# Patient Record
Sex: Male | Born: 1935
Health system: Southern US, Community
[De-identification: ages and names within clinical notes are randomized; demographics above are authoritative.]

## PROBLEM LIST (undated history)

## (undated) DIAGNOSIS — Z87442 Personal history of urinary calculi: Secondary | ICD-10-CM

## (undated) DIAGNOSIS — R51 Headache: Secondary | ICD-10-CM

## (undated) DIAGNOSIS — I2510 Atherosclerotic heart disease of native coronary artery without angina pectoris: Secondary | ICD-10-CM

## (undated) DIAGNOSIS — E119 Type 2 diabetes mellitus without complications: Secondary | ICD-10-CM

## (undated) DIAGNOSIS — Z8249 Family history of ischemic heart disease and other diseases of the circulatory system: Secondary | ICD-10-CM

## (undated) DIAGNOSIS — I4892 Unspecified atrial flutter: Secondary | ICD-10-CM

## (undated) DIAGNOSIS — B029 Zoster without complications: Secondary | ICD-10-CM

## (undated) DIAGNOSIS — R42 Dizziness and giddiness: Secondary | ICD-10-CM

## (undated) DIAGNOSIS — I10 Essential (primary) hypertension: Secondary | ICD-10-CM

## (undated) DIAGNOSIS — M19049 Primary osteoarthritis, unspecified hand: Secondary | ICD-10-CM

## (undated) DIAGNOSIS — K5730 Diverticulosis of large intestine without perforation or abscess without bleeding: Secondary | ICD-10-CM

## (undated) DIAGNOSIS — Z8711 Personal history of peptic ulcer disease: Secondary | ICD-10-CM

## (undated) DIAGNOSIS — C801 Malignant (primary) neoplasm, unspecified: Secondary | ICD-10-CM

## (undated) DIAGNOSIS — M1990 Unspecified osteoarthritis, unspecified site: Secondary | ICD-10-CM

## (undated) DIAGNOSIS — T7840XA Allergy, unspecified, initial encounter: Secondary | ICD-10-CM

## (undated) DIAGNOSIS — E785 Hyperlipidemia, unspecified: Secondary | ICD-10-CM

## (undated) DIAGNOSIS — M109 Gout, unspecified: Secondary | ICD-10-CM

## (undated) DIAGNOSIS — R234 Changes in skin texture: Secondary | ICD-10-CM

## (undated) DIAGNOSIS — IMO0002 Reserved for concepts with insufficient information to code with codable children: Secondary | ICD-10-CM

## (undated) DIAGNOSIS — J209 Acute bronchitis, unspecified: Secondary | ICD-10-CM

## (undated) DIAGNOSIS — I499 Cardiac arrhythmia, unspecified: Secondary | ICD-10-CM

## (undated) DIAGNOSIS — H902 Conductive hearing loss, unspecified: Secondary | ICD-10-CM

## (undated) HISTORY — DX: Primary osteoarthritis, unspecified hand: M19.049

## (undated) HISTORY — PX: EYE SURGERY: SHX253

## (undated) HISTORY — DX: Atherosclerotic heart disease of native coronary artery without angina pectoris: I25.10

## (undated) HISTORY — DX: Headache: R51

## (undated) HISTORY — PX: HERNIA REPAIR: SHX51

## (undated) HISTORY — PX: FRACTURE SURGERY: SHX138

## (undated) HISTORY — DX: Family history of ischemic heart disease and other diseases of the circulatory system: Z82.49

## (undated) HISTORY — DX: Acute bronchitis, unspecified: J20.9

## (undated) HISTORY — DX: Zoster without complications: B02.9

## (undated) HISTORY — PX: JOINT REPLACEMENT: SHX530

## (undated) HISTORY — DX: Reserved for concepts with insufficient information to code with codable children: IMO0002

## (undated) HISTORY — DX: Conductive hearing loss, unspecified: H90.2

## (undated) HISTORY — DX: Hyperlipidemia, unspecified: E78.5

## (undated) HISTORY — DX: Dizziness and giddiness: R42

## (undated) HISTORY — DX: Essential (primary) hypertension: I10

## (undated) HISTORY — DX: Unspecified osteoarthritis, unspecified site: M19.90

## (undated) HISTORY — PX: TOTAL KNEE ARTHROPLASTY: SHX125

## (undated) HISTORY — DX: Personal history of urinary calculi: Z87.442

## (undated) HISTORY — DX: Personal history of peptic ulcer disease: Z87.11

## (undated) HISTORY — DX: Allergy, unspecified, initial encounter: T78.40XA

## (undated) HISTORY — DX: Gout, unspecified: M10.9

## (undated) HISTORY — DX: Type 2 diabetes mellitus without complications: E11.9

## (undated) HISTORY — DX: Diverticulosis of large intestine without perforation or abscess without bleeding: K57.30

---
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
## 1969-09-11 HISTORY — PX: VASECTOMY: SHX75

## 1993-09-11 HISTORY — PX: KIDNEY STONE SURGERY: SHX686

## 2003-09-12 HISTORY — PX: CATARACT EXTRACTION: SUR2

## 2004-08-15 ENCOUNTER — Inpatient Hospital Stay (HOSPITAL_COMMUNITY): Admission: RE | Admit: 2004-08-15 | Discharge: 2004-08-19 | Payer: Self-pay | Admitting: Orthopedic Surgery

## 2004-09-13 ENCOUNTER — Ambulatory Visit: Payer: Self-pay | Admitting: Internal Medicine

## 2004-10-11 ENCOUNTER — Ambulatory Visit: Payer: Self-pay | Admitting: Internal Medicine

## 2004-11-07 ENCOUNTER — Ambulatory Visit (HOSPITAL_COMMUNITY): Admission: RE | Admit: 2004-11-07 | Discharge: 2004-11-07 | Payer: Self-pay | Admitting: Orthopedic Surgery

## 2005-01-05 ENCOUNTER — Ambulatory Visit: Payer: Self-pay | Admitting: Internal Medicine

## 2005-03-07 ENCOUNTER — Ambulatory Visit: Payer: Self-pay | Admitting: Internal Medicine

## 2005-04-26 IMAGING — CR DG CHEST 2V
2 series · 2 of 2 positions shown · non-contrast
Comparison: none

CLINICAL DATA: 77 year old male preoperative respiratory evaluation, right medial meniscal tear.  Prior history of smoking. 
DIAGNOSTIC CHEST ? TWO VIEWS:

[view not recorded (1 of 2)]
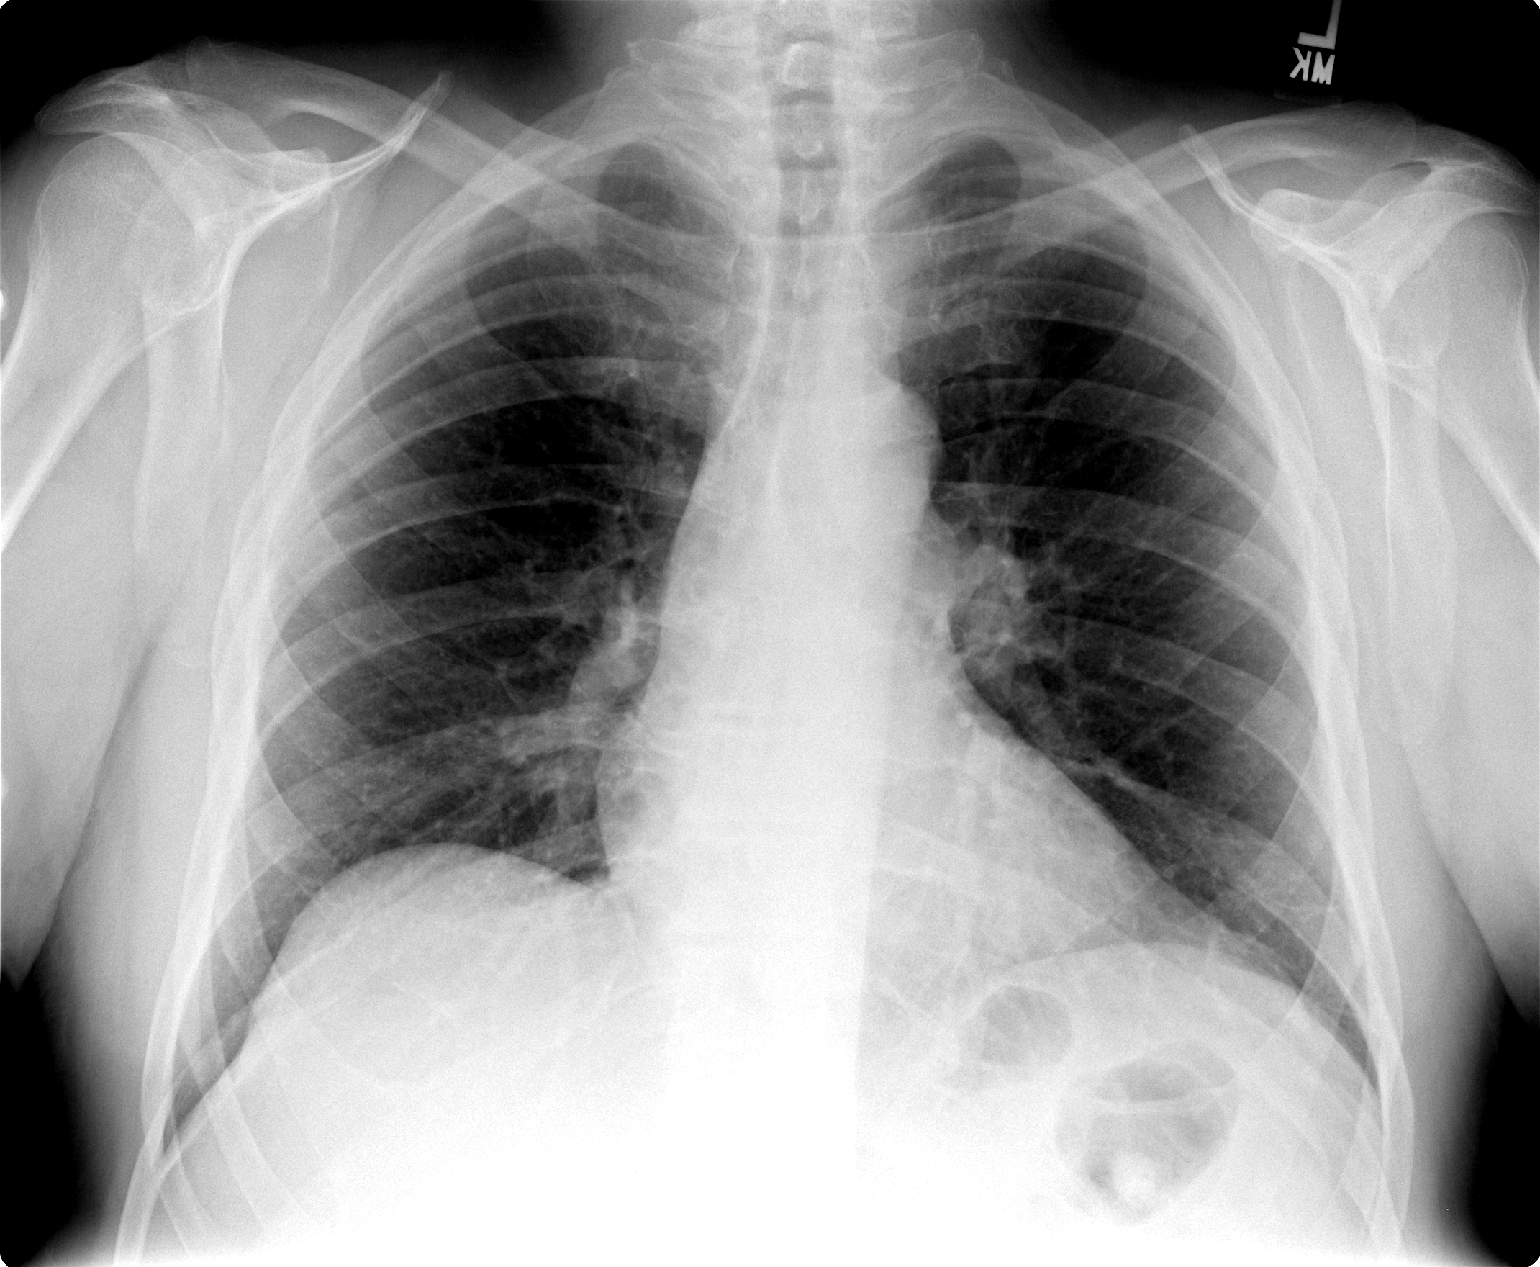

[view not recorded (2 of 2)]
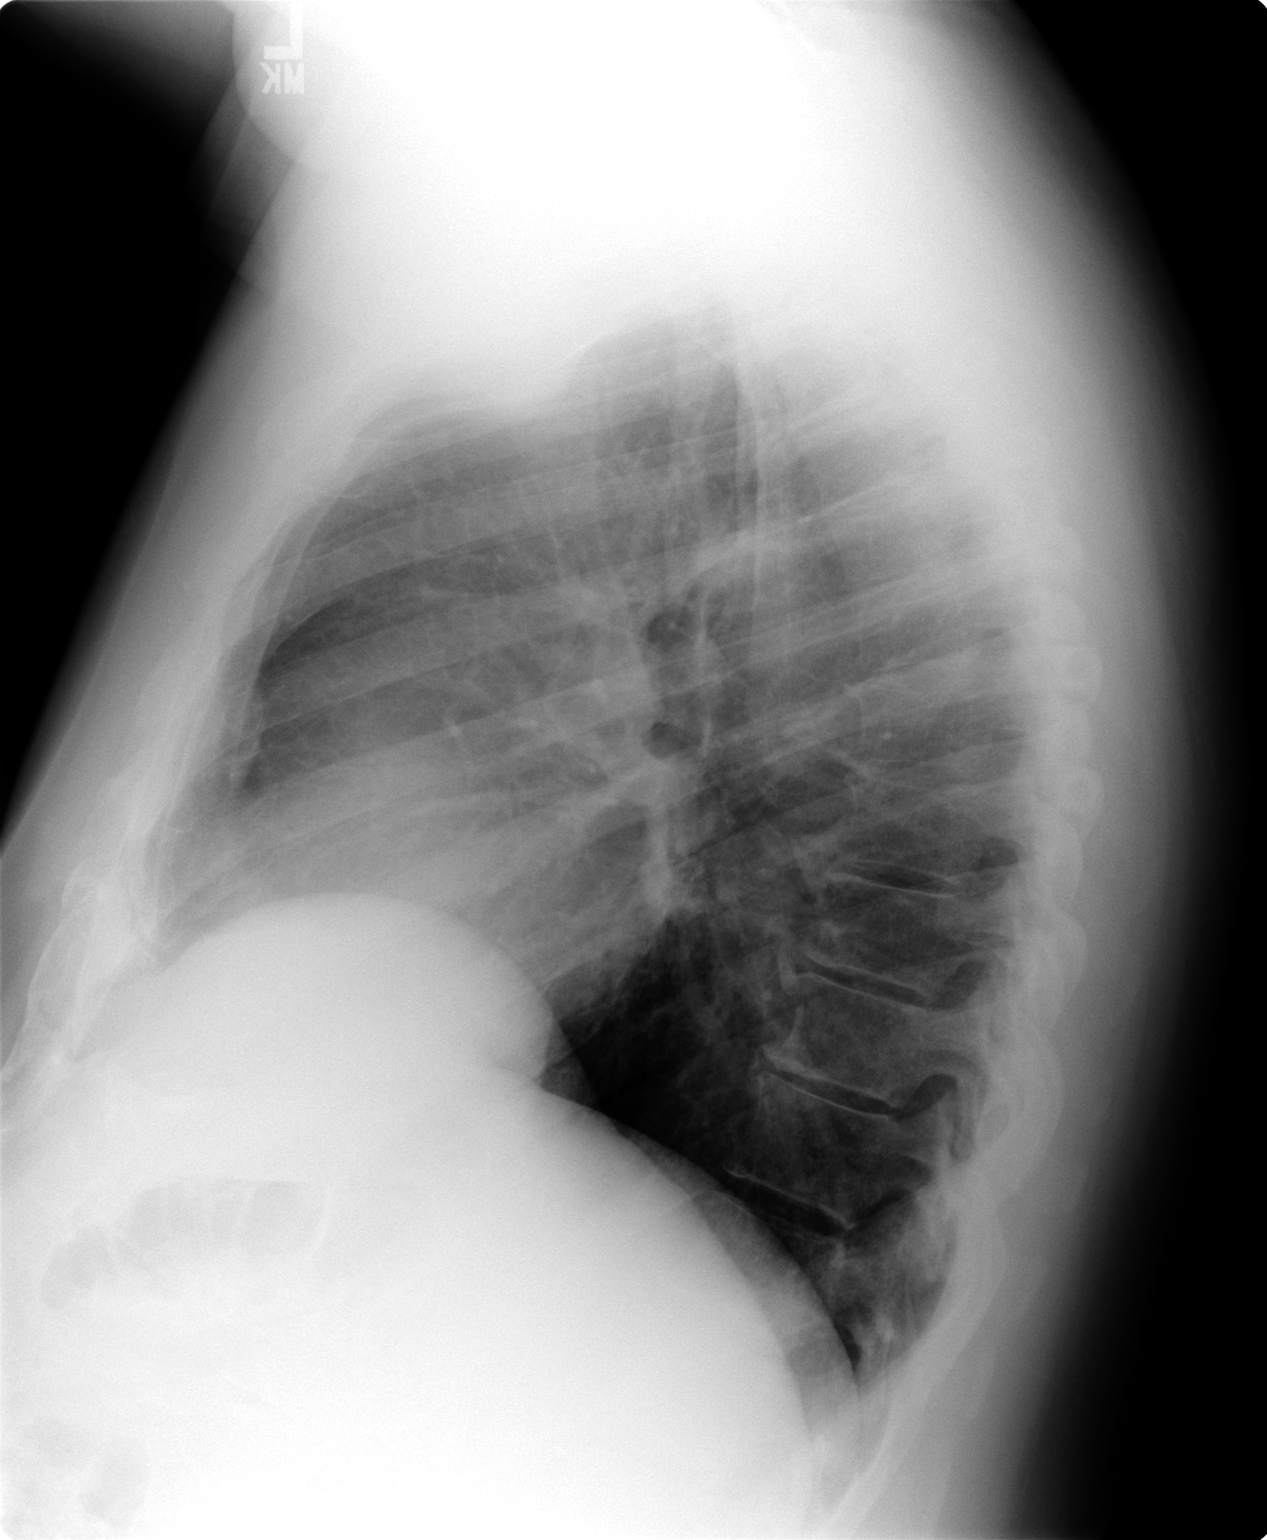

[2 of 2 positions shown; findings below may reference images not displayed]

FINDINGS: The heart size and mediastinal contours are within normal limits.  Both lungs are clear.  The visualized skeletal structures are unremarkable.
There is mild eventration anteriorly of the right hemidiaphragm.
IMPRESSION: No active cardiopulmonary disease.

## 2005-06-05 ENCOUNTER — Ambulatory Visit: Payer: Self-pay | Admitting: Internal Medicine

## 2005-06-09 ENCOUNTER — Ambulatory Visit: Payer: Self-pay | Admitting: Internal Medicine

## 2005-06-28 ENCOUNTER — Ambulatory Visit (HOSPITAL_COMMUNITY): Admission: RE | Admit: 2005-06-28 | Discharge: 2005-06-28 | Payer: Self-pay | Admitting: Ophthalmology

## 2005-07-07 ENCOUNTER — Ambulatory Visit: Payer: Self-pay | Admitting: Internal Medicine

## 2005-10-11 ENCOUNTER — Encounter: Admission: RE | Admit: 2005-10-11 | Discharge: 2005-10-11 | Payer: Self-pay | Admitting: Orthopedic Surgery

## 2005-10-13 ENCOUNTER — Ambulatory Visit (HOSPITAL_BASED_OUTPATIENT_CLINIC_OR_DEPARTMENT_OTHER): Admission: RE | Admit: 2005-10-13 | Discharge: 2005-10-13 | Payer: Self-pay | Admitting: Orthopedic Surgery

## 2005-10-24 ENCOUNTER — Ambulatory Visit: Payer: Self-pay | Admitting: Internal Medicine

## 2006-01-15 ENCOUNTER — Ambulatory Visit: Payer: Self-pay | Admitting: Internal Medicine

## 2006-04-16 ENCOUNTER — Ambulatory Visit: Payer: Self-pay | Admitting: Internal Medicine

## 2006-08-16 ENCOUNTER — Ambulatory Visit: Payer: Self-pay | Admitting: Internal Medicine

## 2006-11-15 ENCOUNTER — Ambulatory Visit: Payer: Self-pay | Admitting: Internal Medicine

## 2007-01-17 ENCOUNTER — Ambulatory Visit: Payer: Self-pay | Admitting: Internal Medicine

## 2007-04-02 DIAGNOSIS — R51 Headache: Secondary | ICD-10-CM

## 2007-04-02 DIAGNOSIS — M1710 Unilateral primary osteoarthritis, unspecified knee: Secondary | ICD-10-CM

## 2007-04-02 DIAGNOSIS — Z87442 Personal history of urinary calculi: Secondary | ICD-10-CM | POA: Insufficient documentation

## 2007-04-02 DIAGNOSIS — J309 Allergic rhinitis, unspecified: Secondary | ICD-10-CM | POA: Insufficient documentation

## 2007-04-02 DIAGNOSIS — I10 Essential (primary) hypertension: Secondary | ICD-10-CM

## 2007-04-02 DIAGNOSIS — E785 Hyperlipidemia, unspecified: Secondary | ICD-10-CM

## 2007-04-02 DIAGNOSIS — M179 Osteoarthritis of knee, unspecified: Secondary | ICD-10-CM | POA: Insufficient documentation

## 2007-04-02 DIAGNOSIS — R519 Headache, unspecified: Secondary | ICD-10-CM | POA: Insufficient documentation

## 2007-04-16 ENCOUNTER — Ambulatory Visit: Payer: Self-pay | Admitting: Internal Medicine

## 2007-04-16 DIAGNOSIS — H902 Conductive hearing loss, unspecified: Secondary | ICD-10-CM | POA: Insufficient documentation

## 2007-05-31 ENCOUNTER — Encounter: Payer: Self-pay | Admitting: Internal Medicine

## 2007-07-22 ENCOUNTER — Ambulatory Visit: Payer: Self-pay | Admitting: Internal Medicine

## 2007-07-22 LAB — CONVERTED CEMR LAB
ALT: 22 units/L (ref 0–53)
AST: 21 units/L (ref 0–37)
Albumin: 4.2 g/dL (ref 3.5–5.2)
Alkaline Phosphatase: 61 units/L (ref 39–117)
BUN: 11 mg/dL (ref 6–23)
Basophils Absolute: 0 10*3/uL (ref 0.0–0.1)
Basophils Relative: 0.8 % (ref 0.0–1.0)
Bilirubin, Direct: 0.3 mg/dL (ref 0.0–0.3)
CO2: 30 meq/L (ref 19–32)
Calcium: 9.6 mg/dL (ref 8.4–10.5)
Chloride: 107 meq/L (ref 96–112)
Cholesterol: 154 mg/dL (ref 0–200)
Creatinine, Ser: 0.9 mg/dL (ref 0.4–1.5)
Eosinophils Absolute: 0.1 10*3/uL (ref 0.0–0.6)
Eosinophils Relative: 1.7 % (ref 0.0–5.0)
GFR calc Af Amer: 105 mL/min
GFR calc non Af Amer: 87 mL/min
Glucose, Bld: 115 mg/dL — ABNORMAL HIGH (ref 70–99)
HCT: 46.1 % (ref 39.0–52.0)
HDL: 28.5 mg/dL — ABNORMAL LOW (ref >39.0)
Hemoglobin: 16 g/dL (ref 13.0–17.0)
LDL Cholesterol: 97 mg/dL (ref 0–99)
Lymphocytes Relative: 39.2 % (ref 12.0–46.0)
MCHC: 34.7 g/dL (ref 30.0–36.0)
MCV: 95.7 fL (ref 78.0–100.0)
Monocytes Absolute: 0.4 10*3/uL (ref 0.2–0.7)
Monocytes Relative: 8.6 % (ref 3.0–11.0)
Neutro Abs: 2.5 10*3/uL (ref 1.4–7.7)
Neutrophils Relative %: 49.7 % (ref 43.0–77.0)
Platelets: 160 10*3/uL (ref 150–400)
Potassium: 4.3 meq/L (ref 3.5–5.1)
RBC: 4.81 M/uL (ref 4.22–5.81)
RDW: 12.4 % (ref 11.5–14.6)
Sodium: 143 meq/L (ref 135–145)
Total Bilirubin: 1.5 mg/dL — ABNORMAL HIGH (ref 0.3–1.2)
Total CHOL/HDL Ratio: 5.4
Total Protein: 6.3 g/dL (ref 6.0–8.3)
Triglycerides: 141 mg/dL (ref 0–149)
VLDL: 28 mg/dL (ref 0–40)
WBC: 5 10*3/uL (ref 4.5–10.5)

## 2007-07-25 ENCOUNTER — Ambulatory Visit: Payer: Self-pay | Admitting: Internal Medicine

## 2007-09-12 HISTORY — PX: OTHER SURGICAL HISTORY: SHX169

## 2007-11-19 ENCOUNTER — Ambulatory Visit: Payer: Self-pay | Admitting: Internal Medicine

## 2007-11-19 DIAGNOSIS — M19049 Primary osteoarthritis, unspecified hand: Secondary | ICD-10-CM | POA: Insufficient documentation

## 2007-11-25 ENCOUNTER — Ambulatory Visit: Payer: Self-pay | Admitting: Gastroenterology

## 2007-12-05 ENCOUNTER — Encounter: Payer: Self-pay | Admitting: Gastroenterology

## 2007-12-05 ENCOUNTER — Ambulatory Visit: Payer: Self-pay | Admitting: Gastroenterology

## 2007-12-05 ENCOUNTER — Encounter: Payer: Self-pay | Admitting: Internal Medicine

## 2007-12-09 ENCOUNTER — Ambulatory Visit: Payer: Self-pay | Admitting: Cardiovascular Disease

## 2007-12-09 ENCOUNTER — Inpatient Hospital Stay (HOSPITAL_COMMUNITY): Admission: EM | Admit: 2007-12-09 | Discharge: 2007-12-11 | Payer: Self-pay | Admitting: Emergency Medicine

## 2007-12-09 ENCOUNTER — Ambulatory Visit: Payer: Self-pay | Admitting: Internal Medicine

## 2007-12-09 IMAGING — CR DG CHEST 1V PORT
1 series · 1 of 1 positions shown · non-contrast
Comparison: [DATE]

CLINICAL DATA: Chest pain

PORTABLE CHEST - 1 VIEW

[AP]
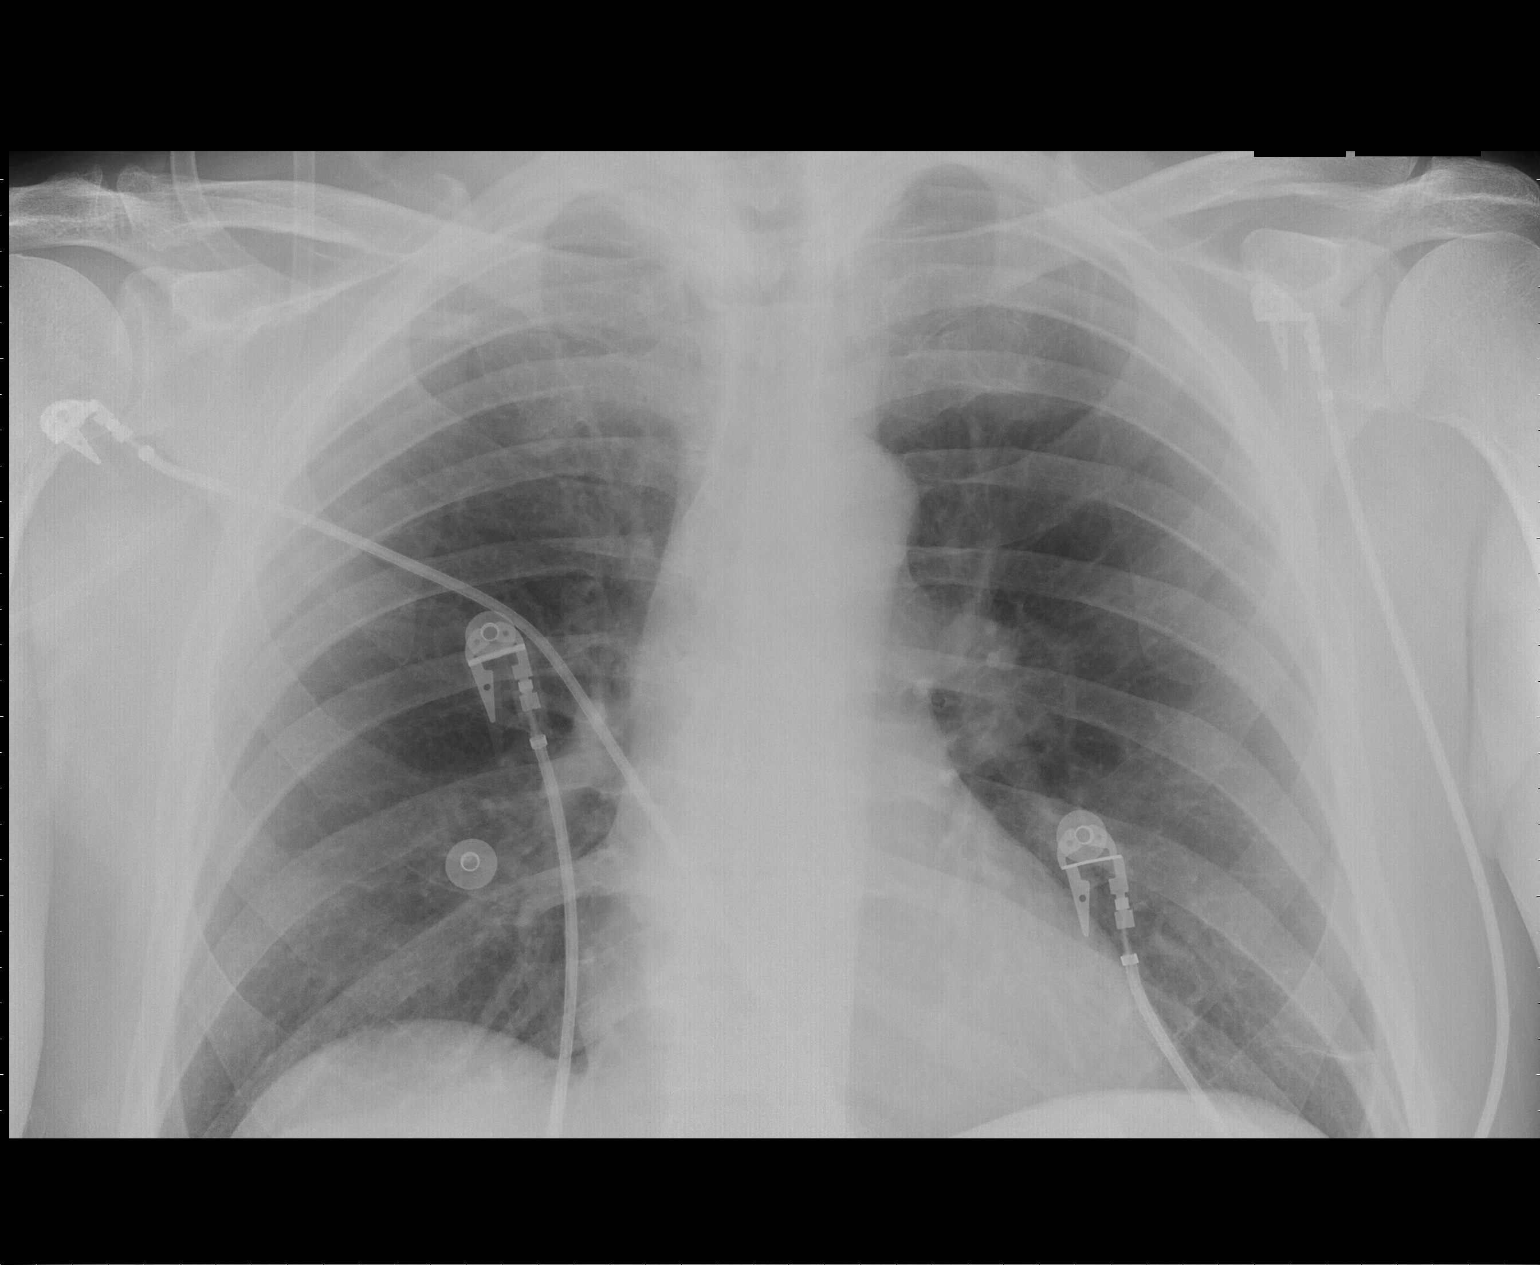

[1 of 1 positions shown; findings below may reference images not displayed]

FINDINGS: The cardiomediastinal silhouette is stable.  There is no
acute infiltrate or pleural effusion.  There is no pulmonary edema.
IMPRESSION: No active cardiopulmonary disease.

## 2007-12-17 ENCOUNTER — Ambulatory Visit: Payer: Self-pay | Admitting: Internal Medicine

## 2007-12-17 DIAGNOSIS — I2510 Atherosclerotic heart disease of native coronary artery without angina pectoris: Secondary | ICD-10-CM

## 2007-12-19 ENCOUNTER — Emergency Department (HOSPITAL_COMMUNITY): Admission: EM | Admit: 2007-12-19 | Discharge: 2007-12-19 | Payer: Self-pay | Admitting: Emergency Medicine

## 2007-12-19 ENCOUNTER — Ambulatory Visit: Payer: Self-pay | Admitting: Internal Medicine

## 2007-12-19 ENCOUNTER — Telehealth: Payer: Self-pay | Admitting: Internal Medicine

## 2007-12-19 ENCOUNTER — Encounter (HOSPITAL_COMMUNITY): Admission: RE | Admit: 2007-12-19 | Discharge: 2008-03-18 | Payer: Self-pay | Admitting: Cardiovascular Disease

## 2008-01-07 ENCOUNTER — Ambulatory Visit: Payer: Self-pay | Admitting: Cardiovascular Disease

## 2008-01-15 ENCOUNTER — Ambulatory Visit: Payer: Self-pay | Admitting: Internal Medicine

## 2008-01-15 LAB — CONVERTED CEMR LAB
ALT: 20 units/L (ref 0–53)
AST: 20 units/L (ref 0–37)
Albumin: 4.2 g/dL (ref 3.5–5.2)
Alkaline Phosphatase: 72 units/L (ref 39–117)
BUN: 17 mg/dL (ref 6–23)
Bilirubin, Direct: 0.1 mg/dL (ref 0.0–0.3)
CO2: 32 meq/L (ref 19–32)
Calcium: 9.6 mg/dL (ref 8.4–10.5)
Chloride: 110 meq/L (ref 96–112)
Cholesterol: 153 mg/dL (ref 0–200)
Creatinine, Ser: 1 mg/dL (ref 0.4–1.5)
Direct LDL: 82.5 mg/dL
GFR calc Af Amer: 93 mL/min
GFR calc non Af Amer: 77 mL/min
Glucose, Bld: 105 mg/dL — ABNORMAL HIGH (ref 70–99)
HDL: 25.4 mg/dL — ABNORMAL LOW (ref >39.0)
Potassium: 4.3 meq/L (ref 3.5–5.1)
Sodium: 146 meq/L — ABNORMAL HIGH (ref 135–145)
Total Bilirubin: 1.1 mg/dL (ref 0.3–1.2)
Total CHOL/HDL Ratio: 6
Total Protein: 6.4 g/dL (ref 6.0–8.3)
Triglycerides: 229 mg/dL (ref 0–149)
VLDL: 46 mg/dL — ABNORMAL HIGH (ref 0–40)

## 2008-01-23 ENCOUNTER — Ambulatory Visit: Payer: Self-pay | Admitting: Internal Medicine

## 2008-03-05 ENCOUNTER — Ambulatory Visit: Payer: Self-pay | Admitting: Internal Medicine

## 2008-03-05 LAB — CONVERTED CEMR LAB
ALT: 25 units/L (ref 0–53)
AST: 24 units/L (ref 0–37)
Albumin: 4.2 g/dL (ref 3.5–5.2)
Alkaline Phosphatase: 73 units/L (ref 39–117)
Bilirubin, Direct: 0.2 mg/dL (ref 0.0–0.3)
Cholesterol: 133 mg/dL (ref 0–200)
HDL: 27.5 mg/dL — ABNORMAL LOW (ref >39.0)
LDL Cholesterol: 76 mg/dL (ref 0–99)
Total Bilirubin: 1.2 mg/dL (ref 0.3–1.2)
Total CHOL/HDL Ratio: 4.8
Total Protein: 6.4 g/dL (ref 6.0–8.3)
Triglycerides: 146 mg/dL (ref 0–149)
VLDL: 29 mg/dL (ref 0–40)

## 2008-03-12 ENCOUNTER — Ambulatory Visit: Payer: Self-pay | Admitting: Internal Medicine

## 2008-03-12 LAB — CONVERTED CEMR LAB
Cholesterol, target level: 200 mg/dL
HDL goal, serum: 40 mg/dL
LDL Goal: 100 mg/dL

## 2008-03-16 ENCOUNTER — Encounter: Payer: Self-pay | Admitting: Internal Medicine

## 2008-04-14 ENCOUNTER — Encounter: Payer: Self-pay | Admitting: Internal Medicine

## 2008-05-06 ENCOUNTER — Ambulatory Visit: Payer: Self-pay | Admitting: Internal Medicine

## 2008-05-06 LAB — CONVERTED CEMR LAB
ALT: 23 units/L (ref 0–53)
AST: 21 units/L (ref 0–37)
Albumin: 4 g/dL (ref 3.5–5.2)
Alkaline Phosphatase: 83 units/L (ref 39–117)
Bilirubin, Direct: 0.2 mg/dL (ref 0.0–0.3)
Cholesterol: 139 mg/dL (ref 0–200)
HDL: 24.2 mg/dL — ABNORMAL LOW (ref >39.0)
LDL Cholesterol: 93 mg/dL (ref 0–99)
Total Bilirubin: 1 mg/dL (ref 0.3–1.2)
Total CHOL/HDL Ratio: 5.7
Total Protein: 6.1 g/dL (ref 6.0–8.3)
Triglycerides: 108 mg/dL (ref 0–149)
VLDL: 22 mg/dL (ref 0–40)

## 2008-05-13 ENCOUNTER — Ambulatory Visit: Payer: Self-pay | Admitting: Internal Medicine

## 2008-06-26 ENCOUNTER — Ambulatory Visit: Payer: Self-pay | Admitting: Cardiovascular Disease

## 2008-07-24 ENCOUNTER — Ambulatory Visit: Payer: Self-pay | Admitting: Internal Medicine

## 2008-07-24 ENCOUNTER — Telehealth: Payer: Self-pay | Admitting: Internal Medicine

## 2008-09-17 ENCOUNTER — Ambulatory Visit: Payer: Self-pay | Admitting: Internal Medicine

## 2008-10-13 ENCOUNTER — Encounter: Payer: Self-pay | Admitting: Internal Medicine

## 2008-10-16 ENCOUNTER — Encounter: Payer: Self-pay | Admitting: Internal Medicine

## 2008-12-15 DIAGNOSIS — K219 Gastro-esophageal reflux disease without esophagitis: Secondary | ICD-10-CM

## 2008-12-15 DIAGNOSIS — Z8711 Personal history of peptic ulcer disease: Secondary | ICD-10-CM

## 2008-12-15 DIAGNOSIS — R42 Dizziness and giddiness: Secondary | ICD-10-CM

## 2008-12-15 DIAGNOSIS — K5730 Diverticulosis of large intestine without perforation or abscess without bleeding: Secondary | ICD-10-CM | POA: Insufficient documentation

## 2008-12-15 HISTORY — DX: Personal history of peptic ulcer disease: Z87.11

## 2008-12-28 ENCOUNTER — Ambulatory Visit: Payer: Self-pay | Admitting: Cardiovascular Disease

## 2008-12-28 ENCOUNTER — Encounter: Payer: Self-pay | Admitting: Cardiovascular Disease

## 2008-12-28 DIAGNOSIS — I4510 Unspecified right bundle-branch block: Secondary | ICD-10-CM | POA: Insufficient documentation

## 2009-01-15 ENCOUNTER — Ambulatory Visit: Payer: Self-pay | Admitting: Internal Medicine

## 2009-01-15 LAB — CONVERTED CEMR LAB
Basophils Absolute: 0 10*3/uL (ref 0.0–0.1)
Basophils Relative: 0.8 % (ref 0.0–3.0)
Cholesterol: 139 mg/dL (ref 0–200)
Direct LDL: 78.7 mg/dL
Eosinophils Absolute: 0.1 10*3/uL (ref 0.0–0.7)
Eosinophils Relative: 2.1 % (ref 0.0–5.0)
HCT: 43.9 % (ref 39.0–52.0)
HDL: 33.1 mg/dL — ABNORMAL LOW (ref >39.00)
Hemoglobin: 15.3 g/dL (ref 13.0–17.0)
Lymphocytes Relative: 34.2 % (ref 12.0–46.0)
Lymphs Abs: 1.5 10*3/uL (ref 0.7–4.0)
MCHC: 34.9 g/dL (ref 30.0–36.0)
MCV: 94.5 fL (ref 78.0–100.0)
Monocytes Absolute: 0.3 10*3/uL (ref 0.1–1.0)
Monocytes Relative: 7.9 % (ref 3.0–12.0)
Neutro Abs: 2.4 10*3/uL (ref 1.4–7.7)
Neutrophils Relative %: 55 % (ref 43.0–77.0)
Platelets: 128 10*3/uL — ABNORMAL LOW (ref 150.0–400.0)
RBC: 4.64 M/uL (ref 4.22–5.81)
RDW: 12.7 % (ref 11.5–14.6)
TSH: 1.22 microintl units/mL (ref 0.35–5.50)
WBC: 4.3 10*3/uL — ABNORMAL LOW (ref 4.5–10.5)

## 2009-01-25 ENCOUNTER — Telehealth: Payer: Self-pay | Admitting: Internal Medicine

## 2009-01-25 ENCOUNTER — Ambulatory Visit: Payer: Self-pay | Admitting: Internal Medicine

## 2009-01-25 DIAGNOSIS — B029 Zoster without complications: Secondary | ICD-10-CM | POA: Insufficient documentation

## 2009-01-25 HISTORY — DX: Zoster without complications: B02.9

## 2009-02-16 ENCOUNTER — Telehealth: Payer: Self-pay | Admitting: Internal Medicine

## 2009-04-13 ENCOUNTER — Encounter: Payer: Self-pay | Admitting: Internal Medicine

## 2009-05-14 ENCOUNTER — Telehealth: Payer: Self-pay | Admitting: Internal Medicine

## 2009-06-08 ENCOUNTER — Ambulatory Visit: Payer: Self-pay | Admitting: Internal Medicine

## 2009-06-08 DIAGNOSIS — N401 Enlarged prostate with lower urinary tract symptoms: Secondary | ICD-10-CM | POA: Insufficient documentation

## 2009-06-08 DIAGNOSIS — R351 Nocturia: Secondary | ICD-10-CM

## 2009-06-28 ENCOUNTER — Telehealth: Payer: Self-pay | Admitting: Cardiovascular Disease

## 2009-09-14 ENCOUNTER — Ambulatory Visit: Payer: Self-pay | Admitting: Internal Medicine

## 2009-09-27 ENCOUNTER — Encounter: Payer: Self-pay | Admitting: Internal Medicine

## 2009-11-22 ENCOUNTER — Ambulatory Visit: Payer: Self-pay | Admitting: Internal Medicine

## 2009-11-22 DIAGNOSIS — J209 Acute bronchitis, unspecified: Secondary | ICD-10-CM

## 2009-11-22 HISTORY — DX: Acute bronchitis, unspecified: J20.9

## 2010-01-11 ENCOUNTER — Ambulatory Visit: Payer: Self-pay | Admitting: Cardiovascular Disease

## 2010-01-20 ENCOUNTER — Encounter: Payer: Self-pay | Admitting: Cardiovascular Disease

## 2010-01-20 ENCOUNTER — Telehealth: Payer: Self-pay | Admitting: Cardiovascular Disease

## 2010-01-31 ENCOUNTER — Encounter: Payer: Self-pay | Admitting: Internal Medicine

## 2010-03-01 ENCOUNTER — Ambulatory Visit: Payer: Self-pay | Admitting: Internal Medicine

## 2010-03-01 LAB — CONVERTED CEMR LAB
BUN: 18 mg/dL (ref 6–23)
CO2: 28 meq/L (ref 19–32)
Calcium: 9.6 mg/dL (ref 8.4–10.5)
Chloride: 105 meq/L (ref 96–112)
Cholesterol: 147 mg/dL (ref 0–200)
Creatinine, Ser: 1 mg/dL (ref 0.4–1.5)
Direct LDL: 81 mg/dL
GFR calc non Af Amer: 81.14 mL/min (ref >60)
Glucose, Bld: 114 mg/dL — ABNORMAL HIGH (ref 70–99)
HDL: 38.4 mg/dL — ABNORMAL LOW (ref >39.00)
Potassium: 4 meq/L (ref 3.5–5.1)
Sodium: 143 meq/L (ref 135–145)
TSH: 0.91 microintl units/mL (ref 0.35–5.50)

## 2010-05-12 ENCOUNTER — Encounter: Payer: Self-pay | Admitting: Internal Medicine

## 2010-06-07 ENCOUNTER — Telehealth: Payer: Self-pay | Admitting: Internal Medicine

## 2010-06-16 ENCOUNTER — Encounter: Payer: Self-pay | Admitting: Internal Medicine

## 2010-06-30 ENCOUNTER — Telehealth: Payer: Self-pay | Admitting: Cardiovascular Disease

## 2010-07-20 ENCOUNTER — Ambulatory Visit: Payer: Self-pay | Admitting: Internal Medicine

## 2010-07-20 ENCOUNTER — Encounter: Payer: Self-pay | Admitting: Internal Medicine

## 2010-07-20 DIAGNOSIS — I495 Sick sinus syndrome: Secondary | ICD-10-CM | POA: Insufficient documentation

## 2010-07-20 LAB — CONVERTED CEMR LAB
BUN: 18 mg/dL (ref 6–23)
Basophils Absolute: 0 10*3/uL (ref 0.0–0.1)
Basophils Relative: 0.5 % (ref 0.0–3.0)
CO2: 27 meq/L (ref 19–32)
Calcium: 10 mg/dL (ref 8.4–10.5)
Chloride: 108 meq/L (ref 96–112)
Creatinine, Ser: 0.9 mg/dL (ref 0.4–1.5)
Eosinophils Absolute: 0.1 10*3/uL (ref 0.0–0.7)
Eosinophils Relative: 1.5 % (ref 0.0–5.0)
GFR calc non Af Amer: 88.55 mL/min (ref >60)
Glucose, Bld: 113 mg/dL — ABNORMAL HIGH (ref 70–99)
HCT: 43.7 % (ref 39.0–52.0)
Hemoglobin: 15.4 g/dL (ref 13.0–17.0)
Lymphocytes Relative: 33.2 % (ref 12.0–46.0)
Lymphs Abs: 1.8 10*3/uL (ref 0.7–4.0)
MCHC: 35.1 g/dL (ref 30.0–36.0)
MCV: 94.4 fL (ref 78.0–100.0)
Monocytes Absolute: 0.4 10*3/uL (ref 0.1–1.0)
Monocytes Relative: 6.5 % (ref 3.0–12.0)
Neutro Abs: 3.2 10*3/uL (ref 1.4–7.7)
Neutrophils Relative %: 58.3 % (ref 43.0–77.0)
Platelets: 177 10*3/uL (ref 150.0–400.0)
Potassium: 4.3 meq/L (ref 3.5–5.1)
RBC: 4.63 M/uL (ref 4.22–5.81)
RDW: 13.2 % (ref 11.5–14.6)
Sodium: 142 meq/L (ref 135–145)
TSH: 1.11 microintl units/mL (ref 0.35–5.50)
WBC: 5.5 10*3/uL (ref 4.5–10.5)

## 2010-07-27 ENCOUNTER — Telehealth (INDEPENDENT_AMBULATORY_CARE_PROVIDER_SITE_OTHER): Payer: Self-pay | Admitting: *Deleted

## 2010-07-28 ENCOUNTER — Ambulatory Visit: Payer: Self-pay

## 2010-08-25 ENCOUNTER — Ambulatory Visit: Payer: Self-pay | Admitting: Cardiovascular Disease

## 2010-09-06 ENCOUNTER — Encounter: Payer: Self-pay | Admitting: Internal Medicine

## 2010-09-14 ENCOUNTER — Telehealth: Payer: Self-pay | Admitting: Internal Medicine

## 2010-09-21 ENCOUNTER — Ambulatory Visit
Admission: RE | Admit: 2010-09-21 | Discharge: 2010-09-21 | Payer: Self-pay | Source: Home / Self Care | Attending: Cardiovascular Disease | Admitting: Cardiovascular Disease

## 2010-09-21 ENCOUNTER — Ambulatory Visit: Admission: RE | Admit: 2010-09-21 | Discharge: 2010-09-21 | Payer: Self-pay | Source: Home / Self Care

## 2010-09-26 ENCOUNTER — Ambulatory Visit
Admission: RE | Admit: 2010-09-26 | Discharge: 2010-09-26 | Payer: Self-pay | Source: Home / Self Care | Attending: Internal Medicine | Admitting: Internal Medicine

## 2010-10-11 NOTE — Assessment & Plan Note (Signed)
Summary: f1y/dm   History of Present Illness: Sean Lane has known coronary artery disease.  He had angioplasty and stent in the obtuse marginal branch in March 2009.  Denied any significant recurrent chest pain.  Slight being on Plavix for a year will continue it.  He did get a drug-eluting stent.  His LDL was 75 when last checked in May of 09 by Dr. Lovell Sheehan he is due to have a repeat.  He has a chronic right bundle branch block which is stable.  No palpitations syncope or evidence of high-grade heart block.  His been compliant with his medications.  He had previous dizziness on higher dose Benicar.  On 10 mg a day it is gone.  He has no further postural symptoms. He continues to be active doing home projects  Current Problems (verified): 1)  Bronchitis, Acute With Mild Bronchospasm  (ICD-466.0) 2)  Benign Prostatic Hypertrophy, With Obstruction  (ICD-600.01) 3)  Herpes Zoster  (ICD-053.9) 4)  Osteoarthritis, Hands, Bilateral  (ICD-715.94) 5)  Rbbb  (ICD-426.4) 6)  Cad  (ICD-414.00) 7)  Hypertension  (ICD-401.9) 8)  Hyperlipidemia  (ICD-272.4) 9)  Peptic Ulcer Disease, Hx of  (ICD-V12.71) 10)  Diverticulosis, Mild  (ICD-562.10) 11)  Gerd  (ICD-530.81) 12)  Dizziness  (ICD-780.4) 13)  Conductive Hearing Loss External Ear  (ICD-389.01) 14)  Degenerative Joint Disease, Hands  (ICD-715.94) 15)  Loss, Conductive Hearing, 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 One  By Mouth Daily 2)  Multivitamins   Tabs (Multiple Vitamin) .... Once Daily 3)  Bayer Aspirin 325 Mg Tabs (Aspirin) .... Take 1 By Mouth Once Daily 4)  Claritin 10 Mg  Tabs (Loratadine) .... As Needed 5)  Ultravate 0.05 %  Crea (Halobetasol  Propionate) .... Apply As Directed 6)  Plavix 75 Mg  Tabs (Clopidogrel Bisulfate) .... Once Daily 7)  Zantac 150 Mg Tabs (Ranitidine Hcl) .Marland Kitchen.. 1 Once Daily 8)  Benicar 20 Mg  Tabs (Olmesartan Medoxomil) .... 1/2 By Mouth Daily.Marland Kitchen...which Is 10mg  Daily 9)  Fish Oil 1200 .... Two By Mouth Bid 10)  Imitrex 25 Mg Tabs (Sumatriptan Succinate) .... As Needed 11)  Avodart 0.5 Mg Caps (Dutasteride) .Marland Kitchen.. 1 Once Daily Alternating W Finasteride 12)  Celexa 10 Mg Tabs (Citalopram Hydrobromide) .Marland Kitchen.. 1 Once Daily  Allergies (verified): 1)  ! Sulfa 2)  ! Dilaudid (Hydromorphone Hcl)  Past History:  Past Medical History: Last updated: 01/02/2009 Current Problems:  CAD (ICD-414.00) HYPERTENSION (ICD-401.9) HYPERLIPIDEMIA (ICD-272.4) PEPTIC ULCER DISEASE, HX OF (ICD-V12.71) DIVERTICULOSIS, MILD (ICD-562.10) GERD (ICD-530.81) DIZZINESS (ICD-780.4) CONDUCTIVE HEARING LOSS EXTERNAL EAR (ICD-389.01) DEGENERATIVE JOINT DISEASE, HANDS (ICD-715.94) LOSS, CONDUCTIVE HEARING, COMBINED TYPE (ICD-389.08) FAMILY HISTORY OF CAD MALE 1ST DEGREE RELATIVE <50 (ICD-V17.3) HX, PERSONAL, URINARY CALCULI (ICD-V13.01) HEADACHE (ICD-784.0) OSTEOARTHRITIS (ICD-715.90) ALLERGIC RHINITIS (ICD-477.9)  Past Surgical History: Last updated: 04/16/2007 Total knee replacement Cataract extraction arthoscopic surgery right knee  Family History: Last updated: Jan 02, 2009 father dies of CAD at age 13 Family History of CAD Male 1st degree relative <50 mother died at 48 of colon obstruction  Social History: Last updated: 12/28/2008 Married Former Smoker quit at age 26 Retired [place]  Review of Systems       Denies fever, malais, weight loss,  blurry vision, decreased visual acuity, cough, sputum, SOB, hemoptysis, pleuritic pain, palpitaitons, heartburn, abdominal pain, melena, lower extremity edema, claudication, or rash.   Vital Signs:  Patient profile:   77 year old male Height:      73  inches Weight:      212 pounds BMI:     28.07 Pulse rate:   57 / minute Resp:     14 per minute BP sitting:   120 / 80  (left arm)  Vitals Entered By: Kem Parkinson (Jan 11, 2010 2:14 PM)  Physical Exam  General:  Affect appropriate Healthy:  appears stated age HEENT: normal Neck supple with no adenopathy JVP normal no bruits no thyromegaly Lungs clear with no wheezing and good diaphragmatic motion Heart:  S1/S2 no murmur,rub, gallop or click PMI normal Abdomen: benighn, BS positve, no tenderness, no AAA no bruit.  No HSM or HJR Distal pulses intact with no bruits No edema Neuro non-focal Skin warm and dry    Impression & Recommendations:  Problem # 1:  RBBB (ICD-426.4) Stable no evidence of high grade heart block His updated medication list for this problem includes:    Bayer Aspirin 325 Mg Tabs (Aspirin) .Marland Kitchen... Take 1 by mouth once daily    Plavix 75 Mg Tabs (Clopidogrel bisulfate) ..... Once daily  Problem # 2:  CAD (ICD-414.00) Stable no angina.  Continue antiplatlet Rx His updated medication list for this problem includes:    Bayer Aspirin 325 Mg Tabs (Aspirin) .Marland Kitchen... Take 1 by mouth once daily    Plavix 75 Mg Tabs (Clopidogrel bisulfate) ..... Once daily  Problem # 3:  HYPERTENSION (ICD-401.9) Well controlled His updated medication list for this problem includes:    Bayer Aspirin 325 Mg Tabs (Aspirin) .Marland Kitchen... Take 1 by mouth once daily    Benicar 20 Mg Tabs (Olmesartan medoxomil) .Marland Kitchen... 1/2 by mouth daily.Marland Kitchen...which is 10mg  daily  Problem # 4:  HYPERLIPIDEMIA (ICD-272.4) At goal with no side effects His updated medication list for this problem includes:    Lipitor 10 Mg Tabs (Atorvastatin calcium) ..... One  by mouth daily  CHOL: 139 (01/15/2009)   LDL: 93 (05/06/2008)   HDL: 33.10 (01/15/2009)   TG: 108 (05/06/2008) CHOL (goal): 200 (03/12/2008)   LDL (goal): 100 (03/12/2008)   HDL (goal): 40 (03/12/2008)   TG (goal): 150 (03/12/2008)  Patient  Instructions: 1)  Your physician recommends that you schedule a follow-up appointment in: ONE YEAR   EKG Report  Procedure date:  01/11/2010  Findings:      NSR 57 RBBB Abnormal ECG

## 2010-10-11 NOTE — Assessment & Plan Note (Signed)
Summary: 3 month rov/njr   Vital Signs:  Patient profile:   77 year old male Height:      73 inches Weight:      212 pounds BMI:     28.07 Temp:     98.2 degrees F oral Pulse rate:   72 / minute Pulse rhythm:   irregular Resp:     14 per minute BP sitting:   124 / 72  (left arm)  Vitals Entered By: Willy Eddy, LPN (September 14, 2009 1:29 PM) CC: roa, Hypertension Management, Lipid Management, Abdominal Pain   CC:  roa, Hypertension Management, Lipid Management, and Abdominal Pain.  History of Present Illness: follow up visit   stable weight and blood pressure with chest pains or SOB Mostely rib and chest wall discomforts MONITERING : LIPIDS, htn AND gerd  Dyspepsia History:      He has no alarm features of dyspepsia including no history of melena, hematochezia, dysphagia, persistent vomiting, or involuntary weight loss > 5%.  There is a prior history of GERD.  The patient has a prior history of documented ulcer disease.  An H-2 blocker medication is currently being taken.    Hypertension History:      He denies headache, chest pain, palpitations, dyspnea with exertion, orthopnea, PND, peripheral edema, visual symptoms, neurologic problems, syncope, and side effects from treatment.        Positive major cardiovascular risk factors include male age 35 years old or older, hyperlipidemia, and hypertension.  Negative major cardiovascular risk factors include negative family history for ischemic heart disease and non-tobacco-user status.        Positive history for target organ damage include ASHD (either angina/prior MI/prior CABG).    Lipid Management History:      Positive NCEP/ATP III risk factors include male age 29 years old or older, HDL cholesterol less than 40, hypertension, and ASHD (either angina/prior MI/prior CABG).  Negative NCEP/ATP III risk factors include no family history for ischemic heart disease and non-tobacco-user status.       Preventive  Screening-Counseling & Management  Alcohol-Tobacco     Smoking Status: quit     Year Quit: 1963     Passive Smoke Exposure: no  Problems Prior to Update: 1)  Benign Prostatic Hypertrophy, With Obstruction  (ICD-600.01) 2)  Herpes Zoster  (ICD-053.9) 3)  Osteoarthritis, Hands, Bilateral  (ICD-715.94) 4)  Rbbb  (ICD-426.4) 5)  Cad  (ICD-414.00) 6)  Hypertension  (ICD-401.9) 7)  Hyperlipidemia  (ICD-272.4) 8)  Peptic Ulcer Disease, Hx of  (ICD-V12.71) 9)  Diverticulosis, Mild  (ICD-562.10) 10)  Gerd  (ICD-530.81) 11)  Dizziness  (ICD-780.4) 12)  Conductive Hearing Loss External Ear  (ICD-389.01) 13)  Degenerative Joint Disease, Hands  (ICD-715.94) 14)  Loss, Conductive Hearing, Combined Type  (ICD-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 One  By Mouth Daily 2)  Multivitamins   Tabs (Multiple Vitamin) .... Once Daily 3)  Bayer Aspirin 325 Mg Tabs (Aspirin) .... Take 1 By Mouth Once Daily 4)  Claritin 10 Mg  Tabs (Loratadine) .... As Needed 5)  Ultravate 0.05 %  Crea (Halobetasol Propionate) .... Apply As Directed 6)  Plavix 75 Mg  Tabs (Clopidogrel Bisulfate) .... Once Daily 7)  Zantac 150 Mg Tabs (Ranitidine Hcl) .Marland Kitchen.. 1 Once Daily 8)  Benicar 20 Mg  Tabs (Olmesartan Medoxomil) .... 1/2 By Mouth Daily 9)  Fish Oil 1200 .... Two By Mouth Bid 10)  Imitrex 25 Mg Tabs (Sumatriptan Succinate) .... As Needed 11)  Finasteride 5 Mg Tabs (Finasteride) .... One By Mouth Daily  Current Medications (verified): 1)  Lipitor 10 Mg  Tabs (Atorvastatin Calcium) .... One  By Mouth Daily 2)  Multivitamins   Tabs (Multiple Vitamin) .... Once Daily 3)  Bayer Aspirin 325 Mg Tabs (Aspirin) .... Take 1 By Mouth Once Daily 4)  Claritin 10 Mg   Tabs (Loratadine) .... As Needed 5)  Ultravate 0.05 %  Crea (Halobetasol Propionate) .... Apply As Directed 6)  Plavix 75 Mg  Tabs (Clopidogrel Bisulfate) .... Once Daily 7)  Zantac 150 Mg Tabs (Ranitidine Hcl) .Marland Kitchen.. 1 Once Daily 8)  Benicar 20 Mg  Tabs (Olmesartan Medoxomil) .... 1/2 By Mouth Daily 9)  Fish Oil 1200 .... Two By Mouth Bid 10)  Imitrex 25 Mg Tabs (Sumatriptan Succinate) .... As Needed 11)  Avodart 0.5 Mg Caps (Dutasteride) .Marland Kitchen.. 1 Once Daily Alternating W Finasteride  Allergies (verified): 1)  ! Sulfa 2)  ! Dilaudid (Hydromorphone Hcl)  Past History:  Family History: Last updated: 01/12/2009 father dies of CAD at age 19 Family History of CAD Male 1st degree relative <50 mother died at 62 of colon obstruction  Social History: Last updated: 12/28/2008 Married Former Smoker quit at age 47 Retired [place]  Risk Factors: Smoking Status: quit (09/14/2009) Passive Smoke Exposure: no (09/14/2009)  Past medical, surgical, family and social histories (including risk factors) reviewed, and no changes noted (except as noted below).  Past Medical History: Reviewed history from 01-12-09 and no changes required. Current Problems:  CAD (ICD-414.00) HYPERTENSION (ICD-401.9) HYPERLIPIDEMIA (ICD-272.4) PEPTIC ULCER DISEASE, HX OF (ICD-V12.71) DIVERTICULOSIS, MILD (ICD-562.10) GERD (ICD-530.81) DIZZINESS (ICD-780.4) CONDUCTIVE HEARING LOSS EXTERNAL EAR (ICD-389.01) DEGENERATIVE JOINT DISEASE, HANDS (ICD-715.94) LOSS, CONDUCTIVE HEARING, COMBINED TYPE (ICD-389.08) FAMILY HISTORY OF CAD MALE 1ST DEGREE RELATIVE <50 (ICD-V17.3) HX, PERSONAL, URINARY CALCULI (ICD-V13.01) HEADACHE (ICD-784.0) OSTEOARTHRITIS (ICD-715.90) ALLERGIC RHINITIS (ICD-477.9)  Past Surgical History: Reviewed history from 04/16/2007 and no changes required. Total knee replacement Cataract extraction arthoscopic surgery right knee  Family History: Reviewed history from  Jan 12, 2009 and no changes required. father dies of CAD at age 21 Family History of CAD Male 1st degree relative <50 mother died at 6 of colon obstruction  Social History: Reviewed history from 12/28/2008 and no changes required. Married Former Smoker quit at age 76 Retired [place] time [place]  Review of Systems  The patient denies anorexia, fever, weight loss, weight gain, vision loss, decreased hearing, hoarseness, chest pain, syncope, dyspnea on exertion, peripheral edema, prolonged cough, headaches, hemoptysis, abdominal pain, melena, hematochezia, severe indigestion/heartburn, hematuria, incontinence, genital sores, muscle weakness, suspicious skin lesions, transient blindness, difficulty walking, depression, unusual weight change, abnormal bleeding, enlarged lymph nodes, angioedema, and breast masses.    Physical Exam  General:  Affect appropriate Healthy:  appears stated age HEENT: normal Neck supple with no adenopathy JVP normal no bruits no thyromegaly Lungs clear with no wheezing and good diaphragmatic motion Heart:  S1/S2 no murmur,rub, gallop or click PMI normal Abdomen: benighn, BS positve, no tenderness, no AAA no bruit.  No HSM or HJR Distal pulses intact with no bruits No edema Neuro non-focal Skin warm and dry  Head:  normocephalic and male-pattern balding.   Eyes:  pupils equal and  pupils round.   Nose:  no external deformity.   Mouth:  pharynx pink and moist and no erythema.   Neck:  No deformities, masses, or tenderness noted. Lungs:  normal respiratory effort and no wheezes.   Heart:  normal rate and regular rhythm.   Abdomen:  Bowel sounds positive,abdomen soft and non-tender without masses, organomegaly or hernias noted.   Impression & Recommendations:  Problem # 1:  HYPERTENSION (ICD-401.9) Assessment Unchanged  His updated medication list for this problem includes:    Benicar 20 Mg Tabs (Olmesartan medoxomil) .Marland Kitchen... 1/2 by mouth  daily  BP today: 124/72 Prior BP: 130/80 (06/08/2009)  Prior 10 Yr Risk Heart Disease: N/A (12/17/2007)  Labs Reviewed: K+: 4.3 (01/15/2008) Creat: : 1.0 (01/15/2008)   Chol: 139 (01/15/2009)   HDL: 33.10 (01/15/2009)   LDL: 93 (05/06/2008)   TG: 108 (05/06/2008)  Problem # 2:  HYPERLIPIDEMIA (ICD-272.4) Assessment: Unchanged  His updated medication list for this problem includes:    Lipitor 10 Mg Tabs (Atorvastatin calcium) ..... One  by mouth daily  Labs Reviewed: SGOT: 21 (05/06/2008)   SGPT: 23 (05/06/2008)  Lipid Goals: Chol Goal: 200 (03/12/2008)   HDL Goal: 40 (03/12/2008)   LDL Goal: 100 (03/12/2008)   TG Goal: 150 (03/12/2008)  Prior 10 Yr Risk Heart Disease: N/A (12/17/2007)   HDL:33.10 (01/15/2009), 24.2 (05/06/2008)  LDL:93 (05/06/2008), 76 (03/05/2008)  Chol:139 (01/15/2009), 139 (05/06/2008)  Trig:108 (05/06/2008), 146 (03/05/2008)  Problem # 3:  GERD (ICD-530.81) Assessment: Unchanged  His updated medication list for this problem includes:    Zantac 150 Mg Tabs (Ranitidine hcl) .Marland Kitchen... 1 once daily  Labs Reviewed: Hgb: 15.3 (01/15/2009)   Hct: 43.9 (01/15/2009)  Complete Medication List: 1)  Lipitor 10 Mg Tabs (Atorvastatin calcium) .... One  by mouth daily 2)  Multivitamins Tabs (Multiple vitamin) .... Once daily 3)  Bayer Aspirin 325 Mg Tabs (Aspirin) .... Take 1 by mouth once daily 4)  Claritin 10 Mg Tabs (Loratadine) .... As needed 5)  Ultravate 0.05 % Crea (Halobetasol propionate) .... Apply as directed 6)  Plavix 75 Mg Tabs (Clopidogrel bisulfate) .... Once daily 7)  Zantac 150 Mg Tabs (Ranitidine hcl) .Marland Kitchen.. 1 once daily 8)  Benicar 20 Mg Tabs (Olmesartan medoxomil) .... 1/2 by mouth daily 9)  Fish Oil 1200  .... Two by mouth bid 10)  Imitrex 25 Mg Tabs (Sumatriptan succinate) .... As needed 11)  Avodart 0.5 Mg Caps (Dutasteride) .Marland Kitchen.. 1 once daily alternating w finasteride  Hypertension Assessment/Plan:      The patient's hypertensive risk group is  category C: Target organ damage and/or diabetes.  Today's blood pressure is 124/72.  His blood pressure goal is < 140/90.  Lipid Assessment/Plan:      Based on NCEP/ATP III, the patient's risk factor category is "history of coronary disease, peripheral vascular disease, cerebrovascular disease, or aortic aneurysm".  The patient's lipid goals are as follows: Total cholesterol goal is 200; LDL cholesterol goal is 100; HDL cholesterol goal is 40; Triglyceride goal is 150.    Patient Instructions: 1)  [place] exam in June  2)  pt to be FASTING

## 2010-10-11 NOTE — Letter (Signed)
Summary: Letter from spouse regarding need for RX  Letter from spouse regarding need for RX   Imported By: Maryln Gottron 09/30/2009 10:43:30  _____________________________________________________________________  External Attachment:    Type:   Image     Comment:   External Document

## 2010-10-11 NOTE — Letter (Signed)
Summary: [facility]  [facility]   Imported By: Lanelle Bal 05/19/2010 12:08:01  _____________________________________________________________________  External Attachment:    Type:   Image     Comment:   External Document

## 2010-10-11 NOTE — Assessment & Plan Note (Signed)
Summary: 5 mo rov/mm/pt rsc from bmp/cjr   Vital Signs:  Patient profile:   77 year old male Height:      73 inches Weight:      212 pounds BMI:     28.07 Temp:     98.2 degrees F oral Pulse rate:   60 / minute Resp:     14 per minute BP sitting:   124 / 80  (left arm)  Vitals Entered By: Willy Eddy, LPN (March 01, 2010 11:40 AM)  Nutrition Counseling: Patient's BMI is greater than 25 and therefore counseled on weight management options. CC: roa- c/o bilateral thumb pain, Hypertension Management, Lipid Management   CC:  roa- c/o bilateral thumb pain, Hypertension Management, and Lipid Management.  History of Present Illness: follow up HTN and hyperlipdemia  Hypertension History:      He denies headache, chest pain, palpitations, dyspnea with exertion, orthopnea, PND, peripheral edema, visual symptoms, neurologic problems, syncope, and side effects from treatment.        Positive major cardiovascular risk factors include male age 72 years old or older, hyperlipidemia, and hypertension.  Negative major cardiovascular risk factors include negative family history for ischemic heart disease and non-tobacco-user status.        Positive history for target organ damage include ASHD (either angina/prior MI/prior CABG).    Lipid Management History:      Positive NCEP/ATP III risk factors include male age 34 years old or older, HDL cholesterol less than 40, hypertension, and ASHD (either angina/prior MI/prior CABG).  Negative NCEP/ATP III risk factors include no family history for ischemic heart disease and non-tobacco-user status.      Preventive Screening-Counseling & Management  Alcohol-Tobacco     Smoking Status: quit     Year Quit: 1963     Passive Smoke Exposure: no  Problems Prior to Update: 1)  Bronchitis, Acute With Mild Bronchospasm  (ICD-466.0) 2)  Benign Prostatic Hypertrophy, With Obstruction  (ICD-600.01) 3)  Herpes Zoster  (ICD-053.9) 4)  Osteoarthritis, Hands,  Bilateral  (ICD-715.94) 5)  Rbbb  (ICD-426.4) 6)  Cad  (ICD-414.00) 7)  Hypertension  (ICD-401.9) 8)  Hyperlipidemia  (ICD-272.4) 9)  Peptic Ulcer Disease, Hx of  (ICD-V12.71) 10)  Diverticulosis, Mild  (ICD-562.10) 11)  Gerd  (ICD-530.81) 12)  Dizziness  (ICD-780.4) 13)  Conductive Hearing Loss External Ear  (ICD-389.01) 14)  Degenerative Joint Disease, Hands  (ICD-715.94) 15)  Loss, Conductive Hearing, Combined Type  (ICD-389.08) 16)  Family History of Cad Male 1st Degree Relative <50  (ICD-V17.3) 17)  Hx, Personal, Urinary Calculi  (ICD-V13.01) 18)  Headache  (ICD-784.0) 19)  Osteoarthritis  (ICD-715.90) 20)  Allergic Rhinitis  (ICD-477.9)  Current Problems (verified): 1)  Bronchitis, Acute With Mild Bronchospasm  (ICD-466.0) 2)  Benign Prostatic Hypertrophy, With Obstruction  (ICD-600.01) 3)  Herpes Zoster  (ICD-053.9) 4)  Osteoarthritis, Hands, Bilateral  (ICD-715.94) 5)  Rbbb  (ICD-426.4) 6)  Cad  (ICD-414.00) 7)  Hypertension  (ICD-401.9) 8)  Hyperlipidemia  (ICD-272.4) 9)  Peptic Ulcer Disease, Hx of  (ICD-V12.71) 10)  Diverticulosis, Mild  (ICD-562.10) 11)  Gerd  (ICD-530.81) 12)  Dizziness  (ICD-780.4) 13)  Conductive Hearing Loss External Ear  (ICD-389.01) 14)  Degenerative Joint Disease, Hands  (ICD-715.94) 15)  Loss, Conductive Hearing, Combined Type  (ICD-389.08) 16)  Family History of Cad Male 1st Degree Relative <50  (ICD-V17.3) 17)  Hx, Personal, Urinary Calculi  (ICD-V13.01) 18)  Headache  (ICD-784.0) 19)  Osteoarthritis  (ICD-715.90) 20)  Allergic  Rhinitis  (ICD-477.9)  Medications Prior to Update: 1)  Lipitor 10 Mg  Tabs (Atorvastatin Calcium) .... One  By Mouth Daily 2)  Multivitamins   Tabs (Multiple Vitamin) .... Once Daily 3)  Bayer Aspirin 325 Mg Tabs (Aspirin) .... Take 1 By Mouth Once Daily 4)  Claritin 10 Mg  Tabs (Loratadine) .... As Needed 5)  Ultravate 0.05 %  Crea (Halobetasol Propionate) .... Apply As Directed 6)  Plavix 75 Mg  Tabs  (Clopidogrel Bisulfate) .... Once Daily 7)  Zantac 150 Mg Tabs (Ranitidine Hcl) .Marland Kitchen.. 1 Once Daily 8)  Benicar 20 Mg  Tabs (Olmesartan Medoxomil) .... 1/2 By Mouth Daily.Marland Kitchen...which Is 10mg  Daily 9)  Fish Oil 1200 .... Two By Mouth Bid 10)  Imitrex 25 Mg Tabs (Sumatriptan Succinate) .... As Needed 11)  Avodart 0.5 Mg Caps (Dutasteride) .Marland Kitchen.. 1 Once Daily Alternating W Finasteride 12)  Celexa 10 Mg Tabs (Citalopram Hydrobromide) .Marland Kitchen.. 1 Once Daily  Current Medications (verified): 1)  Lipitor 10 Mg  Tabs (Atorvastatin Calcium) .... One  By Mouth Daily 2)  Multivitamins   Tabs (Multiple Vitamin) .... Once Daily 3)  Bayer Aspirin 325 Mg Tabs (Aspirin) .... Take 1 By Mouth Once Daily 4)  Claritin 10 Mg  Tabs (Loratadine) .... As Needed 5)  Ultravate 0.05 %  Crea (Halobetasol Propionate) .... Apply As Directed 6)  Plavix 75 Mg  Tabs (Clopidogrel Bisulfate) .... Once Daily 7)  Zantac 150 Mg Tabs (Ranitidine Hcl) .Marland Kitchen.. 1 Once Daily 8)  Benicar 20 Mg  Tabs (Olmesartan Medoxomil) .... 1/2 By Mouth Daily.Marland Kitchen...which Is 10mg  Daily 9)  Fish Oil 1200 .... Two By Mouth Bid 10)  Imitrex 25 Mg Tabs (Sumatriptan Succinate) .... As Needed 11)  Avodart 0.5 Mg Caps (Dutasteride) .Marland Kitchen.. 1 Once Daily Alternating W Finasteride 12)  Celexa 10 Mg Tabs (Citalopram Hydrobromide) .Marland Kitchen.. 1 Once Daily  Allergies (verified): 1)  ! Sulfa 2)  ! Dilaudid (Hydromorphone Hcl)  Past History:  Family History: Last updated: 01-13-09 father dies of CAD at age 38 Family History of CAD Male 1st degree relative <50 mother died at 65 of colon obstruction  Social History: Last updated: 12/28/2008 Married Former Smoker quit at age 77 Retired [place]  Risk Factors: Smoking Status: quit (03/01/2010) Passive Smoke Exposure: no (03/01/2010)  Past medical, surgical, family and social histories (including risk factors) reviewed, and no changes noted (except as noted below).  Past Medical History: Reviewed history  from 2009/01/13 and no changes required. Current Problems:  CAD (ICD-414.00) HYPERTENSION (ICD-401.9) HYPERLIPIDEMIA (ICD-272.4) PEPTIC ULCER DISEASE, HX OF (ICD-V12.71) DIVERTICULOSIS, MILD (ICD-562.10) GERD (ICD-530.81) DIZZINESS (ICD-780.4) CONDUCTIVE HEARING LOSS EXTERNAL EAR (ICD-389.01) DEGENERATIVE JOINT DISEASE, HANDS (ICD-715.94) LOSS, CONDUCTIVE HEARING, COMBINED TYPE (ICD-389.08) FAMILY HISTORY OF CAD MALE 1ST DEGREE RELATIVE <50 (ICD-V17.3) HX, PERSONAL, URINARY CALCULI (ICD-V13.01) HEADACHE (ICD-784.0) OSTEOARTHRITIS (ICD-715.90) ALLERGIC RHINITIS (ICD-477.9)  Past Surgical History: Reviewed history from 04/16/2007 and no changes required. Total knee replacement Cataract extraction arthoscopic surgery right knee  Family History: Reviewed history from Jan 13, 2009 and no changes required. father dies of CAD at age 5 Family History of CAD Male 1st degree relative <50 mother died at 41 of colon obstruction  Social History: Reviewed history from 12/28/2008 and no changes required. Married Former Smoker quit at age 58 Retired Spends time in [employer]  Physical Exam  General:  Affect appropriate Healthy:  appears stated age HEENT: normal Neck supple with no adenopathy JVP normal no bruits no thyromegaly Lungs clear with no wheezing and good diaphragmatic  motion Heart:  S1/S2 no murmur,rub, gallop or click PMI normal Abdomen: benighn, BS positve, no tenderness, no AAA no bruit.  No HSM or HJR Distal pulses intact with no bruits No edema Neuro non-focal Skin warm and dry  Head:  normocephalic and male-pattern balding.   Eyes:  pupils equal and pupils round.   Ears:  R ear normal and L ear normal.   Nose:  no external deformity.  nasal dischargemucosal pallor.   Mouth:  posterior lymphoid hypertrophy and postnasal drip.   Neck:  No deformities, masses, or tenderness noted. Lungs:  normal respiratory effort and no wheezes.   Heart:  normal rate and  regular rhythm.   Abdomen:  soft and normal bowel sounds.   Skin:  turgor normal and color normal.   Psych:  Oriented X3 and good eye contact.     Impression & Recommendations:  Problem # 1:  HYPERTENSION (ICD-401.9)  His updated medication list for this problem includes:    Benicar 20 Mg Tabs (Olmesartan medoxomil) .Marland Kitchen... 1/2 by mouth daily.Marland Kitchen...which is 10mg  daily  BP today: 124/80 Prior BP: 120/80 (01/11/2010)  Prior 10 Yr Risk Heart Disease: N/A (12/17/2007)  Labs Reviewed: K+: 4.3 (01/15/2008) Creat: : 1.0 (01/15/2008)   Chol: 139 (01/15/2009)   HDL: 33.10 (01/15/2009)   LDL: 93 (05/06/2008)   TG: 108 (05/06/2008)  Orders: TLB-BMP (Basic Metabolic Panel-BMET) (80048-METABOL)  Problem # 2:  HYPERLIPIDEMIA (ICD-272.4)  His updated medication list for this problem includes:    Lipitor 10 Mg Tabs (Atorvastatin calcium) ..... One  by mouth daily  Labs Reviewed: SGOT: 21 (05/06/2008)   SGPT: 23 (05/06/2008)  Lipid Goals: Chol Goal: 200 (03/12/2008)   HDL Goal: 40 (03/12/2008)   LDL Goal: 100 (03/12/2008)   TG Goal: 150 (03/12/2008)  Prior 10 Yr Risk Heart Disease: N/A (12/17/2007)   HDL:33.10 (01/15/2009), 24.2 (05/06/2008)  LDL:93 (05/06/2008), 76 (03/05/2008)  Chol:139 (01/15/2009), 139 (05/06/2008)  Trig:108 (05/06/2008), 146 (03/05/2008)  Orders: Venipuncture (28413) TLB-Cholesterol, HDL (83718-HDL) TLB-Cholesterol, Direct LDL (83721-DIRLDL) TLB-Cholesterol, Total (82465-CHO) TLB-TSH (Thyroid Stimulating Hormone) (84443-TSH)  Problem # 3:  OSTEOARTHRITIS, HANDS, BILATERAL (ICD-715.94)  Informed consen obtained and then  both hand thumb  jointswere prepped in a sterile manor and 20 mg depo and 1/2 cc 1% lidocaine injected into the synovial space. After care discussed. Pt tolerated procedure well.  His updated medication list for this problem includes:    Bayer Aspirin 325 Mg Tabs (Aspirin) .Marland Kitchen... Take 1 by mouth once daily  Discussed use of medications, application  of heat or cold, and exercises.   Orders: Joint Aspirate / Injection, Small (24401) Depo-Medrol 20mg  (J1020)  Complete Medication List: 1)  Lipitor 10 Mg Tabs (Atorvastatin calcium) .... One  by mouth daily 2)  Multivitamins Tabs (Multiple vitamin) .... Once daily 3)  Bayer Aspirin 325 Mg Tabs (Aspirin) .... Take 1 by mouth once daily 4)  Claritin 10 Mg Tabs (Loratadine) .... As needed 5)  Ultravate 0.05 % Crea (Halobetasol propionate) .... Apply as directed 6)  Plavix 75 Mg Tabs (Clopidogrel bisulfate) .... Once daily 7)  Zantac 150 Mg Tabs (Ranitidine hcl) .Marland Kitchen.. 1 once daily 8)  Benicar 20 Mg Tabs (Olmesartan medoxomil) .... 1/2 by mouth daily.Marland Kitchen...which is 10mg  daily 9)  Fish Oil 1200  .... Two by mouth bid 10)  Imitrex 25 Mg Tabs (Sumatriptan succinate) .... As needed 11)  Avodart 0.5 Mg Caps (Dutasteride) .Marland Kitchen.. 1 once daily alternating w finasteride 12)  Celexa 10 Mg Tabs (Citalopram hydrobromide) .Marland KitchenMarland KitchenMarland Kitchen 1  once daily  Hypertension Assessment/Plan:      The patient's hypertensive risk group is category C: Target organ damage and/or diabetes.  Today's blood pressure is 124/80.  His blood pressure goal is < 140/90.  Lipid Assessment/Plan:      Based on NCEP/ATP III, the patient's risk factor category is "history of coronary disease, peripheral vascular disease, cerebrovascular disease, or aortic aneurysm".  The patient's lipid goals are as follows: Total cholesterol goal is 200; LDL cholesterol goal is 100; HDL cholesterol goal is 40; Triglyceride goal is 150.  His LDL cholesterol goal has been met.    Patient Instructions: 1)  Please schedule a follow-up appointment in 4 months.

## 2010-10-11 NOTE — Letter (Signed)
Summary: E-mail Request for refills from family member  E-mail Request for refills from family member   Imported By: Maryln Gottron 06/22/2010 14:18:50  _____________________________________________________________________  External Attachment:    Type:   Image     Comment:   External Document

## 2010-10-11 NOTE — Progress Notes (Signed)
Summary: help with questions on a insurance application regarding health  Phone Note Call from Patient Call back at [phone]   Caller: Spouse/[name] Summary of Call: pt request call about question that need to be answered on a insurance application Initial call taken by: Judie Grieve,  June 30, 2010 4:19 PM  Follow-up for Phone Call        Phone Call Completed Deliah Goody, RN  June 30, 2010 6:49 PM

## 2010-10-11 NOTE — Letter (Signed)
Summary: [facility]   [facility] Clearance   Imported By: Roderic Ovens 02/23/2010 11:23:41  _____________________________________________________________________  External Attachment:    Type:   Image     Comment:   External Document

## 2010-10-11 NOTE — Progress Notes (Signed)
Summary: QUESTION ABOUT MEDICATION DUE TO EYE SURGERY  Phone Note Call from Patient Call back at [phone]   Caller: Spouse/ Sean Lane Summary of Call: PT HAVING SURGERY ON EYES AND NEED TO TALK TO SOMEONE ABOUT HIS ASPIRIN IN TAKE AND PLAVIX Initial call taken by: Judie Grieve,  Jan 20, 2010 9:50 AM  Follow-up for Phone Call        SPOKE WITH WIFE IS HAVING CATARACT REMOVED  ON R EYE   ON MAY 23 MD WANTS PT TO HOLD ASA FOR 10 DAYS PRIOR AND PLAVIX 5 DAYS PRIOR IS THIS OKAY ? STENTS DONE 11/2007 NO PROBLEMS PER WIFE . Follow-up by: Scherrie Bateman, LPN,  Jan 20, 2010 10:03 AM  Additional Follow-up for Phone Call Additional follow up Details #1::        OKAY PER DR Eden Emms PRE OP CLEARANCE NOTE FAXED TO [facility]. WIFE AWARE.  Additional Follow-up by: Scherrie Bateman, LPN,  Jan 20, 2010 10:18 AM

## 2010-10-11 NOTE — Assessment & Plan Note (Signed)
Summary: uri/bmw   Vital Signs:  Patient profile:   77 year old male Height:      73 inches Weight:      212 pounds BMI:     28.07 Temp:     97.9 degrees F oral Pulse rate:   72 / minute Pulse rhythm:   irregular Resp:     14 per minute BP sitting:   136 / 66  (left arm)  Vitals Entered By: Willy Eddy, LPN (November 22, 2009 3:34 PM) CC: c/o myalasgia, couigh , chest congestion and burning in chest and afebrile, URI symptoms   CC:  c/o myalasgia, couigh , chest congestion and burning in chest and afebrile, and URI symptoms.  History of Present Illness:  URI Symptoms      This is a 77 year old man who presents with URI symptoms.  The patient reports nasal congestion, purulent nasal discharge, and productive cough.  The patient denies fever.  The patient also reports headache, muscle aches, and severe fatigue.  The patient denies itchy watery eyes and itchy throat.  The patient denies the following risk factors for Strep sinusitis: unilateral facial pain, tooth pain, Strep exposure, and tender adenopathy.    Preventive Screening-Counseling & Management  Alcohol-Tobacco     Smoking Status: quit     Year Quit: 1963     Passive Smoke Exposure: no  Problems Prior to Update: 1)  Bronchitis, Acute With Mild Bronchospasm  (ICD-466.0) 2)  Benign Prostatic Hypertrophy, With Obstruction  (ICD-600.01) 3)  Herpes Zoster  (ICD-053.9) 4)  Osteoarthritis, Hands, Bilateral  (ICD-715.94) 5)  Rbbb  (ICD-426.4) 6)  Cad  (ICD-414.00) 7)  Hypertension  (ICD-401.9) 8)  Hyperlipidemia  (ICD-272.4) 9)  Peptic Ulcer Disease, Hx of  (ICD-V12.71) 10)  Diverticulosis, Mild  (ICD-562.10) 11)  Gerd  (ICD-530.81) 12)  Dizziness  (ICD-780.4) 13)  Conductive Hearing Loss External Ear  (ICD-389.01) 14)  Degenerative Joint Disease, Hands  (ICD-715.94) 15)  Loss, Conductive Hearing, Combined Type  (ICD-389.08) 16)  Family History of Cad Male 1st Degree Relative <50  (ICD-V17.3) 17)  Hx, Personal,  Urinary Calculi  (ICD-V13.01) 18)  Headache  (ICD-784.0) 19)  Osteoarthritis  (ICD-715.90) 20)  Allergic Rhinitis  (ICD-477.9)  Current Problems (verified): 1)  Benign Prostatic Hypertrophy, With Obstruction  (ICD-600.01) 2)  Herpes Zoster  (ICD-053.9) 3)  Osteoarthritis, Hands, Bilateral  (ICD-715.94) 4)  Rbbb  (ICD-426.4) 5)  Cad  (ICD-414.00) 6)  Hypertension  (ICD-401.9) 7)  Hyperlipidemia  (ICD-272.4) 8)  Peptic Ulcer Disease, Hx of  (ICD-V12.71) 9)  Diverticulosis, Mild  (ICD-562.10) 10)  Gerd  (ICD-530.81) 11)  Dizziness  (ICD-780.4) 12)  Conductive Hearing Loss External Ear  (ICD-389.01) 13)  Degenerative Joint Disease, Hands  (ICD-715.94) 14)  Loss, Conductive Hearing, Combined Type  (ICD-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 One  By Mouth Daily 2)  Multivitamins   Tabs (Multiple Vitamin) .... Once Daily 3)  Bayer Aspirin 325 Mg Tabs (Aspirin) .... Take 1 By Mouth Once Daily 4)  Claritin 10 Mg  Tabs (Loratadine) .... As Needed 5)  Ultravate 0.05 %  Crea (Halobetasol Propionate) .... Apply As Directed 6)  Plavix 75 Mg  Tabs (Clopidogrel Bisulfate) .... Once Daily 7)  Zantac 150 Mg Tabs (Ranitidine Hcl) .Marland Kitchen.. 1 Once Daily 8)  Benicar 20 Mg  Tabs (Olmesartan Medoxomil) .... 1/2 By Mouth Daily 9)  Fish Oil 1200 .... Two By Mouth Bid 10)  Imitrex 25 Mg Tabs (Sumatriptan Succinate) .... As Needed 11)  Avodart 0.5 Mg Caps (Dutasteride) .Marland Kitchen.. 1 Once Daily Alternating W Finasteride 12)  Celexa 10 Mg Tabs (Citalopram Hydrobromide) .Marland Kitchen.. 1 Once Daily  Current Medications (verified): 1)  Lipitor 10 Mg  Tabs (Atorvastatin Calcium) .... One  By Mouth Daily 2)  Multivitamins   Tabs (Multiple Vitamin) .... Once Daily 3)   Bayer Aspirin 325 Mg Tabs (Aspirin) .... Take 1 By Mouth Once Daily 4)  Claritin 10 Mg  Tabs (Loratadine) .... As Needed 5)  Ultravate 0.05 %  Crea (Halobetasol Propionate) .... Apply As Directed 6)  Plavix 75 Mg  Tabs (Clopidogrel Bisulfate) .... Once Daily 7)  Zantac 150 Mg Tabs (Ranitidine Hcl) .Marland Kitchen.. 1 Once Daily 8)  Benicar 20 Mg  Tabs (Olmesartan Medoxomil) .... 1/2 By Mouth Daily 9)  Fish Oil 1200 .... Two By Mouth Bid 10)  Imitrex 25 Mg Tabs (Sumatriptan Succinate) .... As Needed 11)  Avodart 0.5 Mg Caps (Dutasteride) .Marland Kitchen.. 1 Once Daily Alternating W Finasteride 12)  Celexa 10 Mg Tabs (Citalopram Hydrobromide) .Marland Kitchen.. 1 Once Daily  Allergies (verified): 1)  ! Sulfa 2)  ! Dilaudid (Hydromorphone Hcl)  Past History:  Family History: Last updated: December 31, 2008 father dies of CAD at age 63 Family History of CAD Male 1st degree relative <50 mother died at 34 of colon obstruction  Social History: Last updated: 12/28/2008 Married Former Smoker quit at age 21 Retired [place]  Risk Factors: Smoking Status: quit (11/22/2009) Passive Smoke Exposure: no (11/22/2009)  Past medical, surgical, family and social histories (including risk factors) reviewed, and no changes noted (except as noted below).  Past Medical History: Reviewed history from 31-Dec-2008 and no changes required. Current Problems:  CAD (ICD-414.00) HYPERTENSION (ICD-401.9) HYPERLIPIDEMIA (ICD-272.4) PEPTIC ULCER DISEASE, HX OF (ICD-V12.71) DIVERTICULOSIS, MILD (ICD-562.10) GERD (ICD-530.81) DIZZINESS (ICD-780.4) CONDUCTIVE HEARING LOSS EXTERNAL EAR (ICD-389.01) DEGENERATIVE JOINT DISEASE, HANDS (ICD-715.94) LOSS, CONDUCTIVE HEARING, COMBINED TYPE (ICD-389.08) FAMILY HISTORY OF CAD MALE 1ST DEGREE RELATIVE <50 (ICD-V17.3) HX, PERSONAL, URINARY CALCULI (ICD-V13.01) HEADACHE (ICD-784.0) OSTEOARTHRITIS (ICD-715.90) ALLERGIC RHINITIS (ICD-477.9)  Past Surgical History: Reviewed history from  04/16/2007 and no changes required. Total knee replacement Cataract extraction arthoscopic surgery right knee  Family History: Reviewed history from 12-31-08 and no changes required. father dies of CAD at age 75 Family History of CAD Male 1st degree relative <50 mother died at 90 of colon obstruction  Social History: Reviewed history from 12/28/2008 and no changes required. Married Former Smoker quit at age 51 Retired [place]  Review of Systems  The patient denies anorexia, fever, weight loss, weight gain, vision loss, decreased hearing, hoarseness, chest pain, syncope, dyspnea on exertion, peripheral edema, prolonged cough, headaches, hemoptysis, abdominal pain, melena, hematochezia, severe indigestion/heartburn, hematuria, incontinence, genital sores, muscle weakness, suspicious skin lesions, transient blindness, difficulty walking, depression, unusual weight change, abnormal bleeding, enlarged lymph nodes, angioedema, and breast masses.    Physical Exam  General:  Affect appropriate Healthy:  appears stated age HEENT: normal Neck supple with no adenopathy JVP normal no bruits no thyromegaly Lungs clear with no wheezing and good diaphragmatic motion Heart:  S1/S2 no murmur,rub, gallop or click PMI normal Abdomen: benighn, BS positve, no tenderness, no AAA no bruit.  No HSM or HJR Distal pulses  intact with no bruits No edema Neuro non-focal Skin warm and dry  Head:  normocephalic and male-pattern balding.   Eyes:  pupils equal and pupils round.   Ears:  R ear normal and L ear normal.   Nose:  no external deformity.  nasal dischargemucosal pallor.   Mouth:  posterior lymphoid hypertrophy and postnasal drip.   Neck:  No deformities, masses, or tenderness noted. Lungs:  normal respiratory effort and R wheezes.   Heart:  normal rate and regular rhythm.   Abdomen:  Bowel sounds positive,abdomen soft and non-tender without masses, organomegaly or hernias  noted. Rectal:  No external abnormalities noted. Normal sphincter tone. No rectal masses or tenderness.   Impression & Recommendations:  Problem # 1:  BRONCHITIS, ACUTE WITH MILD BRONCHOSPASM (ICD-466.0) Assessment New acute bronchitis with cough induced asthma  cough meds, antibiotic and montiering wife present and instructed on monitering for signs of worsening respiratory dz Take antibiotics and other medications as directed. Encouraged to push clear liquids, get enough rest, and take acetaminophen as needed. To be seen in 5-7 days if no improvement, sooner if worse.  Complete Medication List: 1)  Lipitor 10 Mg Tabs (Atorvastatin calcium) .... One  by mouth daily 2)  Multivitamins Tabs (Multiple vitamin) .... Once daily 3)  Bayer Aspirin 325 Mg Tabs (Aspirin) .... Take 1 by mouth once daily 4)  Claritin 10 Mg Tabs (Loratadine) .... As needed 5)  Ultravate 0.05 % Crea (Halobetasol propionate) .... Apply as directed 6)  Plavix 75 Mg Tabs (Clopidogrel bisulfate) .... Once daily 7)  Zantac 150 Mg Tabs (Ranitidine hcl) .Marland Kitchen.. 1 once daily 8)  Benicar 20 Mg Tabs (Olmesartan medoxomil) .... 1/2 by mouth daily 9)  Fish Oil 1200  .... Two by mouth bid 10)  Imitrex 25 Mg Tabs (Sumatriptan succinate) .... As needed 11)  Avodart 0.5 Mg Caps (Dutasteride) .Marland Kitchen.. 1 once daily alternating w finasteride 12)  Celexa 10 Mg Tabs (Citalopram hydrobromide) .Marland Kitchen.. 1 once daily  Patient Instructions: 1)  take the antibiotics for seven days 2)  and get mucinex dm and dimatapp twiuce a day Prescriptions: CLARITHROMYCIN 500 MG XR24H-TAB (CLARITHROMYCIN) one by mouth two times a day for 7 days  #14 x 0   Entered and Authorized by:   Stacie Glaze MD   Signed by:   Stacie Glaze MD on 11/22/2009   Method used:   Electronically to        [facility]  [phone]* (retail)       [address]       [place], [place]  [address]       Ph: [phone] or [phone]       Fax:  [phone]   RxID:   [id number]

## 2010-10-11 NOTE — Progress Notes (Signed)
Summary: monitor  Phone Note Outgoing Call   Call placed by: Marcos Eke,  July 27, 2010 12:29 PM Action Taken: Appt scheduled Summary of Call: Pt has appt for 07/29/10 at 11 am for monitor Initial call taken by: Marcos Eke,  July 27, 2010 12:29 PM

## 2010-10-11 NOTE — Op Note (Signed)
Summary: Cataract Removal Left Eye/Dr. Ernesto Rutherford  Cataract Removal Left Eye/Dr. Ernesto Rutherford   Imported By: Maryln Gottron 02/24/2010 10:11:16  _____________________________________________________________________  External Attachment:    Type:   Image     Comment:   External Document

## 2010-10-11 NOTE — Assessment & Plan Note (Signed)
Summary: 4 mo rov/mm/pt rsc from bmp/cjr---PTS WIFE [facility] // RS   Vital Signs:  Patient profile:   77 year old male Height:      73 inches Weight:      214 pounds BMI:     28.34 Temp:     98.2 degrees F oral Pulse rate:   60 / minute Resp:     14 per minute BP sitting:   120 / 70  (left arm)  Vitals Entered By: Willy Eddy, LPN (July 20, 2010 2:55 PM) CC: roa- c/o fatigue, concerned about anemia, Hypertension Management Is Patient Diabetic? No   Primary Care Provider:  Stacie Glaze MD  CC:  roa- c/o fatigue, concerned about anemia, and Hypertension Management.  History of Present Illness: the pt has spell of weakness and when examining the pt has multiple PVC's... by the time  we got the EKG he was in SR ( bradicardia with chronic RBBB) I suspect he has either symptomatic bradycardia or symptomatic pauses We have never noted this before.. but I worry about the need for a pacer will proceed with an even moniter and foolow up with his cardiologist  Hypertension History:      He denies headache, chest pain, palpitations, dyspnea with exertion, orthopnea, PND, peripheral edema, visual symptoms, neurologic problems, syncope, and side effects from treatment.        Positive major cardiovascular risk factors include male age 50 years old or older, hyperlipidemia, and hypertension.  Negative major cardiovascular risk factors include negative family history for ischemic heart disease and non-tobacco-user status.        Positive history for target organ damage include ASHD (either angina/prior MI/prior CABG).     Preventive Screening-Counseling & Management  Alcohol-Tobacco     Smoking Status: quit     Year Quit: 1963     Passive Smoke Exposure: no  Problems Prior to Update: 1)  Sinus Bradycardia  (ICD-427.81) 2)  Bronchitis, Acute With Mild Bronchospasm  (ICD-466.0) 3)  Benign Prostatic Hypertrophy, With Obstruction  (ICD-600.01) 4)  Herpes Zoster  (ICD-053.9) 5)   Osteoarthritis, Hands, Bilateral  (ICD-715.94) 6)  Rbbb  (ICD-426.4) 7)  Cad  (ICD-414.00) 8)  Hypertension  (ICD-401.9) 9)  Hyperlipidemia  (ICD-272.4) 10)  Peptic Ulcer Disease, Hx of  (ICD-V12.71) 11)  Diverticulosis, Mild  (ICD-562.10) 12)  Gerd  (ICD-530.81) 13)  Dizziness  (ICD-780.4) 14)  Conductive Hearing Loss External Ear  (ICD-389.01) 15)  Degenerative Joint Disease, Hands  (ICD-715.94) 16)  Loss, Conductive Hearing, Combined Type  (ICD-389.08) 17)  Family History of Cad Male 1st Degree Relative <50  (ICD-V17.3) 18)  Hx, Personal, Urinary Calculi  (ICD-V13.01) 19)  Headache  (ICD-784.0) 20)  Osteoarthritis  (ICD-715.90) 21)  Allergic Rhinitis  (ICD-477.9)  Current Problems (verified): 1)  Bronchitis, Acute With Mild Bronchospasm  (ICD-466.0) 2)  Benign Prostatic Hypertrophy, With Obstruction  (ICD-600.01) 3)  Herpes Zoster  (ICD-053.9) 4)  Osteoarthritis, Hands, Bilateral  (ICD-715.94) 5)  Rbbb  (ICD-426.4) 6)  Cad  (ICD-414.00) 7)  Hypertension  (ICD-401.9) 8)  Hyperlipidemia  (ICD-272.4) 9)  Peptic Ulcer Disease, Hx of  (ICD-V12.71) 10)  Diverticulosis, Mild  (ICD-562.10) 11)  Gerd  (ICD-530.81) 12)  Dizziness  (ICD-780.4) 13)  Conductive Hearing Loss External Ear  (ICD-389.01) 14)  Degenerative Joint Disease, Hands  (ICD-715.94) 15)  Loss, Conductive Hearing, Combined Type  (ICD-389.08) 16)  Family History of Cad Male 1st Degree Relative <50  (ICD-V17.3) 17)  Hx, Personal, Urinary Calculi  (  ICD-V13.01) 18)  Headache  (ICD-784.0) 19)  Osteoarthritis  (ICD-715.90) 20)  Allergic Rhinitis  (ICD-477.9)  Medications Prior to Update: 1)  Lipitor 10 Mg  Tabs (Atorvastatin Calcium) .... One  By Mouth Daily 2)  Multivitamins   Tabs (Multiple Vitamin) .... Once Daily 3)  Bayer Aspirin 325 Mg Tabs (Aspirin) .... Take 1 By Mouth Once Daily 4)  Claritin 10 Mg  Tabs (Loratadine) .... As Needed 5)  Ultravate 0.05 %  Crea (Halobetasol Propionate) .... Apply As Directed 6)   Plavix 75 Mg  Tabs (Clopidogrel Bisulfate) .... Once Daily 7)  Zantac 150 Mg Tabs (Ranitidine Hcl) .Marland Kitchen.. 1 Once Daily 8)  Benicar 20 Mg  Tabs (Olmesartan Medoxomil) .... 1/2 By Mouth Daily.Marland Kitchen...which Is 10mg  Daily 9)  Fish Oil 1200 .... Two By Mouth Bid 10)  Imitrex 25 Mg Tabs (Sumatriptan Succinate) .... As Needed 11)  Avodart 0.5 Mg Caps (Dutasteride) .Marland Kitchen.. 1 Once Daily Alternating W Finasteride 12)  Celexa 10 Mg Tabs (Citalopram Hydrobromide) .Marland Kitchen.. 1 Once Daily 13)  Medrol (Pak) 4 Mg Tabs (Methylprednisolone) .... 6 Day Dose Pack-Take As Directed  Current Medications (verified): 1)  Lipitor 10 Mg  Tabs (Atorvastatin Calcium) .... One  By Mouth Daily 2)  Multivitamins   Tabs (Multiple Vitamin) .... Once Daily 3)  Bayer Aspirin 325 Mg Tabs (Aspirin) .... Take 1 By Mouth Once Daily 4)  Claritin 10 Mg  Tabs (Loratadine) .... As Needed 5)  Ultravate 0.05 %  Crea (Halobetasol Propionate) .... Apply As Directed 6)  Plavix 75 Mg  Tabs (Clopidogrel Bisulfate) .... Once Daily 7)  Zantac 150 Mg Tabs (Ranitidine Hcl) .Marland Kitchen.. 1 Once Daily 8)  Benicar 20 Mg  Tabs (Olmesartan Medoxomil) .... 1/2 By Mouth Daily.Marland Kitchen...which Is 10mg  Daily 9)  Fish Oil 1200 .... Two By Mouth Bid 10)  Imitrex 25 Mg Tabs (Sumatriptan Succinate) .... As Needed 11)  Avodart 0.5 Mg Caps (Dutasteride) .Marland Kitchen.. 1 Once Daily Alternating W Finasteride 12)  Celexa 10 Mg Tabs (Citalopram Hydrobromide) .Marland Kitchen.. 1 Once Daily  Allergies (verified): 1)  ! Sulfa 2)  ! Dilaudid (Hydromorphone Hcl)  Past History:  Family History: Last updated: Dec 25, 2008 father dies of CAD at age 19 Family History of CAD Male 1st degree relative <50 mother died at 76 of colon obstruction  Social History: Last updated: 12/28/2008 Married Former Smoker quit at age 62 Retired [place]  Risk Factors: Smoking Status: quit (07/20/2010) Passive Smoke Exposure: no (07/20/2010)  Past medical, surgical, family and social histories (including risk  factors) reviewed, and no changes noted (except as noted below).  Past Medical History: Reviewed history from 2008/12/25 and no changes required. Current Problems:  CAD (ICD-414.00) HYPERTENSION (ICD-401.9) HYPERLIPIDEMIA (ICD-272.4) PEPTIC ULCER DISEASE, HX OF (ICD-V12.71) DIVERTICULOSIS, MILD (ICD-562.10) GERD (ICD-530.81) DIZZINESS (ICD-780.4) CONDUCTIVE HEARING LOSS EXTERNAL EAR (ICD-389.01) DEGENERATIVE JOINT DISEASE, HANDS (ICD-715.94) LOSS, CONDUCTIVE HEARING, COMBINED TYPE (ICD-389.08) FAMILY HISTORY OF CAD MALE 1ST DEGREE RELATIVE <50 (ICD-V17.3) HX, PERSONAL, URINARY CALCULI (ICD-V13.01) HEADACHE (ICD-784.0) OSTEOARTHRITIS (ICD-715.90) ALLERGIC RHINITIS (ICD-477.9)  Past Surgical History: Reviewed history from 04/16/2007 and no changes required. Total knee replacement Cataract extraction arthoscopic surgery right knee  Family History: Reviewed history from 12-25-2008 and no changes required. father dies of CAD at age 59 Family History of CAD Male 1st degree relative <50 mother died at 30 of colon obstruction  Social History: Reviewed history from 12/28/2008 and no changes required. Married Former Smoker quit at age 46 Retired Spends time in [employer]  Review of Systems  The patient denies anorexia, fever, weight loss, weight gain, vision loss, decreased hearing, hoarseness, chest pain, syncope, dyspnea on exertion, peripheral edema, prolonged cough, headaches, hemoptysis, abdominal pain, melena, hematochezia, severe indigestion/heartburn, hematuria, incontinence, genital sores, muscle weakness, suspicious skin lesions, transient blindness, difficulty walking, depression, unusual weight change, abnormal bleeding, enlarged lymph nodes, angioedema, and breast masses.    Physical Exam  General:  Affect appropriate Healthy:  appears stated age HEENT: normal Neck supple with no adenopathy JVP normal no bruits no thyromegaly Lungs clear with no wheezing and good  diaphragmatic motion Heart:  S1/S2 no murmur,rub, gallop or click PMI normal Abdomen: benighn, BS positve, no tenderness, no AAA no bruit.  No HSM or HJR Distal pulses intact with no bruits No edema Neuro non-focal Skin warm and dry  Head:  normocephalic and male-pattern balding.   Eyes:  pupils equal and pupils round.   Nose:  no external deformity.  nasal dischargemucosal pallor.   Mouth:  posterior lymphoid hypertrophy and postnasal drip.   Neck:  No deformities, masses, or tenderness noted. Lungs:  normal respiratory effort and no wheezes.   Heart:  normal rate and regular rhythm.   Abdomen:  soft and normal bowel sounds.   Msk:  the thumbs are tender with monderate solf tissue swelling Extremities:  No clubbing, cyanosis, edema, or deformity noted with normal full range of motion of all joints.   Neurologic:  No cranial nerve deficits noted. Station and gait are normal. Plantar reflexes are down-going bilaterally. DTRs are symmetrical throughout. Sensory, motor and coordinative functions appear intact.   Impression & Recommendations:  Problem # 1:  OSTEOARTHRITIS, HANDS, BILATERAL (ICD-715.94)  His updated medication list for this problem includes:    Bayer Aspirin 325 Mg Tabs (Aspirin) .Marland Kitchen... Take 1 by mouth once daily  Discussed use of medications, application of heat or cold, and exercises.   Problem # 2:  HYPERTENSION (ICD-401.9)  His updated medication list for this problem includes:    Benicar 20 Mg Tabs (Olmesartan medoxomil) .Marland Kitchen... 1/2 by mouth daily.Marland Kitchen...which is 10mg  daily  BP today: 120/70 Prior BP: 124/80 (03/01/2010)  Prior 10 Yr Risk Heart Disease: N/A (12/17/2007)  Labs Reviewed: K+: 4.0 (03/01/2010) Creat: : 1.0 (03/01/2010)   Chol: 147 (03/01/2010)   HDL: 38.40 (03/01/2010)   LDL: 93 (05/06/2008)   TG: 108 (05/06/2008)  Orders: EKG w/ Interpretation (93000) Specimen Handling (27253) Cardiology Referral (Cardiology) TLB-TSH (Thyroid Stimulating  Hormone) (84443-TSH) TLB-CBC Platelet - w/Differential (85025-CBCD) TLB-BMP (Basic Metabolic Panel-BMET) (80048-METABOL)  Problem # 3:  OSTEOARTHRITIS (ICD-715.90)  His updated medication list for this problem includes:    Bayer Aspirin 325 Mg Tabs (Aspirin) .Marland Kitchen... Take 1 by mouth once daily  Discussed use of medications, application of heat or cold, and exercises.   Problem # 4:  SINUS BRADYCARDIA (ICD-427.81)  His updated medication list for this problem includes:    Bayer Aspirin 325 Mg Tabs (Aspirin) .Marland Kitchen... Take 1 by mouth once daily    Plavix 75 Mg Tabs (Clopidogrel bisulfate) ..... Once daily  Orders: Venipuncture (66440) Specimen Handling (34742) Cardiology Referral (Cardiology) TLB-TSH (Thyroid Stimulating Hormone) (84443-TSH) TLB-CBC Platelet - w/Differential (85025-CBCD) TLB-BMP (Basic Metabolic Panel-BMET) (80048-METABOL)  Labs Reviewed: Na: 143 (03/01/2010)   K+: 4.0 (03/01/2010)   CL: 105 (03/01/2010)   HCO3: 28 (03/01/2010) Ca: 9.6 (03/01/2010)   TSH: 0.91 (03/01/2010)   HCO3: 28 (03/01/2010)  Complete Medication List: 1)  Lipitor 10 Mg Tabs (Atorvastatin calcium) .... One  by mouth daily 2)  Multivitamins Tabs (  Multiple vitamin) .... Once daily 3)  Bayer Aspirin 325 Mg Tabs (Aspirin) .... Take 1 by mouth once daily 4)  Claritin 10 Mg Tabs (Loratadine) .... As needed 5)  Ultravate 0.05 % Crea (Halobetasol propionate) .... Apply as directed 6)  Plavix 75 Mg Tabs (Clopidogrel bisulfate) .... Once daily 7)  Zantac 150 Mg Tabs (Ranitidine hcl) .Marland Kitchen.. 1 once daily 8)  Benicar 20 Mg Tabs (Olmesartan medoxomil) .... 1/2 by mouth daily.Marland Kitchen...which is 10mg  daily 9)  Fish Oil 1200  .... Two by mouth bid 10)  Imitrex 25 Mg Tabs (Sumatriptan succinate) .... As needed 11)  Avodart 0.5 Mg Caps (Dutasteride) .Marland Kitchen.. 1 once daily alternating w finasteride 12)  Celexa 10 Mg Tabs (Citalopram hydrobromide) .Marland Kitchen.. 1 once daily  Hypertension Assessment/Plan:      The patient's hypertensive  risk group is category C: Target organ damage and/or diabetes.  Today's blood pressure is 120/70.  His blood pressure goal is < 140/90.   Patient Instructions: 1)  Please schedule a follow-up appointment in 2 months.   Orders Added: 1)  EKG w/ Interpretation [93000] 2)  Est. Patient Level IV [47829] 3)  Venipuncture [56213] 4)  Specimen Handling [99000] 5)  Cardiology Referral [Cardiology] 6)  TLB-TSH (Thyroid Stimulating Hormone) [84443-TSH] 7)  TLB-CBC Platelet - w/Differential [85025-CBCD] 8)  TLB-BMP (Basic Metabolic Panel-BMET) [80048-METABOL]

## 2010-10-11 NOTE — Progress Notes (Signed)
  Phone Note Call from Patient   Summary of Call: wife faxed complaint that pt was stung by yellow jackets yesterday and now swollen and red evey after taking benadryl- per dr Lovell Sheehan callin medrol dose pack and pt wife informed Initial call taken by: Willy Eddy, LPN,  June 07, 2010 5:05 PM    New/Updated Medications: MEDROL (PAK) 4 MG TABS (METHYLPREDNISOLONE) 6 day dose pack-take as directed Prescriptions: MEDROL (PAK) 4 MG TABS (METHYLPREDNISOLONE) 6 day dose pack-take as directed  #1pak x 0   Entered by:   Willy Eddy, LPN   Authorized by:   Stacie Glaze MD   Signed by:   Willy Eddy, LPN on 21/30/8657   Method used:   Electronically to        [facility]  [phone]* (retail)       [address]       [place], [place]  [address]       Ph: [phone] or [phone]       Fax: [phone]   RxID:   [id number]

## 2010-10-13 NOTE — Assessment & Plan Note (Signed)
Summary: 2 month fup//ccm   Vital Signs:  Patient profile:   77 year old male Height:      73 inches Weight:      216 pounds BMI:     28.60 Temp:     98.2 degrees F oral Pulse rate:   64 / minute Resp:     14 per minute BP sitting:   130 / 74  (left arm)  Vitals Entered By: Willy Eddy, LPN (September 26, 2010 2:38 PM) CC: roa, Hypertension Management Is Patient Diabetic? No   Primary Care Provider:  Stacie Glaze MD  CC:  roa and Hypertension Management.  History of Present Illness: stress testing normal and the holter  was non diagnosistic The bradycardia was not felt to be a contributing factor in the fatigue. The cardiologist released him to follow up with increaseinf exercize  Hypertension History:      He denies headache, chest pain, palpitations, dyspnea with exertion, orthopnea, PND, peripheral edema, visual symptoms, neurologic problems, syncope, and side effects from treatment.        Positive major cardiovascular risk factors include male age 43 years old or older, hyperlipidemia, and hypertension.  Negative major cardiovascular risk factors include negative family history for ischemic heart disease and non-tobacco-user status.        Positive history for target organ damage include ASHD (either angina/prior MI/prior CABG).     Preventive Screening-Counseling & Management  Alcohol-Tobacco     Smoking Status: quit     Year Quit: 1963     Passive Smoke Exposure: no  Problems Prior to Update: 1)  Sinus Bradycardia  (ICD-427.81) 2)  Bronchitis, Acute With Mild Bronchospasm  (ICD-466.0) 3)  Benign Prostatic Hypertrophy, With Obstruction  (ICD-600.01) 4)  Herpes Zoster  (ICD-053.9) 5)  Osteoarthritis, Hands, Bilateral  (ICD-715.94) 6)  Rbbb  (ICD-426.4) 7)  Cad  (ICD-414.00) 8)  Hypertension  (ICD-401.9) 9)  Hyperlipidemia  (ICD-272.4) 10)  Peptic Ulcer Disease, Hx of  (ICD-V12.71) 11)  Diverticulosis, Mild  (ICD-562.10) 12)  Gerd  (ICD-530.81) 13)   Dizziness  (ICD-780.4) 14)  Conductive Hearing Loss External Ear  (ICD-389.01) 15)  Degenerative Joint Disease, Hands  (ICD-715.94) 16)  Loss, Conductive Hearing, Combined Type  (ICD-389.08) 17)  Family History of Cad Male 1st Degree Relative <50  (ICD-V17.3) 18)  Hx, Personal, Urinary Calculi  (ICD-V13.01) 19)  Headache  (ICD-784.0) 20)  Osteoarthritis  (ICD-715.90) 21)  Allergic Rhinitis  (ICD-477.9)  Current Problems (verified): 1)  Sinus Bradycardia  (ICD-427.81) 2)  Bronchitis, Acute With Mild Bronchospasm  (ICD-466.0) 3)  Benign Prostatic Hypertrophy, With Obstruction  (ICD-600.01) 4)  Herpes Zoster  (ICD-053.9) 5)  Osteoarthritis, Hands, Bilateral  (ICD-715.94) 6)  Rbbb  (ICD-426.4) 7)  Cad  (ICD-414.00) 8)  Hypertension  (ICD-401.9) 9)  Hyperlipidemia  (ICD-272.4) 10)  Peptic Ulcer Disease, Hx of  (ICD-V12.71) 11)  Diverticulosis, Mild  (ICD-562.10) 12)  Gerd  (ICD-530.81) 13)  Dizziness  (ICD-780.4) 14)  Conductive Hearing Loss External Ear  (ICD-389.01) 15)  Degenerative Joint Disease, Hands  (ICD-715.94) 16)  Loss, Conductive Hearing, Combined Type  (ICD-389.08) 17)  Family History of Cad Male 1st Degree Relative <50  (ICD-V17.3) 18)  Hx, Personal, Urinary Calculi  (ICD-V13.01) 19)  Headache  (ICD-784.0) 20)  Osteoarthritis  (ICD-715.90) 21)  Allergic Rhinitis  (ICD-477.9)  Medications Prior to Update: 1)  Lipitor 10 Mg  Tabs (Atorvastatin Calcium) .... One  By Mouth Daily 2)  Multivitamins   Tabs (Multiple Vitamin) .Marland KitchenMarland KitchenMarland Kitchen  Once Daily 3)  Bayer Aspirin 325 Mg Tabs (Aspirin) .... Take 1 By Mouth Once Daily 4)  Claritin 10 Mg  Tabs (Loratadine) .... As Needed 5)  Ultravate 0.05 %  Crea (Halobetasol Propionate) .... Apply As Directed 6)  Plavix 75 Mg  Tabs (Clopidogrel Bisulfate) .... Once Daily 7)  Zantac 150 Mg Tabs (Ranitidine Hcl) .Marland Kitchen.. 1 Once Daily 8)  Benicar 20 Mg  Tabs (Olmesartan Medoxomil) .... 1/2 By Mouth Daily.Marland Kitchen...which Is 10mg  Daily 9)  Fish Oil 1200 ....  Two By Mouth Bid 10)  Imitrex 25 Mg Tabs (Sumatriptan Succinate) .... As Needed 11)  Avodart 0.5 Mg Caps (Dutasteride) .Marland Kitchen.. 1 Once Daily Alternating W Finasteride 12)  Celexa 10 Mg Tabs (Citalopram Hydrobromide) .... As Needed  Current Medications (verified): 1)  Lipitor 10 Mg  Tabs (Atorvastatin Calcium) .... One  By Mouth Daily 2)  Multivitamins   Tabs (Multiple Vitamin) .... Once Daily 3)  Bayer Aspirin 325 Mg Tabs (Aspirin) .... Take 1 By Mouth Once Daily 4)  Claritin 10 Mg  Tabs (Loratadine) .... As Needed 5)  Ultravate 0.05 %  Crea (Halobetasol Propionate) .... Apply As Directed 6)  Plavix 75 Mg  Tabs (Clopidogrel Bisulfate) .... Once Daily 7)  Zantac 150 Mg Tabs (Ranitidine Hcl) .Marland Kitchen.. 1 Once Daily 8)  Benicar 20 Mg  Tabs (Olmesartan Medoxomil) .... 1/2 By Mouth Daily.Marland Kitchen...which Is 10mg  Daily 9)  Fish Oil 1200 .... Two By Mouth Bid 10)  Imitrex 25 Mg Tabs (Sumatriptan Succinate) .... As Needed 11)  Avodart 0.5 Mg Caps (Dutasteride) .Marland Kitchen.. 1 Once Daily Alternating W Finasteride 12)  Celexa 10 Mg Tabs (Citalopram Hydrobromide) .... As Needed  Allergies (verified): 1)  ! Sulfa 2)  ! Dilaudid (Hydromorphone Hcl)  Past History:  Family History: Last updated: Dec 21, 2008 father dies of CAD at age 33 Family History of CAD Male 1st degree relative <50 mother died at 23 of colon obstruction  Social History: Last updated: 12/28/2008 Married Former Smoker quit at age 58 Retired [place]  Risk Factors: Smoking Status: quit (09/26/2010) Passive Smoke Exposure: no (09/26/2010)  Past medical, surgical, family and social histories (including risk factors) reviewed, and no changes noted (except as noted below).  Past Medical History: Reviewed history from 21-Dec-2008 and no changes required. Current Problems:  CAD (ICD-414.00) HYPERTENSION (ICD-401.9) HYPERLIPIDEMIA (ICD-272.4) PEPTIC ULCER DISEASE, HX OF (ICD-V12.71) DIVERTICULOSIS, MILD (ICD-562.10) GERD  (ICD-530.81) DIZZINESS (ICD-780.4) CONDUCTIVE HEARING LOSS EXTERNAL EAR (ICD-389.01) DEGENERATIVE JOINT DISEASE, HANDS (ICD-715.94) LOSS, CONDUCTIVE HEARING, COMBINED TYPE (ICD-389.08) FAMILY HISTORY OF CAD MALE 1ST DEGREE RELATIVE <50 (ICD-V17.3) HX, PERSONAL, URINARY CALCULI (ICD-V13.01) HEADACHE (ICD-784.0) OSTEOARTHRITIS (ICD-715.90) ALLERGIC RHINITIS (ICD-477.9)  Past Surgical History: Reviewed history from 04/16/2007 and no changes required. Total knee replacement Cataract extraction arthoscopic surgery right knee  Family History: Reviewed history from 12/21/2008 and no changes required. father dies of CAD at age 32 Family History of CAD Male 1st degree relative <50 mother died at 10 of colon obstruction  Social History: Reviewed history from 12/28/2008 and no changes required. Married Former Smoker quit at age 65 Retired [place] time [place]  Review of Systems  The patient denies anorexia, fever, weight loss, weight gain, vision loss, decreased hearing, hoarseness, chest pain, syncope, dyspnea on exertion, peripheral edema, prolonged cough, headaches, hemoptysis, abdominal pain, melena, hematochezia, severe indigestion/heartburn, hematuria, incontinence, genital sores, muscle weakness, suspicious skin lesions, transient blindness, difficulty walking, depression, unusual weight change, abnormal bleeding, enlarged lymph nodes, angioedema, and breast masses.    Physical Exam  General:  Affect appropriate Healthy:  appears stated age HEENT: normal Neck supple with no adenopathy JVP normal no bruits no thyromegaly Lungs clear with no wheezing and good diaphragmatic motion Heart:  S1/S2 no murmur,rub, gallop or click PMI normal Abdomen: benighn, BS positve, no tenderness, no AAA no bruit.  No HSM or HJR Distal pulses intact with no bruits No edema Neuro non-focal Skin warm and dry  Head:  normocephalic and male-pattern balding.   Eyes:  pupils equal and pupils  round.   Ears:  R ear normal and L ear normal.   Nose:  no external deformity.  nasal dischargemucosal pallor.   Mouth:  posterior lymphoid hypertrophy and postnasal drip.   Neck:  No deformities, masses, or tenderness noted. Lungs:  normal respiratory effort and no wheezes.   Heart:  normal rate and regular rhythm.   Abdomen:  soft and normal bowel sounds.   Msk:  the thumbs are tender with monderate solf tissue swelling Extremities:  No clubbing, cyanosis, edema, or deformity noted with normal full range of motion of all joints.   Neurologic:  No cranial nerve deficits noted. Station and gait are normal. Plantar reflexes are down-going bilaterally. DTRs are symmetrical throughout. Sensory, motor and coordinative functions appear intact.   Impression & Recommendations:  Problem # 1:  SINUS BRADYCARDIA (ICD-427.81) Assessment Unchanged  His updated medication list for this problem includes:    Bayer Aspirin 325 Mg Tabs (Aspirin) .Marland Kitchen... Take 1 by mouth once daily    Plavix 75 Mg Tabs (Clopidogrel bisulfate) ..... Once daily  ETT Interpretation: normal-no evidence of ischemia by ST analysis (09/21/2010)  ETT Comments: HR increased appropriately to maximum of 117 bpm.  No ischemia (09/21/2010)  Labs Reviewed: Na: 142 (07/20/2010)   K+: 4.3 (07/20/2010)   CL: 108 (07/20/2010)   HCO3: 27 (07/20/2010) Ca: 10.0 (07/20/2010)   TSH: 1.11 (07/20/2010)   HCO3: 27 (07/20/2010)  Problem # 2:  HYPERTENSION (ICD-401.9) Assessment: Unchanged  His updated medication list for this problem includes:    Benicar 20 Mg Tabs (Olmesartan medoxomil) .Marland Kitchen... 1/2 by mouth daily.Marland Kitchen...which is 10mg  daily  BP today: 130/74 Prior BP: 133/66 (09/21/2010)  Prior 10 Yr Risk Heart Disease: N/A (12/17/2007)  Labs Reviewed: K+: 4.3 (07/20/2010) Creat: : 0.9 (07/20/2010)   Chol: 147 (03/01/2010)   HDL: 38.40 (03/01/2010)   LDL: 93 (05/06/2008)   TG: 108 (05/06/2008)  Problem # 3:  RBBB (ICD-426.4) stable  bradycardia without evidence of malignant rythym  Complete Medication List: 1)  Lipitor 10 Mg Tabs (Atorvastatin calcium) .... One  by mouth daily 2)  Multivitamins Tabs (Multiple vitamin) .... Once daily 3)  Bayer Aspirin 325 Mg Tabs (Aspirin) .... Take 1 by mouth once daily 4)  Claritin 10 Mg Tabs (Loratadine) .... As needed 5)  Ultravate 0.05 % Crea (Halobetasol propionate) .... Apply as directed 6)  Plavix 75 Mg Tabs (Clopidogrel bisulfate) .... Once daily 7)  Zantac 150 Mg Tabs (Ranitidine hcl) .Marland Kitchen.. 1 once daily 8)  Benicar 20 Mg Tabs (Olmesartan medoxomil) .... 1/2 by mouth daily.Marland Kitchen...which is 10mg  daily 9)  Fish Oil 1200  .... Two by mouth bid 10)  Imitrex 25 Mg Tabs (Sumatriptan succinate) .... As needed 11)  Avodart 0.5 Mg Caps (Dutasteride) .Marland Kitchen.. 1 once daily alternating w finasteride 12)  Celexa 10 Mg Tabs (Citalopram hydrobromide) .... As needed  Hypertension Assessment/Plan:      The patient's hypertensive risk group is category C: Target organ damage and/or diabetes.  Today's blood pressure  is 130/74.  His blood pressure goal is < 140/90.  Patient Instructions: 1)  Please schedule a follow-up appointment in 4 months. 2)  BMP prior to visit, ICD-9:401.9 3)  Hepatic Panel prior to visit, ICD-9:995.20 4)  Lipid Panel prior to visit, ICD-9:272.4 5)  CBC w/ Diff prior to visit, ICD-9:530.84   Orders Added: 1)  Est. Patient Level IV [16109]

## 2010-10-13 NOTE — Letter (Signed)
Summary: Letter from patient's wife requesting refill on Lipitor  Letter from patient's wife requesting refill on Lipitor   Imported By: Maryln Gottron 09/16/2010 13:55:16  _____________________________________________________________________  External Attachment:    Type:   Image     Comment:   External Document

## 2010-10-13 NOTE — Assessment & Plan Note (Signed)
Summary: f/u event monitor sl   Visit Type:  Follow-up Primary Baltasar Twilley:  Stacie Glaze MD   History of Present Illness: Sean Lane is seen for F/U per Dr Lovell Sheehan  He has had more fatigue.  Dr Lovell Sheehan felt it may be due to bradycardia and PAC's.  The clinical scenario is not consistant with this.  The paitetn wakes up fatigued and sometime has to "drag himself out of bed".  He actually feels better with exercise that he does 3x/week.  There has been no syncope.  He does get palpiations but no rapid sustained ones.  I reviewed his event monitor and he had NSR with PAC's no long pauses and no prolonged SVT.  He has chronic RBBB with no evidence of high grade heart block.    Marsh has known coronary artery disease.  He had angioplasty and stent in the obtuse marginal branch in March 2009.  Denied any significant recurrent chest pain.  Slight being on Plavix for a year will continue it.  He did get a drug-eluting stent. Cholesterol has been at target on statin.  He has also had dizzyness in the past on higher dose Benicar.    Previous fatigue improved with FeSo4 Rx  Current Problems (verified): 1)  Sinus Bradycardia  (ICD-427.81) 2)  Bronchitis, Acute With Mild Bronchospasm  (ICD-466.0) 3)  Benign Prostatic Hypertrophy, With Obstruction  (ICD-600.01) 4)  Herpes Zoster  (ICD-053.9) 5)  Osteoarthritis, Hands, Bilateral  (ICD-715.94) 6)  Rbbb  (ICD-426.4) 7)  Cad  (ICD-414.00) 8)  Hypertension  (ICD-401.9) 9)  Hyperlipidemia  (ICD-272.4) 10)  Peptic Ulcer Disease, Hx of  (ICD-V12.71) 11)  Diverticulosis, Mild  (ICD-562.10) 12)  Gerd  (ICD-530.81) 13)  Dizziness  (ICD-780.4) 14)  Conductive Hearing Loss External Ear  (ICD-389.01) 15)  Degenerative Joint Disease, Hands  (ICD-715.94) 16)  Loss, Conductive Hearing, Combined Type  (ICD-389.08) 17)  Family History of Cad Male 1st Degree Relative <50  (ICD-V17.3) 18)  Hx, Personal, Urinary Calculi  (ICD-V13.01) 19)  Headache  (ICD-784.0) 20)   Osteoarthritis  (ICD-715.90) 21)  Allergic Rhinitis  (ICD-477.9)  Current Medications (verified): 1)  Lipitor 10 Mg  Tabs (Atorvastatin Calcium) .... One  By Mouth Daily 2)  Multivitamins   Tabs (Multiple Vitamin) .... Once Daily 3)  Bayer Aspirin 325 Mg Tabs (Aspirin) .... Take 1 By Mouth Once Daily 4)  Claritin 10 Mg  Tabs (Loratadine) .... As Needed 5)  Ultravate 0.05 %  Crea (Halobetasol Propionate) .... Apply As Directed 6)  Plavix 75 Mg  Tabs (Clopidogrel Bisulfate) .... Once Daily 7)  Zantac 150 Mg Tabs (Ranitidine Hcl) .Marland Kitchen.. 1 Once Daily 8)  Benicar 20 Mg  Tabs (Olmesartan Medoxomil) .... 1/2 By Mouth Daily.Marland Kitchen...which Is 10mg  Daily 9)  Fish Oil 1200 .... Two By Mouth Bid 10)  Imitrex 25 Mg Tabs (Sumatriptan Succinate) .... As Needed 11)  Avodart 0.5 Mg Caps (Dutasteride) .Marland Kitchen.. 1 Once Daily Alternating W Finasteride 12)  Celexa 10 Mg Tabs (Citalopram Hydrobromide) .... As Needed  Allergies: 1)  ! Sulfa 2)  ! Dilaudid (Hydromorphone Hcl)  Past History:  Past Medical History: Last updated: 12/15/2008 Current Problems:  CAD (ICD-414.00) HYPERTENSION (ICD-401.9) HYPERLIPIDEMIA (ICD-272.4) PEPTIC ULCER DISEASE, HX OF (ICD-V12.71) DIVERTICULOSIS, MILD (ICD-562.10) GERD (ICD-530.81) DIZZINESS (ICD-780.4) CONDUCTIVE HEARING LOSS EXTERNAL EAR (ICD-389.01) DEGENERATIVE JOINT DISEASE, HANDS (ICD-715.94) LOSS, CONDUCTIVE HEARING, COMBINED TYPE (ICD-389.08) FAMILY HISTORY OF CAD MALE 1ST DEGREE RELATIVE <50 (ICD-V17.3) HX, PERSONAL, URINARY CALCULI (ICD-V13.01) HEADACHE (ICD-784.0) OSTEOARTHRITIS (ICD-715.90) ALLERGIC RHINITIS (ICD-477.9)  Past Surgical History: Last updated: 04/16/2007 Total knee replacement Cataract extraction arthoscopic surgery right knee  Family History: Last updated: January 07, 2009 father dies of CAD at age 69 Family History of CAD Male 1st degree relative <50 mother died at 8 of colon obstruction  Social History: Last updated:  12/28/2008 Married Former Smoker quit at age 72 Retired [place]  Review of Systems       Denies fever, malais, weight loss, blurry vision, decreased visual acuity, cough, sputum, SOB, hemoptysis, pleuritic pain, palpitaitons, heartburn, abdominal pain, melena, lower extremity edema, claudication, or rash.   Vital Signs:  Patient profile:   77 year old male Height:      73 inches Weight:      215 pounds BMI:     28.47 Pulse rate:   60 / minute Pulse rhythm:   irregular Resp:     18 per minute BP sitting:   118 / 80  (left arm) Cuff size:   large  Vitals Entered By: Vikki Ports (August 25, 2010 10:45 AM)  Physical Exam  General:  Affect appropriate Healthy:  appears stated age HEENT: normal Neck supple with no adenopathy JVP normal no bruits no thyromegaly Lungs clear with no wheezing and good diaphragmatic motion Heart:  S1/S2 no murmur,rub, gallop or click PMI normal Abdomen: benighn, BS positve, no tenderness, no AAA no bruit.  No HSM or HJR Distal pulses intact with no bruits No edema Neuro non-focal Skin warm and dry    Impression & Recommendations:  Problem # 1:  SINUS BRADYCARDIA (ICD-427.81) Should not be contributing to fatigue.  Event monitor benign  ETT to R/O chronotropic incompetence His updated medication list for this problem includes:    Bayer Aspirin 325 Mg Tabs (Aspirin) .Marland Kitchen... Take 1 by mouth once daily    Plavix 75 Mg Tabs (Clopidogrel bisulfate) ..... Once daily  Orders: Treadmill (Treadmill)  Problem # 2:  RBBB (ICD-426.4) Stable no long pauses or evidence of high grade heart block.  No indication for pacer His updated medication list for this problem includes:    Bayer Aspirin 325 Mg Tabs (Aspirin) .Marland Kitchen... Take 1 by mouth once daily    Plavix 75 Mg Tabs (Clopidogrel bisulfate) ..... Once daily  Problem # 3:  CAD (ICD-414.00) Stabel no angina His updated medication list for this problem includes:    Bayer Aspirin  325 Mg Tabs (Aspirin) .Marland Kitchen... Take 1 by mouth once daily    Plavix 75 Mg Tabs (Clopidogrel bisulfate) ..... Once daily  Problem # 4:  HYPERTENSION (ICD-401.9) Well controlled  Avoid higher doses of benicar as it causes dizzyness His updated medication list for this problem includes:    Bayer Aspirin 325 Mg Tabs (Aspirin) .Marland Kitchen... Take 1 by mouth once daily    Benicar 20 Mg Tabs (Olmesartan medoxomil) .Marland Kitchen... 1/2 by mouth daily.Marland Kitchen...which is 10mg  daily  Problem # 5:  HYPERLIPIDEMIA (ICD-272.4) At goal with no side effects His updated medication list for this problem includes:    Lipitor 10 Mg Tabs (Atorvastatin calcium) ..... One  by mouth daily  Patient Instructions: 1)  Your physician has requested that you have an exercise tolerance test.  For further information please visit [email or web address]  Please also follow instruction sheet, as given.

## 2010-10-13 NOTE — Progress Notes (Signed)
Summary: RX Refill  Phone Note Refill Request Call back at Home Phone [phone] Message from:  Patient on September 14, 2010 10:28 AM  Refills Requested: Medication #1:  AVODART 0.5 MG CAPS 1 once daily alternating w finasteride  Medication #2:  LIPITOR 10 MG  TABS one  by mouth daily  Method Requested: Electronic Initial call taken by: Trixie Dredge,  September 14, 2010 10:29 AM    Prescriptions: AVODART 0.5 MG CAPS (DUTASTERIDE) 1 once daily alternating w finasteride  #90 Capsule x 2   Entered by:   Willy Eddy, LPN   Authorized by:   Stacie Glaze MD   Signed by:   Willy Eddy, LPN on 09/81/1914   Method used:   Electronically to        [facility]  [phone]* (retail)       [address]       [place], [place]  [address]       Ph: [phone] or [phone]       Fax: [phone]   RxID:   [id number] AVODART 0.5 MG CAPS (DUTASTERIDE) 1 once daily alternating w finasteride  #90 Capsule x 2   Entered by:   Willy Eddy, LPN   Authorized by:   Stacie Glaze MD   Signed by:   Willy Eddy, LPN on 56/43/3295   Method used:   Print then Give to Patient   RxID:   [id number]

## 2010-12-02 ENCOUNTER — Other Ambulatory Visit: Payer: Self-pay | Admitting: Internal Medicine

## 2011-01-17 ENCOUNTER — Other Ambulatory Visit (INDEPENDENT_AMBULATORY_CARE_PROVIDER_SITE_OTHER): Payer: Medicare Other | Admitting: Internal Medicine

## 2011-01-17 ENCOUNTER — Other Ambulatory Visit: Payer: Self-pay

## 2011-01-17 DIAGNOSIS — R7309 Other abnormal glucose: Secondary | ICD-10-CM

## 2011-01-17 DIAGNOSIS — E785 Hyperlipidemia, unspecified: Secondary | ICD-10-CM

## 2011-01-17 LAB — MICROALBUMIN / CREATININE URINE RATIO
Creatinine,U: 115.2 mg/dL
Microalb Creat Ratio: 3 mg/g (ref 0.0–30.0)
Microalb, Ur: 3.4 mg/dL — ABNORMAL HIGH (ref 0.0–1.9)

## 2011-01-17 LAB — CBC WITH DIFFERENTIAL/PLATELET
Basophils Absolute: 0 10*3/uL (ref 0.0–0.1)
Basophils Relative: 0.5 % (ref 0.0–3.0)
Eosinophils Absolute: 0.1 10*3/uL (ref 0.0–0.7)
Eosinophils Relative: 1.9 % (ref 0.0–5.0)
HCT: 45.9 % (ref 39.0–52.0)
Hemoglobin: 15.9 g/dL (ref 13.0–17.0)
Lymphocytes Relative: 36.5 % (ref 12.0–46.0)
Lymphs Abs: 1.7 10*3/uL (ref 0.7–4.0)
MCHC: 34.6 g/dL (ref 30.0–36.0)
MCV: 95.2 fl (ref 78.0–100.0)
Monocytes Absolute: 0.3 10*3/uL (ref 0.1–1.0)
Monocytes Relative: 7.4 % (ref 3.0–12.0)
Neutro Abs: 2.4 10*3/uL (ref 1.4–7.7)
Neutrophils Relative %: 53.7 % (ref 43.0–77.0)
Platelets: 151 10*3/uL (ref 150.0–400.0)
RBC: 4.82 Mil/uL (ref 4.22–5.81)
RDW: 13.7 % (ref 11.5–14.6)
WBC: 4.5 10*3/uL (ref 4.5–10.5)

## 2011-01-17 LAB — POCT URINALYSIS DIPSTICK
Bilirubin, UA: NEGATIVE
Glucose, UA: NEGATIVE
Ketones, UA: NEGATIVE
Leukocytes, UA: NEGATIVE
Nitrite, UA: NEGATIVE
Protein, UA: NEGATIVE
Spec Grav, UA: 1.02
Urobilinogen, UA: 0.2
pH, UA: 5.5

## 2011-01-17 LAB — LIPID PANEL
Cholesterol: 140 mg/dL (ref 0–200)
HDL: 34.3 mg/dL — ABNORMAL LOW
LDL Cholesterol: 74 mg/dL (ref 0–99)
Total CHOL/HDL Ratio: 4
Triglycerides: 160 mg/dL — ABNORMAL HIGH (ref 0.0–149.0)
VLDL: 32 mg/dL (ref 0.0–40.0)

## 2011-01-17 LAB — HEPATIC FUNCTION PANEL
ALT: 25 U/L (ref 0–53)
AST: 19 U/L (ref 0–37)
Albumin: 4.2 g/dL (ref 3.5–5.2)
Alkaline Phosphatase: 64 U/L (ref 39–117)
Bilirubin, Direct: 0.1 mg/dL (ref 0.0–0.3)
Total Bilirubin: 1.1 mg/dL (ref 0.3–1.2)
Total Protein: 6.6 g/dL (ref 6.0–8.3)

## 2011-01-17 LAB — TSH: TSH: 1.12 u[IU]/mL (ref 0.35–5.50)

## 2011-01-17 LAB — BASIC METABOLIC PANEL WITH GFR
BUN: 21 mg/dL (ref 6–23)
CO2: 28 meq/L (ref 19–32)
Calcium: 9.5 mg/dL (ref 8.4–10.5)
Chloride: 106 meq/L (ref 96–112)
Creatinine, Ser: 0.9 mg/dL (ref 0.4–1.5)
GFR: 85.08 mL/min
Glucose, Bld: 113 mg/dL — ABNORMAL HIGH (ref 70–99)
Potassium: 4.4 meq/L (ref 3.5–5.1)
Sodium: 141 meq/L (ref 135–145)

## 2011-01-17 LAB — HEMOGLOBIN A1C: Hgb A1c MFr Bld: 6.4 % (ref 4.6–6.5)

## 2011-01-18 ENCOUNTER — Encounter: Payer: Self-pay | Admitting: Internal Medicine

## 2011-01-23 ENCOUNTER — Ambulatory Visit (INDEPENDENT_AMBULATORY_CARE_PROVIDER_SITE_OTHER): Payer: Medicare Other | Admitting: Internal Medicine

## 2011-01-23 VITALS — BP 110/70 | HR 72 | Temp 98.0°F | Resp 16 | Ht 73.0 in | Wt 216.0 lb

## 2011-01-23 DIAGNOSIS — D649 Anemia, unspecified: Secondary | ICD-10-CM

## 2011-01-23 DIAGNOSIS — M19049 Primary osteoarthritis, unspecified hand: Secondary | ICD-10-CM

## 2011-01-23 DIAGNOSIS — R5383 Other fatigue: Secondary | ICD-10-CM

## 2011-01-23 DIAGNOSIS — I10 Essential (primary) hypertension: Secondary | ICD-10-CM

## 2011-01-23 NOTE — Progress Notes (Signed)
  Subjective:    Patient ID: Sean Lane, male    DOB: November 21, 1927, 77 y.o.   MRN: [id number]  HPI  Blood pressure check only  Review of Systems     Objective:   Physical Exam        Assessment & Plan:  No charge

## 2011-01-24 NOTE — Assessment & Plan Note (Signed)
[facility]                            CARDIOLOGY OFFICE NOTE   NAME:Brownrigg, RIKKI SMESTAD                   MRN:          [id number]  DATE:01/07/2008                            DOB:          July 05, 1928    Joevon returns today for followup.   He had been initially referred for an abnormal Myoview.   The patient had a drug-eluting stent placed to the circumflex coronary  artery and subsequently was seen in the emergency room by Dr. Gala Romney  for dizziness.  He had postural symptoms, and his Benicar was cut back.   He has been doing well.  He is not having any significant chest pain.  His dizziness is improved.  He still gets occasional postural symptoms  when he leans over.   He is only taking 1/4 of a 20 mg tablet of Benicar, and I told him  initially to stop this, and we will see what his blood pressure runs.   He otherwise has been doing well.  He is looking into getting his Plavix  from a [place] drug company.   REVIEW OF SYSTEMS:  Otherwise negative.   MEDICATIONS:  1. Benicar 5 mg a day, question to be stopped.  2. Lipitor 5 mg a day.  3. Plavix 75 a day.  4. Avodart 0.5 mg at night.  5. An aspirin a day.  6. Prilosec.  7. Multivitamins.   PHYSICAL EXAMINATION:  GENERAL:  Remarkable for a healthy-appearing  elderly white male in no distress.  VITAL SIGNS:  Weight is 211, blood pressure 126/78, pulse 66 and  regular, afebrile.  HEENT:  Unremarkable.  Carotids normal, without bruit.  No  lymphadenopathy, thyromegaly or JVP elevation.  LUNGS:  Clear.  Good diaphragmatic motion.  No wheezing.  HEART:  S1, S2, with normal heart sounds.  PMI normal.  ABDOMEN:  Benign.  Bowel sounds positive.  No AAA, no tenderness, no  hepatosplenomegaly, no hepatojugular reflux.  EXTREMITIES:  Distal pulses are intact.  No edema.  NEUROLOGIC:  Nonfocal.  SKIN:  Warm and dry.  MUSCULOSKELETAL:  No muscular weakness.   IMPRESSION:  1. Coronary  artery disease.  Drug-eluting stent to the circumflex.      Continue aspirin and Plavix.  2. Dizziness, likely postural, improved with low-dose Benicar.      Stopping Benicar to see if it goes away altogether.  3. Hypertension.  Currently seems improved.  The patient has had some      weight loss.  We will have to see whether he needs to be on any      Benicar, or whether he can just treat his blood pressure with a low-      salt diet.  4. History of prostatism.  Continue Avodart.  Urinary stream good.      PSA in 6 months.  5. History of reflux.  The patient is getting a bit of epigastric      pain, particularly after he takes his Plavix.  It may be reasonable      to switch him to Protonix.  He was given a new medication by  Dr.      Lovell Sheehan, and he will see how this does.  I told him that he is to      take his Plavix with food.  He has seen Dr. Terrial Rhodes in the      past for gastritis, and he may need for the referral to GI.   Overall, I think Ines is doing well.  His dizziness and chest pain  are improved, and he has his drug-eluting stent in place.  I will see  him back in 6 months.     Noralyn Pick. Eden Emms, MD, [facility]  Electronically Signed    PCN/MedQ  DD: 01/07/2008  DT: 01/07/2008  Job #: [id number]

## 2011-01-24 NOTE — Assessment & Plan Note (Signed)
[facility]                            CARDIOLOGY OFFICE NOTE   NAME:Sean Lane                   MRN:          [id number]  DATE:06/26/2008                            DOB:          1928/08/20    Sean Lane returns today for followup.  He had a drug-eluting  stent placed by Dr. Excell Seltzer to the circ in March.  He is doing well.  He  is not having significant chest pain.  Risk factors are well modified.  He just got remarried this spring.  His wife is quite pleasant and  really dotes on him.  They are going to the .  He has been compliant  with his meds.  He has less GERD, now that he takes his medications with  food.  I told we would keep him on Plavix at least until March 2010  which will be a year.  He has some easy bruising, but otherwise no  bleeding diathesis.   He tells me that his LDL and liver tests were fine as checked by Dr.  Lovell Sheehan.  His review of systems otherwise negative.   He is allergic to DILAUDID and SULFA.   He is on Benicar 10 mg a day, Lipitor 10 mg a day, Plavix 75 a day,  Avodart 5  a day, aspirin, Prilosec over the counter, and fish oil.   PHYSICAL EXAMINATION:  GENERAL:  Remarkable for a well-preserved,  elderly white male who is younger than his stated age.  VITAL SIGNS:  Blood pressure 140/70, pulse 60 and regular, respiratory  rate 14, afebrile.  Weight 220.  HEENT:  Unremarkable.  Carotids normal without bruit.  No  lymphadenopathy, thyromegaly, JVP elevation.  LUNGS:  Clear.  Good diaphragmatic motion.  No wheezing.  S1 and S2.  No  heart sounds.  PMI normal.  ABDOMEN:  Benign.  Bowel sounds positive.  No AAA, no tenderness, no  bruit, no hepatosplenomegaly, no hepatojugular reflux, no tenderness.  EXTREMITIES:  Distal pulses are intact.  No edema.  NEURO:  Nonfocal.  SKIN:  Warm and dry.  No muscular weakness.   LDL cholesterol was 82.   IMPRESSION:  1. Coronary artery disease, drug-eluting stent to  the circ, March      2009.  Continue aspirin and Plavix, not having angina.  Cardiac      rehab is done well and he is working out at sport time 3 times a      week.  2. Hypercholesterolemia.  LDL reasonable.  Lipitor increased from 5 to      10 mg.  3. Hypertension, currently well controlled.  Continue Benicar and low-      sodium diet.  4. Gastroesophageal reflux disease.  Continue over-the-counter      Protonix, seems improved.      Overall, I think [name] is doing well.  He goes by the nick name      Till.  I will see him back in March and we will make a decision      about how long to keep him on Plavix.     Theron Arista  Phillips Hay, MD, [facility]  Electronically Signed    PCN/MedQ  DD: 06/26/2008  DT: 06/27/2008  Job #: [id number]

## 2011-01-24 NOTE — Cardiovascular Report (Signed)
NAME:  Sean Lane, Sean Lane NO.:  [id number]   MEDICAL RECORD NO.:  [id number]          PATIENT TYPE:  INP   LOCATION:  6527                         FACILITY:  [facility]   PHYSICIAN:  Veverly Fells. Excell Seltzer, MD  DATE OF BIRTH:  [birth year]   DATE OF PROCEDURE:  12/10/2007  DATE OF DISCHARGE:                            CARDIAC CATHETERIZATION   PROCEDURE:  Left heart catheterization, selective coronary angiography,  left ventricular angiography, PTCA and stenting of the proximal and mid-  left circumflex and Angio-Seal of the right femoral artery.   INDICATIONS:  Sean Lane is a very nice 77 year old gentleman with  multiple cardiac risk factors.  He presented to the hospital with chest  pain.  He was found to have bradycardia and Mobitz I second degree heart  block.  In the setting of multiple risk factors, chest pain, and  conduction disease, he was referred for cardiac catheterization.   Risks and indications of the procedure were reviewed with the patient.  Informed consent was obtained.  The right groin was prepped, draped,  anesthetized with 1% lidocaine.  Using a modified Seldinger technique a  6-French sheath was placed in the right femoral artery.  Standard 6-  French Judkins catheters were used for coronary angiography and left  ventriculography.  A pullback across the aortic valve was done.  The  pigtail catheter was brought back to the abdominal aorta, and an  abdominal aortogram was performed.   At the completion of the diagnostic procedure, I elected to intervene on  the left circumflex.  There were tandem 80% stenoses in the proximal and  mid-vessel.  Angiomax was used for anticoagulation.  The patient was pre-  loaded with 600 mg of clopidogrel on a table.  The proximal lesion was  focal and located in a large portion of the vessel.  The mid-lesion was  more complex and involved two moderate-to-large size marginal branches.  Once a therapeutic ACT was achieved,  a 6-French 3.5 cm ACLS guide  catheter was inserted.  A Cougar guidewire was passed into the distal  left circumflex without difficulty.  Both lesions were pre-dilated with  a 2.5 x 12-mm Maverick balloon, taken to 8 atmospheres in the mid lesion  and 10 atmospheres in the proximal lesion.  The balloon length was used  to help size the stent.  I elected to stent the mid-lesion with a 2.75 x  13-mm Cypher stent.  This was carefully positioned between the two  marginal branches and deployed at 16 atmospheres.  The stent appeared  well expanded.  For the more proximal lesion, a 3.5 x 13-mm Cypher stent  was used, and it was deployed at 14 atmospheres and appeared well  expanded.  Following stenting, there was a good angiographic result with  TIMI III flow and no significant compromise of the side branches.  Both  areas involved calcified lesions, so I elected to post dilate both  stents.  For the stent in the mid-left circumflex, a 3.0 x 10-mm Dura  Star balloon was used and was inflated to 18 atmospheres in the mid-  portion of the stent.  For the more proximal lesion, a 4.0 x 10-mm Dura  Star balloon was used and it was inflated to 16 atmospheres.  At  the  completion of procedure, there was an excellent angiographic result with  TIMI III flow in the main circumflex and all branch vessels.  There was  no significant ostial compromise of the marginal branches.  The patient  tolerated procedure well and had no immediate complications.  An Angio-  Seal device was used to close the femoral arteriotomy.   FINDINGS:  Aortic pressure 125/60 with a mean of 85, left ventricular  pressure 124/10.   CORONARY ANGIOGRAPHY:  The right coronary artery:  The right coronary  artery is small and non-dominant.  There is mild stenosis of 50% in the  mid-portion of the vessel.  It supplies three branches to three right  ventricular branches.   The left mainstem has mild nonobstructive plaque of no more than  20%.  It appears widely patent and bifurcates into the LAD and left  circumflex.   The LAD is mildly calcified in the proximal and mid-portions.  It  courses down and wraps around the LV apex.  There is a moderate-sized  first diagonal, a small second diagonal, and a relatively large third  diagonal branch present.  There are two main septal perforator branches  that arise after the first diagonal.  The proximal LAD has 40% to 50%  diffuse stenosis but no high-grade lesions.  The mid-LAD has a 40%  stenosis, as well.  There are no significant-appearing lesions  throughout the LAD.   Left circumflex.  The left circumflex is large-caliber.  The proximal  circumflex has an eccentric calcified 80% stenosis that is very focal.  The circumflex supplies a tiny first OM and a moderate-sized second OM.  Just after the second OM, there is 80% eccentric calcified stenosis that  leads into a third OM branch that is also of moderate size.  The vessel  then courses down and supplies a left PDA and a left posterolateral  branch.  The left PDA has 40% stenosis.   Left ventriculography shows normal LV function.  The LVEF is estimated  at 60%.  There is no significant mitral regurgitation.   ASSESSMENT:  1. Severe left circumflex stenosis with successful percutaneous      coronary intervention of the proximal and mid-vessel.  2. Mild left anterior descending stenosis.  3. Non-dominant right coronary artery with mild stenosis.  4. Normal left ventricular function.   PLAN:  Sean Lane should be observed overnight after PCI.  He will be  continued on aspirin and Plavix for least 12 months.      Veverly Fells. Excell Seltzer, MD  Electronically Signed     MDC/MEDQ  D:  12/10/2007  T:  12/10/2007  Job:  [id number]   cc:   Stacie Glaze, MD

## 2011-01-24 NOTE — Discharge Summary (Signed)
NAME:  Sean Lane, Sean Lane            ACCOUNT NO.:  [id number]   MEDICAL RECORD NO.:  [id number]          PATIENT TYPE:  INP   LOCATION:  6527                         FACILITY:  [facility]   PHYSICIAN:  Bruce Rexene Edison. Swords, MD    DATE OF BIRTH:  Dec 26, 1927   DATE OF ADMISSION:  12/09/2007  DATE OF DISCHARGE:  12/11/2007                               DISCHARGE SUMMARY   DISCHARGE DIAGNOSES:  1. Coronary artery disease status post percutaneous intervention on      December 10, 2007.  2. Hypertension.  3. Hyperlipidemia.  4. Normal left ventricular function per cath on December 10, 2007.  5. Right coronary artery mild stenosis per cath December 10, 2007.   HISTORY OF PRESENT ILLNESS:  Sean Lane is a very pleasant 77 year old  white male with history of hypertension and hyperlipidemia.  He  presented to the emergency room on day of admission with 1 episode of  left-sided chest pain while at rest on morning of admission.  The  patient described pain as sudden onset with associated diaphoresis and  left upper arm numbness and tingling.  The patient reported pain and  numbness had resolved after approximately 20 minutes without any  intervention.  He denied any recurrent chest pain or any pain or  shortness of breath on arrival to the ER.  Denied any recent fatigue or  decreased exercise tolerance.  No recent cough, fever, headache,  dizziness, abdominal pain, nausea, vomiting, diarrhea, weight change, or  lower extremity swelling.  Due to the patient's multiple risk factors  for heart disease including hypertension, hyperlipidemia, morbid obesity  and with father deceased at 35 year old secondary to CAD, the patient  was admitted to the hospital for further evaluation and treatment.  Cardiology consultation was requested at time of admission.   PAST MEDICAL HISTORY:  1. Hypertension.  2. Hyperlipidemia.  3. Degenerative joint disease.  4. History of urinary calculi.  5. Headaches.  6. Allergic  rhinitis.  7. Osteoarthritis.  8. Total knee replacement 2006.  9. H. Pylori-induced peptic ulcer disease, now resolved.   COURSE OF HOSPITALIZATION PROBLEMS:  1. Atypical chest pain.  As mentioned above, Cardiology was asked to      see the patient in consultation at the time of admission due to the      patient's family history of CAD and with personal risk factors.      The patient's EKG on admission revealed sinus bradycardia with      right bundle branch block.  There were no old EKGs for comparison,      therefore, unsure whether or not these findings are new.  The      patient was admitted to telemetry unit.  Cardiac enzymes were      cycled and negative x3.  The patient's TSH was within normal      limits.  The patient underwent cardiac catheterization on December 10, 2007, at which time he underwent successful percutaneous      intervention of the left circumflex of both the proximal and the      mid vessel.  The patient with no complications status post      catheterization. Per cardiology, the patient is to continue on full-      dose aspirin in addition to Plavix for no less than 1 year from      date of catheterization.  The patient currently being educated on      cardiac diet by Cardiac Rehab RN with followup instructions, and      asked to call the office with any questions.  2. Hypertension.  As mentioned above, the patient has remained      bradycardic throughout this hospitalization.  The patient's Coreg      that he was on prior to this admission has been discontinued by      Cardiology.  3. Hyperlipidemia.  The patient's fasting lipid panel done during this      hospitalization revealed low HDL 29, otherwise within normal      limits.  He is instructed to continue on current statin medication      and encouraged to continue exercising.   MEDICATIONS:  At time of discharge.  1. Plavix 75 mg p.o. daily.  2. Lipitor 10 mg 1/2 tablet p.o. daily.  3. Avodart 0.5  mg p.o. daily.  4. Aspirin 325 mg p.o. daily.  5. Benicar 40 mg p.o. daily.  6. Claritin 10 mg p.o. daily.  7. Multivitamin p.o. daily.   The patient instructed to discontinue Coreg.   LABORATORY DATA:  Pertinent lab work at time of discharge, white cell  count 6.4, platelets 132, hemoglobin 14.9, hematocrit 42.4, sodium 139,  potassium 3.8, BUN 10, creatinine 0.94, hemoglobin A1c 6.0, TSH 0.78,  total cholesterol 131, LDL 75, HDL 29, and triglycerides 119.   DISPOSITION:  The patient felt medically stable for discharge home at  this time.  He is instructed to follow up with his primary care  physician, Dr. Darryll Lane on December 17, 2007, at 9:00 a.m.  In addition,  the patient is scheduled for followup with Dr. Charlton Lane on January 07, 2008, at 3:00 p.m.  The patient instructed to return the hospital for  any recurrent chest pain, shortness of breath, or diaphoresis.  The  patient's aspirin then need to be reduced to 81 mg should he develop any  recurrent peptic ulcer disease; however, it is suggested the patient to  remain on full-dose aspirin for at least 1 month.      Cordelia Pen, NP      Valetta Mole. Swords, MD  Electronically Signed    LE/MEDQ  D:  12/11/2007  T:  12/12/2007  Job:  [id number]   cc:   Sean Pick. Eden Emms, MD, [facility]  Sean Glaze, MD

## 2011-01-24 NOTE — Consult Note (Signed)
NAME:  HILLARD, GOODWINE NO.:  [id number]   MEDICAL RECORD NO.:  [id number]          PATIENT TYPE:  EMS   LOCATION:  MAJO                         FACILITY:  [facility]   PHYSICIAN:  Bevelyn Buckles. Bensimhon, MDDATE OF BIRTH:  [birth year]   DATE OF CONSULTATION:  12/19/2007  DATE OF DISCHARGE:                                 CONSULTATION   REASON FOR CONSULTATION:  Orthostasis.   Mr. Visser is a very pleasant 77 year old male with a history of  coronary artery disease who was recently admitted for unstable angina.  He underwent stenting of circumflex by Dr. Excell Seltzer.  EF of 60%.   Since discharge he has been doing well.  He is been very careful about  his diet cutting back on the fat and salt in his diet.  He went to  orientation at cardiac rehab today and felt orthostatic.  I took his  blood pressure and it dropped into the 90s when he stood up.  This has  been going on for several days.  He did not have any frank syncope.  No  chest pain or shortness of breath.  He was referred to the ER.  In the  ER he received 250 mL of normal saline bolus and feels much better.   REVIEW OF SYSTEMS:  Negative for chest pain, shortness of breath, heart  failure bleeding and focal neurologic symptoms.  Remainder of systems is  negative except for HPI and problem list.   PROBLEM LIST:  1. Coronary artery disease, status post stenting of his left      circumflex in March 2009, normal EF.  2. Hypertension.  3. Obstructive sleep apnea.  4. Hyperlipidemia.  5. History of peptic ulcer disease.  6. Migraines.  7. BPH.   MEDICATIONS:  1. Aspirin 325 mg.  2. Plavix 75.  3. Lipitor 5.  4. Avodart 0.5.  5. Claritin.  6. Multivitamin.  7. Benicar 40.  8. Prilosec.   ALLERGIES:  SULFA AND DILAUDID.   SOCIAL HISTORY:  Lives in [place] with his wife.  He is a retired  Art gallery manager, no alcohol.  Quit smoking earlier this year.   FAMILY HISTORY:  Mother died at 62.  Father died of 73 due to  coronary  artery disease.   PHYSICAL EXAM:  GENERAL:  He is well-appearing, in no acute distress,  ambulates around the ER without any difficulty.  VITAL SIGNS:  Blood pressure is 121/67, heart rate 65, satting 95% room  air.  HEENT:  Is normal.  NECK:  Is supple.  No JVD.  Carotids are 2+ bilaterally without bruits.  There is no lymphadenopathy or thyromegaly.  CARDIAC:  He has a regular rate and rhythm with no murmurs, rubs or  gallops.  LUNGS:  Clear.  ABDOMEN:  Soft, nontender, nondistended.  There is no  hepatosplenomegaly, no bruits.  No masses.  EXTREMITIES:  Warm with no cyanosis, clubbing or edema.  No rash.  NEURO:  Alert and oriented x3.  Cranial nerves II-XII are intact.  Moves  all four extremities without difficulty.  There is no pronator drift.  Affect is very  pleasant.   EKG shows sinus rhythm at a rate of 62 with a right bundle branch block.  No acute ST-T wave changes.  Sodium is 138, potassium 4.4, BUN 21,  creatinine 1.4, troponin is less than 0.05.  White count was 5.9,  hemoglobin 15.6.   ASSESSMENT AND PLAN:  Mr. Kun presents with orthostatic hypotension  and in the post angioplasty period.   I suspect that he has fairly salt sensitive hypertension and now since  he has changed his diet he is doing much better.  We instructed him to  cut back his Benicar to 20 mg a day and to make sure he is well-hydrated  before his cardiac rehab sessions.  Should he remain symptomatic then he  can just stop his Benicar completely.  He will follow-up with Dr. Eden Emms  as an outpatient.      Bevelyn Buckles. Bensimhon, MD  Electronically Signed     DRB/MEDQ  D:  12/19/2007  T:  12/20/2007  Job:  [id number]   cc:   Billee Cashing, MD

## 2011-01-24 NOTE — Consult Note (Signed)
NAME:  Sean Lane, Sean Lane NO.:  [id number]   MEDICAL RECORD NO.:  [id number]          PATIENT TYPE:  INP   LOCATION:  1844                         FACILITY:  [facility]   PHYSICIAN:  Noralyn Pick. Eden Emms, MD, FACCDATE OF BIRTH:  05/07/1928   DATE OF CONSULTATION:  12/09/2007  DATE OF DISCHARGE:                                 CONSULTATION   REFERRING PHYSICIAN:  Carleene Cooper, M.D.   HISTORY:  Ms. Monforte is a 77-year patient we are asked to see for heart  block, bradycardia, and chest pain.  The patient is extremely healthy.  he exercises on a regular basis.  There is no documented coronary  disease.  He has positive family history of coronary disease,  hypertension, and hyperlipidemia.  He had the onset of left-sided chest  pain radiating to the left arm while reading at about 9 o'clock this  morning. He described it as grabbing-type pain 9/10 in intensity.  It  was not changed with breathing or change in position.  He called EMS.  They provided him with O2, symptoms resolved in 20-30 minutes.  He has  never had this kind of pain before.  There was clearly no pleuritic  component to it.  No diaphoresis, no shortness of breath.   The patient had a stress test about 20 years ago, but no recent workup  for cardiac disease.   He has otherwise been well.  He has not had any recent trauma.  There is  no history of pericarditis or inflammatory problems.  There is no  history of valvular heart disease.   PAST MEDICAL HISTORY REMARKABLE FOR:  1. Hyperlipidemia.  2. Hypertension.  3. History of H. pylori with peptic ulcer disease.  4. History of migraines.  5. Nephrolithiasis.  6. Obstructive sleep apnea.  7. Hemorrhoids.  8. Diverticulosis.  9. The patient also has had previous foot surgery.  10.Vasectomy.  11.Left total knee replacement.  12.Cataracts.  13.Right knee arthroscopy.   ALLERGIES:  He is allergic to SULFA.   CURRENT MEDICATIONS:  1. He is on a baby  aspirin.  2. Avodart.  3. Coreg 6.25 b.i.d.  4. Lipitor 5 a day.  5. Benicar 40 a day.   The patient lives in [place].  He just got remarried in February.  I  met his wife, she is lovely.  He quit smoking and has a 10-pack-year  history.  He is a nondrinker.  He retired as a Best boy, and is  quite active physically.  His mother died at age 6.  Father died of  coronary disease at age 72.   REVIEW OF SYSTEMS:  Otherwise remarkable for some arthralgias in his  knees.   PHYSICAL EXAMINATION:  GENERAL:  His exam is remarkable for a healthy-  appearing elderly white male in no distress.  VITAL SIGNS:  Temperature 96.6, pulse is 57.  There is occasional  beating with Wenckebach.  Respiratory rate is 18; blood pressure is  140/86; saturating 100% on two liters.  HEENT:  Unremarkable.  NECK:  Carotids are without bruit.  No lymphadenopathy, thyromegaly, or  JVP elevation.  LUNGS:  Clear with good diaphragmatic motion, no wheezing.  S1-S2 normal  heart sounds.  PMI normal.  ABDOMEN:  Benign.  Bowel sounds positive.  No AAA.  No tenderness, no  hepatosplenomegaly.  No hepatojugular reflexes.  EXTREMITIES:  Pulses are intact.  No edema.  NEURO:  Nonfocal.  SKIN:  Warm and dry.  NEUROLOGIC:  No muscular weakness.  Status post left total knee  replacement.   EKG shows no acute changes other than conduction abnormalities with  group beating and Wenckebach.  Hematocrit is 43.7.  BUN 14, creatinine  0.94.  Point care enzymes negative.  Chest x-ray shows no active  disease.  No cardiomegaly.   IMPRESSION:  1. Chest pain associated with cardiac arrhythmias multiple coronary      risk factors including age.  Heart cath tomorrow.  Risks including      stroke were discussed and wanted to proceed.  Continue aspirin,      beta blocker, and start heparin.  2. Wenckebach rhythm may be associated with ischemia and no evidence      of syncope.  It was in the setting of pain.  We will  monitor on      telemetry, and hold beta-blocker.  No indication for pacer at this      time.  3. History of prostatism.  Good urinary flow.  Continue Avodart.  4. Hypercholesterolemia, possible coronary disease.  Continue Lipitor.      Do a lipid profile in the morning.  5  Hypertension currently well controlled.  Avoid AV nodal blocking  drugs.  Continue Benicar 50 a day, low-salt diet.   Further recommendations will be based on the results of his echo, which  he will have today; telemetry while in hospital; and heart cath in the  morning.      Noralyn Pick. Eden Emms, MD, [facility]  Electronically Signed     PCN/MEDQ  D:  12/09/2007  T:  12/09/2007  Job:  [id number]

## 2011-01-27 NOTE — Op Note (Signed)
NAME:  Sean Lane, Sean Lane NO.:  [id number]   MEDICAL RECORD NO.:  [id number]          PATIENT TYPE:  AMB   LOCATION:  [facility]                         FACILITY:  [facility]   PHYSICIAN:  Ollen Gross, M.D.    DATE OF BIRTH:  Aug 04, 1928   DATE OF PROCEDURE:  10/13/2005  DATE OF DISCHARGE:                                 OPERATIVE REPORT   PREOPERATIVE DIAGNOSIS:  Right knee medial meniscal tear.   POSTOPERATIVE DIAGNOSIS:  Right knee medial meniscal tear.   PROCEDURE:  Right knee arthroscopy with meniscal debridement.   SURGEON:  Ollen Gross, M.D.   ASSISTANT:  No assistant.   ANESTHESIA:  Local with MAC.   ESTIMATED BLOOD LOSS:  Minimal.   DRAINS:  None.   COMPLICATIONS:  None.   CONDITION:  Stable to recovery.   CLINICAL NOTE:  [name] is a 77 year old male with significant right  knee pain and mechanical symptoms. He has exam and history suggesting a  medial meniscal tear which was confirmed by MRI. He does have slight  degenerative change in the medial compartment but he is not bone on bone. He  has significant mechanical symptoms and presents now for arthroscopic  debridement.   PROCEDURE IN DETAIL:  After successful administration of local with MAC  anesthetic, a tourniquet was placed high on the right thigh and right lower  extremity prepped and draped in the usual sterile fashion. He did not have  his portals infiltrated with the knee block thus I used a total of 20 mL of  1% lidocaine to infiltrate the superomedial, inferomedial, and inferolateral  portal sites. Incisions are then made for the superomedial and inferolateral  portals and the inflow cannula passed superomedial and camera passed  inferolateral. Arthroscopic visualization proceeds. The undersurface of the  patella shows very low grade chondromalacia, no evidence of any chondral  defects or loose bodies. The trochlea looks essentially normal. There was  some synovitis in the  suprapatellar area but no loose bodies. The Medial and  lateral gutters are visualized, there are no loose bodies. Flexion and  valgus force was applied to the knee and the medial compartment was entered.  There was evidence of a significant tear body and posterior horn of the  medial meniscus. There was also some grade 2 and 3 chondromalacia on the  medial femoral condyle but no exposed bone and no unstable looking  cartilage. A spinal needle was used to localize the inferomedial portal, a  small incision made, dilator placed and then the meniscus was debrided back  to a stable base with baskets and a 4.2 mm shaver. It was then sealed off  with the ArthroCare to prevent any propagation of tears. I used the  ArthroCare to debride some of the hypertrophic synovium adjacent to the fat  pad and superior. At this point, we removed the  arthroscopic equipment from the inferior portals which were closed with  interrupted 4-0 nylon. 20 mL of 0.25% Marcaine with epi are injected through  the inflow cannula and then that is removed and that portal closed with  nylon. A bulky sterile  dressing applied and he is then awakened and  transported to recovery in stable condition.      Ollen Gross, M.D.  Electronically Signed     FA/MEDQ  D:  10/13/2005  T:  10/13/2005  Job:  [id number]

## 2011-02-14 NOTE — Progress Notes (Signed)
  Subjective:    Patient ID: Sean Lane, male    DOB: 08-16-1928, 77 y.o.   MRN: [id number]  HPI    Review of Systems     Objective:   Physical Exam        Assessment & Plan:

## 2011-03-28 ENCOUNTER — Ambulatory Visit (INDEPENDENT_AMBULATORY_CARE_PROVIDER_SITE_OTHER): Payer: Medicare Other | Admitting: Cardiovascular Disease

## 2011-03-28 ENCOUNTER — Encounter: Payer: Self-pay | Admitting: Cardiovascular Disease

## 2011-03-28 DIAGNOSIS — E785 Hyperlipidemia, unspecified: Secondary | ICD-10-CM

## 2011-03-28 DIAGNOSIS — I10 Essential (primary) hypertension: Secondary | ICD-10-CM

## 2011-03-28 DIAGNOSIS — I2510 Atherosclerotic heart disease of native coronary artery without angina pectoris: Secondary | ICD-10-CM

## 2011-03-28 MED ORDER — NITROGLYCERIN 0.4 MG SL SUBL: 0.4000 mg | SUBLINGUAL_TABLET | SUBLINGUAL | Status: DC | PRN

## 2011-03-28 NOTE — Assessment & Plan Note (Signed)
Well controlled.  Continue current medications and low sodium Dash type diet.    

## 2011-03-28 NOTE — Assessment & Plan Note (Signed)
Cholesterol is at goal.  Continue current dose of statin and diet Rx.  No myalgias or side effects.  F/U  LFT's in 6 months. Lab Results  Component Value Date   LDLCALC 74 01/17/2011             

## 2011-03-28 NOTE — Progress Notes (Signed)
Sean Lane is seen for F/U per Dr Lovell Sheehan He has had more fatigue. Dr Lovell Sheehan felt it may be due to bradycardia and PAC's. The clinical scenario is not consistant with this. The paitetn wakes up fatigued and sometime has to "drag himself out of bed". He actually feels better with exercise that he does 3x/week. There has been no syncope. He does get palpiations but no rapid sustained ones. I reviewed his event monitor and he had NSR with PAC's no long pauses and no prolonged SVT. He has chronic RBBB with no evidence of high grade heart block.  Gordy has known coronary artery disease. He had angioplasty and stent in the obtuse marginal branch in March 2009. Denied any significant recurrent chest pain. Slight being on Plavix for a year will continue it. He did get a drug-eluting stent. Cholesterol has been at target on statin. He has also had dizzyness in the past on higher dose Benicar.  Needs a refill on nitro  ROS: Denies fever, malais, weight loss, blurry vision, decreased visual acuity, cough, sputum, SOB, hemoptysis, pleuritic pain, palpitaitons, heartburn, abdominal pain, melena, lower extremity edema, claudication, or rash.  All other systems reviewed and negative  General: Affect appropriate Healthy:  appears stated age HEENT: normal Neck supple with no adenopathy JVP normal no bruits no thyromegaly Lungs clear with no wheezing and good diaphragmatic motion Heart:  S1/S2 no murmur,rub, gallop or click PMI normal Abdomen: benighn, BS positve, no tenderness, no AAA no bruit.  No HSM or HJR Distal pulses intact with no bruits No edema Neuro non-focal Skin warm and dry No muscular weakness   Current Outpatient Prescriptions  Medication Sig Dispense Refill  . aspirin 325 MG tablet Take 325 mg by mouth daily.        . citalopram (CELEXA) 10 MG tablet Take 10 mg by mouth as needed.       . clopidogrel (PLAVIX) 75 MG tablet Take 75 mg by mouth daily.        Marland Kitchen dutasteride (AVODART) 0.5 MG  capsule Take 0.5 mg by mouth. Qod alternating with finasteride       . fish oil-omega-3 fatty acids 1000 MG capsule Take 4 g by mouth daily.       . halobetasol (ULTRAVATE) 0.05 % cream Apply topically as needed.       Marland Kitchen LIPITOR 10 MG tablet TAKE ONE TABLET BY MOUTH DAILY.  90 each  3  . loratadine (CLARITIN) 10 MG tablet Take 10 mg by mouth daily.        . multivitamin (THERAGRAN) per tablet Take 1 tablet by mouth daily.        Marland Kitchen olmesartan (BENICAR) 20 MG tablet 1/2 tab po qd      . ranitidine (ZANTAC) 150 MG tablet Take 150 mg by mouth daily.        . SUMAtriptan (IMITREX) 25 MG tablet Take 25 mg by mouth every 2 (two) hours as needed.          Allergies  Hydromorphone hcl and Sulfonamide derivatives  Electrocardiogram:  Assessment and Plan

## 2011-03-28 NOTE — Assessment & Plan Note (Signed)
Stable with no angina and good activity level.  Continue medical Rx Refill for nitro called in 

## 2011-05-02 ENCOUNTER — Ambulatory Visit: Payer: Medicare Other | Admitting: Internal Medicine

## 2011-05-11 ENCOUNTER — Encounter: Payer: Self-pay | Admitting: Internal Medicine

## 2011-05-11 ENCOUNTER — Ambulatory Visit (INDEPENDENT_AMBULATORY_CARE_PROVIDER_SITE_OTHER): Payer: Medicare Other | Admitting: Internal Medicine

## 2011-05-11 DIAGNOSIS — I10 Essential (primary) hypertension: Secondary | ICD-10-CM

## 2011-05-11 DIAGNOSIS — R5383 Other fatigue: Secondary | ICD-10-CM

## 2011-05-11 DIAGNOSIS — R531 Weakness: Secondary | ICD-10-CM

## 2011-05-11 DIAGNOSIS — M1990 Unspecified osteoarthritis, unspecified site: Secondary | ICD-10-CM

## 2011-05-11 DIAGNOSIS — M19049 Primary osteoarthritis, unspecified hand: Secondary | ICD-10-CM

## 2011-05-11 DIAGNOSIS — J309 Allergic rhinitis, unspecified: Secondary | ICD-10-CM

## 2011-05-11 MED ORDER — RANITIDINE HCL 150 MG PO TABS: 150.0000 mg | ORAL_TABLET | Freq: Every day | ORAL | Status: DC

## 2011-05-11 MED ORDER — CLOPIDOGREL BISULFATE 75 MG PO TABS: 75.0000 mg | ORAL_TABLET | Freq: Every day | ORAL | Status: DC

## 2011-05-11 MED ORDER — DUTASTERIDE 0.5 MG PO CAPS: 0.5000 mg | ORAL_CAPSULE | Freq: Every day | ORAL | Status: DC

## 2011-05-11 NOTE — Progress Notes (Signed)
  Subjective:    Patient ID: [name], male    DOB: 09/26/1927, 77 y.o.   MRN: [id number]  HPI Patient is an 77 year old white male who is followed for hypertension hyperlipidemia and gastroesophageal reflux. He has complained recently of Myalgias and weakness No chest pain Nasal congestion without shortness of breath orthopnea PND nausea or exertional chest  Review of Systems  Constitutional: Negative for fever and fatigue.  HENT: Negative for hearing loss, congestion, neck pain and postnasal drip.   Eyes: Negative for discharge, redness and visual disturbance.  Respiratory: Negative for cough, shortness of breath and wheezing.   Cardiovascular: Negative for leg swelling.  Gastrointestinal: Negative for abdominal pain, constipation and abdominal distention.  Genitourinary: Negative for urgency and frequency.  Musculoskeletal: Negative for joint swelling and arthralgias.  Skin: Negative for color change and rash.  Neurological: Negative for weakness and light-headedness.  Hematological: Negative for adenopathy.  Psychiatric/Behavioral: Negative for behavioral problems.   Past Medical History  Diagnosis Date  . Coronary atherosclerosis of unspecified type of vessel, native or graft   . Hypertension   . Hyperlipidemia   . Ulcer   . Diverticulosis of colon (without mention of hemorrhage)   . GERD (gastroesophageal reflux disease)   . Dizziness and giddiness   . Conductive hearing loss, external ear   . Osteoarthrosis, unspecified whether generalized or localized, hand   . Family history of ischemic heart disease   . Personal history of urinary calculi   . Headache   . Osteoarthrosis, unspecified whether generalized or localized, unspecified site   . Allergy    Past Surgical History  Procedure Date  . Total knee arthroplasty   . Cataract extraction     reports that he quit smoking about 49 years ago. He does not have any smokeless tobacco history on file. He reports  that he does not drink alcohol or use illicit drugs. family history includes Heart disease in his father. Allergies  Allergen Reactions  . Hydromorphone Hcl     REACTION: reaction not listed  . Sulfonamide Derivatives     REACTION: reaction not given       Objective:   Physical Exam  Nursing note and vitals reviewed. Constitutional: He appears well-developed and well-nourished.  HENT:  Head: Normocephalic and atraumatic.  Eyes: Conjunctivae are normal. Pupils are equal, round, and reactive to light.  Neck: Normal range of motion. Neck supple.  Cardiovascular: Normal rate and regular rhythm.   Pulmonary/Chest: Effort normal and breath sounds normal.  Abdominal: Soft. Bowel sounds are normal.          Assessment & Plan:  Trial of astelin for allergic rhinitis.  Discussed the impact of lack of exercise on his endurance he will need screening for hypothyroidism but I do believe that much of what he is experiencing now is chronic deconditioning he goes to the gym and does some weight training but does not do any aerobic type exercise his wife has asked him to walk with her and he has not been walking with her we discussed the need to be mobile and to participate in aerobic activity of bed mobility is G2 survival at his age of 40 years.  He is in excellent overall physical condition and is progressive deconditioning his largest risk factor at this time a

## 2011-06-05 LAB — CARDIAC PANEL(CRET KIN+CKTOT+MB+TROPI)
Relative Index: INVALID
Total CK: 36
Troponin I: <0.01

## 2011-06-05 LAB — HEPARIN LEVEL (UNFRACTIONATED): Heparin Unfractionated: 0.73 — ABNORMAL HIGH

## 2011-06-05 LAB — COMPREHENSIVE METABOLIC PANEL
Alkaline Phosphatase: 70
BUN: 14
Calcium: 8.9
Glucose, Bld: 127 — ABNORMAL HIGH
Potassium: 3.6
Total Protein: 5.8 — ABNORMAL LOW

## 2011-06-05 LAB — DIFFERENTIAL
Basophils Relative: 1
Monocytes Relative: 7
Neutro Abs: 2.9
Neutrophils Relative %: 57

## 2011-06-05 LAB — URINALYSIS, ROUTINE W REFLEX MICROSCOPIC
Bilirubin Urine: NEGATIVE
Glucose, UA: NEGATIVE
Hgb urine dipstick: NEGATIVE
Ketones, ur: NEGATIVE
pH: 6

## 2011-06-05 LAB — CBC
HCT: 43.7
Hemoglobin: 15.4
MCHC: 35.1
MCHC: 35.2
MCV: 93
Platelets: 128 — ABNORMAL LOW
RDW: 13.3
WBC: 5.9

## 2011-06-05 LAB — TROPONIN I
Troponin I: 0.01
Troponin I: 0.04

## 2011-06-05 LAB — POCT CARDIAC MARKERS
CKMB, poc: <1 — ABNORMAL LOW
CKMB, poc: <1 — ABNORMAL LOW
CKMB, poc: <1 — ABNORMAL LOW
Myoglobin, poc: 46
Myoglobin, poc: 51
Myoglobin, poc: 55.5
Operator id: 114141
Operator id: 285841
Operator id: 294501
Troponin i, poc: <0.05
Troponin i, poc: <0.05
Troponin i, poc: <0.05

## 2011-06-05 LAB — HEMOGLOBIN A1C
Hgb A1c MFr Bld: 6
Mean Plasma Glucose: 136

## 2011-06-05 LAB — CK TOTAL AND CKMB (NOT AT ARMC)
CK, MB: 1.3
CK, MB: 1.3
Relative Index: INVALID
Relative Index: INVALID
Total CK: 32
Total CK: 35

## 2011-06-05 LAB — LIPID PANEL: Cholesterol: 131

## 2011-06-05 LAB — TSH: TSH: 0.788

## 2011-06-05 LAB — PROTIME-INR: INR: 1

## 2011-06-05 LAB — APTT: aPTT: 33

## 2011-06-06 LAB — COMPREHENSIVE METABOLIC PANEL
AST: 34
Albumin: 4.7
Calcium: 9.5
Chloride: 104
Creatinine, Ser: 1.1
GFR calc Af Amer: >60
Sodium: 134 — ABNORMAL LOW
Total Bilirubin: 1.1

## 2011-06-06 LAB — CBC
HCT: 46.8
Hemoglobin: 16.2
MCV: 92.5
Platelets: 132 — ABNORMAL LOW
Platelets: 177
RBC: 4.59
RBC: 5.01
WBC: 5.9
WBC: 6.4

## 2011-06-06 LAB — BASIC METABOLIC PANEL
Calcium: 9
Creatinine, Ser: 0.94
GFR calc Af Amer: >60
GFR calc non Af Amer: >60
Sodium: 139

## 2011-06-06 LAB — POCT CARDIAC MARKERS
CKMB, poc: <1 — ABNORMAL LOW
Troponin i, poc: <0.05

## 2011-06-06 LAB — HEPARIN LEVEL (UNFRACTIONATED): Heparin Unfractionated: <0.1 — ABNORMAL LOW

## 2011-06-06 LAB — URINE MICROSCOPIC-ADD ON

## 2011-06-06 LAB — POCT I-STAT, CHEM 8
Chloride: 112
HCT: 46
Potassium: 4.4

## 2011-06-06 LAB — URINALYSIS, ROUTINE W REFLEX MICROSCOPIC
Bilirubin Urine: NEGATIVE
Glucose, UA: NEGATIVE
Nitrite: NEGATIVE
Specific Gravity, Urine: 1.006
pH: 7

## 2011-07-18 ENCOUNTER — Other Ambulatory Visit: Payer: Self-pay | Admitting: *Deleted

## 2011-07-18 MED ORDER — CLOPIDOGREL BISULFATE 75 MG PO TABS: 75.0000 mg | ORAL_TABLET | Freq: Every day | ORAL | Status: DC

## 2011-09-08 ENCOUNTER — Ambulatory Visit (INDEPENDENT_AMBULATORY_CARE_PROVIDER_SITE_OTHER): Payer: Medicare Other | Admitting: Internal Medicine

## 2011-09-08 ENCOUNTER — Encounter: Payer: Self-pay | Admitting: Internal Medicine

## 2011-09-08 ENCOUNTER — Ambulatory Visit (INDEPENDENT_AMBULATORY_CARE_PROVIDER_SITE_OTHER)
Admission: RE | Admit: 2011-09-08 | Discharge: 2011-09-08 | Disposition: A | Discharge status: Home / Self Care | Payer: Medicare Other | Source: Ambulatory Visit | Attending: Internal Medicine | Admitting: Internal Medicine

## 2011-09-08 VITALS — BP 130/80 | HR 64 | Temp 98.3°F | Resp 16 | Ht 73.0 in | Wt 221.0 lb

## 2011-09-08 DIAGNOSIS — M5412 Radiculopathy, cervical region: Secondary | ICD-10-CM

## 2011-09-08 DIAGNOSIS — I10 Essential (primary) hypertension: Secondary | ICD-10-CM

## 2011-09-08 DIAGNOSIS — IMO0002 Reserved for concepts with insufficient information to code with codable children: Secondary | ICD-10-CM

## 2011-09-08 DIAGNOSIS — K219 Gastro-esophageal reflux disease without esophagitis: Secondary | ICD-10-CM

## 2011-09-08 IMAGING — CR DG CERVICAL SPINE WITH FLEX & EXTEND
7 series · 7 of 7 positions shown · non-contrast
Comparison: None.

CLINICAL DATA: 83-year-old male with neck pain radiating to the
shoulder.  Numbness in the left upper extremity.  No recent injury.

CERVICAL SPINE COMPLETE WITH FLEXION AND EXTENSION VIEWS

[view not recorded (1 of 7)]
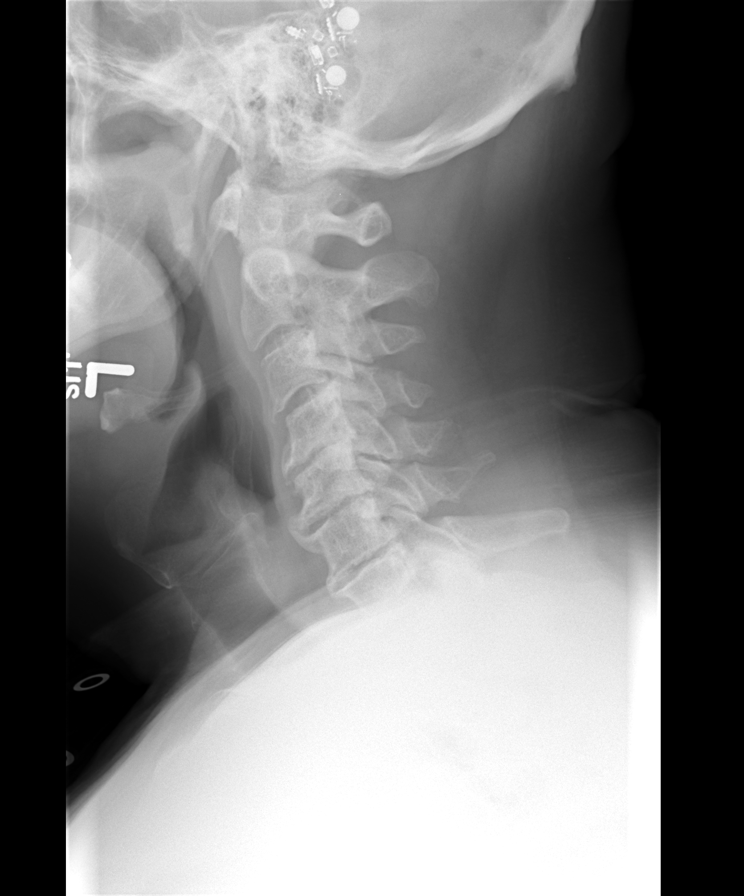

[view not recorded (2 of 7)]
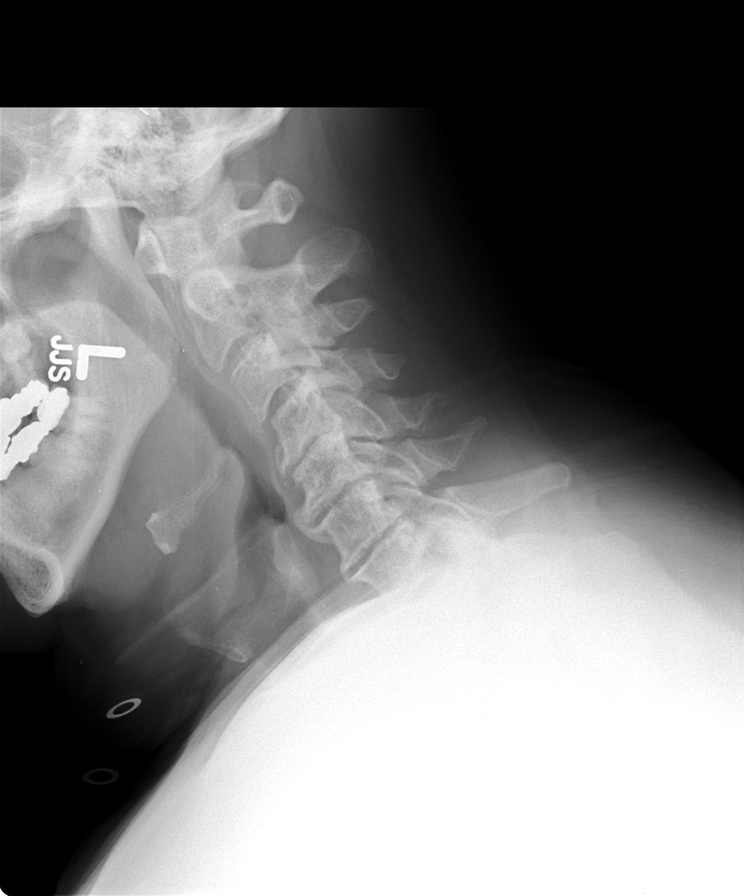

[view not recorded (3 of 7)]
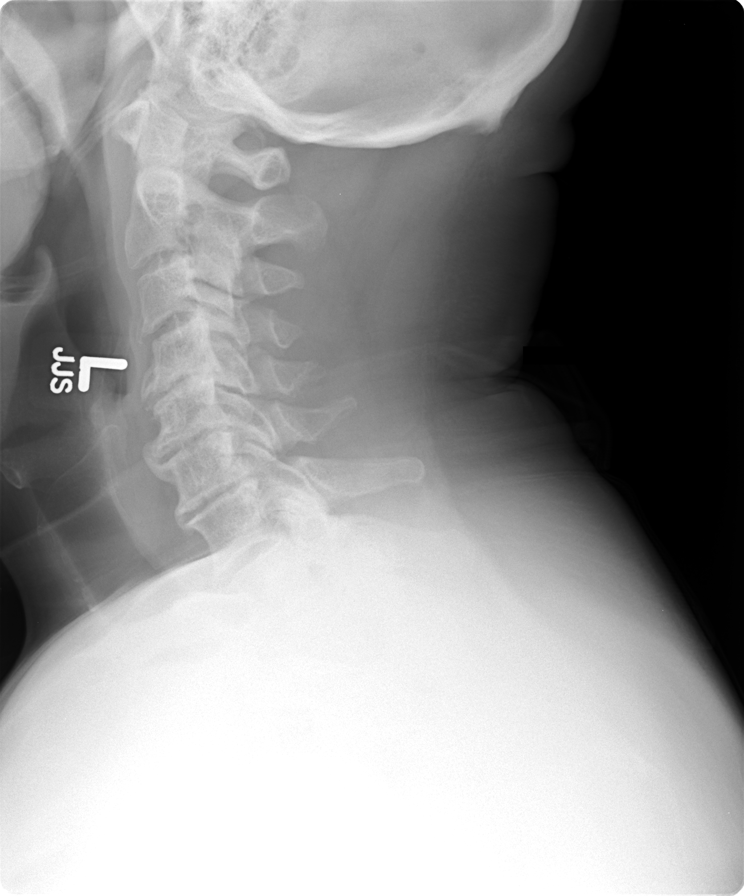

[view not recorded (4 of 7)]
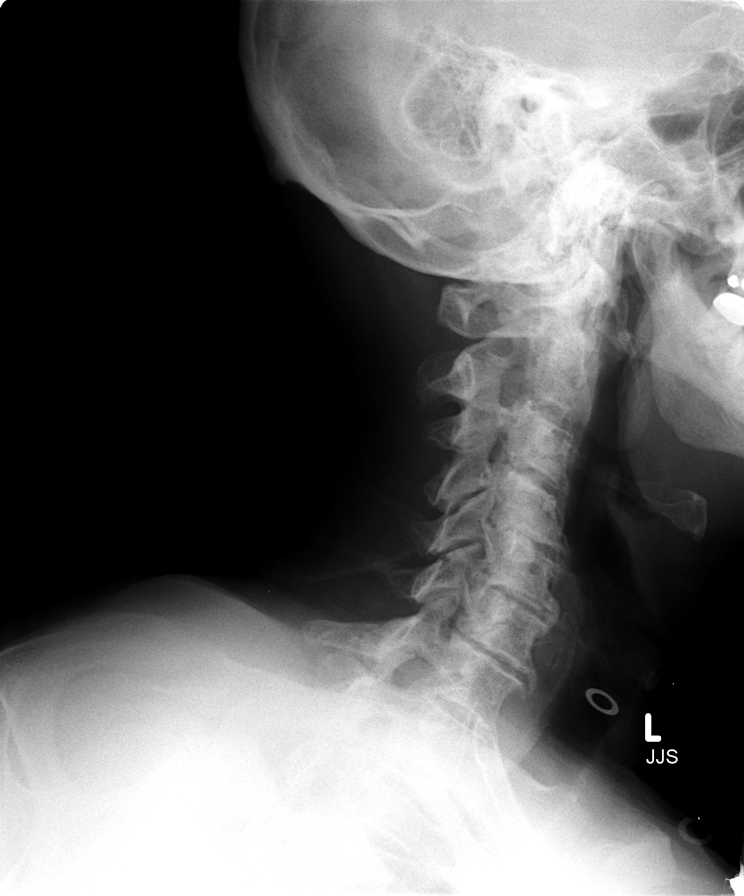

[view not recorded (5 of 7)]
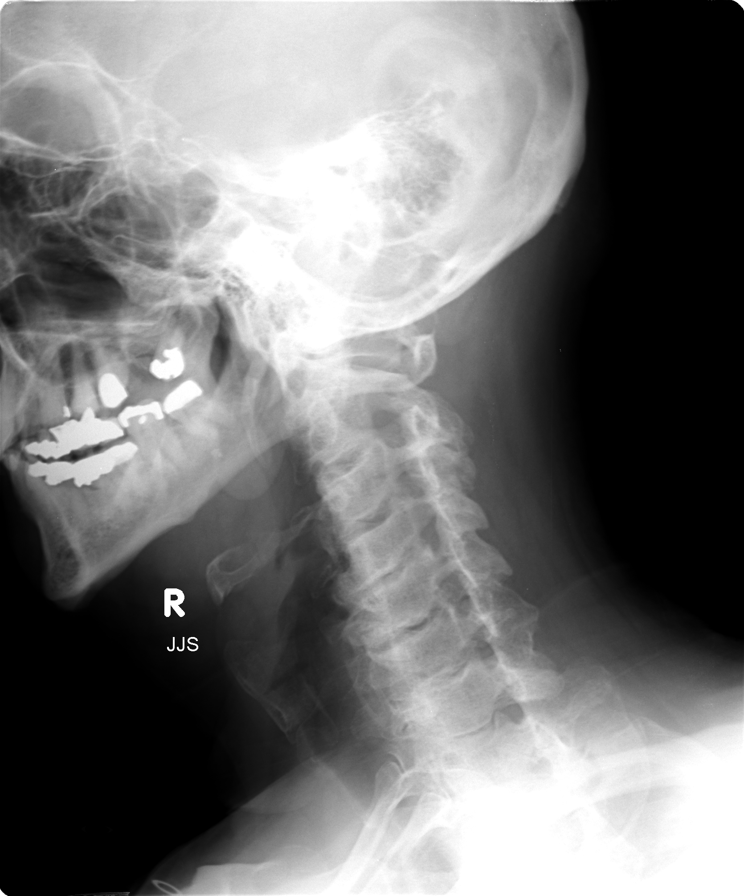

[view not recorded (6 of 7)]
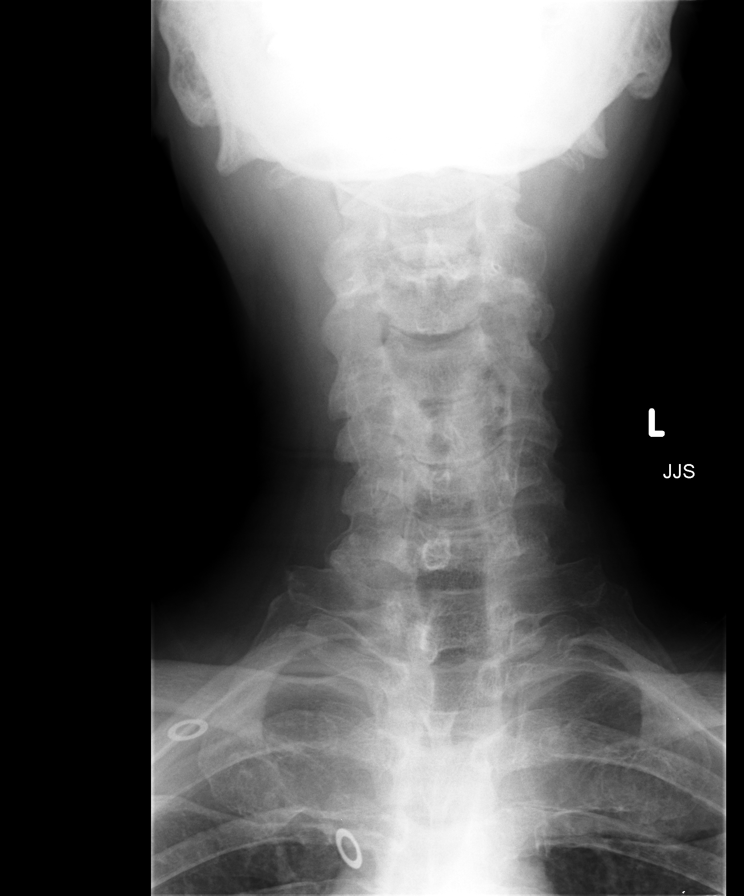

[view not recorded (7 of 7)]
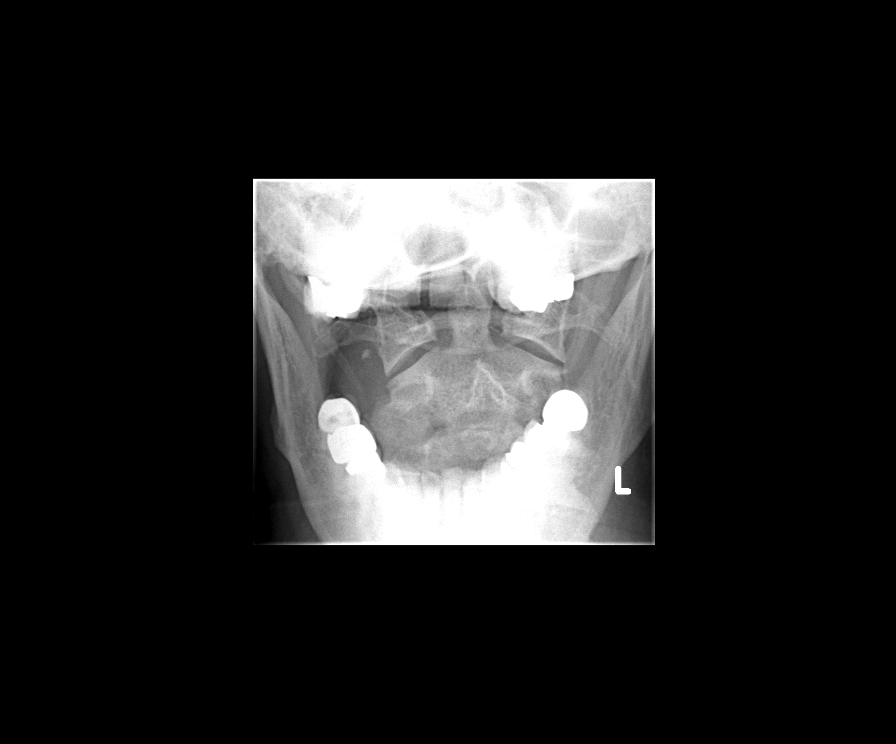

[7 of 7 positions shown; findings below may reference images not displayed]

FINDINGS: Straightening of cervical lordosis.  Normal prevertebral
soft tissues.  Widespread cervical disc space loss, severe C4-C5
through C6-C7.  Bulky endplate osteophytes, maximal at C5-C6.

Lateral views in flexion and extension demonstrate very little
range of motion.  No abnormal motion. Cervicothoracic junction
alignment is within normal limits.

Bilateral posterior element alignment is within normal limits.
Multilevel cervical facet hypertrophy.  Positioning is suboptimal
for the evaluation of the neural foramen.  AP alignment within
normal limits.  Lung apices within normal limits.  C1-C2 alignment
and odontoid within normal limits.
IMPRESSION: Advanced C4-C5 through C6-C7 disc degeneration with bulky endplate
spurring.  Very little range of motion demonstrated on flexion and
extension, no abnormal motion.

## 2011-09-08 MED ORDER — CLOPIDOGREL BISULFATE 75 MG PO TABS: 75.0000 mg | ORAL_TABLET | Freq: Every day | ORAL | Status: DC

## 2011-09-08 MED ORDER — HALOBETASOL PROPIONATE 0.05 % EX CREA: TOPICAL_CREAM | Freq: Two times a day (BID) | CUTANEOUS | Status: DC

## 2011-09-08 MED ORDER — OLMESARTAN MEDOXOMIL 20 MG PO TABS: 20.0000 mg | ORAL_TABLET | Freq: Every day | ORAL | Status: DC

## 2011-09-08 MED ORDER — DUTASTERIDE 0.5 MG PO CAPS: 0.5000 mg | ORAL_CAPSULE | Freq: Every day | ORAL | Status: DC

## 2011-09-08 MED ORDER — LORATADINE 10 MG PO TABS: 10.0000 mg | ORAL_TABLET | Freq: Every day | ORAL | Status: DC

## 2011-09-08 MED ORDER — SUMATRIPTAN SUCCINATE 25 MG PO TABS: 25.0000 mg | ORAL_TABLET | ORAL | Status: DC | PRN

## 2011-09-08 MED ORDER — RANITIDINE HCL 150 MG PO TABS: 150.0000 mg | ORAL_TABLET | Freq: Every day | ORAL | Status: DC

## 2011-09-08 MED ORDER — CYCLOBENZAPRINE HCL 5 MG PO TABS: 5.0000 mg | ORAL_TABLET | Freq: Three times a day (TID) | ORAL | Status: DC | PRN

## 2011-09-08 MED ORDER — METHYLPREDNISOLONE 4 MG PO KIT: PACK | ORAL | Status: AC

## 2011-09-08 MED ORDER — LIPITOR 10 MG PO TABS: 10.0000 mg | ORAL_TABLET | Freq: Every day | ORAL | Status: DC

## 2011-09-08 MED ORDER — CITALOPRAM HYDROBROMIDE 10 MG PO TABS: 10.0000 mg | ORAL_TABLET | ORAL | Status: DC | PRN

## 2011-09-08 NOTE — Patient Instructions (Signed)
Prescriptions have been written for mail order
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
## 2011-09-20 ENCOUNTER — Other Ambulatory Visit: Payer: Self-pay | Admitting: *Deleted

## 2011-10-02 ENCOUNTER — Telehealth: Payer: Self-pay | Admitting: Cardiovascular Disease

## 2011-10-02 NOTE — Telephone Encounter (Signed)
New Problem   Patient wife [name] called said patient experiencing changes in blood pressure and light headed spells. Please return call to hm#

## 2011-10-02 NOTE — Telephone Encounter (Signed)
SPOKE WITH WIFE IN GREAT LENGTH. PT  C/O  LOW  B/P  AND  HR  ALL OVER  THE  PLACE FOR THE LAST WEEK   B/P RANGING 86/53 - 115-120/ 60  AND HR  55-143  ALSO NOTED SOME  SOB  YESTERDAY WHILE [facility]  WALKING  TO BUILDING AND  CLIMBING STAIRS   AND TODAY  . WIFE STATE DR Eden Emms MENTIONED MAY NEED PACEMAKER IN FUTURE  APPT  MADE WITH [facility] FOR TOM AT 9:00 AM  10-03-11 . PT'S WIFE AWARE WILL FORWARD MESSAGE TO DR Eden Emms FOR REVIEW .Zack Seal

## 2011-10-03 ENCOUNTER — Telehealth: Payer: Self-pay | Admitting: *Deleted

## 2011-10-03 ENCOUNTER — Encounter: Payer: Self-pay | Admitting: Physician Assistant

## 2011-10-03 ENCOUNTER — Encounter (INDEPENDENT_AMBULATORY_CARE_PROVIDER_SITE_OTHER): Payer: Medicare Other

## 2011-10-03 ENCOUNTER — Other Ambulatory Visit (HOSPITAL_COMMUNITY): Payer: Medicare Other

## 2011-10-03 ENCOUNTER — Other Ambulatory Visit: Payer: Self-pay | Admitting: Internal Medicine

## 2011-10-03 ENCOUNTER — Ambulatory Visit (INDEPENDENT_AMBULATORY_CARE_PROVIDER_SITE_OTHER): Payer: Medicare Other | Admitting: Physician Assistant

## 2011-10-03 DIAGNOSIS — I4892 Unspecified atrial flutter: Secondary | ICD-10-CM

## 2011-10-03 DIAGNOSIS — I2510 Atherosclerotic heart disease of native coronary artery without angina pectoris: Secondary | ICD-10-CM | POA: Diagnosis not present

## 2011-10-03 DIAGNOSIS — I10 Essential (primary) hypertension: Secondary | ICD-10-CM | POA: Diagnosis not present

## 2011-10-03 LAB — BASIC METABOLIC PANEL
BUN: 23 mg/dL (ref 6–23)
Calcium: 9.7 mg/dL (ref 8.4–10.5)
Creatinine, Ser: 1.2 mg/dL (ref 0.4–1.5)
GFR: 63.55 mL/min (ref >60.00)
Glucose, Bld: 146 mg/dL — ABNORMAL HIGH (ref 70–99)
Potassium: 4.3 mEq/L (ref 3.5–5.1)

## 2011-10-03 LAB — TSH: TSH: 1.23 u[IU]/mL (ref 0.35–5.50)

## 2011-10-03 MED ORDER — WARFARIN SODIUM 5 MG PO TABS: 5.0000 mg | ORAL_TABLET | Freq: Every day | ORAL | Status: DC

## 2011-10-03 MED ORDER — OLMESARTAN MEDOXOMIL 5 MG PO TABS: 5.0000 mg | ORAL_TABLET | Freq: Every day | ORAL | Status: DC

## 2011-10-03 NOTE — Telephone Encounter (Signed)
I confirmed coumadin directions are 5mg  daily M-F and 1/2 tablet (2.5 mg) on Sat and Sun.

## 2011-10-03 NOTE — Progress Notes (Addendum)
[address] [place], [place]  [address] Phone: [phone] Fax:  [phone]  Date:  10/03/2011   Name:  Sean Lane       DOB:  May 15, 1928 MRN:  [id number]  PCP:  Dr. Lovell Sheehan Primary Cardiologist:  Dr. Charlton Haws  Primary Electrophysiologist:  None    History of Present Illness: Sean Lane is a 78 y.o. male who presents for fluctuations in heart rate and BP.   He has a history of CAD, s/p PCI to CFX in 2009 with Cypher DES x2, hypertension, hyperlipidemia, right bundle branch block, GERD, BPH.  LHC 3/09: Mid RCA 50%, left main 20%, proximal LAD 40-50%, mid LAD 40%, proximal circumflex 80% and an 80% after OM2, left PDA 40%, EF 60%.  PCI: Cypher DES x2 to the proximal and mid circumflex.  Monitor in 2011 demonstrated NSR, PACs, right bundle branch block, no long pauses, no SVT, no high-grade heart block.  Last exercise treadmill test 1/12: Heart rate increased appropriately, no ischemic EKG changes.  Last seen by Dr. Eden Emms 7/12.  Over the last one to 2 weeks, he has noted fluctuations in his heart rate and blood pressure.  His blood pressure was as low as 58 systolically recently and he felt lightheaded and diaphoretic.  He denies syncope or near-syncope.  He does note fatigue.  He was at the gym recently and his heart rate was up in the 140s.  He denies chest discomfort.  He does note dyspnea with exertion.  He describes class II-to be symptoms.  This is fairly chronic without significant change.  He denies orthopnea, PND or edema.  Past Medical History  Diagnosis Date  . Coronary atherosclerosis of unspecified type of vessel, native or graft   . Hypertension   . Hyperlipidemia   . Ulcer   . Diverticulosis of colon (without mention of hemorrhage)   . GERD (gastroesophageal reflux disease)   . Dizziness and giddiness   . Conductive hearing loss, external ear   . Osteoarthrosis, unspecified whether generalized or localized, hand   .  Family history of ischemic heart disease   . Personal history of urinary calculi   . Headache   . Osteoarthrosis, unspecified whether generalized or localized, unspecified site   . Allergy     Current Outpatient Prescriptions  Medication Sig Dispense Refill  . aspirin 325 MG tablet Take 325 mg by mouth daily.        . citalopram (CELEXA) 10 MG tablet Take 1 tablet (10 mg total) by mouth as needed.  90 tablet  3  . clopidogrel (PLAVIX) 75 MG tablet Take 1 tablet (75 mg total) by mouth daily.  90 tablet  3  . cyclobenzaprine (FLEXERIL) 5 MG tablet Take 1 tablet (5 mg total) by mouth every 8 (eight) hours as needed for muscle spasms.  30 tablet  1  . dutasteride (AVODART) 0.5 MG capsule Take 1 capsule (0.5 mg total) by mouth daily.  90 capsule  3  . fish oil-omega-3 fatty acids 1000 MG capsule Take 4 g by mouth daily.       . halobetasol (ULTRAVATE) 0.05 % cream Apply topically 2 (two) times daily.  50 g  3  . LIPITOR 10 MG tablet Take 1 tablet (10 mg total) by mouth daily.  90 each  3  . loratadine (CLARITIN) 10 MG tablet Take 1 tablet (10 mg total) by mouth daily.  90 tablet  3  . multivitamin (THERAGRAN) per tablet  Take 1 tablet by mouth daily.        . nitroGLYCERIN (NITROSTAT) 0.4 MG SL tablet Place 1 tablet (0.4 mg total) under the tongue every 5 (five) minutes as needed for chest pain.  25 tablet  3  . olmesartan (BENICAR) 20 MG tablet Take 1 tablet (20 mg total) by mouth daily. 1/2 tab po qd  90 tablet  3  . ranitidine (ZANTAC) 150 MG tablet Take 1 tablet (150 mg total) by mouth daily.  90 tablet  3  . SUMAtriptan (IMITREX) 25 MG tablet Take 1 tablet (25 mg total) by mouth every 2 (two) hours as needed.  30 tablet  3    Allergies: Allergies  Allergen Reactions  . Hydromorphone Hcl     REACTION: reaction not listed  . Sulfonamide Derivatives     REACTION: reaction not given    History  Substance Use Topics  . Smoking status: Former Smoker    Quit date: 09/11/1961  .  Smokeless tobacco: Not on file  . Alcohol Use: No     ROS:  Please see the history of present illness.   He denies fevers, chills, melena, hematochezia, nausea, vomiting, diarrhea.  He does note postnasal drip.  He lost 9 pounds recently by dieting.  All other systems reviewed and negative.   PHYSICAL EXAM: VS:  BP 152/80  Pulse 110  Resp 18  Ht 6\' 1"  (1.854 m)  Wt 212 lb 6.4 oz (96.344 kg)  BMI 28.02 kg/m2 Repeat blood pressure by me: 102/78  Well nourished, well developed, in no acute distress HEENT: normal Neck: no JVD Endocrine: no thyromegaly Cardiac:  normal S1, S2; RRR; no murmur Lungs:  clear to auscultation bilaterally, no wheezing, rhonchi or rales Abd: soft, nontender, no hepatomegaly Ext: no edema Skin: warm and dry Neuro:  CNs 2-12 intact, no focal abnormalities noted Psych: Normal affect  EKG:   Atrial flutter, variable AV block, right bundle branch block, heart rate approximately 60  ASSESSMENT AND PLAN:

## 2011-10-03 NOTE — Assessment & Plan Note (Signed)
No angina.  His aspirin will be reduced to 81 mg a day.  Hold Plavix as noted with the initiation of Coumadin.

## 2011-10-03 NOTE — Telephone Encounter (Signed)
Pt faxed [place] that his blood pressure was low at 95/56 and was fatigued when walking up stairs and become lightheaded for a few seconds- pt states this has been going on for a few weeks- per dr Lovell Sheehan- he needs echo and then to see dr Ann Held informed and orders sent to [facility]

## 2011-10-03 NOTE — Patient Instructions (Addendum)
You have been referred to SEE ONE OF OUR ELECTROPHYSIOLOGIST EITHER DR. Ladona Ridgel, DR. ALLRED, OR DR. KLEIN  You have been referred to [facility] FOR NEW PT IN 1 WEEK, PT JUST PUT ON COUMADIN TODAY,  Your physician recommends that you return for lab work in: TODAY TSH, BMET, PT/INR  Your physician has requested that you have an echocardiogram DX A-FLUTTER. Echocardiography is a painless test that uses sound waves to create images of your heart. It provides your doctor with information about the size and shape of your heart and how well your heart's chambers and valves are working. This procedure takes approximately one hour. There are no restrictions for this procedure.  Your physician has recommended that you wear an event monitor DX A-FLUTTER. Event monitors are medical devices that record the heart's electrical activity. Doctors most often [place] these monitors to diagnose arrhythmias. Arrhythmias are problems with the speed or rhythm of the heartbeat. The monitor is a small, portable device. You can wear one while you do your normal daily activities. This is usually used to diagnose what is causing palpitations/syncope (passing out).  Your physician has recommended you make the following change in your medication: STOP TAKING PLAVIX; START COUMADIN 5 MG TABLET TAKE 1 WHOLE TABLET ON M-F AND TAKE 1/2 TABLET ON SAT AND SUN; STAY ON ASPIRIN 325 MG UNTIL YOUR INR WITH THE COUMADIN CLINIC IS 2.0 OR GREATER, ONCE 2.0 OR HIGHER YOU CAN DECREASE YOUR ASPIRIN TO 81 MG DAILY; DECREASE BENICAR TO 5 MG DAILY.

## 2011-10-03 NOTE — Telephone Encounter (Signed)
New Msg: Pt wife calling wanting to clarify dosing directions for pt coumadin. Please return pt wife call to discuss further.

## 2011-10-03 NOTE — Assessment & Plan Note (Addendum)
Blood pressure somewhat low.  Decrease Benicar 5 mg daily.

## 2011-10-03 NOTE — Assessment & Plan Note (Addendum)
This is a new problem.  Duration unknown.  Thromboembolic risk factors include age and hypertension and vascular disease.  I reviewed his ECG with Dr. Graciela Husbands.  This is a typical flutter and is potentially ablatable.  I reviewed his case with Dr. Eden Emms.  We will stop his Plavix.  We will put him on Coumadin.  I will start at 5 mg daily and 2.5 mg on Saturdays and Sundays.  He will have an INR checked today.  We will establish him with the Coumadin clinic and he will followup in one week.  I will also check a basic metabolic panel and TSH today.  He will be set up for an echocardiogram.  He's had some fluctuations in his heart rate and blood pressure.  After further review with Dr. Eden Emms, we will place him on an event monitor to ensure that he does not have any significant pauses or bradycardia arrhythmias.  He will be set up with electrophysiology in the next 3-4 weeks.

## 2011-10-04 ENCOUNTER — Telehealth: Payer: Self-pay | Admitting: Cardiovascular Disease

## 2011-10-04 ENCOUNTER — Encounter: Payer: Self-pay | Admitting: Internal Medicine

## 2011-10-04 ENCOUNTER — Ambulatory Visit (INDEPENDENT_AMBULATORY_CARE_PROVIDER_SITE_OTHER): Payer: Medicare Other | Admitting: Internal Medicine

## 2011-10-04 DIAGNOSIS — I10 Essential (primary) hypertension: Secondary | ICD-10-CM | POA: Diagnosis not present

## 2011-10-04 DIAGNOSIS — I4892 Unspecified atrial flutter: Secondary | ICD-10-CM

## 2011-10-04 NOTE — Assessment & Plan Note (Signed)
I have discussed the treatment options. He has symptomatic atrial flutter and is a good candidate for catheter ablation. The risks/benefits/goals/expectations of catheter ablation have been discussed with the patient and he wishes to proceed. He will need 3 weeks of therapeutic anti-coagulation prior to ablation.

## 2011-10-04 NOTE — Telephone Encounter (Signed)
SPOKE WITH PT'S WIFE  APPT  IS WITH DR Ladona Ridgel NOT DR CRENSHAW INFORMED THAT DR Ladona Ridgel IS MD FOR [facility]  ISSUES APPT IS FOR TODAY ALSO HAD CONCERNS WOULD NOT SEE DR Eden Emms ANYMORE INFORMED WIFE  PT WOULD SEE BOTH IF NECESSARY. ALSO HAD QUESTION RE EXERCISE  INSTRUCTED FOR PT TO ASK  DR Ladona Ridgel  THIS AFTERNOON .Zack Seal

## 2011-10-04 NOTE — Progress Notes (Signed)
HPI [name] is referred by [name] for evaluation of atrial flutter. The patient denies chest pain but has noted for the past few weeks that he has had increased dyspnea with exertion, as well as fatigue. He was started on Coumadin one day ago. He has not had syncope. He denies peripheral edema. No symptoms of hyperthyroid. He has a h/o CAD, and is s/p stenting approximately 5 yrs ago. Allergies  Allergen Reactions  . Hydromorphone Hcl     REACTION: reaction not listed  . Sulfonamide Derivatives     REACTION: reaction not given     Current Outpatient Prescriptions  Medication Sig Dispense Refill  . aspirin EC 81 MG tablet Take 1 tablet (81 mg total) by mouth daily.      . citalopram (CELEXA) 10 MG tablet Take 1 tablet (10 mg total) by mouth as needed.  90 tablet  3  . cyclobenzaprine (FLEXERIL) 5 MG tablet Take 1 tablet (5 mg total) by mouth every 8 (eight) hours as needed for muscle spasms.  30 tablet  1  . dutasteride (AVODART) 0.5 MG capsule Take 1 capsule (0.5 mg total) by mouth daily.  90 capsule  3  . fish oil-omega-3 fatty acids 1000 MG capsule Take 4 g by mouth daily.       . halobetasol (ULTRAVATE) 0.05 % cream Apply topically 2 (two) times daily.  50 g  3  . LIPITOR 10 MG tablet Take 1 tablet (10 mg total) by mouth daily.  90 each  3  . loratadine (CLARITIN) 10 MG tablet Take 1 tablet (10 mg total) by mouth daily.  90 tablet  3  . multivitamin (THERAGRAN) per tablet Take 1 tablet by mouth daily.        . nitroGLYCERIN (NITROSTAT) 0.4 MG SL tablet Place 1 tablet (0.4 mg total) under the tongue every 5 (five) minutes as needed for chest pain.  25 tablet  3  . olmesartan (BENICAR) 5 MG tablet Take 1 tablet (5 mg total) by mouth daily.  30 tablet  11  . ranitidine (ZANTAC) 150 MG tablet Take 1 tablet (150 mg total) by mouth daily.  90 tablet  3  . SUMAtriptan (IMITREX) 25 MG tablet Take 1 tablet (25 mg total) by mouth every 2 (two) hours as needed.  30 tablet  3  .  warfarin (COUMADIN) 5 MG tablet Take 1 tablet (5 mg total) by mouth daily.  30 tablet  11     Past Medical History  Diagnosis Date  . Coronary atherosclerosis of unspecified type of vessel, native or graft   . Hypertension   . Hyperlipidemia   . Ulcer   . Diverticulosis of colon (without mention of hemorrhage)   . GERD (gastroesophageal reflux disease)   . Dizziness and giddiness   . Conductive hearing loss, external ear   . Osteoarthrosis, unspecified whether generalized or localized, hand   . Family history of ischemic heart disease   . Personal history of urinary calculi   . Headache   . Osteoarthrosis, unspecified whether generalized or localized, unspecified site   . Allergy     ROS:   All systems reviewed and negative except as noted in the HPI.   Past Surgical History  Procedure Date  . Total knee arthroplasty   . Cataract extraction      Family History  Problem Relation Age of Onset  . Heart disease Father      History   Social History  .  Marital Status: Married    Spouse Name: N/A    Number of Children: N/A  . Years of Education: N/A   Occupational History  . Not on file.   Social History Main Topics  . Smoking status: Former Smoker    Quit date: 09/11/1961  . Smokeless tobacco: Not on file  . Alcohol Use: No  . Drug Use: No  . Sexually Active: Yes   Other Topics Concern  . Not on file   Social History Narrative  . No narrative on file     BP 106/67  Pulse 74  Ht 6\' 1"  (1.854 m)  Wt 96.163 kg (212 lb)  BMI 27.97 kg/m2  Physical Exam:  Well appearing man who looks younger than his stated age and in NAD HEENT: Unremarkable Neck:  No JVD, no thyromegally Lungs:  Clear with no wheezes, rales, or rhonchi.  HEART:  IRegular tachycardic rhythm, no murmurs, no rubs, no clicks Abd:  soft, positive bowel sounds, no organomegally, no rebound, no guarding Ext:  2 plus pulses, no edema, no cyanosis, no clubbing Skin:  No rashes no  nodules Neuro:  CN II through XII intact, motor grossly intact  EKG Atrial flutter with a variable ventricular response  Assess/Plan:

## 2011-10-04 NOTE — Assessment & Plan Note (Signed)
His blood pressure is well controlled. He will continue a low-sodium diet. 

## 2011-10-04 NOTE — Telephone Encounter (Signed)
Pt's wife calling re a referral to dr Jens Som, doesn't know why, has questions re excercise

## 2011-10-10 ENCOUNTER — Ambulatory Visit (INDEPENDENT_AMBULATORY_CARE_PROVIDER_SITE_OTHER): Payer: Medicare Other

## 2011-10-10 ENCOUNTER — Ambulatory Visit: Payer: Medicare Other | Admitting: *Deleted

## 2011-10-10 ENCOUNTER — Ambulatory Visit (HOSPITAL_COMMUNITY): Payer: Medicare Other | Attending: Cardiology

## 2011-10-10 DIAGNOSIS — R5381 Other malaise: Secondary | ICD-10-CM | POA: Diagnosis not present

## 2011-10-10 DIAGNOSIS — I2510 Atherosclerotic heart disease of native coronary artery without angina pectoris: Secondary | ICD-10-CM | POA: Insufficient documentation

## 2011-10-10 DIAGNOSIS — E785 Hyperlipidemia, unspecified: Secondary | ICD-10-CM | POA: Diagnosis not present

## 2011-10-10 DIAGNOSIS — I10 Essential (primary) hypertension: Secondary | ICD-10-CM | POA: Insufficient documentation

## 2011-10-10 DIAGNOSIS — R0609 Other forms of dyspnea: Secondary | ICD-10-CM | POA: Diagnosis not present

## 2011-10-10 DIAGNOSIS — I4892 Unspecified atrial flutter: Secondary | ICD-10-CM

## 2011-10-10 DIAGNOSIS — R0989 Other specified symptoms and signs involving the circulatory and respiratory systems: Secondary | ICD-10-CM | POA: Insufficient documentation

## 2011-10-10 DIAGNOSIS — Z7901 Long term (current) use of anticoagulants: Secondary | ICD-10-CM | POA: Diagnosis not present

## 2011-10-10 DIAGNOSIS — I4510 Unspecified right bundle-branch block: Secondary | ICD-10-CM | POA: Insufficient documentation

## 2011-10-10 DIAGNOSIS — R5383 Other fatigue: Secondary | ICD-10-CM | POA: Diagnosis not present

## 2011-10-10 NOTE — Patient Instructions (Signed)

## 2011-10-11 ENCOUNTER — Telehealth: Payer: Self-pay | Admitting: Cardiology

## 2011-10-11 NOTE — Telephone Encounter (Signed)
Fu call °Patient returning your call about test results °

## 2011-10-17 ENCOUNTER — Ambulatory Visit (INDEPENDENT_AMBULATORY_CARE_PROVIDER_SITE_OTHER): Payer: Medicare Other | Admitting: Pharmacist

## 2011-10-17 DIAGNOSIS — I4892 Unspecified atrial flutter: Secondary | ICD-10-CM

## 2011-10-17 DIAGNOSIS — Z7901 Long term (current) use of anticoagulants: Secondary | ICD-10-CM | POA: Diagnosis not present

## 2011-10-19 ENCOUNTER — Other Ambulatory Visit: Payer: Self-pay

## 2011-10-19 DIAGNOSIS — I2510 Atherosclerotic heart disease of native coronary artery without angina pectoris: Secondary | ICD-10-CM

## 2011-10-19 DIAGNOSIS — I10 Essential (primary) hypertension: Secondary | ICD-10-CM

## 2011-10-19 DIAGNOSIS — I4892 Unspecified atrial flutter: Secondary | ICD-10-CM

## 2011-10-19 MED ORDER — WARFARIN SODIUM 5 MG PO TABS: ORAL_TABLET | ORAL | Status: DC

## 2011-10-24 ENCOUNTER — Ambulatory Visit (INDEPENDENT_AMBULATORY_CARE_PROVIDER_SITE_OTHER): Payer: Medicare Other | Admitting: Pharmacist

## 2011-10-24 DIAGNOSIS — I4892 Unspecified atrial flutter: Secondary | ICD-10-CM

## 2011-10-24 DIAGNOSIS — Z7901 Long term (current) use of anticoagulants: Secondary | ICD-10-CM

## 2011-10-24 LAB — POCT INR: INR: 2

## 2011-10-25 ENCOUNTER — Encounter (HOSPITAL_COMMUNITY): Payer: Self-pay | Admitting: *Deleted

## 2011-10-25 ENCOUNTER — Emergency Department (INDEPENDENT_AMBULATORY_CARE_PROVIDER_SITE_OTHER)
Admission: EM | Admit: 2011-10-25 | Discharge: 2011-10-25 | Disposition: A | Discharge status: Home / Self Care | Payer: Medicare Other | Source: Home / Self Care | Attending: Family Medicine | Admitting: Family Medicine

## 2011-10-25 ENCOUNTER — Ambulatory Visit: Payer: Medicare Other | Admitting: Internal Medicine

## 2011-10-25 DIAGNOSIS — S61409A Unspecified open wound of unspecified hand, initial encounter: Secondary | ICD-10-CM | POA: Diagnosis not present

## 2011-10-25 DIAGNOSIS — S61412A Laceration without foreign body of left hand, initial encounter: Secondary | ICD-10-CM

## 2011-10-25 DIAGNOSIS — Z23 Encounter for immunization: Secondary | ICD-10-CM

## 2011-10-25 HISTORY — DX: Unspecified atrial flutter: I48.92

## 2011-10-25 MED ORDER — TETANUS-DIPHTH-ACELL PERTUSSIS 5-2.5-18.5 LF-MCG/0.5 IM SUSP
INTRAMUSCULAR | Status: AC
  Filled 2011-10-25: qty 0.5

## 2011-10-25 MED ORDER — TETANUS-DIPHTH-ACELL PERTUSSIS 5-2.5-18.5 LF-MCG/0.5 IM SUSP
0.5000 mL | Freq: Once | INTRAMUSCULAR | Status: DC
  Administered 2011-10-25: 0.5 mL via INTRAMUSCULAR

## 2011-10-25 MED ORDER — TETANUS-DIPHTH-ACELL PERTUSSIS 5-2.5-18.5 LF-MCG/0.5 IM SUSP: 0.5000 mL | Freq: Once | INTRAMUSCULAR | Status: DC

## 2011-10-25 NOTE — ED Notes (Signed)
Pt with onset of laceration left posterior hand 930am this morning - hit hand on edge of equipment

## 2011-10-25 NOTE — ED Provider Notes (Signed)
History     CSN: [id number]  Arrival date & time 10/25/11  1949   First MD Initiated Contact with Patient 10/25/11 1954      Chief Complaint  Patient presents with  . Laceration    (Consider location/radiation/quality/duration/timing/severity/associated sxs/prior treatment) Patient is a 78 y.o. male presenting with skin laceration. The history is provided by the patient and the spouse.  Laceration  The incident occurred 6 to 12 hours ago. The laceration is located on the left hand. The laceration is 1 cm in size. The laceration mechanism was a a metal edge. The pain is at a severity of 0/10. The patient is experiencing no pain. He reports no foreign bodies present. His tetanus status is out of date.    Past Medical History  Diagnosis Date  . Coronary atherosclerosis of unspecified type of vessel, native or graft   . Hypertension   . Hyperlipidemia   . Ulcer   . Diverticulosis of colon (without mention of hemorrhage)   . GERD (gastroesophageal reflux disease)   . Dizziness and giddiness   . Conductive hearing loss, external ear   . Osteoarthrosis, unspecified whether generalized or localized, hand   . Family history of ischemic heart disease   . Personal history of urinary calculi   . Headache   . Osteoarthrosis, unspecified whether generalized or localized, unspecified site   . Allergy   . Atrial flutter     Past Surgical History  Procedure Date  . Total knee arthroplasty   . Cataract extraction     Family History  Problem Relation Age of Onset  . Heart disease Father     History  Substance Use Topics  . Smoking status: Former Smoker    Quit date: 09/11/1961  . Smokeless tobacco: Not on file  . Alcohol Use: No      Review of Systems  Constitutional: Negative.   Skin: Positive for wound.    Allergies  Hydromorphone hcl and Sulfonamide derivatives  Home Medications   Current Outpatient Rx  Name Route Sig Dispense Refill  . ASPIRIN EC 81 MG PO TBEC  Oral Take 1 tablet (81 mg total) by mouth daily.    . DUTASTERIDE 0.5 MG PO CAPS Oral Take 1 capsule (0.5 mg total) by mouth daily. 90 capsule 3  . OMEGA-3 FATTY ACIDS 1000 MG PO CAPS Oral Take 4 g by mouth daily.     Marland Kitchen LORATADINE 10 MG PO TABS Oral Take 1 tablet (10 mg total) by mouth daily. 90 tablet 3  . MULTIVITAMINS PO TABS Oral Take 1 tablet by mouth daily.      Marland Kitchen OLMESARTAN MEDOXOMIL 5 MG PO TABS Oral Take 1 tablet (5 mg total) by mouth daily. 30 tablet 11  . RANITIDINE HCL 150 MG PO TABS Oral Take 1 tablet (150 mg total) by mouth daily. 90 tablet 3  . WARFARIN SODIUM 5 MG PO TABS  Take as directed by anticoagulation clinic 60 tablet 3    30 day supply  . CITALOPRAM HYDROBROMIDE 10 MG PO TABS Oral Take 1 tablet (10 mg total) by mouth as needed. 90 tablet 3  . CYCLOBENZAPRINE HCL 5 MG PO TABS Oral Take 1 tablet (5 mg total) by mouth every 8 (eight) hours as needed for muscle spasms. 30 tablet 1  . HALOBETASOL PROPIONATE 0.05 % EX CREA Topical Apply topically 2 (two) times daily. 50 g 3  . LIPITOR 10 MG PO TABS Oral Take 1 tablet (10 mg total) by mouth  daily. 90 each 3    Dispense as written.  Marland Kitchen NITROGLYCERIN 0.4 MG SL SUBL Sublingual Place 1 tablet (0.4 mg total) under the tongue every 5 (five) minutes as needed for chest pain. 25 tablet 3  . SUMATRIPTAN SUCCINATE 25 MG PO TABS Oral Take 1 tablet (25 mg total) by mouth every 2 (two) hours as needed. 30 tablet 3    BP 163/61  Pulse 63  Temp(Src) 97.6 F (36.4 C) (Oral)  Resp 18  SpO2 98%  Physical Exam  Nursing note and vitals reviewed. Constitutional: He appears well-developed and well-nourished.  Skin: Skin is warm and dry.       V shaped flap to dorsum of left hand, nvt intact, no active bleeding, flap well approx. , betadine soaked, xeroform dsd applied    ED Course  Procedures (including critical care time)  Labs Reviewed - No data to display No results found.   1. Laceration of hand, left       MDM          Barkley Bruns, MD 10/25/11 2022

## 2011-10-25 NOTE — Discharge Instructions (Signed)
Keep dry and clean, leave bandaged until fri , then use bacitracin ointment and bandage as needed., you had a tetanus booster, return or see your doctor if any problems.

## 2011-11-01 ENCOUNTER — Ambulatory Visit (INDEPENDENT_AMBULATORY_CARE_PROVIDER_SITE_OTHER): Payer: Medicare Other | Admitting: Pharmacist

## 2011-11-01 DIAGNOSIS — I4892 Unspecified atrial flutter: Secondary | ICD-10-CM | POA: Diagnosis not present

## 2011-11-01 DIAGNOSIS — Z7901 Long term (current) use of anticoagulants: Secondary | ICD-10-CM

## 2011-11-01 LAB — POCT INR: INR: 2.5

## 2011-11-02 ENCOUNTER — Encounter: Payer: Self-pay | Admitting: Internal Medicine

## 2011-11-09 ENCOUNTER — Ambulatory Visit (INDEPENDENT_AMBULATORY_CARE_PROVIDER_SITE_OTHER): Payer: Medicare Other

## 2011-11-09 ENCOUNTER — Encounter: Payer: Self-pay | Admitting: Internal Medicine

## 2011-11-09 ENCOUNTER — Ambulatory Visit (INDEPENDENT_AMBULATORY_CARE_PROVIDER_SITE_OTHER): Payer: Medicare Other | Admitting: Internal Medicine

## 2011-11-09 VITALS — BP 132/70 | HR 61 | Wt 214.0 lb

## 2011-11-09 DIAGNOSIS — I10 Essential (primary) hypertension: Secondary | ICD-10-CM

## 2011-11-09 DIAGNOSIS — I4892 Unspecified atrial flutter: Secondary | ICD-10-CM | POA: Diagnosis not present

## 2011-11-09 DIAGNOSIS — Z7901 Long term (current) use of anticoagulants: Secondary | ICD-10-CM

## 2011-11-09 NOTE — Assessment & Plan Note (Signed)
Today we discussed the treatment options. He has returned back to sinus rhythm. One option would be to proceed with catheter ablation. A second option would be to continue warfarin and wait until he has recurrent atrial flutter before considering ablation a third option would be to stop warfarin and only consider ablation and additional warfarin if he has recurrent atrial flutter. After discussing the benefits as well as the down sides of each option, he would like to continue warfarin and hold off on catheter ablation.

## 2011-11-09 NOTE — Progress Notes (Signed)
HPI [name] returns today for followup. He is a very pleasant 78 year old man with a history of atrial flutter and hypertension. I saw him back in the office several weeks ago when he was newly diagnosed with atrial flutter. He was placed on Coumadin. He has been therapeutic for the last several weeks and returns today to discuss possible catheter ablation of his atrial flutter. He has felt well. He denies chest pain, shortness of breath, syncope, or peripheral edema. He has not been overly active. Allergies  Allergen Reactions  . Hydromorphone Hcl     REACTION: reaction not listed  . Sulfonamide Derivatives     REACTION: reaction not given     Current Outpatient Prescriptions  Medication Sig Dispense Refill  . aspirin EC 81 MG tablet Take 1 tablet (81 mg total) by mouth daily.      . citalopram (CELEXA) 10 MG tablet Take 1 tablet (10 mg total) by mouth as needed.  90 tablet  3  . cyclobenzaprine (FLEXERIL) 5 MG tablet Take 1 tablet (5 mg total) by mouth every 8 (eight) hours as needed for muscle spasms.  30 tablet  1  . dutasteride (AVODART) 0.5 MG capsule Take 1 capsule (0.5 mg total) by mouth daily.  90 capsule  3  . fish oil-omega-3 fatty acids 1000 MG capsule Take 4 g by mouth daily.       . halobetasol (ULTRAVATE) 0.05 % cream Apply topically 2 (two) times daily.  50 g  3  . LIPITOR 10 MG tablet Take 1 tablet (10 mg total) by mouth daily.  90 each  3  . loratadine (CLARITIN) 10 MG tablet Take 1 tablet (10 mg total) by mouth daily.  90 tablet  3  . multivitamin (THERAGRAN) per tablet Take 1 tablet by mouth daily.        . nitroGLYCERIN (NITROSTAT) 0.4 MG SL tablet Place 1 tablet (0.4 mg total) under the tongue every 5 (five) minutes as needed for chest pain.  25 tablet  3  . olmesartan (BENICAR) 5 MG tablet Take 1 tablet (5 mg total) by mouth daily.  30 tablet  11  . ranitidine (ZANTAC) 150 MG tablet Take 1 tablet (150 mg total) by mouth daily.  90 tablet  3  . SUMAtriptan  (IMITREX) 25 MG tablet Take 1 tablet (25 mg total) by mouth every 2 (two) hours as needed.  30 tablet  3  . warfarin (COUMADIN) 5 MG tablet Take as directed by anticoagulation clinic  60 tablet  3     Past Medical History  Diagnosis Date  . Coronary atherosclerosis of unspecified type of vessel, native or graft   . Hypertension   . Hyperlipidemia   . Ulcer   . Diverticulosis of colon (without mention of hemorrhage)   . GERD (gastroesophageal reflux disease)   . Dizziness and giddiness   . Conductive hearing loss, external ear   . Osteoarthrosis, unspecified whether generalized or localized, hand   . Family history of ischemic heart disease   . Personal history of urinary calculi   . Headache   . Osteoarthrosis, unspecified whether generalized or localized, unspecified site   . Allergy   . Atrial flutter   . Hemorrhoids   . Renal calculi     ROS:   All systems reviewed and negative except as noted in the HPI.   Past Surgical History  Procedure Date  . Total knee arthroplasty   . Cataract extraction 2005  . Foot surgery  1949  . Vasectomy 1971  . Kidney stone surgery 1995     Family History  Problem Relation Age of Onset  . Heart disease Father   . Hypertension Mother   . Arthritis Mother   . Diabetes Other     runs in family  . Stroke Other      History   Social History  . Marital Status: Married    Spouse Name: N/A    Number of Children: 1  . Years of Education: N/A   Occupational History  .     Social History Main Topics  . Smoking status: Former Smoker    Quit date: 09/11/1961  . Smokeless tobacco: Not on file  . Alcohol Use: No  . Drug Use: No  . Sexually Active: Yes   Other Topics Concern  . Not on file   Social History Narrative  . No narrative on file     BP 132/70  Pulse 61  Wt 97.07 kg (214 lb)  SpO2 96%  Physical Exam:  Well appearing elderly man, NAD HEENT: Unremarkable Neck:  No JVD, no thyromegally Lungs:  Clear with  no wheezes, rales, or rhonchi. HEART:  Regular rate rhythm, no murmurs, no rubs, no clicks Abd:  soft, positive bowel sounds, no organomegally, no rebound, no guarding Ext:  2 plus pulses, no edema, no cyanosis, no clubbing Skin:  No rashes no nodules Neuro:  CN II through XII intact, motor grossly intact  EKG - normal sinus rhythm with normal axis and intervals. Right bundle branch block is present  Assess/Plan:

## 2011-11-09 NOTE — Assessment & Plan Note (Signed)
His blood pressure appears to be well-controlled. He will continue his current medical therapy and maintain a low-sodium diet. 

## 2011-11-09 NOTE — Patient Instructions (Signed)
Your physician wants you to follow-up in: 3-4 months with Dr Taylor You will receive a reminder letter in the mail two months in advance. If you don't receive a letter, please call our office to schedule the follow-up appointment.  

## 2011-11-16 ENCOUNTER — Ambulatory Visit (INDEPENDENT_AMBULATORY_CARE_PROVIDER_SITE_OTHER): Payer: Medicare Other | Admitting: *Deleted

## 2011-11-16 DIAGNOSIS — Z7901 Long term (current) use of anticoagulants: Secondary | ICD-10-CM | POA: Diagnosis not present

## 2011-11-16 DIAGNOSIS — I4892 Unspecified atrial flutter: Secondary | ICD-10-CM | POA: Diagnosis not present

## 2011-11-16 LAB — POCT INR: INR: 2.6

## 2011-11-17 ENCOUNTER — Other Ambulatory Visit: Payer: Self-pay | Admitting: *Deleted

## 2011-11-17 MED ORDER — ATORVASTATIN CALCIUM 10 MG PO TABS: 10.0000 mg | ORAL_TABLET | Freq: Every day | ORAL | Status: DC

## 2011-11-29 ENCOUNTER — Encounter: Payer: Self-pay | Admitting: Internal Medicine

## 2011-11-29 ENCOUNTER — Ambulatory Visit (INDEPENDENT_AMBULATORY_CARE_PROVIDER_SITE_OTHER): Payer: Medicare Other | Admitting: Internal Medicine

## 2011-11-29 VITALS — BP 136/80 | HR 72 | Temp 98.3°F | Resp 16 | Ht 73.0 in | Wt 214.0 lb

## 2011-11-29 DIAGNOSIS — I4892 Unspecified atrial flutter: Secondary | ICD-10-CM

## 2011-11-29 DIAGNOSIS — R21 Rash and other nonspecific skin eruption: Secondary | ICD-10-CM

## 2011-11-29 DIAGNOSIS — I10 Essential (primary) hypertension: Secondary | ICD-10-CM | POA: Diagnosis not present

## 2011-11-29 DIAGNOSIS — I2510 Atherosclerotic heart disease of native coronary artery without angina pectoris: Secondary | ICD-10-CM

## 2011-11-29 MED ORDER — OLMESARTAN 10 MG HALF TABLET: 10.0000 mg | ORAL_TABLET | Freq: Every day | ORAL | Status: DC

## 2011-11-29 MED ORDER — FLUCONAZOLE 100 MG PO TABS: 100.0000 mg | ORAL_TABLET | Freq: Every day | ORAL | Status: AC

## 2011-11-29 NOTE — Progress Notes (Signed)
Subjective:    Patient ID: Sean Lane, male    DOB: [birth year], 78 y.o.   MRN: [id number]  HPI  We had a discussion with the patient about changing from a new or anticoagulant back to Coumadin for anticoagulation because of the possibility that there may be an intervention for his dysrhythmia in the short-term.  He was scheduled for conversion and he has converted into normal sinus rhythm but he is at high risk for paroxysmal atrial fibrillation. He has a reevaluation with cardiology within the month at which time the OB discussion about discontinuation of Coumadin at that point if his risk stratification for paroxysmal atrial fibrillation is reasonable then he would be on aspirin therapy alone if there is significant risk then consideration for low-dose new agent may be a part of his Paradigm.  His blood pressure is stable. Lipids have been stable on Lipitor 10 mg by mouth daily he is on Celexa for mild mood support    Review of Systems  Constitutional: Negative for fever and fatigue.  HENT: Negative for hearing loss, congestion, neck pain and postnasal drip.   Eyes: Negative for discharge, redness and visual disturbance.  Respiratory: Negative for cough, shortness of breath and wheezing.   Cardiovascular: Negative for leg swelling.  Gastrointestinal: Negative for abdominal pain, constipation and abdominal distention.  Genitourinary: Negative for urgency and frequency.  Musculoskeletal: Negative for joint swelling and arthralgias.  Skin: Negative for color change and rash.  Neurological: Negative for weakness and light-headedness.  Hematological: Negative for adenopathy.  Psychiatric/Behavioral: Negative for behavioral problems.   Past Medical History  Diagnosis Date  . Coronary atherosclerosis of unspecified type of vessel, native or graft   . Hypertension   . Hyperlipidemia   . Ulcer   . Diverticulosis of colon (without mention of hemorrhage)   . GERD  (gastroesophageal reflux disease)   . Dizziness and giddiness   . Conductive hearing loss, external ear   . Osteoarthrosis, unspecified whether generalized or localized, hand   . Family history of ischemic heart disease   . Personal history of urinary calculi   . Headache   . Osteoarthrosis, unspecified whether generalized or localized, unspecified site   . Allergy   . Atrial flutter   . Hemorrhoids   . Renal calculi     History   Social History  . Marital Status: Married    Spouse Name: N/A    Number of Children: 1  . Years of Education: N/A   Occupational History  .     Social History Main Topics  . Smoking status: Former Smoker    Quit date: 09/11/1961  . Smokeless tobacco: Not on file  . Alcohol Use: No  . Drug Use: No  . Sexually Active: Yes   Other Topics Concern  . Not on file   Social History Narrative  . No narrative on file    Past Surgical History  Procedure Date  . Total knee arthroplasty   . Cataract extraction 2005  . Foot surgery 1949  . Vasectomy 1971  . Kidney stone surgery 1995    Family History  Problem Relation Age of Onset  . Heart disease Father   . Hypertension Mother   . Arthritis Mother   . Diabetes Other     runs in family  . Stroke Other     Allergies  Allergen Reactions  . Hydromorphone Hcl     REACTION: reaction not listed  . Sulfonamide Derivatives  REACTION: reaction not given    Current Outpatient Prescriptions on File Prior to Visit  Medication Sig Dispense Refill  . aspirin EC 81 MG tablet Take 1 tablet (81 mg total) by mouth daily.      Marland Kitchen atorvastatin (LIPITOR) 10 MG tablet Take 1 tablet (10 mg total) by mouth daily.  90 tablet  3  . citalopram (CELEXA) 10 MG tablet Take 1 tablet (10 mg total) by mouth as needed.  90 tablet  3  . cyclobenzaprine (FLEXERIL) 5 MG tablet Take 1 tablet (5 mg total) by mouth every 8 (eight) hours as needed for muscle spasms.  30 tablet  1  . dutasteride (AVODART) 0.5 MG capsule  Take 1 capsule (0.5 mg total) by mouth daily.  90 capsule  3  . fish oil-omega-3 fatty acids 1000 MG capsule Take 4 g by mouth daily.       . halobetasol (ULTRAVATE) 0.05 % cream Apply topically 2 (two) times daily.  50 g  3  . loratadine (CLARITIN) 10 MG tablet Take 1 tablet (10 mg total) by mouth daily.  90 tablet  3  . multivitamin (THERAGRAN) per tablet Take 1 tablet by mouth daily.        . nitroGLYCERIN (NITROSTAT) 0.4 MG SL tablet Place 1 tablet (0.4 mg total) under the tongue every 5 (five) minutes as needed for chest pain.  25 tablet  3  . olmesartan (BENICAR) 5 MG tablet Take 1 tablet (5 mg total) by mouth daily.  30 tablet  11  . ranitidine (ZANTAC) 150 MG tablet Take 1 tablet (150 mg total) by mouth daily.  90 tablet  3  . SUMAtriptan (IMITREX) 25 MG tablet Take 1 tablet (25 mg total) by mouth every 2 (two) hours as needed.  30 tablet  3  . warfarin (COUMADIN) 5 MG tablet Take as directed by anticoagulation clinic  60 tablet  3    BP 136/80  Pulse 72  Temp 98.3 F (36.8 C)  Resp 16  Ht 6\' 1"  (1.854 m)  Wt 214 lb (97.07 kg)  BMI 28.23 kg/m2       Objective:   Physical Exam  Nursing note and vitals reviewed. Constitutional: He appears well-developed and well-nourished.  HENT:  Head: Normocephalic and atraumatic.  Eyes: Conjunctivae are normal. Pupils are equal, round, and reactive to light.  Neck: Normal range of motion. Neck supple.  Cardiovascular: Normal rate and regular rhythm.   Pulmonary/Chest: Effort normal and breath sounds normal.  Abdominal: Soft. Bowel sounds are normal.          Assessment & Plan:  Patient has hypertension and has been a call was decreased to 5 mg by mouth daily he has noticed elevated blood pressures and his blood pressures are borderline range we increased the Benicar back to 10 mg by mouth daily.  If this is a comfortable life and has been stable there was a believe that this was stress induced. He is now in sinus rhythm and has  an appointment with cardiology in June he remains in sinus rhythm there was discussion to discontinue the Coumadin at that point.

## 2011-12-06 ENCOUNTER — Ambulatory Visit: Payer: Medicare Other | Admitting: Internal Medicine

## 2011-12-11 ENCOUNTER — Other Ambulatory Visit: Payer: Self-pay | Admitting: *Deleted

## 2011-12-11 ENCOUNTER — Ambulatory Visit (INDEPENDENT_AMBULATORY_CARE_PROVIDER_SITE_OTHER): Payer: Medicare Other | Admitting: Pharmacist

## 2011-12-11 DIAGNOSIS — Z7901 Long term (current) use of anticoagulants: Secondary | ICD-10-CM

## 2011-12-11 DIAGNOSIS — I4892 Unspecified atrial flutter: Secondary | ICD-10-CM | POA: Diagnosis not present

## 2011-12-11 DIAGNOSIS — B359 Dermatophytosis, unspecified: Secondary | ICD-10-CM

## 2011-12-11 MED ORDER — FLUCONAZOLE 100 MG PO TABS: 100.0000 mg | ORAL_TABLET | Freq: Every day | ORAL | Status: AC

## 2011-12-25 ENCOUNTER — Ambulatory Visit (INDEPENDENT_AMBULATORY_CARE_PROVIDER_SITE_OTHER): Payer: Medicare Other | Admitting: Pharmacist

## 2011-12-25 ENCOUNTER — Telehealth: Payer: Self-pay | Admitting: *Deleted

## 2011-12-25 DIAGNOSIS — I4892 Unspecified atrial flutter: Secondary | ICD-10-CM | POA: Diagnosis not present

## 2011-12-25 DIAGNOSIS — Z7901 Long term (current) use of anticoagulants: Secondary | ICD-10-CM | POA: Diagnosis not present

## 2011-12-25 NOTE — Telephone Encounter (Signed)
Pt sent fax stating that he had taken  diflucan for 14 days and dint see much change- per dr Lovell Sheehan- go to derm for evaluation--appointment with dr Terri Piedra tomorrow 4-16 at 10:10--pt informed

## 2011-12-26 DIAGNOSIS — L259 Unspecified contact dermatitis, unspecified cause: Secondary | ICD-10-CM | POA: Diagnosis not present

## 2011-12-26 NOTE — Patient Instructions (Signed)
Spoke with pt's wife.  He is going to take his last dose of fluconazole today (4/16) and then change to topical treatment for his ring worm.  Will go back to previous dose of Coumadin- 7.5mg  daily except 10mg  on Tuesday and Thursday.  Recheck INR in 2 weeks.  Appt made for 4/30.

## 2012-01-09 ENCOUNTER — Ambulatory Visit (INDEPENDENT_AMBULATORY_CARE_PROVIDER_SITE_OTHER): Payer: Medicare Other

## 2012-01-09 DIAGNOSIS — Z7901 Long term (current) use of anticoagulants: Secondary | ICD-10-CM

## 2012-01-09 DIAGNOSIS — I4892 Unspecified atrial flutter: Secondary | ICD-10-CM | POA: Diagnosis not present

## 2012-01-09 DIAGNOSIS — B354 Tinea corporis: Secondary | ICD-10-CM | POA: Diagnosis not present

## 2012-01-09 LAB — POCT INR: INR: 2.8

## 2012-01-22 ENCOUNTER — Telehealth: Payer: Self-pay | Admitting: Internal Medicine

## 2012-01-22 MED ORDER — BISOPROLOL FUMARATE 5 MG PO TABS: 2.5000 mg | ORAL_TABLET | Freq: Every day | ORAL | Status: DC

## 2012-01-22 NOTE — Telephone Encounter (Signed)
Pt blood pressure is 153/83 and wife is very concerned that he husband is stressed out because of recent news they received about her health.please contact

## 2012-01-22 NOTE — Telephone Encounter (Signed)
Per dr Lovell Sheehan- try bisoprolol 2.5 qd Wife informed

## 2012-01-30 ENCOUNTER — Ambulatory Visit (INDEPENDENT_AMBULATORY_CARE_PROVIDER_SITE_OTHER): Payer: Medicare Other | Admitting: Pharmacist

## 2012-01-30 DIAGNOSIS — Z7901 Long term (current) use of anticoagulants: Secondary | ICD-10-CM | POA: Diagnosis not present

## 2012-01-30 DIAGNOSIS — I4892 Unspecified atrial flutter: Secondary | ICD-10-CM | POA: Diagnosis not present

## 2012-02-01 ENCOUNTER — Telehealth: Payer: Self-pay | Admitting: Internal Medicine

## 2012-02-01 NOTE — Telephone Encounter (Signed)
dont take bisoprolol until h e hears from [place] in am

## 2012-02-01 NOTE — Telephone Encounter (Signed)
Pt would like to be taken off of  bisoprolol (ZEBETA) 5 MG tablet because his blood pressure is very low.  Please contact

## 2012-02-02 ENCOUNTER — Other Ambulatory Visit: Payer: Self-pay | Admitting: *Deleted

## 2012-02-02 NOTE — Telephone Encounter (Signed)
Per dr Lovell Sheehan- may stop bisoprolol- wife informed

## 2012-02-12 ENCOUNTER — Other Ambulatory Visit: Payer: Self-pay | Admitting: *Deleted

## 2012-02-12 MED ORDER — OLMESARTAN MEDOXOMIL-HCTZ 20-12.5 MG PO TABS: 1.0000 | ORAL_TABLET | Freq: Every day | ORAL | Status: DC

## 2012-02-12 NOTE — Telephone Encounter (Signed)
Pt sent letter stating that he was having fluctuating bp at times--per dr Lovell Sheehan- stop zebeta and start benicar 20/12,5 qd-pt informed and med sent to p [facility]

## 2012-02-26 ENCOUNTER — Ambulatory Visit (INDEPENDENT_AMBULATORY_CARE_PROVIDER_SITE_OTHER): Payer: Medicare Other | Admitting: *Deleted

## 2012-02-26 DIAGNOSIS — I4892 Unspecified atrial flutter: Secondary | ICD-10-CM

## 2012-02-26 DIAGNOSIS — Z7901 Long term (current) use of anticoagulants: Secondary | ICD-10-CM | POA: Diagnosis not present

## 2012-03-05 ENCOUNTER — Encounter: Payer: Self-pay | Admitting: Internal Medicine

## 2012-03-05 ENCOUNTER — Ambulatory Visit: Payer: Medicare Other | Admitting: Internal Medicine

## 2012-03-05 ENCOUNTER — Ambulatory Visit (INDEPENDENT_AMBULATORY_CARE_PROVIDER_SITE_OTHER): Payer: Medicare Other | Admitting: Internal Medicine

## 2012-03-05 VITALS — BP 140/68 | HR 58 | Resp 18 | Ht 73.0 in | Wt 216.8 lb

## 2012-03-05 DIAGNOSIS — I495 Sick sinus syndrome: Secondary | ICD-10-CM | POA: Diagnosis not present

## 2012-03-05 DIAGNOSIS — I4892 Unspecified atrial flutter: Secondary | ICD-10-CM

## 2012-03-05 NOTE — Assessment & Plan Note (Signed)
He is maintaining normal rhythm and is asymptomatic.

## 2012-03-05 NOTE — Assessment & Plan Note (Signed)
He is maintaining normal rhythm. No indication for ablation. He will continue anticoagulation. He will restart exercising.

## 2012-03-05 NOTE — Patient Instructions (Addendum)
Your physician wants you to follow-up in: 12 months with Dr. Taylor. You will receive a reminder letter in the mail two months in advance. If you don't receive a letter, please call our office to schedule the follow-up appointment.    

## 2012-03-05 NOTE — Progress Notes (Signed)
HPI [name] returns today for followup. He is a very pleasant 78 year old man with a history of atrial flutter, hypertension, and sinus bradycardia. The patient has maintaining sinus rhythm since last February. I had initially seen him in January and he was in atrial flutter with a controlled ventricular response. He spontaneously reverted to sinus rhythm. He was placed on anticoagulation with Coumadin. He denies chest pain or shortness of breath. His only complaint today is that he misses not being able to exercise. I asked him why he was not exercising and he was not certain but thought he has not been supposed to. No syncope and no peripheral edema. No chest pain. Allergies  Allergen Reactions  . Hydromorphone Hcl     REACTION: reaction not listed  . Sulfonamide Derivatives     REACTION: reaction not given     Current Outpatient Prescriptions  Medication Sig Dispense Refill  . aspirin EC 81 MG tablet Take 1 tablet (81 mg total) by mouth daily.      Marland Kitchen atorvastatin (LIPITOR) 10 MG tablet Take 1 tablet (10 mg total) by mouth daily.  90 tablet  3  . bisoprolol (ZEBETA) 5 MG tablet       . citalopram (CELEXA) 10 MG tablet Take 1 tablet (10 mg total) by mouth as needed.  90 tablet  3  . cyclobenzaprine (FLEXERIL) 5 MG tablet Take 1 tablet (5 mg total) by mouth every 8 (eight) hours as needed for muscle spasms.  30 tablet  1  . dutasteride (AVODART) 0.5 MG capsule Take 1 capsule (0.5 mg total) by mouth daily.  90 capsule  3  . fish oil-omega-3 fatty acids 1000 MG capsule Take 4 g by mouth daily.       . halobetasol (ULTRAVATE) 0.05 % cream Apply topically 2 (two) times daily.  50 g  3  . loratadine (CLARITIN) 10 MG tablet Take 1 tablet (10 mg total) by mouth daily.  90 tablet  3  . multivitamin (THERAGRAN) per tablet Take 1 tablet by mouth daily.        . nitroGLYCERIN (NITROSTAT) 0.4 MG SL tablet Place 1 tablet (0.4 mg total) under the tongue every 5 (five) minutes as needed for chest pain.   25 tablet  3  . olmesartan-hydrochlorothiazide (BENICAR HCT) 20-12.5 MG per tablet Take 1 tablet by mouth daily.  30 tablet  6  . ranitidine (ZANTAC) 150 MG tablet Take 1 tablet (150 mg total) by mouth daily.  90 tablet  3  . SUMAtriptan (IMITREX) 25 MG tablet Take 1 tablet (25 mg total) by mouth every 2 (two) hours as needed.  30 tablet  3  . warfarin (COUMADIN) 5 MG tablet Take as directed by anticoagulation clinic  60 tablet  3     Past Medical History  Diagnosis Date  . Coronary atherosclerosis of unspecified type of vessel, native or graft   . Hypertension   . Hyperlipidemia   . Ulcer   . Diverticulosis of colon (without mention of hemorrhage)   . GERD (gastroesophageal reflux disease)   . Dizziness and giddiness   . Conductive hearing loss, external ear   . Osteoarthrosis, unspecified whether generalized or localized, hand   . Family history of ischemic heart disease   . Personal history of urinary calculi   . Headache   . Osteoarthrosis, unspecified whether generalized or localized, unspecified site   . Allergy   . Atrial flutter   . Hemorrhoids   . Renal calculi  ROS:   All systems reviewed and negative except as noted in the HPI.   Past Surgical History  Procedure Date  . Total knee arthroplasty   . Cataract extraction 2005  . Foot surgery 1949  . Vasectomy 1971  . Kidney stone surgery 1995     Family History  Problem Relation Age of Onset  . Heart disease Father   . Hypertension Mother   . Arthritis Mother   . Diabetes Other     runs in family  . Stroke Other      History   Social History  . Marital Status: Married    Spouse Name: N/A    Number of Children: 1  . Years of Education: N/A   Occupational History  .     Social History Main Topics  . Smoking status: Former Smoker    Quit date: 09/11/1961  . Smokeless tobacco: Not on file  . Alcohol Use: No  . Drug Use: No  . Sexually Active: Yes   Other Topics Concern  . Not on file    Social History Narrative  . No narrative on file     BP 140/68  Pulse 58  Resp 18  Ht 6\' 1"  (1.854 m)  Wt 216 lb 12.8 oz (98.34 kg)  BMI 28.60 kg/m2  Physical Exam:  Well appearing elderly man who looks younger than his stated age and in NAD HEENT: Unremarkable Neck:  No JVD, no thyromegally Lungs:  Clear with no wheezes, rales, or rhonchi. HEART:  Regular bradycardia rhythm, no murmurs, no rubs, no clicks Abd:  soft, positive bowel sounds, no organomegally, no rebound, no guarding Ext:  2 plus pulses, no edema, no cyanosis, no clubbing Skin:  No rashes no nodules Neuro:  CN II through XII intact, motor grossly intact  EKG Sinus bradycardia with right bundle branch block  Assess/Plan:

## 2012-03-06 ENCOUNTER — Encounter: Payer: Self-pay | Admitting: Internal Medicine

## 2012-03-06 ENCOUNTER — Ambulatory Visit (INDEPENDENT_AMBULATORY_CARE_PROVIDER_SITE_OTHER): Payer: Medicare Other | Admitting: Internal Medicine

## 2012-03-06 VITALS — BP 120/72 | HR 72 | Temp 98.6°F | Resp 16 | Ht 73.0 in | Wt 216.0 lb

## 2012-03-06 DIAGNOSIS — I4892 Unspecified atrial flutter: Secondary | ICD-10-CM

## 2012-03-06 DIAGNOSIS — E785 Hyperlipidemia, unspecified: Secondary | ICD-10-CM

## 2012-03-06 DIAGNOSIS — I10 Essential (primary) hypertension: Secondary | ICD-10-CM

## 2012-03-06 MED ORDER — RIVAROXABAN 20 MG PO TABS: 20.0000 mg | ORAL_TABLET | Freq: Every day | ORAL | Status: DC

## 2012-03-06 MED ORDER — CARVEDILOL 3.125 MG PO TABS: 3.1250 mg | ORAL_TABLET | Freq: Two times a day (BID) | ORAL | Status: DC

## 2012-03-06 NOTE — Patient Instructions (Signed)
We will first convert from the Benicar to the carvedilol starting tomorrow start the carvedilol 3.125 twice daily on Monday do not take your Coumadin dose and begin the xaralto on Tuesday

## 2012-03-06 NOTE — Progress Notes (Signed)
Subjective:    Patient ID: Sean Lane, male    DOB: 1928-01-10, 78 y.o.   MRN: [id number]  HPI Discussion of options for anticoagulation coumadin vs pradaxa and newer agents Pt does not mind the coumdin checks CHronic AF We discussed the risk and benefits of the newer agents and there superior artery the Coumadin in certain settings.  The patient states his blood pressure has been fluctuating greatly and since he started the diuretic he has noticed an increased not too urea.  There was a slight bump in his BUN and creatinine as well.  Although blood pressure stable at the office visit today he is noticed blood pressure changes his pulse is 72 and he no longer experiences any bradycardia   Review of Systems  Constitutional: Negative for fever and fatigue.  HENT: Negative for hearing loss, congestion, neck pain and postnasal drip.   Eyes: Negative for discharge, redness and visual disturbance.  Respiratory: Negative for cough, shortness of breath and wheezing.   Cardiovascular: Negative for leg swelling.  Gastrointestinal: Negative for abdominal pain, constipation and abdominal distention.  Genitourinary: Negative for urgency and frequency.  Musculoskeletal: Negative for joint swelling and arthralgias.  Skin: Negative for color change and rash.  Neurological: Negative for weakness and light-headedness.  Hematological: Negative for adenopathy.  Psychiatric/Behavioral: Negative for behavioral problems.       Past Medical History  Diagnosis Date  . Coronary atherosclerosis of unspecified type of vessel, native or graft   . Hypertension   . Hyperlipidemia   . Ulcer   . Diverticulosis of colon (without mention of hemorrhage)   . GERD (gastroesophageal reflux disease)   . Dizziness and giddiness   . Conductive hearing loss, external ear   . Osteoarthrosis, unspecified whether generalized or localized, hand   . Family history of ischemic heart disease   . Personal history  of urinary calculi   . Headache   . Osteoarthrosis, unspecified whether generalized or localized, unspecified site   . Allergy   . Atrial flutter   . Hemorrhoids   . Renal calculi     History   Social History  . Marital Status: Married    Spouse Name: N/A    Number of Children: 1  . Years of Education: N/A   Occupational History  .     Social History Main Topics  . Smoking status: Former Smoker    Quit date: 09/11/1961  . Smokeless tobacco: Not on file  . Alcohol Use: No  . Drug Use: No  . Sexually Active: Yes   Other Topics Concern  . Not on file   Social History Narrative  . No narrative on file    Past Surgical History  Procedure Date  . Total knee arthroplasty   . Cataract extraction 2005  . Foot surgery 1949  . Vasectomy 1971  . Kidney stone surgery 1995    Family History  Problem Relation Age of Onset  . Heart disease Father   . Hypertension Mother   . Arthritis Mother   . Diabetes Other     runs in family  . Stroke Other     Allergies  Allergen Reactions  . Hydromorphone Hcl     REACTION: reaction not listed  . Sulfonamide Derivatives     REACTION: reaction not given    Current Outpatient Prescriptions on File Prior to Visit  Medication Sig Dispense Refill  . aspirin EC 81 MG tablet Take 1 tablet (81 mg total) by mouth daily.      Marland Kitchen  atorvastatin (LIPITOR) 10 MG tablet Take 1 tablet (10 mg total) by mouth daily.  90 tablet  3  . citalopram (CELEXA) 10 MG tablet Take 1 tablet (10 mg total) by mouth as needed.  90 tablet  3  . dutasteride (AVODART) 0.5 MG capsule Take 1 capsule (0.5 mg total) by mouth daily.  90 capsule  3  . fish oil-omega-3 fatty acids 1000 MG capsule Take 4 g by mouth daily.       . halobetasol (ULTRAVATE) 0.05 % cream Apply topically 2 (two) times daily.  50 g  3  . multivitamin (THERAGRAN) per tablet Take 1 tablet by mouth daily.        . nitroGLYCERIN (NITROSTAT) 0.4 MG SL tablet Place 1 tablet (0.4 mg total) under the  tongue every 5 (five) minutes as needed for chest pain.  25 tablet  3  . ranitidine (ZANTAC) 150 MG tablet Take 1 tablet (150 mg total) by mouth daily.  90 tablet  3  . SUMAtriptan (IMITREX) 25 MG tablet Take 1 tablet (25 mg total) by mouth every 2 (two) hours as needed.  30 tablet  3  . warfarin (COUMADIN) 5 MG tablet Take as directed by anticoagulation clinic  60 tablet  3  . DISCONTD: loratadine (CLARITIN) 10 MG tablet Take 1 tablet (10 mg total) by mouth daily.  90 tablet  3  . carvedilol (COREG) 3.125 MG tablet Take 1 tablet (3.125 mg total) by mouth 2 (two) times daily with a meal.  60 tablet  3    BP 120/72  Pulse 72  Temp 98.6 F (37 C)  Resp 16  Ht 6\' 1"  (1.854 m)  Wt 216 lb (97.977 kg)  BMI 28.50 kg/m2    Objective:   Physical Exam  Nursing note and vitals reviewed. Constitutional: He appears well-developed and well-nourished.  HENT:  Head: Normocephalic and atraumatic.  Eyes: Conjunctivae are normal. Pupils are equal, round, and reactive to light.  Neck: Normal range of motion. Neck supple.  Cardiovascular: Normal rate and regular rhythm.   Pulmonary/Chest: Effort normal and breath sounds normal.  Abdominal: Soft. Bowel sounds are normal.          Assessment & Plan:  Plan today will begin carvedilol twice daily instead of the Benicar HCT and monitors blood pressure response over the weekend if he has a stable response to the Coreg we would then begin the new anticoagulation drug on Tuesday  Risk and benefits of these changes discussed  I have spent more than 30 minutes examining this patient face-to-face of which over half was spent in counseling

## 2012-03-28 ENCOUNTER — Ambulatory Visit (INDEPENDENT_AMBULATORY_CARE_PROVIDER_SITE_OTHER): Payer: Medicare Other | Admitting: Cardiovascular Disease

## 2012-03-28 ENCOUNTER — Encounter: Payer: Self-pay | Admitting: Cardiovascular Disease

## 2012-03-28 VITALS — BP 142/82 | HR 70 | Ht 73.0 in | Wt 219.0 lb

## 2012-03-28 DIAGNOSIS — I4892 Unspecified atrial flutter: Secondary | ICD-10-CM

## 2012-03-28 DIAGNOSIS — R319 Hematuria, unspecified: Secondary | ICD-10-CM

## 2012-03-28 DIAGNOSIS — E785 Hyperlipidemia, unspecified: Secondary | ICD-10-CM

## 2012-03-28 DIAGNOSIS — I2510 Atherosclerotic heart disease of native coronary artery without angina pectoris: Secondary | ICD-10-CM

## 2012-03-28 LAB — URINALYSIS, ROUTINE W REFLEX MICROSCOPIC
Bilirubin Urine: NEGATIVE
Leukocytes, UA: NEGATIVE
Nitrite: NEGATIVE
Specific Gravity, Urine: 1.02 (ref 1.000–1.030)
Urobilinogen, UA: 0.2 (ref 0.0–1.0)
pH: 6 (ref 5.0–8.0)

## 2012-03-28 MED ORDER — RIVAROXABAN 10 MG PO TABS: 20.0000 mg | ORAL_TABLET | Freq: Every day | ORAL | Status: DC

## 2012-03-28 NOTE — Assessment & Plan Note (Signed)
Cholesterol is at goal.  Continue current dose of statin and diet Rx.  No myalgias or side effects.  F/U  LFT's in 6 months. Lab Results  Component Value Date   LDLCALC 74 01/17/2011             

## 2012-03-28 NOTE — Progress Notes (Signed)
Patient ID: Sean Lane, male   DOB: 10/04/1927, 78 y.o.   MRN: [id number] Sean Lane returns today for followup. He is a very pleasant 78 year old man with a history of atrial flutter, hypertension, and sinus bradycardia. The patient has maintaining sinus rhythm since last February. I had initially seen him in January and he was in atrial flutter with a controlled ventricular response. He spontaneously reverted to sinus rhythm. He was placed on anticoagulation with Coumadin. He denies chest pain or shortness of breath. His only complaint today is that he misses not being able to exercise. I asked him why he was not exercising and he was not certain but thought he has not been supposed to. No syncope and no peripheral edema. No chest pain.  Changed to xarelto end of June having microhematuria Sees Grapey for prostate  He has a history of CAD, s/p PCI to CFX in 2009 with Cypher DES x2, hypertension, hyperlipidemia, right bundle branch block, GERD, BPH. LHC 3/09: Mid RCA 50%, left main 20%, proximal LAD 40-50%, mid LAD 40%, proximal circumflex 80% and an 80% after OM2, left PDA 40%, EF 60%. PCI: Cypher DES x2 to the proximal and mid circumflex. Monitor in 2011 demonstrated NSR, PACs, right bundle branch block, no long pauses, no SVT, no high-grade heart block. Last exercise treadmill test 1/12: Heart rate increased appropriately, no ischemic EKG changes. Last seen by me  7/12.   . ROS: Denies fever, malais, weight loss, blurry vision, decreased visual acuity, cough, sputum, SOB, hemoptysis, pleuritic pain, palpitaitons, heartburn, abdominal pain, melena, lower extremity edema, claudication, or rash.  All other systems reviewed and negative  General: Affect appropriate Healthy:  appears stated age HEENT: normal Neck supple with no adenopathy JVP normal no bruits no thyromegaly Lungs clear with no wheezing and good diaphragmatic motion Heart:  S1/S2 no murmur, no rub, gallop or click PMI  normal Abdomen: benighn, BS positve, no tenderness, no AAA no bruit.  No HSM or HJR Distal pulses intact with no bruits No edema Neuro non-focal Skin warm and dry No muscular weakness   Current Outpatient Prescriptions  Medication Sig Dispense Refill  . aspirin EC 81 MG tablet Take 1 tablet (81 mg total) by mouth daily.      Marland Kitchen atorvastatin (LIPITOR) 10 MG tablet Take 1 tablet (10 mg total) by mouth daily.  90 tablet  3  . BENICAR HCT 20-12.5 MG per tablet Take 1 tablet by mouth Daily.      . carvedilol (COREG) 3.125 MG tablet Take 1 tablet (3.125 mg total) by mouth 2 (two) times daily with a meal.  60 tablet  3  . citalopram (CELEXA) 10 MG tablet Take 1 tablet (10 mg total) by mouth as needed.  90 tablet  3  . dutasteride (AVODART) 0.5 MG capsule Take 1 capsule (0.5 mg total) by mouth daily.  90 capsule  3  . fish oil-omega-3 fatty acids 1000 MG capsule Take 4 g by mouth daily.       . halobetasol (ULTRAVATE) 0.05 % cream Apply topically 2 (two) times daily.  50 g  3  . loratadine (CLARITIN) 10 MG tablet Take 10 mg by mouth daily as needed.      . multivitamin (THERAGRAN) per tablet Take 1 tablet by mouth daily.        . nitroGLYCERIN (NITROSTAT) 0.4 MG SL tablet Place 1 tablet (0.4 mg total) under the tongue every 5 (five) minutes as needed for chest pain.  25 tablet  3  . ranitidine (ZANTAC) 150 MG tablet Take 1 tablet (150 mg total) by mouth daily.  90 tablet  3  . Rivaroxaban (XARELTO) 20 MG TABS Take 1 tablet (20 mg total) by mouth daily with supper.  30 tablet  11  . SUMAtriptan (IMITREX) 25 MG tablet Take 1 tablet (25 mg total) by mouth every 2 (two) hours as needed.  30 tablet  3  . warfarin (COUMADIN) 5 MG tablet Take as directed by anticoagulation clinic  60 tablet  3    Allergies  Hydromorphone hcl and Sulfonamide derivatives  Electrocardiogram:  Assessment and Plan

## 2012-03-28 NOTE — Assessment & Plan Note (Signed)
Possible microhematuria since starting xarelto  F/U UA and PSA Will facilitate F/U with DR Isabel Caprice

## 2012-03-28 NOTE — Assessment & Plan Note (Signed)
Stable with no angina and good activity level.  Continue medical Rx  

## 2012-03-28 NOTE — Patient Instructions (Addendum)
Your physician wants you to follow-up in:  6 MONTHS WITH DR Haywood Filler will receive a reminder letter in the mail two months in advance. If you don't receive a letter, please call our office to schedule the follow-up appointment. Your physician recommends that you return for lab work in: U/A  PSA  DX HEMATURIA

## 2012-03-28 NOTE — Assessment & Plan Note (Signed)
Maint NSR continue anticoagulation with xarelto 

## 2012-03-28 NOTE — Assessment & Plan Note (Signed)
Well controlled.  Continue current medications and low sodium Dash type diet.    

## 2012-03-29 ENCOUNTER — Telehealth: Payer: Self-pay | Admitting: Cardiovascular Disease

## 2012-03-29 NOTE — Telephone Encounter (Signed)
Pt' wife rtn a call from [place], listened to the message 5 times and still couldn't understand the persons name, and I didn't see a message to her from our office, pls call [phone]

## 2012-03-29 NOTE — Telephone Encounter (Signed)
SEE LAB  RESULTS NOTE ./CY 

## 2012-04-01 DIAGNOSIS — R3129 Other microscopic hematuria: Secondary | ICD-10-CM | POA: Diagnosis not present

## 2012-04-01 DIAGNOSIS — N401 Enlarged prostate with lower urinary tract symptoms: Secondary | ICD-10-CM | POA: Diagnosis not present

## 2012-04-01 DIAGNOSIS — N138 Other obstructive and reflux uropathy: Secondary | ICD-10-CM | POA: Diagnosis not present

## 2012-04-01 DIAGNOSIS — N200 Calculus of kidney: Secondary | ICD-10-CM | POA: Diagnosis not present

## 2012-04-08 ENCOUNTER — Encounter: Payer: Self-pay | Admitting: Cardiovascular Disease

## 2012-04-09 ENCOUNTER — Encounter: Payer: Self-pay | Admitting: Cardiovascular Disease

## 2012-04-09 ENCOUNTER — Encounter: Payer: Self-pay | Admitting: Internal Medicine

## 2012-04-09 ENCOUNTER — Ambulatory Visit (INDEPENDENT_AMBULATORY_CARE_PROVIDER_SITE_OTHER): Payer: Medicare Other | Admitting: Internal Medicine

## 2012-04-09 VITALS — BP 136/72 | HR 72 | Temp 98.2°F | Resp 16 | Ht 73.0 in | Wt 214.0 lb

## 2012-04-09 DIAGNOSIS — R3129 Other microscopic hematuria: Secondary | ICD-10-CM | POA: Diagnosis not present

## 2012-04-09 DIAGNOSIS — I4891 Unspecified atrial fibrillation: Secondary | ICD-10-CM

## 2012-04-09 DIAGNOSIS — I10 Essential (primary) hypertension: Secondary | ICD-10-CM

## 2012-04-09 DIAGNOSIS — R319 Hematuria, unspecified: Secondary | ICD-10-CM

## 2012-04-09 NOTE — Patient Instructions (Signed)
Patient remains on xaraltofor chronic anticoagulation for atrial fibrillation.  If however  he needs a cystoscopy with potential biopsy we would stop  The xaralto prior to that procedure

## 2012-04-09 NOTE — Progress Notes (Signed)
Subjective:    Patient ID: Sean Lane, male    DOB: May 23, 1928, 78 y.o.   MRN: [id number]  HPI    Review of Systems  Constitutional: Negative.  Negative for fever and fatigue.  HENT: Negative.  Negative for hearing loss, congestion, neck pain and postnasal drip.   Eyes: Negative.  Negative for discharge, redness and visual disturbance.  Respiratory: Negative.  Negative for cough, shortness of breath and wheezing.   Cardiovascular: Negative.  Negative for leg swelling.  Gastrointestinal: Negative.  Negative for abdominal pain, constipation and abdominal distention.  Genitourinary: Positive for hematuria and flank pain. Negative for urgency and frequency.  Musculoskeletal: Negative for joint swelling and arthralgias.  Skin: Negative for color change and rash.  Neurological: Negative.  Negative for weakness and light-headedness.  Hematological: Negative for adenopathy.  Psychiatric/Behavioral: Negative for behavioral problems.   Past Medical History  Diagnosis Date  . Coronary atherosclerosis of unspecified type of vessel, native or graft   . Hypertension   . Hyperlipidemia   . Ulcer   . Diverticulosis of colon (without mention of hemorrhage)   . GERD (gastroesophageal reflux disease)   . Dizziness and giddiness   . Conductive hearing loss, external ear   . Osteoarthrosis, unspecified whether generalized or localized, hand   . Family history of ischemic heart disease   . Personal history of urinary calculi   . Headache   . Osteoarthrosis, unspecified whether generalized or localized, unspecified site   . Allergy   . Atrial flutter   . Hemorrhoids   . Renal calculi     History   Social History  . Marital Status: Married    Spouse Name: N/A    Number of Children: 1  . Years of Education: N/A   Occupational History  .     Social History Main Topics  . Smoking status: Former Smoker    Quit date: 09/11/1961  . Smokeless tobacco: Not on file  . Alcohol Use:  No  . Drug Use: No  . Sexually Active: Yes   Other Topics Concern  . Not on file   Social History Narrative  . No narrative on file    Past Surgical History  Procedure Date  . Total knee arthroplasty   . Cataract extraction 2005  . Foot surgery 1949  . Vasectomy 1971  . Kidney stone surgery 1995    Family History  Problem Relation Age of Onset  . Heart disease Father   . Hypertension Mother   . Arthritis Mother   . Diabetes Other     runs in family  . Stroke Other     Allergies  Allergen Reactions  . Hydromorphone Hcl     REACTION: reaction not listed  . Sulfonamide Derivatives     REACTION: reaction not given    Current Outpatient Prescriptions on File Prior to Visit  Medication Sig Dispense Refill  . aspirin EC 81 MG tablet Take 1 tablet (81 mg total) by mouth daily.      Marland Kitchen atorvastatin (LIPITOR) 10 MG tablet Take 1 tablet (10 mg total) by mouth daily.  90 tablet  3  . BENICAR HCT 20-12.5 MG per tablet Take 1 tablet by mouth Daily.      . carvedilol (COREG) 3.125 MG tablet Take 1 tablet (3.125 mg total) by mouth 2 (two) times daily with a meal.  60 tablet  3  . citalopram (CELEXA) 10 MG tablet Take 1 tablet (10 mg total) by mouth as needed.  90 tablet  3  . dutasteride (AVODART) 0.5 MG capsule Take 1 capsule (0.5 mg total) by mouth daily.  90 capsule  3  . fish oil-omega-3 fatty acids 1000 MG capsule Take 4 g by mouth daily.       . halobetasol (ULTRAVATE) 0.05 % cream Apply topically 2 (two) times daily.  50 g  3  . loratadine (CLARITIN) 10 MG tablet Take 10 mg by mouth daily as needed.      . multivitamin (THERAGRAN) per tablet Take 1 tablet by mouth daily.        . nitroGLYCERIN (NITROSTAT) 0.4 MG SL tablet Place 1 tablet (0.4 mg total) under the tongue every 5 (five) minutes as needed for chest pain.  25 tablet  3  . ranitidine (ZANTAC) 150 MG tablet Take 1 tablet (150 mg total) by mouth daily.  90 tablet  3  . Rivaroxaban (XARELTO) 20 MG TABS Take 1 tablet  (20 mg total) by mouth daily with supper.  30 tablet  11  . SUMAtriptan (IMITREX) 25 MG tablet Take 1 tablet (25 mg total) by mouth every 2 (two) hours as needed.  30 tablet  3   Current Facility-Administered Medications on File Prior to Visit  Medication Dose Route Frequency Provider Last Rate Last Dose  . rivaroxaban (XARELTO) tablet 20 mg  20 mg Oral Daily Wendall Stade, MD        BP 136/72  Pulse 72  Temp 98.2 F (36.8 C)  Resp 16  Ht 6\' 1"  (1.854 m)  Wt 214 lb (97.07 kg)  BMI 28.23 kg/m2       Objective:   Physical Exam  Nursing note and vitals reviewed. Constitutional: He is oriented to person, place, and time.  Abdominal: Soft. Bowel sounds are normal.  Musculoskeletal: Normal range of motion.  Neurological: He is alert and oriented to person, place, and time.  Skin: Skin is warm and dry.          Assessment & Plan:

## 2012-04-12 ENCOUNTER — Ambulatory Visit: Payer: Self-pay | Admitting: Pharmacist

## 2012-04-12 DIAGNOSIS — Z7901 Long term (current) use of anticoagulants: Secondary | ICD-10-CM

## 2012-04-12 DIAGNOSIS — I4892 Unspecified atrial flutter: Secondary | ICD-10-CM

## 2012-05-28 ENCOUNTER — Encounter: Payer: Self-pay | Admitting: Internal Medicine

## 2012-05-28 ENCOUNTER — Ambulatory Visit (INDEPENDENT_AMBULATORY_CARE_PROVIDER_SITE_OTHER): Payer: Medicare Other | Admitting: Internal Medicine

## 2012-05-28 VITALS — BP 144/80 | HR 76 | Temp 98.2°F | Resp 16 | Ht 73.0 in | Wt 214.0 lb

## 2012-05-28 DIAGNOSIS — B49 Unspecified mycosis: Secondary | ICD-10-CM

## 2012-05-28 DIAGNOSIS — I4892 Unspecified atrial flutter: Secondary | ICD-10-CM

## 2012-05-28 DIAGNOSIS — I10 Essential (primary) hypertension: Secondary | ICD-10-CM

## 2012-05-28 DIAGNOSIS — I2510 Atherosclerotic heart disease of native coronary artery without angina pectoris: Secondary | ICD-10-CM | POA: Diagnosis not present

## 2012-05-28 DIAGNOSIS — Z23 Encounter for immunization: Secondary | ICD-10-CM

## 2012-05-28 DIAGNOSIS — B369 Superficial mycosis, unspecified: Secondary | ICD-10-CM

## 2012-05-28 MED ORDER — CLOTRIMAZOLE-BETAMETHASONE 1-0.05 % EX CREA: TOPICAL_CREAM | Freq: Two times a day (BID) | CUTANEOUS | Status: DC

## 2012-05-28 MED ORDER — CARVEDILOL 6.25 MG PO TABS: 6.2500 mg | ORAL_TABLET | Freq: Two times a day (BID) | ORAL | Status: DC

## 2012-05-28 NOTE — Patient Instructions (Signed)
Until you run out of the Coreg 3.125 you may take 2 tablets twice a day. When you pick up to 6.25 you only take 1 tablet twice a day

## 2012-05-28 NOTE — Progress Notes (Signed)
Subjective:    Patient ID: Sean Lane, male    DOB: 1927-12-16, 78 y.o.   MRN: [id number]  HPI  Patient is an 78 year old Sean Lane who was treated for hypertension in the setting of atrial fib. We began [place] as experiment control both rate and blood pressure without slow his heart rate to slow since he had symptomatic bradycardia with other beta blockers.  The cord is work to keep his rate stable and his blood pressure is approaching an desirable range.  He also has acute complaint of a rash underneath his arms is erythematous and itches  Review of Systems  Constitutional: Negative for fever and fatigue.  HENT: Negative for hearing loss, congestion, neck pain and postnasal drip.   Eyes: Negative for discharge, redness and visual disturbance.  Respiratory: Negative for cough, shortness of breath and wheezing.   Cardiovascular: Negative for chest pain, palpitations and leg swelling.  Gastrointestinal: Negative for abdominal pain, constipation and abdominal distention.  Genitourinary: Negative for urgency and frequency.  Musculoskeletal: Negative for joint swelling and arthralgias.  Skin: Positive for color change and rash.  Neurological: Negative for weakness and light-headedness.  Hematological: Negative for adenopathy.  Psychiatric/Behavioral: Negative for behavioral problems.   Past Medical History  Diagnosis Date  . Coronary atherosclerosis of unspecified type of vessel, native or graft   . Hypertension   . Hyperlipidemia   . Ulcer   . Diverticulosis of colon (without mention of hemorrhage)   . GERD (gastroesophageal reflux disease)   . Dizziness and giddiness   . Conductive hearing loss, external ear   . Osteoarthrosis, unspecified whether generalized or localized, hand   . Family history of ischemic heart disease   . Personal history of urinary calculi   . Headache   . Osteoarthrosis, unspecified whether generalized or localized, unspecified site   . Allergy     . Atrial flutter   . Hemorrhoids   . Renal calculi     History   Social History  . Marital Status: Married    Spouse Name: N/A    Number of Children: 1  . Years of Education: N/A   Occupational History  .     Social History Main Topics  . Smoking status: Former Smoker    Quit date: 09/11/1961  . Smokeless tobacco: Not on file  . Alcohol Use: No  . Drug Use: No  . Sexually Active: Yes   Other Topics Concern  . Not on file   Social History Narrative  . No narrative on file    Past Surgical History  Procedure Date  . Total knee arthroplasty   . Cataract extraction 2005  . Foot surgery 1949  . Vasectomy 1971  . Kidney stone surgery 1995    Family History  Problem Relation Age of Onset  . Heart disease Father   . Hypertension Mother   . Arthritis Mother   . Diabetes Other     runs in family  . Stroke Other     Allergies  Allergen Reactions  . Hydromorphone Hcl     REACTION: reaction not listed  . Sulfonamide Derivatives     REACTION: reaction not given    Current Outpatient Prescriptions on File Prior to Visit  Medication Sig Dispense Refill  . aspirin EC 81 MG tablet Take 1 tablet (81 mg total) by mouth daily.      Marland Kitchen atorvastatin (LIPITOR) 10 MG tablet Take 1 tablet (10 mg total) by mouth daily.  90 tablet  3  .  citalopram (CELEXA) 10 MG tablet Take 1 tablet (10 mg total) by mouth as needed.  90 tablet  3  . dutasteride (AVODART) 0.5 MG capsule Take 1 capsule (0.5 mg total) by mouth daily.  90 capsule  3  . fish oil-omega-3 fatty acids 1000 MG capsule Take 4 g by mouth daily.       . halobetasol (ULTRAVATE) 0.05 % cream Apply topically 2 (two) times daily.  50 g  3  . loratadine (CLARITIN) 10 MG tablet Take 10 mg by mouth daily as needed.      . multivitamin (THERAGRAN) per tablet Take 1 tablet by mouth daily.        . ranitidine (ZANTAC) 150 MG tablet Take 1 tablet (150 mg total) by mouth daily.  90 tablet  3  . Rivaroxaban (XARELTO) 20 MG TABS Take  1 tablet (20 mg total) by mouth daily with supper.  30 tablet  11  . SUMAtriptan (IMITREX) 25 MG tablet Take 1 tablet (25 mg total) by mouth every 2 (two) hours as needed.  30 tablet  3  . DISCONTD: carvedilol (COREG) 3.125 MG tablet Take 1 tablet (3.125 mg total) by mouth 2 (two) times daily with a meal.  60 tablet  3  . DISCONTD: nitroGLYCERIN (NITROSTAT) 0.4 MG SL tablet Place 1 tablet (0.4 mg total) under the tongue every 5 (five) minutes as needed for chest pain.  25 tablet  3   Current Facility-Administered Medications on File Prior to Visit  Medication Dose Route Frequency Provider Last Rate Last Dose  . rivaroxaban (XARELTO) tablet 20 mg  20 mg Oral Daily Wendall Stade, MD        BP 144/80  Pulse 76  Temp 98.2 F (36.8 C)  Resp 16  Ht 6\' 1"  (1.854 m)  Wt 214 lb (97.07 kg)  BMI 28.23 kg/m2       Objective:   Physical Exam  Nursing note and vitals reviewed. Constitutional: He appears well-developed and well-nourished.  HENT:  Head: Normocephalic and atraumatic.  Eyes: Conjunctivae normal are normal. Pupils are equal, round, and reactive to light.  Neck: Normal range of motion. Neck supple.  Cardiovascular: Normal rate and regular rhythm.   Pulmonary/Chest: Effort normal and breath sounds normal.  Abdominal: Soft. Bowel sounds are normal.  Skin:       Violaceous rash underneath arms bilaterally          Assessment & Plan:  Probable for rash versus atopic dermatitis underneath the arms use Lotrisone cream to site twice a day and monitor.  Titrate Coreg to 6.25 by mouth twice a day and monitor heart rate and blood pressure.  Otherwise we will see him back in a two-month period he will bring with him a record of his blood pressures.

## 2012-05-31 DIAGNOSIS — R3129 Other microscopic hematuria: Secondary | ICD-10-CM | POA: Diagnosis not present

## 2012-05-31 DIAGNOSIS — N401 Enlarged prostate with lower urinary tract symptoms: Secondary | ICD-10-CM | POA: Diagnosis not present

## 2012-05-31 DIAGNOSIS — N200 Calculus of kidney: Secondary | ICD-10-CM | POA: Diagnosis not present

## 2012-05-31 DIAGNOSIS — N138 Other obstructive and reflux uropathy: Secondary | ICD-10-CM | POA: Diagnosis not present

## 2012-06-09 ENCOUNTER — Encounter: Payer: Self-pay | Admitting: Internal Medicine

## 2012-06-10 ENCOUNTER — Telehealth: Payer: Self-pay | Admitting: Family Medicine

## 2012-06-10 MED ORDER — LISINOPRIL 10 MG PO TABS: 10.0000 mg | ORAL_TABLET | Freq: Every day | ORAL | Status: DC

## 2012-06-10 NOTE — Telephone Encounter (Signed)
Per dr Lovell Sheehan- add lisinopril 10 mg qd- wife informed and med sent in

## 2012-06-10 NOTE — Telephone Encounter (Signed)
Pt was here 9/17. BP med Coreg was increased. Wife states it is still not working. BP is still 154/86. running in high 150s even w/new dosage. Early this morning it was 171/84. Wife wonders if going back to the Benicar med would be better. Please advise. Pt going out of town tomorrow. Please call wife.

## 2012-07-02 ENCOUNTER — Other Ambulatory Visit: Payer: Self-pay | Admitting: *Deleted

## 2012-07-02 MED ORDER — HALOBETASOL PROPIONATE 0.05 % EX CREA: TOPICAL_CREAM | Freq: Two times a day (BID) | CUTANEOUS | Status: DC

## 2012-07-25 DIAGNOSIS — N401 Enlarged prostate with lower urinary tract symptoms: Secondary | ICD-10-CM | POA: Diagnosis not present

## 2012-07-25 DIAGNOSIS — R3129 Other microscopic hematuria: Secondary | ICD-10-CM | POA: Diagnosis not present

## 2012-07-25 DIAGNOSIS — N138 Other obstructive and reflux uropathy: Secondary | ICD-10-CM | POA: Diagnosis not present

## 2012-07-29 ENCOUNTER — Other Ambulatory Visit (INDEPENDENT_AMBULATORY_CARE_PROVIDER_SITE_OTHER): Payer: Medicare Other

## 2012-07-29 DIAGNOSIS — R7309 Other abnormal glucose: Secondary | ICD-10-CM | POA: Diagnosis not present

## 2012-07-29 DIAGNOSIS — R739 Hyperglycemia, unspecified: Secondary | ICD-10-CM

## 2012-07-29 LAB — BASIC METABOLIC PANEL WITH GFR
BUN: 21 mg/dL (ref 6–23)
CO2: 28 meq/L (ref 19–32)
Calcium: 9.4 mg/dL (ref 8.4–10.5)
Chloride: 104 meq/L (ref 96–112)
Creatinine, Ser: 0.9 mg/dL (ref 0.4–1.5)
GFR: 83.69 mL/min
Glucose, Bld: 225 mg/dL — ABNORMAL HIGH (ref 70–99)
Potassium: 4.6 meq/L (ref 3.5–5.1)
Sodium: 138 meq/L (ref 135–145)

## 2012-07-29 LAB — HEMOGLOBIN A1C: Hgb A1c MFr Bld: 8.1 % — ABNORMAL HIGH (ref 4.6–6.5)

## 2012-08-06 ENCOUNTER — Other Ambulatory Visit: Payer: Self-pay | Admitting: *Deleted

## 2012-08-06 ENCOUNTER — Telehealth: Payer: Self-pay | Admitting: Internal Medicine

## 2012-08-06 MED ORDER — METFORMIN HCL 500 MG PO TABS: 500.0000 mg | ORAL_TABLET | Freq: Two times a day (BID) | ORAL | Status: DC

## 2012-08-06 NOTE — Telephone Encounter (Signed)
Pts spouse called req to get lab results. Pls call.

## 2012-08-06 NOTE — Telephone Encounter (Signed)
aic 8.4- ov given for 12-3- for plan of action for on set dm

## 2012-08-12 ENCOUNTER — Ambulatory Visit: Payer: Medicare Other | Admitting: Internal Medicine

## 2012-08-13 ENCOUNTER — Encounter: Payer: Self-pay | Admitting: Internal Medicine

## 2012-08-13 ENCOUNTER — Ambulatory Visit (INDEPENDENT_AMBULATORY_CARE_PROVIDER_SITE_OTHER): Payer: Medicare Other | Admitting: Internal Medicine

## 2012-08-13 VITALS — BP 130/70 | HR 72 | Temp 97.2°F | Resp 16 | Ht 73.0 in | Wt 210.0 lb

## 2012-08-13 DIAGNOSIS — I10 Essential (primary) hypertension: Secondary | ICD-10-CM

## 2012-08-13 DIAGNOSIS — E119 Type 2 diabetes mellitus without complications: Secondary | ICD-10-CM

## 2012-08-13 MED ORDER — GLUCOSE BLOOD VI STRP: ORAL_STRIP | Status: DC

## 2012-08-13 NOTE — Patient Instructions (Signed)
The patient is instructed to continue all medications as prescribed. Schedule followup with check out clerk upon leaving the clinic  

## 2012-08-13 NOTE — Progress Notes (Signed)
Subjective:    Patient ID: Sean Lane, male    DOB: 09/25/1927, 78 y.o.   MRN: [id number]  HPI  The patient had glucose in urine. Had noted decline in energy. Hypoglycemia noted A1c was 8.4 Discussed gluten free  Review of Systems  Constitutional: Negative for fever and fatigue.  HENT: Negative for hearing loss, congestion, neck pain and postnasal drip.   Eyes: Negative for discharge, redness and visual disturbance.  Respiratory: Negative for cough, shortness of breath and wheezing.   Cardiovascular: Negative for leg swelling.  Gastrointestinal: Negative for abdominal pain, constipation and abdominal distention.  Genitourinary: Negative for urgency and frequency.  Musculoskeletal: Negative for joint swelling and arthralgias.  Skin: Negative for color change and rash.  Neurological: Negative for weakness and light-headedness.  Hematological: Negative for adenopathy.  Psychiatric/Behavioral: Negative for behavioral problems.   Past Medical History  Diagnosis Date  . Coronary atherosclerosis of unspecified type of vessel, native or graft   . Hypertension   . Hyperlipidemia   . Ulcer   . Diverticulosis of colon (without mention of hemorrhage)   . GERD (gastroesophageal reflux disease)   . Dizziness and giddiness   . Conductive hearing loss, external ear   . Osteoarthrosis, unspecified whether generalized or localized, hand   . Family history of ischemic heart disease   . Personal history of urinary calculi   . Headache   . Osteoarthrosis, unspecified whether generalized or localized, unspecified site   . Allergy   . Atrial flutter   . Hemorrhoids   . Renal calculi     History   Social History  . Marital Status: Married    Spouse Name: N/A    Number of Children: 1  . Years of Education: N/A   Occupational History  .     Social History Main Topics  . Smoking status: Former Smoker    Quit date: 09/11/1961  . Smokeless tobacco: Not on file  . Alcohol Use:  No  . Drug Use: No  . Sexually Active: Yes   Other Topics Concern  . Not on file   Social History Narrative  . No narrative on file    Past Surgical History  Procedure Date  . Total knee arthroplasty   . Cataract extraction 2005  . Foot surgery 1949  . Vasectomy 1971  . Kidney stone surgery 1995    Family History  Problem Relation Age of Onset  . Heart disease Father   . Hypertension Mother   . Arthritis Mother   . Diabetes Other     runs in family  . Stroke Other     Allergies  Allergen Reactions  . Hydromorphone Hcl     REACTION: reaction not listed  . Sulfonamide Derivatives     REACTION: reaction not given    Current Outpatient Prescriptions on File Prior to Visit  Medication Sig Dispense Refill  . aspirin EC 81 MG tablet Take 1 tablet (81 mg total) by mouth daily.      Marland Kitchen atorvastatin (LIPITOR) 10 MG tablet Take 1 tablet (10 mg total) by mouth daily.  90 tablet  3  . carvedilol (COREG) 6.25 MG tablet Take 1 tablet (6.25 mg total) by mouth 2 (two) times daily with a meal.  60 tablet  3  . citalopram (CELEXA) 10 MG tablet Take 1 tablet (10 mg total) by mouth as needed.  90 tablet  3  . clotrimazole-betamethasone (LOTRISONE) cream Apply topically 2 (two) times daily. Until rash resolves  30 g  0  . dutasteride (AVODART) 0.5 MG capsule Take 1 capsule (0.5 mg total) by mouth daily.  90 capsule  3  . fish oil-omega-3 fatty acids 1000 MG capsule Take 4 g by mouth daily.       . halobetasol (ULTRAVATE) 0.05 % cream Apply topically 2 (two) times daily.  50 g  3  . lisinopril (PRINIVIL,ZESTRIL) 10 MG tablet Take 1 tablet (10 mg total) by mouth daily.  90 tablet  3  . loratadine (CLARITIN) 10 MG tablet Take 10 mg by mouth daily as needed.      . metFORMIN (GLUCOPHAGE) 500 MG tablet Take 1 tablet (500 mg total) by mouth 2 (two) times daily with a meal.  60 tablet  3  . multivitamin (THERAGRAN) per tablet Take 1 tablet by mouth daily.        . nitroGLYCERIN (NITROSTAT) 0.4  MG SL tablet Place 0.4 mg under the tongue every 5 (five) minutes as needed.      . ranitidine (ZANTAC) 150 MG tablet Take 1 tablet (150 mg total) by mouth daily.  90 tablet  3  . Rivaroxaban (XARELTO) 20 MG TABS Take 1 tablet (20 mg total) by mouth daily with supper.  30 tablet  11  . SUMAtriptan (IMITREX) 25 MG tablet Take 1 tablet (25 mg total) by mouth every 2 (two) hours as needed.  30 tablet  3   Current Facility-Administered Medications on File Prior to Visit  Medication Dose Route Frequency Provider Last Rate Last Dose  . [DISCONTINUED] rivaroxaban (XARELTO) tablet 20 mg  20 mg Oral Daily Wendall Stade, MD        BP 130/70  Pulse 72  Temp 97.2 F (36.2 C)  Resp 16  Ht 6\' 1"  (1.854 m)  Wt 210 lb (95.255 kg)  BMI 27.71 kg/m2       Objective:   Physical Exam  Nursing note and vitals reviewed. Constitutional: He appears well-developed and well-nourished.  HENT:  Head: Normocephalic and atraumatic.  Eyes: Conjunctivae normal are normal. Pupils are equal, round, and reactive to light.  Neck: Normal range of motion. Neck supple.  Cardiovascular: Normal rate and regular rhythm.   Pulmonary/Chest: Effort normal and breath sounds normal.  Abdominal: Soft. Bowel sounds are normal.          Assessment & Plan:  Gluten free stratigy dicussed Referred to nutrition New diagnosis Dicussed treatment strategy

## 2012-08-16 ENCOUNTER — Other Ambulatory Visit: Payer: Self-pay | Admitting: *Deleted

## 2012-08-16 MED ORDER — FREESTYLE LANCETS MISC: 1.0000 | Freq: Every day | Status: DC

## 2012-08-17 ENCOUNTER — Other Ambulatory Visit: Payer: Self-pay | Admitting: Internal Medicine

## 2012-09-02 ENCOUNTER — Other Ambulatory Visit (INDEPENDENT_AMBULATORY_CARE_PROVIDER_SITE_OTHER): Payer: Medicare Other

## 2012-09-02 DIAGNOSIS — I10 Essential (primary) hypertension: Secondary | ICD-10-CM | POA: Diagnosis not present

## 2012-09-02 DIAGNOSIS — IMO0001 Reserved for inherently not codable concepts without codable children: Secondary | ICD-10-CM | POA: Diagnosis not present

## 2012-09-02 DIAGNOSIS — E119 Type 2 diabetes mellitus without complications: Secondary | ICD-10-CM

## 2012-09-02 LAB — BASIC METABOLIC PANEL
BUN: 20 mg/dL (ref 6–23)
Creatinine, Ser: 1.1 mg/dL (ref 0.4–1.5)
GFR: 66 mL/min (ref >60.00)

## 2012-09-02 LAB — HEMOGLOBIN A1C: Hgb A1c MFr Bld: 7.4 % — ABNORMAL HIGH (ref 4.6–6.5)

## 2012-09-06 ENCOUNTER — Encounter: Payer: Self-pay | Admitting: *Deleted

## 2012-09-06 ENCOUNTER — Encounter: Payer: Medicare Other | Attending: Internal Medicine | Admitting: *Deleted

## 2012-09-06 VITALS — Ht 73.0 in | Wt 199.8 lb

## 2012-09-06 DIAGNOSIS — Z713 Dietary counseling and surveillance: Secondary | ICD-10-CM | POA: Diagnosis not present

## 2012-09-06 DIAGNOSIS — E119 Type 2 diabetes mellitus without complications: Secondary | ICD-10-CM | POA: Diagnosis not present

## 2012-09-06 NOTE — Progress Notes (Signed)
  Medical Nutrition Therapy:  Appt start time: 0945 end time:  1045.  Assessment:  Primary concerns today: patient here for diabetes education and weight loss. Retired Art gallery manager and enjoys Scientist, forensic, genealogy, and church volunteering.  Was diagnosed with diabetes a few weeks ago. Is here with his wife who appears very supportive. He has already lost 10 pounds in the past month since he was diagnosed with a goal of 20 pounds. He SMBG 1-2 times daily alternating between pre and post meals with results typically within 108-132 mg/dl.  MEDICATIONS: see list   DIETARY INTAKE:  Usual eating pattern includes 3 meals and 0 snacks per day.  Everyday foods include good variety of all food groups.  Avoided foods include high fat and high sugar foods.    24-hr recall:  B ( AM): Raisin Bran cereal with 1% milk Snk ( AM): not lately  L ( PM): omelet on Sundays with bacon OR sandwich with ham and cheese or PNB, occasionally fruit or almonds, diet coke or unsweet tea or water Snk ( PM): not lately unless some raw carrots or celery D ( PM): wife prepares meal- small portion of lean meat, vegetables and salad,  diet coke or unsweet tea or water Snk ( PM): none or raw veggies Beverages:  diet coke or unsweet tea or water  Usual physical activity: works out 3 days a week at gym: 30 minutes cardio and then weights.  Estimated energy needs: 1600 calories 180 g carbohydrates 120 g protein 44 g fat  Progress Towards Goal(s):  In progress.   Nutritional Diagnosis:  NB-1.1 Food and nutrition-related knowledge deficit As related to new diagnosis of diabetes.  As evidenced by A1c at diagnosis of 8.1% on 07/29/2012.    Intervention:  Nutrition counseling and diabetes education initiated.Discussed basic physiology of diabetes, SMBG and rationale of checking BG at alternate times of day, A1c, Carb Counting and reading food labels, and benefits of increased activity. Both patient and his wife  expressed good understanding of info taught.   Plan:  Aim for 3 - 4 Carb Choices (45-60 grams) per meal Aim for 0-2 Carbs per snack if hungry Continue checking your BG daily alternating between pre and post meals Continue with your current activity plan 3 days a week as tolerated  Continue taking Metformin with food as directed by your MD  Handouts given during visit include: Diabetes and You by [employer] Carb Counting and Food Label handouts Meal Plan Card Sample Menu Planner  Monitoring/Evaluation:  Dietary intake, exercise, reading food labels, and body weight prn. Patient chooses to call if he has any questions in the future.

## 2012-09-06 NOTE — Patient Instructions (Signed)
Plan:  Aim for 3 - 4 Carb Choices (45-60 grams) per meal Aim for 0-2 Carbs per snack if hungry Continue checking your BG daily alternating between pre and post meals Continue with your current activity plan 3 days a week as tolerated  Continue taking Metformin with food as directed by your MD

## 2012-09-12 ENCOUNTER — Ambulatory Visit: Payer: Medicare Other | Admitting: Internal Medicine

## 2012-09-20 ENCOUNTER — Other Ambulatory Visit: Payer: Self-pay | Admitting: *Deleted

## 2012-09-20 ENCOUNTER — Other Ambulatory Visit: Payer: Self-pay | Admitting: Internal Medicine

## 2012-09-20 MED ORDER — CARVEDILOL 6.25 MG PO TABS: 3.1250 mg | ORAL_TABLET | Freq: Two times a day (BID) | ORAL | Status: DC

## 2012-09-24 ENCOUNTER — Ambulatory Visit (INDEPENDENT_AMBULATORY_CARE_PROVIDER_SITE_OTHER): Payer: Medicare Other | Admitting: Cardiovascular Disease

## 2012-09-24 VITALS — BP 107/67 | HR 80 | Ht 73.0 in | Wt 197.0 lb

## 2012-09-24 DIAGNOSIS — I10 Essential (primary) hypertension: Secondary | ICD-10-CM | POA: Diagnosis not present

## 2012-09-24 DIAGNOSIS — E785 Hyperlipidemia, unspecified: Secondary | ICD-10-CM

## 2012-09-24 DIAGNOSIS — I4892 Unspecified atrial flutter: Secondary | ICD-10-CM

## 2012-09-24 DIAGNOSIS — I2510 Atherosclerotic heart disease of native coronary artery without angina pectoris: Secondary | ICD-10-CM | POA: Diagnosis not present

## 2012-09-24 DIAGNOSIS — I495 Sick sinus syndrome: Secondary | ICD-10-CM | POA: Diagnosis not present

## 2012-09-24 NOTE — Assessment & Plan Note (Signed)
Stable with no angina and good activity level.  Continue medical Rx  

## 2012-09-24 NOTE — Assessment & Plan Note (Signed)
Running low due to DM and weight loss Decrease lisinopril to 5 mg

## 2012-09-24 NOTE — Assessment & Plan Note (Signed)
Maint NSR continue xarelto hematuria resolved

## 2012-09-24 NOTE — Patient Instructions (Signed)
Your physician wants you to follow-up in:   3 MONTHS WITH DR Haywood Filler will receive a reminder letter in the mail two months in advance. If you don't receive a letter, please call our office to schedule the follow-up appointment. Your physician has recommended you make the following change in your medication: DECREASE LISINOPRIL TO 5 MG EVERY DAY

## 2012-09-24 NOTE — Assessment & Plan Note (Signed)
Cholesterol is at goal.  Continue current dose of statin and diet Rx.  No myalgias or side effects.  F/U  LFT's in 6 months. Lab Results  Component Value Date   LDLCALC 74 01/17/2011

## 2012-09-24 NOTE — Assessment & Plan Note (Signed)
Stable with no high grade AV block and maint. NSR.  Continue low dose beta blocker.  ECG in 3 months

## 2012-09-24 NOTE — Progress Notes (Signed)
Patient ID: MATTSON DAYAL, male   DOB: 1928/05/30, 79 y.o.   MRN: [id number] Mr. Gortney returns today for followup. He is a very pleasant 79 year old man with a history of atrial flutter, hypertension, and sinus bradycardia. The patient has maintaining sinus rhythm since last February. I had initially seen him in January and he was in atrial flutter with a controlled ventricular response. He spontaneously reverted to sinus rhythm. He was placed on anticoagulation with Coumadin. He denies chest pain or shortness of breath. His only complaint today is that he misses not being able to exercise. I asked him why he was not exercising and he was not certain but thought he has not been supposed to. No syncope and no peripheral edema. No chest pain.  Changed to xarelto end of June having microhematuria Sees Grapey for prostate  He has a history of CAD, s/p PCI to CFX in 2009 with Cypher DES x2, hypertension, hyperlipidemia, right bundle branch block, GERD, BPH. LHC 3/09: Mid RCA 50%, left main 20%, proximal LAD 40-50%, mid LAD 40%, proximal circumflex 80% and an 80% after OM2, left PDA 40%, EF 60%. PCI: Cypher DES x2 to the proximal and mid circumflex. Monitor in 2011 demonstrated NSR, PACs, right bundle branch block, no long pauses, no SVT, no high-grade heart block. Last exercise treadmill test 1/12: Heart rate increased appropriately, no ischemic EKG changes. Last seen by me 77/13  Since then has lost weight and developed DM.  On meds now and trying to eat low carb diet  Dizzyness somewhat postural  ROS: Denies fever, malais, weight loss, blurry vision, decreased visual acuity, cough, sputum, SOB, hemoptysis, pleuritic pain, palpitaitons, heartburn, abdominal pain, melena, lower extremity edema, claudication, or rash.  All other systems reviewed and negative  General: Affect appropriate Healthy:  appears stated age HEENT: normal Neck supple with no adenopathy JVP normal no bruits no thyromegaly Lungs  clear with no wheezing and good diaphragmatic motion Heart:  S1/S2 no murmur, no rub, gallop or click PMI normal Abdomen: benighn, BS positve, no tenderness, no AAA no bruit.  No HSM or HJR Distal pulses intact with no bruits No edema Neuro non-focal Skin warm and dry No muscular weakness   Current Outpatient Prescriptions  Medication Sig Dispense Refill  . aspirin EC 81 MG tablet Take 1 tablet (81 mg total) by mouth daily.      Marland Kitchen atorvastatin (LIPITOR) 10 MG tablet Take 1 tablet (10 mg total) by mouth daily.  90 tablet  3  . AVODART 0.5 MG capsule TAKE 1 CAPSULE DAILY  90 capsule  3  . carvedilol (COREG) 6.25 MG tablet Take 0.5 tablets (3.125 mg total) by mouth 2 (two) times daily with a meal.  30 tablet  3  . citalopram (CELEXA) 10 MG tablet Take 1 tablet (10 mg total) by mouth as needed.  90 tablet  3  . clotrimazole-betamethasone (LOTRISONE) cream Apply topically 2 (two) times daily. Until rash resolves  30 g  0  . fish oil-omega-3 fatty acids 1000 MG capsule Take 4 g by mouth daily.       Marland Kitchen glucose blood (FREESTYLE INSULINX TEST) test strip Use as instructed  100 each  12  . halobetasol (ULTRAVATE) 0.05 % cream Apply topically 2 (two) times daily.  50 g  3  . Lancets (FREESTYLE) lancets 1 each by Other route daily. Use as instructed  100 each  12  . lisinopril (PRINIVIL,ZESTRIL) 10 MG tablet Take 1 tablet (10 mg total) by  mouth daily.  90 tablet  3  . loratadine (CLARITIN) 10 MG tablet Take 10 mg by mouth daily as needed.      . metFORMIN (GLUCOPHAGE) 500 MG tablet Take 1 tablet (500 mg total) by mouth 2 (two) times daily with a meal.  60 tablet  3  . multivitamin (THERAGRAN) per tablet Take 1 tablet by mouth daily.        . nitroGLYCERIN (NITROSTAT) 0.4 MG SL tablet Place 0.4 mg under the tongue every 5 (five) minutes as needed.      . ranitidine (ZANTAC) 150 MG tablet Take 1 tablet (150 mg total) by mouth daily.  90 tablet  3  . Rivaroxaban (XARELTO) 20 MG TABS Take 1 tablet (20  mg total) by mouth daily with supper.  30 tablet  11  . SUMAtriptan (IMITREX) 25 MG tablet Take 1 tablet (25 mg total) by mouth every 2 (two) hours as needed.  30 tablet  3  . Tamsulosin HCl (FLOMAX) 0.4 MG CAPS Take 0.4 mg by mouth daily.        Allergies  Hydromorphone hcl and Sulfonamide derivatives  Electrocardiogram:  03/05/12  SR rate 58 PR 200 RBBB inferior T wave inversions  Assessment and Plan

## 2012-09-25 ENCOUNTER — Other Ambulatory Visit: Payer: Self-pay | Admitting: Internal Medicine

## 2012-10-09 ENCOUNTER — Ambulatory Visit: Payer: Medicare Other | Admitting: Internal Medicine

## 2012-10-09 ENCOUNTER — Ambulatory Visit (INDEPENDENT_AMBULATORY_CARE_PROVIDER_SITE_OTHER): Payer: Medicare Other | Admitting: Internal Medicine

## 2012-10-09 ENCOUNTER — Encounter: Payer: Self-pay | Admitting: Internal Medicine

## 2012-10-09 VITALS — BP 122/70 | HR 52 | Temp 97.7°F | Resp 16 | Ht 73.0 in | Wt 195.0 lb

## 2012-10-09 DIAGNOSIS — IMO0001 Reserved for inherently not codable concepts without codable children: Secondary | ICD-10-CM

## 2012-10-09 DIAGNOSIS — E785 Hyperlipidemia, unspecified: Secondary | ICD-10-CM

## 2012-10-09 DIAGNOSIS — I4892 Unspecified atrial flutter: Secondary | ICD-10-CM

## 2012-10-09 DIAGNOSIS — I10 Essential (primary) hypertension: Secondary | ICD-10-CM

## 2012-10-09 NOTE — Progress Notes (Signed)
Subjective:    Patient ID: Sean Lane, male    DOB: Feb 18, 1928, 79 y.o.   MRN: [id number]  HPI Has lost 18 lbs since December blood pressure control is much better Has done this through caloric restriction Seeing cardiology for AF and was noted to be hypotensive ( orthostatic) and the coreg and the lisinopril has been cut    Review of Systems  Constitutional: Negative for fever and fatigue.  HENT: Negative for hearing loss, congestion, neck pain and postnasal drip.   Eyes: Negative for discharge, redness and visual disturbance.  Respiratory: Negative for cough, shortness of breath and wheezing.   Cardiovascular: Negative for leg swelling.  Gastrointestinal: Negative for abdominal pain, constipation and abdominal distention.  Genitourinary: Negative for urgency and frequency.  Musculoskeletal: Negative for joint swelling and arthralgias.  Skin: Negative for color change and rash.  Neurological: Negative for weakness and light-headedness.  Hematological: Negative for adenopathy.  Psychiatric/Behavioral: Negative for behavioral problems.   Past Medical History  Diagnosis Date  . Coronary atherosclerosis of unspecified type of vessel, native or graft   . Hypertension   . Hyperlipidemia   . Ulcer   . Diverticulosis of colon (without mention of hemorrhage)   . GERD (gastroesophageal reflux disease)   . Dizziness and giddiness   . Conductive hearing loss, external ear   . Osteoarthrosis, unspecified whether generalized or localized, hand   . Family history of ischemic heart disease   . Personal history of urinary calculi   . Headache   . Osteoarthrosis, unspecified whether generalized or localized, unspecified site   . Allergy   . Atrial flutter   . Hemorrhoids   . Renal calculi   . Diabetes mellitus without complication     History   Social History  . Marital Status: Married    Spouse Name: N/A    Number of Children: 1  . Years of Education: N/A    Occupational History  .     Social History Main Topics  . Smoking status: Former Smoker    Quit date: 09/11/1961  . Smokeless tobacco: Never Used  . Alcohol Use: No  . Drug Use: No  . Sexually Active: Yes   Other Topics Concern  . Not on file   Social History Narrative  . No narrative on file    Past Surgical History  Procedure Date  . Total knee arthroplasty   . Cataract extraction 2005  . Foot surgery 1949  . Vasectomy 1971  . Kidney stone surgery 1995    Family History  Problem Relation Age of Onset  . Heart disease Father   . Hypertension Mother   . Arthritis Mother   . Diabetes Other     runs in family  . Stroke Other     Allergies  Allergen Reactions  . Hydromorphone Hcl     REACTION: reaction not listed  . Sulfonamide Derivatives     REACTION: reaction not given    Current Outpatient Prescriptions on File Prior to Visit  Medication Sig Dispense Refill  . aspirin EC 81 MG tablet Take 1 tablet (81 mg total) by mouth daily.      Marland Kitchen atorvastatin (LIPITOR) 10 MG tablet Take 1 tablet (10 mg total) by mouth daily.  90 tablet  3  . AVODART 0.5 MG capsule TAKE 1 CAPSULE DAILY  90 capsule  3  . carvedilol (COREG) 6.25 MG tablet Take 0.5 tablets (3.125 mg total) by mouth 2 (two) times daily with a meal.  30 tablet  3  . citalopram (CELEXA) 10 MG tablet Take 1 tablet (10 mg total) by mouth as needed.  90 tablet  3  . clotrimazole-betamethasone (LOTRISONE) cream Apply topically 2 (two) times daily. Until rash resolves  30 g  0  . fish oil-omega-3 fatty acids 1000 MG capsule Take 2 capsules by mouth daily.       Marland Kitchen glucose blood (FREESTYLE INSULINX TEST) test strip Use as instructed  100 each  12  . halobetasol (ULTRAVATE) 0.05 % cream Apply topically 2 (two) times daily.  50 g  3  . Lancets (FREESTYLE) lancets 1 each by Other route daily. Use as instructed  100 each  12  . lisinopril (PRINIVIL,ZESTRIL) 10 MG tablet Take 5 mg by mouth daily.      Marland Kitchen loratadine  (CLARITIN) 10 MG tablet Take 10 mg by mouth daily as needed.      . metFORMIN (GLUCOPHAGE) 500 MG tablet Take 1 tablet (500 mg total) by mouth 2 (two) times daily with a meal.  60 tablet  3  . multivitamin (THERAGRAN) per tablet Take 1 tablet by mouth daily.        . nitroGLYCERIN (NITROSTAT) 0.4 MG SL tablet Place 0.4 mg under the tongue every 5 (five) minutes as needed.      . ranitidine (ZANTAC) 150 MG tablet Take 1 tablet (150 mg total) by mouth daily.  90 tablet  3  . Rivaroxaban (XARELTO) 20 MG TABS Take 1 tablet (20 mg total) by mouth daily with supper.  30 tablet  11  . SUMAtriptan (IMITREX) 25 MG tablet TAKE 1 TABLET EVERY 2 HOURSAS NEEDED  27 tablet  3  . Tamsulosin HCl (FLOMAX) 0.4 MG CAPS Take 0.4 mg by mouth daily.        BP 122/70  Pulse 52  Temp 97.7 F (36.5 C)  Resp 16  Ht 6\' 1"  (1.854 m)  Wt 195 lb (88.451 kg)  BMI 25.73 kg/m2       Objective:   Physical Exam  Constitutional: He appears well-developed and well-nourished.  HENT:  Head: Normocephalic and atraumatic.  Eyes: Conjunctivae normal are normal. Pupils are equal, round, and reactive to light.  Neck: Normal range of motion. Neck supple.  Cardiovascular: Normal rate and regular rhythm.   Murmur heard. Pulmonary/Chest: Effort normal and breath sounds normal.  Abdominal: Soft. Bowel sounds are normal.          Assessment & Plan:  CBG reviewed fasting in the 100-110 Random in the 90-100 No reported hypoglycemia Too early for A1c Discussed when to cut the metformin monitering blod pressure

## 2012-10-09 NOTE — Patient Instructions (Signed)
If you have more than 2 hypoglycemic episodes where  your blood sugar drop below 80   between now and the next visit you cut your metformin to 1 a day

## 2012-10-17 DIAGNOSIS — L259 Unspecified contact dermatitis, unspecified cause: Secondary | ICD-10-CM | POA: Diagnosis not present

## 2012-11-22 ENCOUNTER — Other Ambulatory Visit: Payer: Self-pay | Admitting: Internal Medicine

## 2012-12-10 ENCOUNTER — Ambulatory Visit (INDEPENDENT_AMBULATORY_CARE_PROVIDER_SITE_OTHER): Payer: Medicare Other | Admitting: Cardiovascular Disease

## 2012-12-10 ENCOUNTER — Encounter: Payer: Self-pay | Admitting: Cardiovascular Disease

## 2012-12-10 VITALS — BP 118/62 | HR 61 | Ht 73.0 in | Wt 185.0 lb

## 2012-12-10 DIAGNOSIS — E785 Hyperlipidemia, unspecified: Secondary | ICD-10-CM

## 2012-12-10 DIAGNOSIS — I10 Essential (primary) hypertension: Secondary | ICD-10-CM

## 2012-12-10 DIAGNOSIS — I495 Sick sinus syndrome: Secondary | ICD-10-CM

## 2012-12-10 DIAGNOSIS — I4892 Unspecified atrial flutter: Secondary | ICD-10-CM | POA: Diagnosis not present

## 2012-12-10 NOTE — Assessment & Plan Note (Signed)
Well controlled.  Continue current medications and low sodium Dash type diet.   Not postural for me but may need to lower ACE dose if weight loss continues

## 2012-12-10 NOTE — Progress Notes (Signed)
Patient ID: Sean Lane, male   DOB: 1927-09-23, 79 y.o.   MRN: [id number] Sean Lane returns today for followup. He is a very pleasant 79 year old man with a history of atrial flutter, hypertension, and sinus bradycardia. The patient has maintaining sinus rhythm since last February. I had initially seen him in January and he was in atrial flutter with a controlled ventricular response. He spontaneously reverted to sinus rhythm. He was placed on anticoagulation with Coumadin. He denies chest pain or shortness of breath. His only complaint today is that he misses not being able to exercise. I asked him why he was not exercising and he was not certain but thought he has not been supposed to. No syncope and no peripheral edema. No chest pain.  Changed to xarelto end of June having microhematuria Sees Sean Lane for prostate  He has a history of CAD, s/p PCI to CFX in 2009 with Cypher DES x2, hypertension, hyperlipidemia, right bundle branch block, GERD, BPH. LHC 3/09: Mid RCA 50%, left main 20%, proximal LAD 40-50%, mid LAD 40%, proximal circumflex 80% and an 80% after OM2, left PDA 40%, EF 60%. PCI: Cypher DES x2 to the proximal and mid circumflex. Monitor in 2011 demonstrated NSR, PACs, right bundle branch block, no long pauses, no SVT, no high-grade heart block. Last exercise treadmill test 1/12: Heart rate increased appropriately, no ischemic EKG changes. Last seen by me 77/13 Since then has lost weight and developed DM. On meds now and trying to eat low carb diet Dizzyness somewhat postural  ROS: Denies fever, malais, weight loss, blurry vision, decreased visual acuity, cough, sputum, SOB, hemoptysis, pleuritic pain, palpitaitons, heartburn, abdominal pain, melena, lower extremity edema, claudication, or rash.  All other systems reviewed and negative  General: Affect appropriate Healthy:  appears stated age HEENT: normal Neck supple with no adenopathy JVP normal no bruits no thyromegaly Lungs clear  with no wheezing and good diaphragmatic motion Heart:  S1/S2 no murmur, no rub, gallop or click PMI normal Abdomen: benighn, BS positve, no tenderness, no AAA no bruit.  No HSM or HJR Distal pulses intact with no bruits No edema Neuro non-focal Skin warm and dry No muscular weakness   Current Outpatient Prescriptions  Medication Sig Dispense Refill  . aspirin EC 81 MG tablet Take 1 tablet (81 mg total) by mouth daily.      Marland Kitchen atorvastatin (LIPITOR) 10 MG tablet Take 1 tablet (10 mg total) by mouth daily.  90 tablet  3  . AVODART 0.5 MG capsule TAKE 1 CAPSULE DAILY  90 capsule  3  . carvedilol (COREG) 6.25 MG tablet Take 0.5 tablets (3.125 mg total) by mouth 2 (two) times daily with a meal.  30 tablet  3  . citalopram (CELEXA) 10 MG tablet Take 1 tablet (10 mg total) by mouth as needed.  90 tablet  3  . clotrimazole-betamethasone (LOTRISONE) cream Apply topically 2 (two) times daily. Until rash resolves  30 g  0  . fish oil-omega-3 fatty acids 1000 MG capsule Take 2 capsules by mouth daily.       Marland Kitchen glucose blood (FREESTYLE INSULINX TEST) test strip Use as instructed  100 each  12  . halobetasol (ULTRAVATE) 0.05 % cream Apply topically 2 (two) times daily.  50 g  3  . Lancets (FREESTYLE) lancets 1 each by Other route daily. Use as instructed  100 each  12  . lisinopril (PRINIVIL,ZESTRIL) 10 MG tablet Take 5 mg by mouth daily.      Marland Kitchen  loratadine (CLARITIN) 10 MG tablet Take 10 mg by mouth daily as needed.      . metFORMIN (GLUCOPHAGE) 500 MG tablet TAKE ONE TABLET BY MOUTH TWICE DAILY WITH A MEAL.  60 tablet  0  . multivitamin (THERAGRAN) per tablet Take 1 tablet by mouth daily.        . nitroGLYCERIN (NITROSTAT) 0.4 MG SL tablet Place 0.4 mg under the tongue every 5 (five) minutes as needed.      . Rivaroxaban (XARELTO) 20 MG TABS Take 1 tablet (20 mg total) by mouth daily with supper.  30 tablet  11  . SUMAtriptan (IMITREX) 25 MG tablet TAKE 1 TABLET EVERY 2 HOURSAS NEEDED  27 tablet  3   . Tamsulosin HCl (FLOMAX) 0.4 MG CAPS Take 0.4 mg by mouth daily.       No current facility-administered medications for this visit.    Allergies  Hydromorphone hcl and Sulfonamide derivatives 13  Electrocardiogram: 03/05/12  SR rate 58 RBBB   Assessment and Plan

## 2012-12-10 NOTE — Assessment & Plan Note (Signed)
Cholesterol is at goal.  Continue current dose of statin and diet Rx.  No myalgias or side effects.  F/U  LFT's in 6 months. Lab Results  Component Value Date   LDLCALC 74 01/17/2011

## 2012-12-10 NOTE — Assessment & Plan Note (Signed)
Maint NSR  Continue xarelto no bleeding issues

## 2012-12-10 NOTE — Assessment & Plan Note (Signed)
SSS PAF stable no syncope or high grade AV block

## 2012-12-14 ENCOUNTER — Ambulatory Visit (INDEPENDENT_AMBULATORY_CARE_PROVIDER_SITE_OTHER): Payer: Medicare Other | Admitting: Family Medicine

## 2012-12-14 ENCOUNTER — Encounter: Payer: Self-pay | Admitting: Family Medicine

## 2012-12-14 VITALS — BP 150/80 | Temp 97.5°F | Wt 184.0 lb

## 2012-12-14 DIAGNOSIS — M109 Gout, unspecified: Secondary | ICD-10-CM | POA: Diagnosis not present

## 2012-12-14 DIAGNOSIS — I10 Essential (primary) hypertension: Secondary | ICD-10-CM

## 2012-12-14 DIAGNOSIS — E119 Type 2 diabetes mellitus without complications: Secondary | ICD-10-CM

## 2012-12-14 DIAGNOSIS — E1159 Type 2 diabetes mellitus with other circulatory complications: Secondary | ICD-10-CM | POA: Insufficient documentation

## 2012-12-14 HISTORY — DX: Gout, unspecified: M10.9

## 2012-12-14 MED ORDER — COLCHICINE 0.6 MG PO TABS: ORAL_TABLET | ORAL | Status: DC

## 2012-12-14 MED ORDER — METHYLPREDNISOLONE 4 MG PO KIT: PACK | ORAL | Status: DC

## 2012-12-14 NOTE — Assessment & Plan Note (Signed)
Newly diagnosed, encourage dhim to minimize carbs if he needs to take the Medrol dosepak for his gout

## 2012-12-14 NOTE — Progress Notes (Signed)
  Subjective:    Patient ID: Sean Lane, male    DOB: 1928/07/29, 79 y.o.   MRN: [id number]  HPI Patient is an 79 year old Caucasian male who is in today copy by his wife. He is a long-standing problem with pain and swelling in his right hand most notably at the thenar prominence. It had been slightly irritated and bothering him over the last 2 weeks until he started to do a lot of work around the house and with increased use pain swelling redness and discomfort have gotten notably worse. Today it is exquisitely tender to the touch red and swollen. He notes he is similar symptoms in his left great toe at times. Has not tried any medications thus far    Review of Systems Patient denies fevers, chills, malaise, recent illness. Patient denies chest pain, palpitations, shortness of breath Patient denies diarrhea, nausea, vomiting, abdominal pain. Patient denies any trauma or injury to the hand or joints appear      Objective:   Physical Exam BP 150/80. Weight 184 temp 97.5 Gen.: NAD, alert, well-nourished well-developed Caucasian male HEENT: Normocephalic atraumatic conjunctiva pink, slightly dry mucous membranes Cardiovascular regular rate and rhythm Respiratory: No acute distress, clear to auscultation Extremities left knee are prominent swollen, red, and tender to the touch        Assessment & Plan:  Gout attack Painful, swelling, red right thumb joint noted. This joint causes him a car trouble he also has trouble frequently with his left great toe. Symptoms have responded to steroid shots in the past. Suggest gout as a mechanism. Given crutches 80.6 mg one tab every 2 hours until pain relief Max of 6 tabs in 24 hours. Warned about possibility of diarrhea. If purchasing his not helpful is given a Medrol Dosepak to initiate tomorrow. Encouraged good hydration and follow up with PMD.  HYPERTENSION Mild elevation with acute pain, no changes to meds today  Diabetes Newly  diagnosed, encourage dhim to minimize carbs if he needs to take the Medrol dosepak for his gout

## 2012-12-14 NOTE — Assessment & Plan Note (Signed)
Mild elevation with acute pain, no changes to meds today

## 2012-12-14 NOTE — Patient Instructions (Addendum)
Gout Gout is an inflammatory condition (arthritis) caused by a buildup of uric acid crystals in the joints. Uric acid is a chemical that is normally present in the blood. Under some circumstances, uric acid can form into crystals in your joints. This causes joint redness, soreness, and swelling (inflammation). Repeat attacks are common. Over time, uric acid crystals can form into masses (tophi) near a joint, causing disfigurement. Gout is treatable and often preventable. CAUSES  The disease begins with elevated levels of uric acid in the blood. Uric acid is produced by your body when it breaks down a naturally found substance called purines. This also happens when you eat certain foods such as meats and fish. Causes of an elevated uric acid level include:  Being passed down from parent to child (heredity).  Diseases that cause increased uric acid production (obesity, psoriasis, some cancers).  Excessive alcohol use.  Diet, especially diets rich in meat and seafood.  Medicines, including certain cancer-fighting drugs (chemotherapy), diuretics, and aspirin.  Chronic kidney disease. The kidneys are no longer able to remove uric acid well.  Problems with metabolism. Conditions strongly associated with gout include:  Obesity.  High blood pressure.  High cholesterol.  Diabetes. Not everyone with elevated uric acid levels gets gout. It is not understood why some people get gout and others do not. Surgery, joint injury, and eating too much of certain foods are some of the factors that can lead to gout. SYMPTOMS   An attack of gout comes on quickly. It causes intense pain with redness, swelling, and warmth in a joint.  Fever can occur.  Often, only one joint is involved. Certain joints are more commonly involved:  Base of the big toe.  Knee.  Ankle.  Wrist.  Finger. Without treatment, an attack usually goes away in a few days to weeks. Between attacks, you usually will not have  symptoms, which is different from many other forms of arthritis. DIAGNOSIS  Your caregiver will suspect gout based on your symptoms and exam. Removal of fluid from the joint (arthrocentesis) is done to check for uric acid crystals. Your caregiver will give you a medicine that numbs the area (local anesthetic) and use a needle to remove joint fluid for exam. Gout is confirmed when uric acid crystals are seen in joint fluid, using a special microscope. Sometimes, blood, urine, and X-ray tests are also used. TREATMENT  There are 2 phases to gout treatment: treating the sudden onset (acute) attack and preventing attacks (prophylaxis). Treatment of an Acute Attack  Medicines are used. These include anti-inflammatory medicines or steroid medicines.  An injection of steroid medicine into the affected joint is sometimes necessary.  The painful joint is rested. Movement can worsen the arthritis.  You may use warm or cold treatments on painful joints, depending which works best for you.  Discuss the use of coffee, vitamin C, or cherries with your caregiver. These may be helpful treatment options. Treatment to Prevent Attacks After the acute attack subsides, your caregiver may advise prophylactic medicine. These medicines either help your kidneys eliminate uric acid from your body or decrease your uric acid production. You may need to stay on these medicines for a very long time. The early phase of treatment with prophylactic medicine can be associated with an increase in acute gout attacks. For this reason, during the first few months of treatment, your caregiver may also advise you to take medicines usually used for acute gout treatment. Be sure you understand your caregiver's directions.   You should also discuss dietary treatment with your caregiver. Certain foods such as meats and fish can increase uric acid levels. Other foods such as dairy can decrease levels. Your caregiver can give you a list of foods  to avoid. HOME CARE INSTRUCTIONS   Do not take aspirin to relieve pain. This raises uric acid levels.  Only take over-the-counter or prescription medicines for pain, discomfort, or fever as directed by your caregiver.  Rest the joint as much as possible. When in bed, keep sheets and blankets off painful areas.  Keep the affected joint raised (elevated).  Use crutches if the painful joint is in your leg.  Drink enough water and fluids to keep your urine clear or pale yellow. This helps your body get rid of uric acid. Do not drink alcoholic beverages. They slow the passage of uric acid.  Follow your caregiver's dietary instructions. Pay careful attention to the amount of protein you eat. Your daily diet should emphasize fruits, vegetables, whole grains, and fat-free or low-fat milk products.  Maintain a healthy body weight. SEEK MEDICAL CARE IF:   You have an oral temperature above 102 F (38.9 C).  You develop diarrhea, vomiting, or any side effects from medicines.  You do not feel better in 24 hours, or you are getting worse. SEEK IMMEDIATE MEDICAL CARE IF:   Your joint becomes suddenly more tender and you have:  Chills.  An oral temperature above 102 F (38.9 C), not controlled by medicine. MAKE SURE YOU:   Understand these instructions.  Will watch your condition.  Will get help right away if you are not doing well or get worse. Document Released: 08/25/2000 Document Revised: 11/20/2011 Document Reviewed: 12/06/2009 [employer] Patient Information 2013 [employer].    

## 2012-12-14 NOTE — Assessment & Plan Note (Signed)
Painful, swelling, red right thumb joint noted. This joint causes him a car trouble he also has trouble frequently with his left great toe. Symptoms have responded to steroid shots in the past. Suggest gout as a mechanism. Given crutches 80.6 mg one tab every 2 hours until pain relief Max of 6 tabs in 24 hours. Warned about possibility of diarrhea. If purchasing his not helpful is given a Medrol Dosepak to initiate tomorrow. Encouraged good hydration and follow up with PMD.

## 2012-12-23 ENCOUNTER — Other Ambulatory Visit (INDEPENDENT_AMBULATORY_CARE_PROVIDER_SITE_OTHER): Payer: Medicare Other

## 2012-12-23 DIAGNOSIS — I10 Essential (primary) hypertension: Secondary | ICD-10-CM | POA: Diagnosis not present

## 2012-12-23 DIAGNOSIS — IMO0001 Reserved for inherently not codable concepts without codable children: Secondary | ICD-10-CM | POA: Diagnosis not present

## 2012-12-23 DIAGNOSIS — E785 Hyperlipidemia, unspecified: Secondary | ICD-10-CM

## 2012-12-23 LAB — LIPID PANEL
LDL Cholesterol: 62 mg/dL (ref 0–99)
VLDL: 18.8 mg/dL (ref 0.0–40.0)

## 2012-12-23 LAB — BASIC METABOLIC PANEL
Calcium: 9.9 mg/dL (ref 8.4–10.5)
GFR: 88.01 mL/min (ref >60.00)
Potassium: 4.2 mEq/L (ref 3.5–5.1)
Sodium: 141 mEq/L (ref 135–145)

## 2012-12-24 ENCOUNTER — Other Ambulatory Visit: Payer: Self-pay | Admitting: *Deleted

## 2012-12-24 MED ORDER — LISINOPRIL 5 MG PO TABS: 5.0000 mg | ORAL_TABLET | Freq: Every day | ORAL | Status: DC

## 2012-12-30 ENCOUNTER — Ambulatory Visit (INDEPENDENT_AMBULATORY_CARE_PROVIDER_SITE_OTHER): Payer: Medicare Other | Admitting: Internal Medicine

## 2012-12-30 ENCOUNTER — Encounter: Payer: Self-pay | Admitting: Internal Medicine

## 2012-12-30 VITALS — BP 126/80 | HR 72 | Temp 98.6°F | Resp 16 | Ht 73.0 in | Wt 180.0 lb

## 2012-12-30 DIAGNOSIS — E119 Type 2 diabetes mellitus without complications: Secondary | ICD-10-CM | POA: Diagnosis not present

## 2012-12-30 DIAGNOSIS — I10 Essential (primary) hypertension: Secondary | ICD-10-CM

## 2012-12-30 DIAGNOSIS — IMO0001 Reserved for inherently not codable concepts without codable children: Secondary | ICD-10-CM | POA: Diagnosis not present

## 2012-12-30 DIAGNOSIS — I4891 Unspecified atrial fibrillation: Secondary | ICD-10-CM | POA: Diagnosis not present

## 2012-12-30 MED ORDER — METFORMIN HCL ER 500 MG PO TB24: 500.0000 mg | ORAL_TABLET | Freq: Every day | ORAL | Status: DC

## 2012-12-30 MED ORDER — GLUCOSE BLOOD VI STRP: 1.0000 | ORAL_STRIP | Freq: Every day | Status: DC

## 2012-12-30 NOTE — Progress Notes (Signed)
  Subjective:    Patient ID: Sean Lane, male    DOB: May 09, 1928, 79 y.o.   MRN: [id number]  HPI    Review of Systems  Constitutional: Negative for fever and fatigue.  HENT: Negative for hearing loss, congestion, neck pain and postnasal drip.   Eyes: Negative for discharge, redness and visual disturbance.  Respiratory: Negative for cough, shortness of breath and wheezing.   Cardiovascular: Negative for leg swelling.  Gastrointestinal: Negative for abdominal pain, constipation and abdominal distention.  Genitourinary: Negative for urgency and frequency.  Musculoskeletal: Negative for joint swelling and arthralgias.  Skin: Negative for color change and rash.  Neurological: Negative for weakness and light-headedness.  Hematological: Negative for adenopathy.  Psychiatric/Behavioral: Negative for behavioral problems.       Objective:   Physical Exam  Constitutional: He appears well-developed and well-nourished.  Cardiovascular: Regular rhythm.   Pulmonary/Chest: Effort normal and breath sounds normal.  Abdominal: Soft. Bowel sounds are normal.  Musculoskeletal: Normal range of motion.          Assessment & Plan:  improved DM with good a1c allowing to taper the metformin Stable HTN Great weight loss

## 2012-12-30 NOTE — Patient Instructions (Addendum)
If the CBGS remain stable great if they move up to 150 and stay call

## 2013-01-07 ENCOUNTER — Other Ambulatory Visit: Payer: Self-pay | Admitting: Internal Medicine

## 2013-01-10 ENCOUNTER — Other Ambulatory Visit: Payer: Self-pay | Admitting: *Deleted

## 2013-01-10 MED ORDER — METFORMIN HCL ER 500 MG PO TB24: 500.0000 mg | ORAL_TABLET | Freq: Every day | ORAL | Status: DC

## 2013-01-20 ENCOUNTER — Telehealth: Payer: Self-pay | Admitting: Cardiovascular Disease

## 2013-01-20 NOTE — Telephone Encounter (Signed)
New Prob      Pt BP is dropping and pulse is increasing. Wife is concerned and would like to speak to nurse. Appt scheduled with Tereso Newcomer on 6/3.

## 2013-01-20 NOTE — Telephone Encounter (Signed)
Left message for pt to call.

## 2013-01-21 NOTE — Telephone Encounter (Signed)
LMTCB ./CY 

## 2013-01-22 NOTE — Telephone Encounter (Signed)
LMTCB ./CY 

## 2013-01-28 NOTE — Telephone Encounter (Signed)
Can f/u with me in 3 months

## 2013-01-28 NOTE — Telephone Encounter (Signed)
SPOKE WITH PT'S WIFE PER  WIFE PT IS  DOING MUCH BETTER  WANTED TO CANCEL  6/3 APPT  WITH [facility] WILL BE OUT OF TOWN  ALSO  WANTING TO KNOW WHEN  PT NEEDS TO COME BACK FOR FOLLOW UP WILL FORWARD TO DR Eden Emms FOR  REVIEW .Zack Seal

## 2013-01-29 NOTE — Telephone Encounter (Signed)
SPOKE WITH PT'S WIFE APPT MADE FOR 7.31.14 AT 2:15./CY

## 2013-02-11 ENCOUNTER — Ambulatory Visit: Payer: Medicare Other | Admitting: Physician Assistant

## 2013-04-08 ENCOUNTER — Other Ambulatory Visit: Payer: Self-pay | Admitting: Internal Medicine

## 2013-04-10 ENCOUNTER — Ambulatory Visit (INDEPENDENT_AMBULATORY_CARE_PROVIDER_SITE_OTHER): Payer: Medicare Other | Admitting: Cardiovascular Disease

## 2013-04-10 VITALS — BP 118/70 | HR 62 | Wt 175.0 lb

## 2013-04-10 DIAGNOSIS — E785 Hyperlipidemia, unspecified: Secondary | ICD-10-CM | POA: Diagnosis not present

## 2013-04-10 DIAGNOSIS — I10 Essential (primary) hypertension: Secondary | ICD-10-CM | POA: Diagnosis not present

## 2013-04-10 DIAGNOSIS — I495 Sick sinus syndrome: Secondary | ICD-10-CM | POA: Diagnosis not present

## 2013-04-10 DIAGNOSIS — I4892 Unspecified atrial flutter: Secondary | ICD-10-CM

## 2013-04-10 DIAGNOSIS — I2510 Atherosclerotic heart disease of native coronary artery without angina pectoris: Secondary | ICD-10-CM

## 2013-04-10 NOTE — Assessment & Plan Note (Signed)
ECG and symptoms stable  May need event monitor and or loop recorder in future Some of his dizzyness is postural from weight loss

## 2013-04-10 NOTE — Patient Instructions (Signed)
Your physician wants you to follow-up in:  6 MONTHS WITH DR NISHAN  You will receive a reminder letter in the mail two months in advance. If you don't receive a letter, please call our office to schedule the follow-up appointment. Your physician recommends that you continue on your current medications as directed. Please refer to the Current Medication list given to you today. 

## 2013-04-10 NOTE — Assessment & Plan Note (Signed)
Maint NSR no significant palpitations

## 2013-04-10 NOTE — Assessment & Plan Note (Signed)
Stable with no angina and good activity level.  Continue medical Rx  

## 2013-04-10 NOTE — Assessment & Plan Note (Signed)
Well controlled.  Continue current medications and low sodium Dash type diet.    

## 2013-04-10 NOTE — Progress Notes (Signed)
Patient ID: Sean Lane, male   DOB: 09/08/1928, 79 y.o.   MRN: [id number] Sean Lane returns today for followup. He is a very pleasant 79 year old man with a history of atrial flutter, hypertension, and sinus bradycardia. The patient has maintaining sinus rhythm since last February. I had initially seen him in January and he was in atrial flutter with a controlled ventricular response. He spontaneously reverted to sinus rhythm. He was placed on anticoagulation with Coumadin. He denies chest pain or shortness of breath. His only complaint today is that he misses not being able to exercise. I asked him why he was not exercising and he was not certain but thought he has not been supposed to. No syncope and no peripheral edema. No chest pain.  Changed to xarelto end of June having microhematuria Sees Grapey for prostate  He has a history of CAD, s/p PCI to CFX in 2009 with Cypher DES x2, hypertension, hyperlipidemia, right bundle branch block, GERD, BPH. LHC 3/09: Mid RCA 50%, left main 20%, proximal LAD 40-50%, mid LAD 40%, proximal circumflex 80% and an 80% after OM2, left PDA 40%, EF 60%. PCI: Cypher DES x2 to the proximal and mid circumflex. Monitor in 2011 demonstrated NSR, PACs, right bundle branch block, no long pauses, no SVT, no high-grade heart block. Last exercise treadmill test 1/12: Heart rate increased appropriately, no ischemic EKG changes. Last seen by me 7/13 Since then has lost weight and developed DM. On meds now and trying to eat low carb diet Dizzyness somewhat postural  Discussed future need of pacer. Avoid all AV nodal blocking drugs.  He exercises regularly and has good chronotropic response  ROS: Denies fever, malais, weight loss, blurry vision, decreased visual acuity, cough, sputum, SOB, hemoptysis, pleuritic pain, palpitaitons, heartburn, abdominal pain, melena, lower extremity edema, claudication, or rash.  All other systems reviewed and negative  General: Affect  appropriate Healthy:  appears stated age HEENT: normal Neck supple with no adenopathy JVP normal no bruits no thyromegaly Lungs clear with no wheezing and good diaphragmatic motion Heart:  S1/S2 no murmur, no rub, gallop or click PMI normal Abdomen: benighn, BS positve, no tenderness, no AAA no bruit.  No HSM or HJR Distal pulses intact with no bruits No edema Neuro non-focal Skin warm and dry No muscular weakness   Current Outpatient Prescriptions  Medication Sig Dispense Refill  . aspirin EC 81 MG tablet Take 1 tablet (81 mg total) by mouth daily.      Marland Kitchen atorvastatin (LIPITOR) 10 MG tablet Take 10 mg by mouth daily.      . AVODART 0.5 MG capsule TAKE 1 CAPSULE DAILY  90 capsule  3  . citalopram (CELEXA) 10 MG tablet Take 1 tablet (10 mg total) by mouth as needed.  90 tablet  3  . clotrimazole-betamethasone (LOTRISONE) cream Apply topically 2 (two) times daily. Until rash resolves  30 g  0  . fish oil-omega-3 fatty acids 1000 MG capsule Take 2 capsules by mouth daily.       Marland Kitchen glucose blood (FREESTYLE INSULINX TEST) test strip 1 each by Other route daily. Use as instructed  100 each  12  . halobetasol (ULTRAVATE) 0.05 % cream Apply topically 2 (two) times daily.  50 g  3  . Lancets (FREESTYLE) lancets 1 each by Other route daily. Use as instructed  100 each  12  . lisinopril (PRINIVIL,ZESTRIL) 5 MG tablet Take 1 tablet (5 mg total) by mouth daily.  90 tablet  3  .  loratadine (CLARITIN) 10 MG tablet Take 10 mg by mouth daily as needed.      . metFORMIN (GLUCOPHAGE XR) 500 MG 24 hr tablet Take 1 tablet (500 mg total) by mouth daily with breakfast.  90 tablet  3  . multivitamin (THERAGRAN) per tablet Take 1 tablet by mouth daily.        . nitroGLYCERIN (NITROSTAT) 0.4 MG SL tablet Place 0.4 mg under the tongue every 5 (five) minutes as needed.      . SUMAtriptan (IMITREX) 25 MG tablet TAKE 1 TABLET EVERY 2 HOURSAS NEEDED  27 tablet  3  . Tamsulosin HCl (FLOMAX) 0.4 MG CAPS Take 0.4 mg  by mouth daily.      Carlena Hurl 20 MG TABS TAKE ONE TABLET BY MOUTH DAILY WITH  SUPPER  30 tablet  11   No current facility-administered medications for this visit.    Allergies  Hydromorphone hcl and Sulfonamide derivatives  Electrocardiogram:  SR PAC RBBB rate 62   Assessment and Plan

## 2013-04-10 NOTE — Assessment & Plan Note (Signed)
Cholesterol is at goal.  Continue current dose of statin and diet Rx.  No myalgias or side effects.  F/U  LFT's in 6 months. Lab Results  Component Value Date   LDLCALC 62 12/23/2012

## 2013-04-16 ENCOUNTER — Other Ambulatory Visit: Payer: Self-pay

## 2013-04-28 ENCOUNTER — Ambulatory Visit (INDEPENDENT_AMBULATORY_CARE_PROVIDER_SITE_OTHER): Payer: Medicare Other | Admitting: Internal Medicine

## 2013-04-28 ENCOUNTER — Encounter: Payer: Self-pay | Admitting: Internal Medicine

## 2013-04-28 VITALS — BP 118/70 | HR 60 | Temp 98.0°F | Resp 16 | Ht 73.0 in | Wt 178.0 lb

## 2013-04-28 DIAGNOSIS — H833X9 Noise effects on inner ear, unspecified ear: Secondary | ICD-10-CM | POA: Diagnosis not present

## 2013-04-28 DIAGNOSIS — E119 Type 2 diabetes mellitus without complications: Secondary | ICD-10-CM | POA: Diagnosis not present

## 2013-04-28 DIAGNOSIS — H833X2 Noise effects on left inner ear: Secondary | ICD-10-CM

## 2013-04-28 DIAGNOSIS — I10 Essential (primary) hypertension: Secondary | ICD-10-CM

## 2013-04-28 NOTE — Progress Notes (Signed)
Subjective:    Patient ID: Sean Lane, male    DOB: 10/24/1927, 79 y.o.   MRN: [id number]  HPI There is a 79 years old and is followed for adult onset diabetes a history of atrial flutter and a history of now controlled hypertension Current medications include Lipitor lisinopril 5 mg mainly for diabetes and Glucophage / metformin 500 mg once a day.    Review of Systems  Constitutional: Negative for fever and fatigue.  HENT: Positive for hearing loss, tinnitus and ear discharge. Negative for congestion, neck pain and postnasal drip.   Eyes: Negative for discharge, redness and visual disturbance.  Respiratory: Negative for cough, shortness of breath and wheezing.   Cardiovascular: Negative for leg swelling.  Gastrointestinal: Negative for abdominal pain, constipation and abdominal distention.  Genitourinary: Negative for urgency and frequency.  Musculoskeletal: Negative for joint swelling and arthralgias.  Skin: Negative for color change and rash.  Neurological: Negative for weakness and light-headedness.  Hematological: Negative for adenopathy.  Psychiatric/Behavioral: Negative for behavioral problems.   Past Medical History  Diagnosis Date  . Coronary atherosclerosis of unspecified type of vessel, native or graft   . Hypertension   . Hyperlipidemia   . Ulcer   . Diverticulosis of colon (without mention of hemorrhage)   . GERD (gastroesophageal reflux disease)   . Dizziness and giddiness   . Conductive hearing loss, external ear   . Osteoarthrosis, unspecified whether generalized or localized, hand   . Family history of ischemic heart disease   . Personal history of urinary calculi   . Headache(784.0)   . Osteoarthrosis, unspecified whether generalized or localized, unspecified site   . Allergy   . Atrial flutter   . Hemorrhoids   . Renal calculi   . Diabetes mellitus without complication   . Gout attack 12/14/2012  . Diabetes 12/14/2012    History   Social  History  . Marital Status: Married    Spouse Name: N/A    Number of Children: 1  . Years of Education: N/A   Occupational History  .     Social History Main Topics  . Smoking status: Former Smoker    Quit date: 09/11/1961  . Smokeless tobacco: Never Used  . Alcohol Use: No  . Drug Use: No  . Sexual Activity: Yes   Other Topics Concern  . Not on file   Social History Narrative  . No narrative on file    Past Surgical History  Procedure Laterality Date  . Total knee arthroplasty    . Cataract extraction  2005  . Foot surgery  1949  . Vasectomy  1971  . Kidney stone surgery  1995    Family History  Problem Relation Age of Onset  . Heart disease Father   . Hypertension Mother   . Arthritis Mother   . Diabetes Other     runs in family  . Stroke Other     Allergies  Allergen Reactions  . Hydromorphone Hcl     REACTION: reaction not listed  . Sulfonamide Derivatives     REACTION: reaction not given    Current Outpatient Prescriptions on File Prior to Visit  Medication Sig Dispense Refill  . aspirin EC 81 MG tablet Take 1 tablet (81 mg total) by mouth daily.      Marland Kitchen atorvastatin (LIPITOR) 10 MG tablet Take 10 mg by mouth daily.      . AVODART 0.5 MG capsule TAKE 1 CAPSULE DAILY  90 capsule  3  .  citalopram (CELEXA) 10 MG tablet Take 1 tablet (10 mg total) by mouth as needed.  90 tablet  3  . clotrimazole-betamethasone (LOTRISONE) cream Apply topically 2 (two) times daily. Until rash resolves  30 g  0  . fish oil-omega-3 fatty acids 1000 MG capsule Take 2 capsules by mouth daily.       Marland Kitchen glucose blood (FREESTYLE INSULINX TEST) test strip 1 each by Other route daily. Use as instructed  100 each  12  . halobetasol (ULTRAVATE) 0.05 % cream Apply topically 2 (two) times daily.  50 g  3  . Lancets (FREESTYLE) lancets 1 each by Other route daily. Use as instructed  100 each  12  . lisinopril (PRINIVIL,ZESTRIL) 5 MG tablet Take 1 tablet (5 mg total) by mouth daily.  90  tablet  3  . loratadine (CLARITIN) 10 MG tablet Take 10 mg by mouth daily as needed.      . metFORMIN (GLUCOPHAGE XR) 500 MG 24 hr tablet Take 1 tablet (500 mg total) by mouth daily with breakfast.  90 tablet  3  . multivitamin (THERAGRAN) per tablet Take 1 tablet by mouth daily.        . nitroGLYCERIN (NITROSTAT) 0.4 MG SL tablet Place 0.4 mg under the tongue every 5 (five) minutes as needed.      . SUMAtriptan (IMITREX) 25 MG tablet TAKE 1 TABLET EVERY 2 HOURSAS NEEDED  27 tablet  3  . Tamsulosin HCl (FLOMAX) 0.4 MG CAPS Take 0.4 mg by mouth daily.      Carlena Hurl 20 MG TABS TAKE ONE TABLET BY MOUTH DAILY WITH  SUPPER  30 tablet  11   No current facility-administered medications on file prior to visit.    BP 118/70  Pulse 60  Temp(Src) 98 F (36.7 C)  Resp 16  Ht 6\' 1"  (1.854 m)  Wt 178 lb (80.74 kg)  BMI 23.49 kg/m2       Objective:   Physical Exam  Constitutional: He appears well-developed and well-nourished.  HENT:  Head: Normocephalic and atraumatic.  Wax in the left eardrum was removed with lavage there is an air-fluid level present on the left side consistent with eustachian tube dysfunction  Eyes: Conjunctivae are normal. Pupils are equal, round, and reactive to light.  Neck: Normal range of motion. Neck supple.  Cardiovascular: Normal rate and regular rhythm.   Pulmonary/Chest: Effort normal and breath sounds normal.  Abdominal: Soft. Bowel sounds are normal.          Assessment & Plan:  We will discontinue the metformin and monitor blood sugars to make sure that they remain stable while with his hemoglobin A1c less than 6 I believe that this is appropriate at this time.  We'll continue the Lipitor and Zestril for both renal and cardiovascular prophylaxis Significant hearing loss Ear lavage with minimal wax PS some component of eustachian tube dysfunction and would recommend that he take a decongestant antihistamine twice a day for about a week to see if this  alleviates some of the hearing loss if not a referral to the nose and throat

## 2013-04-28 NOTE — Patient Instructions (Signed)
The patient is instructed to continue all medications as prescribed. Schedule followup with check out clerk upon leaving the clinic  

## 2013-06-27 DIAGNOSIS — Z23 Encounter for immunization: Secondary | ICD-10-CM | POA: Diagnosis not present

## 2013-06-30 ENCOUNTER — Encounter: Payer: Self-pay | Admitting: Internal Medicine

## 2013-07-02 ENCOUNTER — Other Ambulatory Visit: Payer: Self-pay | Admitting: *Deleted

## 2013-07-02 ENCOUNTER — Encounter: Payer: Self-pay | Admitting: Internal Medicine

## 2013-07-02 DIAGNOSIS — H9193 Unspecified hearing loss, bilateral: Secondary | ICD-10-CM

## 2013-07-02 DIAGNOSIS — H903 Sensorineural hearing loss, bilateral: Secondary | ICD-10-CM | POA: Diagnosis not present

## 2013-07-22 ENCOUNTER — Encounter: Payer: Self-pay | Admitting: Internal Medicine

## 2013-07-28 ENCOUNTER — Encounter: Payer: Self-pay | Admitting: Internal Medicine

## 2013-07-31 DIAGNOSIS — N200 Calculus of kidney: Secondary | ICD-10-CM | POA: Diagnosis not present

## 2013-07-31 DIAGNOSIS — N139 Obstructive and reflux uropathy, unspecified: Secondary | ICD-10-CM | POA: Diagnosis not present

## 2013-07-31 DIAGNOSIS — N138 Other obstructive and reflux uropathy: Secondary | ICD-10-CM | POA: Diagnosis not present

## 2013-07-31 DIAGNOSIS — N401 Enlarged prostate with lower urinary tract symptoms: Secondary | ICD-10-CM | POA: Diagnosis not present

## 2013-09-09 ENCOUNTER — Other Ambulatory Visit: Payer: Self-pay | Admitting: Internal Medicine

## 2013-09-15 ENCOUNTER — Telehealth: Payer: Self-pay | Admitting: Internal Medicine

## 2013-09-15 NOTE — Telephone Encounter (Signed)
Patients wife called stating they were told by pharmacy they sent in refill for  Lancets (FREESTYLE) lancets but it was sent with no dx codes Please advise

## 2013-09-16 ENCOUNTER — Ambulatory Visit (INDEPENDENT_AMBULATORY_CARE_PROVIDER_SITE_OTHER): Payer: Medicare Other | Admitting: Internal Medicine

## 2013-09-16 ENCOUNTER — Encounter: Payer: Self-pay | Admitting: Internal Medicine

## 2013-09-16 VITALS — BP 120/66 | HR 83 | Temp 98.1°F | Wt 177.0 lb

## 2013-09-16 DIAGNOSIS — E119 Type 2 diabetes mellitus without complications: Secondary | ICD-10-CM

## 2013-09-16 DIAGNOSIS — J019 Acute sinusitis, unspecified: Secondary | ICD-10-CM | POA: Diagnosis not present

## 2013-09-16 DIAGNOSIS — Z7901 Long term (current) use of anticoagulants: Secondary | ICD-10-CM | POA: Diagnosis not present

## 2013-09-16 DIAGNOSIS — J069 Acute upper respiratory infection, unspecified: Secondary | ICD-10-CM | POA: Diagnosis not present

## 2013-09-16 MED ORDER — AMOXICILLIN 875 MG PO TABS: 875.0000 mg | ORAL_TABLET | Freq: Three times a day (TID) | ORAL | Status: DC

## 2013-09-16 NOTE — Telephone Encounter (Signed)
This was already sent in this am and the code is 250.00 which what is on the chart

## 2013-09-16 NOTE — Telephone Encounter (Signed)
Needs to have the 250.02 code

## 2013-09-16 NOTE — Progress Notes (Signed)
Chief Complaint  Patient presents with  . Nasal Congestion    Started 1-2 weeks ago.  Has been taking Claritin, Afrin and Tylenol.  Cough drops when needed.  . Cough  . Generalized Body Aches  . Chills  . Fever    HPI: Patient comes in today for SDA for  new problem evaluation. PCP NA here with spouse.  10 day and getting worse   Fever? Temp 98  High for him and achiness. No shaking chills  body aches .  Pain in sinus is better at thit time.  But hoarse pnd and sinus drainage  Like a bad head cold  See above not better ? If worse  ROS: See pertinent positives and negatives per HPI. No current cp sob syncope bleeding  Wheeze   Past Medical History  Diagnosis Date  . Coronary atherosclerosis of unspecified type of vessel, native or graft   . Hypertension   . Hyperlipidemia   . Ulcer   . Diverticulosis of colon (without mention of hemorrhage)   . GERD (gastroesophageal reflux disease)   . Dizziness and giddiness   . Conductive hearing loss, external ear   . Osteoarthrosis, unspecified whether generalized or localized, hand   . Family history of ischemic heart disease   . Personal history of urinary calculi   . Headache(784.0)   . Osteoarthrosis, unspecified whether generalized or localized, unspecified site   . Allergy   . Atrial flutter   . Hemorrhoids   . Renal calculi   . Diabetes mellitus without complication   . Gout attack 12/14/2012  . Diabetes 12/14/2012    Family History  Problem Relation Age of Onset  . Heart disease Father   . Hypertension Mother   . Arthritis Mother   . Diabetes Other     runs in family  . Stroke Other     History   Social History  . Marital Status: Married    Spouse Name: N/A    Number of Children: 1  . Years of Education: N/A   Occupational History  .     Social History Main Topics  . Smoking status: Former Smoker    Quit date: 09/11/1961  . Smokeless tobacco: Never Used  . Alcohol Use: No  . Drug Use: No  . Sexual  Activity: Yes   Other Topics Concern  . None   Social History Narrative  . None    Outpatient Encounter Prescriptions as of 09/16/2013  Medication Sig  . aspirin EC 81 MG tablet Take 1 tablet (81 mg total) by mouth daily.  Marland Kitchen atorvastatin (LIPITOR) 10 MG tablet Take 10 mg by mouth daily.  . AVODART 0.5 MG capsule TAKE 1 CAPSULE DAILY  . citalopram (CELEXA) 10 MG tablet Take 1 tablet (10 mg total) by mouth as needed.  . clotrimazole-betamethasone (LOTRISONE) cream Apply topically 2 (two) times daily. Until rash resolves  . fish oil-omega-3 fatty acids 1000 MG capsule Take 2 capsules by mouth daily.   Marland Kitchen glucose blood (FREESTYLE INSULINX TEST) test strip 1 each by Other route daily. Use as instructed  . halobetasol (ULTRAVATE) 0.05 % cream Apply topically 2 (two) times daily.  . Lancets (FREESTYLE) lancets USE ONE LANCET DAILY AS INSTRUCTED.  Marland Kitchen lisinopril (PRINIVIL,ZESTRIL) 5 MG tablet Take 1 tablet (5 mg total) by mouth daily.  Marland Kitchen loratadine (CLARITIN) 10 MG tablet Take 10 mg by mouth daily as needed.  . multivitamin (THERAGRAN) per tablet Take 1 tablet by mouth daily.    Marland Kitchen  nitroGLYCERIN (NITROSTAT) 0.4 MG SL tablet Place 0.4 mg under the tongue every 5 (five) minutes as needed.  . SUMAtriptan (IMITREX) 25 MG tablet TAKE 1 TABLET EVERY 2 HOURSAS NEEDED  . Tamsulosin HCl (FLOMAX) 0.4 MG CAPS Take 0.4 mg by mouth daily.  Alveda Reasons 20 MG TABS TAKE ONE TABLET BY MOUTH DAILY WITH  SUPPER  . amoxicillin (AMOXIL) 875 MG tablet Take 1 tablet (875 mg total) by mouth 3 (three) times daily. For sinusitis  . [DISCONTINUED] metFORMIN (GLUCOPHAGE XR) 500 MG 24 hr tablet Take 1 tablet (500 mg total) by mouth daily with breakfast.    EXAM:  BP 120/66  Pulse 83  Temp(Src) 98.1 F (36.7 C) (Oral)  Wt 177 lb (80.287 kg)  SpO2 98%  Body mass index is 23.36 kg/(m^2). WDWN in NAD  quiet respirations; congested  . Non toxic . HEENT: Normocephalic ;atraumatic , Eyes;  PERRL, EOMs  Full, lids and  conjunctiva clear,,Ears: no deformities, canals nl,has hearing aids  TM landmarks normal, Nose: no deformity  but congested;face minimally tender Mouth : OP clear without lesion or edema . Neck: Supple without adenopathy or masses  Chest:  Clear to A&P without wheezes rales or rhonchi CV:  S1-S2 no gallops or murmurs peripheral perfusion is normal Skin :nl perfusion and no acute rashes  MS: moves all extremities without noticeable focal  abnormality  PSYCH: pleasant and cooperative, no obvious depression or anxiety  ASSESSMENT AND PLAN:  Discussed the following assessment and plan:  Acute sinusitis with symptoms greater than 10 days  Protracted URI  Anticoagulant long-term use  DM (diabetes mellitus) - stable by report  lost weight and sugars in control  Possibly could resolve on its and but 10 days without improvement in high risk age group we'll add antibiotic high-dose amoxicillin discussed. Expectant management and followup is signs of pneumonia at this time. -Patient advised to return or notify health care team  if symptoms worsen or persist or new concerns arise.  Patient Instructions    Sinusitis Sinusitis is redness, soreness, and swelling (inflammation) of the paranasal sinuses. Paranasal sinuses are air pockets within the bones of your face (beneath the eyes, the middle of the forehead, or above the eyes). In healthy paranasal sinuses, mucus is able to drain out, and air is able to circulate through them by way of your nose. However, when your paranasal sinuses are inflamed, mucus and air can become trapped. This can allow bacteria and other germs to grow and cause infection. Sinusitis can develop quickly and last only a short time (acute) or continue over a long period (chronic). Sinusitis that lasts for more than 12 weeks is considered chronic.  CAUSES  Causes of sinusitis include:  Allergies.  Structural abnormalities, such as displacement of the cartilage that  separates your nostrils (deviated septum), which can decrease the air flow through your nose and sinuses and affect sinus drainage.  Functional abnormalities, such as when the small hairs (cilia) that line your sinuses and help remove mucus do not work properly or are not present. SYMPTOMS  Symptoms of acute and chronic sinusitis are the same. The primary symptoms are pain and pressure around the affected sinuses. Other symptoms include:  Upper toothache.  Earache.  Headache.  Bad breath.  Decreased sense of smell and taste.  A cough, which worsens when you are lying flat.  Fatigue.  Fever.  Thick drainage from your nose, which often is green and may contain pus (purulent).  Swelling and warmth  over the affected sinuses. DIAGNOSIS  Your caregiver will perform a physical exam. During the exam, your caregiver may:  Look in your nose for signs of abnormal growths in your nostrils (nasal polyps).  Tap over the affected sinus to check for signs of infection.  View the inside of your sinuses (endoscopy) with a special imaging device with a light attached (endoscope), which is inserted into your sinuses. If your caregiver suspects that you have chronic sinusitis, one or more of the following tests may be recommended:  Allergy tests.  Nasal culture A sample of mucus is taken from your nose and sent to a lab and screened for bacteria.  Nasal cytology A sample of mucus is taken from your nose and examined by your caregiver to determine if your sinusitis is related to an allergy. TREATMENT  Most cases of acute sinusitis are related to a viral infection and will resolve on their own within 10 days. Sometimes medicines are prescribed to help relieve symptoms (pain medicine, decongestants, nasal steroid sprays, or saline sprays).  However, for sinusitis related to a bacterial infection, your caregiver will prescribe antibiotic medicines. These are medicines that will help kill the  bacteria causing the infection.  Rarely, sinusitis is caused by a fungal infection. In theses cases, your caregiver will prescribe antifungal medicine. For some cases of chronic sinusitis, surgery is needed. Generally, these are cases in which sinusitis recurs more than 3 times per year, despite other treatments. HOME CARE INSTRUCTIONS   Drink plenty of water. Water helps thin the mucus so your sinuses can drain more easily.  Use a humidifier.  Inhale steam 3 to 4 times a day (for example, sit in the bathroom with the shower running).  Apply a warm, moist washcloth to your face 3 to 4 times a day, or as directed by your caregiver.  Use saline nasal sprays to help moisten and clean your sinuses.  Take over-the-counter or prescription medicines for pain, discomfort, or fever only as directed by your caregiver. SEEK IMMEDIATE MEDICAL CARE IF:  You have increasing pain or severe headaches.  You have nausea, vomiting, or drowsiness.  You have swelling around your face.  You have vision problems.  You have a stiff neck.  You have difficulty breathing. MAKE SURE YOU:   Understand these instructions.  Will watch your condition.  Will get help right away if you are not doing well or get worse. Document Released: 08/28/2005 Document Revised: 11/20/2011 Document Reviewed: 09/12/2011 [facility] Patient Information 2014 [place], [place].    Standley Brooking. Parveen Freehling M.D.  Pre visit review using our clinic review tool, if applicable. No additional management support is needed unless otherwise documented below in the visit note.

## 2013-09-16 NOTE — Patient Instructions (Signed)

## 2013-09-17 ENCOUNTER — Telehealth: Payer: Self-pay

## 2013-09-17 NOTE — Telephone Encounter (Signed)
Relevant patient education assigned to patient using Emmi. ° °

## 2013-09-26 ENCOUNTER — Other Ambulatory Visit: Payer: Self-pay | Admitting: Internal Medicine

## 2013-09-29 ENCOUNTER — Ambulatory Visit: Payer: Medicare Other | Admitting: Internal Medicine

## 2013-10-07 DIAGNOSIS — H43819 Vitreous degeneration, unspecified eye: Secondary | ICD-10-CM | POA: Diagnosis not present

## 2013-10-07 LAB — HM DIABETES EYE EXAM

## 2013-10-10 ENCOUNTER — Encounter: Payer: Self-pay | Admitting: Cardiovascular Disease

## 2013-10-10 ENCOUNTER — Ambulatory Visit (INDEPENDENT_AMBULATORY_CARE_PROVIDER_SITE_OTHER): Payer: Medicare Other | Admitting: Cardiovascular Disease

## 2013-10-10 VITALS — BP 112/60 | HR 60 | Ht 73.0 in | Wt 179.8 lb

## 2013-10-10 DIAGNOSIS — R0600 Dyspnea, unspecified: Secondary | ICD-10-CM

## 2013-10-10 DIAGNOSIS — I2510 Atherosclerotic heart disease of native coronary artery without angina pectoris: Secondary | ICD-10-CM | POA: Diagnosis not present

## 2013-10-10 DIAGNOSIS — I10 Essential (primary) hypertension: Secondary | ICD-10-CM | POA: Diagnosis not present

## 2013-10-10 DIAGNOSIS — R5381 Other malaise: Secondary | ICD-10-CM

## 2013-10-10 DIAGNOSIS — R5383 Other fatigue: Secondary | ICD-10-CM

## 2013-10-10 DIAGNOSIS — R0989 Other specified symptoms and signs involving the circulatory and respiratory systems: Secondary | ICD-10-CM

## 2013-10-10 DIAGNOSIS — R0609 Other forms of dyspnea: Secondary | ICD-10-CM

## 2013-10-10 DIAGNOSIS — I4510 Unspecified right bundle-branch block: Secondary | ICD-10-CM

## 2013-10-10 NOTE — Assessment & Plan Note (Signed)
Stable with no angina and good activity level.  Continue medical Rx With some fatigue , PAF and some exertional dsypnea will check echo Last echo 5/13 with mild LAE mild MR and normal EF

## 2013-10-10 NOTE — Progress Notes (Signed)
Patient ID: Sean Lane, male   DOB: Apr 25, 1928, 80 y.o.   MRN: [id number] Sean Lane returns today for followup. He is a very pleasant 80 year old man with a history of atrial flutter, hypertension, and sinus bradycardia. The patient has maintaining sinus rhythm since last February. I had initially seen him in January and he was in atrial flutter with a controlled ventricular response. He spontaneously reverted to sinus rhythm. He was placed on anticoagulation with Coumadin. He denies chest pain or shortness of breath. His only complaint today is that he misses not being able to exercise. I asked him why he was not exercising and he was not certain but thought he has not been supposed to. No syncope and no peripheral edema. No chest pain.  Changed to xarelto end of June having microhematuria Sees Sean Lane for prostate  He has a history of CAD, s/p PCI to CFX in 2009 with Cypher DES x2, hypertension, hyperlipidemia, right bundle branch block, GERD, BPH. LHC 3/09: Mid RCA 50%, left main 20%, proximal LAD 40-50%, mid LAD 40%, proximal circumflex 80% and an 80% after OM2, left PDA 40%, EF 60%. PCI: Cypher DES x2 to the proximal and mid circumflex. Monitor in 2011 demonstrated NSR, PACs, right bundle branch block, no long pauses, no SVT, no high-grade heart block. Last exercise treadmill test 1/12: Heart rate increased appropriately, no ischemic EKG changes. Last seen by me 7/13 Since then has lost weight and developed DM. On meds now and trying to eat low carb diet Dizzyness somewhat postural  Discussed future need of pacer. Avoid all AV nodal blocking drugs. He exercises regularly and has good chronotropic response      ROS: Denies fever, malais, weight loss, blurry vision, decreased visual acuity, cough, sputum, SOB, hemoptysis, pleuritic pain, palpitaitons, heartburn, abdominal pain, melena, lower extremity edema, claudication, or rash.  All other systems reviewed and negative  General: Affect  appropriate Healthy:  appears stated age 80: normal Neck supple with no adenopathy JVP normal no bruits no thyromegaly Lungs clear with no wheezing and good diaphragmatic motion Heart:  S1/S2 no murmur, no rub, gallop or click PMI normal Abdomen: benighn, BS positve, no tenderness, no AAA no bruit.  No HSM or HJR Distal pulses intact with no bruits No edema Neuro non-focal Skin warm and dry No muscular weakness   Current Outpatient Prescriptions  Medication Sig Dispense Refill  . aspirin EC 81 MG tablet Take 1 tablet (81 mg total) by mouth daily.      Marland Kitchen atorvastatin (LIPITOR) 10 MG tablet Take 10 mg by mouth daily.      . AVODART 0.5 MG capsule TAKE 1 CAPSULE DAILY  90 capsule  3  . citalopram (CELEXA) 10 MG tablet Take 1 tablet (10 mg total) by mouth as needed.  90 tablet  3  . clotrimazole-betamethasone (LOTRISONE) cream Apply topically 2 (two) times daily. Until rash resolves  30 g  0  . fish oil-omega-3 fatty acids 1000 MG capsule Take 4 capsules by mouth daily.       Marland Kitchen glucose blood (FREESTYLE INSULINX TEST) test strip 1 each by Other route daily. Use as instructed  100 each  12  . halobetasol (ULTRAVATE) 0.05 % cream Apply topically 2 (two) times daily.  50 g  3  . Lancets (FREESTYLE) lancets USE ONE LANCET DAILY AS INSTRUCTED.  100 each  11  . lisinopril (PRINIVIL,ZESTRIL) 5 MG tablet Take 1 tablet (5 mg total) by mouth daily.  90 tablet  3  .  loratadine (CLARITIN) 10 MG tablet Take 10 mg by mouth daily as needed.      . multivitamin (THERAGRAN) per tablet Take 1 tablet by mouth daily.        . nitroGLYCERIN (NITROSTAT) 0.4 MG SL tablet Place 0.4 mg under the tongue every 5 (five) minutes as needed.      . SUMAtriptan (IMITREX) 25 MG tablet TAKE 1 TABLET EVERY 2 HOURSAS NEEDED  27 tablet  3  . Tamsulosin HCl (FLOMAX) 0.4 MG CAPS Take 0.4 mg by mouth daily.      Alveda Reasons 20 MG TABS TAKE ONE TABLET BY MOUTH DAILY WITH  SUPPER  30 tablet  11   No current  facility-administered medications for this visit.    Allergies  Hydromorphone hcl and Sulfonamide derivatives  Electrocardiogram:  SR rate 62 RBBB 7/14   Assessment and Plan

## 2013-10-10 NOTE — Patient Instructions (Signed)

## 2013-10-10 NOTE — Assessment & Plan Note (Signed)
Well controlled.  Continue current medications and low sodium Dash type diet.    

## 2013-10-10 NOTE — Assessment & Plan Note (Signed)
Maint NSR no palpitations despite this and SSS no high grade AV block noted

## 2013-10-10 NOTE — Assessment & Plan Note (Signed)
Stable no AV nodal blocking drugs previous ETT with no chronotropic incompetence  Has seen Dr Lovena Le

## 2013-10-28 ENCOUNTER — Encounter: Payer: Self-pay | Admitting: Internal Medicine

## 2013-11-03 ENCOUNTER — Ambulatory Visit (INDEPENDENT_AMBULATORY_CARE_PROVIDER_SITE_OTHER): Payer: Medicare Other | Admitting: Internal Medicine

## 2013-11-03 ENCOUNTER — Encounter: Payer: Self-pay | Admitting: Internal Medicine

## 2013-11-03 VITALS — BP 120/70 | HR 72 | Temp 97.6°F | Resp 16 | Ht 73.0 in | Wt 180.0 lb

## 2013-11-03 DIAGNOSIS — M19049 Primary osteoarthritis, unspecified hand: Secondary | ICD-10-CM

## 2013-11-03 DIAGNOSIS — I10 Essential (primary) hypertension: Secondary | ICD-10-CM

## 2013-11-03 DIAGNOSIS — I2510 Atherosclerotic heart disease of native coronary artery without angina pectoris: Secondary | ICD-10-CM

## 2013-11-03 DIAGNOSIS — Z23 Encounter for immunization: Secondary | ICD-10-CM

## 2013-11-03 DIAGNOSIS — R7309 Other abnormal glucose: Secondary | ICD-10-CM | POA: Diagnosis not present

## 2013-11-03 DIAGNOSIS — R739 Hyperglycemia, unspecified: Secondary | ICD-10-CM

## 2013-11-03 LAB — BASIC METABOLIC PANEL
BUN: 17 mg/dL (ref 6–23)
CHLORIDE: 106 meq/L (ref 96–112)
CO2: 29 mEq/L (ref 19–32)
Calcium: 10 mg/dL (ref 8.4–10.5)
Creatinine, Ser: 0.8 mg/dL (ref 0.4–1.5)
GFR: 92.67 mL/min (ref >60.00)
GLUCOSE: 84 mg/dL (ref 70–99)
POTASSIUM: 4.2 meq/L (ref 3.5–5.1)
SODIUM: 140 meq/L (ref 135–145)

## 2013-11-03 LAB — HEMOGLOBIN A1C: Hgb A1c MFr Bld: 5.6 % (ref 4.6–6.5)

## 2013-11-03 MED ORDER — METHYLPREDNISOLONE ACETATE 40 MG/ML IJ SUSP: 20.0000 mg | Freq: Once | INTRAMUSCULAR | Status: DC

## 2013-11-03 NOTE — Progress Notes (Signed)
Pre visit review using our clinic review tool, if applicable. No additional management support is needed unless otherwise documented below in the visit note. 

## 2013-11-03 NOTE — Progress Notes (Signed)
Subjective:    Patient ID: Sean Lane, male    DOB: 09-29-1927, 80 y.o.   MRN: [id number]  Hypertension Associated symptoms include shortness of breath. Pertinent negatives include no neck pain.  Diabetes Pertinent negatives for diabetes include no fatigue and no weakness.  Atrial Fibrillation Symptoms include hypertension and shortness of breath. Symptoms are negative for weakness. Past medical history includes atrial fibrillation and hyperlipidemia.  Hyperlipidemia Associated symptoms include shortness of breath.    Doing well and has an appointment for echo tomorrow due to some mild DOE and PAF Last echo normal EF  Review of Systems  Constitutional: Negative for fever and fatigue.  HENT: Negative for congestion, hearing loss and postnasal drip.   Eyes: Negative for discharge, redness and visual disturbance.  Respiratory: Positive for shortness of breath. Negative for cough and wheezing.        Mild sob occasional and with exertion   Cardiovascular: Negative for leg swelling.  Gastrointestinal: Negative for abdominal pain, constipation and abdominal distention.  Genitourinary: Negative for urgency and frequency.  Musculoskeletal: Positive for joint swelling. Negative for arthralgias and neck pain.       Pain in thenar last injection 2 years  Skin: Negative for color change and rash.  Neurological: Negative for weakness and light-headedness.  Hematological: Negative for adenopathy.  Psychiatric/Behavioral: Negative for behavioral problems.   Past Medical History  Diagnosis Date  . Coronary atherosclerosis of unspecified type of vessel, native or graft   . Hypertension   . Hyperlipidemia   . Ulcer   . Diverticulosis of colon (without mention of hemorrhage)   . GERD (gastroesophageal reflux disease)   . Dizziness and giddiness   . Conductive hearing loss, external ear   . Osteoarthrosis, unspecified whether generalized or localized, hand   . Family history of  ischemic heart disease   . Personal history of urinary calculi   . Headache(784.0)   . Osteoarthrosis, unspecified whether generalized or localized, unspecified site   . Allergy   . Atrial flutter   . Hemorrhoids   . Renal calculi   . Diabetes mellitus without complication   . Gout attack 12/14/2012  . Diabetes 12/14/2012    History   Social History  . Marital Status: Married    Spouse Name: N/A    Number of Children: 1  . Years of Education: N/A   Occupational History  .     Social History Main Topics  . Smoking status: Former Smoker    Quit date: 09/11/1961  . Smokeless tobacco: Never Used  . Alcohol Use: No  . Drug Use: No  . Sexual Activity: Yes   Other Topics Concern  . Not on file   Social History Narrative  . No narrative on file    Past Surgical History  Procedure Laterality Date  . Total knee arthroplasty    . Cataract extraction  2005  . Foot surgery  1949  . Vasectomy  1971  . Kidney stone surgery  1995    Family History  Problem Relation Age of Onset  . Heart disease Father   . Hypertension Mother   . Arthritis Mother   . Diabetes Other     runs in family  . Stroke Other     Allergies  Allergen Reactions  . Hydromorphone Hcl     REACTION: reaction not listed  . Sulfonamide Derivatives     REACTION: reaction not given    Current Outpatient Prescriptions on File Prior to Visit  Medication Sig Dispense Refill  . aspirin EC 81 MG tablet Take 1 tablet (81 mg total) by mouth daily.      Marland Kitchen atorvastatin (LIPITOR) 10 MG tablet Take 10 mg by mouth daily.      . AVODART 0.5 MG capsule TAKE 1 CAPSULE DAILY  90 capsule  3  . citalopram (CELEXA) 10 MG tablet Take 1 tablet (10 mg total) by mouth as needed.  90 tablet  3  . clotrimazole-betamethasone (LOTRISONE) cream Apply topically 2 (two) times daily. Until rash resolves  30 g  0  . fish oil-omega-3 fatty acids 1000 MG capsule Take 4 capsules by mouth daily.       Marland Kitchen glucose blood (FREESTYLE  INSULINX TEST) test strip 1 each by Other route daily. Use as instructed  100 each  12  . halobetasol (ULTRAVATE) 0.05 % cream Apply topically 2 (two) times daily.  50 g  3  . Lancets (FREESTYLE) lancets USE ONE LANCET DAILY AS INSTRUCTED.  100 each  11  . lisinopril (PRINIVIL,ZESTRIL) 5 MG tablet Take 1 tablet (5 mg total) by mouth daily.  90 tablet  3  . loratadine (CLARITIN) 10 MG tablet Take 10 mg by mouth daily as needed.      . multivitamin (THERAGRAN) per tablet Take 1 tablet by mouth daily.        . nitroGLYCERIN (NITROSTAT) 0.4 MG SL tablet Place 0.4 mg under the tongue every 5 (five) minutes as needed.      . SUMAtriptan (IMITREX) 25 MG tablet TAKE 1 TABLET EVERY 2 HOURSAS NEEDED  27 tablet  3  . Tamsulosin HCl (FLOMAX) 0.4 MG CAPS Take 0.4 mg by mouth daily.      Alveda Reasons 20 MG TABS TAKE ONE TABLET BY MOUTH DAILY WITH  SUPPER  30 tablet  11   No current facility-administered medications on file prior to visit.    BP 120/70  Pulse 72  Temp(Src) 97.6 F (36.4 C)  Resp 16  Ht 6\' 1"  (1.854 m)  Wt 180 lb (81.647 kg)  BMI 23.75 kg/m2       Objective:   Physical Exam  Constitutional: He appears well-developed and well-nourished.  HENT:  Head: Normocephalic and atraumatic.  Eyes: Conjunctivae are normal. Pupils are equal, round, and reactive to light.  Neck: Normal range of motion. Neck supple.  Cardiovascular: Normal rate and regular rhythm.   Murmur heard. Pulmonary/Chest: Effort normal and breath sounds normal.  Abdominal: Soft. Bowel sounds are normal.  Musculoskeletal: He exhibits edema and tenderness.  Thumb pain worsened  Skin: Skin is warm and dry.          Assessment & Plan:  From a cardiovascular standpoint some mild shortness of breath agree with evaluation with echocardiogram but suspect ejection fraction we'll not have changed.  He does have some paroxysmal atrial fibrillation but is in mostly sinus rhythm.  His primary complaint today is from  arthritis.  Last injection was probably 2 years ago he states that the pain in his thumbs at the base of his thumbs is now severe enough that he avoids certain activities.   Informed consent obtained and the patient's right and left thumbs  were prepped with betadine. Local anesthesia was obtained with topical spray. Then 20 mg of Depo-Medrol and 1/4 cc of lidocaine was injected into the joint space. The patient tolerated the procedure without complications. Post injection care discussed with patient. X two

## 2013-11-03 NOTE — Patient Instructions (Signed)
The patient is instructed to continue all medications as prescribed. Schedule followup with check out clerk upon leaving the clinic  

## 2013-11-03 NOTE — Addendum Note (Signed)
Addended by: Allyne Gee on: 11/03/2013 03:49 PM   Modules accepted: Orders

## 2013-11-04 ENCOUNTER — Other Ambulatory Visit (HOSPITAL_COMMUNITY): Payer: Medicare Other

## 2013-11-04 ENCOUNTER — Telehealth: Payer: Self-pay | Admitting: Internal Medicine

## 2013-11-04 NOTE — Telephone Encounter (Signed)
Relevant patient education assigned to patient using Emmi. ° °

## 2013-11-20 ENCOUNTER — Encounter: Payer: Self-pay | Admitting: Cardiovascular Disease

## 2013-11-20 ENCOUNTER — Other Ambulatory Visit: Payer: Self-pay | Admitting: Internal Medicine

## 2013-11-20 ENCOUNTER — Ambulatory Visit (HOSPITAL_COMMUNITY): Payer: Medicare Other | Attending: Cardiovascular Disease | Admitting: Radiology

## 2013-11-20 DIAGNOSIS — R5383 Other fatigue: Secondary | ICD-10-CM | POA: Diagnosis not present

## 2013-11-20 DIAGNOSIS — R0989 Other specified symptoms and signs involving the circulatory and respiratory systems: Secondary | ICD-10-CM | POA: Diagnosis not present

## 2013-11-20 DIAGNOSIS — R0600 Dyspnea, unspecified: Secondary | ICD-10-CM

## 2013-11-20 DIAGNOSIS — R0609 Other forms of dyspnea: Secondary | ICD-10-CM | POA: Diagnosis not present

## 2013-11-20 DIAGNOSIS — R5381 Other malaise: Secondary | ICD-10-CM | POA: Insufficient documentation

## 2013-11-20 NOTE — Progress Notes (Signed)
Echocardiogram performed.  

## 2014-01-01 ENCOUNTER — Other Ambulatory Visit: Payer: Self-pay | Admitting: Internal Medicine

## 2014-01-19 ENCOUNTER — Ambulatory Visit: Payer: Medicare Other | Admitting: Internal Medicine

## 2014-01-29 ENCOUNTER — Other Ambulatory Visit: Payer: Self-pay | Admitting: Dermatology

## 2014-01-29 DIAGNOSIS — L259 Unspecified contact dermatitis, unspecified cause: Secondary | ICD-10-CM | POA: Diagnosis not present

## 2014-03-05 DIAGNOSIS — M19049 Primary osteoarthritis, unspecified hand: Secondary | ICD-10-CM | POA: Diagnosis not present

## 2014-03-05 DIAGNOSIS — M109 Gout, unspecified: Secondary | ICD-10-CM | POA: Diagnosis not present

## 2014-03-24 DIAGNOSIS — H1130 Conjunctival hemorrhage, unspecified eye: Secondary | ICD-10-CM | POA: Diagnosis not present

## 2014-03-26 ENCOUNTER — Telehealth: Payer: Self-pay | Admitting: Internal Medicine

## 2014-03-26 NOTE — Telephone Encounter (Signed)
Please add AVODART 0.5 MG capsule to the re-fill request

## 2014-03-26 NOTE — Telephone Encounter (Signed)
[name], Sean Lane is requesting re-fill on atorvastatin (LIPITOR) 10 MG tablet

## 2014-03-27 MED ORDER — DUTASTERIDE 0.5 MG PO CAPS: ORAL_CAPSULE | ORAL | Status: DC

## 2014-03-27 MED ORDER — ATORVASTATIN CALCIUM 10 MG PO TABS
10.0000 mg | ORAL_TABLET | Freq: Every day | ORAL | Status: DC
Start: 2014-03-27 — End: 2014-04-09

## 2014-03-27 NOTE — Telephone Encounter (Signed)
Rx sent to [facility]. 

## 2014-03-30 DIAGNOSIS — M19049 Primary osteoarthritis, unspecified hand: Secondary | ICD-10-CM | POA: Diagnosis not present

## 2014-04-01 ENCOUNTER — Telehealth: Payer: Self-pay | Admitting: Cardiovascular Disease

## 2014-04-01 NOTE — Telephone Encounter (Signed)
BP and HR ok f/u with primary for r/o vertigo posterior circulation stroke

## 2014-04-01 NOTE — Telephone Encounter (Signed)
New message    Wife calling    Patient C/O dizzy this am  .   Blood pressure @ 6:30 115/42 pulse  46 . Retake in  30 min  112/57 pulse  58.   121/55 pulse 44  Just now . Wife stated he has low blood pressure never dizzy.

## 2014-04-01 NOTE — Telephone Encounter (Signed)
SPOKE  WITH PT'S  WIFE  PT  AWOKE THIS AM  COMPLAINING  ROOM WAS SPINNING  B/P  WAS  116/56  HR  52  CHECKED  VS  AGAIN   B/P WAS  120/60 HR   46   FEELS  A LITTLE  LIGHTHEADED   AT THIS TIME   WILL FORWARD   TO DR  Johnsie Cancel FOR  RECOMMENDATIONS./CY

## 2014-04-01 NOTE — Telephone Encounter (Signed)
PT'S  WIFE  NOTIFIED TO CALL PMD  .Adonis Housekeeper

## 2014-04-02 ENCOUNTER — Emergency Department (HOSPITAL_COMMUNITY)
Admission: EM | Admit: 2014-04-02 | Discharge: 2014-04-02 | Disposition: A | Discharge status: Home / Self Care | Payer: Medicare Other | Attending: Emergency Medicine | Admitting: Emergency Medicine

## 2014-04-02 ENCOUNTER — Telehealth: Payer: Self-pay | Admitting: Internal Medicine

## 2014-04-02 ENCOUNTER — Encounter (HOSPITAL_COMMUNITY): Payer: Self-pay | Admitting: Emergency Medicine

## 2014-04-02 DIAGNOSIS — H902 Conductive hearing loss, unspecified: Secondary | ICD-10-CM | POA: Insufficient documentation

## 2014-04-02 DIAGNOSIS — Z8719 Personal history of other diseases of the digestive system: Secondary | ICD-10-CM | POA: Insufficient documentation

## 2014-04-02 DIAGNOSIS — E785 Hyperlipidemia, unspecified: Secondary | ICD-10-CM | POA: Insufficient documentation

## 2014-04-02 DIAGNOSIS — R51 Headache: Secondary | ICD-10-CM | POA: Insufficient documentation

## 2014-04-02 DIAGNOSIS — M1990 Unspecified osteoarthritis, unspecified site: Secondary | ICD-10-CM | POA: Diagnosis not present

## 2014-04-02 DIAGNOSIS — Z87891 Personal history of nicotine dependence: Secondary | ICD-10-CM | POA: Insufficient documentation

## 2014-04-02 DIAGNOSIS — Z7982 Long term (current) use of aspirin: Secondary | ICD-10-CM | POA: Diagnosis not present

## 2014-04-02 DIAGNOSIS — R42 Dizziness and giddiness: Secondary | ICD-10-CM | POA: Diagnosis not present

## 2014-04-02 DIAGNOSIS — Z872 Personal history of diseases of the skin and subcutaneous tissue: Secondary | ICD-10-CM | POA: Insufficient documentation

## 2014-04-02 DIAGNOSIS — E119 Type 2 diabetes mellitus without complications: Secondary | ICD-10-CM | POA: Insufficient documentation

## 2014-04-02 DIAGNOSIS — I10 Essential (primary) hypertension: Secondary | ICD-10-CM | POA: Diagnosis not present

## 2014-04-02 DIAGNOSIS — I2510 Atherosclerotic heart disease of native coronary artery without angina pectoris: Secondary | ICD-10-CM | POA: Diagnosis not present

## 2014-04-02 DIAGNOSIS — Z79899 Other long term (current) drug therapy: Secondary | ICD-10-CM | POA: Diagnosis not present

## 2014-04-02 DIAGNOSIS — Z87442 Personal history of urinary calculi: Secondary | ICD-10-CM | POA: Insufficient documentation

## 2014-04-02 LAB — BASIC METABOLIC PANEL
ANION GAP: 11 (ref 5–15)
BUN: 23 mg/dL (ref 6–23)
CHLORIDE: 104 meq/L (ref 96–112)
CO2: 26 mEq/L (ref 19–32)
Calcium: 9.7 mg/dL (ref 8.4–10.5)
Creatinine, Ser: 0.8 mg/dL (ref 0.50–1.35)
GFR calc Af Amer: >90 mL/min (ref >90)
GFR calc non Af Amer: 80 mL/min — ABNORMAL LOW (ref >90)
Glucose, Bld: 128 mg/dL — ABNORMAL HIGH (ref 70–99)
POTASSIUM: 4.2 meq/L (ref 3.7–5.3)
Sodium: 141 mEq/L (ref 137–147)

## 2014-04-02 LAB — CBC WITH DIFFERENTIAL/PLATELET
BASOS ABS: 0 10*3/uL (ref 0.0–0.1)
Basophils Relative: 0 % (ref 0–1)
Eosinophils Absolute: 0.1 10*3/uL (ref 0.0–0.7)
Eosinophils Relative: 2 % (ref 0–5)
HCT: 41.6 % (ref 39.0–52.0)
Hemoglobin: 14.8 g/dL (ref 13.0–17.0)
LYMPHS PCT: 27 % (ref 12–46)
Lymphs Abs: 1.5 10*3/uL (ref 0.7–4.0)
MCH: 32.5 pg (ref 26.0–34.0)
MCHC: 35.6 g/dL (ref 30.0–36.0)
MCV: 91.4 fL (ref 78.0–100.0)
Monocytes Absolute: 0.4 10*3/uL (ref 0.1–1.0)
Monocytes Relative: 6 % (ref 3–12)
NEUTROS ABS: 3.6 10*3/uL (ref 1.7–7.7)
Neutrophils Relative %: 65 % (ref 43–77)
Platelets: 141 10*3/uL — ABNORMAL LOW (ref 150–400)
RBC: 4.55 MIL/uL (ref 4.22–5.81)
RDW: 13.3 % (ref 11.5–15.5)
WBC: 5.6 10*3/uL (ref 4.0–10.5)

## 2014-04-02 NOTE — ED Notes (Signed)
Pt sts yesterday when he woke up he felt very dizzy, like the room was spinning, checked his pulse it was 42. sts the dizziness lasted approx 30 mins, then went away. sts no other symptoms with that. Pt sts he called his cardiologist and PCP and was told to follow up with the ED. sts he was also told he may need a pacemaker. Hx of a.flutter 2 years ago, sts no symptoms with that when he had it. Nad, skin warm and dry, resp e/u.

## 2014-04-02 NOTE — Telephone Encounter (Signed)
Patient Information:  Caller Name: [name]  Phone: [phone]  Patient: Sean Lane, Sean Lane  Gender: Male  DOB: 1928-04-01  Age: 80 Years  PCP: Benay Pillow (Adults only, leaving end of July 2015)  Office Follow Up:  Does the office need to follow up with this patient?: Yes  Instructions For The Office: Per Saundrea's request--Advise proceed now to ED per protocol and send note via Epic for provider review.  RN Note:  Report to [place] who requests to send  Symptoms  Reason For Call & Symptoms: Constant Light-Headedness and Dizziness, with Low Erratic Pulse 40s-50s. Was told yesterday, 7/22,  by cardiologist to follow up with PCP after spouse called because of a first-time occurrence of vertigo/room spinning sensation. Spouse reports this feeling subsided after approximately 30 minutes and since told to follow up with PCP this is why they are calling today, 7/23. Spouse reports patient has no new symptoms or concerns now, but does continue to have lightheadedness and dizziness "as he always does." Spouse reports cardiologist and PCP are both aware of this and  "both keep a close eye on this" but with room spinning sensation occurring for first time, this is why they called. Report called to [place] who advises proceed to ED now per protocol and send note via Epic. Spoouse made aware to proceed now to ED. Spouse requests that I call [facility] to make them aware that they are coming. Report called to [facility] per patient request.  Reviewed Health History In EMR: Yes  Reviewed Medications In EMR: Yes  Reviewed Allergies In EMR: Yes  Reviewed Surgeries / Procedures: Yes  Date of Onset of Symptoms: 04/01/2014  Guideline(s) Used:  Heart Rate and Heartbeat Questions  Disposition Per Guideline:   Go to ED Now  Reason For Disposition Reached:   Dizziness, lightheadedness, or weakness  Advice Given:  N/A  Patient Will Follow Care Advice:  YES

## 2014-04-02 NOTE — ED Notes (Signed)
Yesterday was dizzy and pulse was 42 called cards dr and was sent here for tests

## 2014-04-02 NOTE — Telephone Encounter (Signed)
Noted  

## 2014-04-02 NOTE — Discharge Instructions (Signed)
Benign Positional Vertigo Vertigo means you feel like you or your surroundings are moving when they are not. Benign positional vertigo is the most common form of vertigo. Benign means that the cause of your condition is not serious. Benign positional vertigo is more common in older adults. CAUSES  Benign positional vertigo is the result of an upset in the labyrinth system. This is an area in the middle ear that helps control your balance. This may be caused by a viral infection, head injury, or repetitive motion. However, often no specific cause is found. SYMPTOMS  Symptoms of benign positional vertigo occur when you move your head or eyes in different directions. Some of the symptoms may include:  Loss of balance and falls.  Vomiting.  Blurred vision.  Dizziness.  Nausea.  Involuntary eye movements (nystagmus). DIAGNOSIS  Benign positional vertigo is usually diagnosed by physical exam. If the specific cause of your benign positional vertigo is unknown, your caregiver may perform imaging tests, such as magnetic resonance imaging (MRI) or computed tomography (CT). TREATMENT  Your caregiver may recommend movements or procedures to correct the benign positional vertigo. Medicines such as meclizine, benzodiazepines, and medicines for nausea may be used to treat your symptoms. In rare cases, if your symptoms are caused by certain conditions that affect the inner ear, you may need surgery. HOME CARE INSTRUCTIONS   Follow your caregiver's instructions.  Move slowly. Do not make sudden body or head movements.  Avoid driving.  Avoid operating heavy machinery.  Avoid performing any tasks that would be dangerous to you or others during a vertigo episode.  Drink enough fluids to keep your urine clear or pale yellow. SEEK IMMEDIATE MEDICAL CARE IF:   You develop problems with walking, weakness, numbness, or using your arms, hands, or legs.  You have difficulty speaking.  You develop  severe headaches.  Your nausea or vomiting continues or gets worse.  You develop visual changes.  Your family or friends notice any behavioral changes.  Your condition gets worse.  You have a fever.  You develop a stiff neck or sensitivity to light. MAKE SURE YOU:   Understand these instructions.  Will watch your condition.  Will get help right away if you are not doing well or get worse. Document Released: 06/05/2006 Document Revised: 11/20/2011 Document Reviewed: 05/18/2011 Exit[employer] Patient Information 2015 [employer]. This information is not intended to replace advice given to you by your health care provider. Make sure you discuss any questions you have with your health care provider.    

## 2014-04-02 NOTE — ED Provider Notes (Signed)
CSN: [id number]     Arrival date & time 04/02/14  1153 History   First MD Initiated Contact with Patient 04/02/14 1217     Chief Complaint  Patient presents with  . Dizziness     (Consider location/radiation/quality/duration/timing/severity/associated sxs/prior Treatment) HPI Comments: Patient is an 80 year old male with history of diabetes, hypertension, atrial flutter. He presents today with complaints of low heart rate and one episode of dizziness that occurred yesterday morning. He states that he woke up yesterday morning with a sensation that the room was spinning. He felt nauseated and this seemed to resolve within 30 minutes. He has had no further episodes. He has been watching his heart rate which he has measured as low as 48. He is quite active and exercises several times weekly and denies experiencing any similar episodes. He denies any chest pain or shortness of breath. He called the doctor's office today who recommended he come for evaluation for rule out of possible stroke. He tells me he feels completely normal and is here only because his wife and primary physician insisted.  Patient is a 80 y.o. male presenting with dizziness. The history is provided by the patient.  Dizziness Quality:  Head spinning Severity:  Moderate Onset quality:  Sudden Duration:  1 hour Timing:  Constant Progression:  Resolved Chronicity:  New Context: not when bending over, not with bowel movement and not with head movement   Relieved by:  Nothing Worsened by:  Nothing tried Ineffective treatments:  None tried Associated symptoms: no headaches     Past Medical History  Diagnosis Date  . Coronary atherosclerosis of unspecified type of vessel, native or graft   . Hypertension   . Hyperlipidemia   . Ulcer   . Diverticulosis of colon (without mention of hemorrhage)   . GERD (gastroesophageal reflux disease)   . Dizziness and giddiness   . Conductive hearing loss, external ear   .  Osteoarthrosis, unspecified whether generalized or localized, hand   . Family history of ischemic heart disease   . Personal history of urinary calculi   . Headache(784.0)   . Osteoarthrosis, unspecified whether generalized or localized, unspecified site   . Allergy   . Atrial flutter   . Hemorrhoids   . Renal calculi   . Diabetes mellitus without complication   . Gout attack 12/14/2012  . Diabetes 12/14/2012   Past Surgical History  Procedure Laterality Date  . Total knee arthroplasty    . Cataract extraction  2005  . Foot surgery  1949  . Vasectomy  1971  . Kidney stone surgery  1995   Family History  Problem Relation Age of Onset  . Heart disease Father   . Hypertension Mother   . Arthritis Mother   . Diabetes Other     runs in family  . Stroke Other    History  Substance Use Topics  . Smoking status: Former Smoker    Quit date: 09/11/1961  . Smokeless tobacco: Never Used  . Alcohol Use: No    Review of Systems  Neurological: Positive for dizziness. Negative for headaches.  All other systems reviewed and are negative.     Allergies  Hydromorphone hcl and Sulfonamide derivatives  Home Medications   Prior to Admission medications   Medication Sig Start Date End Date Taking? Authorizing Provider  aspirin EC 81 MG tablet Take 1 tablet (81 mg total) by mouth daily. 10/03/11   Liliane Shi, PA-C  atorvastatin (LIPITOR) 10 MG tablet Take 1  tablet (10 mg total) by mouth daily. 03/27/14   Ricard Dillon, MD  citalopram (CELEXA) 10 MG tablet Take 1 tablet (10 mg total) by mouth as needed. 09/08/11   Ricard Dillon, MD  clotrimazole-betamethasone (LOTRISONE) cream Apply topically 2 (two) times daily. Until rash resolves 05/28/12   Ricard Dillon, MD  dutasteride (AVODART) 0.5 MG capsule TAKE 1 CAPSULE DAILY 03/27/14   Ricard Dillon, MD  fish oil-omega-3 fatty acids 1000 MG capsule Take 4 capsules by mouth daily.     Historical Provider, MD  glucose blood (FREESTYLE  INSULINX TEST) test strip 1 each by Other route daily. Use as instructed 12/30/12   Ricard Dillon, MD  halobetasol (ULTRAVATE) 0.05 % cream Apply topically 2 (two) times daily. 07/02/12   Ricard Dillon, MD  Lancets (FREESTYLE) lancets USE ONE LANCET DAILY AS INSTRUCTED. 09/09/13   Ricard Dillon, MD  lisinopril (PRINIVIL,ZESTRIL) 5 MG tablet TAKE ONE TABLET BY MOUTH ONCE DAILY    Ricard Dillon, MD  loratadine (CLARITIN) 10 MG tablet Take 10 mg by mouth daily as needed. 09/08/11   Ricard Dillon, MD  multivitamin [facility]) per tablet Take 1 tablet by mouth daily.      Historical Provider, MD  nitroGLYCERIN (NITROSTAT) 0.4 MG SL tablet Place 0.4 mg under the tongue every 5 (five) minutes as needed. 03/28/11   [name], MD  SUMAtriptan (IMITREX) 25 MG tablet TAKE 1 TABLET EVERY 2 HOURSAS NEEDED 11/20/13   Ricard Dillon, MD  Tamsulosin HCl (FLOMAX) 0.4 MG CAPS Take 0.4 mg by mouth daily.    Historical Provider, MD  XARELTO 20 MG TABS TAKE ONE TABLET BY MOUTH DAILY WITH  SUPPER 04/08/13   Ricard Dillon, MD   BP 113/51  Pulse 76  Resp 14  SpO2 98% Physical Exam  Nursing note and vitals reviewed. Constitutional: He is oriented to person, place, and time. He appears well-developed and well-nourished. No distress.  HENT:  Head: Normocephalic and atraumatic.  Mouth/Throat: Oropharynx is clear and moist.  Eyes: EOM are normal. Pupils are equal, round, and reactive to light.  Neck: Normal range of motion. Neck supple.  Cardiovascular: Normal rate, regular rhythm and normal heart sounds.   No murmur heard. Pulmonary/Chest: Effort normal and breath sounds normal. No respiratory distress. He has no wheezes.  Abdominal: Soft. Bowel sounds are normal. He exhibits no distension. There is no tenderness.  Musculoskeletal: Normal range of motion. He exhibits no edema.  Lymphadenopathy:    He has no cervical adenopathy.  Neurological: He is alert and oriented to person, place, and time. No cranial  nerve deficit. He exhibits normal muscle tone. Coordination normal.  Skin: Skin is warm and dry. He is not diaphoretic.    ED Course  Procedures (including critical care time) Labs Review Labs Reviewed  CBC WITH DIFFERENTIAL  BASIC METABOLIC PANEL    Imaging Review No results found.   EKG Interpretation   Date/Time:  Thursday April 02 2014 12:02:06 EDT Ventricular Rate:  62 PR Interval:  191 QRS Duration: 146 QT Interval:  436 QTC Calculation: 443 R Axis:   17 Text Interpretation:  Sinus arrhythmia Right bundle branch block Confirmed  by DELOS  MD, Jacey Eckerson ([id number]) on 04/02/2014 12:17:52 PM      MDM   Final diagnoses:  None    Workup reveals normal laboratory studies. He describes a 30 minute episode of what sounds like vertigo that occurred yesterday morning. He has had  no further symptoms and I highly doubt a posterior circulation stroke. Patient appears clinically well and is neurologically intact and I do not feel as though an MRI is indicated. He is already taking Xarelto. He appears appropriate for discharge with when necessary followup if his symptoms worsen or change.    Veryl Speak, MD 04/02/14 1438

## 2014-04-09 ENCOUNTER — Other Ambulatory Visit: Payer: Self-pay | Admitting: Family Medicine

## 2014-04-09 ENCOUNTER — Ambulatory Visit (INDEPENDENT_AMBULATORY_CARE_PROVIDER_SITE_OTHER): Payer: Medicare Other | Admitting: Family Medicine

## 2014-04-09 ENCOUNTER — Encounter: Payer: Self-pay | Admitting: Family Medicine

## 2014-04-09 VITALS — BP 122/58 | HR 66 | Temp 97.6°F | Wt 182.0 lb

## 2014-04-09 DIAGNOSIS — R42 Dizziness and giddiness: Secondary | ICD-10-CM

## 2014-04-09 DIAGNOSIS — E119 Type 2 diabetes mellitus without complications: Secondary | ICD-10-CM

## 2014-04-09 DIAGNOSIS — N138 Other obstructive and reflux uropathy: Secondary | ICD-10-CM | POA: Diagnosis not present

## 2014-04-09 DIAGNOSIS — N401 Enlarged prostate with lower urinary tract symptoms: Secondary | ICD-10-CM

## 2014-04-09 DIAGNOSIS — E785 Hyperlipidemia, unspecified: Secondary | ICD-10-CM

## 2014-04-09 DIAGNOSIS — I10 Essential (primary) hypertension: Secondary | ICD-10-CM

## 2014-04-09 LAB — LIPID PANEL
Cholesterol: 122 mg/dL (ref 0–200)
HDL: 49.7 mg/dL (ref >39.00)
LDL Cholesterol: 54 mg/dL (ref 0–99)
NONHDL: 72.3
Total CHOL/HDL Ratio: 2
Triglycerides: 93 mg/dL (ref 0.0–149.0)
VLDL: 18.6 mg/dL (ref 0.0–40.0)

## 2014-04-09 LAB — HEPATIC FUNCTION PANEL
ALT: 13 U/L (ref 0–53)
AST: 16 U/L (ref 0–37)
Albumin: 4.1 g/dL (ref 3.5–5.2)
Alkaline Phosphatase: 67 U/L (ref 39–117)
BILIRUBIN TOTAL: 1.1 mg/dL (ref 0.2–1.2)
Bilirubin, Direct: 0.2 mg/dL (ref 0.0–0.3)
Total Protein: 6.7 g/dL (ref 6.0–8.3)

## 2014-04-09 LAB — HEMOGLOBIN A1C: Hgb A1c MFr Bld: 5.6 % (ref 4.6–6.5)

## 2014-04-09 LAB — TSH: TSH: 0.84 u[IU]/mL (ref 0.35–4.50)

## 2014-04-09 MED ORDER — ROSUVASTATIN CALCIUM 10 MG PO TABS: 10.0000 mg | ORAL_TABLET | Freq: Every day | ORAL | Status: DC

## 2014-04-09 NOTE — Assessment & Plan Note (Addendum)
Suspect may be element of orthostatic hypotension. D/c lisinopril with taper. Discussed flomax potentially contributing but given history of obstructive symptoms, patient states unlikely to tolerate coming off. Does sound like he had some vertigo and does admit to worse with turning head. ? BPPV but doubt. Follow up 1 month.

## 2014-04-09 NOTE — Assessment & Plan Note (Signed)
Requests to change to alternate statin to see if helps with myalgias. Check LFTs. Change to crestor (1 month supply). Can switch back if needed. Could also consider 40mg  simvastatin (wife's request) but held off for now in case patient were to need diltiazem or amiodarone in the future.

## 2014-04-09 NOTE — Assessment & Plan Note (Signed)
HR down into mid 40s with episode of vertigo. HR at 66 today. Not on beta blocker including eye drops. No changes today or further workup. Has cardiology follow up upcoming.

## 2014-04-09 NOTE — Patient Instructions (Addendum)
Check labs. Will send mychart message with results. Take crestor instead of atorvastatin for next month. Trial to see if this helps with muscle aches.   Stop lisinopril-take 1/2 tab for 3-4 days. Check blood pressure and send me readings early next week. We will stop completely if blood pressure not over 140/90.   Please come see [place] or seek care if you have recurrent episodes of the room spinning/lightheadedness. I doubt this was a TIA ("mini stroke"). May be BPPV (read below)  See me back in 1 month.   Health Maintenance Due  Topic Date Due  . Foot Exam  06/25/1940  . Ophthalmology Exam  06/25/1977  . Colonoscopy  06/25/1980  . Zostavax  06/25/1990  . Pneumococcal Polysaccharide Vaccine Age 78 And Over  06/26/1995  . Urine Microalbumin  01/17/2012    Benign Positional Vertigo Vertigo means you feel like you or your surroundings are moving when they are not. Benign positional vertigo is the most common form of vertigo. Benign means that the cause of your condition is not serious. Benign positional vertigo is more common in older adults. CAUSES  Benign positional vertigo is the result of an upset in the labyrinth system. This is an area in the middle ear that helps control your balance. This may be caused by a viral infection, head injury, or repetitive motion. However, often no specific cause is found. SYMPTOMS  Symptoms of benign positional vertigo occur when you move your head or eyes in different directions. Some of the symptoms may include:  Loss of balance and falls.  Vomiting.  Blurred vision.  Dizziness.  Nausea.  Involuntary eye movements (nystagmus). DIAGNOSIS  Benign positional vertigo is usually diagnosed by physical exam. If the specific cause of your benign positional vertigo is unknown, your caregiver may perform imaging tests, such as magnetic resonance imaging (MRI) or computed tomography (CT). TREATMENT  Your caregiver may recommend movements or procedures to  correct the benign positional vertigo. Medicines such as meclizine, benzodiazepines, and medicines for nausea may be used to treat your symptoms. In rare cases, if your symptoms are caused by certain conditions that affect the inner ear, you may need surgery. HOME CARE INSTRUCTIONS   Follow your caregiver's instructions.  Move slowly. Do not make sudden body or head movements.  Avoid driving.  Avoid operating heavy machinery.  Avoid performing any tasks that would be dangerous to you or others during a vertigo episode.  Drink enough fluids to keep your urine clear or pale yellow. SEEK IMMEDIATE MEDICAL CARE IF:   You develop problems with walking, weakness, numbness, or using your arms, hands, or legs.  You have difficulty speaking.  You develop severe headaches.  Your nausea or vomiting continues or gets worse.  You develop visual changes.  Your family or friends notice any behavioral changes.  Your condition gets worse.  You have a fever.  You develop a stiff neck or sensitivity to light. MAKE SURE YOU:   Understand these instructions.  Will watch your condition.  Will get help right away if you are not doing well or get worse. Document Released: 06/05/2006 Document Revised: 11/20/2011 Document Reviewed: 05/18/2011 [facility] Patient Information 2015 [place], [place]. This information is not intended to replace advice given to you by your health care provider. Make sure you discuss any questions you have with your health care provider.

## 2014-04-09 NOTE — Assessment & Plan Note (Signed)
Well controlled but with orthostatic symptoms (separate from recent vertigo). Do not see CHF or CKD so have discussed tapering off of lisinopril to see if this helps symptoms. Follow up in 3 weeks with Dr. Johnsie Cancel and me in 1 month or so.

## 2014-04-09 NOTE — Progress Notes (Signed)
Sean Reddish, MD Phone: [phone]  Subjective:  Patient presents today to establish care. Chief complaint-noted.   Vertigo Dizziness Hypertension Patient seen in ER on 7/23 for vertigo lasting 30 minutes. States pulse at time before seeking care was 42-44 and blood pressure 125/55. He sought care the following day in the emergency room. Unremarkable cbc, bmet at that time. Posterior circulation stroke thought highly unlikely and MRI not obtained. Continued on xarelto for atrial fibrillation and encouraged to follow up with primary care.   Patient states he had a similar episode a few days later lasting 10 minutes but no other episodes. Denies hearing changes or tinnitus. This has never happened to him before though he does complain each morning of orthostatic symptoms with standing (lgihtheadedness and needs time to regain his balance). Checked CBG during these times and was between 100-110.   ROS-no headache, extremity weakness, slurred speech or trouble swallowing, ataxia other than right after standing.  Denies any CP, HA, SOB, blurry vision, LE edema, transient weakness, orthopnea, PND.   Hyperlipidemia On statin: atorvastatin 10mg . Complains of myalgias on this medication diffusely. Wife was taken off of this previously and placed on simvastatin with complete resolution. They request to change medication. Patient is known to have history of CAD.  ROS- no chest pain or shortness of breath.   The following were reviewed and entered/updated in epic: Past Medical History  Diagnosis Date  . Coronary atherosclerosis of unspecified type of vessel, native or graft   . Hypertension   . Hyperlipidemia   . Ulcer   . Diverticulosis of colon (without mention of hemorrhage)   . GERD (gastroesophageal reflux disease)   . Dizziness and giddiness   . Conductive hearing loss, external ear   . Osteoarthrosis, unspecified whether generalized or localized, hand   . Family history of ischemic  heart disease   . Personal history of urinary calculi   . Headache(784.0)   . Osteoarthrosis, unspecified whether generalized or localized, unspecified site   . Allergy   . Atrial flutter   . Hemorrhoids   . Renal calculi   . Diabetes mellitus without complication   . Gout attack 12/14/2012  . Diabetes 12/14/2012  . BRONCHITIS, ACUTE WITH MILD BRONCHOSPASM 11/22/2009    Qualifier: Diagnosis of  By: Arnoldo Morale MD, Soldier ZOSTER 01/25/2009    Qualifier: Diagnosis of  By: Arnoldo Morale MD, Balinda Quails    Patient Active Problem List   Diagnosis Date Noted  . Diabetes 12/14/2012    Priority: High  . Atrial flutter 10/03/2011    Priority: High  . CAD 12/17/2007    Priority: High  . SINUS BRADYCARDIA 07/20/2010    Priority: Medium  . BENIGN PROSTATIC HYPERTROPHY, WITH OBSTRUCTION 06/08/2009    Priority: Medium  . DIZZINESS 12/15/2008    Priority: Medium  . HYPERLIPIDEMIA 04/02/2007    Priority: Medium  . HYPERTENSION 04/02/2007    Priority: Medium  . Gout attack 12/14/2012    Priority: Low  . Hematuria 03/28/2012    Priority: Low  . RBBB 12/28/2008    Priority: Low  . GERD 12/15/2008    Priority: Low  . PEPTIC ULCER DISEASE, HX OF 12/15/2008    Priority: Low  . LOSS, CONDUCTIVE HEARING, COMBINED TYPE 04/16/2007    Priority: Low  . ALLERGIC RHINITIS 04/02/2007    Priority: Low  . OSTEOARTHRITIS 04/02/2007    Priority: Low   Past Surgical History  Procedure Laterality Date  . Total knee arthroplasty    .  Cataract extraction  2005  . Foot surgery  1949  . Vasectomy  1971  . Kidney stone surgery  1995    Family History  Problem Relation Age of Onset  . Heart disease Father   . Hypertension Mother   . Arthritis Mother   . Diabetes Other     runs in family  . Stroke Other     Medications- reviewed and updated Current Outpatient Prescriptions  Medication Sig Dispense Refill  . aspirin EC 81 MG tablet Take 1 tablet (81 mg total) by mouth daily.      Marland Kitchen dutasteride  (AVODART) 0.5 MG capsule Take 0.5 mg by mouth daily.      . Glucose Blood (FREESTYLE INSULINX TEST VI) 1 each by Other route See admin instructions. Check blood sugar daily.      . Lancets (FREESTYLE) lancets 1 each by Other route See admin instructions. Check blood sugar once daily.      . multivitamin (THERAGRAN) per tablet Take 1 tablet by mouth daily.        . Omega-3 Fatty Acids (FISH OIL) 1200 MG CAPS Take 2,400 mg by mouth 2 (two) times daily.      Marland Kitchen OVER THE COUNTER MEDICATION Place 1-2 drops into both eyes daily as needed (for red eyes). "[facility] brand for red eyes"      . rivaroxaban (XARELTO) 20 MG TABS tablet Take 20 mg by mouth daily with supper.      . Tamsulosin HCl (FLOMAX) 0.4 MG CAPS Take 0.4 mg by mouth daily.      . citalopram (CELEXA) 10 MG tablet Take 1 tablet (10 mg total) by mouth as needed.  90 tablet  3  . ketoconazole (NIZORAL) 2 % cream Apply 1 application topically daily.      Marland Kitchen loratadine (CLARITIN) 10 MG tablet Take 10 mg by mouth daily as needed for allergies.       . nitroGLYCERIN (NITROSTAT) 0.4 MG SL tablet Place 0.4 mg under the tongue every 5 (five) minutes as needed.       Allergies-reviewed and updated Allergies  Allergen Reactions  . Hydromorphone Hcl Other (See Comments)    Goes nuts, very agitated.  . Sulfonamide Derivatives Swelling and Rash    History   Social History  . Marital Status: Married    Spouse Name: N/A    Number of Children: 1  . Years of Education: N/A   Occupational History  .     Social History Main Topics  . Smoking status: Former Smoker    Quit date: 09/11/1961  . Smokeless tobacco: Never Used  . Alcohol Use: No  . Drug Use: No  . Sexual Activity: Yes   Other Topics Concern  . None   Social History Narrative  . None    ROS--See HPI   Objective: BP 122/58  Pulse 66  Temp(Src) 97.6 F (36.4 C)  Wt 182 lb (82.555 kg) Gen: NAD, resting comfortably HEENT: Mucous membranes are moist. Oropharynx  normal Neck: no thyromegaly CV: RRR no murmurs rubs or gallops Lungs: CTAB no crackles, wheeze, rhonchi Abdomen: soft/nontender/nondistended/normal bowel sounds. No rebound or guarding.  Ext: no edema Skin: warm, dry Neuro: grossly normal, moves all extremities, PERRLA  Assessment/Plan:  CAD Due to history of CAD, I asked patient to discontinue use of sumatriptan and removed medication from his med list.   HYPERLIPIDEMIA Requests to change to alternate statin to see if helps with myalgias. Check LFTs. Change to crestor (1 month  supply). Can switch back if needed. Could also consider 40mg  simvastatin (wife's request) but held off for now in case patient were to need diltiazem or amiodarone in the future.   HYPERTENSION Well controlled but with orthostatic symptoms (separate from recent vertigo). Do not see CHF or CKD so have discussed tapering off of lisinopril to see if this helps symptoms. Follow up in 3 weeks with Dr. Johnsie Cancel and me in 1 month or so.   SINUS BRADYCARDIA HR down into mid 40s with episode of vertigo. HR at 66 today. Not on beta blocker including eye drops. No changes today or further workup. Has cardiology follow up upcoming.   DIZZINESS Suspect may be element of orthostatic hypotension. D/c lisinopril with taper. Discussed flomax potentially contributing but given history of obstructive symptoms, patient states unlikely to tolerate coming off. Does sound like he had some vertigo and does admit to worse with turning head. ? BPPV but doubt. Follow up 1 month.    When medications stable, plan to start 90 day Rx.   Orders Placed This Encounter  Procedures  . Hepatic Function Panel  . TSH    [place]  . Lipid panel    [place]    Order Specific Question:  Has the patient fasted?    Answer:  No  . Hemoglobin A1c    [place]    Meds ordered this encounter  Medications  . rosuvastatin (CRESTOR) 10 MG tablet    Sig: Take 1 tablet (10 mg total) by mouth daily.     Dispense:  30 tablet    Refill:  0

## 2014-04-09 NOTE — Assessment & Plan Note (Signed)
Due to history of CAD, I asked patient to discontinue use of sumatriptan and removed medication from his med list.

## 2014-04-16 ENCOUNTER — Encounter: Payer: Self-pay | Admitting: Family Medicine

## 2014-04-20 ENCOUNTER — Other Ambulatory Visit: Payer: Self-pay | Admitting: Cardiovascular Disease

## 2014-04-21 ENCOUNTER — Other Ambulatory Visit: Payer: Self-pay | Admitting: *Deleted

## 2014-04-21 MED ORDER — RIVAROXABAN 20 MG PO TABS: 20.0000 mg | ORAL_TABLET | Freq: Every day | ORAL | Status: DC

## 2014-04-21 MED ORDER — NITROGLYCERIN 0.4 MG SL SUBL: 0.4000 mg | SUBLINGUAL_TABLET | SUBLINGUAL | Status: DC | PRN

## 2014-04-27 DIAGNOSIS — M19049 Primary osteoarthritis, unspecified hand: Secondary | ICD-10-CM | POA: Diagnosis not present

## 2014-04-29 ENCOUNTER — Telehealth: Payer: Self-pay | Admitting: Pharmacist

## 2014-04-29 NOTE — Telephone Encounter (Signed)
Patient Pre-Visit Medication Review:   Medications/Allergies/Current pharmacy (both primary and secondary) reviewed with patient and/or informant.  Patient aware of appointment on 04/30/14 at 8:30 am with Dr. Johnsie Cancel.  Graceanna Theissen E. Kaleeah Gingerich, Pharm.D Clinical Pharmacy Resident Pager: [phone] 04/29/2014 11:42 AM

## 2014-04-30 ENCOUNTER — Ambulatory Visit (INDEPENDENT_AMBULATORY_CARE_PROVIDER_SITE_OTHER): Payer: Medicare Other | Admitting: Cardiovascular Disease

## 2014-04-30 VITALS — BP 118/58 | HR 60 | Ht 72.0 in | Wt 184.0 lb

## 2014-04-30 DIAGNOSIS — I10 Essential (primary) hypertension: Secondary | ICD-10-CM

## 2014-04-30 DIAGNOSIS — M62838 Other muscle spasm: Secondary | ICD-10-CM | POA: Diagnosis not present

## 2014-04-30 DIAGNOSIS — M999 Biomechanical lesion, unspecified: Secondary | ICD-10-CM | POA: Diagnosis not present

## 2014-04-30 DIAGNOSIS — M545 Low back pain, unspecified: Secondary | ICD-10-CM | POA: Diagnosis not present

## 2014-04-30 DIAGNOSIS — I2510 Atherosclerotic heart disease of native coronary artery without angina pectoris: Secondary | ICD-10-CM

## 2014-04-30 DIAGNOSIS — I4510 Unspecified right bundle-branch block: Secondary | ICD-10-CM | POA: Diagnosis not present

## 2014-04-30 DIAGNOSIS — E119 Type 2 diabetes mellitus without complications: Secondary | ICD-10-CM

## 2014-04-30 DIAGNOSIS — M546 Pain in thoracic spine: Secondary | ICD-10-CM | POA: Diagnosis not present

## 2014-04-30 DIAGNOSIS — E785 Hyperlipidemia, unspecified: Secondary | ICD-10-CM

## 2014-04-30 NOTE — Patient Instructions (Signed)
The current medical regimen is effective;  continue present plan and medications.  Follow up in 6 months with Dr Nishan.  You will receive a letter in the mail 2 months before you are due.  Please call us when you receive this letter to schedule your follow up appointment.   

## 2014-04-30 NOTE — Assessment & Plan Note (Signed)
Discussed low carb diet.  Target hemoglobin A1c is 6.5 or less.  Continue current medications.  

## 2014-04-30 NOTE — Assessment & Plan Note (Signed)
Stable with bradycardia no indication for pacer  ECG next visit

## 2014-04-30 NOTE — Assessment & Plan Note (Signed)
Well controlled.  Continue current medications and low sodium Dash type diet.    

## 2014-04-30 NOTE — Assessment & Plan Note (Signed)
Cholesterol is at goal.  Continue current dose of statin and diet Rx.  No myalgias or side effects.  F/U  LFT's in 6 months. Lab Results  Component Value Date   LDLCALC 54 04/09/2014

## 2014-04-30 NOTE — Assessment & Plan Note (Signed)
Stable with no angina and good activity level.  Continue medical Rx  

## 2014-04-30 NOTE — Progress Notes (Signed)
Patient ID: Sean Lane, male   DOB: [birth year], 80 y.o.   MRN: [id number] Sean Lane returns today for followup. He is a very pleasant 80 year old man with a history of atrial flutter, hypertension, and sinus bradycardia. The patient has maintaining sinus rhythm since last February. I had initially seen him in January and he was in atrial flutter with a controlled ventricular response. He spontaneously reverted to sinus rhythm. He was placed on anticoagulation with Coumadin. He denies chest pain or shortness of breath. His only complaint today is that he misses not being able to exercise. I asked him why he was not exercising and he was not certain but thought he has not been supposed to. No syncope and no peripheral edema. No chest pain.  Changed to xarelto end of June having microhematuria Sees Grapey for prostate  He has a history of CAD, s/p PCI to CFX in 2009 with Cypher DES x2, hypertension, hyperlipidemia, right bundle branch block, GERD, BPH. LHC 3/09: Mid RCA 50%, left main 20%, proximal LAD 40-50%, mid LAD 40%, proximal circumflex 80% and an 80% after OM2, left PDA 40%, EF 60%. PCI: Cypher DES x2 to the proximal and mid circumflex. Monitor in 2011 demonstrated NSR, PACs, right bundle branch block, no long pauses, no SVT, no high-grade heart block. Last exercise treadmill test 1/12: Heart rate increased appropriately, no ischemic EKG changes. Last seen by me 7/13 Since then has lost weight and developed DM. On meds now and trying to eat low carb diet Dizzyness somewhat postural  Discussed future need of pacer. Avoid all AV nodal blocking drugs. He exercises regularly and has good chronotropic response   Hands bothering him seeing Kuzma  ACE stopped and dizzyness a bit less  Home BP readings fine   ROS: Denies fever, malais, weight loss, blurry vision, decreased visual acuity, cough, sputum, SOB, hemoptysis, pleuritic pain, palpitaitons, heartburn, abdominal pain, melena, lower extremity  edema, claudication, or rash.  All other systems reviewed and negative  General: Affect appropriate not postural systoolic BP 700 sitting 174 standing  Healthy:  appears stated age HEENT: normal Neck supple with no adenopathy JVP normal no bruits no thyromegaly Lungs clear with no wheezing and good diaphragmatic motion Heart:  S1/S2 no murmur, no rub, gallop or click PMI normal Abdomen: benighn, BS positve, no tenderness, no AAA no bruit.  No HSM or HJR Distal pulses intact with no bruits No edema Neuro non-focal Skin warm and dry No muscular weakness   Current Outpatient Prescriptions  Medication Sig Dispense Refill  . aspirin EC 81 MG tablet Take 1 tablet (81 mg total) by mouth daily.      . citalopram (CELEXA) 10 MG tablet Take 1 tablet (10 mg total) by mouth as needed.  90 tablet  3  . dutasteride (AVODART) 0.5 MG capsule Take 0.5 mg by mouth daily.      . Glucose Blood (FREESTYLE INSULINX TEST VI) 1 each by Other route See admin instructions. Check blood sugar daily.      Marland Kitchen ketoconazole (NIZORAL) 2 % cream Apply 1 application topically daily.      . Lancets (FREESTYLE) lancets 1 each by Other route See admin instructions. Check blood sugar once daily.      Marland Kitchen loratadine (CLARITIN) 10 MG tablet Take 10 mg by mouth daily as needed for allergies.       . multivitamin (THERAGRAN) per tablet Take 1 tablet by mouth daily.        . nitroGLYCERIN (NITROSTAT)  0.4 MG SL tablet Place 1 tablet (0.4 mg total) under the tongue every 5 (five) minutes as needed.  25 tablet  4  . Omega-3 Fatty Acids (FISH OIL) 1200 MG CAPS Take 2,400 mg by mouth 2 (two) times daily.      Marland Kitchen OVER THE COUNTER MEDICATION Place 1-2 drops into both eyes daily as needed (for red eyes). "[facility] brand for red eyes"      . rivaroxaban (XARELTO) 20 MG TABS tablet Take 1 tablet (20 mg total) by mouth daily with supper.  30 tablet  3  . rosuvastatin (CRESTOR) 10 MG tablet Take 1 tablet (10 mg total) by mouth daily.  30  tablet  0  . Tamsulosin HCl (FLOMAX) 0.4 MG CAPS Take 0.4 mg by mouth daily.       No current facility-administered medications for this visit.    Allergies  Hydromorphone hcl and Sulfonamide derivatives  Electrocardiogram:  Sinus arrhythmia RBBB no high grade AV block  Assessment and Plan

## 2014-05-04 ENCOUNTER — Other Ambulatory Visit: Payer: Medicare Other

## 2014-05-04 DIAGNOSIS — M999 Biomechanical lesion, unspecified: Secondary | ICD-10-CM | POA: Diagnosis not present

## 2014-05-04 DIAGNOSIS — M545 Low back pain, unspecified: Secondary | ICD-10-CM | POA: Diagnosis not present

## 2014-05-04 DIAGNOSIS — M62838 Other muscle spasm: Secondary | ICD-10-CM | POA: Diagnosis not present

## 2014-05-04 DIAGNOSIS — M546 Pain in thoracic spine: Secondary | ICD-10-CM | POA: Diagnosis not present

## 2014-05-05 DIAGNOSIS — R3129 Other microscopic hematuria: Secondary | ICD-10-CM | POA: Diagnosis not present

## 2014-05-05 DIAGNOSIS — N200 Calculus of kidney: Secondary | ICD-10-CM | POA: Diagnosis not present

## 2014-05-05 DIAGNOSIS — R109 Unspecified abdominal pain: Secondary | ICD-10-CM | POA: Diagnosis not present

## 2014-05-06 DIAGNOSIS — M999 Biomechanical lesion, unspecified: Secondary | ICD-10-CM | POA: Diagnosis not present

## 2014-05-06 DIAGNOSIS — M545 Low back pain, unspecified: Secondary | ICD-10-CM | POA: Diagnosis not present

## 2014-05-06 DIAGNOSIS — M546 Pain in thoracic spine: Secondary | ICD-10-CM | POA: Diagnosis not present

## 2014-05-06 DIAGNOSIS — M62838 Other muscle spasm: Secondary | ICD-10-CM | POA: Diagnosis not present

## 2014-05-07 ENCOUNTER — Ambulatory Visit (INDEPENDENT_AMBULATORY_CARE_PROVIDER_SITE_OTHER): Payer: Medicare Other | Admitting: Family Medicine

## 2014-05-07 ENCOUNTER — Encounter: Payer: Self-pay | Admitting: Family Medicine

## 2014-05-07 VITALS — BP 135/69 | HR 60 | Temp 97.4°F | Wt 186.0 lb

## 2014-05-07 DIAGNOSIS — Z23 Encounter for immunization: Secondary | ICD-10-CM

## 2014-05-07 DIAGNOSIS — N200 Calculus of kidney: Secondary | ICD-10-CM | POA: Insufficient documentation

## 2014-05-07 DIAGNOSIS — E119 Type 2 diabetes mellitus without complications: Secondary | ICD-10-CM

## 2014-05-07 DIAGNOSIS — I10 Essential (primary) hypertension: Secondary | ICD-10-CM

## 2014-05-07 DIAGNOSIS — E785 Hyperlipidemia, unspecified: Secondary | ICD-10-CM | POA: Diagnosis not present

## 2014-05-07 MED ORDER — ROSUVASTATIN CALCIUM 10 MG PO TABS: 10.0000 mg | ORAL_TABLET | Freq: Every day | ORAL | Status: DC

## 2014-05-07 MED ORDER — AMOXICILLIN 500 MG PO TABS: 500.0000 mg | ORAL_TABLET | ORAL | Status: DC | PRN

## 2014-05-07 NOTE — Patient Instructions (Addendum)
Cholesterol-continue crestor. Glad the muscle aches are better.   Blood pressure-goal is for it to be less than 140/90. Call me if over 150 on top number more than 3 days or if over 140 more than 2 weeks and let's check back in.   Diabetes Health Maintenance Due  Topic Date Due  . Foot Exam - today, looked great 06/25/1940  . Ophthalmology Exam - have them fax records 06/25/1940  . 2nd pneumonia shot given today  06/26/1995  . Influenza Vaccine -today 04/11/2014   6 month f/u, Dr. Yong Channel

## 2014-05-07 NOTE — Assessment & Plan Note (Signed)
Diet and exercise controlled with last A1c at 5.6. Health maintenance updated. We'll need microalbumin to creatinine ratio yearly as now off of lisinopril.

## 2014-05-07 NOTE — Assessment & Plan Note (Signed)
Tolerating Crestor much better than Lipitor. Continue current medication.

## 2014-05-07 NOTE — Assessment & Plan Note (Addendum)
Well-controlled despite being off of lisinopril. This is now essentially diet and exercise controlled. Goal systolic less than 159 due to history diabetes. Dizziness has resolved off of lisinopril-this was likely orthostatic

## 2014-05-07 NOTE — Progress Notes (Signed)
Sean Reddish, MD Phone: [phone]  Subjective:   Sean Lane is a 80 y.o. year old very pleasant male patient who presents with the following:  Hypertension BP Readings from Last 3 Encounters:  05/07/14 135/69  04/30/14 118/58  04/09/14 122/58  taken off of lisinopril after last visit due to concern of dizziness. No dizziness or room spinning since last visit.  Home BP monitoring-typically 767H systolic Compliant with medications-yes without side effects ROS-Denies any CP, HA, SOB, blurry vision, LE edema.   Hyperlipidemia On statin: yes Feels generally better on crestor as compared to lipitor ROS- no chest pain or shortness of breath. No myalgias (have resolved)  Diabetes  Well-controlled after losing 60 pounds. No medications. Foot Exam today. Will have eye records faxed to our office. Agreeable to Prevnar ROS-no hypoglycemia  Past Medical History- Patient Active Problem List   Diagnosis Date Noted  . Diabetes 12/14/2012    Priority: High  . Atrial flutter 10/03/2011    Priority: High  . CAD 12/17/2007    Priority: High  . SINUS BRADYCARDIA 07/20/2010    Priority: Medium  . BENIGN PROSTATIC HYPERTROPHY, WITH OBSTRUCTION 06/08/2009    Priority: Medium  . DIZZINESS 12/15/2008    Priority: Medium  . HYPERLIPIDEMIA 04/02/2007    Priority: Medium  . HYPERTENSION 04/02/2007    Priority: Medium  . Nephrolithiasis 05/07/2014    Priority: Low  . Gout attack 12/14/2012    Priority: Low  . Hematuria 03/28/2012    Priority: Low  . RBBB 12/28/2008    Priority: Low  . GERD 12/15/2008    Priority: Low  . PEPTIC ULCER DISEASE, HX OF 12/15/2008    Priority: Low  . LOSS, CONDUCTIVE HEARING, COMBINED TYPE 04/16/2007    Priority: Low  . ALLERGIC RHINITIS 04/02/2007    Priority: Low  . OSTEOARTHRITIS 04/02/2007    Priority: Low   Medications- reviewed and updated Current Outpatient Prescriptions  Medication Sig Dispense Refill  . aspirin EC 81 MG tablet  Take 1 tablet (81 mg total) by mouth daily.      Marland Kitchen dutasteride (AVODART) 0.5 MG capsule Take 0.5 mg by mouth daily.      . Glucose Blood (FREESTYLE INSULINX TEST VI) 1 each by Other route See admin instructions. Check blood sugar daily.      Marland Kitchen ketoconazole (NIZORAL) 2 % cream Apply 1 application topically daily.      . Lancets (FREESTYLE) lancets 1 each by Other route See admin instructions. Check blood sugar once daily.      Marland Kitchen loratadine (CLARITIN) 10 MG tablet Take 10 mg by mouth daily as needed for allergies.       . multivitamin (THERAGRAN) per tablet Take 1 tablet by mouth daily.        . Omega-3 Fatty Acids (FISH OIL) 1200 MG CAPS Take 2,400 mg by mouth 2 (two) times daily.      Marland Kitchen OVER THE COUNTER MEDICATION Place 1-2 drops into both eyes daily as needed (for red eyes). "[facility] brand for red eyes"      . rivaroxaban (XARELTO) 20 MG TABS tablet Take 1 tablet (20 mg total) by mouth daily with supper.  30 tablet  3  . rosuvastatin (CRESTOR) 10 MG tablet Take 1 tablet (10 mg total) by mouth daily.  90 tablet  3  . Tamsulosin HCl (FLOMAX) 0.4 MG CAPS Take 0.4 mg by mouth daily.      Marland Kitchen amoxicillin (AMOXIL) 500 MG tablet Take 1 tablet (500 mg  total) by mouth as needed (dental prophylaxis).  8 tablet  0  . citalopram (CELEXA) 10 MG tablet Take 1 tablet (10 mg total) by mouth as needed.  90 tablet  3  . nitroGLYCERIN (NITROSTAT) 0.4 MG SL tablet Place 1 tablet (0.4 mg total) under the tongue every 5 (five) minutes as needed.  25 tablet  4  . traMADol (ULTRAM) 50 MG tablet Take 50 mg by mouth every 6 (six) hours as needed.       No current facility-administered medications for this visit.    Objective: BP 135/69  Pulse 60  Temp(Src) 97.4 F (36.3 C)  Wt 186 lb (84.369 kg) Gen: NAD, resting comfortably CV: RRR no murmurs rubs or gallops Lungs: CTAB no crackles, wheeze, rhonchi Ext: no edema, DM foot exam normal, pulses 1+ DP and PT  Assessment/Plan:  HYPERLIPIDEMIA Tolerating Crestor  much better than Lipitor. Continue current medication.  HYPERTENSION Well-controlled despite being off of lisinopril. This is now essentially diet and exercise controlled. Goal systolic less than 409 due to history diabetes. Dizziness has resolved off of lisinopril-this was likely orthostatic  Diabetes Diet and exercise controlled with last A1c at 5.6. Health maintenance updated. We'll need microalbumin to creatinine ratio yearly as now off of lisinopril.     Orders Placed This Encounter  Procedures  . Pneumococcal conjugate vaccine 13-valent    Meds ordered this encounter  Medications  . DISCONTD: amoxicillin (AMOXIL) 500 MG tablet    Sig: Take 500 mg by mouth as needed.  . traMADol (ULTRAM) 50 MG tablet    Sig: Take 50 mg by mouth every 6 (six) hours as needed.  Marland Kitchen DISCONTD: rosuvastatin (CRESTOR) 10 MG tablet    Sig: Take 1 tablet (10 mg total) by mouth daily.    Dispense:  30 tablet    Refill:  0  . rosuvastatin (CRESTOR) 10 MG tablet    Sig: Take 1 tablet (10 mg total) by mouth daily.    Dispense:  90 tablet    Refill:  3  . amoxicillin (AMOXIL) 500 MG tablet    Sig: Take 1 tablet (500 mg total) by mouth as needed (dental prophylaxis).    Dispense:  8 tablet    Refill:  0   amoxicillin-is to take 4 pills prior to procedure

## 2014-05-08 ENCOUNTER — Encounter: Payer: Self-pay | Admitting: Family Medicine

## 2014-05-08 ENCOUNTER — Ambulatory Visit: Payer: Medicare Other | Admitting: Internal Medicine

## 2014-05-08 NOTE — Progress Notes (Signed)
Pneumovax added

## 2014-05-22 ENCOUNTER — Other Ambulatory Visit: Payer: Self-pay | Admitting: Internal Medicine

## 2014-05-22 ENCOUNTER — Other Ambulatory Visit: Payer: Self-pay | Admitting: Family Medicine

## 2014-06-04 DIAGNOSIS — R310 Gross hematuria: Secondary | ICD-10-CM | POA: Diagnosis not present

## 2014-06-04 DIAGNOSIS — N200 Calculus of kidney: Secondary | ICD-10-CM | POA: Diagnosis not present

## 2014-06-10 ENCOUNTER — Other Ambulatory Visit: Payer: Self-pay

## 2014-06-10 MED ORDER — RIVAROXABAN 20 MG PO TABS: 20.0000 mg | ORAL_TABLET | Freq: Every day | ORAL | Status: DC

## 2014-06-19 ENCOUNTER — Telehealth: Payer: Self-pay | Admitting: Family Medicine

## 2014-06-19 MED ORDER — RIVAROXABAN 20 MG PO TABS: 20.0000 mg | ORAL_TABLET | Freq: Every day | ORAL | Status: DC

## 2014-06-19 NOTE — Telephone Encounter (Signed)
Pt request refill of the following: rivaroxaban (XARELTO) 20 MG TABS tablet  Pt request 90 day supply it is cheaper     Phamacy:  [facility]

## 2014-06-19 NOTE — Telephone Encounter (Signed)
Medication refilled

## 2014-07-27 ENCOUNTER — Telehealth: Payer: Self-pay | Admitting: Family Medicine

## 2014-07-27 NOTE — Telephone Encounter (Signed)
Refuse this please. You are right-Crestor should be used.

## 2014-07-27 NOTE — Telephone Encounter (Signed)
[facility] [place] is requesting 90 day re-fill on 10 mg Lipitor

## 2014-07-27 NOTE — Telephone Encounter (Signed)
I will put the re-fill request in the MD folder for you to view.

## 2014-07-27 NOTE — Telephone Encounter (Signed)
Dr. Yong Channel it looks like pt was taken off of Lipitor !0mg  in July of this year and placed on Crestor 10mg . Is this ok to refill the Lipitor or should i refuse it since he is now on Crestor 10mg ?

## 2014-07-27 NOTE — Telephone Encounter (Signed)
Pt is not on Lipitor, was this received in error?

## 2014-07-27 NOTE — Telephone Encounter (Signed)
Medication refused

## 2014-07-29 DIAGNOSIS — M1811 Unilateral primary osteoarthritis of first carpometacarpal joint, right hand: Secondary | ICD-10-CM | POA: Diagnosis not present

## 2014-07-29 DIAGNOSIS — M1812 Unilateral primary osteoarthritis of first carpometacarpal joint, left hand: Secondary | ICD-10-CM | POA: Diagnosis not present

## 2014-09-30 ENCOUNTER — Other Ambulatory Visit: Payer: Self-pay | Admitting: *Deleted

## 2014-09-30 MED ORDER — DUTASTERIDE 0.5 MG PO CAPS: 0.5000 mg | ORAL_CAPSULE | Freq: Every day | ORAL | Status: DC

## 2014-10-20 ENCOUNTER — Telehealth: Payer: Self-pay

## 2014-10-20 NOTE — Telephone Encounter (Signed)
[facility] refill request FREESTYLE INSULINX TEST STRIPS #100 (less than 5 refills only)

## 2014-10-21 MED ORDER — GLUCOSE BLOOD VI STRP: 1.0000 | ORAL_STRIP | Freq: Every day | Status: DC

## 2014-10-21 NOTE — Telephone Encounter (Signed)
Medication refilled

## 2014-10-27 NOTE — Progress Notes (Signed)
Patient ID: Sean Lane, male   DOB: 10-18-1927, 81 y.o.   MRN: [id number]   Mr. Winer returns today for followup. He is a very pleasant 81 y.o.  man with a history of atrial flutter, hypertension, and sinus bradycardia and CAD . The patient has maintaining sinus rhythm since last February. I had initially seen him in January 15  and he was in atrial flutter with a controlled ventricular response. He spontaneously reverted to sinus rhythm. He was placed on anticoagulation with Coumadin. He denies chest pain or shortness of breath  Changed to  Xarelto end of June having microhematuria Sees Grapey for prostate   He has a history of CAD, s/p PCI to CFX in 2009 with Cypher DES x2, hypertension, hyperlipidemia, right bundle branch block, GERD, BPH. LHC 3/09: Mid RCA 50%, left main 20%, proximal LAD 40-50%, mid LAD 40%, proximal circumflex 80% and an 80% after OM2, left PDA 40%, EF 60%. PCI: Cypher DES x2 to the proximal and mid circumflex. Monitor in 2011 demonstrated NSR, PACs, right bundle branch block, no long pauses, no SVT, no high-grade heart block. Last exercise treadmill test 1/12: Heart rate increased appropriately, no ischemic EKG changes.   Discussed future need of pacer. Avoid all AV nodal blocking drugs. He exercises regularly and has good chronotropic response  7/15  LDL 57    Hands bothering him seeing Kuzma  ACE stopped and dizzyness a bit less  Home BP readings fine  Last echo 2015 normal EF  Study Conclusions  - Left ventricle: The cavity size was normal. Systolic function was normal. The estimated ejection fraction was in the range of 55% to 60%. Wall motion was normal; there were no regional wall motion abnormalities. - Mitral valve: Mild regurgitation. - Left atrium: The atrium was mildly dilated. - Atrial septum: No defect or patent foramen ovale was identified.   ROS: Denies fever, malais, weight loss, blurry vision, decreased visual acuity, cough, sputum,  SOB, hemoptysis, pleuritic pain, palpitaitons, heartburn, abdominal pain, melena, lower extremity edema, claudication, or rash.  All other systems reviewed and negative  General: Affect appropriate not postural systoolic BP 631 sitting 497 standing  Healthy:  appears stated age HEENT: normal Neck supple with no adenopathy JVP normal no bruits no thyromegaly Lungs clear with no wheezing and good diaphragmatic motion Heart:  S1/S2 no murmur, no rub, gallop or click PMI normal Abdomen: benighn, BS positve, no tenderness, no AAA no bruit.  No HSM or HJR Distal pulses intact with no bruits No edema Neuro non-focal Skin warm and dry No muscular weakness   Current Outpatient Prescriptions  Medication Sig Dispense Refill  . amoxicillin (AMOXIL) 500 MG tablet Take 1 tablet (500 mg total) by mouth as needed (dental prophylaxis). 8 tablet 0  . aspirin EC 81 MG tablet Take 1 tablet (81 mg total) by mouth daily.    . citalopram (CELEXA) 10 MG tablet Take 1 tablet (10 mg total) by mouth as needed. 90 tablet 3  . CRESTOR 10 MG tablet TAKE ONE TABLET BY MOUTH DAILY 30 tablet 2  . dutasteride (AVODART) 0.5 MG capsule Take 1 capsule (0.5 mg total) by mouth daily. 90 capsule 1  . glucose blood (FREESTYLE INSULINX TEST) test strip 1 each by Other route daily. Test blood sugar daily. Dx: E11.9 100 each 2  . ketoconazole (NIZORAL) 2 % cream Apply 1 application topically daily.    . Lancets (FREESTYLE) lancets 1 each by Other route See admin instructions. Check blood  sugar once daily.    Marland Kitchen loratadine (CLARITIN) 10 MG tablet Take 10 mg by mouth daily as needed for allergies.     . multivitamin (THERAGRAN) per tablet Take 1 tablet by mouth daily.      . nitroGLYCERIN (NITROSTAT) 0.4 MG SL tablet Place 1 tablet (0.4 mg total) under the tongue every 5 (five) minutes as needed. 25 tablet 4  . Omega-3 Fatty Acids (FISH OIL) 1200 MG CAPS Take 2,400 mg by mouth 2 (two) times daily.    Marland Kitchen OVER THE COUNTER  MEDICATION Place 1-2 drops into both eyes daily as needed (for red eyes). "[facility] brand for red eyes"    . rivaroxaban (XARELTO) 20 MG TABS tablet Take 1 tablet (20 mg total) by mouth daily with supper. 90 tablet 1  . rosuvastatin (CRESTOR) 10 MG tablet Take 1 tablet (10 mg total) by mouth daily. 90 tablet 3  . Tamsulosin HCl (FLOMAX) 0.4 MG CAPS Take 0.4 mg by mouth daily.    . traMADol (ULTRAM) 50 MG tablet Take 50 mg by mouth every 6 (six) hours as needed.     No current facility-administered medications for this visit.    Allergies  Hydromorphone hcl and Sulfonamide derivatives  Electrocardiogram:  Sinus arrhythmia RBBB no high grade AV block 04/30/14   Assessment and Plan

## 2014-10-28 ENCOUNTER — Ambulatory Visit (INDEPENDENT_AMBULATORY_CARE_PROVIDER_SITE_OTHER): Payer: Medicare Other | Admitting: Cardiovascular Disease

## 2014-10-28 ENCOUNTER — Encounter: Payer: Self-pay | Admitting: Cardiovascular Disease

## 2014-10-28 VITALS — BP 120/68 | HR 68 | Ht 73.0 in | Wt 185.4 lb

## 2014-10-28 DIAGNOSIS — I495 Sick sinus syndrome: Secondary | ICD-10-CM | POA: Diagnosis not present

## 2014-10-28 DIAGNOSIS — I10 Essential (primary) hypertension: Secondary | ICD-10-CM

## 2014-10-28 DIAGNOSIS — E119 Type 2 diabetes mellitus without complications: Secondary | ICD-10-CM | POA: Diagnosis not present

## 2014-10-28 DIAGNOSIS — I2510 Atherosclerotic heart disease of native coronary artery without angina pectoris: Secondary | ICD-10-CM

## 2014-10-28 DIAGNOSIS — E785 Hyperlipidemia, unspecified: Secondary | ICD-10-CM

## 2014-10-28 NOTE — Patient Instructions (Signed)
Your physician wants you to follow-up in:  6 MONTHS WITH DR NISHAN  You will receive a reminder letter in the mail two months in advance. If you don't receive a letter, please call our office to schedule the follow-up appointment. Your physician recommends that you continue on your current medications as directed. Please refer to the Current Medication list given to you today. 

## 2014-10-28 NOTE — Assessment & Plan Note (Signed)
Stable with no angina and good activity level.  Continue medical Rx Working out at [employer] 4x/week carries nitro with him

## 2014-10-28 NOTE — Assessment & Plan Note (Signed)
Discussed low carb diet.  Target hemoglobin A1c is 6.5 or less.  Continue current medications.  

## 2014-10-28 NOTE — Assessment & Plan Note (Signed)
Stable no presyncope avoid AV nodal blocking drugs  ECG f/u next visit

## 2014-10-28 NOTE — Assessment & Plan Note (Signed)
Cholesterol is at goal.  Continue current dose of statin and diet Rx.  No myalgias or side effects.  F/U  LFT's in 6 months. Lab Results  Component Value Date   LDLCALC 54 04/09/2014

## 2014-10-28 NOTE — Assessment & Plan Note (Signed)
Well controlled.  Continue current medications and low sodium Dash type diet.    

## 2014-10-30 ENCOUNTER — Ambulatory Visit: Payer: Medicare Other | Admitting: Family Medicine

## 2014-11-03 ENCOUNTER — Ambulatory Visit: Payer: Medicare Other | Admitting: Family Medicine

## 2014-11-10 ENCOUNTER — Encounter: Payer: Self-pay | Admitting: Family Medicine

## 2014-11-10 ENCOUNTER — Ambulatory Visit (INDEPENDENT_AMBULATORY_CARE_PROVIDER_SITE_OTHER): Payer: Medicare Other | Admitting: Family Medicine

## 2014-11-10 VITALS — BP 130/64 | Temp 97.4°F | Wt 186.0 lb

## 2014-11-10 DIAGNOSIS — E119 Type 2 diabetes mellitus without complications: Secondary | ICD-10-CM

## 2014-11-10 LAB — HEMOGLOBIN A1C: HEMOGLOBIN A1C: 5.6 % (ref 4.6–6.5)

## 2014-11-10 LAB — MICROALBUMIN / CREATININE URINE RATIO
Creatinine,U: 44.5 mg/dL
Microalb Creat Ratio: 1.6 mg/g (ref 0.0–30.0)

## 2014-11-10 NOTE — Progress Notes (Signed)
  [name], MD Phone: [phone]  Subjective:   Sean Lane is a 81 y.o. year old very pleasant male patient who presents with the following:  Diabetes mellitus II, diet and exercise controlled follow up Lab Results  Component Value Date   HGBA1C 5.6 04/09/2014  fasting CBG 91- 119 at highest depending on meals.  ROS-denies hypoglycemia or blurry vision. No chest pain, shortness of breath.   Past Medical History-DM, hx a flutter on xarelto, CAD, sinus brady, BPH, HLD, HTN, GOUT hx  Medications- reviewed and updated Current Outpatient Prescriptions  Medication Sig Dispense Refill  . aspirin EC 81 MG tablet Take 1 tablet (81 mg total) by mouth daily.    . CRESTOR 10 MG tablet TAKE ONE TABLET BY MOUTH DAILY 30 tablet 2  . dutasteride (AVODART) 0.5 MG capsule Take 1 capsule (0.5 mg total) by mouth daily. 90 capsule 1  . glucose blood (FREESTYLE INSULINX TEST) test strip 1 each by Other route daily. Test blood sugar daily. Dx: E11.9 100 each 2  . Lancets (FREESTYLE) lancets 1 each by Other route See admin instructions. Check blood sugar once daily.    Marland Kitchen loratadine (CLARITIN) 10 MG tablet Take 10 mg by mouth daily as needed for allergies.     . multivitamin (THERAGRAN) per tablet Take 1 tablet by mouth daily.      . Omega-3 Fatty Acids (FISH OIL) 1200 MG CAPS Take 2,400 mg by mouth 2 (two) times daily.    Marland Kitchen OVER THE COUNTER MEDICATION Place 1-2 drops into both eyes daily as needed (for red eyes). "[facility] brand for red eyes"    . rivaroxaban (XARELTO) 20 MG TABS tablet Take 1 tablet (20 mg total) by mouth daily with supper. 90 tablet 1  . rosuvastatin (CRESTOR) 10 MG tablet Take 1 tablet (10 mg total) by mouth daily. 90 tablet 3  . Tamsulosin HCl (FLOMAX) 0.4 MG CAPS Take 0.4 mg by mouth daily.    Marland Kitchen amoxicillin (AMOXIL) 500 MG tablet Take 1 tablet (500 mg total) by mouth as needed (dental prophylaxis). (Patient not taking: Reported on 11/10/2014) 8 tablet 0  . nitroGLYCERIN  (NITROSTAT) 0.4 MG SL tablet Place 1 tablet (0.4 mg total) under the tongue every 5 (five) minutes as needed. (Patient not taking: Reported on 11/10/2014) 25 tablet 4   Objective: BP 130/64 mmHg  Temp(Src) 97.4 F (36.3 C)  Wt 186 lb (84.369 kg) Gen: NAD, resting comfortably in chair CV: RRR no murmurs rubs or gallops Lungs: CTAB no crackles, wheeze, rhonchi Abdomen: soft/nontender/nondistended/normal bowel sounds. Ext: no edema Skin: warm, dry, a few small papules on legs erythematous (previous eval by derm-has steroid cream)   Assessment/Plan:  Diabetes Sent for a1c and microalbumin/cr ratio. Suspect continued excellent control. No medication   6 month f/u. Requested optho report.   Orders Placed This Encounter  Procedures  . Hemoglobin A1c    [place]  . Microalbumin / creatinine urine ratio    [place]

## 2014-11-10 NOTE — Assessment & Plan Note (Signed)
Sent for a1c and microalbumin/cr ratio. Suspect continued excellent control. No medication

## 2014-11-10 NOTE — Patient Instructions (Addendum)
A1c today. Urine test to check on diabetes as well.   Sign release of information at the front desk for your last diabetic eye exam. If you havent had one in a year, have them send [place] your next copy.   Otherwise I think you are doing really well

## 2014-11-23 DIAGNOSIS — E119 Type 2 diabetes mellitus without complications: Secondary | ICD-10-CM | POA: Diagnosis not present

## 2014-11-23 LAB — HM DIABETES EYE EXAM

## 2014-11-25 DIAGNOSIS — M5440 Lumbago with sciatica, unspecified side: Secondary | ICD-10-CM | POA: Diagnosis not present

## 2014-11-25 DIAGNOSIS — M9903 Segmental and somatic dysfunction of lumbar region: Secondary | ICD-10-CM | POA: Diagnosis not present

## 2014-11-25 DIAGNOSIS — M6240 Contracture of muscle, unspecified site: Secondary | ICD-10-CM | POA: Diagnosis not present

## 2014-11-25 DIAGNOSIS — M9904 Segmental and somatic dysfunction of sacral region: Secondary | ICD-10-CM | POA: Diagnosis not present

## 2014-11-25 DIAGNOSIS — M546 Pain in thoracic spine: Secondary | ICD-10-CM | POA: Diagnosis not present

## 2014-11-26 DIAGNOSIS — M9903 Segmental and somatic dysfunction of lumbar region: Secondary | ICD-10-CM | POA: Diagnosis not present

## 2014-11-26 DIAGNOSIS — M546 Pain in thoracic spine: Secondary | ICD-10-CM | POA: Diagnosis not present

## 2014-11-26 DIAGNOSIS — M5440 Lumbago with sciatica, unspecified side: Secondary | ICD-10-CM | POA: Diagnosis not present

## 2014-11-26 DIAGNOSIS — M6240 Contracture of muscle, unspecified site: Secondary | ICD-10-CM | POA: Diagnosis not present

## 2014-11-26 DIAGNOSIS — M9904 Segmental and somatic dysfunction of sacral region: Secondary | ICD-10-CM | POA: Diagnosis not present

## 2014-12-03 ENCOUNTER — Encounter: Payer: Self-pay | Admitting: Family Medicine

## 2014-12-13 ENCOUNTER — Other Ambulatory Visit: Payer: Self-pay | Admitting: Family Medicine

## 2014-12-16 ENCOUNTER — Telehealth: Payer: Self-pay | Admitting: *Deleted

## 2014-12-16 MED ORDER — DUTASTERIDE 0.5 MG PO CAPS: 0.5000 mg | ORAL_CAPSULE | Freq: Every day | ORAL | Status: DC

## 2014-12-16 NOTE — Telephone Encounter (Signed)
Refill sumatriptan 25 mg 1 tablet every 2 hours prn.  Not on med list [facility]

## 2014-12-16 NOTE — Telephone Encounter (Signed)
No refill. Not the best option with history of heart disease. Would need visit and likely would not use this option.

## 2014-12-16 NOTE — Telephone Encounter (Signed)
Spoke with pt's wife, [name], who is listed on his DPR. She is aware of Sean Lane's note and says they will discuss it at his next OV

## 2015-01-14 ENCOUNTER — Ambulatory Visit (INDEPENDENT_AMBULATORY_CARE_PROVIDER_SITE_OTHER): Payer: Medicare Other | Admitting: Family Medicine

## 2015-01-14 ENCOUNTER — Telehealth: Payer: Self-pay | Admitting: Family Medicine

## 2015-01-14 ENCOUNTER — Encounter: Payer: Self-pay | Admitting: Family Medicine

## 2015-01-14 VITALS — BP 140/68 | HR 61 | Temp 97.6°F | Wt 183.0 lb

## 2015-01-14 DIAGNOSIS — A084 Viral intestinal infection, unspecified: Secondary | ICD-10-CM | POA: Diagnosis not present

## 2015-01-14 DIAGNOSIS — K219 Gastro-esophageal reflux disease without esophagitis: Secondary | ICD-10-CM | POA: Diagnosis not present

## 2015-01-14 MED ORDER — OMEPRAZOLE 40 MG PO CPDR: 40.0000 mg | DELAYED_RELEASE_CAPSULE | Freq: Every day | ORAL | Status: DC

## 2015-01-14 NOTE — Patient Instructions (Addendum)
Sounds like you had a stomach bug.   Unclear why this led to reflux symptoms but that seems to be the primary issue you have right now  I am going to use ulcer level medicine to try to help. Take omeprazole 40mg  for minimum of 2 weeks. If completely resolves symptoms, may stop. If not 75% better by the 2 weeks, take for 4 weeks. If not better in a month, come see me. If you have new or worsening symptoms especially if there was an exertional component or if you had chest pain or shortness of breath then come see [place].

## 2015-01-14 NOTE — Progress Notes (Addendum)
[name], MD  Subjective:  Sean Lane is a 81 y.o. year old very pleasant male patient who presents with:  Diarrhea/fever improved Reflux symptoms persisting -started Sunday with stomach irritation, Sunday evening started to have a fever  Into the 100s. Decreased appetite, desire to eat. Nauseous. Fevers continued into next day. Slept 6-7 hours in the daytime. No vomiting. Had diarrhea 2-3 x. Had a feeling like something was moving through his bowel. Has had an distended abdomen. Fever stopped by end of Monday.  Primary persistent symptom has been an upper throat irritation, burning sensation. Sleeping on two pillows as he seems to have reflux symptoms. Overall feels slightly better but not back to himself yet. As reflux symptoms make it hard to sleep. No history acid reflux. History of H. Pylori over 20 years ago.   ROS- no abdominal pain, no blood in stool, no dark black stool. No chest pain or shortness of breath.   Past Medical History- CAD, a flutter, Diabetes, HLD, HTN, BPH  Medications- reviewed and updated Current Outpatient Prescriptions  Medication Sig Dispense Refill  . aspirin EC 81 MG tablet Take 1 tablet (81 mg total) by mouth daily.    . CRESTOR 10 MG tablet TAKE ONE TABLET BY MOUTH DAILY 30 tablet 2  . dutasteride (AVODART) 0.5 MG capsule Take 1 capsule (0.5 mg total) by mouth daily. 90 capsule 1  . glucose blood (FREESTYLE INSULINX TEST) test strip 1 each by Other route daily. Test blood sugar daily. Dx: E11.9 100 each 2  . Lancets (FREESTYLE) lancets 1 each by Other route See admin instructions. Check blood sugar once daily.    Marland Kitchen loratadine (CLARITIN) 10 MG tablet Take 10 mg by mouth daily as needed for allergies.     . multivitamin (THERAGRAN) per tablet Take 1 tablet by mouth daily.      . Omega-3 Fatty Acids (FISH OIL) 1200 MG CAPS Take 2,400 mg by mouth 2 (two) times daily.    Marland Kitchen OVER THE COUNTER MEDICATION Place 1-2 drops into both eyes daily as needed  (for red eyes). "[facility] brand for red eyes"    . rosuvastatin (CRESTOR) 10 MG tablet Take 1 tablet (10 mg total) by mouth daily. 90 tablet 3  . Tamsulosin HCl (FLOMAX) 0.4 MG CAPS Take 0.4 mg by mouth daily.    Alveda Reasons 20 MG TABS tablet TAKE 1 TABLET DAILY WITH   SUPPER 90 tablet 2  . nitroGLYCERIN (NITROSTAT) 0.4 MG SL tablet Place 1 tablet (0.4 mg total) under the tongue every 5 (five) minutes as needed. (Patient not taking: Reported on 11/10/2014) 25 tablet 4   No current facility-administered medications for this visit.    Objective: BP 140/68 mmHg  Pulse 61  Temp(Src) 97.6 F (36.4 C)  Wt 183 lb (83.008 kg) Gen: NAD, resting comfortably Mucous membranes are moist. Irregular pupils based on prior surgery. TM normal. Oropharynx normal.  CV: RRR no murmurs rubs or gallops Lungs: CTAB no crackles, wheeze, rhonchi Abdomen: soft/very slight LUQ tenderness/nondistended/normal bowel sounds. No rebound or guarding.  Ext: no edema Skin: warm, dry, no rash   Assessment/Plan:  Diarrhea/fever improved- suspect had gastroenteritis now improving Reflux symptoms persisting- symptoms consistent with acid reflux/GERD (burning in upper throat especially after meals and when lays down at night, improved with sleeping more upright but without orthopnea). Unclear why gastroenteritis triggered this. History H. Pylori but not recently. Plan to treat with PPI to 2-4 weeks and if not completely resolved, consider further  workup.    Meds ordered this encounter  Medications  . omeprazole (PRILOSEC) 40 MG capsule    Sig: Take 1 capsule (40 mg total) by mouth daily.    Dispense:  30 capsule    Refill:  0

## 2015-01-14 NOTE — Telephone Encounter (Signed)
Received PA request for omeprazole 40 mg.  I called patient's plan and was advised BRAND Nexium 40 and omeprazole 20 mg- 60 per 30 are approved alternatives through patients plan.

## 2015-01-14 NOTE — Telephone Encounter (Signed)
See below

## 2015-01-14 NOTE — Telephone Encounter (Signed)
Ok to change to take two 20mg  tablets of omeprazole per day. #60 would cover this for one month.

## 2015-01-15 ENCOUNTER — Telehealth: Payer: Self-pay

## 2015-01-15 MED ORDER — FREESTYLE LANCETS MISC: Status: DC

## 2015-01-15 MED ORDER — OMEPRAZOLE 20 MG PO CPDR: 20.0000 mg | DELAYED_RELEASE_CAPSULE | Freq: Two times a day (BID) | ORAL | Status: DC

## 2015-01-15 NOTE — Telephone Encounter (Signed)
[facility] request for Lancets (FREESTYLE) lancets

## 2015-01-15 NOTE — Telephone Encounter (Signed)
Medication refilled

## 2015-01-15 NOTE — Telephone Encounter (Signed)
Medication changed

## 2015-01-18 ENCOUNTER — Other Ambulatory Visit: Payer: Self-pay | Admitting: *Deleted

## 2015-01-18 MED ORDER — FREESTYLE LANCETS MISC: Status: DC

## 2015-01-18 NOTE — Telephone Encounter (Signed)
Rx sent 

## 2015-02-15 ENCOUNTER — Telehealth: Payer: Self-pay | Admitting: *Deleted

## 2015-02-15 ENCOUNTER — Other Ambulatory Visit: Payer: Self-pay

## 2015-02-15 MED ORDER — AMOXICILLIN 500 MG PO TABS: 500.0000 mg | ORAL_TABLET | ORAL | Status: DC | PRN

## 2015-02-15 NOTE — Telephone Encounter (Signed)
Ok to refill 

## 2015-02-15 NOTE — Telephone Encounter (Signed)
Patient is requesting a refill of Amoxicillin 500 mg, take four capsules by mouth one hour before appointment, [phone], [phone]

## 2015-02-15 NOTE — Telephone Encounter (Signed)
Medication filled.  

## 2015-02-15 NOTE — Telephone Encounter (Signed)
Yes ok to fill

## 2015-02-15 NOTE — Addendum Note (Signed)
Addended by: Clyde Lundborg A on: 02/15/2015 12:48 PM   Modules accepted: Orders

## 2015-02-16 ENCOUNTER — Other Ambulatory Visit: Payer: Self-pay

## 2015-03-18 ENCOUNTER — Encounter: Payer: Self-pay | Admitting: Gastroenterology

## 2015-04-26 ENCOUNTER — Ambulatory Visit: Payer: PRIVATE HEALTH INSURANCE | Admitting: Cardiovascular Disease

## 2015-05-04 NOTE — Progress Notes (Signed)
Patient ID: Sean Lane, male   DOB: [birth year], 81 y.o.   MRN: [id number]   Sean Lane returns today for followup. He is a very pleasant 81 y.o.  man with a history of atrial flutter, hypertension, and sinus bradycardia and CAD . The patient has maintaining sinus rhythm since last February. I had initially seen him in January 15  and he was in atrial flutter with a controlled ventricular response. He spontaneously reverted to sinus rhythm. He was placed on anticoagulation with Coumadin. He denies chest pain or shortness of breath  Changed to  Xarelto end of June having microhematuria Sees Grapey for prostate   He has a history of CAD, s/p PCI to CFX in 2009 with Cypher DES x2, hypertension, hyperlipidemia, right bundle branch block, GERD, BPH. LHC 3/09: Mid RCA 50%, left main 20%, proximal LAD 40-50%, mid LAD 40%, proximal circumflex 80% and an 80% after OM2, left PDA 40%, EF 60%. PCI: Cypher DES x2 to the proximal and mid circumflex. Monitor in 2011 demonstrated NSR, PACs, right bundle branch block, no long pauses, no SVT, no high-grade heart block. Last exercise treadmill test 1/12: Heart rate increased appropriately, no ischemic EKG changes.   Discussed future need of pacer. Avoid all AV nodal blocking drugs. He exercises regularly and has good chronotropic response  7/15  LDL 57    Hands bothering him seeing Kuzma  ACE stopped and dizzyness a bit less  Home BP readings fine  Last echo 2015 normal EF  Study Conclusions  - Left ventricle: The cavity size was normal. Systolic function was normal. The estimated ejection fraction was in the range of 55% to 60%. Wall motion was normal; there were no regional wall motion abnormalities. - Mitral valve: Mild regurgitation. - Left atrium: The atrium was mildly dilated. - Atrial septum: No defect or patent foramen ovale was identified.   ROS: Denies fever, malais, weight loss, blurry vision, decreased visual acuity, cough, sputum,  SOB, hemoptysis, pleuritic pain, palpitaitons, heartburn, abdominal pain, melena, lower extremity edema, claudication, or rash.  All other systems reviewed and negative  General: Affect appropriate not postural systoolic BP 626 sitting 948 standing  Healthy:  appears stated age HEENT: normal Neck supple with no adenopathy JVP normal no bruits no thyromegaly Lungs clear with no wheezing and good diaphragmatic motion Heart:  S1/S2 no murmur, no rub, gallop or click PMI normal Abdomen: benighn, BS positve, no tenderness, no AAA no bruit.  No HSM or HJR Distal pulses intact with no bruits No edema Neuro non-focal Skin warm and dry No muscular weakness   Current Outpatient Prescriptions  Medication Sig Dispense Refill  . amoxicillin (AMOXIL) 500 MG tablet Take 1 tablet (500 mg total) by mouth as needed (dental prophylaxis). 8 tablet 0  . aspirin EC 81 MG tablet Take 1 tablet (81 mg total) by mouth daily.    . CRESTOR 10 MG tablet TAKE ONE TABLET BY MOUTH DAILY 30 tablet 2  . dutasteride (AVODART) 0.5 MG capsule Take 1 capsule (0.5 mg total) by mouth daily. 90 capsule 1  . glucose blood (FREESTYLE INSULINX TEST) test strip 1 each by Other route daily. Test blood sugar daily. Dx: E11.9 100 each 2  . Lancets (FREESTYLE) lancets Check blood sugar once daily. 100 each 4  . loratadine (CLARITIN) 10 MG tablet Take 10 mg by mouth daily as needed for allergies.     . multivitamin (THERAGRAN) per tablet Take 1 tablet by mouth daily.      Marland Kitchen  nitroGLYCERIN (NITROSTAT) 0.4 MG SL tablet Place 1 tablet (0.4 mg total) under the tongue every 5 (five) minutes as needed. (Patient not taking: Reported on 11/10/2014) 25 tablet 4  . Omega-3 Fatty Acids (FISH OIL) 1200 MG CAPS Take 2,400 mg by mouth 2 (two) times daily.    Marland Kitchen omeprazole (PRILOSEC) 20 MG capsule Take 1 capsule (20 mg total) by mouth 2 (two) times daily before a meal. 60 capsule 5  . OVER THE COUNTER MEDICATION Place 1-2 drops into both eyes daily as  needed (for red eyes). "[facility] brand for red eyes"    . rosuvastatin (CRESTOR) 10 MG tablet Take 1 tablet (10 mg total) by mouth daily. 90 tablet 3  . Tamsulosin HCl (FLOMAX) 0.4 MG CAPS Take 0.4 mg by mouth daily.    Alveda Reasons 20 MG TABS tablet TAKE 1 TABLET DAILY WITH   SUPPER 90 tablet 2   No current facility-administered medications for this visit.    Allergies  Hydromorphone hcl and Sulfonamide derivatives  Electrocardiogram:  Sinus arrhythmia RBBB no high grade AV block 04/30/14   Assessment and Plan CAD: Stable with no angina and good activity level.  Continue medical Rx  PAF: maint NSR continue xarelto   Anticoagulation: renal function good no bleeding issues continue xarelto  SSS: monitor with no pauses.  Baseline RBBB  ETT with adequate HR response  Observe  HTN Well controlled.  Continue current medications and low sodium Dash type diet.    Chol:

## 2015-05-05 ENCOUNTER — Ambulatory Visit (INDEPENDENT_AMBULATORY_CARE_PROVIDER_SITE_OTHER): Payer: Medicare Other | Admitting: Cardiovascular Disease

## 2015-05-05 ENCOUNTER — Encounter: Payer: Self-pay | Admitting: Cardiovascular Disease

## 2015-05-05 VITALS — BP 124/60 | HR 58 | Ht 73.0 in | Wt 183.2 lb

## 2015-05-05 DIAGNOSIS — I2510 Atherosclerotic heart disease of native coronary artery without angina pectoris: Secondary | ICD-10-CM | POA: Diagnosis not present

## 2015-05-05 DIAGNOSIS — I10 Essential (primary) hypertension: Secondary | ICD-10-CM

## 2015-05-05 DIAGNOSIS — R079 Chest pain, unspecified: Secondary | ICD-10-CM

## 2015-05-05 MED ORDER — NITROGLYCERIN 0.4 MG SL SUBL: 0.4000 mg | SUBLINGUAL_TABLET | SUBLINGUAL | Status: DC | PRN

## 2015-05-05 NOTE — Patient Instructions (Signed)
Your physician wants you to follow-up in:  6 MONTHS WITH DR NISHAN  You will receive a reminder letter in the mail two months in advance. If you don't receive a letter, please call our office to schedule the follow-up appointment. Your physician recommends that you continue on your current medications as directed. Please refer to the Current Medication list given to you today. 

## 2015-05-19 ENCOUNTER — Other Ambulatory Visit: Payer: Self-pay | Admitting: *Deleted

## 2015-05-19 MED ORDER — ROSUVASTATIN CALCIUM 10 MG PO TABS: 10.0000 mg | ORAL_TABLET | Freq: Every day | ORAL | Status: DC

## 2015-05-24 ENCOUNTER — Ambulatory Visit (INDEPENDENT_AMBULATORY_CARE_PROVIDER_SITE_OTHER): Payer: Medicare Other | Admitting: Family Medicine

## 2015-05-24 ENCOUNTER — Encounter: Payer: Self-pay | Admitting: Family Medicine

## 2015-05-24 VITALS — BP 134/78 | HR 62 | Temp 97.9°F | Wt 186.0 lb

## 2015-05-24 DIAGNOSIS — R21 Rash and other nonspecific skin eruption: Secondary | ICD-10-CM | POA: Diagnosis not present

## 2015-05-24 DIAGNOSIS — E119 Type 2 diabetes mellitus without complications: Secondary | ICD-10-CM | POA: Diagnosis not present

## 2015-05-24 DIAGNOSIS — I10 Essential (primary) hypertension: Secondary | ICD-10-CM

## 2015-05-24 DIAGNOSIS — E785 Hyperlipidemia, unspecified: Secondary | ICD-10-CM | POA: Diagnosis not present

## 2015-05-24 DIAGNOSIS — Z23 Encounter for immunization: Secondary | ICD-10-CM

## 2015-05-24 LAB — LDL CHOLESTEROL, DIRECT: Direct LDL: 52 mg/dL

## 2015-05-24 LAB — HEMOGLOBIN A1C: Hgb A1c MFr Bld: 5.8 % (ref 4.6–6.5)

## 2015-05-24 NOTE — Assessment & Plan Note (Signed)
S: controlled with LDL <60 today A/P:Continue current meds:  crestor 10mg 

## 2015-05-24 NOTE — Progress Notes (Signed)
[name], MD  Subjective:  Sean Lane is a 81 y.o. year old very pleasant male patient who presents for/with See problem oriented charting ROS- no chest pain or shortness of breath or palpitations. No hypoglycemia or blurry vision.   Past Medical History-  Patient Active Problem List   Diagnosis Date Noted  . Diabetes 12/14/2012    Priority: High  . Atrial flutter 10/03/2011    Priority: High  . Coronary atherosclerosis 12/17/2007    Priority: High  . SINUS BRADYCARDIA 07/20/2010    Priority: Medium  . BPH associated with nocturia 06/08/2009    Priority: Medium  . DIZZINESS 12/15/2008    Priority: Medium  . Hyperlipidemia 04/02/2007    Priority: Medium  . Essential hypertension 04/02/2007    Priority: Medium  . Nephrolithiasis 05/07/2014    Priority: Low  . Gout attack 12/14/2012    Priority: Low  . Hematuria 03/28/2012    Priority: Low  . RBBB 12/28/2008    Priority: Low  . GERD 12/15/2008    Priority: Low  . LOSS, CONDUCTIVE HEARING, COMBINED TYPE 04/16/2007    Priority: Low  . Allergic rhinitis 04/02/2007    Priority: Low  . OSTEOARTHRITIS 04/02/2007    Priority: Low    Medications- reviewed and updated Current Outpatient Prescriptions  Medication Sig Dispense Refill  . aspirin EC 81 MG tablet Take 1 tablet (81 mg total) by mouth daily.    Marland Kitchen dutasteride (AVODART) 0.5 MG capsule Take 1 capsule (0.5 mg total) by mouth daily. 90 capsule 1  . glucose blood (FREESTYLE INSULINX TEST) test strip 1 each by Other route daily. Test blood sugar daily. Dx: E11.9 100 each 2  . Lancets (FREESTYLE) lancets Check blood sugar once daily. 100 each 4  . loratadine (CLARITIN) 10 MG tablet Take 10 mg by mouth daily as needed for allergies.     . multivitamin (THERAGRAN) per tablet Take 1 tablet by mouth daily.      . Omega-3 Fatty Acids (FISH OIL) 1200 MG CAPS Take 2,400 mg by mouth 2 (two) times daily.    Marland Kitchen omeprazole (PRILOSEC) 20 MG capsule Take 1 capsule (20 mg  total) by mouth 2 (two) times daily before a meal. 60 capsule 5  . OVER THE COUNTER MEDICATION Place 1-2 drops into both eyes daily as needed (for red eyes). "[facility] brand for red eyes"    . rosuvastatin (CRESTOR) 10 MG tablet Take 1 tablet (10 mg total) by mouth daily. 90 tablet 1  . Tamsulosin HCl (FLOMAX) 0.4 MG CAPS Take 0.4 mg by mouth daily.    Alveda Reasons 20 MG TABS tablet TAKE 1 TABLET DAILY WITH   SUPPER 90 tablet 2  . nitroGLYCERIN (NITROSTAT) 0.4 MG SL tablet Place 1 tablet (0.4 mg total) under the tongue every 5 (five) minutes as needed for chest pain (for chest pain). (Patient not taking: Reported on 05/24/2015) 25 tablet 3   No current facility-administered medications for this visit.    Objective: BP 134/78 mmHg  Pulse 62  Temp(Src) 97.9 F (36.6 C)  Wt 186 lb (84.369 kg) Gen: NAD, resting comfortably CV: RRR no murmurs rubs or gallops Lungs: CTAB no crackles, wheeze, rhonchi Abdomen: soft/nontender/nondistended/normal bowel sounds. No rebound or guarding.  Ext: no edema Skin: warm, dry Neuro: grossly normal, moves all extremities Diabetic Foot Exam - Simple   Simple Foot Form  Diabetic Foot exam was performed with the following findings:  Yes 05/24/2015  6:16 PM  Visual Inspection  No deformities, no ulcerations, no other skin breakdown bilaterally:  Yes  See comments:  Yes  Sensation Testing  Intact to touch and monofilament testing bilaterally:  Yes  Pulse Check  Posterior Tibialis and Dorsalis pulse intact bilaterally:  Yes  Comments  normal except for several hyperpigmented areas underneath area of diffuse scale.     Assessment/Plan:  Diabetes S: controlled. Without medication Lab Results  Component Value Date   HGBA1C 5.8 05/24/2015  A/P:Continue healthy eating, encouraged patient to be as active as possible   Hyperlipidemia S: controlled with LDL <60 today A/P:Continue current meds:  crestor 10mg    Essential hypertension S: controlled on DASH  diet on repeat check today BP Readings from Last 3 Encounters:  05/24/15 134/78  05/05/15 124/60  01/14/15 140/68  A/P:Continue DASH diet and off medications  Rash on foot S: 20 years ago had very similar rash with multiple blisters that then seemed to dry out. Given an oral medicine that "knocked it out" as opposed to topical that would have taken prolonged time per prior dermatologist reportedly A/P: could be moccasin type tinea pedis, KOH prep (very slight bleed caused by removal of blister), treat with fluconazole 2-6 weeks likely if fungal. Dr. Allyson Sabal if not fungal  6 months   Orders Placed This Encounter  Procedures  . Fungal stain  . Pneumococcal polysaccharide vaccine 23-valent greater than or equal to 2yo subcutaneous/IM  . Flu Vaccine QUAD 36+ mos IM  . LDL cholesterol, direct    [place]  . Hemoglobin A1c    [place]

## 2015-05-24 NOTE — Patient Instructions (Addendum)
Medication Instructions:  No changes  Other Instructions:  We will call you if this is fungal and treat it. Otherwise, get you back in with Dr. Cottie Banda: a1c before you leave along with LDL/bad cholesterol test  Testing/Procedures/Immunizations: Received flu shot today and final pneumonia shot.   Follow-Up (all visit scheduling, rescheduling, cancellations including labs should be scheduled at front desk): 6 months.   Sooner if you need [place] or if you have new or worsening symptoms

## 2015-05-24 NOTE — Assessment & Plan Note (Signed)
S: controlled. Without medication Lab Results  Component Value Date   HGBA1C 5.8 05/24/2015  A/P:Continue healthy eating, encouraged patient to be as active as possible

## 2015-05-24 NOTE — Assessment & Plan Note (Signed)
S: controlled on DASH diet on repeat check today BP Readings from Last 3 Encounters:  05/24/15 134/78  05/05/15 124/60  01/14/15 140/68  A/P:Continue DASH diet and off medications

## 2015-05-26 LAB — KOH PREP: RESULT - KOH: NONE SEEN

## 2015-06-07 DIAGNOSIS — N200 Calculus of kidney: Secondary | ICD-10-CM | POA: Diagnosis not present

## 2015-07-22 ENCOUNTER — Other Ambulatory Visit: Payer: Self-pay | Admitting: Family Medicine

## 2015-07-22 DIAGNOSIS — M25512 Pain in left shoulder: Secondary | ICD-10-CM | POA: Diagnosis not present

## 2015-07-23 ENCOUNTER — Other Ambulatory Visit: Payer: Self-pay | Admitting: *Deleted

## 2015-07-23 MED ORDER — GLUCOSE BLOOD VI STRP: ORAL_STRIP | Status: DC

## 2015-08-09 ENCOUNTER — Other Ambulatory Visit: Payer: Self-pay | Admitting: Family Medicine

## 2015-08-09 NOTE — Telephone Encounter (Signed)
Refill ok? 

## 2015-08-09 NOTE — Telephone Encounter (Signed)
Yes thanks 

## 2015-08-20 DIAGNOSIS — M25512 Pain in left shoulder: Secondary | ICD-10-CM | POA: Diagnosis not present

## 2015-08-20 DIAGNOSIS — M7542 Impingement syndrome of left shoulder: Secondary | ICD-10-CM | POA: Diagnosis not present

## 2015-09-16 ENCOUNTER — Other Ambulatory Visit: Payer: Self-pay | Admitting: Family Medicine

## 2015-09-17 ENCOUNTER — Other Ambulatory Visit: Payer: Self-pay | Admitting: *Deleted

## 2015-09-17 MED ORDER — ROSUVASTATIN CALCIUM 10 MG PO TABS: 10.0000 mg | ORAL_TABLET | Freq: Every day | ORAL | Status: DC

## 2015-09-17 MED ORDER — DUTASTERIDE 0.5 MG PO CAPS: 0.5000 mg | ORAL_CAPSULE | Freq: Every day | ORAL | Status: DC

## 2015-10-06 DIAGNOSIS — Z96652 Presence of left artificial knee joint: Secondary | ICD-10-CM | POA: Diagnosis not present

## 2015-10-06 DIAGNOSIS — Z471 Aftercare following joint replacement surgery: Secondary | ICD-10-CM | POA: Diagnosis not present

## 2015-10-11 ENCOUNTER — Ambulatory Visit (INDEPENDENT_AMBULATORY_CARE_PROVIDER_SITE_OTHER): Payer: Medicare Other | Admitting: Family Medicine

## 2015-10-11 ENCOUNTER — Encounter: Payer: Self-pay | Admitting: Family Medicine

## 2015-10-11 ENCOUNTER — Telehealth: Payer: Self-pay | Admitting: Family Medicine

## 2015-10-11 VITALS — BP 122/60 | HR 61 | Temp 97.8°F | Wt 186.0 lb

## 2015-10-11 DIAGNOSIS — K5732 Diverticulitis of large intestine without perforation or abscess without bleeding: Secondary | ICD-10-CM | POA: Diagnosis not present

## 2015-10-11 DIAGNOSIS — R1032 Left lower quadrant pain: Secondary | ICD-10-CM | POA: Diagnosis not present

## 2015-10-11 MED ORDER — CIPROFLOXACIN HCL 500 MG PO TABS: 500.0000 mg | ORAL_TABLET | Freq: Two times a day (BID) | ORAL | Status: DC

## 2015-10-11 MED ORDER — METRONIDAZOLE 500 MG PO TABS: 500.0000 mg | ORAL_TABLET | Freq: Three times a day (TID) | ORAL | Status: DC

## 2015-10-11 NOTE — Telephone Encounter (Signed)
FYI

## 2015-10-11 NOTE — Progress Notes (Signed)
Sean Reddish, MD  Subjective:  Sean Lane is a 82 y.o. year old very pleasant male patient who presents for/with See problem oriented charting ROS- nausea noted, diarrhea noted, fever noted. LLQ pain noted. No constipation. No polyuria or dysuria. No chest pain or shortness of breath. Does hav efatigue  Past Medical History-  Patient Active Problem List   Diagnosis Date Noted  . Diabetes ([place]) 12/14/2012    Priority: High  . Atrial flutter ([place]) 10/03/2011    Priority: High  . Coronary atherosclerosis 12/17/2007    Priority: High  . SINUS BRADYCARDIA 07/20/2010    Priority: Medium  . BPH associated with nocturia 06/08/2009    Priority: Medium  . DIZZINESS 12/15/2008    Priority: Medium  . Hyperlipidemia 04/02/2007    Priority: Medium  . Essential hypertension 04/02/2007    Priority: Medium  . Nephrolithiasis 05/07/2014    Priority: Low  . Gout attack 12/14/2012    Priority: Low  . Hematuria 03/28/2012    Priority: Low  . RBBB 12/28/2008    Priority: Low  . GERD 12/15/2008    Priority: Low  . LOSS, CONDUCTIVE HEARING, COMBINED TYPE 04/16/2007    Priority: Low  . Allergic rhinitis 04/02/2007    Priority: Low  . OSTEOARTHRITIS 04/02/2007    Priority: Low    Medications- reviewed and updated Current Outpatient Prescriptions  Medication Sig Dispense Refill  . aspirin EC 81 MG tablet Take 1 tablet (81 mg total) by mouth daily.    Marland Kitchen dutasteride (AVODART) 0.5 MG capsule Take 1 capsule (0.5 mg total) by mouth daily. 90 capsule 1  . glucose blood (FREESTYLE INSULINX TEST) test strip USE TO CHECK BLOOD SUGAR DAILY AND PRN 100 each 11  . Lancets (FREESTYLE) lancets Check blood sugar once daily. 100 each 4  . loratadine (CLARITIN) 10 MG tablet Take 10 mg by mouth daily as needed for allergies.     . multivitamin (THERAGRAN) per tablet Take 1 tablet by mouth daily.      . Omega-3 Fatty Acids (FISH OIL) 1200 MG CAPS Take 2,400 mg by mouth 2 (two) times daily.    Marland Kitchen OVER  THE COUNTER MEDICATION Place 1-2 drops into both eyes daily as needed (for red eyes). "[facility] brand for red eyes"    . rosuvastatin (CRESTOR) 10 MG tablet Take 1 tablet (10 mg total) by mouth daily. 90 tablet 1  . Tamsulosin HCl (FLOMAX) 0.4 MG CAPS Take 0.4 mg by mouth daily.    Alveda Reasons 20 MG TABS tablet TAKE 1 TABLET DAILY WITH   SUPPER 90 tablet 2  . amoxicillin (AMOXIL) 500 MG capsule TAKE FOUR CAPSULES BY MOUTH ONE HOUR BEFORE APPOINTMENT (Patient not taking: Reported on 10/11/2015) 8 capsule 3  . ciprofloxacin (CIPRO) 500 MG tablet Take 1 tablet (500 mg total) by mouth 2 (two) times daily. 20 tablet 0  . metroNIDAZOLE (FLAGYL) 500 MG tablet Take 1 tablet (500 mg total) by mouth 3 (three) times daily. Do not use alcohol while taking this medication. 30 tablet 0  . nitroGLYCERIN (NITROSTAT) 0.4 MG SL tablet Place 1 tablet (0.4 mg total) under the tongue every 5 (five) minutes as needed for chest pain (for chest pain). (Patient not taking: Reported on 05/24/2015) 25 tablet 3   No current facility-administered medications for this visit.    Objective: BP 122/60 mmHg  Pulse 61  Temp(Src) 97.8 F (36.6 C)  Wt 186 lb (84.369 kg) Gen: NAD, resting comfortably CV: RRR no murmurs  rubs or gallops Lungs: CTAB no crackles, wheeze, rhonchi Abdomen: soft/moderate tenderness with LLQ palpation, mild tenderness throughout lower abdomen/nondistended/normal bowel sounds. No rebound or guarding.  Ext: no edema Skin: warm, dry, no rash Neuro: grossly normal, moves all extremities  Assessment/Plan:  LLQ pain S: Patient has had some mild intermittent pain in LLQ for about 2 weeks. On Friday evening he noted increasing frequency of these episodes of moderate to severe achy to sharp pain. Also developed fever intermittently up to 101 since Friday. Decreased appetite since that time. Minimal activity level. Sleeping more than normal for him. He does have slight better strength today but episodes of pain  continue. He has been taking tylenol for pain and fever which helps some. Over last 24 hours has had a few episodes of diarrhea as well. Mild nausea. Keeping fluids and soft foods down though. No bright red blood per rectum.  No known history diverticulitis- but has known diverticulosis- reviewed colonoscopy report from 2009 A/P: Diverticulitis (acute new episode). Check CBC and BMET. If severely elevated WBC consider CT- likely over 15-20k WBC. Treat with cipro/flagyl minimum 7 days- reevaluate on Friday to determine 7 vs. 10 days at that time.   Return precautions advised for sooner care.   Orders Placed This Encounter  Procedures  . CBC with Differential  . Basic Metabolic Panel    Meds ordered this encounter  Medications  . ciprofloxacin (CIPRO) 500 MG tablet    Sig: Take 1 tablet (500 mg total) by mouth 2 (two) times daily.    Dispense:  20 tablet    Refill:  0  . metroNIDAZOLE (FLAGYL) 500 MG tablet    Sig: Take 1 tablet (500 mg total) by mouth 3 (three) times daily. Do not use alcohol while taking this medication.    Dispense:  30 tablet    Refill:  0

## 2015-10-11 NOTE — Telephone Encounter (Signed)
PLEASE NOTE: All timestamps contained within this report are represented as [place] Standard Time. CONFIDENTIALTY NOTICE: This fax transmission is intended only for the addressee. It contains information that is legally privileged, confidential or otherwise protected from use or disclosure. If you are not the intended recipient, you are strictly prohibited from reviewing, disclosing, copying using or disseminating any of this information or taking any action in reliance on or regarding this information. If you have received this fax in error, please notify us immediately by telephone so that we can arrange for its return to [place]. Phone: [phone], Toll-Free: [phone], Fax: [phone] Page: 1 of 1 Call Id: [id number] [facility] - Client [place] Patient Name: Sean Lane DOB: Feb 06, 1928 Initial Comment caller states her husband has been having episodes of diverticulitis - has a temp of 101, is c/o pain in his left side, has nausea and diarrhea Nurse Assessment Nurse: Marcelline Deist, RN, Kermit Balo Date/Time (Eastern Time): 10/11/2015 9:02:23 AM Confirm and document reason for call. If symptomatic, describe symptoms. You must click the next button to save text entered. ---Caller states her husband has been having episodes of diverticulitis. Has a temp of 101, is c/o pain in his left side of abdomen, has nausea and diarrhea. Has been sick since Friday. She couldn't get him to go to the ER on Sat. Now, he would be willing to be seen. Has not been eating, has been sleeping day & night. Caller states he has had the abdominal pain on & off for 2 weeks, with it worsening Friday. Has the patient traveled out of the country within the last 30 days? ---Not Applicable Does the patient have any new or worsening symptoms? ---Yes Will a triage be completed? ---Yes Related visit to physician within the last 2 weeks? ---No Does the PT  have any chronic conditions? (i.e. diabetes, asthma, etc.) ---Yes List chronic conditions. ---borderline diabetic, controlled with diet, possible diverticulitis, has heart disease - 2 stents were placed, atrial flutter that went away Is this a behavioral health or substance abuse call? ---No Guidelines Guideline Title Affirmed Question Affirmed Notes Abdominal Pain - Male [1] Fever > 101 F (38.3 C) AND [2] age > 44 Final Disposition User See Physician within 4 Hours (or PCP triage) Marcelline Deist, RN, Lynda Referrals REFERRED TO PCP OFFICE Disagree/Comply: Comply

## 2015-10-11 NOTE — Patient Instructions (Signed)
Take both antibiotics for a minimum of 7 days. We will reevaluate Friday morning at 8 AM (have them create slot- Constance Holster or Estill Bamberg can help).   If you have worsening fever or abdominal pain- see [place] sooner  Symptoms should gradually start to improve within 24-48 hours   Diverticulitis Diverticulitis is inflammation or infection of small pouches in your colon that form when you have a condition called diverticulosis. The pouches in your colon are called diverticula. Your colon, or large intestine, is where water is absorbed and stool is formed. Complications of diverticulitis can include:  Bleeding.  Severe infection.  Severe pain.  Perforation of your colon.  Obstruction of your colon. CAUSES  Diverticulitis is caused by bacteria. Diverticulitis happens when stool becomes trapped in diverticula. This allows bacteria to grow in the diverticula, which can lead to inflammation and infection. RISK FACTORS People with diverticulosis are at risk for diverticulitis. Eating a diet that does not include enough fiber from fruits and vegetables may make diverticulitis more likely to develop. SYMPTOMS  Symptoms of diverticulitis may include:  Abdominal pain and tenderness. The pain is normally located on the left side of the abdomen, but may occur in other areas.  Fever and chills.  Bloating.  Cramping.  Nausea.  Vomiting.  Constipation.  Diarrhea.  Blood in your stool. DIAGNOSIS  Your health care provider will ask you about your medical history and do a physical exam. You may need to have tests done because many medical conditions can cause the same symptoms as diverticulitis. Tests may include:  Blood tests.  Urine tests.  Imaging tests of the abdomen, including X-rays and CT scans. When your condition is under control, your health care provider may recommend that you have a colonoscopy. A colonoscopy can show how severe your diverticula are and whether something else is  causing your symptoms. TREATMENT  Most cases of diverticulitis are mild and can be treated at home. Treatment may include:  Taking over-the-counter pain medicines.  Following a clear liquid diet.  Taking antibiotic medicines by mouth for 7-10 days. More severe cases may be treated at a hospital. Treatment may include:  Drinking clear liquid diet before slowly advancing diet as pain improves  Taking prescription pain medicine.  Receiving antibiotic medicines through an IV tube.  Receiving fluids and nutrition through an IV tube.  Surgery. HOME CARE INSTRUCTIONS   Follow your health care provider's instructions carefully.  Follow a full liquid diet or other diet as directed by your health care provider. After your symptoms improve, your health care provider may tell you to change your diet. He or she may recommend you eat a high-fiber diet. Fruits and vegetables are good sources of fiber. Fiber makes it easier to pass stool.  Take fiber supplements or probiotics as directed by your health care provider.  Only take medicines as directed by your health care provider.  Keep all your follow-up appointments. SEEK MEDICAL CARE IF:   Your pain does not improve.  You have a hard time eating food.  Your bowel movements do not return to normal. SEEK IMMEDIATE MEDICAL CARE IF:   Your pain becomes worse.  Your symptoms do not get better.  Your symptoms suddenly get worse.  You have a fever.  You have repeated vomiting.  You have bloody or black, tarry stools. MAKE SURE YOU:   Understand these instructions.  Will watch your condition.  Will get help right away if you are not doing well or get worse.  This information is not intended to replace advice given to you by your health care provider. Make sure you discuss any questions you have with your health care provider.   Document Released: 06/07/2005 Document Revised: 09/02/2013 Document Reviewed: 07/23/2013 Elsevier  Interactive Patient Education [employer].

## 2015-10-11 NOTE — Telephone Encounter (Signed)
Appointment made today with Dr. Yong Channel - [facility]

## 2015-10-12 LAB — CBC WITH DIFFERENTIAL/PLATELET
BASOS PCT: 0.3 % (ref 0.0–3.0)
Basophils Absolute: 0 10*3/uL (ref 0.0–0.1)
EOS ABS: 0.1 10*3/uL (ref 0.0–0.7)
EOS PCT: 2.5 % (ref 0.0–5.0)
HEMATOCRIT: 44.3 % (ref 39.0–52.0)
HEMOGLOBIN: 15 g/dL (ref 13.0–17.0)
LYMPHS PCT: 32.5 % (ref 12.0–46.0)
Lymphs Abs: 1.4 10*3/uL (ref 0.7–4.0)
MCHC: 33.8 g/dL (ref 30.0–36.0)
MCV: 94.1 fl (ref 78.0–100.0)
Monocytes Absolute: 0.4 10*3/uL (ref 0.1–1.0)
Monocytes Relative: 9.6 % (ref 3.0–12.0)
NEUTROS ABS: 2.3 10*3/uL (ref 1.4–7.7)
Neutrophils Relative %: 55.1 % (ref 43.0–77.0)
PLATELETS: 146 10*3/uL — AB (ref 150.0–400.0)
RBC: 4.71 Mil/uL (ref 4.22–5.81)
RDW: 13.6 % (ref 11.5–15.5)
WBC: 4.2 10*3/uL (ref 4.0–10.5)

## 2015-10-12 LAB — BASIC METABOLIC PANEL
BUN: 14 mg/dL (ref 6–23)
CHLORIDE: 106 meq/L (ref 96–112)
CO2: 27 meq/L (ref 19–32)
Calcium: 9.2 mg/dL (ref 8.4–10.5)
Creatinine, Ser: 0.88 mg/dL (ref 0.40–1.50)
GFR: 87.42 mL/min (ref >60.00)
GLUCOSE: 145 mg/dL — AB (ref 70–99)
POTASSIUM: 4 meq/L (ref 3.5–5.1)
Sodium: 140 mEq/L (ref 135–145)

## 2015-10-15 ENCOUNTER — Ambulatory Visit (INDEPENDENT_AMBULATORY_CARE_PROVIDER_SITE_OTHER): Payer: Medicare Other | Admitting: Family Medicine

## 2015-10-15 ENCOUNTER — Encounter: Payer: Self-pay | Admitting: Family Medicine

## 2015-10-15 VITALS — BP 104/54 | HR 59 | Temp 97.7°F | Wt 185.0 lb

## 2015-10-15 DIAGNOSIS — K5732 Diverticulitis of large intestine without perforation or abscess without bleeding: Secondary | ICD-10-CM | POA: Diagnosis not present

## 2015-10-15 NOTE — Progress Notes (Signed)
[name], MD  Subjective:  Sean Lane is a 82 y.o. year old very pleasant male patient who presents for/with See problem oriented charting ROS- nausea now resolved, 1 loose stool per day, fever resolved within a day of antibiotics. LLQ pain noted but rated as minimal and much more intermittent. No constipation. No polyuria or dysuria. No chest pain or shortness of breath. Fatigue has resolved  Past Medical History-  Patient Active Problem List   Diagnosis Date Noted  . Diabetes ([place]) 12/14/2012    Priority: High  . Atrial flutter ([place]) 10/03/2011    Priority: High  . Coronary atherosclerosis 12/17/2007    Priority: High  . SINUS BRADYCARDIA 07/20/2010    Priority: Medium  . BPH associated with nocturia 06/08/2009    Priority: Medium  . DIZZINESS 12/15/2008    Priority: Medium  . Hyperlipidemia 04/02/2007    Priority: Medium  . Essential hypertension 04/02/2007    Priority: Medium  . Nephrolithiasis 05/07/2014    Priority: Low  . Gout attack 12/14/2012    Priority: Low  . Hematuria 03/28/2012    Priority: Low  . RBBB 12/28/2008    Priority: Low  . GERD 12/15/2008    Priority: Low  . LOSS, CONDUCTIVE HEARING, COMBINED TYPE 04/16/2007    Priority: Low  . Allergic rhinitis 04/02/2007    Priority: Low  . OSTEOARTHRITIS 04/02/2007    Priority: Low    Medications- reviewed and updated Current Outpatient Prescriptions  Medication Sig Dispense Refill  . aspirin EC 81 MG tablet Take 1 tablet (81 mg total) by mouth daily.    . ciprofloxacin (CIPRO) 500 MG tablet Take 1 tablet (500 mg total) by mouth 2 (two) times daily. 20 tablet 0  . dutasteride (AVODART) 0.5 MG capsule Take 1 capsule (0.5 mg total) by mouth daily. 90 capsule 1  . glucose blood (FREESTYLE INSULINX TEST) test strip USE TO CHECK BLOOD SUGAR DAILY AND PRN 100 each 11  . Lancets (FREESTYLE) lancets Check blood sugar once daily. 100 each 4  . loratadine (CLARITIN) 10 MG tablet Take 10 mg by mouth  daily as needed for allergies.     . metroNIDAZOLE (FLAGYL) 500 MG tablet Take 1 tablet (500 mg total) by mouth 3 (three) times daily. Do not use alcohol while taking this medication. 30 tablet 0  . multivitamin (THERAGRAN) per tablet Take 1 tablet by mouth daily.      . Omega-3 Fatty Acids (FISH OIL) 1200 MG CAPS Take 2,400 mg by mouth 2 (two) times daily.    Marland Kitchen OVER THE COUNTER MEDICATION Place 1-2 drops into both eyes daily as needed (for red eyes). "[facility] brand for red eyes"    . rosuvastatin (CRESTOR) 10 MG tablet Take 1 tablet (10 mg total) by mouth daily. 90 tablet 1  . Tamsulosin HCl (FLOMAX) 0.4 MG CAPS Take 0.4 mg by mouth daily.    Alveda Reasons 20 MG TABS tablet TAKE 1 TABLET DAILY WITH   SUPPER 90 tablet 2  . amoxicillin (AMOXIL) 500 MG capsule TAKE FOUR CAPSULES BY MOUTH ONE HOUR BEFORE APPOINTMENT (Patient not taking: Reported on 10/11/2015) 8 capsule 3  . nitroGLYCERIN (NITROSTAT) 0.4 MG SL tablet Place 1 tablet (0.4 mg total) under the tongue every 5 (five) minutes as needed for chest pain (for chest pain). (Patient not taking: Reported on 05/24/2015) 25 tablet 3   Objective: BP 104/54 mmHg  Pulse 59  Temp(Src) 97.7 F (36.5 C)  Wt 185 lb (83.915 kg) Gen:  NAD, resting comfortably CV: RRR no murmurs rubs or gallops Lungs: CTAB no crackles, wheeze, rhonchi Abdomen: soft/minimal tenderness with deep LLQ palpation, no longer with tenderness throughout rest of abdomen/nondistended/normal bowel sounds. No rebound or guarding.  Ext: no edema Skin: warm, dry, no rash Neuro: grossly normal, moves all extremities  Assessment/Plan:  Diverticulitis S: Saw patient on Monday for diverticulitis. CBC and CMP were unremarkable. Treated with cipro/flagyl starting Monday evening. Patient has been compliant and within 24 hours was feeling some improvement. Today, he states he has very light/minimal intermittent pain in LLQ with much less frequency than anything he experienced over last 3 weeks  (had some mild pains before diverticulitis became clear with fever and worsening pain). Pain is slight ache and short lived when occurs. 1 loose bowel movement a day but no regular diarrhea. Appetite has increased again. Strength has improved.  A/P:Diverticulitis. Improving on cipro/flagyl. Patient had several questions about dietary intake and counseled him that current evidence is not really clear what best approach is here. Specifically though nuts/seeds are not contraindicated per uptodate. We discussed since he is still having intermittent pain to likely take full 10 days of antibiotic unless he has 100% resolution for over 24 hours.    Return precautions advised   >50% of 15 minute office visit was spent on counseling (about dietary intake, follow up care) and coordination of care

## 2015-10-15 NOTE — Patient Instructions (Signed)
Glad you are doing so much better! Let's finish the full course of 10 days unless you feel 100% better by Tuesday morning. Follow up if new fever, worsening abdominal pain, any new symptoms

## 2015-10-18 ENCOUNTER — Other Ambulatory Visit: Payer: Self-pay | Admitting: Family Medicine

## 2015-10-29 ENCOUNTER — Other Ambulatory Visit: Payer: Self-pay

## 2015-10-29 ENCOUNTER — Encounter: Payer: Self-pay | Admitting: Family Medicine

## 2015-10-29 MED ORDER — ROSUVASTATIN CALCIUM 10 MG PO TABS: 10.0000 mg | ORAL_TABLET | Freq: Every day | ORAL | Status: DC

## 2015-10-29 NOTE — Progress Notes (Signed)
Patient ID: Sean Lane, male   DOB: 1927-11-10, 82 y.o.   MRN: [id number]   Sean Lane returns today for followup. He is a very pleasant 82 y.o.  man with a history of atrial flutter, hypertension, and sinus bradycardia and CAD . The patient has maintaining sinus rhythm since last February. I had initially seen him in January 80  and he was in atrial flutter with a controlled ventricular response. He spontaneously reverted to sinus rhythm. He was placed on anticoagulation with Coumadin. He denies chest pain or shortness of breath  Changed to  Xarelto end of June having microhematuria Sees Sean Lane for prostate   He has a history of CAD, s/p PCI to CFX in 2009 with Cypher DES x2, hypertension, hyperlipidemia, right bundle branch block, GERD, BPH. LHC 3/09: Mid RCA 50%, left main 20%, proximal LAD 40-50%, mid LAD 40%, proximal circumflex 80% and an 80% after OM2, left PDA 40%, EF 60%. PCI: Cypher DES x2 to the proximal and mid circumflex   Last exercise treadmill test 1/12: Heart rate increased appropriately, no ischemic EKG changes.   Discussed future need of pacer. Avoid all AV nodal blocking drugs. He exercises regularly and has good chronotropic response  7/15  LDL 57    Recent bout of diverticulitis  Rx as outpatient by Sean Lane Off antibiotics now   Last echo 2015 normal EF  Study Conclusions  - Left ventricle: The cavity size was normal. Systolic function was normal. The estimated ejection fraction was in the range of 55% to 60%. Wall motion was normal; there were no regional wall motion abnormalities. - Mitral valve: Mild regurgitation. - Left atrium: The atrium was mildly dilated. - Atrial septum: No defect or patent foramen ovale was identified.   ROS: Denies fever, malais, weight loss, blurry vision, decreased visual acuity, cough, sputum, SOB, hemoptysis, pleuritic pain, palpitaitons, heartburn, abdominal pain, melena, lower extremity edema, claudication, or rash.   All other systems reviewed and negative  General: Affect appropriate not postural systoolic BP [id number] sitting 0000000 standing  Healthy:  appears stated age HEENT: normal Neck supple with no adenopathy JVP normal no bruits no thyromegaly Lungs clear with no wheezing and good diaphragmatic motion Heart:  S1/S2 no murmur, no rub, gallop or click PMI normal Abdomen: benighn, BS positve, no tenderness, no AAA no bruit.  No HSM or HJR Distal pulses intact with no bruits No edema Neuro non-focal Skin warm and dry No muscular weakness   Current Outpatient Prescriptions  Medication Sig Dispense Refill  . amoxicillin (AMOXIL) 500 MG capsule TAKE FOUR CAPSULES BY MOUTH ONE HOUR BEFORE APPOINTMENT 8 capsule 3  . aspirin EC 81 MG tablet Take 1 tablet (81 mg total) by mouth daily.    . ciprofloxacin (CIPRO) 500 MG tablet Take 1 tablet (500 mg total) by mouth 2 (two) times daily. 20 tablet 0  . dutasteride (AVODART) 0.5 MG capsule Take 1 capsule (0.5 mg total) by mouth daily. 90 capsule 1  . loratadine (CLARITIN) 10 MG tablet Take 10 mg by mouth daily as needed for allergies.     . metroNIDAZOLE (FLAGYL) 500 MG tablet Take 1 tablet (500 mg total) by mouth 3 (three) times daily. Do not use alcohol while taking this medication. 30 tablet 0  . multivitamin (THERAGRAN) per tablet Take 1 tablet by mouth daily.      . nitroGLYCERIN (NITROSTAT) 0.4 MG SL tablet Place 1 tablet (0.4 mg total) under the tongue every 5 (five) minutes as needed  for chest pain (for chest pain). 25 tablet 3  . Omega-3 Fatty Acids (FISH OIL) 1200 MG CAPS Take 2,400 mg by mouth 2 (two) times daily.    Marland Kitchen OVER THE COUNTER MEDICATION Place 1-2 drops into both eyes daily as needed (for red eyes). "[facility] brand for red eyes"    . rivaroxaban (XARELTO) 20 MG TABS tablet Take 20 mg by mouth daily with supper.    . rosuvastatin (CRESTOR) 10 MG tablet Take 1 tablet (10 mg total) by mouth daily. 90 tablet 3  . Tamsulosin HCl (FLOMAX) 0.4 MG  CAPS Take 0.4 mg by mouth daily.     No current facility-administered medications for this visit.    Allergies  Hydromorphone hcl and Sulfonamide derivatives  Electrocardiogram:  Sinus arrhythmia RBBB no high grade AV block 04/30/14   Assessment and Plan CAD: Stable with no angina and good activity level.  Continue medical Rx  PAF: maint NSR continue xarelto   Anticoagulation: renal function good no bleeding issues continue xarelto  SSS: monitor with no pauses.  Baseline RBBB  ETT with adequate HR response  Observe  HTN Well controlled.  Continue current medications and low sodium Dash type diet.    Chol:  Lab Results  Component Value Date   LDLCALC 54 04/09/2014   Diverticulitis:  Resolved documented diverticulosis on previous colonoscopy  Discussed low fiber diet  Sean Lane

## 2015-11-05 ENCOUNTER — Ambulatory Visit (INDEPENDENT_AMBULATORY_CARE_PROVIDER_SITE_OTHER): Payer: Medicare Other | Admitting: Cardiovascular Disease

## 2015-11-05 ENCOUNTER — Encounter: Payer: Self-pay | Admitting: Cardiovascular Disease

## 2015-11-05 VITALS — BP 104/56 | HR 58 | Ht 73.0 in | Wt 187.0 lb

## 2015-11-05 DIAGNOSIS — I10 Essential (primary) hypertension: Secondary | ICD-10-CM | POA: Diagnosis not present

## 2015-11-05 NOTE — Patient Instructions (Signed)

## 2015-11-22 ENCOUNTER — Encounter: Payer: Self-pay | Admitting: Family Medicine

## 2015-11-22 ENCOUNTER — Ambulatory Visit (INDEPENDENT_AMBULATORY_CARE_PROVIDER_SITE_OTHER): Payer: Medicare Other | Admitting: Family Medicine

## 2015-11-22 VITALS — BP 132/62 | HR 70 | Temp 97.6°F | Wt 188.0 lb

## 2015-11-22 DIAGNOSIS — E119 Type 2 diabetes mellitus without complications: Secondary | ICD-10-CM | POA: Diagnosis not present

## 2015-11-22 DIAGNOSIS — I4892 Unspecified atrial flutter: Secondary | ICD-10-CM | POA: Diagnosis not present

## 2015-11-22 DIAGNOSIS — I10 Essential (primary) hypertension: Secondary | ICD-10-CM | POA: Diagnosis not present

## 2015-11-22 DIAGNOSIS — K5732 Diverticulitis of large intestine without perforation or abscess without bleeding: Secondary | ICD-10-CM | POA: Insufficient documentation

## 2015-11-22 LAB — MICROALBUMIN / CREATININE URINE RATIO
Creatinine,U: 141.3 mg/dL
MICROALB UR: 3 mg/dL — AB (ref 0.0–1.9)
Microalb Creat Ratio: 2.1 mg/g (ref 0.0–30.0)

## 2015-11-22 LAB — POCT GLYCOSYLATED HEMOGLOBIN (HGB A1C): HEMOGLOBIN A1C: 5.3

## 2015-11-22 NOTE — Progress Notes (Signed)
[name], MD  Subjective:  Sean Lane is a 82 y.o. year old very pleasant male patient who presents for/with See problem oriented charting ROS- no fever chills nausea or vomiting. No chest pain shortness of breath and no hypoglycemia. no blurry vision or headache  Past Medical History-  Patient Active Problem List   Diagnosis Date Noted  . Diabetes ([place]) 12/14/2012    Priority: High  . Atrial flutter ([place]) 10/03/2011    Priority: High  . Coronary atherosclerosis 12/17/2007    Priority: High  . SINUS BRADYCARDIA 07/20/2010    Priority: Medium  . BPH associated with nocturia 06/08/2009    Priority: Medium  . DIZZINESS 12/15/2008    Priority: Medium  . Hyperlipidemia 04/02/2007    Priority: Medium  . Essential hypertension 04/02/2007    Priority: Medium  . Nephrolithiasis 05/07/2014    Priority: Low  . Gout attack 12/14/2012    Priority: Low  . Hematuria 03/28/2012    Priority: Low  . RBBB 12/28/2008    Priority: Low  . GERD 12/15/2008    Priority: Low  . LOSS, CONDUCTIVE HEARING, COMBINED TYPE 04/16/2007    Priority: Low  . Allergic rhinitis 04/02/2007    Priority: Low  . OSTEOARTHRITIS 04/02/2007    Priority: Low  . Diverticulitis of colon 11/22/2015    Medications- reviewed and updated Current Outpatient Prescriptions  Medication Sig Dispense Refill  . aspirin EC 81 MG tablet Take 1 tablet (81 mg total) by mouth daily.    Marland Kitchen dutasteride (AVODART) 0.5 MG capsule Take 1 capsule (0.5 mg total) by mouth daily. 90 capsule 1  . loratadine (CLARITIN) 10 MG tablet Take 10 mg by mouth daily as needed for allergies.     . multivitamin (THERAGRAN) per tablet Take 1 tablet by mouth daily.      . Omega-3 Fatty Acids (FISH OIL) 1200 MG CAPS Take 2,400 mg by mouth 2 (two) times daily.    Marland Kitchen OVER THE COUNTER MEDICATION Place 1-2 drops into both eyes daily as needed (for red eyes). "[facility] brand for red eyes"    . rivaroxaban (XARELTO) 20 MG TABS tablet Take 20 mg by  mouth daily with supper.    . rosuvastatin (CRESTOR) 10 MG tablet Take 1 tablet (10 mg total) by mouth daily. 90 tablet 3  . Tamsulosin HCl (FLOMAX) 0.4 MG CAPS Take 0.4 mg by mouth daily.    Marland Kitchen amoxicillin (AMOXIL) 500 MG capsule TAKE FOUR CAPSULES BY MOUTH ONE HOUR BEFORE APPOINTMENT (Patient not taking: Reported on 11/22/2015) 8 capsule 3  . nitroGLYCERIN (NITROSTAT) 0.4 MG SL tablet Place 1 tablet (0.4 mg total) under the tongue every 5 (five) minutes as needed for chest pain (for chest pain). (Patient not taking: Reported on 11/22/2015) 25 tablet 3   No current facility-administered medications for this visit.    Objective: BP 132/62 mmHg  Pulse 70  Temp(Src) 97.6 F (36.4 C)  Wt 188 lb (85.276 kg) Gen: NAD, resting comfortably-Appears more like himself than previous CV: RRR no murmurs rubs or gallops Lungs: CTAB no crackles, wheeze, rhonchi Abdomen: soft/Patient tender to deep palpation in left lower quadrant only/nondistended/normal bowel sounds. No rebound or guarding.  Ext: no edema Skin: warm, dry Neuro: grossly normal, moves all extremities  Assessment/Plan:  Diabetes ([place]) S: Diet controlled without medication. No hypoglycemia Lab Results  Component Value Date   HGBA1C 5.3 11/22/2015  A/P: Check urine microalbumin today. A1c looks great. Continue to monitor   Essential hypertension S:  controlled off of medication on DASH diet BP Readings from Last 3 Encounters:  11/22/15 132/62  11/05/15 104/56  10/15/15 104/54  A/P:Continue Without medication but continue to monitor-trending up   Atrial flutter S: Remains in sinus rhythm at this time. Compliant with Xarelto for anticoagulation A/P: Continue current medication as well as cardiology follow-up   Diverticulitis of colon S:2 days LLQ pain. Also with mild worsening fatigue. This is how he felt before other episode flared up. No fever or chills yet. Pain on exam is in very similar location to previous A/P: We are  going to be Aggressive and go ahead and treat him with ciprofloxacin and Flagyl. He actually has 10 days remaining at home due to prior prescription being given in excess. He will take for 10 days. We discussed if he has recurrence after this point or if he fails to improve then we would get a CT of abdomen and pelvis. It appears he had complete resolution of prior episode and this is a new acute case of diverticulitis   Return in about 6 months (around 05/24/2016) for follow up- or sooner if needed with diverticulitis return precautions advised.   Orders Placed This Encounter  Procedures  . Microalbumin / creatinine urine ratio    [place]  . POC HgB A1c

## 2015-11-22 NOTE — Patient Instructions (Addendum)
Drop urine off in lab  [name] will do a finger prick for a1c- as long as it is below 6.5 you may leave  Go ahead and take 10 days of the cipro and flagyl. If you do not have complete resolution or if you have worsening- return to see me- would like scan your abdomen at that point. Let's be aggressive to keep you from getting you as sick as you were last time

## 2015-11-22 NOTE — Assessment & Plan Note (Signed)
S: Diet controlled without medication. No hypoglycemia Lab Results  Component Value Date   HGBA1C 5.3 11/22/2015  A/P: Check urine microalbumin today. A1c looks great. Continue to monitor

## 2015-11-22 NOTE — Assessment & Plan Note (Signed)
S: controlled off of medication on DASH diet BP Readings from Last 3 Encounters:  11/22/15 132/62  11/05/15 104/56  10/15/15 104/54  A/P:Continue Without medication but continue to monitor-trending up

## 2015-11-22 NOTE — Assessment & Plan Note (Signed)
S:2 days LLQ pain. Also with mild worsening fatigue. This is how he felt before other episode flared up. No fever or chills yet. Pain on exam is in very similar location to previous A/P: We are going to be Aggressive and go ahead and treat him with ciprofloxacin and Flagyl. He actually has 10 days remaining at home due to prior prescription being given in excess. He will take for 10 days. We discussed if he has recurrence after this point or if he fails to improve then we would get a CT of abdomen and pelvis. It appears he had complete resolution of prior episode and this is a new acute case of diverticulitis

## 2015-11-22 NOTE — Assessment & Plan Note (Signed)
S: Remains in sinus rhythm at this time. Compliant with Xarelto for anticoagulation A/P: Continue current medication as well as cardiology follow-up

## 2015-11-30 DIAGNOSIS — E119 Type 2 diabetes mellitus without complications: Secondary | ICD-10-CM | POA: Diagnosis not present

## 2015-11-30 LAB — HM DIABETES EYE EXAM

## 2015-12-02 ENCOUNTER — Telehealth: Payer: Self-pay | Admitting: Family Medicine

## 2015-12-02 NOTE — Telephone Encounter (Signed)
noted 

## 2015-12-02 NOTE — Telephone Encounter (Signed)
Patient Name: Sean Lane  DOB: 09-22-1927    Initial Comment Caller states husband was on ABX for diverticulitis- since Tuesday running a temp 99,9 when he woke up this morning - head congestion- cough- fatigue- diabetic.    Nurse Assessment  Nurse: Raphael Gibney, RN, Vanita Ingles Date/Time (Eastern Time): 12/02/2015 10:04:32 AM  Confirm and document reason for call. If symptomatic, describe symptoms. You must click the next button to save text entered. ---Caller states spouse has been taking antibiotics for diverticulitis. He takes his last dose. Has had coughing, chills, sinus congestion since Tuesday. Has aches and pains. No energy. Temp 99.9 this am and went to 101 yesterday.  Has the patient traveled out of the country within the last 30 days? ---No  Does the patient have any new or worsening symptoms? ---Yes  Will a triage be completed? ---Yes  Related visit to physician within the last 2 weeks? ---No  Does the PT have any chronic conditions? (i.e. diabetes, asthma, etc.) ---Yes  List chronic conditions. ---diabetes, atrial flutter  Is this a behavioral health or substance abuse call? ---No     Guidelines    Guideline Title Affirmed Question Affirmed Notes  Sinus Pain or Congestion Lots of coughing    Final Disposition User   See PCP When Office is Open (within 3 days) Raphael Gibney, RN, Vanita Ingles    Comments  Appt scheduled for 12/03/15 at 11:15 am with Dr. [name]   Referrals  REFERRED TO PCP OFFICE   Disagree/Comply: Comply

## 2015-12-03 ENCOUNTER — Encounter: Payer: Self-pay | Admitting: Family Medicine

## 2015-12-03 ENCOUNTER — Ambulatory Visit (INDEPENDENT_AMBULATORY_CARE_PROVIDER_SITE_OTHER): Payer: Medicare Other | Admitting: Family Medicine

## 2015-12-03 VITALS — BP 120/70 | HR 64 | Temp 100.3°F | Wt 190.0 lb

## 2015-12-03 DIAGNOSIS — R509 Fever, unspecified: Secondary | ICD-10-CM | POA: Diagnosis not present

## 2015-12-03 DIAGNOSIS — J111 Influenza due to unidentified influenza virus with other respiratory manifestations: Secondary | ICD-10-CM | POA: Diagnosis not present

## 2015-12-03 DIAGNOSIS — R059 Cough, unspecified: Secondary | ICD-10-CM

## 2015-12-03 DIAGNOSIS — R05 Cough: Secondary | ICD-10-CM

## 2015-12-03 LAB — POCT INFLUENZA A: RAPID INFLUENZA A AGN: POSITIVE

## 2015-12-03 MED ORDER — OSELTAMIVIR PHOSPHATE 75 MG PO CAPS: 75.0000 mg | ORAL_CAPSULE | Freq: Two times a day (BID) | ORAL | Status: DC

## 2015-12-03 NOTE — Progress Notes (Signed)
Pre visit review using our clinic review tool, if applicable. No additional management support is needed unless otherwise documented below in the visit note. 

## 2015-12-03 NOTE — Patient Instructions (Signed)
Flu-like illness: Influenza specifically (the flu)  Patient will be treated with Tamiflu. Symptomatic treatment with: rest, hydration, tylenol, ibuprofen  Finally, we reviewed reasons to return to care including if symptoms worsen or persist (past 7-10 days) or new concerns arise (especially shortness of breath).   Meds ordered this encounter  Medications  . oseltamivir (TAMIFLU) 75 MG capsule    Sig: Take 1 capsule (75 mg total) by mouth 2 (two) times daily.    Dispense:  10 capsule    Refill:  0   Influenza Tests WHY AM I HAVING THIS TEST? You may have an influenza test to help your health care provider determine what type of respiratory infection you have. The test may also be used to help determine a treatment plan and to monitor influenza activity within a community. There are two types of influenza virus: types A and B. Often, one strain of type A influenza will be the most common type of influenza in a community during flu season. This is typically between the months of October and May. Influenza tests can help determine which strain of influenza type A is occurring most often in the community. WHAT KIND OF SAMPLE IS TAKEN? Influenza tests are performed by collecting a small sample of fluids (secretions) from your nose or throat using a cotton swab. Tests performed on nasal secretions are more accurate than tests performed on a sample taken from your throat.  Rapid influenza tests are available and have become the most frequently used tests for influenza. They are most accurate when completed within the first 48 hours after your symptoms begin.  Depending on the method, a rapid influenza test may be completed in your health care provider's office in less than 30 minutes. It can also be sent to a lab with the results available the same day.  Depending on the particular type of test used, it can identify influenza type A, a mixture of types A and B, or differentiate between type A and  B.  Another test that your health care provider may order is a viral culture. This also requires the collection of secretions from your nose or throat. The sample is then sent to a lab for processing. This may take several days to complete. HOW ARE YOUR TEST RESULTS REPORTED? Your test results will be reported as either positive or negative. A false-negative result can occur. A false-negative result is incorrect because it indicates a condition or finding is not present when it is. It is your responsibility to obtain your test results. Ask the lab or department performing the test when and how you will get your results. WHAT DO THE RESULTS MEAN?  A positive test means you have influenza. Tests may further determine the type of influenza you have.  A negative influenza test result means it is not likely that you have influenza.  A false-negative result can occur. False-negative results are more likely to happen at the height of the influenza season. Talk with your health care provider to discuss your results, treatment options, and if necessary, the need for more tests. Talk with your health care provider if you have any questions about your results.   This information is not intended to replace advice given to you by your health care provider. Make sure you discuss any questions you have with your health care provider.   Document Released: 06/07/2005 Document Revised: 09/18/2014 Document Reviewed: 01/14/2014 Elsevier Interactive Patient Education [employer].

## 2015-12-03 NOTE — Progress Notes (Signed)
PCP: [name], MD  Subjective:  Sean Lane is a 82 y.o. year old very pleasant male patient who presents with flu like symptoms including fever,body aches, cough, sinus congestion -other symptoms: mild sore throat, no sinus pressure -started: Monday, worsened Tuesday but has not started improving yet -inside 48 hour treatment window if needed for tamiflu: no -high risk condition (children <5, adults >65, chronic pulmonary or cardiac condition, immunosuppression, pregnancy, nursing home resident, morbid obesity) : yes- cardiac disease and age -symptoms show no change to slightly worsening -previous treatments: acetaminophen, ibuprofen, rest  ROS-denies SOB, NVD, sinus or dental pain  Pertinent Past Medical History-  Patient Active Problem List   Diagnosis Date Noted  . Diabetes ([place]) 12/14/2012    Priority: High  . Atrial flutter ([place]) 10/03/2011    Priority: High  . Coronary atherosclerosis 12/17/2007    Priority: High  . SINUS BRADYCARDIA 07/20/2010    Priority: Medium  . BPH associated with nocturia 06/08/2009    Priority: Medium  . DIZZINESS 12/15/2008    Priority: Medium  . Hyperlipidemia 04/02/2007    Priority: Medium  . Essential hypertension 04/02/2007    Priority: Medium  . Nephrolithiasis 05/07/2014    Priority: Low  . Gout attack 12/14/2012    Priority: Low  . Hematuria 03/28/2012    Priority: Low  . RBBB 12/28/2008    Priority: Low  . GERD 12/15/2008    Priority: Low  . LOSS, CONDUCTIVE HEARING, COMBINED TYPE 04/16/2007    Priority: Low  . Allergic rhinitis 04/02/2007    Priority: Low  . OSTEOARTHRITIS 04/02/2007    Priority: Low  . Diverticulitis of colon 11/22/2015    Medications- reviewed  Current Outpatient Prescriptions  Medication Sig Dispense Refill  . amoxicillin (AMOXIL) 500 MG capsule TAKE FOUR CAPSULES BY MOUTH ONE HOUR BEFORE APPOINTMENT 8 capsule 3  . aspirin EC 81 MG tablet Take 1 tablet (81 mg total) by mouth daily.    Marland Kitchen  dutasteride (AVODART) 0.5 MG capsule Take 1 capsule (0.5 mg total) by mouth daily. 90 capsule 1  . loratadine (CLARITIN) 10 MG tablet Take 10 mg by mouth daily as needed for allergies.     . multivitamin (THERAGRAN) per tablet Take 1 tablet by mouth daily.      . nitroGLYCERIN (NITROSTAT) 0.4 MG SL tablet Place 1 tablet (0.4 mg total) under the tongue every 5 (five) minutes as needed for chest pain (for chest pain). 25 tablet 3  . Omega-3 Fatty Acids (FISH OIL) 1200 MG CAPS Take 2,400 mg by mouth 2 (two) times daily.    Marland Kitchen OVER THE COUNTER MEDICATION Place 1-2 drops into both eyes daily as needed (for red eyes). "[facility] brand for red eyes"    . rivaroxaban (XARELTO) 20 MG TABS tablet Take 20 mg by mouth daily with supper.    . rosuvastatin (CRESTOR) 10 MG tablet Take 1 tablet (10 mg total) by mouth daily. 90 tablet 3  . Tamsulosin HCl (FLOMAX) 0.4 MG CAPS Take 0.4 mg by mouth daily.     No current facility-administered medications for this visit.    Objective: BP 120/70 mmHg  Pulse 64  Temp(Src) 100.3 F (37.9 C) (Oral)  Wt 190 lb (86.183 kg)  SpO2 91% Gen: NAD, appears fatigued HEENT: Turbinates erythematous, TM normal, pharynx mildly erythematous with no tonsilar exudate or edema, no sinus tenderness CV: RRR no murmurs rubs or gallops Lungs: CTAB no crackles, wheeze, rhonchi Abdomen: soft/nontender/nondistended/normal bowel sounds. Ext: no edema Skin:  warm, dry, no rash  Results for orders placed or performed in visit on 12/03/15 (from the past 24 hour(s))  POCT Influenza A     Status: None   Collection Time: 12/03/15 11:38 AM  Result Value Ref Range   Rapid Influenza A Ag Positive     Assessment/Plan:  Flu-like illness: Influenza specifically History and exam today are suggestive of viral process. Patients influenza test was positive.  Pretest probability of influenza was high.   Patient will be treated with Tamiflu. Prophylaxis for other [place] patients: yes, wife [name]  Novacek Symptomatic treatment with: rest, hydration, tylenol, ibuprofen  Finally, we reviewed reasons to return to care including if symptoms worsen or persist (past 7-10 days) or new concerns arise (especially shortness of breath).   Meds ordered this encounter  Medications  . oseltamivir (TAMIFLU) 75 MG capsule    Sig: Take 1 capsule (75 mg total) by mouth 2 (two) times daily.    Dispense:  10 capsule    Refill:  0

## 2016-02-16 DIAGNOSIS — M546 Pain in thoracic spine: Secondary | ICD-10-CM | POA: Diagnosis not present

## 2016-02-16 DIAGNOSIS — M9901 Segmental and somatic dysfunction of cervical region: Secondary | ICD-10-CM | POA: Diagnosis not present

## 2016-02-16 DIAGNOSIS — M542 Cervicalgia: Secondary | ICD-10-CM | POA: Diagnosis not present

## 2016-02-16 DIAGNOSIS — M62830 Muscle spasm of back: Secondary | ICD-10-CM | POA: Diagnosis not present

## 2016-02-16 DIAGNOSIS — M9902 Segmental and somatic dysfunction of thoracic region: Secondary | ICD-10-CM | POA: Diagnosis not present

## 2016-02-16 DIAGNOSIS — M9900 Segmental and somatic dysfunction of head region: Secondary | ICD-10-CM | POA: Diagnosis not present

## 2016-02-17 DIAGNOSIS — M62830 Muscle spasm of back: Secondary | ICD-10-CM | POA: Diagnosis not present

## 2016-02-17 DIAGNOSIS — M9900 Segmental and somatic dysfunction of head region: Secondary | ICD-10-CM | POA: Diagnosis not present

## 2016-02-17 DIAGNOSIS — M9902 Segmental and somatic dysfunction of thoracic region: Secondary | ICD-10-CM | POA: Diagnosis not present

## 2016-02-17 DIAGNOSIS — M546 Pain in thoracic spine: Secondary | ICD-10-CM | POA: Diagnosis not present

## 2016-02-17 DIAGNOSIS — M9901 Segmental and somatic dysfunction of cervical region: Secondary | ICD-10-CM | POA: Diagnosis not present

## 2016-02-17 DIAGNOSIS — M542 Cervicalgia: Secondary | ICD-10-CM | POA: Diagnosis not present

## 2016-02-21 DIAGNOSIS — M9900 Segmental and somatic dysfunction of head region: Secondary | ICD-10-CM | POA: Diagnosis not present

## 2016-02-21 DIAGNOSIS — M542 Cervicalgia: Secondary | ICD-10-CM | POA: Diagnosis not present

## 2016-02-21 DIAGNOSIS — M62830 Muscle spasm of back: Secondary | ICD-10-CM | POA: Diagnosis not present

## 2016-02-21 DIAGNOSIS — M9901 Segmental and somatic dysfunction of cervical region: Secondary | ICD-10-CM | POA: Diagnosis not present

## 2016-02-21 DIAGNOSIS — M546 Pain in thoracic spine: Secondary | ICD-10-CM | POA: Diagnosis not present

## 2016-02-21 DIAGNOSIS — M9902 Segmental and somatic dysfunction of thoracic region: Secondary | ICD-10-CM | POA: Diagnosis not present

## 2016-02-23 DIAGNOSIS — M9901 Segmental and somatic dysfunction of cervical region: Secondary | ICD-10-CM | POA: Diagnosis not present

## 2016-02-23 DIAGNOSIS — M62830 Muscle spasm of back: Secondary | ICD-10-CM | POA: Diagnosis not present

## 2016-02-23 DIAGNOSIS — M9900 Segmental and somatic dysfunction of head region: Secondary | ICD-10-CM | POA: Diagnosis not present

## 2016-02-23 DIAGNOSIS — M9902 Segmental and somatic dysfunction of thoracic region: Secondary | ICD-10-CM | POA: Diagnosis not present

## 2016-02-23 DIAGNOSIS — M542 Cervicalgia: Secondary | ICD-10-CM | POA: Diagnosis not present

## 2016-02-23 DIAGNOSIS — M546 Pain in thoracic spine: Secondary | ICD-10-CM | POA: Diagnosis not present

## 2016-02-25 DIAGNOSIS — Z4789 Encounter for other orthopedic aftercare: Secondary | ICD-10-CM | POA: Diagnosis not present

## 2016-03-01 ENCOUNTER — Other Ambulatory Visit: Payer: Self-pay | Admitting: Family Medicine

## 2016-04-07 DIAGNOSIS — M1711 Unilateral primary osteoarthritis, right knee: Secondary | ICD-10-CM | POA: Diagnosis not present

## 2016-04-19 ENCOUNTER — Encounter: Payer: Self-pay | Admitting: Cardiovascular Disease

## 2016-04-25 ENCOUNTER — Other Ambulatory Visit: Payer: Self-pay | Admitting: Family Medicine

## 2016-04-30 NOTE — Progress Notes (Signed)
Patient ID: Sean Lane, male   DOB: 02/03/1928, 82 y.o.   MRN: [id number]   Sean Lane returns today for followup. He is a very pleasant 82 y.o.  man with a history of atrial flutter, hypertension, and sinus bradycardia and CAD . The patient has maintaining sinus rhythm since last February. I had initially seen him in January 15  and he was in atrial flutter with a controlled ventricular response. He spontaneously reverted to sinus rhythm. He was placed on anticoagulation with Coumadin. He denies chest pain or shortness of breath  Changed to  Xarelto end of June having microhematuria Sees Grapey for prostate   He has a history of CAD, s/p PCI to CFX in 2009 with Cypher DES x2, hypertension, hyperlipidemia, right bundle branch block, GERD, BPH. LHC 3/09: Mid RCA 50%, left main 20%, proximal LAD 40-50%, mid LAD 40%, proximal circumflex 80% and an 80% after OM2, left PDA 40%, EF 60%. PCI: Cypher DES x2 to the proximal and mid circumflex   Last exercise treadmill test 1/12: Heart rate increased appropriately, no ischemic EKG changes.   Discussed future need of pacer. Avoid all AV nodal blocking drugs. He exercises regularly and has good chronotropic response  7/15  LDL 57    Recent bout of diverticulitis  Rx as outpatient by Dr Yong Channel Off antibiotics now   Last echo 2015 normal EF  Study Conclusions  - Left ventricle: The cavity size was normal. Systolic function was normal. The estimated ejection fraction was in the range of 55% to 60%. Wall motion was normal; there were no regional wall motion abnormalities. - Mitral valve: Mild regurgitation. - Left atrium: The atrium was mildly dilated. - Atrial septum: No defect or patent foramen ovale was identified.  Some fatigue no chest pain or syncope should be getting blood work with Dr Yong Channel   ROS: Denies fever, malais, weight loss, blurry vision, decreased visual acuity, cough, sputum, SOB, hemoptysis, pleuritic pain,  palpitaitons, heartburn, abdominal pain, melena, lower extremity edema, claudication, or rash.  All other systems reviewed and negative  General: Affect appropriate not postural systoolic BP [id number] sitting [id number] standing  Healthy:  appears stated age HEENT: normal Neck supple with no adenopathy JVP normal no bruits no thyromegaly Lungs clear with no wheezing and good diaphragmatic motion Heart:  S1/S2 no murmur, no rub, gallop or click PMI normal Abdomen: benighn, BS positve, no tenderness, no AAA no bruit.  No HSM or HJR Distal pulses intact with no bruits No edema Neuro non-focal Skin warm and dry No muscular weakness   Current Outpatient Prescriptions  Medication Sig Dispense Refill  . amoxicillin (AMOXIL) 500 MG capsule TAKE FOUR CAPSULES BY MOUTH ONE HOUR BEFORE APPOINTMENT 8 capsule 3  . aspirin EC 81 MG tablet Take 1 tablet (81 mg total) by mouth daily.    Marland Kitchen dutasteride (AVODART) 0.5 MG capsule Take 0.5 mg by mouth daily.    . Lancets (FREESTYLE) lancets CHECK BLOOD SUGAR ONCE DAILY 100 each 4  . loratadine (CLARITIN) 10 MG tablet Take 10 mg by mouth daily as needed for allergies.     . multivitamin (THERAGRAN) per tablet Take 1 tablet by mouth daily.      . nitroGLYCERIN (NITROSTAT) 0.4 MG SL tablet Place 1 tablet (0.4 mg total) under the tongue every 5 (five) minutes as needed for chest pain (for chest pain). 25 tablet 3  . Omega-3 Fatty Acids (FISH OIL) 1200 MG CAPS Take 2,400 mg by mouth 2 (two) times  daily.    Marland Kitchen OVER THE COUNTER MEDICATION Place 1-2 drops into both eyes daily as needed (for red eyes). "[facility] brand for red eyes"    . rivaroxaban (XARELTO) 20 MG TABS tablet Take 20 mg by mouth daily with supper.    . rosuvastatin (CRESTOR) 10 MG tablet Take 1 tablet (10 mg total) by mouth daily. 90 tablet 3  . Tamsulosin HCl (FLOMAX) 0.4 MG CAPS Take 0.4 mg by mouth daily.     No current facility-administered medications for this visit.     Allergies  Hydromorphone hcl  and Sulfonamide derivatives  Electrocardiogram:  Sinus arrhythmia RBBB no high grade AV block 04/30/14  05/03/16  SR rate 69 RBBB   Assessment and Plan CAD: Stable with no angina and good activity level.  Continue medical Rx  PAF: maint NSR continue xarelto   Anticoagulation: renal function good no bleeding issues continue xarelto  SSS: monitor with no pauses.  Baseline RBBB  ETT with adequate HR response  Observe  HTN Well controlled.  Continue current medications and low sodium Dash type diet.    Chol:  Lab Results  Component Value Date   LDLCALC 54 04/09/2014   Diverticulitis:  Resolved documented diverticulosis on previous colonoscopy  Discussed low fiber diet  Fatigue: no cardiac etiology will get labs including Hb/Hct and TSH with primary in September   Jenkins Rouge

## 2016-05-03 ENCOUNTER — Ambulatory Visit (INDEPENDENT_AMBULATORY_CARE_PROVIDER_SITE_OTHER): Payer: Medicare Other | Admitting: Cardiovascular Disease

## 2016-05-03 ENCOUNTER — Encounter: Payer: Self-pay | Admitting: Cardiovascular Disease

## 2016-05-03 VITALS — BP 130/60 | HR 73 | Ht 73.0 in | Wt 189.4 lb

## 2016-05-03 DIAGNOSIS — I4510 Unspecified right bundle-branch block: Secondary | ICD-10-CM | POA: Diagnosis not present

## 2016-05-03 NOTE — Patient Instructions (Signed)

## 2016-05-06 ENCOUNTER — Encounter: Payer: Self-pay | Admitting: Family Medicine

## 2016-05-09 ENCOUNTER — Other Ambulatory Visit: Payer: Self-pay | Admitting: Family Medicine

## 2016-05-09 MED ORDER — FREESTYLE LANCETS MISC: 3 refills | Status: DC

## 2016-05-09 NOTE — Telephone Encounter (Signed)
Received a fax from [employer] [employer].  Needed Freestyle lancets resent along with dx code.  Sent in electronically.

## 2016-05-23 DIAGNOSIS — M9901 Segmental and somatic dysfunction of cervical region: Secondary | ICD-10-CM | POA: Diagnosis not present

## 2016-05-23 DIAGNOSIS — M9900 Segmental and somatic dysfunction of head region: Secondary | ICD-10-CM | POA: Diagnosis not present

## 2016-05-23 DIAGNOSIS — M9902 Segmental and somatic dysfunction of thoracic region: Secondary | ICD-10-CM | POA: Diagnosis not present

## 2016-05-23 DIAGNOSIS — M62830 Muscle spasm of back: Secondary | ICD-10-CM | POA: Diagnosis not present

## 2016-05-23 DIAGNOSIS — M546 Pain in thoracic spine: Secondary | ICD-10-CM | POA: Diagnosis not present

## 2016-05-23 DIAGNOSIS — M542 Cervicalgia: Secondary | ICD-10-CM | POA: Diagnosis not present

## 2016-05-24 DIAGNOSIS — M9902 Segmental and somatic dysfunction of thoracic region: Secondary | ICD-10-CM | POA: Diagnosis not present

## 2016-05-24 DIAGNOSIS — M9900 Segmental and somatic dysfunction of head region: Secondary | ICD-10-CM | POA: Diagnosis not present

## 2016-05-24 DIAGNOSIS — M62830 Muscle spasm of back: Secondary | ICD-10-CM | POA: Diagnosis not present

## 2016-05-24 DIAGNOSIS — M9901 Segmental and somatic dysfunction of cervical region: Secondary | ICD-10-CM | POA: Diagnosis not present

## 2016-05-24 DIAGNOSIS — M542 Cervicalgia: Secondary | ICD-10-CM | POA: Diagnosis not present

## 2016-05-24 DIAGNOSIS — M546 Pain in thoracic spine: Secondary | ICD-10-CM | POA: Diagnosis not present

## 2016-05-25 DIAGNOSIS — M9901 Segmental and somatic dysfunction of cervical region: Secondary | ICD-10-CM | POA: Diagnosis not present

## 2016-05-25 DIAGNOSIS — M9902 Segmental and somatic dysfunction of thoracic region: Secondary | ICD-10-CM | POA: Diagnosis not present

## 2016-05-25 DIAGNOSIS — M62830 Muscle spasm of back: Secondary | ICD-10-CM | POA: Diagnosis not present

## 2016-05-25 DIAGNOSIS — M9900 Segmental and somatic dysfunction of head region: Secondary | ICD-10-CM | POA: Diagnosis not present

## 2016-05-25 DIAGNOSIS — M546 Pain in thoracic spine: Secondary | ICD-10-CM | POA: Diagnosis not present

## 2016-05-25 DIAGNOSIS — M542 Cervicalgia: Secondary | ICD-10-CM | POA: Diagnosis not present

## 2016-06-02 ENCOUNTER — Other Ambulatory Visit: Payer: Self-pay | Admitting: Family Medicine

## 2016-06-06 ENCOUNTER — Encounter: Payer: Self-pay | Admitting: Family Medicine

## 2016-06-06 ENCOUNTER — Ambulatory Visit (INDEPENDENT_AMBULATORY_CARE_PROVIDER_SITE_OTHER): Payer: Medicare Other | Admitting: Family Medicine

## 2016-06-06 VITALS — BP 128/60 | HR 65 | Temp 97.5°F | Wt 191.8 lb

## 2016-06-06 DIAGNOSIS — Z23 Encounter for immunization: Secondary | ICD-10-CM

## 2016-06-06 DIAGNOSIS — R5383 Other fatigue: Secondary | ICD-10-CM

## 2016-06-06 DIAGNOSIS — E119 Type 2 diabetes mellitus without complications: Secondary | ICD-10-CM

## 2016-06-06 NOTE — Progress Notes (Addendum)
Subjective:  Sean Lane is a 82 y.o. year old very pleasant male patient who presents for/with See problem oriented charting ROS- reports fatigue. No chest pain or shortness of breath. Gets exhausted even doing seated activities like driving but if sleeps feels recharged. No unintentional weight loss or night sweats. see any ROS included in HPI as well.   Past Medical History-  Patient Active Problem List   Diagnosis Date Noted  . Diabetes ([place]) 12/14/2012    Priority: High  . Atrial flutter ([place]) 10/03/2011    Priority: High  . Coronary atherosclerosis 12/17/2007    Priority: High  . SINUS BRADYCARDIA 07/20/2010    Priority: Medium  . BPH associated with nocturia 06/08/2009    Priority: Medium  . DIZZINESS 12/15/2008    Priority: Medium  . Hyperlipidemia 04/02/2007    Priority: Medium  . Essential hypertension 04/02/2007    Priority: Medium  . Nephrolithiasis 05/07/2014    Priority: Low  . Gout attack 12/14/2012    Priority: Low  . Hematuria 03/28/2012    Priority: Low  . RBBB 12/28/2008    Priority: Low  . GERD 12/15/2008    Priority: Low  . LOSS, CONDUCTIVE HEARING, COMBINED TYPE 04/16/2007    Priority: Low  . Allergic rhinitis 04/02/2007    Priority: Low  . OSTEOARTHRITIS 04/02/2007    Priority: Low  . Diverticulitis of colon 11/22/2015    Medications- reviewed and updated Current Outpatient Prescriptions  Medication Sig Dispense Refill  . amoxicillin (AMOXIL) 500 MG capsule TAKE FOUR CAPSULES BY MOUTH ONE HOUR BEFORE APPOINTMENT 8 capsule 3  . aspirin EC 81 MG tablet Take 1 tablet (81 mg total) by mouth daily.    Marland Kitchen dutasteride (AVODART) 0.5 MG capsule Take 0.5 mg by mouth daily.    . Lancets (FREESTYLE) lancets CHECK BLOOD SUGAR ONCE DAILY 100 each 3  . loratadine (CLARITIN) 10 MG tablet Take 10 mg by mouth daily as needed for allergies.     . multivitamin (THERAGRAN) per tablet Take 1 tablet by mouth daily.      . nitroGLYCERIN (NITROSTAT) 0.4 MG SL  tablet Place 1 tablet (0.4 mg total) under the tongue every 5 (five) minutes as needed for chest pain (for chest pain). 25 tablet 3  . Omega-3 Fatty Acids (FISH OIL) 1200 MG CAPS Take 2,400 mg by mouth 2 (two) times daily.    Marland Kitchen OVER THE COUNTER MEDICATION Place 1-2 drops into both eyes daily as needed (for red eyes). "[facility] brand for red eyes"    . rosuvastatin (CRESTOR) 10 MG tablet Take 1 tablet (10 mg total) by mouth daily. 90 tablet 3  . Tamsulosin HCl (FLOMAX) 0.4 MG CAPS Take 0.4 mg by mouth daily.    Alveda Reasons 20 MG TABS tablet TAKE 1 TABLET DAILY WITH   SUPPER 90 tablet 0   No current facility-administered medications for this visit.     Objective: BP 128/60 (BP Location: Left Arm, Patient Position: Sitting, Cuff Size: Large)   Pulse 65   Temp 97.5 F (36.4 C) (Oral)   Wt 191 lb 12.8 oz (87 kg)   SpO2 97%   BMI 25.30 kg/m  Gen: NAD, resting comfortably CV: RRR no murmurs rubs or gallops Lungs: CTAB no crackles, wheeze, rhonchi Abdomen: soft/nontender/nondistended/normal bowel sounds. No rebound or guarding.  Ext: no edema Skin: warm, dry Neuro: grossly normal, moves all extremities  Diabetic Foot Exam - Simple   Simple Foot Form Diabetic Foot exam was performed with  the following findings:  Yes 06/06/2016 12:24 PM  Visual Inspection No deformities, no ulcerations, no other skin breakdown bilaterally:  Yes Sensation Testing Intact to touch and monofilament testing bilaterally:  Yes Pulse Check Posterior Tibialis and Dorsalis pulse intact bilaterally:  Yes Comments     Assessment/Plan:  Fatigue - Plan: CBC, Comprehensive metabolic panel, Hemoglobin A1c, TSH S:Rested/sleeps feels good, but gets tired very quickly including driving back from [place] and just feels exhausted A/P: will update labs including cbc, cmp, tsh, a1c. Has seen cardiology who did not think cardiac related.    Diabetes ([place]) S: diet controlled Lab Results  Component Value Date   HGBA1C  5.3 11/22/2015  A/P: update a1c today  Orders Placed This Encounter  Procedures  . Flu vaccine HIGH DOSE PF  . CBC    [place]  . Comprehensive metabolic panel    [place]  . TSH    [place]  . Hemoglobin A1c    [place]   Return precautions advised.  [name], MD

## 2016-06-06 NOTE — Assessment & Plan Note (Signed)
S: diet controlled Lab Results  Component Value Date   HGBA1C 5.3 11/22/2015  A/P: update a1c today

## 2016-06-06 NOTE — Progress Notes (Signed)
Pre visit review using our clinic review tool, if applicable. No additional management support is needed unless otherwise documented below in the visit note. 

## 2016-06-06 NOTE — Patient Instructions (Addendum)
Labs before you leave  Will send mychart with results  Glad you are doing well outside the fatigue

## 2016-06-07 LAB — COMPREHENSIVE METABOLIC PANEL
ALBUMIN: 4.3 g/dL (ref 3.5–5.2)
ALK PHOS: 77 U/L (ref 39–117)
ALT: 18 U/L (ref 0–53)
AST: 20 U/L (ref 0–37)
BUN: 18 mg/dL (ref 6–23)
CHLORIDE: 105 meq/L (ref 96–112)
CO2: 28 mEq/L (ref 19–32)
Calcium: 9.4 mg/dL (ref 8.4–10.5)
Creatinine, Ser: 0.87 mg/dL (ref 0.40–1.50)
GFR: 88.44 mL/min (ref >60.00)
GLUCOSE: 118 mg/dL — AB (ref 70–99)
POTASSIUM: 4.3 meq/L (ref 3.5–5.1)
SODIUM: 141 meq/L (ref 135–145)
Total Bilirubin: 0.7 mg/dL (ref 0.2–1.2)
Total Protein: 6.5 g/dL (ref 6.0–8.3)

## 2016-06-07 LAB — CBC
HEMATOCRIT: 43.6 % (ref 39.0–52.0)
HEMOGLOBIN: 15 g/dL (ref 13.0–17.0)
MCHC: 34.4 g/dL (ref 30.0–36.0)
MCV: 93.4 fl (ref 78.0–100.0)
Platelets: 163 10*3/uL (ref 150.0–400.0)
RBC: 4.66 Mil/uL (ref 4.22–5.81)
RDW: 13.7 % (ref 11.5–15.5)
WBC: 5.5 10*3/uL (ref 4.0–10.5)

## 2016-06-07 LAB — TSH: TSH: 0.84 u[IU]/mL (ref 0.35–4.50)

## 2016-06-07 LAB — HEMOGLOBIN A1C: HEMOGLOBIN A1C: 5.8 % (ref 4.6–6.5)

## 2016-06-08 ENCOUNTER — Telehealth: Payer: Self-pay | Admitting: Cardiovascular Disease

## 2016-06-08 NOTE — Telephone Encounter (Signed)
Called pt and spoke with pt's wife informing her that we did not have any samples of Xarelto 20 mg tablets and that she could call back next week to see if we have gotten any in. I advised the wife that if the pt has any other problems, questions or concerns to call the office. Wife verbalized understanding.

## 2016-06-08 NOTE — Telephone Encounter (Signed)
Patient calling the office for samples of medication:   1.  What medication and dosage are you requesting samples for?Xarelto  2.  Are you currently out of this medication? No-he is in the donut hole,can not afford it at the price it is now

## 2016-06-09 ENCOUNTER — Ambulatory Visit (INDEPENDENT_AMBULATORY_CARE_PROVIDER_SITE_OTHER): Payer: Medicare Other

## 2016-06-09 VITALS — BP 130/60 | HR 63 | Ht 73.0 in | Wt 191.4 lb

## 2016-06-09 DIAGNOSIS — Z0000 Encounter for general adult medical examination without abnormal findings: Secondary | ICD-10-CM | POA: Diagnosis not present

## 2016-06-09 NOTE — Patient Instructions (Addendum)
[name] , Thank you for taking time to come for your Medicare Wellness Visit. I appreciate your ongoing commitment to your health goals. Please review the following plan we discussed and let me know if I can assist you in the future.   These are the goals we discussed: Goals    . Weight (lb) < 180 lb (81.6 kg)          Will count calories and carbs; Lost 1000 calories a day 2000 -2200  Will make a plan and get this completed        This is a list of the screening recommended for you and due dates:  Health Maintenance  Topic Date Due  . Complete foot exam   05/23/2016  . Urine Protein Check  11/21/2016  . Eye exam for diabetics  11/29/2016  . Hemoglobin A1C  12/04/2016  . Tetanus Vaccine  10/24/2021  . Flu Shot  Completed  . Shingles Vaccine  Addressed  . Pneumonia vaccines  Completed       Fall Prevention in the Home  Falls can cause injuries. They can happen to people of all ages. There are many things you can do to make your home safe and to help prevent falls.  WHAT CAN I DO ON THE OUTSIDE OF MY HOME?  Regularly fix the edges of walkways and driveways and fix any cracks.  Remove anything that might make you trip as you walk through a door, such as a raised step or threshold.  Trim any bushes or trees on the path to your home.  Use bright outdoor lighting.  Clear any walking paths of anything that might make someone trip, such as rocks or tools.  Regularly check to see if handrails are loose or broken. Make sure that both sides of any steps have handrails.  Any raised decks and porches should have guardrails on the edges.  Have any leaves, snow, or ice cleared regularly.  Use sand or salt on walking paths during winter.  Clean up any spills in your garage right away. This includes oil or grease spills. WHAT CAN I DO IN THE BATHROOM?   Use night lights.  Install grab bars by the toilet and in the tub and shower. Do not use towel bars as grab bars.  Use  non-skid mats or decals in the tub or shower.  If you need to sit down in the shower, use a plastic, non-slip stool.  Keep the floor dry. Clean up any water that spills on the floor as soon as it happens.  Remove soap buildup in the tub or shower regularly.  Attach bath mats securely with double-sided non-slip rug tape.  Do not have throw rugs and other things on the floor that can make you trip. WHAT CAN I DO IN THE BEDROOM?  Use night lights.  Make sure that you have a light by your bed that is easy to reach.  Do not use any sheets or blankets that are too big for your bed. They should not hang down onto the floor.  Have a firm chair that has side arms. You can use this for support while you get dressed.  Do not have throw rugs and other things on the floor that can make you trip. WHAT CAN I DO IN THE KITCHEN?  Clean up any spills right away.  Avoid walking on wet floors.  Keep items that you use a lot in easy-to-reach places.  If you need to reach  something above you, use a strong step stool that has a grab bar.  Keep electrical cords out of the way.  Do not use floor polish or wax that makes floors slippery. If you must use wax, use non-skid floor wax.  Do not have throw rugs and other things on the floor that can make you trip. WHAT CAN I DO WITH MY STAIRS?  Do not leave any items on the stairs.  Make sure that there are handrails on both sides of the stairs and use them. Fix handrails that are broken or loose. Make sure that handrails are as long as the stairways.  Check any carpeting to make sure that it is firmly attached to the stairs. Fix any carpet that is loose or worn.  Avoid having throw rugs at the top or bottom of the stairs. If you do have throw rugs, attach them to the floor with carpet tape.  Make sure that you have a light switch at the top of the stairs and the bottom of the stairs. If you do not have them, ask someone to add them for you. WHAT  ELSE CAN I DO TO HELP PREVENT FALLS?  Wear shoes that:  Do not have high heels.  Have rubber bottoms.  Are comfortable and fit you well.  Are closed at the toe. Do not wear sandals.  If you use a stepladder:  Make sure that it is fully opened. Do not climb a closed stepladder.  Make sure that both sides of the stepladder are locked into place.  Ask someone to hold it for you, if possible.  Clearly mark and make sure that you can see:  Any grab bars or handrails.  First and last steps.  Where the edge of each step is.  Use tools that help you move around (mobility aids) if they are needed. These include:  Canes.  Walkers.  Scooters.  Crutches.  Turn on the lights when you go into a dark area. Replace any light bulbs as soon as they burn out.  Set up your furniture so you have a clear path. Avoid moving your furniture around.  If any of your floors are uneven, fix them.  If there are any pets around you, be aware of where they are.  Review your medicines with your doctor. Some medicines can make you feel dizzy. This can increase your chance of falling. Ask your doctor what other things that you can do to help prevent falls.   This information is not intended to replace advice given to you by your health care provider. Make sure you discuss any questions you have with your health care provider.   Document Released: 06/24/2009 Document Revised: 01/12/2015 Document Reviewed: 10/02/2014 Elsevier Interactive Patient Education 2016 [place] Maintenance, Male A healthy lifestyle and preventative care can promote health and wellness.  Maintain regular health, dental, and eye exams.  Eat a healthy diet. Foods like vegetables, fruits, whole grains, low-fat dairy products, and lean protein foods contain the nutrients you need and are low in calories. Decrease your intake of foods high in solid fats, added sugars, and salt. Get information about a proper  diet from your health care provider, if necessary.  Regular physical exercise is one of the most important things you can do for your health. Most adults should get at least 150 minutes of moderate-intensity exercise (any activity that increases your heart rate and causes you to sweat) each week. In addition, most adults need  muscle-strengthening exercises on 2 or more days a week.   Maintain a healthy weight. The body mass index (BMI) is a screening tool to identify possible weight problems. It provides an estimate of body fat based on height and weight. Your health care provider can find your BMI and can help you achieve or maintain a healthy weight. For males 20 years and older:  A BMI below 18.5 is considered underweight.  A BMI of 18.5 to 24.9 is normal.  A BMI of 25 to 29.9 is considered overweight.  A BMI of 30 and above is considered obese.  Maintain normal blood lipids and cholesterol by exercising and minimizing your intake of saturated fat. Eat a balanced diet with plenty of fruits and vegetables. Blood tests for lipids and cholesterol should begin at age 27 and be repeated every 5 years. If your lipid or cholesterol levels are high, you are over age 43, or you are at high risk for heart disease, you may need your cholesterol levels checked more frequently.Ongoing high lipid and cholesterol levels should be treated with medicines if diet and exercise are not working.  If you smoke, find out from your health care provider how to quit. If you do not use tobacco, do not start.  Lung cancer screening is recommended for adults aged 29-80 years who are at high risk for developing lung cancer because of a history of smoking. A yearly low-dose CT scan of the lungs is recommended for people who have at least a 30-pack-year history of smoking and are current smokers or have quit within the past 15 years. A pack year of smoking is smoking an average of 1 pack of cigarettes a day for 1 year (for  example, a 30-pack-year history of smoking could mean smoking 1 pack a day for 30 years or 2 packs a day for 15 years). Yearly screening should continue until the smoker has stopped smoking for at least 15 years. Yearly screening should be stopped for people who develop a health problem that would prevent them from having lung cancer treatment.  If you choose to drink alcohol, do not have more than 2 drinks per day. One drink is considered to be 12 oz (360 mL) of beer, 5 oz (150 mL) of wine, or 1.5 oz (45 mL) of liquor.  Avoid the use of street drugs. Do not share needles with anyone. Ask for help if you need support or instructions about stopping the use of drugs.  High blood pressure causes heart disease and increases the risk of stroke. High blood pressure is more likely to develop in:  People who have blood pressure in the end of the normal range (100-139/85-89 mm Hg).  People who are overweight or obese.  People who are African American.  If you are 55-81 years of age, have your blood pressure checked every 3-5 years. If you are 44 years of age or older, have your blood pressure checked every year. You should have your blood pressure measured twice--once when you are at a hospital or clinic, and once when you are not at a hospital or clinic. Record the average of the two measurements. To check your blood pressure when you are not at a hospital or clinic, you can use:  An automated blood pressure machine at a pharmacy.  A home blood pressure monitor.  If you are 21-25 years old, ask your health care provider if you should take aspirin to prevent heart disease.  Diabetes screening involves taking  a blood sample to check your fasting blood sugar level. This should be done once every 3 years after age 91 if you are at a normal weight and without risk factors for diabetes. Testing should be considered at a younger age or be carried out more frequently if you are overweight and have at least 1  risk factor for diabetes.  Colorectal cancer can be detected and often prevented. Most routine colorectal cancer screening begins at the age of 61 and continues through age 60. However, your health care provider may recommend screening at an earlier age if you have risk factors for colon cancer. On a yearly basis, your health care provider may provide home test kits to check for hidden blood in the stool. A small camera at the end of a tube may be used to directly examine the colon (sigmoidoscopy or colonoscopy) to detect the earliest forms of colorectal cancer. Talk to your health care provider about this at age 85 when routine screening begins. A direct exam of the colon should be repeated every 5-10 years through age 38, unless early forms of precancerous polyps or small growths are found.  People who are at an increased risk for hepatitis B should be screened for this virus. You are considered at high risk for hepatitis B if:  You were born in a country where hepatitis B occurs often. Talk with your health care provider about which countries are considered high risk.  Your parents were born in a high-risk country and you have not received a shot to protect against hepatitis B (hepatitis B vaccine).  You have HIV or AIDS.  You use needles to inject street drugs.  You live with, or have sex with, someone who has hepatitis B.  You are a man who has sex with other men (MSM).  You get hemodialysis treatment.  You take certain medicines for conditions like cancer, organ transplantation, and autoimmune conditions.  Hepatitis C blood testing is recommended for all people born from [birth year] through 1963 and any individual with known risk factors for hepatitis C.  Healthy men should no longer receive prostate-specific antigen (PSA) blood tests as part of routine cancer screening. Talk to your health care provider about prostate cancer screening.  Testicular cancer screening is not recommended for  adolescents or adult males who have no symptoms. Screening includes self-exam, a health care provider exam, and other screening tests. Consult with your health care provider about any symptoms you have or any concerns you have about testicular cancer.  Practice safe sex. Use condoms and avoid high-risk sexual practices to reduce the spread of sexually transmitted infections (STIs).  You should be screened for STIs, including gonorrhea and chlamydia if:  You are sexually active and are younger than 24 years.  You are older than 24 years, and your health care provider tells you that you are at risk for this type of infection.  Your sexual activity has changed since you were last screened, and you are at an increased risk for chlamydia or gonorrhea. Ask your health care provider if you are at risk.  If you are at risk of being infected with HIV, it is recommended that you take a prescription medicine daily to prevent HIV infection. This is called pre-exposure prophylaxis (PrEP). You are considered at risk if:  You are a man who has sex with other men (MSM).  You are a heterosexual man who is sexually active with multiple partners.  You take drugs by injection.  You are sexually active with a partner who has HIV.  Talk with your health care provider about whether you are at high risk of being infected with HIV. If you choose to begin PrEP, you should first be tested for HIV. You should then be tested every 3 months for as long as you are taking PrEP.  Use sunscreen. Apply sunscreen liberally and repeatedly throughout the day. You should seek shade when your shadow is shorter than you. Protect yourself by wearing long sleeves, pants, a wide-brimmed hat, and sunglasses year round whenever you are outdoors.  Tell your health care provider of new moles or changes in moles, especially if there is a change in shape or color. Also, tell your health care provider if a mole is larger than the size of a  pencil eraser.  A one-time screening for abdominal aortic aneurysm (AAA) and surgical repair of large AAAs by ultrasound is recommended for men aged 24-75 years who are current or former smokers.  Stay current with your vaccines (immunizations).   This information is not intended to replace advice given to you by your health care provider. Make sure you discuss any questions you have with your health care provider.   Document Released: 02/24/2008 Document Revised: 09/18/2014 Document Reviewed: 01/23/2011 Elsevier Interactive Patient Education [employer].

## 2016-06-09 NOTE — Progress Notes (Signed)
I have reviewed and agree with note, evaluation, plan. I did complete diabetic foot exam at visit this week- I went back and added exam in. I appreciate the reminder.   [name], MD

## 2016-06-09 NOTE — Progress Notes (Signed)
Subjective:   Sean Lane is a 82 y.o. male who presents for Medicare Annual/Subsequent preventive examination.  HRA assessment completed during this visit with  The Patient was informed that the wellness visit is to identify future health risk and educate and initiate measures that can reduce risk for increased disease through the lifespan.    NO ROS; Medicare Wellness Visit Describes health as good, fair or great? Good   Risk Associated with PMH  In to see Dr. Yong Channel on 09/26 ( Dr. Yong Channel did check feet)  Disease processes:  HTN medical managed  Had atrial flutter but it Is better now; still on blood thinner. Occurred while trying to dig a root in yard; pressing hard; pulse started racing; now stable  Mobility difficulty ; feels stiff   Hearing loss; hearing aids; no issues at this time   Psychosocial; married;  8 years Has a son in [place]  2 grand children; 2 greats Plan to age in place;   Does get on roof; gets oak leaves that gets in gutters   Primary Prevention Tobacco former smoker; quit in 65; ETOH - no  Diet Would like to lose weight Was at 240lb and Dr. Arnoldo Morale A1c was trending up BS was well in diabetic range; lost weight;  a1 c 5.8; fasting 100 to 125 normally; no diabetic med at present with weight loss and exercise  Cereal and milk for breakfast Wife cooks well;  Eats out a lot    Exercise Yard work;  45 minutes cardio; biking; 3 days a week /Lift weights Has issues with right knee/ considering surgery; Possible partial knee replacement.    Dental; regular visit   Safety Fall hx; had one fall  with mowing machine; but no injuries; mower jumped back on him.   Given education on "Fall Prevention in the Home" for more safety tips the patient can apply as appropriate. Does climb on roof and discussed risk  States he actually sits or roof and feels safe.   Given information on Community safety; driving safety, sun protection, firearm safety, smoke  detectors as well as the "yellow dot" program for residents in [facility].   Screenings for secondary risk Foot exam dr. Yong Channel completed   Colonoscopy 11/2007; aged out unless issues  EKG 04/2016  Vaccinations update: up to date   Medications reviewed for issues;   Does have some hematuria on occasion; Urologist and screened and determined this is cause by Xarelto and large kidney stone which is stable at present.    Depression; anxiety or mood issues assessed  Do you have little interest or pleasure in doing things?no Have you been feeling down, depressed, hopeless? no PHQ9 waived or completed    Cognitive screen completed; MMSE documented or assessed for failures or issues with the AD8 screen below:   Ad8 score reviewed for issues;  Issues making decisions; no  Less interest in hobbies / activities" no  Repeats questions, stories; family complaining: NO  Trouble using ordinary gadgets; microwave; computer: no  Forgets the month or year: no  Mismanaging finances: no  Missing apt: no but does write them down  Daily problems with thinking of memory NO Ad8 score is 0  MMSE not appropriate unless AD8 score is > 2 No issues verbalized;   Advanced Directive reviewed for completion; wife to bring in a copy of HCPOA and LW;   Established and updated Risk reviewed and appropriate referral made or health recommendations as appropriate based on individual  needs and choices;   Current Care Team reviewed and updated        Objective:    Vitals: BP 130/60   Pulse 63   Ht 6\' 1"  (1.854 m)   Wt 191 lb 7 oz (86.8 kg)   SpO2 98%   BMI 25.26 kg/m   Body mass index is 25.26 kg/m.  Tobacco History  Smoking Status  . Former Smoker  . Quit date: 09/11/1961  Smokeless Tobacco  . Never Used     Counseling given: Yes   Past Medical History:  Diagnosis Date  . Allergy   . Atrial flutter ([place])   . BRONCHITIS, ACUTE WITH MILD BRONCHOSPASM 11/22/2009   Qualifier:  Diagnosis of  By: Arnoldo Morale MD, Balinda Quails Conductive hearing loss, external ear   . Coronary atherosclerosis of unspecified type of vessel, native or graft   . Diabetes ([place]) 12/14/2012  . Diabetes mellitus without complication ([place])   . Diverticulosis of colon (without mention of hemorrhage)   . Dizziness and giddiness   . Family history of ischemic heart disease   . GERD (gastroesophageal reflux disease)   . Gout attack 12/14/2012  . Headache(784.0)   . Hemorrhoids   . 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 Hyperlipidemia   . Hypertension   . Osteoarthrosis, unspecified whether generalized or localized, hand   . Osteoarthrosis, unspecified whether generalized or localized, unspecified site   . PEPTIC ULCER DISEASE, HX OF 12/15/2008  . Personal history of urinary calculi   . Renal calculi   . Ulcer    Past Surgical History:  Procedure Laterality Date  . CATARACT EXTRACTION  2005  . FOOT SURGERY  1949  . [place]  . TOTAL KNEE ARTHROPLASTY    . VASECTOMY  1971   Family History  Problem Relation Age of Onset  . Hypertension Mother   . Arthritis Mother   . Heart disease Father   . Diabetes Other     runs in family  . Stroke Other    History  Sexual Activity  . Sexual activity: Yes    Outpatient Encounter Prescriptions as of 06/09/2016  Medication Sig  . amoxicillin (AMOXIL) 500 MG capsule TAKE FOUR CAPSULES BY MOUTH ONE HOUR BEFORE APPOINTMENT  . aspirin EC 81 MG tablet Take 1 tablet (81 mg total) by mouth daily.  Marland Kitchen dutasteride (AVODART) 0.5 MG capsule Take 0.5 mg by mouth daily.  . Lancets (FREESTYLE) lancets CHECK BLOOD SUGAR ONCE DAILY  . loratadine (CLARITIN) 10 MG tablet Take 10 mg by mouth daily as needed for allergies.   . multivitamin (THERAGRAN) per tablet Take 1 tablet by mouth daily.    . nitroGLYCERIN (NITROSTAT) 0.4 MG SL tablet Place 1 tablet (0.4 mg total) under the tongue every 5 (five) minutes as  needed for chest pain (for chest pain).  . Omega-3 Fatty Acids (FISH OIL) 1200 MG CAPS Take 2,400 mg by mouth 2 (two) times daily.  Marland Kitchen OVER THE COUNTER MEDICATION Place 1-2 drops into both eyes daily as needed (for red eyes). "[employer] brand for red eyes"  . rosuvastatin (CRESTOR) 10 MG tablet Take 1 tablet (10 mg total) by mouth daily.  . Tamsulosin HCl (FLOMAX) 0.4 MG CAPS Take 0.4 mg by mouth daily.  Alveda Reasons 20 MG TABS tablet TAKE 1 TABLET DAILY WITH   SUPPER   No facility-administered encounter medications on file as of 06/09/2016.  Activities of Daily Living In your present state of health, do you have any difficulty performing the following activities: 06/09/2016  Hearing? (No Data)  Vision? N  Difficulty concentrating or making decisions? N  Walking or climbing stairs? N  Dressing or bathing? N  Doing errands, shopping? N  Preparing Food and eating ? N  Using the Toilet? N  In the past six months, have you accidently leaked urine? N  Do you have problems with loss of bowel control? N  Managing your Medications? N  Managing your Finances? N  Housekeeping or managing your Housekeeping? N  Some recent data might be hidden    Patient Care Team: [name], MD as PCP - General (Family Medicine)   Assessment:    Very proactive with his health. Educated regarding risk of climbing. Still wants to climb but states he does so once a year. Verbalizes understanding or risk.  Wants to low a few pounds to keep diabetes in check  Has his on plan to lose weight   Exercise Activities and Dietary recommendations Current Exercise Habits: Structured exercise class, Type of exercise: strength training/weights;Other - see comments (biking ), Time (Minutes): 60, Frequency (Times/Week): 3, Weekly Exercise (Minutes/Week): 180, Intensity: Moderate  Goals    . Weight (lb) < 180 lb (81.6 kg)          Will count calories and carbs; Lost 1000 calories a day 2000 -2200  Will make a  plan and get this completed       Fall Risk Fall Risk  06/09/2016 11/22/2015 11/10/2014 11/03/2013  Falls in the past year? Yes No No No  Number falls in past yr: 1 - - -  Follow up Education provided;Falls prevention discussed - - -   Depression Screen PHQ 2/9 Scores 06/09/2016 11/22/2015 11/10/2014 11/03/2013  PHQ - 2 Score 0 0 0 0    Cognitive Testing MMSE - Mini Mental State Exam 06/09/2016  Not completed: (No Data)    Ad8 score 0   Immunization History  Administered Date(s) Administered  . Influenza Split 05/28/2012  . Influenza Whole 06/08/2009  . Influenza, High Dose Seasonal PF 06/27/2013, 06/06/2016  . Influenza,inj,Quad PF,36+ Mos 05/07/2014, 05/24/2015  . Pneumococcal Conjugate-13 11/03/2013, 05/07/2014  . Pneumococcal Polysaccharide-23 05/24/2015  . Pneumococcal-Unspecified 06/20/1982  . Td 09/11/2002  . Tdap 10/25/2011   Screening Tests Health Maintenance  Topic Date Due  . FOOT EXAM  05/23/2016  . URINE MICROALBUMIN  11/21/2016  . OPHTHALMOLOGY EXAM  11/29/2016  . HEMOGLOBIN A1C  12/04/2016  . TETANUS/TDAP  10/24/2021  . INFLUENZA VACCINE  Completed  . ZOSTAVAX  Addressed  . PNA vac Low Risk Adult  Completed      Plan:     UP to date on vaccinations States Dr. Yong Channel did complete his foot exam at his OV this week and there were no issues;  Will postpone foot exam  During the course of the visit the patient was educated and counseled about the following appropriate screening and preventive services:   Vaccines to include Pneumoccal, Influenza, Hepatitis B, Td, Zostavax, HCV  Electrocardiogram  Cardiovascular Disease  Colorectal cancer screening aged out  Diabetes screening/ able to maintain FBS < 125; A1c 5.8  Prostate Cancer Screening  Glaucoma screening/ regular eye exam   Nutrition counseling   Smoking cessation counseling/n/a  Patient Instructions (the written plan) was given to the patient.    Wynetta Fines, RN  06/09/2016

## 2016-06-12 DIAGNOSIS — R3129 Other microscopic hematuria: Secondary | ICD-10-CM | POA: Diagnosis not present

## 2016-06-12 DIAGNOSIS — R351 Nocturia: Secondary | ICD-10-CM | POA: Diagnosis not present

## 2016-06-12 DIAGNOSIS — N401 Enlarged prostate with lower urinary tract symptoms: Secondary | ICD-10-CM | POA: Diagnosis not present

## 2016-06-12 DIAGNOSIS — N200 Calculus of kidney: Secondary | ICD-10-CM | POA: Diagnosis not present

## 2016-07-04 ENCOUNTER — Other Ambulatory Visit: Payer: Self-pay | Admitting: Family Medicine

## 2016-07-04 MED ORDER — RIVAROXABAN 20 MG PO TABS: ORAL_TABLET | ORAL | 0 refills | Status: DC

## 2016-07-20 ENCOUNTER — Encounter: Payer: Self-pay | Admitting: Cardiovascular Disease

## 2016-07-20 ENCOUNTER — Encounter: Payer: Self-pay | Admitting: Family Medicine

## 2016-07-20 DIAGNOSIS — M1711 Unilateral primary osteoarthritis, right knee: Secondary | ICD-10-CM | POA: Diagnosis not present

## 2016-07-29 ENCOUNTER — Other Ambulatory Visit: Payer: Self-pay | Admitting: Family Medicine

## 2016-08-01 ENCOUNTER — Other Ambulatory Visit: Payer: Self-pay | Admitting: Orthopedic Surgery

## 2016-08-01 DIAGNOSIS — M1711 Unilateral primary osteoarthritis, right knee: Secondary | ICD-10-CM

## 2016-08-02 ENCOUNTER — Other Ambulatory Visit: Payer: Self-pay

## 2016-08-02 MED ORDER — GLUCOSE BLOOD VI STRP: ORAL_STRIP | 11 refills | Status: DC

## 2016-08-08 ENCOUNTER — Encounter: Payer: Self-pay | Admitting: Family Medicine

## 2016-08-11 ENCOUNTER — Ambulatory Visit
Admission: RE | Admit: 2016-08-11 | Discharge: 2016-08-11 | Disposition: A | Discharge status: Home / Self Care | Payer: Medicare Other | Source: Ambulatory Visit | Attending: Orthopedic Surgery | Admitting: Orthopedic Surgery

## 2016-08-11 DIAGNOSIS — M1711 Unilateral primary osteoarthritis, right knee: Secondary | ICD-10-CM

## 2016-08-11 DIAGNOSIS — M19071 Primary osteoarthritis, right ankle and foot: Secondary | ICD-10-CM | POA: Diagnosis not present

## 2016-08-11 DIAGNOSIS — M1611 Unilateral primary osteoarthritis, right hip: Secondary | ICD-10-CM | POA: Diagnosis not present

## 2016-08-11 IMAGING — CT CT KNEE*R* W/O CM
3 of 5 series · 6 of 14 positions shown, 7 images · non-contrast
Comparison: None.

CLINICAL DATA: Conformis protocol. Osteoarthritis of the right
knee.

EXAM:
CT OF THE RIGHT KNEE WITHOUT CONTRAST
TECHNIQUE: Multidetector CT imaging of the right knee was performed according
to the standard protocol. Multiplanar CT image reconstructions were
also generated.

[Series 9: knee soft · axial · 0.39mm/px · z∈[-637,-529]mm · 2 of 128 slices shown]
[im 43/128  soft-tissue]
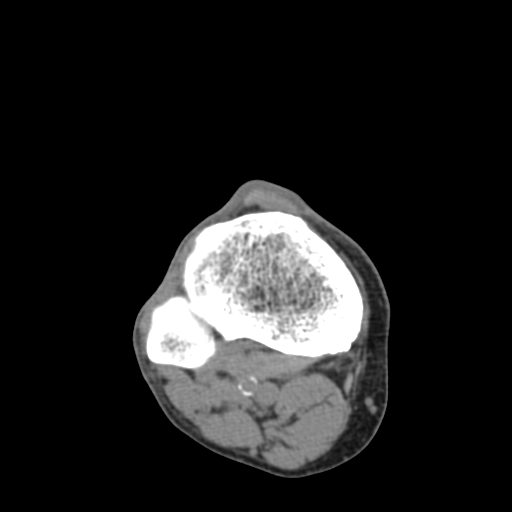
[im 85/128  soft-tissue]
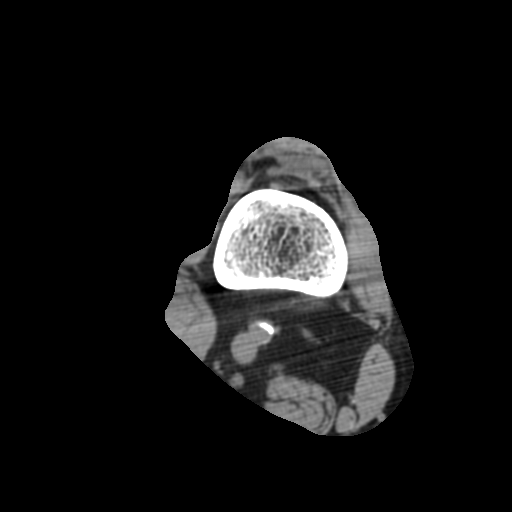

[Series 300: sag right knee bone · sagittal · 0.65mm/px · 2 of 146 slices shown, 3 images]
[im 49/146  soft-tissue]
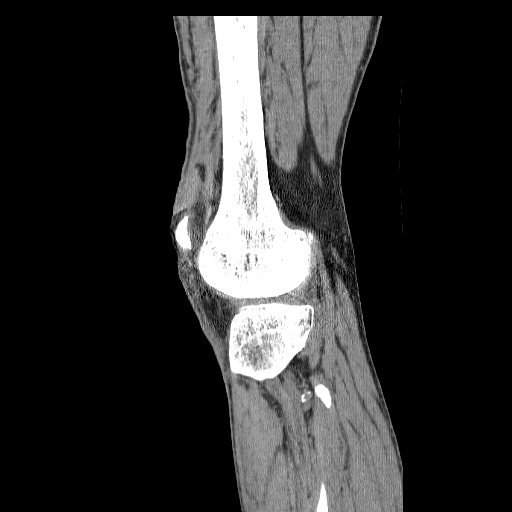
[im 49/146  bone]
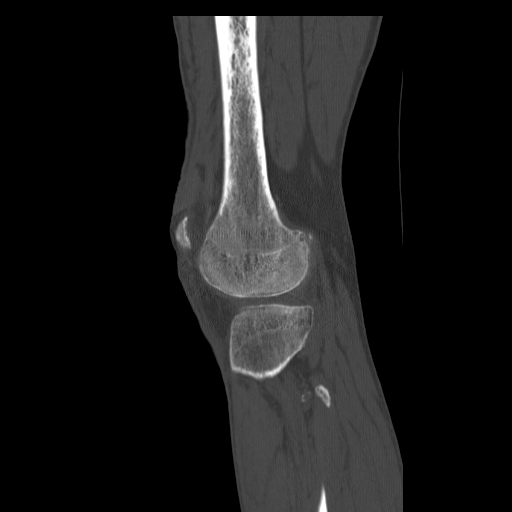
[im 97/146  bone]
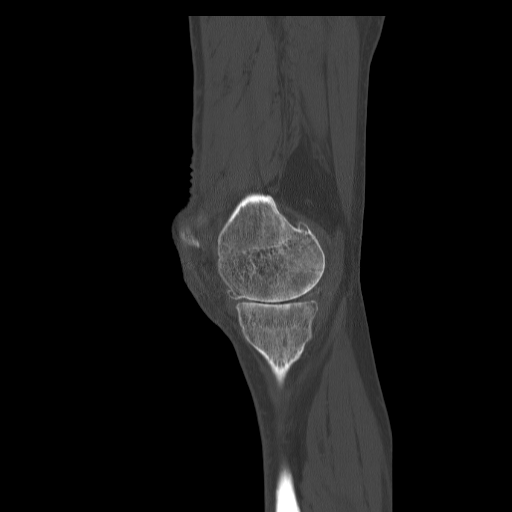

[Series 301: cor right knee bone · coronal · 0.65mm/px · 2 of 146 slices shown]
[im 49/146  bone]
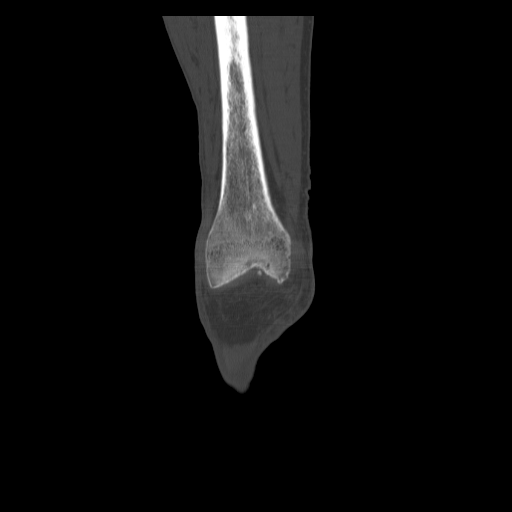
[im 97/146  bone]
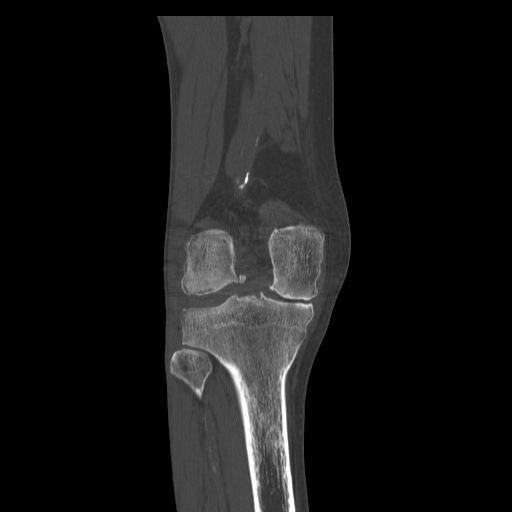

[6 of 14 positions shown; findings below may reference images not displayed]

FINDINGS: Bones/Joint/Cartilage

There is severe osteoarthritis of the medial compartment of the
right knee with diffuse full-thickness cartilage loss. There are
less severe degenerative changes of the patellofemoral and lateral
compartments. Slight varus deformity. Chondrocalcinosis. Vascular
calcifications around the knee. Minimal joint effusion.
IMPRESSION: Severe osteoarthritis of the medial compartment of the right knee.
Images are available for measurement and prosthesis planning.

## 2016-08-14 ENCOUNTER — Encounter: Payer: Self-pay | Admitting: Family Medicine

## 2016-08-14 DIAGNOSIS — H903 Sensorineural hearing loss, bilateral: Secondary | ICD-10-CM | POA: Diagnosis not present

## 2016-08-17 ENCOUNTER — Other Ambulatory Visit: Payer: Self-pay | Admitting: Family Medicine

## 2016-09-15 ENCOUNTER — Telehealth: Payer: Self-pay | Admitting: Family Medicine

## 2016-09-15 MED ORDER — TAMSULOSIN HCL 0.4 MG PO CAPS
0.4000 mg | ORAL_CAPSULE | Freq: Every day | ORAL | 1 refills | Status: DC
Start: 2016-09-15 — End: 2016-12-04

## 2016-09-15 MED ORDER — RIVAROXABAN 20 MG PO TABS: ORAL_TABLET | ORAL | 0 refills | Status: DC

## 2016-09-15 MED ORDER — FREESTYLE LANCETS MISC: 4 refills | Status: DC

## 2016-09-15 MED ORDER — GLUCOSE BLOOD VI STRP: ORAL_STRIP | 4 refills | Status: DC

## 2016-09-15 NOTE — Telephone Encounter (Signed)
Pts wife calling to check the status of the letters that she dropped off up front for the pt and herself that was addressed to Dr. Yong Channel it was in reference to their Rx's

## 2016-09-15 NOTE — Telephone Encounter (Signed)
Left message on voicemail Rx's were sent to pharmacy's as requested. Need to speak to pt about one medication please call office.

## 2016-09-23 ENCOUNTER — Encounter: Payer: Self-pay | Admitting: Family Medicine

## 2016-10-12 ENCOUNTER — Encounter: Payer: Self-pay | Admitting: Family Medicine

## 2016-10-13 ENCOUNTER — Encounter: Payer: Self-pay | Admitting: Family Medicine

## 2016-10-15 NOTE — Progress Notes (Signed)
Patient ID: Sean Lane, male   DOB: Apr 28, 1928, 83 y.o.   MRN: [id number]   Mr. Seyller returns today for followup. He is a very pleasant 83 y.o.  man with a history of atrial flutter, hypertension, and sinus bradycardia and CAD . The patient has maintaining sinus rhythm since last February. I had initially seen him in January 15  and he was in atrial flutter with a controlled ventricular response. He spontaneously reverted to sinus rhythm. He was placed on anticoagulation with Coumadin. He denies chest pain or shortness of breath  Changed to  Xarelto end of June 2017  having microhematuria Sees Grapey for prostate   He has a history of CAD, s/p PCI to CFX in 2009 with Cypher DES x2, hypertension, hyperlipidemia, right bundle branch block, GERD, BPH. LHC 3/09: Mid RCA 50%, left main 20%, proximal LAD 40-50%, mid LAD 40%, proximal circumflex 80% and an 80% after OM2, left PDA 40%, EF 60%. PCI: Cypher DES x2 to the proximal and mid circumflex   Last exercise treadmill test 1/12: Heart rate increased appropriately, no ischemic EKG changes.   Discussed future need of pacer. Avoid all AV nodal blocking drugs. He exercises regularly and has good chronotropic response  Last echo 2015 normal EF  Study Conclusions  - Left ventricle: The cavity size was normal. Systolic function was normal. The estimated ejection fraction was in the range of 55% to 60%. Wall motion was normal; there were no regional wall motion abnormalities. - Mitral valve: Mild regurgitation. - Left atrium: The atrium was mildly dilated. - Atrial septum: No defect or patent foramen ovale was identified.  Right knee acutely worse 2 months ago Will have right knee surgery 2/21 with [facility] Discussed merits of doing myovue before   ROS: Denies fever, malais, weight loss, blurry vision, decreased visual acuity, cough, sputum, SOB, hemoptysis, pleuritic pain, palpitaitons, heartburn, abdominal pain, melena, lower  extremity edema, claudication, or rash.  All other systems reviewed and negative  General: Affect appropriate not postural systoolic BP [id number] sitting 0000000 standing  Healthy:  appears stated age HEENT: normal Neck supple with no adenopathy JVP normal no bruits no thyromegaly Lungs clear with no wheezing and good diaphragmatic motion Heart:  S1/S2 no murmur, no rub, gallop or click PMI normal Abdomen: benighn, BS positve, no tenderness, no AAA no bruit.  No HSM or HJR Distal pulses intact with no bruits No edema Neuro non-focal Skin warm and dry No muscular weakness   Current Outpatient Prescriptions  Medication Sig Dispense Refill  . acetaminophen (TYLENOL) 500 MG tablet Take 1,000 mg by mouth every 6 (six) hours as needed for moderate pain.    Marland Kitchen acetaminophen (TYLENOL) 650 MG CR tablet Take 1,300 mg by mouth every 8 (eight) hours as needed for pain.    Marland Kitchen amoxicillin (AMOXIL) 500 MG capsule TAKE FOUR CAPSULES BY MOUTH ONE HOUR BEFORE APPOINTMENT 8 capsule 3  . aspirin EC 81 MG tablet Take 1 tablet (81 mg total) by mouth daily.    Marland Kitchen dutasteride (AVODART) 0.5 MG capsule TAKE 1 CAPSULE DAILY 90 capsule 1  . glucose blood (FREESTYLE INSULINX TEST) test strip USE TO CHECK BLOOD SUGAR DAILY AND AS NEEDED 100 each 4  . Lancets (FREESTYLE) lancets CHECK BLOOD SUGAR ONCE DAILY 100 each 4  . loratadine (CLARITIN) 10 MG tablet Take 10 mg by mouth daily as needed for allergies.     . Naphazoline-Pheniramine (OPCON-A OP) Apply 1 drop to eye daily as needed (allergies).    Marland Kitchen  Omega-3 Fatty Acids (FISH OIL) 1200 MG CAPS Take 2,400 mg by mouth 2 (two) times daily.    Marland Kitchen OVER THE COUNTER MEDICATION Place 1-2 drops into both eyes daily as needed (for red eyes). "[employer] brand for red eyes"    . oxymetazoline (AFRIN) 0.05 % nasal spray Place 1 spray into both nostrils 2 (two) times daily as needed for congestion.    . rivaroxaban (XARELTO) 20 MG TABS tablet TAKE 1 TABLET DAILY WITH   SUPPER (Patient taking  differently: Take 20 mg by mouth daily with supper. TAKE 1 TABLET DAILY WITH   SUPPER) 90 tablet 0  . rosuvastatin (CRESTOR) 10 MG tablet Take 1 tablet (10 mg total) by mouth daily. (Patient taking differently: Take 10 mg by mouth every evening. ) 90 tablet 3  . tamsulosin (FLOMAX) 0.4 MG CAPS capsule Take 1 capsule (0.4 mg total) by mouth daily. 90 capsule 1  . nitroGLYCERIN (NITROSTAT) 0.4 MG SL tablet Place 1 tablet (0.4 mg total) under the tongue every 5 (five) minutes as needed for chest pain (for chest pain). 25 tablet 6   No current facility-administered medications for this visit.     Allergies  Hydromorphone hcl and Sulfonamide derivatives  Electrocardiogram:  Sinus arrhythmia RBBB no high grade AV block 04/30/14  05/03/16  SR rate 69 RBBB   Assessment and Plan CAD: Poor functional status last 2 months with distant intervention to circumflex f/u  lexiscan myovue to clear for surgery   PAF: maint NSR continue xarelto Since there is a question of spinal anesthesia will need To hold DOAC 3 days before surgery   Anticoagulation: renal function good no bleeding issues continue xarelto  SSS: monitor with no pauses.  Baseline RBBB  ETT with adequate HR response  Observe  HTN Well controlled.  Continue current medications and low sodium Dash type diet.    Chol:  Lab Results  Component Value Date   LDLCALC 54 04/09/2014   Diverticulitis:  Resolved documented diverticulosis on previous colonoscopy  Discussed low fiber diet  Fatigue: no cardiac etiology TSH normal 06/06/16 A1c Hct other labs also ok f/u primary   Jenkins Rouge

## 2016-10-17 DIAGNOSIS — M9901 Segmental and somatic dysfunction of cervical region: Secondary | ICD-10-CM | POA: Diagnosis not present

## 2016-10-17 DIAGNOSIS — M9900 Segmental and somatic dysfunction of head region: Secondary | ICD-10-CM | POA: Diagnosis not present

## 2016-10-17 DIAGNOSIS — M62830 Muscle spasm of back: Secondary | ICD-10-CM | POA: Diagnosis not present

## 2016-10-17 DIAGNOSIS — M546 Pain in thoracic spine: Secondary | ICD-10-CM | POA: Diagnosis not present

## 2016-10-17 DIAGNOSIS — M9902 Segmental and somatic dysfunction of thoracic region: Secondary | ICD-10-CM | POA: Diagnosis not present

## 2016-10-17 DIAGNOSIS — M542 Cervicalgia: Secondary | ICD-10-CM | POA: Diagnosis not present

## 2016-10-19 ENCOUNTER — Ambulatory Visit: Payer: Self-pay | Admitting: Orthopedic Surgery

## 2016-10-19 ENCOUNTER — Ambulatory Visit (INDEPENDENT_AMBULATORY_CARE_PROVIDER_SITE_OTHER): Payer: Medicare Other | Admitting: Cardiovascular Disease

## 2016-10-19 ENCOUNTER — Encounter: Payer: Self-pay | Admitting: Cardiovascular Disease

## 2016-10-19 ENCOUNTER — Other Ambulatory Visit (HOSPITAL_COMMUNITY): Payer: Self-pay | Admitting: *Deleted

## 2016-10-19 VITALS — BP 132/72 | HR 67 | Ht 73.0 in | Wt 190.0 lb

## 2016-10-19 DIAGNOSIS — M542 Cervicalgia: Secondary | ICD-10-CM | POA: Diagnosis not present

## 2016-10-19 DIAGNOSIS — I2510 Atherosclerotic heart disease of native coronary artery without angina pectoris: Secondary | ICD-10-CM

## 2016-10-19 DIAGNOSIS — Z01818 Encounter for other preprocedural examination: Secondary | ICD-10-CM

## 2016-10-19 DIAGNOSIS — M9900 Segmental and somatic dysfunction of head region: Secondary | ICD-10-CM | POA: Diagnosis not present

## 2016-10-19 DIAGNOSIS — M9901 Segmental and somatic dysfunction of cervical region: Secondary | ICD-10-CM | POA: Diagnosis not present

## 2016-10-19 DIAGNOSIS — M546 Pain in thoracic spine: Secondary | ICD-10-CM | POA: Diagnosis not present

## 2016-10-19 DIAGNOSIS — M9902 Segmental and somatic dysfunction of thoracic region: Secondary | ICD-10-CM | POA: Diagnosis not present

## 2016-10-19 DIAGNOSIS — M62830 Muscle spasm of back: Secondary | ICD-10-CM | POA: Diagnosis not present

## 2016-10-19 MED ORDER — NITROGLYCERIN 0.4 MG SL SUBL: 0.4000 mg | SUBLINGUAL_TABLET | SUBLINGUAL | 6 refills | Status: DC | PRN

## 2016-10-19 NOTE — Patient Instructions (Signed)
Medication Instructions:  Your physician recommends that you continue on your current medications as directed. Please refer to the Current Medication list given to you today.  Labwork: NONE  Testing/Procedures: Your physician has requested that you have a lexiscan myoview. For further information please visit [email or web address]. Please follow instruction sheet, as given.  Follow-Up: Your physician wants you to follow-up in: 3 months with Dr. Johnsie Cancel.   If you need a refill on your cardiac medications before your next appointment, please call your pharmacy.

## 2016-10-19 NOTE — Progress Notes (Signed)
Preop on 10/20/16 at 200pm.  Needs orders in EPIC Thank You

## 2016-10-19 NOTE — Progress Notes (Signed)
EKG 05-03-16 EPIC LOV DR Johnsie Cancel CARDIO 10-19-16 EPIC

## 2016-10-19 NOTE — Patient Instructions (Addendum)
Sean Lane  10/19/2016   Your procedure is scheduled on: 11-01-16  Report to [facility] Main  Entrance take [facility]  elevators to 3rd floor to  [place] at 1100 AM.  Call this number if you have problems the morning of surgery [phone]   Remember: ONLY 1 PERSON MAY GO WITH YOU TO SHORT STAY TO GET  READY MORNING OF [place].  Do not eat food AFTER MIDNIGHT Tuesday NIGHT CLEAR LIQUIDS FROM MIDNIGHT Tuesday NIGHT UNTIL 800 AM DAY OF SURGERY 11-01-16, NOTHING BY MOUTH AFTER 800 AM DAY OF SURGERY 11-01-16   Take these medicines the morning of surgery with A SIP OF WATER: DUTASTERIDE (AVODART), EYE DROP IF NEEDED, AFRIN NASAL SPRAY IF NEEDED                               You may not have any metal on your body including hair pins and              piercings  Do not wear jewelry, make-up, lotions, powders or perfumes, deodorant             Do not wear nail polish.  Do not shave  48 hours prior to surgery.              Men may shave face and neck.   Do not bring valuables to the hospital. [place].  Contacts, dentures or bridgework may not be worn into surgery.  Leave suitcase in the car. After surgery it may be brought to your room.                 Please read over the following fact sheets you were given: _____________________________________________________________________  [facility] - Preparing for Surgery Before surgery, you can play an important role.  Because skin is not sterile, your skin needs to be as free of germs as possible.  You can reduce the number of germs on your skin by washing with CHG (chlorahexidine gluconate) soap before surgery.  CHG is an antiseptic cleaner which kills germs and bonds with the skin to continue killing germs even after washing. Please DO NOT use if you have an allergy to CHG or antibacterial soaps.  If your skin becomes reddened/irritated stop using the CHG and  inform your nurse when you arrive at Short Stay. Do not shave (including legs and underarms) for at least 48 hours prior to the first CHG shower.  You may shave your face/neck. Please follow these instructions carefully:  1.  Shower with CHG Soap the night before surgery and the  morning of Surgery.  2.  If you choose to wash your hair, wash your hair first as usual with your  normal  shampoo.  3.  After you shampoo, rinse your hair and body thoroughly to remove the  shampoo.                           4.  Use CHG as you would any other liquid soap.  You can apply chg directly  to the skin and wash  Gently with a scrungie or clean washcloth.  5.  Apply the CHG Soap to your body ONLY FROM THE NECK DOWN.   Do not use on face/ open                           Wound or open sores. Avoid contact with eyes, ears mouth and genitals (private parts).                       Wash face,  Genitals (private parts) with your normal soap.             6.  Wash thoroughly, paying special attention to the area where your surgery  will be performed.  7.  Thoroughly rinse your body with warm water from the neck down.  8.  DO NOT shower/wash with your normal soap after using and rinsing off  the CHG Soap.                9.  Pat yourself dry with a clean towel.            10.  Wear clean pajamas.            11.  Place clean sheets on your bed the night of your first shower and do not  sleep with pets. Day of Surgery : Do not apply any lotions/deodorants the morning of surgery.  Please wear clean clothes to the hospital/surgery center.  FAILURE TO FOLLOW THESE INSTRUCTIONS MAY RESULT IN THE CANCELLATION OF YOUR SURGERY PATIENT SIGNATURE_________________________________  NURSE SIGNATURE__________________________________  ________________________________________________________________________   Sean Lane  An incentive spirometer is a tool that can help keep your lungs clear and  active. This tool measures how well you are filling your lungs with each breath. Taking long deep breaths may help reverse or decrease the chance of developing breathing (pulmonary) problems (especially infection) following:  A long period of time when you are unable to move or be active. BEFORE THE PROCEDURE   If the spirometer includes an indicator to show your best effort, your nurse or respiratory therapist will set it to a desired goal.  If possible, sit up straight or lean slightly forward. Try not to slouch.  Hold the incentive spirometer in an upright position. INSTRUCTIONS FOR USE  1. Sit on the edge of your bed if possible, or sit up as far as you can in bed or on a chair. 2. Hold the incentive spirometer in an upright position. 3. Breathe out normally. 4. Place the mouthpiece in your mouth and seal your lips tightly around it. 5. Breathe in slowly and as deeply as possible, raising the piston or the ball toward the top of the column. 6. Hold your breath for 3-5 seconds or for as long as possible. Allow the piston or ball to fall to the bottom of the column. 7. Remove the mouthpiece from your mouth and breathe out normally. 8. Rest for a few seconds and repeat Steps 1 through 7 at least 10 times every 1-2 hours when you are awake. Take your time and take a few normal breaths between deep breaths. 9. The spirometer may include an indicator to show your best effort. Use the indicator as a goal to work toward during each repetition. 10. After each set of 10 deep breaths, practice coughing to be sure your lungs are clear. If you have an incision (the cut made at the time of surgery),  support your incision when coughing by placing a pillow or rolled up towels firmly against it. Once you are able to get out of bed, walk around indoors and cough well. You may stop using the incentive spirometer when instructed by your caregiver.  RISKS AND COMPLICATIONS  Take your time so you do not get  dizzy or light-headed.  If you are in pain, you may need to take or ask for pain medication before doing incentive spirometry. It is harder to take a deep breath if you are having pain. AFTER USE  Rest and breathe slowly and easily.  It can be helpful to keep track of a log of your progress. Your caregiver can provide you with a simple table to help with this. If you are using the spirometer at home, follow these instructions: [place] IF:   You are having difficultly using the spirometer.  You have trouble using the spirometer as often as instructed.  Your pain medication is not giving enough relief while using the spirometer.  You develop fever of 100.5 F (38.1 C) or higher. SEEK IMMEDIATE MEDICAL CARE IF:   You cough up bloody sputum that had not been present before.  You develop fever of 102 F (38.9 C) or greater.  You develop worsening pain at or near the incision site. MAKE SURE YOU:   Understand these instructions.  Will watch your condition.  Will get help right away if you are not doing well or get worse. Document Released: 01/08/2007 Document Revised: 11/20/2011 Document Reviewed: 03/11/2007 ExitCare Patient Information 2014 ExitCare, [place].   ________________________________________________________________________  WHAT IS A BLOOD TRANSFUSION? Blood Transfusion Information  A transfusion is the replacement of blood or some of its parts. Blood is made up of multiple cells which provide different functions.  Red blood cells carry oxygen and are used for blood loss replacement.  White blood cells fight against infection.  Platelets control bleeding.  Plasma helps clot blood.  Other blood products are available for specialized needs, such as hemophilia or other clotting disorders. BEFORE THE TRANSFUSION  Who gives blood for transfusions?   Healthy volunteers who are fully evaluated to make sure their blood is safe. This is blood bank  blood. Transfusion therapy is the safest it has ever been in the practice of medicine. Before blood is taken from a donor, a complete history is taken to make sure that person has no history of diseases nor engages in risky social behavior (examples are intravenous drug use or sexual activity with multiple partners). The donor's travel history is screened to minimize risk of transmitting infections, such as malaria. The donated blood is tested for signs of infectious diseases, such as HIV and hepatitis. The blood is then tested to be sure it is compatible with you in order to minimize the chance of a transfusion reaction. If you or a relative donates blood, this is often done in anticipation of surgery and is not appropriate for emergency situations. It takes many days to process the donated blood. RISKS AND COMPLICATIONS Although transfusion therapy is very safe and saves many lives, the main dangers of transfusion include:   Getting an infectious disease.  Developing a transfusion reaction. This is an allergic reaction to something in the blood you were given. Every precaution is taken to prevent this. The decision to have a blood transfusion has been considered carefully by your caregiver before blood is given. Blood is not given unless the benefits outweigh the risks. AFTER THE TRANSFUSION  Right after receiving a blood transfusion, you will usually feel much better and more energetic. This is especially true if your red blood cells have gotten low (anemic). The transfusion raises the level of the red blood cells which carry oxygen, and this usually causes an energy increase.  The nurse administering the transfusion will monitor you carefully for complications. HOME CARE INSTRUCTIONS  No special instructions are needed after a transfusion. You may find your energy is better. Speak with your caregiver about any limitations on activity for underlying diseases you may have. SEEK MEDICAL CARE IF:    Your condition is not improving after your transfusion.  You develop redness or irritation at the intravenous (IV) site. SEEK IMMEDIATE MEDICAL CARE IF:  Any of the following symptoms occur over the next 12 hours:  Shaking chills.  You have a temperature by mouth above 102 F (38.9 C), not controlled by medicine.  Chest, back, or muscle pain.  People around you feel you are not acting correctly or are confused.  Shortness of breath or difficulty breathing.  Dizziness and fainting.  You get a rash or develop hives.  You have a decrease in urine output.  Your urine turns a dark color or changes to pink, red, or brown. Any of the following symptoms occur over the next 10 days:  You have a temperature by mouth above 102 F (38.9 C), not controlled by medicine.  Shortness of breath.  Weakness after normal activity.  The white part of the eye turns yellow (jaundice).  You have a decrease in the amount of urine or are urinating less often.  Your urine turns a dark color or changes to pink, red, or brown. Document Released: 08/25/2000 Document Revised: 11/20/2011 Document Reviewed: 04/13/2008 [facility] Patient Information 2014 [place], [place].  _______________________________________________________________________

## 2016-10-20 ENCOUNTER — Encounter (HOSPITAL_COMMUNITY): Payer: Self-pay

## 2016-10-20 ENCOUNTER — Telehealth (HOSPITAL_COMMUNITY): Payer: Self-pay | Admitting: *Deleted

## 2016-10-20 ENCOUNTER — Other Ambulatory Visit: Payer: Self-pay

## 2016-10-20 DIAGNOSIS — I495 Sick sinus syndrome: Secondary | ICD-10-CM | POA: Diagnosis not present

## 2016-10-20 DIAGNOSIS — I2510 Atherosclerotic heart disease of native coronary artery without angina pectoris: Secondary | ICD-10-CM | POA: Diagnosis not present

## 2016-10-20 DIAGNOSIS — Z7901 Long term (current) use of anticoagulants: Secondary | ICD-10-CM | POA: Diagnosis not present

## 2016-10-20 DIAGNOSIS — N400 Enlarged prostate without lower urinary tract symptoms: Secondary | ICD-10-CM | POA: Diagnosis not present

## 2016-10-20 DIAGNOSIS — R9439 Abnormal result of other cardiovascular function study: Secondary | ICD-10-CM | POA: Diagnosis present

## 2016-10-20 DIAGNOSIS — Z882 Allergy status to sulfonamides status: Secondary | ICD-10-CM | POA: Diagnosis not present

## 2016-10-20 DIAGNOSIS — I2584 Coronary atherosclerosis due to calcified coronary lesion: Secondary | ICD-10-CM | POA: Diagnosis not present

## 2016-10-20 DIAGNOSIS — I340 Nonrheumatic mitral (valve) insufficiency: Secondary | ICD-10-CM | POA: Diagnosis not present

## 2016-10-20 DIAGNOSIS — I4510 Unspecified right bundle-branch block: Secondary | ICD-10-CM | POA: Diagnosis not present

## 2016-10-20 DIAGNOSIS — I10 Essential (primary) hypertension: Secondary | ICD-10-CM | POA: Diagnosis not present

## 2016-10-20 DIAGNOSIS — Z7982 Long term (current) use of aspirin: Secondary | ICD-10-CM | POA: Diagnosis not present

## 2016-10-20 DIAGNOSIS — K219 Gastro-esophageal reflux disease without esophagitis: Secondary | ICD-10-CM | POA: Diagnosis not present

## 2016-10-20 DIAGNOSIS — I480 Paroxysmal atrial fibrillation: Secondary | ICD-10-CM | POA: Diagnosis not present

## 2016-10-20 DIAGNOSIS — Z955 Presence of coronary angioplasty implant and graft: Secondary | ICD-10-CM | POA: Diagnosis not present

## 2016-10-20 DIAGNOSIS — E785 Hyperlipidemia, unspecified: Secondary | ICD-10-CM | POA: Diagnosis not present

## 2016-10-20 LAB — CBC
HEMATOCRIT: 43.2 % (ref 39.0–52.0)
HEMOGLOBIN: 15.3 g/dL (ref 13.0–17.0)
MCH: 32.2 pg (ref 26.0–34.0)
MCHC: 35.4 g/dL (ref 30.0–36.0)
MCV: 90.9 fL (ref 78.0–100.0)
Platelets: 146 10*3/uL — ABNORMAL LOW (ref 150–400)
RBC: 4.75 MIL/uL (ref 4.22–5.81)
RDW: 13.5 % (ref 11.5–15.5)
WBC: 5.3 10*3/uL (ref 4.0–10.5)

## 2016-10-20 LAB — COMPREHENSIVE METABOLIC PANEL
ALBUMIN: 4.6 g/dL (ref 3.5–5.0)
ALK PHOS: 72 U/L (ref 38–126)
ALT: 15 U/L — ABNORMAL LOW (ref 17–63)
ANION GAP: 6 (ref 5–15)
AST: 18 U/L (ref 15–41)
BILIRUBIN TOTAL: 1 mg/dL (ref 0.3–1.2)
BUN: 19 mg/dL (ref 6–20)
CALCIUM: 9.5 mg/dL (ref 8.9–10.3)
CO2: 28 mmol/L (ref 22–32)
Chloride: 108 mmol/L (ref 101–111)
Creatinine, Ser: 0.98 mg/dL (ref 0.61–1.24)
GFR calc Af Amer: >60 mL/min (ref >60)
GFR calc non Af Amer: >60 mL/min (ref >60)
GLUCOSE: 123 mg/dL — AB (ref 65–99)
Potassium: 4.3 mmol/L (ref 3.5–5.1)
Sodium: 142 mmol/L (ref 135–145)
TOTAL PROTEIN: 7.2 g/dL (ref 6.5–8.1)

## 2016-10-20 LAB — PROTIME-INR
INR: 1.12
Prothrombin Time: 14.5 seconds (ref 11.4–15.2)

## 2016-10-20 LAB — GLUCOSE, CAPILLARY: Glucose-Capillary: 142 mg/dL — ABNORMAL HIGH (ref 65–99)

## 2016-10-20 LAB — SURGICAL PCR SCREEN
MRSA, PCR: NEGATIVE
Staphylococcus aureus: NEGATIVE

## 2016-10-20 LAB — APTT: APTT: 40 s — AB (ref 24–36)

## 2016-10-20 MED ORDER — GLUCOSE BLOOD VI STRP: ORAL_STRIP | 4 refills | Status: DC

## 2016-10-20 NOTE — Telephone Encounter (Signed)
Patient given detailed instructions per Myocardial Perfusion Study Information Sheet for the test on 10/23/16 at 7:45. Patient notified to arrive 15 minutes early and that it is imperative to arrive on time for appointment to keep from having the test rescheduled.  If you need to cancel or reschedule your appointment, please call the office within 24 hours of your appointment. Failure to do so may result in a cancellation of your appointment, and a $50 no show fee. Patient verbalized understanding.Sean Lane

## 2016-10-21 LAB — HEMOGLOBIN A1C
Hgb A1c MFr Bld: 5.5 % (ref 4.8–5.6)
Mean Plasma Glucose: 111 mg/dL

## 2016-10-23 ENCOUNTER — Encounter (HOSPITAL_COMMUNITY)
Admission: RE | Admit: 2016-10-23 | Discharge: 2016-10-23 | Disposition: A | Discharge status: Home / Self Care | Payer: Medicare Other | Source: Ambulatory Visit | Attending: Orthopedic Surgery | Admitting: Orthopedic Surgery

## 2016-10-23 ENCOUNTER — Ambulatory Visit (HOSPITAL_BASED_OUTPATIENT_CLINIC_OR_DEPARTMENT_OTHER): Payer: Medicare Other

## 2016-10-23 DIAGNOSIS — I495 Sick sinus syndrome: Secondary | ICD-10-CM | POA: Diagnosis not present

## 2016-10-23 DIAGNOSIS — I2584 Coronary atherosclerosis due to calcified coronary lesion: Secondary | ICD-10-CM | POA: Diagnosis not present

## 2016-10-23 DIAGNOSIS — Z01818 Encounter for other preprocedural examination: Secondary | ICD-10-CM

## 2016-10-23 DIAGNOSIS — Z955 Presence of coronary angioplasty implant and graft: Secondary | ICD-10-CM | POA: Diagnosis not present

## 2016-10-23 DIAGNOSIS — I2510 Atherosclerotic heart disease of native coronary artery without angina pectoris: Secondary | ICD-10-CM | POA: Diagnosis not present

## 2016-10-23 DIAGNOSIS — I340 Nonrheumatic mitral (valve) insufficiency: Secondary | ICD-10-CM | POA: Diagnosis not present

## 2016-10-23 DIAGNOSIS — I480 Paroxysmal atrial fibrillation: Secondary | ICD-10-CM | POA: Diagnosis not present

## 2016-10-23 HISTORY — DX: Personal history of urinary calculi: Z87.442

## 2016-10-23 HISTORY — DX: Changes in skin texture: R23.4

## 2016-10-23 LAB — MYOCARDIAL PERFUSION IMAGING
CHL CUP NUCLEAR SRS: 5
CHL CUP NUCLEAR SSS: 6
LHR: 0.21
LV dias vol: 74 mL (ref 62–150)
LV sys vol: 29 mL
NUC STRESS TID: 1.03
Peak HR: 82 {beats}/min
Rest HR: 62 {beats}/min
SDS: 3

## 2016-10-23 IMAGING — NM NM MISC PROCEDURE
9 series · 54 of 54 positions shown · non-contrast
Comparison: none

[Series 1: stress-sum-em · 6.40mm/px · 6 of 64 frames shown]
[frame 6/64]
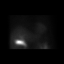
[frame 16/64]
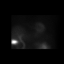
[frame 27/64]
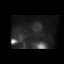
[frame 38/64]
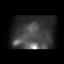
[frame 48/64]
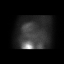
[frame 59/64]
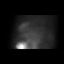

[Series 1: wbr_s-proj_st stress-gsp · 6.40mm/px · 6 of 512 frames shown]
[frame 43/512]
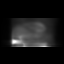
[frame 128/512]
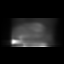
[frame 214/512]
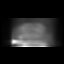
[frame 299/512]
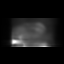
[frame 384/512]
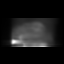
[frame 470/512]
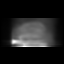

[Series 1: wbr_r-proj_st rest · 6.40mm/px · 6 of 64 frames shown]
[frame 6/64]
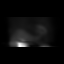
[frame 16/64]
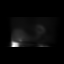
[frame 27/64]
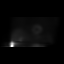
[frame 38/64]
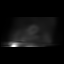
[frame 48/64]
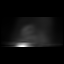
[frame 59/64]
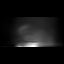

[Series 1: wbr_s-proj_st stress-sum-em · 6.40mm/px · 6 of 64 frames shown]
[frame 6/64]
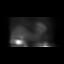
[frame 16/64]
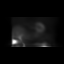
[frame 27/64]
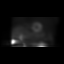
[frame 38/64]
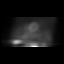
[frame 48/64]
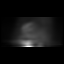
[frame 59/64]
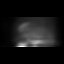

[Series 1: stress-gsp · 6.40mm/px · 6 of 512 frames shown]
[frame 43/512]
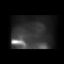
[frame 128/512]
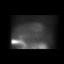
[frame 214/512]
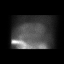
[frame 299/512]
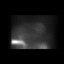
[frame 384/512]
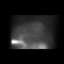
[frame 470/512]
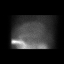

[Series 1: stress-gsp_(id)_sa · 6.4mm · 6.40mm/px · 6 of 512 frames shown]
[frame 43/512]
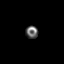
[frame 128/512]
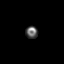
[frame 214/512]
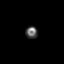
[frame 299/512]
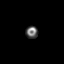
[frame 384/512]
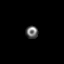
[frame 470/512]
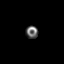

[Series 1: rest_(id)_sa · 6.4mm · 6.40mm/px · 6 of 64 frames shown]
[frame 6/64]
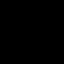
[frame 16/64]
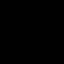
[frame 27/64]
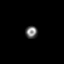
[frame 38/64]
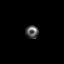
[frame 48/64]
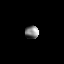
[frame 59/64]
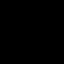

[Series 1: stress-sum-em_(id)_sa · 6.4mm · 6.40mm/px · 6 of 64 frames shown]
[frame 6/64]
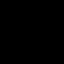
[frame 16/64]
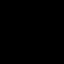
[frame 27/64]
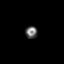
[frame 38/64]
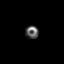
[frame 48/64]
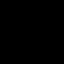
[frame 59/64]
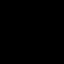

[Series 1: rest · 6.40mm/px · 6 of 64 frames shown]
[frame 6/64]
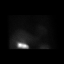
[frame 16/64]
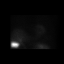
[frame 27/64]
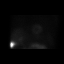
[frame 38/64]
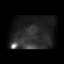
[frame 48/64]
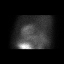
[frame 59/64]
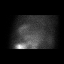

[54 of 54 positions shown; findings below may reference images not displayed]

Canned report from images found in remote index.

Refer to host system for actual result text.

## 2016-10-23 MED ORDER — REGADENOSON 0.4 MG/5ML IV SOLN
0.4000 mg | Freq: Once | INTRAVENOUS | Status: AC
  Administered 2016-10-23: 0.4 mg via INTRAVENOUS

## 2016-10-23 MED ORDER — TECHNETIUM TC 99M TETROFOSMIN IV KIT
10.1000 | PACK | Freq: Once | INTRAVENOUS | Status: AC | PRN
  Administered 2016-10-23: 10.1 via INTRAVENOUS
  Filled 2016-10-23: qty 11

## 2016-10-23 MED ORDER — TECHNETIUM TC 99M TETROFOSMIN IV KIT
32.8000 | PACK | Freq: Once | INTRAVENOUS | Status: AC | PRN
  Administered 2016-10-23: 32.8 via INTRAVENOUS
  Filled 2016-10-23: qty 33

## 2016-10-24 ENCOUNTER — Other Ambulatory Visit: Payer: Self-pay | Admitting: Physician Assistant

## 2016-10-24 ENCOUNTER — Encounter: Payer: Self-pay | Admitting: Family Medicine

## 2016-10-24 ENCOUNTER — Other Ambulatory Visit: Payer: Medicare Other | Admitting: *Deleted

## 2016-10-24 ENCOUNTER — Telehealth: Payer: Self-pay | Admitting: Physician Assistant

## 2016-10-24 DIAGNOSIS — R9439 Abnormal result of other cardiovascular function study: Secondary | ICD-10-CM

## 2016-10-24 NOTE — Telephone Encounter (Signed)
    Patient scheduled for heart cath on Thursday at [facility] with Dr. Ellyn Hack. Arranged pre cath labs and told him to hold Xarelto tonight and tomorrow.   Angelena Form PA-C  [facility]

## 2016-10-25 LAB — BASIC METABOLIC PANEL
BUN/Creatinine Ratio: 19 (ref 10–24)
BUN: 17 mg/dL (ref 8–27)
CALCIUM: 9.9 mg/dL (ref 8.6–10.2)
CHLORIDE: 102 mmol/L (ref 96–106)
CO2: 26 mmol/L (ref 18–29)
Creatinine, Ser: 0.89 mg/dL (ref 0.76–1.27)
GFR calc Af Amer: 89 mL/min/{1.73_m2} (ref >59)
GFR calc non Af Amer: 77 mL/min/{1.73_m2} (ref >59)
Glucose: 98 mg/dL (ref 65–99)
Potassium: 4.3 mmol/L (ref 3.5–5.2)
Sodium: 145 mmol/L — ABNORMAL HIGH (ref 134–144)

## 2016-10-25 LAB — CBC
HEMATOCRIT: 45.3 % (ref 37.5–51.0)
Hemoglobin: 15.5 g/dL (ref 13.0–17.7)
MCH: 32.1 pg (ref 26.6–33.0)
MCHC: 34.2 g/dL (ref 31.5–35.7)
MCV: 94 fL (ref 79–97)
Platelets: 164 10*3/uL (ref 150–379)
RBC: 4.83 x10E6/uL (ref 4.14–5.80)
RDW: 13.5 % (ref 12.3–15.4)
WBC: 5.8 10*3/uL (ref 3.4–10.8)

## 2016-10-25 LAB — PROTIME-INR
INR: 1.1 (ref 0.8–1.2)
PROTHROMBIN TIME: 11.5 s (ref 9.1–12.0)

## 2016-10-26 ENCOUNTER — Encounter (HOSPITAL_COMMUNITY)
Admission: RE | Disposition: A | Discharge status: Home / Self Care | Payer: Self-pay | Source: Ambulatory Visit | Attending: Cardiology

## 2016-10-26 ENCOUNTER — Encounter (HOSPITAL_COMMUNITY): Payer: Self-pay | Admitting: Cardiology

## 2016-10-26 ENCOUNTER — Ambulatory Visit (HOSPITAL_COMMUNITY)
Admission: RE | Admit: 2016-10-26 | Discharge: 2016-10-26 | Disposition: A | Discharge status: Home / Self Care | Payer: Medicare Other | Source: Ambulatory Visit | Attending: Cardiology | Admitting: Cardiology

## 2016-10-26 ENCOUNTER — Telehealth: Payer: Self-pay | Admitting: *Deleted

## 2016-10-26 ENCOUNTER — Other Ambulatory Visit: Payer: Self-pay

## 2016-10-26 ENCOUNTER — Encounter: Payer: Self-pay | Admitting: Family Medicine

## 2016-10-26 DIAGNOSIS — I480 Paroxysmal atrial fibrillation: Secondary | ICD-10-CM | POA: Insufficient documentation

## 2016-10-26 DIAGNOSIS — I495 Sick sinus syndrome: Secondary | ICD-10-CM | POA: Insufficient documentation

## 2016-10-26 DIAGNOSIS — Z955 Presence of coronary angioplasty implant and graft: Secondary | ICD-10-CM | POA: Insufficient documentation

## 2016-10-26 DIAGNOSIS — I2584 Coronary atherosclerosis due to calcified coronary lesion: Secondary | ICD-10-CM | POA: Diagnosis not present

## 2016-10-26 DIAGNOSIS — R9439 Abnormal result of other cardiovascular function study: Secondary | ICD-10-CM | POA: Diagnosis present

## 2016-10-26 DIAGNOSIS — I10 Essential (primary) hypertension: Secondary | ICD-10-CM | POA: Diagnosis not present

## 2016-10-26 DIAGNOSIS — N400 Enlarged prostate without lower urinary tract symptoms: Secondary | ICD-10-CM | POA: Insufficient documentation

## 2016-10-26 DIAGNOSIS — I340 Nonrheumatic mitral (valve) insufficiency: Secondary | ICD-10-CM | POA: Diagnosis not present

## 2016-10-26 DIAGNOSIS — K219 Gastro-esophageal reflux disease without esophagitis: Secondary | ICD-10-CM | POA: Diagnosis not present

## 2016-10-26 DIAGNOSIS — Z01810 Encounter for preprocedural cardiovascular examination: Secondary | ICD-10-CM

## 2016-10-26 DIAGNOSIS — I4510 Unspecified right bundle-branch block: Secondary | ICD-10-CM | POA: Diagnosis not present

## 2016-10-26 DIAGNOSIS — Z7982 Long term (current) use of aspirin: Secondary | ICD-10-CM | POA: Diagnosis not present

## 2016-10-26 DIAGNOSIS — E785 Hyperlipidemia, unspecified: Secondary | ICD-10-CM | POA: Diagnosis not present

## 2016-10-26 DIAGNOSIS — Z9861 Coronary angioplasty status: Secondary | ICD-10-CM

## 2016-10-26 DIAGNOSIS — Z882 Allergy status to sulfonamides status: Secondary | ICD-10-CM | POA: Insufficient documentation

## 2016-10-26 DIAGNOSIS — I2510 Atherosclerotic heart disease of native coronary artery without angina pectoris: Secondary | ICD-10-CM | POA: Insufficient documentation

## 2016-10-26 DIAGNOSIS — Z7901 Long term (current) use of anticoagulants: Secondary | ICD-10-CM | POA: Insufficient documentation

## 2016-10-26 HISTORY — PX: LEFT HEART CATH AND CORONARY ANGIOGRAPHY: CATH118249

## 2016-10-26 LAB — GLUCOSE, CAPILLARY: Glucose-Capillary: 111 mg/dL — ABNORMAL HIGH (ref 65–99)

## 2016-10-26 SURGERY — LEFT HEART CATH AND CORONARY ANGIOGRAPHY
Anesthesia: LOCAL

## 2016-10-26 MED ORDER — ONDANSETRON HCL 4 MG/2ML IJ SOLN: 4.0000 mg | Freq: Four times a day (QID) | INTRAMUSCULAR | Status: DC | PRN

## 2016-10-26 MED ORDER — SODIUM CHLORIDE 0.9 % WEIGHT BASED INFUSION: 1.0000 mL/kg/h | INTRAVENOUS | Status: DC

## 2016-10-26 MED ORDER — HEPARIN SODIUM (PORCINE) 1000 UNIT/ML IJ SOLN
INTRAMUSCULAR | Status: AC
  Filled 2016-10-26: qty 1

## 2016-10-26 MED ORDER — LIDOCAINE HCL (PF) 1 % IJ SOLN
INTRAMUSCULAR | Status: AC
  Filled 2016-10-26: qty 30

## 2016-10-26 MED ORDER — NITROGLYCERIN 1 MG/10 ML FOR IR/CATH LAB
INTRA_ARTERIAL | Status: AC
  Filled 2016-10-26: qty 10

## 2016-10-26 MED ORDER — SODIUM CHLORIDE 0.9% FLUSH: 3.0000 mL | INTRAVENOUS | Status: DC | PRN

## 2016-10-26 MED ORDER — SODIUM CHLORIDE 0.9 % WEIGHT BASED INFUSION: 3.0000 mL/kg/h | INTRAVENOUS | Status: DC

## 2016-10-26 MED ORDER — MIDAZOLAM HCL 2 MG/2ML IJ SOLN
INTRAMUSCULAR | Status: DC | PRN
  Administered 2016-10-26 (×2): 1 mg via INTRAVENOUS

## 2016-10-26 MED ORDER — SODIUM CHLORIDE 0.9% FLUSH: 3.0000 mL | Freq: Two times a day (BID) | INTRAVENOUS | Status: DC

## 2016-10-26 MED ORDER — IOPAMIDOL (ISOVUE-370) INJECTION 76%
INTRAVENOUS | Status: AC
  Filled 2016-10-26: qty 100

## 2016-10-26 MED ORDER — HEPARIN (PORCINE) IN NACL 2-0.9 UNIT/ML-% IJ SOLN
INTRAMUSCULAR | Status: AC
  Filled 2016-10-26: qty 1000

## 2016-10-26 MED ORDER — SODIUM CHLORIDE 0.9 % WEIGHT BASED INFUSION
3.0000 mL/kg/h | INTRAVENOUS | Status: AC
  Administered 2016-10-26: 3 mL/kg/h via INTRAVENOUS

## 2016-10-26 MED ORDER — HEPARIN (PORCINE) IN NACL 2-0.9 UNIT/ML-% IJ SOLN
INTRAMUSCULAR | Status: DC | PRN
  Administered 2016-10-26: 1000 mL

## 2016-10-26 MED ORDER — ASPIRIN 81 MG PO CHEW: 81.0000 mg | CHEWABLE_TABLET | ORAL | Status: DC

## 2016-10-26 MED ORDER — VERAPAMIL HCL 2.5 MG/ML IV SOLN
INTRAVENOUS | Status: AC
  Filled 2016-10-26: qty 2

## 2016-10-26 MED ORDER — IOPAMIDOL (ISOVUE-370) INJECTION 76%
INTRAVENOUS | Status: DC | PRN
  Administered 2016-10-26: 110 mL via INTRA_ARTERIAL

## 2016-10-26 MED ORDER — SODIUM CHLORIDE 0.9 % IV SOLN: 250.0000 mL | INTRAVENOUS | Status: DC | PRN

## 2016-10-26 MED ORDER — NITROGLYCERIN 1 MG/10 ML FOR IR/CATH LAB
INTRA_ARTERIAL | Status: DC | PRN
  Administered 2016-10-26: 200 ug via INTRACORONARY

## 2016-10-26 MED ORDER — VERAPAMIL HCL 2.5 MG/ML IV SOLN
INTRAVENOUS | Status: DC | PRN
  Administered 2016-10-26: 10 mL via INTRA_ARTERIAL

## 2016-10-26 MED ORDER — FENTANYL CITRATE (PF) 100 MCG/2ML IJ SOLN
INTRAMUSCULAR | Status: AC
Start: 2016-10-26 — End: ?
  Filled 2016-10-26: qty 2

## 2016-10-26 MED ORDER — ACETAMINOPHEN 325 MG PO TABS: 650.0000 mg | ORAL_TABLET | ORAL | Status: DC | PRN

## 2016-10-26 MED ORDER — LIDOCAINE HCL (PF) 1 % IJ SOLN
INTRAMUSCULAR | Status: DC | PRN
  Administered 2016-10-26: 2 mL via SUBCUTANEOUS

## 2016-10-26 MED ORDER — SODIUM CHLORIDE 0.9 % IV SOLN: INTRAVENOUS | Status: DC

## 2016-10-26 MED ORDER — FENTANYL CITRATE (PF) 100 MCG/2ML IJ SOLN
INTRAMUSCULAR | Status: DC | PRN
  Administered 2016-10-26: 25 ug via INTRAVENOUS

## 2016-10-26 MED ORDER — HEPARIN SODIUM (PORCINE) 1000 UNIT/ML IJ SOLN
INTRAMUSCULAR | Status: DC | PRN
  Administered 2016-10-26: 4500 [IU] via INTRAVENOUS

## 2016-10-26 MED ORDER — MIDAZOLAM HCL 2 MG/2ML IJ SOLN
INTRAMUSCULAR | Status: AC
  Filled 2016-10-26: qty 2

## 2016-10-26 SURGICAL SUPPLY — 12 items
CATH INFINITI 5 FR JL3.5 (CATHETERS) ×1 IMPLANT
CATH INFINITI 5FR ANG PIGTAIL (CATHETERS) ×1 IMPLANT
CATH INFINITI JR4 5F (CATHETERS) ×1 IMPLANT
CATH OPTITORQUE TIG 4.0 5F (CATHETERS) ×1 IMPLANT
DEVICE RAD COMP TR BAND LRG (VASCULAR PRODUCTS) ×1 IMPLANT
GLIDESHEATH SLEND A-KIT 6F 22G (SHEATH) ×1 IMPLANT
GUIDEWIRE INQWIRE 1.5J.035X260 (WIRE) IMPLANT
INQWIRE 1.5J .035X260CM (WIRE) ×2
KIT HEART LEFT (KITS) ×2 IMPLANT
PACK CARDIAC CATHETERIZATION (CUSTOM PROCEDURE TRAY) ×2 IMPLANT
TRANSDUCER W/STOPCOCK (MISCELLANEOUS) ×2 IMPLANT
TUBING CIL FLEX 10 FLL-RA (TUBING) ×2 IMPLANT

## 2016-10-26 NOTE — Discharge Instructions (Signed)

## 2016-10-26 NOTE — Interval H&P Note (Signed)
History and Physical Interval Note:  10/26/2016 9:37 AM  Sean Lane  has presented today for surgery, with the diagnosis of positive stress test following evaluation by Dr. Johnsie Cancel.  The various methods of treatment have been discussed with the patient and family. After consideration of risks, benefits and other options for treatment, the patient has consented to  Procedure(s): Left Heart Cath and Coronary Angiography (N/A) With Possible Percutaneous Coronary Intervention as a surgical intervention .  The patient's history has been reviewed, patient examined, no change in status, stable for surgery.  I have reviewed the patient's chart and labs.  Questions were answered to the patient's satisfaction.    Cath Lab Visit (complete for each Cath Lab visit)  Clinical Evaluation Leading to the Procedure:   ACS: No.  Non-ACS:    Anginal Classification: No Symptoms  Anti-ischemic medical therapy: Minimal Therapy (1 class of medications): Pre-op for Knee surgery  Non-Invasive Test Results: Intermediate-risk stress test findings: cardiac mortality 1-3%/year  Prior CABG: No previous CABG   Glenetta Hew

## 2016-10-26 NOTE — Telephone Encounter (Signed)
Ok to hold asa/xarelto and have knee surgery

## 2016-10-26 NOTE — H&P (View-Only) (Signed)
Patient ID: Sean Lane, male   DOB: 1927-09-28, 83 y.o.   MRN: [id number]   Mr. Merenda returns today for followup. He is a very pleasant 83 y.o.  man with a history of atrial flutter, hypertension, and sinus bradycardia and CAD . The patient has maintaining sinus rhythm since last February. I had initially seen him in January 15  and he was in atrial flutter with a controlled ventricular response. He spontaneously reverted to sinus rhythm. He was placed on anticoagulation with Coumadin. He denies chest pain or shortness of breath  Changed to  Xarelto end of June 2017  having microhematuria Sees Grapey for prostate   He has a history of CAD, s/p PCI to CFX in 2009 with Cypher DES x2, hypertension, hyperlipidemia, right bundle branch block, GERD, BPH. LHC 3/09: Mid RCA 50%, left main 20%, proximal LAD 40-50%, mid LAD 40%, proximal circumflex 80% and an 80% after OM2, left PDA 40%, EF 60%. PCI: Cypher DES x2 to the proximal and mid circumflex   Last exercise treadmill test 1/12: Heart rate increased appropriately, no ischemic EKG changes.   Discussed future need of pacer. Avoid all AV nodal blocking drugs. He exercises regularly and has good chronotropic response  Last echo 2015 normal EF  Study Conclusions  - Left ventricle: The cavity size was normal. Systolic function was normal. The estimated ejection fraction was in the range of 55% to 60%. Wall motion was normal; there were no regional wall motion abnormalities. - Mitral valve: Mild regurgitation. - Left atrium: The atrium was mildly dilated. - Atrial septum: No defect or patent foramen ovale was identified.  Right knee acutely worse 2 months ago Will have right knee surgery 2/21 with Alusio Discussed merits of doing myovue before   ROS: Denies fever, malais, weight loss, blurry vision, decreased visual acuity, cough, sputum, SOB, hemoptysis, pleuritic pain, palpitaitons, heartburn, abdominal pain, melena, lower  extremity edema, claudication, or rash.  All other systems reviewed and negative  General: Affect appropriate not postural systoolic BP [id number] sitting 0000000 standing  Healthy:  appears stated age HEENT: normal Neck supple with no adenopathy JVP normal no bruits no thyromegaly Lungs clear with no wheezing and good diaphragmatic motion Heart:  S1/S2 no murmur, no rub, gallop or click PMI normal Abdomen: benighn, BS positve, no tenderness, no AAA no bruit.  No HSM or HJR Distal pulses intact with no bruits No edema Neuro non-focal Skin warm and dry No muscular weakness   Current Outpatient Prescriptions  Medication Sig Dispense Refill  . acetaminophen (TYLENOL) 500 MG tablet Take 1,000 mg by mouth every 6 (six) hours as needed for moderate pain.    Marland Kitchen acetaminophen (TYLENOL) 650 MG CR tablet Take 1,300 mg by mouth every 8 (eight) hours as needed for pain.    Marland Kitchen amoxicillin (AMOXIL) 500 MG capsule TAKE FOUR CAPSULES BY MOUTH ONE HOUR BEFORE APPOINTMENT 8 capsule 3  . aspirin EC 81 MG tablet Take 1 tablet (81 mg total) by mouth daily.    Marland Kitchen dutasteride (AVODART) 0.5 MG capsule TAKE 1 CAPSULE DAILY 90 capsule 1  . glucose blood (FREESTYLE INSULINX TEST) test strip USE TO CHECK BLOOD SUGAR DAILY AND AS NEEDED 100 each 4  . Lancets (FREESTYLE) lancets CHECK BLOOD SUGAR ONCE DAILY 100 each 4  . loratadine (CLARITIN) 10 MG tablet Take 10 mg by mouth daily as needed for allergies.     . Naphazoline-Pheniramine (OPCON-A OP) Apply 1 drop to eye daily as needed (allergies).    Marland Kitchen  Omega-3 Fatty Acids (FISH OIL) 1200 MG CAPS Take 2,400 mg by mouth 2 (two) times daily.    Marland Kitchen OVER THE COUNTER MEDICATION Place 1-2 drops into both eyes daily as needed (for red eyes). "[employer] brand for red eyes"    . oxymetazoline (AFRIN) 0.05 % nasal spray Place 1 spray into both nostrils 2 (two) times daily as needed for congestion.    . rivaroxaban (XARELTO) 20 MG TABS tablet TAKE 1 TABLET DAILY WITH   SUPPER (Patient taking  differently: Take 20 mg by mouth daily with supper. TAKE 1 TABLET DAILY WITH   SUPPER) 90 tablet 0  . rosuvastatin (CRESTOR) 10 MG tablet Take 1 tablet (10 mg total) by mouth daily. (Patient taking differently: Take 10 mg by mouth every evening. ) 90 tablet 3  . tamsulosin (FLOMAX) 0.4 MG CAPS capsule Take 1 capsule (0.4 mg total) by mouth daily. 90 capsule 1  . nitroGLYCERIN (NITROSTAT) 0.4 MG SL tablet Place 1 tablet (0.4 mg total) under the tongue every 5 (five) minutes as needed for chest pain (for chest pain). 25 tablet 6   No current facility-administered medications for this visit.     Allergies  Hydromorphone hcl and Sulfonamide derivatives  Electrocardiogram:  Sinus arrhythmia RBBB no high grade AV block 04/30/14  05/03/16  SR rate 69 RBBB   Assessment and Plan CAD: Poor functional status last 2 months with distant intervention to circumflex f/u  lexiscan myovue to clear for surgery   PAF: maint NSR continue xarelto Since there is a question of spinal anesthesia will need To hold DOAC 3 days before surgery   Anticoagulation: renal function good no bleeding issues continue xarelto  SSS: monitor with no pauses.  Baseline RBBB  ETT with adequate HR response  Observe  HTN Well controlled.  Continue current medications and low sodium Dash type diet.    Chol:  Lab Results  Component Value Date   LDLCALC 54 04/09/2014   Diverticulitis:  Resolved documented diverticulosis on previous colonoscopy  Discussed low fiber diet  Fatigue: no cardiac etiology TSH normal 06/06/16 A1c Hct other labs also ok f/u primary   Jenkins Rouge

## 2016-10-26 NOTE — Telephone Encounter (Signed)
PATIENT HAD CARDIAC CATH TODAY   DEFER TO DR [facility]    Request for surgical clearance:  1. What type of surgery is being performed? RIGHT: Right  Knee medial unicompartmental arthoplasty  2. When is this surgery scheduled? 10/13/2016  3. Are there any medications that need to be held prior to surgery and how long?aspirin and xarelto  4. Name of physician performing surgery? DR [name]  5. What is your office phone and fax number?  [place]  [phone]

## 2016-10-27 NOTE — Telephone Encounter (Signed)
Routed to Dr Aluisio's office

## 2016-10-31 ENCOUNTER — Ambulatory Visit: Payer: Self-pay | Admitting: Orthopedic Surgery

## 2016-10-31 NOTE — Progress Notes (Signed)
Cardiac cath 10-26-16 epic and clearance note on report  Cardiac clearanc enote dr Johnsie Cancel on chart Stress test 10-23-16

## 2016-10-31 NOTE — H&P (Signed)
Sean Lane DOB: 04-01-1928 Widowed / Language: English / Race: White Male Date of Admission:  11/01/2016 CC:  Right Knee Pain History of Present Illness The patient is a 83 year old male who comes in for a preoperative History and Physical. The patient is scheduled for a right unicompartmental knee arthroplasty to be performed by Dr. Dione Plover. Aluisio, MD at [facility] on 11-01-2016. The patient is a 83 year old male who presents today for follow up of their knee. The patient is being followed for their right knee pain and osteoarthritis. They are now month(s) out from cortisone injection. Symptoms reported include: pain and pain at night. The patient feels that they are doing poorly and report their pain level to be moderate to severe. Current treatment includes: icing. The following medication has been used for pain control: Tylenol. The patient has reported improvement of their symptoms with: Cortisone injections. He has isolated medial compartment arthritis with a normal lateral and patellofemoral compartment. He also wants a more permanent solution to this. He is an excellent candidate for unicompartmental replacement. They have been treated conservatively in the past for the above stated problem and despite conservative measures, they continue to have progressive pain and severe functional limitations and dysfunction. They have failed non-operative management including home exercise, medications, and injections. It is felt that they would benefit from undergoing unicompartmental joint replacement. Risks and benefits of the procedure have been discussed with the patient and they elect to proceed with surgery. There are no active contraindications to surgery such as ongoing infection or rapidly progressive neurological disease.  Problem List/Past Medical  Subacromial impingement of left shoulder (M75.42)  Chronic left shoulder pain (M25.512)  Primary osteoarthritis of right knee  (M17.11)  Cardiac Arrhythmia  Atrial Flutter Coronary artery disease  Diabetes Mellitus, Type II  Diverticulitis Of Colon  Gout  High blood pressure  Hypercholesterolemia  Kidney Stone  Migraine Headache  Prostate Disease  Ulcer disease  Allergies  Sulfa Antibiotics  Hives, Rash, Difficulty breathing. Dilaudid *ANALGESICS - OPIOID*  Confusion, Agitation, Sedation  Family History Diabetes Mellitus  grandfather mothers side Heart Disease  father and grandfather fathers side Rheumatoid Arthritis  mother  Social History Alcohol use  current drinker; only occasionally per week Children  1 Current work status  retired Engineer, agricultural (Currently)  no Drug/Alcohol Rehab (Previously)  no Exercise  Exercises daily; does gym / [employer] Illicit drug use  no Living situation  live with spouse Marital status  married Number of flights of stairs before winded  2-3 Pain Contract  no Tobacco / smoke exposure  no Tobacco use  former smoker; smoke(d) less than 1/2 pack(s) per day  Medication History Fish Oil Burp-Less (1000MG  Capsule, Oral) Active. Aspirin (81MG  Tablet Chewable, Oral) Active. Tamsulosin HCl (0.4MG  Capsule, Oral) Active. Crestor (10MG  Tablet, Oral) Active. Xarelto (20MG  Tablet, Oral) Active. Avodart (0.5MG  Capsule, Oral) Active. Multivitamin Adult (Oral) Active.  Past Surgical History  Arthroscopy of Knee  right Cataract Surgery  bilateral Colon Polyp Removal - Colonoscopy  Foot Surgery  right Heart Stents  Total Knee Replacement  left Vasectomy    Review of Systems General Not Present- Chills, Fatigue, Fever, Memory Loss, Night Sweats, Weight Gain and Weight Loss. Skin Not Present- Eczema, Hives, Itching, Lesions and Rash. HEENT Not Present- Dentures, Double Vision, Headache, Hearing Loss, Tinnitus and Visual Loss. Respiratory Not Present- Allergies, Chronic Cough, Coughing up blood, Shortness of breath at rest and  Shortness of breath with exertion. Cardiovascular Not  Present- Chest Pain, Difficulty Breathing Lying Down, Murmur, Palpitations, Racing/skipping heartbeats and Swelling. Gastrointestinal Not Present- Abdominal Pain, Bloody Stool, Constipation, Diarrhea, Difficulty Swallowing, Heartburn, Jaundice, Loss of appetitie, Nausea and Vomiting. Male Genitourinary Not Present- Blood in Urine, Discharge, Flank Pain, Incontinence, Painful Urination, Urgency, Urinary frequency, Urinary Retention, Urinating at Night and Weak urinary stream. Musculoskeletal Present- Joint Pain. Not Present- Back Pain, Joint Swelling, Morning Stiffness, Muscle Pain, Muscle Weakness and Spasms. Neurological Not Present- Blackout spells, Difficulty with balance, Dizziness, Paralysis, Tremor and Weakness. Psychiatric Not Present- Insomnia.  Vitals  Weight: 180 lb Height: 73.5in Weight was reported by patient. Height was reported by patient. Body Surface Area: 2.07 m Body Mass Index: 23.43 kg/m  Pulse: 56 (Regular)  BP: 140/66 (Sitting, Right Arm, Standard)  Physical Exam  General Mental Status -Alert, cooperative and good historian. General Appearance-pleasant, Not in acute distress. Orientation-Oriented X3. Build & Nutrition-Well nourished and Well developed.  Head and Neck Head-normocephalic, atraumatic . Neck Global Assessment - supple, no bruit auscultated on the right, no bruit auscultated on the left.  Eye Pupil - Bilateral-Regular and Round. Motion - Bilateral-EOMI.  Chest and Lung Exam Auscultation Breath sounds - clear at anterior chest wall and clear at posterior chest wall. Adventitious sounds - No Adventitious sounds.  Cardiovascular Auscultation Rhythm - Regular(with occasional skipped or ectopic beat). Heart Sounds - S1 WNL and S2 WNL. Murmurs & Other Heart Sounds - Auscultation of the heart reveals - No Murmurs.  Abdomen Palpation/Percussion Tenderness - Abdomen is  non-tender to palpation. Rigidity (guarding) - Abdomen is soft. Auscultation Auscultation of the abdomen reveals - Bowel sounds normal.  Male Genitourinary Note: Not done, not pertinent to present illness  Musculoskeletal Note: He is in no distress. His right knee shows no effusion. His range of motion is about 5 to 125. He has marked crepitus on range is motion. Tender medially. There is no lateral tenderness or instability.  RADIOGRAPHS Reviewed again, he had isolated medial compartment arthritis with a normal lateral and patellofemoral compartment.  Assessment & Plan  Primary osteoarthritis of right knee (M17.11)  Note:Surgical Plans: Right Unicompartmental Knee Replacement  Disposition: Home and straight to outpatient therapy at [facility] to start on Friday 11/03/2016.  PCP: Dr. [name] Cards: Dr. Jenkins Rouge - Pending at time of H&P  Topical TXA  Anesthesia Issues: None  Patient was instructed on what medications to stop prior to surgery.  Signed electronically by Ok Edwards, III PA-C

## 2016-11-01 ENCOUNTER — Inpatient Hospital Stay (HOSPITAL_COMMUNITY): Payer: Medicare Other | Admitting: Anesthesiology

## 2016-11-01 ENCOUNTER — Ambulatory Visit: Payer: Medicare Other | Admitting: Cardiovascular Disease

## 2016-11-01 ENCOUNTER — Encounter (HOSPITAL_COMMUNITY): Payer: Self-pay | Admitting: *Deleted

## 2016-11-01 ENCOUNTER — Encounter (HOSPITAL_COMMUNITY)
Admission: RE | Disposition: A | Discharge status: Home / Self Care | Payer: Self-pay | Source: Ambulatory Visit | Attending: Orthopedic Surgery

## 2016-11-01 ENCOUNTER — Observation Stay (HOSPITAL_COMMUNITY)
Admission: RE | Admit: 2016-11-01 | Discharge: 2016-11-02 | Disposition: A | Discharge status: Home / Self Care | Payer: Medicare Other | Source: Ambulatory Visit | Attending: Orthopedic Surgery | Admitting: Orthopedic Surgery

## 2016-11-01 DIAGNOSIS — Z8719 Personal history of other diseases of the digestive system: Secondary | ICD-10-CM | POA: Diagnosis not present

## 2016-11-01 DIAGNOSIS — Z87442 Personal history of urinary calculi: Secondary | ICD-10-CM | POA: Insufficient documentation

## 2016-11-01 DIAGNOSIS — M1711 Unilateral primary osteoarthritis, right knee: Secondary | ICD-10-CM | POA: Diagnosis not present

## 2016-11-01 DIAGNOSIS — E7800 Pure hypercholesterolemia, unspecified: Secondary | ICD-10-CM | POA: Insufficient documentation

## 2016-11-01 DIAGNOSIS — K219 Gastro-esophageal reflux disease without esophagitis: Secondary | ICD-10-CM | POA: Diagnosis not present

## 2016-11-01 DIAGNOSIS — Z882 Allergy status to sulfonamides status: Secondary | ICD-10-CM | POA: Insufficient documentation

## 2016-11-01 DIAGNOSIS — E119 Type 2 diabetes mellitus without complications: Secondary | ICD-10-CM | POA: Diagnosis not present

## 2016-11-01 DIAGNOSIS — Z87891 Personal history of nicotine dependence: Secondary | ICD-10-CM | POA: Insufficient documentation

## 2016-11-01 DIAGNOSIS — M179 Osteoarthritis of knee, unspecified: Secondary | ICD-10-CM | POA: Diagnosis present

## 2016-11-01 DIAGNOSIS — Z7984 Long term (current) use of oral hypoglycemic drugs: Secondary | ICD-10-CM | POA: Insufficient documentation

## 2016-11-01 DIAGNOSIS — Z7982 Long term (current) use of aspirin: Secondary | ICD-10-CM | POA: Diagnosis not present

## 2016-11-01 DIAGNOSIS — Z885 Allergy status to narcotic agent status: Secondary | ICD-10-CM | POA: Insufficient documentation

## 2016-11-01 DIAGNOSIS — I4892 Unspecified atrial flutter: Secondary | ICD-10-CM | POA: Insufficient documentation

## 2016-11-01 DIAGNOSIS — Z8711 Personal history of peptic ulcer disease: Secondary | ICD-10-CM | POA: Diagnosis not present

## 2016-11-01 DIAGNOSIS — I10 Essential (primary) hypertension: Secondary | ICD-10-CM | POA: Insufficient documentation

## 2016-11-01 DIAGNOSIS — M25761 Osteophyte, right knee: Secondary | ICD-10-CM | POA: Diagnosis not present

## 2016-11-01 DIAGNOSIS — I2510 Atherosclerotic heart disease of native coronary artery without angina pectoris: Secondary | ICD-10-CM | POA: Diagnosis not present

## 2016-11-01 DIAGNOSIS — M109 Gout, unspecified: Secondary | ICD-10-CM | POA: Insufficient documentation

## 2016-11-01 DIAGNOSIS — M1710 Unilateral primary osteoarthritis, unspecified knee: Secondary | ICD-10-CM | POA: Diagnosis present

## 2016-11-01 DIAGNOSIS — G8918 Other acute postprocedural pain: Secondary | ICD-10-CM | POA: Diagnosis not present

## 2016-11-01 DIAGNOSIS — E785 Hyperlipidemia, unspecified: Secondary | ICD-10-CM | POA: Diagnosis not present

## 2016-11-01 HISTORY — PX: PARTIAL KNEE ARTHROPLASTY: SHX2174

## 2016-11-01 LAB — GLUCOSE, CAPILLARY
GLUCOSE-CAPILLARY: 103 mg/dL — AB (ref 65–99)
GLUCOSE-CAPILLARY: 107 mg/dL — AB (ref 65–99)
Glucose-Capillary: 206 mg/dL — ABNORMAL HIGH (ref 65–99)
Glucose-Capillary: 173 mg/dL — ABNORMAL HIGH (ref 65–99)

## 2016-11-01 LAB — TYPE AND SCREEN
ABO/RH(D): O POS
Antibody Screen: NEGATIVE

## 2016-11-01 LAB — ABO/RH: ABO/RH(D): O POS

## 2016-11-01 SURGERY — ARTHROPLASTY, KNEE, UNICOMPARTMENTAL
Anesthesia: Spinal | Site: Knee | Laterality: Right

## 2016-11-01 MED ORDER — SODIUM CHLORIDE 0.9 % IV SOLN
2000.0000 mg | Freq: Once | INTRAVENOUS | Status: DC
  Filled 2016-11-01: qty 20

## 2016-11-01 MED ORDER — PHENOL 1.4 % MT LIQD: 1.0000 | OROMUCOSAL | Status: DC | PRN

## 2016-11-01 MED ORDER — ACETAMINOPHEN 325 MG PO TABS: 650.0000 mg | ORAL_TABLET | Freq: Four times a day (QID) | ORAL | Status: DC | PRN

## 2016-11-01 MED ORDER — CEFAZOLIN SODIUM-DEXTROSE 2-4 GM/100ML-% IV SOLN
2.0000 g | INTRAVENOUS | Status: AC
  Administered 2016-11-01: 2 g via INTRAVENOUS

## 2016-11-01 MED ORDER — DEXAMETHASONE SODIUM PHOSPHATE 10 MG/ML IJ SOLN
10.0000 mg | Freq: Once | INTRAMUSCULAR | Status: AC
  Administered 2016-11-01: 10 mg via INTRAVENOUS

## 2016-11-01 MED ORDER — CEFAZOLIN SODIUM-DEXTROSE 2-4 GM/100ML-% IV SOLN
INTRAVENOUS | Status: AC
  Filled 2016-11-01: qty 100

## 2016-11-01 MED ORDER — LORATADINE 10 MG PO TABS: 10.0000 mg | ORAL_TABLET | Freq: Every day | ORAL | Status: DC | PRN

## 2016-11-01 MED ORDER — ROSUVASTATIN CALCIUM 10 MG PO TABS
10.0000 mg | ORAL_TABLET | Freq: Every evening | ORAL | Status: DC
  Administered 2016-11-01: 18:00:00 10 mg via ORAL
  Filled 2016-11-01: qty 1

## 2016-11-01 MED ORDER — ACETAMINOPHEN 650 MG RE SUPP: 650.0000 mg | Freq: Four times a day (QID) | RECTAL | Status: DC | PRN

## 2016-11-01 MED ORDER — RIVAROXABAN 10 MG PO TABS
10.0000 mg | ORAL_TABLET | Freq: Every day | ORAL | Status: DC
  Administered 2016-11-02: 10 mg via ORAL
  Filled 2016-11-01: qty 1

## 2016-11-01 MED ORDER — DOCUSATE SODIUM 100 MG PO CAPS
100.0000 mg | ORAL_CAPSULE | Freq: Two times a day (BID) | ORAL | Status: DC
  Administered 2016-11-01 – 2016-11-02 (×2): 100 mg via ORAL
  Filled 2016-11-01 (×2): qty 1

## 2016-11-01 MED ORDER — ONDANSETRON HCL 4 MG/2ML IJ SOLN: 4.0000 mg | Freq: Four times a day (QID) | INTRAMUSCULAR | Status: DC | PRN

## 2016-11-01 MED ORDER — BISACODYL 10 MG RE SUPP: 10.0000 mg | Freq: Every day | RECTAL | Status: DC | PRN

## 2016-11-01 MED ORDER — ROPIVACAINE HCL 7.5 MG/ML IJ SOLN
INTRAMUSCULAR | Status: AC
  Filled 2016-11-01: qty 20

## 2016-11-01 MED ORDER — PHENYLEPHRINE HCL 10 MG/ML IJ SOLN
INTRAMUSCULAR | Status: AC
  Filled 2016-11-01: qty 1

## 2016-11-01 MED ORDER — FENTANYL CITRATE (PF) 100 MCG/2ML IJ SOLN
INTRAMUSCULAR | Status: AC
  Filled 2016-11-01: qty 2

## 2016-11-01 MED ORDER — MORPHINE SULFATE (PF) 4 MG/ML IV SOLN
1.0000 mg | INTRAVENOUS | Status: DC | PRN
  Administered 2016-11-01: 16:00:00 1 mg via INTRAVENOUS
  Filled 2016-11-01: qty 1

## 2016-11-01 MED ORDER — CHLORHEXIDINE GLUCONATE 4 % EX LIQD: 60.0000 mL | Freq: Once | CUTANEOUS | Status: DC

## 2016-11-01 MED ORDER — INSULIN ASPART 100 UNIT/ML ~~LOC~~ SOLN
0.0000 [IU] | Freq: Three times a day (TID) | SUBCUTANEOUS | Status: DC
  Administered 2016-11-01: 18:00:00 5 [IU] via SUBCUTANEOUS
  Administered 2016-11-02: 2 [IU] via SUBCUTANEOUS

## 2016-11-01 MED ORDER — PROPOFOL 10 MG/ML IV BOLUS
INTRAVENOUS | Status: AC
  Filled 2016-11-01: qty 40

## 2016-11-01 MED ORDER — DIPHENHYDRAMINE HCL 12.5 MG/5ML PO ELIX: 12.5000 mg | ORAL_SOLUTION | ORAL | Status: DC | PRN

## 2016-11-01 MED ORDER — METOCLOPRAMIDE HCL 5 MG PO TABS
5.0000 mg | ORAL_TABLET | Freq: Three times a day (TID) | ORAL | Status: DC | PRN
Start: 2016-11-01 — End: 2016-11-02

## 2016-11-01 MED ORDER — SODIUM CHLORIDE 0.9 % IV SOLN
INTRAVENOUS | Status: DC
  Administered 2016-11-01 – 2016-11-02 (×2): via INTRAVENOUS

## 2016-11-01 MED ORDER — FENTANYL CITRATE (PF) 100 MCG/2ML IJ SOLN
50.0000 ug | INTRAMUSCULAR | Status: DC | PRN
  Administered 2016-11-01 (×2): 50 ug via INTRAVENOUS

## 2016-11-01 MED ORDER — LACTATED RINGERS IV SOLN
INTRAVENOUS | Status: DC
  Administered 2016-11-01: 1000 mL via INTRAVENOUS

## 2016-11-01 MED ORDER — ACETAMINOPHEN 10 MG/ML IV SOLN
1000.0000 mg | Freq: Once | INTRAVENOUS | Status: AC
  Administered 2016-11-01: 1000 mg via INTRAVENOUS

## 2016-11-01 MED ORDER — CEFAZOLIN SODIUM-DEXTROSE 2-4 GM/100ML-% IV SOLN
2.0000 g | Freq: Four times a day (QID) | INTRAVENOUS | Status: AC
  Administered 2016-11-01: 18:00:00 2 g via INTRAVENOUS
  Filled 2016-11-01: qty 100

## 2016-11-01 MED ORDER — DEXTROSE 5 % IV SOLN
INTRAVENOUS | Status: DC | PRN
  Administered 2016-11-01: 20 ug/min via INTRAVENOUS

## 2016-11-01 MED ORDER — ROPIVACAINE HCL 7.5 MG/ML IJ SOLN
INTRAMUSCULAR | Status: DC | PRN
  Administered 2016-11-01: 20 mL via PERINEURAL

## 2016-11-01 MED ORDER — POLYETHYLENE GLYCOL 3350 17 G PO PACK: 17.0000 g | PACK | Freq: Every day | ORAL | Status: DC | PRN

## 2016-11-01 MED ORDER — ONDANSETRON HCL 4 MG/2ML IJ SOLN
INTRAMUSCULAR | Status: DC | PRN
  Administered 2016-11-01: 4 mg via INTRAVENOUS

## 2016-11-01 MED ORDER — NITROGLYCERIN 0.4 MG SL SUBL: 0.4000 mg | SUBLINGUAL_TABLET | SUBLINGUAL | Status: DC | PRN

## 2016-11-01 MED ORDER — FLEET ENEMA 7-19 GM/118ML RE ENEM: 1.0000 | ENEMA | Freq: Once | RECTAL | Status: DC | PRN

## 2016-11-01 MED ORDER — BUPIVACAINE LIPOSOME 1.3 % IJ SUSP
20.0000 mL | Freq: Once | INTRAMUSCULAR | Status: DC
  Filled 2016-11-01: qty 20

## 2016-11-01 MED ORDER — ACETAMINOPHEN 10 MG/ML IV SOLN
INTRAVENOUS | Status: AC
  Filled 2016-11-01: qty 100

## 2016-11-01 MED ORDER — DEXAMETHASONE SODIUM PHOSPHATE 10 MG/ML IJ SOLN
10.0000 mg | Freq: Once | INTRAMUSCULAR | Status: AC
  Administered 2016-11-02: 08:00:00 10 mg via INTRAVENOUS
  Filled 2016-11-01: qty 1

## 2016-11-01 MED ORDER — SODIUM CHLORIDE 0.9 % IV SOLN
INTRAVENOUS | Status: DC | PRN
  Administered 2016-11-01: 1000 mL via INTRAMUSCULAR

## 2016-11-01 MED ORDER — BUPIVACAINE IN DEXTROSE 0.75-8.25 % IT SOLN
INTRATHECAL | Status: DC | PRN
  Administered 2016-11-01: 2 mL via INTRATHECAL

## 2016-11-01 MED ORDER — ONDANSETRON HCL 4 MG PO TABS: 4.0000 mg | ORAL_TABLET | Freq: Four times a day (QID) | ORAL | Status: DC | PRN

## 2016-11-01 MED ORDER — MENTHOL 3 MG MT LOZG: 1.0000 | LOZENGE | OROMUCOSAL | Status: DC | PRN

## 2016-11-01 MED ORDER — SODIUM CHLORIDE 0.9 % IJ SOLN
INTRAMUSCULAR | Status: AC
  Filled 2016-11-01: qty 50

## 2016-11-01 MED ORDER — METHOCARBAMOL 500 MG PO TABS
500.0000 mg | ORAL_TABLET | Freq: Four times a day (QID) | ORAL | Status: DC | PRN
  Administered 2016-11-02 (×2): 500 mg via ORAL
  Filled 2016-11-01 (×2): qty 1

## 2016-11-01 MED ORDER — BUPIVACAINE LIPOSOME 1.3 % IJ SUSP
INTRAMUSCULAR | Status: DC | PRN
  Administered 2016-11-01: 20 mL

## 2016-11-01 MED ORDER — ACETAMINOPHEN 500 MG PO TABS
1000.0000 mg | ORAL_TABLET | Freq: Four times a day (QID) | ORAL | Status: DC
  Administered 2016-11-01 – 2016-11-02 (×3): 1000 mg via ORAL
  Filled 2016-11-01 (×3): qty 2

## 2016-11-01 MED ORDER — METHOCARBAMOL 1000 MG/10ML IJ SOLN
500.0000 mg | Freq: Four times a day (QID) | INTRAVENOUS | Status: DC | PRN
  Administered 2016-11-01: 500 mg via INTRAVENOUS
  Filled 2016-11-01: qty 5
  Filled 2016-11-01: qty 550

## 2016-11-01 MED ORDER — DUTASTERIDE 0.5 MG PO CAPS
0.5000 mg | ORAL_CAPSULE | Freq: Every day | ORAL | Status: DC
  Administered 2016-11-02: 08:00:00 0.5 mg via ORAL
  Filled 2016-11-01: qty 1

## 2016-11-01 MED ORDER — FENTANYL CITRATE (PF) 100 MCG/2ML IJ SOLN: 25.0000 ug | INTRAMUSCULAR | Status: DC | PRN

## 2016-11-01 MED ORDER — METOCLOPRAMIDE HCL 5 MG/ML IJ SOLN
5.0000 mg | Freq: Three times a day (TID) | INTRAMUSCULAR | Status: DC | PRN
Start: 2016-11-01 — End: 2016-11-02

## 2016-11-01 MED ORDER — DEXAMETHASONE SODIUM PHOSPHATE 10 MG/ML IJ SOLN
INTRAMUSCULAR | Status: AC
  Filled 2016-11-01: qty 1

## 2016-11-01 MED ORDER — SODIUM CHLORIDE 0.9 % IJ SOLN
INTRAMUSCULAR | Status: DC | PRN
  Administered 2016-11-01: 30 mL

## 2016-11-01 MED ORDER — LACTATED RINGERS IV SOLN: INTRAVENOUS | Status: DC

## 2016-11-01 MED ORDER — PROPOFOL 10 MG/ML IV BOLUS
INTRAVENOUS | Status: AC
  Filled 2016-11-01: qty 20

## 2016-11-01 MED ORDER — OXYCODONE HCL 5 MG PO TABS
5.0000 mg | ORAL_TABLET | ORAL | Status: DC | PRN
  Administered 2016-11-01 – 2016-11-02 (×2): 5 mg via ORAL
  Administered 2016-11-02: 08:00:00 10 mg via ORAL
  Filled 2016-11-01: qty 2
  Filled 2016-11-01 (×2): qty 1

## 2016-11-01 MED ORDER — PROPOFOL 500 MG/50ML IV EMUL
INTRAVENOUS | Status: DC | PRN
  Administered 2016-11-01: 75 ug/kg/min via INTRAVENOUS

## 2016-11-01 MED ORDER — TRANEXAMIC ACID 1000 MG/10ML IV SOLN
INTRAVENOUS | Status: DC | PRN
  Administered 2016-11-01: 2000 mg via TOPICAL

## 2016-11-01 MED ORDER — TAMSULOSIN HCL 0.4 MG PO CAPS
0.4000 mg | ORAL_CAPSULE | Freq: Every day | ORAL | Status: DC
  Administered 2016-11-01: 0.4 mg via ORAL
  Filled 2016-11-01: qty 1

## 2016-11-01 SURGICAL SUPPLY — 43 items
BAG DECANTER FOR FLEXI CONT (MISCELLANEOUS) ×2 IMPLANT
BAG SPEC THK2 15X12 ZIP CLS (MISCELLANEOUS)
BAG ZIPLOCK 12X15 (MISCELLANEOUS) IMPLANT
BANDAGE ACE 6X5 VEL STRL LF (GAUZE/BANDAGES/DRESSINGS) ×2 IMPLANT
BLADE SAW RECIPROCATING 77.5 (BLADE) ×2 IMPLANT
BLADE SAW SGTL 13.0X1.19X90.0M (BLADE) ×2 IMPLANT
BOWL SMART MIX CTS (DISPOSABLE) ×2 IMPLANT
BUR OVAL CARBIDE 4.0 (BURR) ×2 IMPLANT
CAPT KNEE PARTIAL 2 ×2 IMPLANT
CEMENT HV SMART SET (Cement) ×2 IMPLANT
CLOTH BEACON ORANGE TIMEOUT ST (SAFETY) ×2 IMPLANT
CUFF TOURN SGL QUICK 34 (TOURNIQUET CUFF) ×2
CUFF TRNQT CYL 34X4X40X1 (TOURNIQUET CUFF) ×1 IMPLANT
DRSG ADAPTIC 3X8 NADH LF (GAUZE/BANDAGES/DRESSINGS) ×2 IMPLANT
DRSG PAD ABDOMINAL 8X10 ST (GAUZE/BANDAGES/DRESSINGS) ×2 IMPLANT
DURAPREP 26ML APPLICATOR (WOUND CARE) ×2 IMPLANT
ELECT REM PT RETURN 9FT ADLT (ELECTROSURGICAL) ×2
ELECTRODE REM PT RTRN 9FT ADLT (ELECTROSURGICAL) ×1 IMPLANT
EVACUATOR 1/8 PVC DRAIN (DRAIN) ×2 IMPLANT
GAUZE SPONGE 4X4 12PLY STRL (GAUZE/BANDAGES/DRESSINGS) ×2 IMPLANT
GLOVE BIO SURGEON STRL SZ7.5 (GLOVE) ×2 IMPLANT
GLOVE BIO SURGEON STRL SZ8 (GLOVE) ×6 IMPLANT
GLOVE BIOGEL PI IND STRL 8 (GLOVE) ×2 IMPLANT
GLOVE BIOGEL PI INDICATOR 8 (GLOVE) ×2
GOWN STRL REUS W/TWL LRG LVL3 (GOWN DISPOSABLE) ×2 IMPLANT
GOWN STRL REUS W/TWL XL LVL3 (GOWN DISPOSABLE) ×3 IMPLANT
HANDPIECE INTERPULSE COAX TIP (DISPOSABLE) ×2
IMMOBILIZER KNEE 20 (SOFTGOODS) ×4
IMMOBILIZER KNEE 20 THIGH 36 (SOFTGOODS) ×1 IMPLANT
KIT IMPL STRL TIB IPOLY IUNI IMPLANT
MANIFOLD NEPTUNE II (INSTRUMENTS) ×2 IMPLANT
PACK TOTAL KNEE CUSTOM (KITS) ×2 IMPLANT
PADDING CAST COTTON 6X4 STRL (CAST SUPPLIES) ×3 IMPLANT
POSITIONER SURGICAL ARM (MISCELLANEOUS) ×2 IMPLANT
SET HNDPC FAN SPRY TIP SCT (DISPOSABLE) ×1 IMPLANT
STRIP CLOSURE SKIN 1/2X4 (GAUZE/BANDAGES/DRESSINGS) ×3 IMPLANT
SUT MNCRL AB 4-0 PS2 18 (SUTURE) ×2 IMPLANT
SUT VIC AB 2-0 CT1 27 (SUTURE) ×4
SUT VIC AB 2-0 CT1 TAPERPNT 27 (SUTURE) ×2 IMPLANT
SUT VLOC 180 0 24IN GS25 (SUTURE) ×2 IMPLANT
SYR 50ML LL SCALE MARK (SYRINGE) ×2 IMPLANT
TRAY FOLEY W/METER SILVER 16FR (SET/KITS/TRAYS/PACK) ×2 IMPLANT
WRAP KNEE MAXI GEL POST OP (GAUZE/BANDAGES/DRESSINGS) ×1 IMPLANT

## 2016-11-01 NOTE — Anesthesia Preprocedure Evaluation (Addendum)
Anesthesia Evaluation  Patient identified by MRN, date of birth, ID band Patient awake    Reviewed: Allergy & Precautions, NPO status , Patient's Chart, lab work & pertinent test results  Airway Mallampati: II  TM Distance: >3 FB Neck ROM: Full    Dental   Pulmonary former smoker,    breath sounds clear to auscultation       Cardiovascular hypertension, Pt. on medications + CAD and + Cardiac Stents  + dysrhythmias  Rhythm:Regular Rate:Normal     Neuro/Psych negative neurological ROS     GI/Hepatic Neg liver ROS, GERD  ,  Endo/Other  diabetes, Type 2  Renal/GU Renal disease     Musculoskeletal  (+) Arthritis ,   Abdominal   Peds  Hematology negative hematology ROS (+)   Anesthesia Other Findings   Reproductive/Obstetrics                            Lab Results  Component Value Date   WBC 5.8 10/24/2016   HGB 15.3 10/20/2016   HCT 45.3 10/24/2016   MCV 94 10/24/2016   PLT 164 10/24/2016   Lab Results  Component Value Date   CREATININE 0.89 10/24/2016   BUN 17 10/24/2016   NA 145 (H) 10/24/2016   K 4.3 10/24/2016   CL 102 10/24/2016   CO2 26 10/24/2016    Anesthesia Physical Anesthesia Plan  ASA: III  Anesthesia Plan: Spinal   Post-op Pain Management:  Regional for Post-op pain   Induction: Intravenous  Airway Management Planned: Natural Airway and Simple Face Mask  Additional Equipment:   Intra-op Plan:   Post-operative Plan:   Informed Consent: I have reviewed the patients History and Physical, chart, labs and discussed the procedure including the risks, benefits and alternatives for the proposed anesthesia with the patient or authorized representative who has indicated his/her understanding and acceptance.   Dental advisory given  Plan Discussed with: CRNA and Surgeon  Anesthesia Plan Comments:        Anesthesia Quick Evaluation

## 2016-11-01 NOTE — Interval H&P Note (Signed)
History and Physical Interval Note:  11/01/2016 11:55 AM  Sean Lane  has presented today for surgery, with the diagnosis of Right knee medial compartment OA  The various methods of treatment have been discussed with the patient and family. After consideration of risks, benefits and other options for treatment, the patient has consented to  Procedure(s): RIGHT UNICOMPARTMENTAL KNEE (Right) as a surgical intervention .  The patient's history has been reviewed, patient examined, no change in status, stable for surgery.  I have reviewed the patient's chart and labs.  Questions were answered to the patient's satisfaction.     Gearlean Alf

## 2016-11-01 NOTE — Anesthesia Procedure Notes (Signed)
Anesthesia Regional Block: Adductor canal block   Pre-Anesthetic Checklist: ,, timeout performed, Correct Patient, Correct Site, Correct Laterality, Correct Procedure, Correct Position, site marked, Risks and benefits discussed,  Surgical consent,  Pre-op evaluation,  At surgeon's request and post-op pain management  Laterality: Right  Prep: chloraprep       Needles:  Injection technique: Single-shot  Needle Type: Echogenic Needle     Needle Length: 9cm  Needle Gauge: 21     Additional Needles:   Procedures: ultrasound guided,,,,,,,,  Narrative:  Start time: 11/01/2016 12:05 PM End time: 11/01/2016 12:10 PM Injection made incrementally with aspirations every 5 mL.  Performed by: Personally  Anesthesiologist: Suzette Battiest

## 2016-11-01 NOTE — Progress Notes (Signed)
AssistedDr. Rob Fitzgerald with right, ultrasound guided, adductor canal block. Side rails up, monitors on throughout procedure. See vital signs in flow sheet. Tolerated Procedure well.  

## 2016-11-01 NOTE — Anesthesia Procedure Notes (Addendum)
Spinal  Start time: 11/01/2016 12:17 PM End time: 11/01/2016 12:22 PM Staffing Anesthesiologist: Suzette Battiest Performed: anesthesiologist  Preanesthetic Checklist Completed: patient identified, site marked, surgical consent, pre-op evaluation, timeout performed, IV checked, risks and benefits discussed and monitors and equipment checked Spinal Block Patient position: sitting Prep: site prepped and draped and DuraPrep Patient monitoring: blood pressure, continuous pulse ox and heart rate Approach: midline Location: L4-5 Injection technique: single-shot Needle Needle type: Pencan  Needle gauge: 24 G Needle length: 9 cm

## 2016-11-01 NOTE — Anesthesia Postprocedure Evaluation (Signed)
Anesthesia Post Note  Patient: Sean Lane  Procedure(s) Performed: Procedure(s) (LRB): RIGHT UNICOMPARTMENTAL KNEE (Right)  Patient location during evaluation: PACU Anesthesia Type: Spinal Level of consciousness: awake and alert Pain management: pain level controlled Vital Signs Assessment: post-procedure vital signs reviewed and stable Respiratory status: spontaneous breathing and respiratory function stable Cardiovascular status: blood pressure returned to baseline and stable Postop Assessment: spinal receding Anesthetic complications: no       Last Vitals:  Vitals:   11/01/16 1430 11/01/16 1450  BP: 130/65 (!) 153/53  Pulse: (!) 56 (!) 54  Resp: 15 16  Temp: 36.4 C 36.4 C    Last Pain:  Vitals:   11/01/16 1450  TempSrc:   PainSc: 2                  Tiajuana Amass

## 2016-11-01 NOTE — Transfer of Care (Signed)
Immediate Anesthesia Transfer of Care Note  Patient: [name]  Procedure(s) Performed: Procedure(s): RIGHT UNICOMPARTMENTAL KNEE (Right)  Patient Location: PACU  Anesthesia Type:MAC and Spinal  Level of Consciousness: awake, alert  and oriented  Airway & Oxygen Therapy: Patient Spontanous Breathing and Patient connected to face mask oxygen  Post-op Assessment: Report given to RN and Post -op Vital signs reviewed and stable  Post vital signs: Reviewed and stable  Last Vitals:  Vitals:   11/01/16 1210 11/01/16 1211  BP:    Pulse:    Resp: 15 15  Temp:      Last Pain:  Vitals:   11/01/16 1126  TempSrc: Oral      Patients Stated Pain Goal: 3 (17/51/02 5852)  Complications: No apparent anesthesia complications

## 2016-11-01 NOTE — H&P (View-Only) (Signed)
Sean Lane DOB: [birth year] Widowed / Language: English / Race: White Male Date of Admission:  11/01/2016 CC:  Right Knee Pain History of Present Illness The patient is a 83 year old male who comes in for a preoperative History and Physical. The patient is scheduled for a right unicompartmental knee arthroplasty to be performed by Dr. Dione Plover. Aluisio, MD at [facility] on 11-01-2016. The patient is a 83 year old male who presents today for follow up of their knee. The patient is being followed for their right knee pain and osteoarthritis. They are now month(s) out from cortisone injection. Symptoms reported include: pain and pain at night. The patient feels that they are doing poorly and report their pain level to be moderate to severe. Current treatment includes: icing. The following medication has been used for pain control: Tylenol. The patient has reported improvement of their symptoms with: Cortisone injections. He has isolated medial compartment arthritis with a normal lateral and patellofemoral compartment. He also wants a more permanent solution to this. He is an excellent candidate for unicompartmental replacement. They have been treated conservatively in the past for the above stated problem and despite conservative measures, they continue to have progressive pain and severe functional limitations and dysfunction. They have failed non-operative management including home exercise, medications, and injections. It is felt that they would benefit from undergoing unicompartmental joint replacement. Risks and benefits of the procedure have been discussed with the patient and they elect to proceed with surgery. There are no active contraindications to surgery such as ongoing infection or rapidly progressive neurological disease.  Problem List/Past Medical  Subacromial impingement of left shoulder (M75.42)  Chronic left shoulder pain (M25.512)  Primary osteoarthritis of right knee  (M17.11)  Cardiac Arrhythmia  Atrial Flutter Coronary artery disease  Diabetes Mellitus, Type II  Diverticulitis Of Colon  Gout  High blood pressure  Hypercholesterolemia  Kidney Stone  Migraine Headache  Prostate Disease  Ulcer disease  Allergies  Sulfa Antibiotics  Hives, Rash, Difficulty breathing. Dilaudid *ANALGESICS - OPIOID*  Confusion, Agitation, Sedation  Family History Diabetes Mellitus  grandfather mothers side Heart Disease  father and grandfather fathers side Rheumatoid Arthritis  mother  Social History Alcohol use  current drinker; only occasionally per week Children  1 Current work status  retired Engineer, agricultural (Currently)  no Drug/Alcohol Rehab (Previously)  no Exercise  Exercises daily; does gym / [employer] Illicit drug use  no Living situation  live with spouse Marital status  married Number of flights of stairs before winded  2-3 Pain Contract  no Tobacco / smoke exposure  no Tobacco use  former smoker; smoke(d) less than 1/2 pack(s) per day  Medication History Fish Oil Burp-Less (1000MG  Capsule, Oral) Active. Aspirin (81MG  Tablet Chewable, Oral) Active. Tamsulosin HCl (0.4MG  Capsule, Oral) Active. Crestor (10MG  Tablet, Oral) Active. Xarelto (20MG  Tablet, Oral) Active. Avodart (0.5MG  Capsule, Oral) Active. Multivitamin Adult (Oral) Active.  Past Surgical History  Arthroscopy of Knee  right Cataract Surgery  bilateral Colon Polyp Removal - Colonoscopy  Foot Surgery  right Heart Stents  Total Knee Replacement  left Vasectomy    Review of Systems General Not Present- Chills, Fatigue, Fever, Memory Loss, Night Sweats, Weight Gain and Weight Loss. Skin Not Present- Eczema, Hives, Itching, Lesions and Rash. HEENT Not Present- Dentures, Double Vision, Headache, Hearing Loss, Tinnitus and Visual Loss. Respiratory Not Present- Allergies, Chronic Cough, Coughing up blood, Shortness of breath at rest and  Shortness of breath with exertion. Cardiovascular Not  Present- Chest Pain, Difficulty Breathing Lying Down, Murmur, Palpitations, Racing/skipping heartbeats and Swelling. Gastrointestinal Not Present- Abdominal Pain, Bloody Stool, Constipation, Diarrhea, Difficulty Swallowing, Heartburn, Jaundice, Loss of appetitie, Nausea and Vomiting. Male Genitourinary Not Present- Blood in Urine, Discharge, Flank Pain, Incontinence, Painful Urination, Urgency, Urinary frequency, Urinary Retention, Urinating at Night and Weak urinary stream. Musculoskeletal Present- Joint Pain. Not Present- Back Pain, Joint Swelling, Morning Stiffness, Muscle Pain, Muscle Weakness and Spasms. Neurological Not Present- Blackout spells, Difficulty with balance, Dizziness, Paralysis, Tremor and Weakness. Psychiatric Not Present- Insomnia.  Vitals  Weight: 180 lb Height: 73.5in Weight was reported by patient. Height was reported by patient. Body Surface Area: 2.07 m Body Mass Index: 23.43 kg/m  Pulse: 56 (Regular)  BP: 140/66 (Sitting, Right Arm, Standard)  Physical Exam  General Mental Status -Alert, cooperative and good historian. General Appearance-pleasant, Not in acute distress. Orientation-Oriented X3. Build & Nutrition-Well nourished and Well developed.  Head and Neck Head-normocephalic, atraumatic . Neck Global Assessment - supple, no bruit auscultated on the right, no bruit auscultated on the left.  Eye Pupil - Bilateral-Regular and Round. Motion - Bilateral-EOMI.  Chest and Lung Exam Auscultation Breath sounds - clear at anterior chest wall and clear at posterior chest wall. Adventitious sounds - No Adventitious sounds.  Cardiovascular Auscultation Rhythm - Regular(with occasional skipped or ectopic beat). Heart Sounds - S1 WNL and S2 WNL. Murmurs & Other Heart Sounds - Auscultation of the heart reveals - No Murmurs.  Abdomen Palpation/Percussion Tenderness - Abdomen is  non-tender to palpation. Rigidity (guarding) - Abdomen is soft. Auscultation Auscultation of the abdomen reveals - Bowel sounds normal.  Male Genitourinary Note: Not done, not pertinent to present illness  Musculoskeletal Note: He is in no distress. His right knee shows no effusion. His range of motion is about 5 to 125. He has marked crepitus on range is motion. Tender medially. There is no lateral tenderness or instability.  RADIOGRAPHS Reviewed again, he had isolated medial compartment arthritis with a normal lateral and patellofemoral compartment.  Assessment & Plan  Primary osteoarthritis of right knee (M17.11)  Note:Surgical Plans: Right Unicompartmental Knee Replacement  Disposition: Home and straight to outpatient therapy at [facility] to start on Friday 11/03/2016.  PCP: Dr. [name] Cards: Dr. Jenkins Rouge - Pending at time of H&P  Topical TXA  Anesthesia Issues: None  Patient was instructed on what medications to stop prior to surgery.  Signed electronically by Ok Edwards, III PA-C

## 2016-11-01 NOTE — Op Note (Signed)
OPERATIVE REPORT-UNICOMPARTMENTAL ARTHROPLASTY  PREOPERATIVE DIAGNOSIS: Medial compartment osteoarthritis, Right knee  POSTOPERATIVE DIAGNOSIS: Medial compartment osteoarthritis, Right knee  PROCEDURE:Right knee medial unicompartmental arthroplasty.   SURGEON: [name], MD   ASSISTANT: Arlee Muslim, PA-C  ANESTHESIA:  Adductor canal block and spinal.   ESTIMATED BLOOD LOSS: Minimal.   DRAINS: Hemovac x1.   TOURNIQUET TIME:  32 minutes @ [id number] mm HG   COMPLICATIONS: None.   CONDITION: Stable to recovery.   BRIEF CLINICAL NOTE:Sean Lane is a 83 y.o. male, who has  significant isolated medial compartment arthritis of the Right knee. The patient has had nonoperative management including injections of cortisone and viscous supplements. Unfortunately, the pain persists.  Radiograph showed isolated medial compartment bone-on-bone arthritis  with normal-appearing patellofemoral and lateral compartments. The patient presents now for left knee unicompartmental arthroplasty.   PROCEDURE IN DETAIL: After successful administration of  Adductor canal block and spinal anesthetic, a tourniquet was placed high on the  Right thigh and the Right lower extremity prepped and draped in usual sterile fashion. Extremity was wrapped in an Esmarch, knee flexed, and tourniquet inflated to 300 mmHg.       A midline incision was made with a 10 blade through subcutaneous  tissue to the extensor mechanism. A fresh blade was used to make a  medial parapatellar arthrotomy. Soft tissue on the proximal medial  tibia subperiosteally elevated to the joint line with a knife and into  the semimembranosus bursa with a Cobb elevator. The patella was  subluxed laterally, and the knee flexed 90 degrees. The ACL was intact.  The marginal osteophytes on the medial femur and tibia were removed with  a rongeur. The medial meniscus was also removed. The femoral cutting  block for the conformis  unicompartmental knee system was placed along  the femur. There was excellent fit. I traced the outline. We then  removed any remaining cartilage within this outline. We then placed the  cutting block again and pinned in position. The posterior femoral cut  was made, it was approximately 5 mm. The lug holes for the femoral  component were then drilled through the cutting block. The cutting  block was subsequently removed. We then utilized the high speed burr to  create a small trough at the superior aspect of the component tomake it inset and would not overhang the cartilage. The trial was placed,  it had excellent fit. The trial was subsequently removed.       The trial was placed again and the B chip was placed. There was  excellent balance throughout full motion. Also with excellent fit on  the tibia. This was removed as was the femoral trial. A curette was  used to remove any remaining cartilage from the tibia. The tibial  cutting block was then placed and there was a perfect fit on the tibial  surface. The appropriate slope was placed and it was pinned in  position. The reciprocating saw was used to make the central cut and  then the oscillating saw used to make the horizontal cut. The bone  fragment was then removed. The tibial trial was placed and had perfect  fit on the tibia. We then drilled the 2 lug holes and did the keel punch.  We then placed tibia trial and femur trial, and a 6 mm trial insert. There was  excellent stability throughout full range of motion and no impingement.  The trial was then removed. We drilled small holes in the distal  femur in order to create more conduits for the cement. The cut bone  surfaces were thoroughly irrigated with pulsatile lavage while the  cement was mixed on the back table. We then cemented the tibial  component into place, impacted it and removed the extruded cement. The  same was done for the femoral component. Trial 6-mm inserts  placed,  knee held in full extension, and all extruded cement removed. While the  cement was hardening, I injected the extensor mechanism, periosteum of  the femur and subcu tissues, a total of 20 mL of Exparel mixed with 30  mL of saline and then did an additional injection of 20 mL of 0.25%  Marcaine into the same tissues. When the cement had fully hardened,  then the permanent polyethylene was placed in tibial tray. There was  excellent stability throughout full range of motion with no lift off the  component and no evidence of any impingement.       Wound was copiously irrigated with saline solution, and the arthrotomy closed over a Hemovac drain with a running #1 V-Loc suture. The subcutaneous was closed with  interrupted 2-0 Vicryl and subcuticular running 4-0 Monocryl. The drain  was hooked to suction. Incision cleaned and dried and Steri-Strips and  a bulky sterile dressing applied. The tourniquet was released after a  total time of 32 minutes. This was done after closing the extensor  mechanism. The wound was closed and a bulky sterile dressing was  applied. The operative limb was placed into a knee immobilizer, and the patient awakened and transported to recovery room in stable condition.       Please note that a surgical assistant was a medical necessity for this  procedure in order to perform it in a safe and expeditious manner.  Assistance was necessary for retracting vital ligaments, neurovascular  structures, as well as for proper positioning of the limb to allow for  appropriate bone cuts and appropriate placement of the prosthesis.    [name], MD

## 2016-11-02 ENCOUNTER — Encounter (HOSPITAL_COMMUNITY): Payer: Self-pay | Admitting: Orthopedic Surgery

## 2016-11-02 DIAGNOSIS — M1711 Unilateral primary osteoarthritis, right knee: Secondary | ICD-10-CM | POA: Diagnosis not present

## 2016-11-02 DIAGNOSIS — I4892 Unspecified atrial flutter: Secondary | ICD-10-CM | POA: Diagnosis not present

## 2016-11-02 DIAGNOSIS — I10 Essential (primary) hypertension: Secondary | ICD-10-CM | POA: Diagnosis not present

## 2016-11-02 DIAGNOSIS — M25761 Osteophyte, right knee: Secondary | ICD-10-CM | POA: Diagnosis not present

## 2016-11-02 DIAGNOSIS — I2510 Atherosclerotic heart disease of native coronary artery without angina pectoris: Secondary | ICD-10-CM | POA: Diagnosis not present

## 2016-11-02 DIAGNOSIS — E119 Type 2 diabetes mellitus without complications: Secondary | ICD-10-CM | POA: Diagnosis not present

## 2016-11-02 LAB — BASIC METABOLIC PANEL
Anion gap: 6 (ref 5–15)
BUN: 14 mg/dL (ref 6–20)
CHLORIDE: 109 mmol/L (ref 101–111)
CO2: 24 mmol/L (ref 22–32)
Calcium: 9.5 mg/dL (ref 8.9–10.3)
Creatinine, Ser: 0.94 mg/dL (ref 0.61–1.24)
GFR calc Af Amer: >60 mL/min (ref >60)
GFR calc non Af Amer: >60 mL/min (ref >60)
GLUCOSE: 147 mg/dL — AB (ref 65–99)
Potassium: 4.1 mmol/L (ref 3.5–5.1)
Sodium: 139 mmol/L (ref 135–145)

## 2016-11-02 LAB — CBC
HEMATOCRIT: 37.3 % — AB (ref 39.0–52.0)
HEMOGLOBIN: 13.3 g/dL (ref 13.0–17.0)
MCH: 31.8 pg (ref 26.0–34.0)
MCHC: 35.7 g/dL (ref 30.0–36.0)
MCV: 89.2 fL (ref 78.0–100.0)
Platelets: 133 10*3/uL — ABNORMAL LOW (ref 150–400)
RBC: 4.18 MIL/uL — AB (ref 4.22–5.81)
RDW: 13.2 % (ref 11.5–15.5)
WBC: 11.1 10*3/uL — ABNORMAL HIGH (ref 4.0–10.5)

## 2016-11-02 LAB — GLUCOSE, CAPILLARY: GLUCOSE-CAPILLARY: 131 mg/dL — AB (ref 65–99)

## 2016-11-02 MED ORDER — METHOCARBAMOL 500 MG PO TABS: 500.0000 mg | ORAL_TABLET | Freq: Four times a day (QID) | ORAL | 0 refills | Status: DC | PRN

## 2016-11-02 MED ORDER — OXYCODONE HCL 5 MG PO TABS: 5.0000 mg | ORAL_TABLET | ORAL | 0 refills | Status: DC | PRN

## 2016-11-02 NOTE — Discharge Instructions (Signed)
° °Dr. Frank Aluisio °Total Joint Specialist °[facility] °[address] °[place], [place] [address] °[phone] ° °UNI KNEE REPLACEMENT POSTOPERATIVE DIRECTIONS ° ° °Knee Rehabilitation, Guidelines Following Surgery  °Results after knee surgery are often greatly improved when you follow the exercise, range of motion and muscle strengthening exercises prescribed by your doctor. Safety measures are also important to protect the knee from further injury. Any time any of these exercises cause you to have increased pain or swelling in your knee joint, decrease the amount until you are comfortable again and slowly increase them. If you have problems or questions, call your caregiver or physical therapist for advice.  ° °HOME CARE INSTRUCTIONS  °Remove items at home which could result in a fall. This includes throw rugs or furniture in walking pathways.  °· ICE to the affected knee every three hours for 30 minutes at a time and then as needed for pain and swelling.  Continue to use ice on the knee for pain and swelling from surgery. You may notice swelling that will progress down to the foot and ankle.  This is normal after surgery.  Elevate the leg when you are not up walking on it.   °· Continue to use the breathing machine which will help keep your temperature down.  It is common for your temperature to cycle up and down following surgery, especially at night when you are not up moving around and exerting yourself.  The breathing machine keeps your lungs expanded and your temperature down. °· Do not place pillow under knee, focus on keeping the knee straight while resting ° °DIET °You may resume your previous home diet once your are discharged from the hospital. ° °DRESSING / WOUND CARE / SHOWERING °You may shower 3 days after surgery, but keep the wounds dry during showering.  You may use an occlusive plastic wrap (Press'n Seal for example), NO SOAKING/SUBMERGING IN THE BATHTUB.  If the  bandage gets wet, change with a clean dry gauze.  If the incision gets wet, pat the wound dry with a clean towel. °You may start showering once you are discharged home but do not submerge the incision under water. Just pat the incision dry and apply a dry gauze dressing on daily. °Change the surgical dressing daily and reapply a dry dressing each time. ° °ACTIVITY °Walk with your walker as instructed. °Use walker as long as suggested by your caregivers. °Avoid periods of inactivity such as sitting longer than an hour when not asleep. This helps prevent blood clots.  °You may resume a sexual relationship in one month or when given the OK by your doctor.  °You may return to work once you are cleared by your doctor.  °Do not drive a car for 6 weeks or until released by you surgeon.  °Do not drive while taking narcotics. ° °WEIGHT BEARING °Weight bearing as tolerated with assist device (walker, cane, etc) as directed, use it as long as suggested by your surgeon or therapist, typically at least 4-6 weeks. ° °POSTOPERATIVE CONSTIPATION PROTOCOL °Constipation - defined medically as fewer than three stools per week and severe constipation as less than one stool per week. ° °One of the most common issues patients have following surgery is constipation.  Even if you have a regular bowel pattern at home, your normal regimen is likely to be disrupted due to multiple reasons following surgery.  Combination of anesthesia, postoperative narcotics, change in appetite and fluid intake all can affect your bowels.    In order to avoid complications following surgery, here are some recommendations in order to help you during your recovery period.  Colace (docusate) - Pick up an over-the-counter form of Colace or another stool softener and take twice a day as long as you are requiring postoperative pain medications.  Take with a full glass of water daily.  If you experience loose stools or diarrhea, hold the colace until you stool forms  back up.  If your symptoms do not get better within 1 week or if they get worse, check with your doctor.  Dulcolax (bisacodyl) - Pick up over-the-counter and take as directed by the product packaging as needed to assist with the movement of your bowels.  Take with a full glass of water.  Use this product as needed if not relieved by Colace only.   MiraLax (polyethylene glycol) - Pick up over-the-counter to have on hand.  MiraLax is a solution that will increase the amount of water in your bowels to assist with bowel movements.  Take as directed and can mix with a glass of water, juice, soda, coffee, or tea.  Take if you go more than two days without a movement. Do not use MiraLax more than once per day. Call your doctor if you are still constipated or irregular after using this medication for 7 days in a row.  If you continue to have problems with postoperative constipation, please contact the office for further assistance and recommendations.  If you experience "the worst abdominal pain ever" or develop nausea or vomiting, please contact the office immediatly for further recommendations for treatment.  ITCHING  If you experience itching with your medications, try taking only a single pain pill, or even half a pain pill at a time.  You can also use Benadryl over the counter for itching or also to help with sleep.   TED HOSE STOCKINGS Wear the elastic stockings on both legs for three weeks following surgery during the day but you may remove then at night for sleeping.  MEDICATIONS See your medication summary on the After Visit Summary that the nursing staff will review with you prior to discharge.  You may have some home medications which will be placed on hold until you complete the course of blood thinner medication.  It is important for you to complete the blood thinner medication as prescribed by your surgeon.  Continue your approved medications as instructed at time of  discharge.  PRECAUTIONS If you experience chest pain or shortness of breath - call 911 immediately for transfer to the hospital emergency department.  If you develop a fever greater that 101 F, purulent drainage from wound, increased redness or drainage from wound, foul odor from the wound/dressing, or calf pain - CONTACT YOUR SURGEON.                                                   FOLLOW-UP APPOINTMENTS Make sure you keep all of your appointments after your operation with your surgeon and caregivers. You should call the office at the above phone number and make an appointment for approximately two weeks after the date of your surgery or on the date instructed by your surgeon outlined in the "After Visit Summary".  RANGE OF MOTION AND STRENGTHENING EXERCISES  Rehabilitation of the knee is important following a knee injury or an  operation. After just a few days of immobilization, the muscles of the thigh which control the knee become weakened and shrink (atrophy). Knee exercises are designed to build up the tone and strength of the thigh muscles and to improve knee motion. Often times heat used for twenty to thirty minutes before working out will loosen up your tissues and help with improving the range of motion but do not use heat for the first two weeks following surgery. These exercises can be done on a training (exercise) mat, on the floor, on a table or on a bed. Use what ever works the best and is most comfortable for you Knee exercises include:  Leg Lifts - While your knee is still immobilized in a splint or cast, you can do straight leg raises. Lift the leg to 60 degrees, hold for 3 sec, and slowly lower the leg. Repeat 10-20 times 2-3 times daily. Perform this exercise against resistance later as your knee gets better.  Quad and Hamstring Sets - Tighten up the muscle on the front of the thigh (Quad) and hold for 5-10 sec. Repeat this 10-20 times hourly. Hamstring sets are done by pushing the  foot backward against an object and holding for 5-10 sec. Repeat as with quad sets.   Leg Slides: Lying on your back, slowly slide your foot toward your buttocks, bending your knee up off the floor (only go as far as is comfortable). Then slowly slide your foot back down until your leg is flat on the floor again.  Angel Wings: Lying on your back spread your legs to the side as far apart as you can without causing discomfort.  A rehabilitation program following serious knee injuries can speed recovery and prevent re-injury in the future due to weakened muscles. Contact your doctor or a physical therapist for more information on knee rehabilitation.   IF YOU ARE TRANSFERRED TO A SKILLED REHAB FACILITY If the patient is transferred to a skilled rehab facility following release from the hospital, a list of the current medications will be sent to the facility for the patient to continue.  When discharged from the skilled rehab facility, please have the facility set up the patient's Union prior to being released. Also, the skilled facility will be responsible for providing the patient with their medications at time of release from the facility to include their pain medication, the muscle relaxants, and their blood thinner medication. If the patient is still at the rehab facility at time of the two week follow up appointment, the skilled rehab facility will also need to assist the patient in arranging follow up appointment in our office and any transportation needs.  MAKE SURE YOU:  Understand these instructions.  Get help right away if you are not doing well or get worse.    Pick up stool softner and laxative for home use following surgery while on pain medications. Do not submerge incision under water. Please use good hand washing techniques while changing dressing each day. May shower starting three days after surgery. Please use a clean towel to pat the incision dry following  showers. Continue to use ice for pain and swelling after surgery. Do not use any lotions or creams on the incision until instructed by your surgeon.  Resume the Xarelto 20 mg dosing tomorrow (Friday 2/23)  following discharge from the hospital.  Patient to receive dose of Xarelto at hospital on Thursday 2/22.

## 2016-11-02 NOTE — Evaluation (Signed)
Physical Therapy Evaluation Patient Details Name: Sean Lane MRN: [id number] DOB: [birth year] Today's Date: 11/02/2016   History of Present Illness  RTKR  Clinical Impression  Pt s/p R TKR and presents with decreased R LE strength/ROM and post op pain limiting functional mobility.  Pt should progress to dc home with family assist.    Follow Up Recommendations Outpatient PT    Equipment Recommendations  None recommended by PT    Recommendations for Other Services OT consult     Precautions / Restrictions Precautions Precautions: Knee;Fall Required Braces or Orthoses: Knee Immobilizer - Right Knee Immobilizer - Right: Discontinue once straight leg raise with < 10 degree lag (Pt performed IND SLR this am) Restrictions Weight Bearing Restrictions: No Other Position/Activity Restrictions: WBAT      Mobility  Bed Mobility Overal bed mobility: Needs Assistance Bed Mobility: Supine to Sit     Supine to sit: Min guard     General bed mobility comments: cues for sequence and use of L LE to self assist  Transfers Overall transfer level: Needs assistance Equipment used: Rolling walker (2 wheeled) Transfers: Sit to/from Stand Sit to Stand: Min assist         General transfer comment: cues for LE management and use of UEs to self assist  Ambulation/Gait Ambulation/Gait assistance: Min assist;Min guard Ambulation Distance (Feet): 100 Feet Assistive device: Rolling walker (2 wheeled) Gait Pattern/deviations: Step-to pattern;Decreased step length - right;Decreased step length - left;Shuffle;Trunk flexed Gait velocity: decr Gait velocity interpretation: Below normal speed for age/gender General Gait Details: cues for sequence, posture and position from [employer]            Wheelchair Mobility    Modified Rankin (Stroke Patients Only)       Balance Overall balance assessment: No apparent balance deficits (not formally assessed)                                            Pertinent Vitals/Pain Pain Assessment: 0-10 Pain Score: 3  Pain Location: r knee Pain Descriptors / Indicators: Aching;Sore Pain Intervention(s): Limited activity within patient's tolerance;Monitored during session;Premedicated before session;Ice applied    Home Living Family/patient expects to be discharged to:: Private residence Living Arrangements: Spouse/significant other Available Help at Discharge: Family Type of Home: House Home Access: Stairs to enter Entrance Stairs-Rails: None Technical brewer of Steps: 3 Home Layout: One level Home Equipment: Environmental consultant - 2 wheels      Prior Function Level of Independence: Independent               Hand Dominance        Extremity/Trunk Assessment   Upper Extremity Assessment Upper Extremity Assessment: Overall WFL for tasks assessed    Lower Extremity Assessment Lower Extremity Assessment: RLE deficits/detail RLE Deficits / Details: Quad strength 3/5 with IND SLR and AAROM at knee -12 - 100       Communication   Communication: No difficulties  Cognition Arousal/Alertness: Awake/alert Behavior During Therapy: WFL for tasks assessed/performed Overall Cognitive Status: Within Functional Limits for tasks assessed                      General Comments      Exercises Total Joint Exercises Ankle Circles/Pumps: AROM;Both;15 reps;Supine Quad Sets: AROM;Both;10 reps;Supine Heel Slides: AAROM;Right;15 reps;Supine Straight Leg Raises: AAROM;AROM;Right;15 reps;Supine   Assessment/Plan  PT Assessment Patient needs continued PT services  PT Problem List Decreased strength;Decreased range of motion;Decreased activity tolerance;Decreased mobility;Decreased knowledge of use of DME;Pain       PT Treatment Interventions DME instruction;Gait training;Stair training;Functional mobility training;Therapeutic activities;Therapeutic exercise;Patient/family education    PT  Goals (Current goals can be found in the Care Plan section)  Acute Rehab PT Goals Patient Stated Goal: home today PT Goal Formulation: With patient Time For Goal Achievement: 11/02/16 Potential to Achieve Goals: Good    Frequency 7X/week   Barriers to discharge        Co-evaluation               End of Session Equipment Utilized During Treatment: Gait belt Activity Tolerance: Patient tolerated treatment well Patient left: in chair;with call bell/phone within reach Nurse Communication: Mobility status PT Visit Diagnosis: Difficulty in walking, not elsewhere classified (R26.2)    Functional Assessment Tool Used: AM-PAC 6 Clicks Basic Mobility Functional Limitation: Mobility: Walking and moving around Mobility: Walking and Moving Around Current Status [id number]): At least 40 percent but less than 60 percent impaired, limited or restricted Mobility: Walking and Moving Around Goal Status [phone]): At least 1 percent but less than 20 percent impaired, limited or restricted    Time: 0909-0940 PT Time Calculation (min) (ACUTE ONLY): 31 min   Charges:   PT Evaluation $PT Eval Low Complexity: 1 Procedure PT Treatments $Therapeutic Exercise: 8-22 mins   PT G Codes:   PT G-Codes **NOT FOR INPATIENT CLASS** Functional Assessment Tool Used: AM-PAC 6 Clicks Basic Mobility Functional Limitation: Mobility: Walking and moving around Mobility: Walking and Moving Around Current Status [id number]): At least 40 percent but less than 60 percent impaired, limited or restricted Mobility: Walking and Moving Around Goal Status [phone]): At least 1 percent but less than 20 percent impaired, limited or restricted     Cynthia Stainback 11/02/2016, 3:25 PM

## 2016-11-02 NOTE — Care Management Note (Signed)
Case Management Note  Patient Details  Name: Sean Lane MRN: [id number] Date of Birth: [birth year]  Subjective/Objective:                  RTKR Action/Plan: Discharge planning Expected Discharge Date:  11/02/16               Expected Discharge Plan:  Home/Self Care  In-House Referral:     Discharge planning Services  CM Consult  Post Acute Care Choice:  NA Choice offered to:  Patient  DME Arranged:  N/A DME Agency:  NA  HH Arranged:  NA HH Agency:  NA  Status of Service:  Completed, signed off  If discussed at [place] of Stay Meetings, dates discussed:    Additional Comments: CM met  With pt in room to confirm plan is for outpt PT; pt confirms. Pt states he has all DME needed at home.  NO other CM needs were communicated. Dellie Catholic, RN 11/02/2016, 1:53 PM

## 2016-11-02 NOTE — Evaluation (Signed)
Occupational Therapy Evaluation Patient Details Name: Sean Lane MRN: [id number] DOB: August 10, 1928 Today's Date: 11/02/2016    History of Present Illness RTKR   Clinical Impression   OT education complete. No DME needs    Follow Up Recommendations  No OT follow up    Equipment Recommendations  None recommended by OT       Precautions / Restrictions Precautions Precautions: None Restrictions Weight Bearing Restrictions: No      Mobility Bed Mobility               General bed mobility comments: pt in chair  Transfers Overall transfer level: Needs assistance Equipment used: Rolling walker (2 wheeled) Transfers: Sit to/from Stand;Stand Pivot Transfers Sit to Stand: Supervision Stand pivot transfers: Supervision                 ADL Overall ADL's : Needs assistance/impaired                     Lower Body Dressing: Supervision/safety;Sit to/from stand;Cueing for safety;Cueing for compensatory techniques   Toilet Transfer: Supervision/safety;RW   Toileting- Water quality scientist and Hygiene: Supervision/safety;Sit to/from stand;Cueing for safety;Cueing for sequencing   Tub/ Shower Transfer: Walk-in shower;Cueing for safety;Cueing for sequencing;Ambulation;Supervision/safety   Functional mobility during ADLs: Supervision/safety;Cueing for sequencing;Cueing for safety General ADL Comments: Pt overall S with ADL activity using techniques in which OT educated pt, using walker, and safety strategies.     Vision Patient Visual Report: No change from baseline              Pertinent Vitals/Pain Pain Assessment: 0-10 Pain Score: 1  Pain Location: r knee Pain Intervention(s): Monitored during session;Repositioned;Ice applied     Hand Dominance     Extremity/Trunk Assessment Upper Extremity Assessment Upper Extremity Assessment: Overall WFL for tasks assessed           Communication Communication Communication: No difficulties    Cognition Arousal/Alertness: Awake/alert Behavior During Therapy: WFL for tasks assessed/performed Overall Cognitive Status: Within Functional Limits for tasks assessed                                Home Living Family/patient expects to be discharged to:: Private residence Living Arrangements: Spouse/significant other                 Bathroom Shower/Tub: Occupational psychologist: Handicapped height     Home Equipment: None          Prior Functioning/Environment                            OT Goals(Current goals can be found in the care plan section) Acute Rehab OT Goals Patient Stated Goal: home today OT Goal Formulation: With patient Time For Goal Achievement: 11/02/16  OT Frequency:                End of Session Equipment Utilized During Treatment: Rolling walker CPM Right Knee CPM Right Knee: Off Nurse Communication: Mobility status  Activity Tolerance: Patient tolerated treatment well Patient left: in chair;with call bell/phone within reach;with family/visitor present  OT Visit Diagnosis: Unsteadiness on feet (R26.81)                ADL either performed or assessed with clinical judgement  Time: 1000-1018 OT Time Calculation (min): 18 min Charges:  OT General Charges $OT Visit: 1 Procedure OT  Evaluation $OT Eval Low Complexity: 1 Procedure G-Codes: OT G-codes **NOT FOR INPATIENT CLASS** Functional Assessment Tool Used: Clinical judgement Functional Limitation: Self care Self Care Current Status [id number]): At least 1 percent but less than 20 percent impaired, limited or restricted Self Care Goal Status [id number]): 0 percent impaired, limited or restricted Self Care Discharge Status [phone]): At least 1 percent but less than 20 percent impaired, limited or restricted     [place], Thereasa Parkin 11/02/2016, 10:31 AM
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
## 2016-11-02 NOTE — Progress Notes (Signed)
Physical Therapy Treatment Patient Details Name: Sean Lane MRN: [id number] DOB: Dec 09, 1927 Today's Date: 11/02/2016    History of Present Illness RTKR    PT Comments    Pt progressing well with mobility and eager for dc home.  Pt spouse present and participating to review home therex and stairs.   Follow Up Recommendations  Outpatient PT     Equipment Recommendations  None recommended by PT    Recommendations for Other Services OT consult     Precautions / Restrictions Precautions Precautions: Knee;Fall Required Braces or Orthoses: Knee Immobilizer - Right Knee Immobilizer - Right: Discontinue once straight leg raise with < 10 degree lag Restrictions Weight Bearing Restrictions: No Other Position/Activity Restrictions: WBAT    Mobility  Bed Mobility Overal bed mobility: Needs Assistance Bed Mobility: Supine to Sit     Supine to sit: Min guard     General bed mobility comments: cues for sequence and use of L LE to self assist  Transfers Overall transfer level: Needs assistance Equipment used: Rolling walker (2 wheeled) Transfers: Sit to/from Stand Sit to Stand: Min guard;Supervision         General transfer comment: cues for LE management and use of UEs to self assist  Ambulation/Gait Ambulation/Gait assistance: Min guard;Supervision Ambulation Distance (Feet): 75 Feet (and 50') Assistive device: Rolling walker (2 wheeled) Gait Pattern/deviations: Step-to pattern;Decreased step length - right;Decreased step length - left;Shuffle;Trunk flexed Gait velocity: decr Gait velocity interpretation: Below normal speed for age/gender General Gait Details: min cues for sequence, posture and position from RW   Stairs Stairs: Yes   Stair Management: No rails;One rail Left;Step to pattern;Forwards;Backwards;With walker;With cane Number of Stairs: 8 General stair comments: 4 stairs fwd with rail and cane, 2 stairs twice bkwd with RW - spouse assisting  on second attempt.  Cues for sequence and foot placement  Wheelchair Mobility    Modified Rankin (Stroke Patients Only)       Balance Overall balance assessment: No apparent balance deficits (not formally assessed)                                  Cognition Arousal/Alertness: Awake/alert Behavior During Therapy: WFL for tasks assessed/performed Overall Cognitive Status: Within Functional Limits for tasks assessed                      Exercises Total Joint Exercises Ankle Circles/Pumps: AROM;Both;15 reps;Supine Quad Sets: AROM;Both;10 reps;Supine Heel Slides: AAROM;Right;15 reps;Supine Straight Leg Raises: AAROM;AROM;Right;15 reps;Supine    General Comments        Pertinent Vitals/Pain Pain Assessment: 0-10 Pain Score: 3  Pain Location: r knee Pain Descriptors / Indicators: Aching;Sore Pain Intervention(s): Limited activity within patient's tolerance;Monitored during session;Premedicated before session    [place] expects to be discharged to:: Private residence Living Arrangements: Spouse/significant other Available Help at Discharge: Family Type of Home: House Home Access: Stairs to enter Entrance Stairs-Rails: None Home Layout: One level Home Equipment: Environmental consultant - 2 wheels      Prior Function Level of Independence: Independent          PT Goals (current goals can now be found in the care plan section) Acute Rehab PT Goals Patient Stated Goal: home today PT Goal Formulation: With patient Time For Goal Achievement: 11/02/16 Potential to Achieve Goals: Good Progress towards PT goals: Progressing toward goals    Frequency    7X/week  PT Plan Current plan remains appropriate    Co-evaluation             End of Session Equipment Utilized During Treatment: Gait belt Activity Tolerance: Patient tolerated treatment well Patient left: in chair;with call bell/phone within reach Nurse Communication:  Mobility status PT Visit Diagnosis: Difficulty in walking, not elsewhere classified (R26.2)     Time: 1113-1140 PT Time Calculation (min) (ACUTE ONLY): 27 min  Charges:  $Gait Training: 8-22 mins $Therapeutic Exercise: 8-22 mins $Therapeutic Activity: 8-22 mins                    G Codes:  Functional Assessment Tool Used: AM-PAC 6 Clicks Basic Mobility Functional Limitation: Mobility: Walking and moving around Mobility: Walking and Moving Around Current Status [id number]): At least 40 percent but less than 60 percent impaired, limited or restricted Mobility: Walking and Moving Around Goal Status [phone]): At least 1 percent but less than 20 percent impaired, limited or restricted    Keisa Blow 11/02/2016, 3:35 PM
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
## 2016-11-03 DIAGNOSIS — M25561 Pain in right knee: Secondary | ICD-10-CM | POA: Diagnosis not present

## 2016-11-06 DIAGNOSIS — M25561 Pain in right knee: Secondary | ICD-10-CM | POA: Diagnosis not present

## 2016-11-08 DIAGNOSIS — M25561 Pain in right knee: Secondary | ICD-10-CM | POA: Diagnosis not present

## 2016-11-09 DIAGNOSIS — Z96651 Presence of right artificial knee joint: Secondary | ICD-10-CM | POA: Diagnosis not present

## 2016-11-09 DIAGNOSIS — Z471 Aftercare following joint replacement surgery: Secondary | ICD-10-CM | POA: Diagnosis not present

## 2016-11-10 DIAGNOSIS — M25561 Pain in right knee: Secondary | ICD-10-CM | POA: Diagnosis not present

## 2016-11-13 DIAGNOSIS — M25561 Pain in right knee: Secondary | ICD-10-CM | POA: Diagnosis not present

## 2016-11-14 DIAGNOSIS — Z471 Aftercare following joint replacement surgery: Secondary | ICD-10-CM | POA: Diagnosis not present

## 2016-11-14 DIAGNOSIS — Z96651 Presence of right artificial knee joint: Secondary | ICD-10-CM | POA: Diagnosis not present

## 2016-11-15 DIAGNOSIS — M25561 Pain in right knee: Secondary | ICD-10-CM | POA: Diagnosis not present

## 2016-11-17 DIAGNOSIS — M25561 Pain in right knee: Secondary | ICD-10-CM | POA: Diagnosis not present

## 2016-11-19 ENCOUNTER — Encounter: Payer: Self-pay | Admitting: Cardiovascular Disease

## 2016-11-20 DIAGNOSIS — M25561 Pain in right knee: Secondary | ICD-10-CM | POA: Diagnosis not present

## 2016-11-22 DIAGNOSIS — M25561 Pain in right knee: Secondary | ICD-10-CM | POA: Diagnosis not present

## 2016-11-24 DIAGNOSIS — M25561 Pain in right knee: Secondary | ICD-10-CM | POA: Diagnosis not present

## 2016-11-27 DIAGNOSIS — M25561 Pain in right knee: Secondary | ICD-10-CM | POA: Diagnosis not present

## 2016-11-29 DIAGNOSIS — M25561 Pain in right knee: Secondary | ICD-10-CM | POA: Diagnosis not present

## 2016-12-01 DIAGNOSIS — M25561 Pain in right knee: Secondary | ICD-10-CM | POA: Diagnosis not present

## 2016-12-03 ENCOUNTER — Other Ambulatory Visit: Payer: Self-pay | Admitting: Family Medicine

## 2016-12-04 ENCOUNTER — Encounter: Payer: Self-pay | Admitting: Family Medicine

## 2016-12-04 ENCOUNTER — Other Ambulatory Visit: Payer: Self-pay

## 2016-12-04 ENCOUNTER — Ambulatory Visit (INDEPENDENT_AMBULATORY_CARE_PROVIDER_SITE_OTHER): Payer: Medicare Other | Admitting: Family Medicine

## 2016-12-04 DIAGNOSIS — M25561 Pain in right knee: Secondary | ICD-10-CM | POA: Diagnosis not present

## 2016-12-04 DIAGNOSIS — D696 Thrombocytopenia, unspecified: Secondary | ICD-10-CM | POA: Diagnosis not present

## 2016-12-04 DIAGNOSIS — E119 Type 2 diabetes mellitus without complications: Secondary | ICD-10-CM

## 2016-12-04 DIAGNOSIS — I2510 Atherosclerotic heart disease of native coronary artery without angina pectoris: Secondary | ICD-10-CM | POA: Diagnosis not present

## 2016-12-04 DIAGNOSIS — I4892 Unspecified atrial flutter: Secondary | ICD-10-CM

## 2016-12-04 MED ORDER — TAMSULOSIN HCL 0.4 MG PO CAPS: 0.4000 mg | ORAL_CAPSULE | Freq: Every day | ORAL | 1 refills | Status: DC

## 2016-12-04 MED ORDER — RIVAROXABAN 20 MG PO TABS: ORAL_TABLET | ORAL | 1 refills | Status: DC

## 2016-12-04 MED ORDER — ROSUVASTATIN CALCIUM 10 MG PO TABS: 10.0000 mg | ORAL_TABLET | Freq: Every day | ORAL | 3 refills | Status: DC

## 2016-12-04 MED ORDER — DUTASTERIDE 0.5 MG PO CAPS: 0.5000 mg | ORAL_CAPSULE | Freq: Every day | ORAL | 1 refills | Status: DC

## 2016-12-04 NOTE — Assessment & Plan Note (Signed)
S: appears to be in sinus rhythm. Compliant with xarelto A/P: continue current rx- doing well

## 2016-12-04 NOTE — Progress Notes (Signed)
Pre visit review using our clinic review tool, if applicable. No additional management support is needed unless otherwise documented below in the visit note. 

## 2016-12-04 NOTE — Assessment & Plan Note (Signed)
S: well controlled. On no rx. a1c not above 6 in sometime Lab Results  Component Value Date   HGBA1C 5.5 10/20/2016   HGBA1C 5.8 06/06/2016   HGBA1C 5.3 11/22/2015   A/P: doing really well, continue current diet/exercise control

## 2016-12-04 NOTE — Progress Notes (Signed)
Subjective:  Sean Lane is a 83 y.o. year old very pleasant male patient who presents for/with See problem oriented charting ROS- no hypoglycemia, no chest pain. Some right knee pain and stiffness but improving.    Past Medical History-  Patient Active Problem List   Diagnosis Date Noted  . Diabetes ([place]) 12/14/2012    Priority: High  . Atrial flutter ([place]) 10/03/2011    Priority: High  . CAD (coronary artery disease) 12/17/2007    Priority: High  . SINUS BRADYCARDIA 07/20/2010    Priority: Medium  . BPH associated with nocturia 06/08/2009    Priority: Medium  . DIZZINESS 12/15/2008    Priority: Medium  . Hyperlipidemia 04/02/2007    Priority: Medium  . Essential hypertension 04/02/2007    Priority: Medium  . Nephrolithiasis 05/07/2014    Priority: Low  . Gout attack 12/14/2012    Priority: Low  . Hematuria 03/28/2012    Priority: Low  . RBBB 12/28/2008    Priority: Low  . GERD 12/15/2008    Priority: Low  . LOSS, CONDUCTIVE HEARING, COMBINED TYPE 04/16/2007    Priority: Low  . Allergic rhinitis 04/02/2007    Priority: Low  . OA (osteoarthritis) of knee 04/02/2007    Priority: Low  . Thrombocytopenia ([place]) 12/04/2016  . Abnormal nuclear stress test 10/26/2016  . Preoperative cardiovascular examination 10/26/2016  . CAD S/P percutaneous coronary angioplasty 10/26/2016  . Diverticulitis of colon 11/22/2015    Medications- reviewed and updated Current Outpatient Prescriptions  Medication Sig Dispense Refill  . acetaminophen (TYLENOL) 500 MG tablet Take 1,000 mg by mouth every 6 (six) hours as needed for moderate pain.    Marland Kitchen acetaminophen (TYLENOL) 650 MG CR tablet Take 1,300 mg by mouth every 8 (eight) hours as needed for pain.    Marland Kitchen CREAM BASE EX Apply 1 application topically at bedtime. Diabetics' Dry Skin Relief (applied to feet)    . glucose blood (FREESTYLE INSULINX TEST) test strip USE TO CHECK BLOOD SUGAR DAILY AND AS NEEDED (Patient taking differently: 1  each by Other route daily. ) 100 each 4  . Lancets (FREESTYLE) lancets CHECK BLOOD SUGAR ONCE DAILY 100 each 4  . loratadine (CLARITIN) 10 MG tablet Take 10 mg by mouth daily as needed for allergies.     . Naphazoline-Pheniramine (OPCON-A OP) Apply 1 drop to eye daily as needed (allergies).    . nitroGLYCERIN (NITROSTAT) 0.4 MG SL tablet Place 1 tablet (0.4 mg total) under the tongue every 5 (five) minutes as needed for chest pain (for chest pain). 25 tablet 6  . NONFORMULARY OR COMPOUNDED ITEM Apply 1 application topically 3 (three) times daily as needed (for itchy/irritated skin). CLOBETASOL 0.05% MIX WITH CETAPHIL    . oxymetazoline (AFRIN) 0.05 % nasal spray Place 1 spray into both nostrils 2 (two) times daily as needed for congestion.    . dutasteride (AVODART) 0.5 MG capsule Take 1 capsule (0.5 mg total) by mouth daily. 90 capsule 1  . rivaroxaban (XARELTO) 20 MG TABS tablet TAKE 1 TABLET DAILY WITH   SUPPER 90 tablet 1  . rosuvastatin (CRESTOR) 10 MG tablet Take 1 tablet (10 mg total) by mouth daily. 90 tablet 3  . tamsulosin (FLOMAX) 0.4 MG CAPS capsule Take 1 capsule (0.4 mg total) by mouth daily. 90 capsule 1   No current facility-administered medications for this visit.     Objective: BP 130/68   Pulse 72   Temp 97.5 F (36.4 C) (Oral)   Ht  6\' 1"  (1.854 m)   Wt 181 lb 6.4 oz (82.3 kg)   SpO2 95%   BMI 23.93 kg/m  Gen: NAD, resting comfortably CV: RRR no murmurs rubs or gallops Lungs: CTAB no crackles, wheeze, rhonchi Ext: no edema Skin: warm, dry Neuro:  Walks with cane due to recent knee surgery  Assessment/Plan:  Thrombocytopenia ([facility]) S: intermittent thrombocytopenia. Just checked a month ago and no drastic decrease A/P: consider labs at future visit but hold off until also need a1c  Diabetes ([place]) S: well controlled. On no rx. a1c not above 6 in sometime Lab Results  Component Value Date   HGBA1C 5.5 10/20/2016   HGBA1C 5.8 06/06/2016   HGBA1C 5.3 11/22/2015    A/P: doing really well, continue current diet/exercise control  Atrial flutter S: appears to be in sinus rhythm. Compliant with xarelto A/P: continue current rx- doing well   CAD (coronary artery disease) S: compliant with ASA. Crestor 10mg . Nitro available.  s/p PCI to CFX in 2009 with Cypher DES x2. Updated cath 10/26/16 due to false positive stress test- no culprit lesions found. Patient states no chest pain A/P: continue current medications, doing well   Return in about 6 months (around 06/06/2017) for follow up- or sooner if needed.  Return precautions advised.  [name], MD

## 2016-12-04 NOTE — Patient Instructions (Signed)
No changes today  You are doing fantastic after your surgery - really impressed

## 2016-12-04 NOTE — Assessment & Plan Note (Signed)
S: compliant with ASA. Crestor 10mg . Nitro available.  s/p PCI to CFX in 2009 with Cypher DES x2. Updated cath 10/26/16 due to false positive stress test- no culprit lesions found. Patient states no chest pain A/P: continue current medications, doing well

## 2016-12-04 NOTE — Assessment & Plan Note (Signed)
S: intermittent thrombocytopenia. Just checked a month ago and no drastic decrease A/P: consider labs at future visit but hold off until also need a1c

## 2016-12-05 DIAGNOSIS — Z96651 Presence of right artificial knee joint: Secondary | ICD-10-CM | POA: Diagnosis not present

## 2016-12-05 DIAGNOSIS — Z471 Aftercare following joint replacement surgery: Secondary | ICD-10-CM | POA: Diagnosis not present

## 2016-12-05 DIAGNOSIS — E119 Type 2 diabetes mellitus without complications: Secondary | ICD-10-CM | POA: Diagnosis not present

## 2016-12-05 LAB — HM DIABETES EYE EXAM

## 2016-12-06 DIAGNOSIS — M25561 Pain in right knee: Secondary | ICD-10-CM | POA: Diagnosis not present

## 2016-12-11 ENCOUNTER — Encounter: Payer: Self-pay | Admitting: Family Medicine

## 2016-12-12 ENCOUNTER — Other Ambulatory Visit: Payer: Self-pay

## 2016-12-12 MED ORDER — GLUCOSE BLOOD VI STRP: ORAL_STRIP | 4 refills | Status: DC

## 2016-12-12 MED ORDER — FREESTYLE LANCETS MISC: 4 refills | Status: DC

## 2017-01-08 DIAGNOSIS — M546 Pain in thoracic spine: Secondary | ICD-10-CM | POA: Diagnosis not present

## 2017-01-08 DIAGNOSIS — M542 Cervicalgia: Secondary | ICD-10-CM | POA: Diagnosis not present

## 2017-01-08 DIAGNOSIS — M62830 Muscle spasm of back: Secondary | ICD-10-CM | POA: Diagnosis not present

## 2017-01-08 DIAGNOSIS — M9902 Segmental and somatic dysfunction of thoracic region: Secondary | ICD-10-CM | POA: Diagnosis not present

## 2017-01-08 DIAGNOSIS — M9901 Segmental and somatic dysfunction of cervical region: Secondary | ICD-10-CM | POA: Diagnosis not present

## 2017-01-08 DIAGNOSIS — M9900 Segmental and somatic dysfunction of head region: Secondary | ICD-10-CM | POA: Diagnosis not present

## 2017-01-09 DIAGNOSIS — Z96651 Presence of right artificial knee joint: Secondary | ICD-10-CM | POA: Diagnosis not present

## 2017-01-09 DIAGNOSIS — Z471 Aftercare following joint replacement surgery: Secondary | ICD-10-CM | POA: Diagnosis not present

## 2017-01-10 DIAGNOSIS — M546 Pain in thoracic spine: Secondary | ICD-10-CM | POA: Diagnosis not present

## 2017-01-10 DIAGNOSIS — M9902 Segmental and somatic dysfunction of thoracic region: Secondary | ICD-10-CM | POA: Diagnosis not present

## 2017-01-10 DIAGNOSIS — M9901 Segmental and somatic dysfunction of cervical region: Secondary | ICD-10-CM | POA: Diagnosis not present

## 2017-01-10 DIAGNOSIS — M62830 Muscle spasm of back: Secondary | ICD-10-CM | POA: Diagnosis not present

## 2017-01-10 DIAGNOSIS — M9900 Segmental and somatic dysfunction of head region: Secondary | ICD-10-CM | POA: Diagnosis not present

## 2017-01-10 DIAGNOSIS — M542 Cervicalgia: Secondary | ICD-10-CM | POA: Diagnosis not present

## 2017-01-11 DIAGNOSIS — M9900 Segmental and somatic dysfunction of head region: Secondary | ICD-10-CM | POA: Diagnosis not present

## 2017-01-11 DIAGNOSIS — M9901 Segmental and somatic dysfunction of cervical region: Secondary | ICD-10-CM | POA: Diagnosis not present

## 2017-01-11 DIAGNOSIS — M62830 Muscle spasm of back: Secondary | ICD-10-CM | POA: Diagnosis not present

## 2017-01-11 DIAGNOSIS — M542 Cervicalgia: Secondary | ICD-10-CM | POA: Diagnosis not present

## 2017-01-11 DIAGNOSIS — M546 Pain in thoracic spine: Secondary | ICD-10-CM | POA: Diagnosis not present

## 2017-01-11 DIAGNOSIS — M9902 Segmental and somatic dysfunction of thoracic region: Secondary | ICD-10-CM | POA: Diagnosis not present

## 2017-01-15 DIAGNOSIS — M62830 Muscle spasm of back: Secondary | ICD-10-CM | POA: Diagnosis not present

## 2017-01-15 DIAGNOSIS — M542 Cervicalgia: Secondary | ICD-10-CM | POA: Diagnosis not present

## 2017-01-15 DIAGNOSIS — M546 Pain in thoracic spine: Secondary | ICD-10-CM | POA: Diagnosis not present

## 2017-01-15 DIAGNOSIS — M9900 Segmental and somatic dysfunction of head region: Secondary | ICD-10-CM | POA: Diagnosis not present

## 2017-01-15 DIAGNOSIS — M9902 Segmental and somatic dysfunction of thoracic region: Secondary | ICD-10-CM | POA: Diagnosis not present

## 2017-01-15 DIAGNOSIS — M9901 Segmental and somatic dysfunction of cervical region: Secondary | ICD-10-CM | POA: Diagnosis not present

## 2017-01-18 NOTE — Progress Notes (Signed)
Patient ID: Sean Lane, male   DOB: 25-Jan-1928, 83 y.o.   MRN: [id number]   Sean Lane returns today for followup. He is a very pleasant 83 y.o.  man with a history of atrial flutter, hypertension, and sinus bradycardia and CAD . The patient has maintaining sinus rhythm since last February. I had initially seen him in January 15  and he was in atrial flutter with a controlled ventricular response. He spontaneously reverted to sinus rhythm. He was placed on anticoagulation with Coumadin. He denies chest pain or shortness of breath  Changed to  Xarelto end of June 2017  having microhematuria Sees Sean Lane for prostate   He has a history of CAD, s/p PCI to CFX in 2009 with Cypher DES x2, hypertension, hyperlipidemia, right bundle branch block, GERD, BPH. LHC 3/09: Mid RCA 50%, left main 20%, proximal LAD 40-50%, mid LAD 40%, proximal circumflex 80% and an 80% after OM2, left PDA 40%, EF 60%. PCI: Cypher DES x2 to the proximal and mid circumflex   Last exercise treadmill test 1/12: Heart rate increased appropriately, no ischemic EKG changes.   Discussed future need of pacer. Avoid all AV nodal blocking drugs. He exercises regularly and has good chronotropic response  Last echo 2015 normal EF  Study Conclusions  - Left ventricle: The cavity size was normal. Systolic function was normal. The estimated ejection fraction was in the range of 55% to 60%. Wall motion was normal; there were no regional wall motion abnormalities. - Mitral valve: Mild regurgitation. - Left atrium: The atrium was mildly dilated. - Atrial septum: No defect or patent foramen ovale was identified.  Seen 10/2016 for preop  clearance Myovue false positive suggested inferior infarct/ischemia  Cath 10/26/16 films reviewed:   Small, non-dominant RCA: Prox RCA lesion, 70 %stenosed. Mid RCA lesion, 70 %stenosed.  Prox LAD to Mid LAD lesion, 50 %stenosed - calicified/irregular. Mid LAD lesion, 40 %stenosed.  Prox  Cx to Mid Cx overlapping Cypeher DES (from 11/2007), 0 %stenosed.  The left ventricular systolic function is normal. The left ventricular ejection fraction is 55-65% by visual estimate.    Had right unicompartmental arthroplasty with Dr Sean Lane 04/28/28 no complications   ROS: Denies fever, malais, weight loss, blurry vision, decreased visual acuity, cough, sputum, SOB, hemoptysis, pleuritic pain, palpitaitons, heartburn, abdominal pain, melena, lower extremity edema, claudication, or rash.  All other systems reviewed and negative  General: Affect appropriate Elderly white male  HEENT: normal Neck supple with no adenopathy JVP normal no bruits no thyromegaly Lungs clear with no wheezing and good diaphragmatic motion Heart:  S1/S2 no murmur, no rub, gallop or click PMI normal Abdomen: benighn, BS positve, no tenderness, no AAA no bruit.  No HSM or HJR Distal pulses intact with no bruits No edema Neuro non-focal Skin warm and dry No muscular weakness Post right knee surgery with good mobility    Current Outpatient Prescriptions  Medication Sig Dispense Refill  . acetaminophen (TYLENOL) 500 MG tablet Take 1,000 mg by mouth every 6 (six) hours as needed for moderate pain.    Marland Kitchen acetaminophen (TYLENOL) 650 MG CR tablet Take 1,300 mg by mouth every 8 (eight) hours as needed for pain.    Marland Kitchen aspirin EC 81 MG tablet Take 81 mg by mouth daily.    Marland Kitchen CREAM BASE EX Apply 1 application topically at bedtime. Diabetics' Dry Skin Relief (applied to feet)    . dutasteride (AVODART) 0.5 MG capsule Take 1 capsule (0.5 mg total) by mouth daily.  90 capsule 1  . glucose blood (FREESTYLE INSULINX TEST) test strip USE TO CHECK BLOOD SUGAR DAILY AND AS NEEDED 100 each 4  . Lancets (FREESTYLE) lancets CHECK BLOOD SUGAR ONCE DAILY 100 each 4  . loratadine (CLARITIN) 10 MG tablet Take 10 mg by mouth daily as needed for allergies.     . Naphazoline-Pheniramine (OPCON-A OP) Apply 1 drop to eye daily as needed  (allergies).    . nitroGLYCERIN (NITROSTAT) 0.4 MG SL tablet Place 1 tablet (0.4 mg total) under the tongue every 5 (five) minutes as needed for chest pain (for chest pain). 25 tablet 6  . NONFORMULARY OR COMPOUNDED ITEM Apply 1 application topically 3 (three) times daily as needed (for itchy/irritated skin). CLOBETASOL 0.05% MIX WITH CETAPHIL    . Omega-3 Fatty Acids (FISH OIL) 1200 MG CAPS Take 2 capsules by mouth 2 (two) times daily.    Marland Kitchen oxymetazoline (AFRIN) 0.05 % nasal spray Place 1 spray into both nostrils 2 (two) times daily as needed for congestion.    . rivaroxaban (XARELTO) 20 MG TABS tablet TAKE 1 TABLET DAILY WITH   SUPPER 90 tablet 1  . rosuvastatin (CRESTOR) 10 MG tablet Take 1 tablet (10 mg total) by mouth daily. 90 tablet 3  . tamsulosin (FLOMAX) 0.4 MG CAPS capsule Take 1 capsule (0.4 mg total) by mouth daily. 90 capsule 1   No current facility-administered medications for this visit.     Allergies  Hydromorphone hcl and Sulfonamide derivatives  Electrocardiogram:  Sinus arrhythmia RBBB no high grade AV block 04/30/14  05/03/16  SR rate 69 RBBB   Assessment and Plan CAD: patent stents circumflex cath 10/26/16 continue medical Rx   PAF: maint NSR continue xarelto no issues when held for surgery   Anticoagulation: renal function good no bleeding issues continue xarelto  SSS: monitor with no pauses.  Baseline RBBB  ETT with adequate HR response  Observe  HTN Well controlled.  Continue current medications and low sodium Dash type diet.    Chol:  Lab Results  Component Value Date   LDLCALC 54 04/09/2014   Diverticulitis:  Resolved documented diverticulosis on previous colonoscopy  Discussed low fiber diet  Ortho: post right knee surgery 11/01/16 continue PT/OT ambulation much improved   Jenkins Rouge

## 2017-01-22 ENCOUNTER — Ambulatory Visit (INDEPENDENT_AMBULATORY_CARE_PROVIDER_SITE_OTHER): Payer: Medicare Other | Admitting: Cardiovascular Disease

## 2017-01-22 ENCOUNTER — Encounter: Payer: Self-pay | Admitting: Cardiovascular Disease

## 2017-01-22 VITALS — BP 110/52 | HR 67 | Ht 73.0 in | Wt 188.2 lb

## 2017-01-22 DIAGNOSIS — I2510 Atherosclerotic heart disease of native coronary artery without angina pectoris: Secondary | ICD-10-CM

## 2017-01-22 DIAGNOSIS — I4510 Unspecified right bundle-branch block: Secondary | ICD-10-CM

## 2017-01-22 NOTE — Patient Instructions (Signed)

## 2017-03-21 ENCOUNTER — Encounter: Payer: Self-pay | Admitting: Family Medicine

## 2017-03-21 ENCOUNTER — Ambulatory Visit (INDEPENDENT_AMBULATORY_CARE_PROVIDER_SITE_OTHER): Payer: Medicare Other | Admitting: Family Medicine

## 2017-03-21 VITALS — BP 158/72 | HR 98 | Temp 98.0°F | Resp 17 | Ht 73.0 in | Wt 191.0 lb

## 2017-03-21 DIAGNOSIS — I2510 Atherosclerotic heart disease of native coronary artery without angina pectoris: Secondary | ICD-10-CM

## 2017-03-21 DIAGNOSIS — S61411A Laceration without foreign body of right hand, initial encounter: Secondary | ICD-10-CM

## 2017-03-21 NOTE — Patient Instructions (Addendum)
  Keep wound dry over the next 5-7 days to allow the skin to heal back in place.  Return if problems   IF you received an x-ray today, you will receive an invoice from [facility]. Please contact [facility] at [phone] with questions or concerns regarding your invoice.   IF you received labwork today, you will receive an invoice from [facility]. Please contact [facility] at [phone] with questions or concerns regarding your invoice.   Our billing staff will not be able to assist you with questions regarding bills from these companies.  You will be contacted with the lab results as soon as they are available. The fastest way to get your results is to activate your My Chart account. Instructions are located on the last page of this paperwork. If you have not heard from [place] regarding the results in 2 weeks, please contact this office.

## 2017-03-21 NOTE — Progress Notes (Signed)
Patient ID: Sean Lane, male    DOB: [birth year]  Age: 83 y.o. MRN: [id number]  Chief Complaint  Patient presents with  . skin tear on hand    Subjective:   83 year old man who goes and exercises 3 days a week. He bumped his right hand on a piece of exercise equipment, tearing the skin. It wasn't until a few minutes later when he was bleeding everywhere that he realized the extent of his skin tear. They dressed it at home and came in here to get it looked at. He has been pretty healthy. He is had some artificial joints, but now is back to exercising regularly. He does his yard work and some stuff around the house and spends a lot of time on the computer.  Current allergies, medications, problem list, past/family and social histories reviewed.  Objective:  BP (!) 158/72   Pulse 98   Temp 98 F (36.7 C) (Oral)   Resp 17   Ht 6\' 1"  (1.854 m)   Wt 191 lb (86.6 kg)   SpO2 98%   BMI 25.20 kg/m  On the back of his right hand pull low the first second finger web space he has a 2 cm tear of the skin in a little C like tear. The wound is clean. It is just a superficial skin tear. The skin was pulled back and reattached, covering the entirety of the wound. It was glued in place with Dermabond. Patient tolerated the procedure well.  Procedure note: Ceiling of skin with Dermabond  Assessment & Plan:   Assessment: 1. Laceration of right hand, foreign body presence unspecified, initial encounter       Plan: Try and protected over the next week to let it heal up well.  No orders of the defined types were placed in this encounter.   No orders of the defined types were placed in this encounter.        Patient Instructions    Keep wound dry over the next 5-7 days to allow the skin to heal back in place.  Return if problems   IF you received an x-ray today, you will receive an invoice from [facility]. Please contact [facility] at [phone] with  questions or concerns regarding your invoice.   IF you received labwork today, you will receive an invoice from [facility]. Please contact [facility] at [phone] with questions or concerns regarding your invoice.   Our billing staff will not be able to assist you with questions regarding bills from these companies.  You will be contacted with the lab results as soon as they are available. The fastest way to get your results is to activate your My Chart account. Instructions are located on the last page of this paperwork. If you have not heard from [place] regarding the results in 2 weeks, please contact this office.         Return if symptoms worsen or fail to improve.   HOPPER,DAVID, MD 03/21/2017

## 2017-03-22 DIAGNOSIS — L300 Nummular dermatitis: Secondary | ICD-10-CM | POA: Diagnosis not present

## 2017-03-22 DIAGNOSIS — L218 Other seborrheic dermatitis: Secondary | ICD-10-CM | POA: Diagnosis not present

## 2017-04-18 DIAGNOSIS — M542 Cervicalgia: Secondary | ICD-10-CM | POA: Diagnosis not present

## 2017-04-18 DIAGNOSIS — M9902 Segmental and somatic dysfunction of thoracic region: Secondary | ICD-10-CM | POA: Diagnosis not present

## 2017-04-18 DIAGNOSIS — M62830 Muscle spasm of back: Secondary | ICD-10-CM | POA: Diagnosis not present

## 2017-04-18 DIAGNOSIS — M9900 Segmental and somatic dysfunction of head region: Secondary | ICD-10-CM | POA: Diagnosis not present

## 2017-04-18 DIAGNOSIS — M546 Pain in thoracic spine: Secondary | ICD-10-CM | POA: Diagnosis not present

## 2017-04-18 DIAGNOSIS — M9901 Segmental and somatic dysfunction of cervical region: Secondary | ICD-10-CM | POA: Diagnosis not present

## 2017-04-27 ENCOUNTER — Encounter: Payer: Self-pay | Admitting: Podiatry

## 2017-04-27 ENCOUNTER — Ambulatory Visit (INDEPENDENT_AMBULATORY_CARE_PROVIDER_SITE_OTHER): Payer: Medicare Other | Admitting: Podiatry

## 2017-04-27 VITALS — BP 161/57 | HR 62 | Resp 18

## 2017-04-27 DIAGNOSIS — I2510 Atherosclerotic heart disease of native coronary artery without angina pectoris: Secondary | ICD-10-CM | POA: Diagnosis not present

## 2017-04-27 DIAGNOSIS — D689 Coagulation defect, unspecified: Secondary | ICD-10-CM

## 2017-04-27 DIAGNOSIS — B353 Tinea pedis: Secondary | ICD-10-CM | POA: Diagnosis not present

## 2017-04-27 DIAGNOSIS — E1151 Type 2 diabetes mellitus with diabetic peripheral angiopathy without gangrene: Secondary | ICD-10-CM | POA: Diagnosis not present

## 2017-04-27 MED ORDER — KETOCONAZOLE 2 % EX CREA: TOPICAL_CREAM | CUTANEOUS | 0 refills | Status: DC

## 2017-04-27 NOTE — Progress Notes (Signed)
Subjective:    Patient ID: Sean Lane, male    DOB: September 22, 1927, 83 y.o.   MRN: [id number]  HPI   I was using a metal file yesterday and went too deep and I am a diabetic and does drain and is sore and tender and swells some and it is on the left big toe  Endorses DM and on Xarelto.   Review of Systems  HENT: Positive for hearing loss.        Ringing in ears   Genitourinary: Positive for difficulty urinating.  Skin: Positive for rash.  Neurological: Positive for light-headedness.  Hematological: Bruises/bleeds easily.  All other systems reviewed and are negative.   Current Outpatient Prescriptions:  .  acetaminophen (TYLENOL) 500 MG tablet, Take 1,000 mg by mouth every 6 (six) hours as needed for moderate pain., Disp: , Rfl:  .  acetaminophen (TYLENOL) 650 MG CR tablet, Take 1,300 mg by mouth every 8 (eight) hours as needed for pain., Disp: , Rfl:  .  aspirin EC 81 MG tablet, Take 81 mg by mouth daily., Disp: , Rfl:  .  CREAM BASE EX, Apply 1 application topically at bedtime. Diabetics' Dry Skin Relief (applied to feet), Disp: , Rfl:  .  dutasteride (AVODART) 0.5 MG capsule, Take 1 capsule (0.5 mg total) by mouth daily., Disp: 90 capsule, Rfl: 1 .  glucose blood (FREESTYLE INSULINX TEST) test strip, USE TO CHECK BLOOD SUGAR DAILY AND AS NEEDED, Disp: 100 each, Rfl: 4 .  ketoconazole (NIZORAL) 2 % cream, Apply daily to the bottom of feet and toenails but not between the toes., Disp: 30 g, Rfl: 0 .  Lancets (FREESTYLE) lancets, CHECK BLOOD SUGAR ONCE DAILY, Disp: 100 each, Rfl: 4 .  loratadine (CLARITIN) 10 MG tablet, Take 10 mg by mouth daily as needed for allergies. , Disp: , Rfl:  .  Naphazoline-Pheniramine (OPCON-A OP), Apply 1 drop to eye daily as needed (allergies)., Disp: , Rfl:  .  nitroGLYCERIN (NITROSTAT) 0.4 MG SL tablet, Place 1 tablet (0.4 mg total) under the tongue every 5 (five) minutes as needed for chest pain (for chest pain)., Disp: 25 tablet, Rfl: 6 .   NONFORMULARY OR COMPOUNDED ITEM, Apply 1 application topically 3 (three) times daily as needed (for itchy/irritated skin). CLOBETASOL 0.05% MIX WITH CETAPHIL, Disp: , Rfl:  .  Omega-3 Fatty Acids (FISH OIL) 1200 MG CAPS, Take 2 capsules by mouth 2 (two) times daily., Disp: , Rfl:  .  oxymetazoline (AFRIN) 0.05 % nasal spray, Place 1 spray into both nostrils 2 (two) times daily as needed for congestion., Disp: , Rfl:  .  rivaroxaban (XARELTO) 20 MG TABS tablet, TAKE 1 TABLET DAILY WITH   SUPPER, Disp: 90 tablet, Rfl: 1 .  rosuvastatin (CRESTOR) 10 MG tablet, Take 1 tablet (10 mg total) by mouth daily., Disp: 90 tablet, Rfl: 3 .  tamsulosin (FLOMAX) 0.4 MG CAPS capsule, Take 1 capsule (0.4 mg total) by mouth daily., Disp: 90 capsule, Rfl: 1     Objective:   Physical Exam Vitals:   04/27/17 0928  BP: (!) 161/57  Pulse: 62  Resp: 18   General AA&O x3. Normal mood and affect.  Vascular Dorsalis pedis 1/4, posterior tibial pulses non-palp bilat. Brisk capillary refill to all digits. Pedal hair diminished.  Neurologic Epicritic sensation grossly intact.  Dermatologic No open lesions. Interspaces clear of maceration. Xerosis with scaling bilat. Nails elongated and dystrophic x 10. L hallux nail incurvated at the medial aspect. No purulence  or drainage.  Orthopedic: MMT 5/5 in dorsiflexion, plantarflexion, inversion, and eversion. Normal joint ROM without pain or crepitus.       Assessment & Plan:   Ingrowing Nail -Due to decreased pulses, patient taking Xarelto, will avoid permanent procedure. -L great toe anesthetized with 3 cc 1% Lidocaine plain. -Nail debrided in slant back fashion.   DM with Coagulation Defect -Other nails manually debrided with a large nail nipper and filed smooth. -Medically warranted as patient is on Xarelto  Tinea Pedis -Rx Ketoconazole. Instructed on application.   RTC 2 weeks for recheck of L hallux nail.

## 2017-05-01 ENCOUNTER — Telehealth: Payer: Self-pay | Admitting: *Deleted

## 2017-05-01 NOTE — Telephone Encounter (Signed)
Called and left a message for the patient to call me back-he was in the office last week and just wanted to see how he was doing and to call the [place] office at [phone]. Sean Lane

## 2017-05-15 ENCOUNTER — Other Ambulatory Visit: Payer: Self-pay | Admitting: Family Medicine

## 2017-05-16 ENCOUNTER — Ambulatory Visit (INDEPENDENT_AMBULATORY_CARE_PROVIDER_SITE_OTHER): Payer: Medicare Other | Admitting: Podiatry

## 2017-05-16 DIAGNOSIS — B353 Tinea pedis: Secondary | ICD-10-CM | POA: Diagnosis not present

## 2017-05-16 DIAGNOSIS — B351 Tinea unguium: Secondary | ICD-10-CM

## 2017-05-16 DIAGNOSIS — L244 Irritant contact dermatitis due to drugs in contact with skin: Secondary | ICD-10-CM | POA: Diagnosis not present

## 2017-05-16 DIAGNOSIS — D689 Coagulation defect, unspecified: Secondary | ICD-10-CM

## 2017-05-16 DIAGNOSIS — E119 Type 2 diabetes mellitus without complications: Secondary | ICD-10-CM

## 2017-05-16 DIAGNOSIS — S91209S Unspecified open wound of unspecified toe(s) with damage to nail, sequela: Secondary | ICD-10-CM

## 2017-05-20 NOTE — Progress Notes (Addendum)
   Subjective:    Patient ID: Sean Lane, male    DOB: 05/13/1928, 83 y.o.   MRN: [id number]  No chief complaint on file.   HPI 83 y.o. male male presents for f/u L hallux nail injury. Reports the nail is feeling fine without issue.  Has been using antifungal cream and developed a rash to both of his feet.  Also requests care of his fungal toenails today. Endorses DM, takes Xarelto.  Review of Systems     Objective:   Physical Exam There were no vitals filed for this visit. General AA&O x3. Normal mood and affect.  Vascular Dorsalis pedis and posterior tibial pulses  present 1+ bilaterally  Capillary refill normal to all digits. Pedal hair growth diminished.  Neurologic Epicritic sensation present bilaterally.  Dermatologic Patchy vesicular rash to bilateral feet. Normal skin temperature and turgor. L hallux nail site healing well, epithelialized. Hyperkeratotic lesions: none bilaterally. Nails: brittle, onychomycosis, thickening, yellow discoloration.  Orthopedic: No history of amputation. No pain to palpation about the feet.     Assessment & Plan:  L Hallux Nail s/p Traumatic Avulsion -Healing well without issues. -Discussed return precautions.  Tinea Pedis -Improving -D/c antifungal cream d/t contact dermatitis.  DM with Onychomycosis, Coagulation Defect. -Nails x9 debrided as below.  Procedure: Nail Debridement Rationale: Patient meets criteria for routine foot care due to coagulation therapy (Xarelto) Type of Debridement: manual, sharp debridement. Instrumentation: Nail nipper, rotary burr. Number of Nails: 9  Follow up in 3 months for routine foot care.

## 2017-05-29 ENCOUNTER — Other Ambulatory Visit: Payer: Self-pay | Admitting: Family Medicine

## 2017-05-31 DIAGNOSIS — M546 Pain in thoracic spine: Secondary | ICD-10-CM | POA: Diagnosis not present

## 2017-05-31 DIAGNOSIS — M542 Cervicalgia: Secondary | ICD-10-CM | POA: Diagnosis not present

## 2017-05-31 DIAGNOSIS — M9900 Segmental and somatic dysfunction of head region: Secondary | ICD-10-CM | POA: Diagnosis not present

## 2017-05-31 DIAGNOSIS — M9901 Segmental and somatic dysfunction of cervical region: Secondary | ICD-10-CM | POA: Diagnosis not present

## 2017-05-31 DIAGNOSIS — M9902 Segmental and somatic dysfunction of thoracic region: Secondary | ICD-10-CM | POA: Diagnosis not present

## 2017-05-31 DIAGNOSIS — M62830 Muscle spasm of back: Secondary | ICD-10-CM | POA: Diagnosis not present

## 2017-06-04 ENCOUNTER — Ambulatory Visit: Payer: Medicare Other | Admitting: Family Medicine

## 2017-06-04 DIAGNOSIS — M542 Cervicalgia: Secondary | ICD-10-CM | POA: Diagnosis not present

## 2017-06-04 DIAGNOSIS — M546 Pain in thoracic spine: Secondary | ICD-10-CM | POA: Diagnosis not present

## 2017-06-04 DIAGNOSIS — M9902 Segmental and somatic dysfunction of thoracic region: Secondary | ICD-10-CM | POA: Diagnosis not present

## 2017-06-04 DIAGNOSIS — M62830 Muscle spasm of back: Secondary | ICD-10-CM | POA: Diagnosis not present

## 2017-06-04 DIAGNOSIS — M9900 Segmental and somatic dysfunction of head region: Secondary | ICD-10-CM | POA: Diagnosis not present

## 2017-06-04 DIAGNOSIS — M9901 Segmental and somatic dysfunction of cervical region: Secondary | ICD-10-CM | POA: Diagnosis not present

## 2017-06-05 ENCOUNTER — Encounter: Payer: Self-pay | Admitting: Family Medicine

## 2017-06-05 ENCOUNTER — Ambulatory Visit (INDEPENDENT_AMBULATORY_CARE_PROVIDER_SITE_OTHER): Payer: Medicare Other | Admitting: Family Medicine

## 2017-06-05 VITALS — BP 122/68 | HR 58 | Temp 97.9°F | Ht 73.0 in | Wt 192.6 lb

## 2017-06-05 DIAGNOSIS — Z23 Encounter for immunization: Secondary | ICD-10-CM

## 2017-06-05 DIAGNOSIS — E119 Type 2 diabetes mellitus without complications: Secondary | ICD-10-CM | POA: Diagnosis not present

## 2017-06-05 DIAGNOSIS — I4892 Unspecified atrial flutter: Secondary | ICD-10-CM | POA: Diagnosis not present

## 2017-06-05 DIAGNOSIS — D696 Thrombocytopenia, unspecified: Secondary | ICD-10-CM | POA: Diagnosis not present

## 2017-06-05 DIAGNOSIS — I2510 Atherosclerotic heart disease of native coronary artery without angina pectoris: Secondary | ICD-10-CM

## 2017-06-05 DIAGNOSIS — E785 Hyperlipidemia, unspecified: Secondary | ICD-10-CM | POA: Diagnosis not present

## 2017-06-05 LAB — COMPREHENSIVE METABOLIC PANEL
ALBUMIN: 4.3 g/dL (ref 3.5–5.2)
ALK PHOS: 75 U/L (ref 39–117)
ALT: 14 U/L (ref 0–53)
AST: 16 U/L (ref 0–37)
BUN: 15 mg/dL (ref 6–23)
CALCIUM: 9.6 mg/dL (ref 8.4–10.5)
CHLORIDE: 104 meq/L (ref 96–112)
CO2: 31 mEq/L (ref 19–32)
Creatinine, Ser: 0.84 mg/dL (ref 0.40–1.50)
GFR: 91.88 mL/min (ref >60.00)
Glucose, Bld: 103 mg/dL — ABNORMAL HIGH (ref 70–99)
POTASSIUM: 3.9 meq/L (ref 3.5–5.1)
Sodium: 142 mEq/L (ref 135–145)
TOTAL PROTEIN: 6.3 g/dL (ref 6.0–8.3)
Total Bilirubin: 1 mg/dL (ref 0.2–1.2)

## 2017-06-05 LAB — CBC WITH DIFFERENTIAL/PLATELET
BASOS PCT: 0.7 % (ref 0.0–3.0)
Basophils Absolute: 0 10*3/uL (ref 0.0–0.1)
EOS PCT: 1.8 % (ref 0.0–5.0)
Eosinophils Absolute: 0.1 10*3/uL (ref 0.0–0.7)
HEMATOCRIT: 42.9 % (ref 39.0–52.0)
HEMOGLOBIN: 14.8 g/dL (ref 13.0–17.0)
LYMPHS PCT: 31.2 % (ref 12.0–46.0)
Lymphs Abs: 1.3 10*3/uL (ref 0.7–4.0)
MCHC: 34.5 g/dL (ref 30.0–36.0)
MCV: 92 fl (ref 78.0–100.0)
MONOS PCT: 6 % (ref 3.0–12.0)
Monocytes Absolute: 0.2 10*3/uL (ref 0.1–1.0)
Neutro Abs: 2.5 10*3/uL (ref 1.4–7.7)
Neutrophils Relative %: 60.3 % (ref 43.0–77.0)
Platelets: 146 10*3/uL — ABNORMAL LOW (ref 150.0–400.0)
RBC: 4.66 Mil/uL (ref 4.22–5.81)
RDW: 14.6 % (ref 11.5–15.5)
WBC: 4.2 10*3/uL (ref 4.0–10.5)

## 2017-06-05 LAB — LDL CHOLESTEROL, DIRECT: LDL DIRECT: 54 mg/dL

## 2017-06-05 LAB — HEMOGLOBIN A1C: Hgb A1c MFr Bld: 5.9 % (ref 4.6–6.5)

## 2017-06-05 NOTE — Assessment & Plan Note (Signed)
S: well controlled. On no rx. a1c usually below 6 even. Diet/exercise controlled Lab Results  Component Value Date   HGBA1C 5.9 06/05/2017   HGBA1C 5.5 10/20/2016   HGBA1C 5.8 06/06/2016  A/P: a1c once again excellent- continue without meds

## 2017-06-05 NOTE — Patient Instructions (Addendum)
Please stop by lab before you go  Glad you are doing so well. We are going to have to study your blood to figure out how to look so good at 88!

## 2017-06-05 NOTE — Assessment & Plan Note (Signed)
S: compliant with xarelto. Appears to be in sinus rhythm. asymptomatic  A/P: continue current medication

## 2017-06-05 NOTE — Assessment & Plan Note (Signed)
No chest pain. Compliant with aspirin and crestor. Has nitro available but not using.

## 2017-06-05 NOTE — Assessment & Plan Note (Signed)
S: mild thrombocytopenia on labs but stable A/P: update cbc today- once again mild

## 2017-06-05 NOTE — Assessment & Plan Note (Signed)
S: well controlled on crestor 10mg  with LDL <60. No myalgias.   A/P: continue current rx

## 2017-06-05 NOTE — Progress Notes (Signed)
Subjective:  Sean Lane is a 83 y.o. year old very pleasant male patient who presents for/with See problem oriented charting ROS- did have a week of vertigo with sinus congestion- resolved yesterday. No hearing loss. No ringing in the ears above baseline. No chest pain or shortness of breath. No facial or extremity weakness. No slurred words or trouble swallowing. no blurry vision. No paresthesias. No confusion or word finding difficulties.    Past Medical History-  Patient Active Problem List   Diagnosis Date Noted  . Diabetes ([place]) 12/14/2012    Priority: High  . Atrial flutter ([place]) 10/03/2011    Priority: High  . CAD (coronary artery disease) 12/17/2007    Priority: High  . SINUS BRADYCARDIA 07/20/2010    Priority: Medium  . BPH associated with nocturia 06/08/2009    Priority: Medium  . DIZZINESS 12/15/2008    Priority: Medium  . Hyperlipidemia 04/02/2007    Priority: Medium  . Essential hypertension 04/02/2007    Priority: Medium  . Nephrolithiasis 05/07/2014    Priority: Low  . Gout attack 12/14/2012    Priority: Low  . Hematuria 03/28/2012    Priority: Low  . RBBB 12/28/2008    Priority: Low  . GERD 12/15/2008    Priority: Low  . LOSS, CONDUCTIVE HEARING, COMBINED TYPE 04/16/2007    Priority: Low  . Allergic rhinitis 04/02/2007    Priority: Low  . OA (osteoarthritis) of knee 04/02/2007    Priority: Low  . Thrombocytopenia ([place]) 12/04/2016  . Abnormal nuclear stress test 10/26/2016  . Preoperative cardiovascular examination 10/26/2016  . CAD S/P percutaneous coronary angioplasty 10/26/2016  . Diverticulitis of colon 11/22/2015    Medications- reviewed and updated Current Outpatient Prescriptions  Medication Sig Dispense Refill  . acetaminophen (TYLENOL) 500 MG tablet Take 1,000 mg by mouth every 6 (six) hours as needed for moderate pain.    Marland Kitchen acetaminophen (TYLENOL) 650 MG CR tablet Take 1,300 mg by mouth every 8 (eight) hours as needed for pain.    Marland Kitchen  aspirin EC 81 MG tablet Take 81 mg by mouth daily.    Marland Kitchen CREAM BASE EX Apply 1 application topically at bedtime. Diabetics' Dry Skin Relief (applied to feet)    . dutasteride (AVODART) 0.5 MG capsule Take 1 capsule (0.5 mg total) by mouth daily. 90 capsule 1  . glucose blood (FREESTYLE INSULINX TEST) test strip USE TO CHECK BLOOD SUGAR DAILY AND AS NEEDED 100 each 4  . ketoconazole (NIZORAL) 2 % cream Apply daily to the bottom of feet and toenails but not between the toes. 30 g 0  . Lancets (FREESTYLE) lancets CHECK BLOOD SUGAR ONCE DAILY 100 each 4  . loratadine (CLARITIN) 10 MG tablet Take 10 mg by mouth daily as needed for allergies.     . Naphazoline-Pheniramine (OPCON-A OP) Apply 1 drop to eye daily as needed (allergies).    . nitroGLYCERIN (NITROSTAT) 0.4 MG SL tablet Place 1 tablet (0.4 mg total) under the tongue every 5 (five) minutes as needed for chest pain (for chest pain). 25 tablet 6  . NONFORMULARY OR COMPOUNDED ITEM Apply 1 application topically 3 (three) times daily as needed (for itchy/irritated skin). CLOBETASOL 0.05% MIX WITH CETAPHIL    . Omega-3 Fatty Acids (FISH OIL) 1200 MG CAPS Take 2 capsules by mouth 2 (two) times daily.    Marland Kitchen oxymetazoline (AFRIN) 0.05 % nasal spray Place 1 spray into both nostrils 2 (two) times daily as needed for congestion.    Marland Kitchen  rosuvastatin (CRESTOR) 10 MG tablet Take 1 tablet (10 mg total) by mouth daily. 90 tablet 3  . tamsulosin (FLOMAX) 0.4 MG CAPS capsule Take 1 capsule (0.4 mg total) by mouth daily. 90 capsule 1  . XARELTO 20 MG TABS tablet TAKE 1 TABLET DAILY WITH   SUPPER 90 tablet 1   Objective: BP 122/68 (BP Location: Left Arm, Patient Position: Sitting, Cuff Size: Large)   Pulse (!) 58   Temp 97.9 F (36.6 C) (Oral)   Ht 6\' 1"  (1.854 m)   Wt 192 lb 9.6 oz (87.4 kg)   SpO2 96%   BMI 25.41 kg/m  Gen: NAD, resting comfortably, appears younger than stated age CV: RRR no murmurs rubs or gallops Lungs: CTAB no crackles, wheeze,  rhonchi Ext: no edema Skin: warm, dry  Assessment/Plan:  Sounds like BPPV episode a week ago. - see ROS- now resolved  Left hip pain- opposite to knee replacement. More int his back- not in his groin- having a hard time doing cooldown after workup. Going to see PA for Dr. Maureen Ralphs soon.   Diabetes ([place]) S: well controlled. On no rx. a1c usually below 6 even. Diet/exercise controlled Lab Results  Component Value Date   HGBA1C 5.9 06/05/2017   HGBA1C 5.5 10/20/2016   HGBA1C 5.8 06/06/2016  A/P: a1c once again excellent- continue without meds  Thrombocytopenia ([facility]) S: mild thrombocytopenia on labs but stable A/P: update cbc today- once again mild  Atrial flutter S: compliant with xarelto. Appears to be in sinus rhythm. asymptomatic  A/P: continue current medication  CAD (coronary artery disease) No chest pain. Compliant with aspirin and crestor. Has nitro available but not using.   Hyperlipidemia S: well controlled on crestor 10mg  with LDL <60. No myalgias.   A/P: continue current rx   Future Appointments Date Time Provider [place]  08/16/2017 1:30 PM Evelina Bucy, DPM [facility]   Did not discuss follow up. 4-6 months reasonable.   Orders Placed This Encounter  Procedures  . Flu vaccine HIGH DOSE PF  . Hemoglobin A1c    [place]  . CBC with Differential/Platelet  . Comprehensive metabolic panel    [place]  . LDL cholesterol, direct    [place]   Return precautions advised.  [name], MD

## 2017-06-06 DIAGNOSIS — M5136 Other intervertebral disc degeneration, lumbar region: Secondary | ICD-10-CM | POA: Diagnosis not present

## 2017-06-06 DIAGNOSIS — M4696 Unspecified inflammatory spondylopathy, lumbar region: Secondary | ICD-10-CM | POA: Diagnosis not present

## 2017-06-07 DIAGNOSIS — G8929 Other chronic pain: Secondary | ICD-10-CM | POA: Diagnosis not present

## 2017-06-07 DIAGNOSIS — M5442 Lumbago with sciatica, left side: Secondary | ICD-10-CM | POA: Diagnosis not present

## 2017-06-18 DIAGNOSIS — M5442 Lumbago with sciatica, left side: Secondary | ICD-10-CM | POA: Diagnosis not present

## 2017-06-18 DIAGNOSIS — G8929 Other chronic pain: Secondary | ICD-10-CM | POA: Diagnosis not present

## 2017-06-20 DIAGNOSIS — M5442 Lumbago with sciatica, left side: Secondary | ICD-10-CM | POA: Diagnosis not present

## 2017-06-20 DIAGNOSIS — G8929 Other chronic pain: Secondary | ICD-10-CM | POA: Diagnosis not present

## 2017-06-26 DIAGNOSIS — M9900 Segmental and somatic dysfunction of head region: Secondary | ICD-10-CM | POA: Diagnosis not present

## 2017-06-26 DIAGNOSIS — M542 Cervicalgia: Secondary | ICD-10-CM | POA: Diagnosis not present

## 2017-06-26 DIAGNOSIS — M9902 Segmental and somatic dysfunction of thoracic region: Secondary | ICD-10-CM | POA: Diagnosis not present

## 2017-06-26 DIAGNOSIS — M62830 Muscle spasm of back: Secondary | ICD-10-CM | POA: Diagnosis not present

## 2017-06-26 DIAGNOSIS — M546 Pain in thoracic spine: Secondary | ICD-10-CM | POA: Diagnosis not present

## 2017-06-26 DIAGNOSIS — M9901 Segmental and somatic dysfunction of cervical region: Secondary | ICD-10-CM | POA: Diagnosis not present

## 2017-06-27 DIAGNOSIS — M542 Cervicalgia: Secondary | ICD-10-CM | POA: Diagnosis not present

## 2017-06-27 DIAGNOSIS — M62830 Muscle spasm of back: Secondary | ICD-10-CM | POA: Diagnosis not present

## 2017-06-27 DIAGNOSIS — M9901 Segmental and somatic dysfunction of cervical region: Secondary | ICD-10-CM | POA: Diagnosis not present

## 2017-06-27 DIAGNOSIS — M546 Pain in thoracic spine: Secondary | ICD-10-CM | POA: Diagnosis not present

## 2017-06-27 DIAGNOSIS — M9900 Segmental and somatic dysfunction of head region: Secondary | ICD-10-CM | POA: Diagnosis not present

## 2017-06-27 DIAGNOSIS — M9902 Segmental and somatic dysfunction of thoracic region: Secondary | ICD-10-CM | POA: Diagnosis not present

## 2017-06-28 DIAGNOSIS — M62830 Muscle spasm of back: Secondary | ICD-10-CM | POA: Diagnosis not present

## 2017-06-28 DIAGNOSIS — M546 Pain in thoracic spine: Secondary | ICD-10-CM | POA: Diagnosis not present

## 2017-06-28 DIAGNOSIS — M9901 Segmental and somatic dysfunction of cervical region: Secondary | ICD-10-CM | POA: Diagnosis not present

## 2017-06-28 DIAGNOSIS — M542 Cervicalgia: Secondary | ICD-10-CM | POA: Diagnosis not present

## 2017-06-28 DIAGNOSIS — M9902 Segmental and somatic dysfunction of thoracic region: Secondary | ICD-10-CM | POA: Diagnosis not present

## 2017-06-28 DIAGNOSIS — M9900 Segmental and somatic dysfunction of head region: Secondary | ICD-10-CM | POA: Diagnosis not present

## 2017-07-02 DIAGNOSIS — M9901 Segmental and somatic dysfunction of cervical region: Secondary | ICD-10-CM | POA: Diagnosis not present

## 2017-07-02 DIAGNOSIS — M62830 Muscle spasm of back: Secondary | ICD-10-CM | POA: Diagnosis not present

## 2017-07-02 DIAGNOSIS — M9900 Segmental and somatic dysfunction of head region: Secondary | ICD-10-CM | POA: Diagnosis not present

## 2017-07-02 DIAGNOSIS — M546 Pain in thoracic spine: Secondary | ICD-10-CM | POA: Diagnosis not present

## 2017-07-02 DIAGNOSIS — M542 Cervicalgia: Secondary | ICD-10-CM | POA: Diagnosis not present

## 2017-07-02 DIAGNOSIS — M9902 Segmental and somatic dysfunction of thoracic region: Secondary | ICD-10-CM | POA: Diagnosis not present

## 2017-07-05 DIAGNOSIS — M9900 Segmental and somatic dysfunction of head region: Secondary | ICD-10-CM | POA: Diagnosis not present

## 2017-07-05 DIAGNOSIS — M546 Pain in thoracic spine: Secondary | ICD-10-CM | POA: Diagnosis not present

## 2017-07-05 DIAGNOSIS — M9901 Segmental and somatic dysfunction of cervical region: Secondary | ICD-10-CM | POA: Diagnosis not present

## 2017-07-05 DIAGNOSIS — M542 Cervicalgia: Secondary | ICD-10-CM | POA: Diagnosis not present

## 2017-07-05 DIAGNOSIS — M9902 Segmental and somatic dysfunction of thoracic region: Secondary | ICD-10-CM | POA: Diagnosis not present

## 2017-07-05 DIAGNOSIS — M62830 Muscle spasm of back: Secondary | ICD-10-CM | POA: Diagnosis not present

## 2017-07-11 DIAGNOSIS — M62830 Muscle spasm of back: Secondary | ICD-10-CM | POA: Diagnosis not present

## 2017-07-11 DIAGNOSIS — M9900 Segmental and somatic dysfunction of head region: Secondary | ICD-10-CM | POA: Diagnosis not present

## 2017-07-11 DIAGNOSIS — M546 Pain in thoracic spine: Secondary | ICD-10-CM | POA: Diagnosis not present

## 2017-07-11 DIAGNOSIS — M542 Cervicalgia: Secondary | ICD-10-CM | POA: Diagnosis not present

## 2017-07-11 DIAGNOSIS — M9901 Segmental and somatic dysfunction of cervical region: Secondary | ICD-10-CM | POA: Diagnosis not present

## 2017-07-11 DIAGNOSIS — M9902 Segmental and somatic dysfunction of thoracic region: Secondary | ICD-10-CM | POA: Diagnosis not present

## 2017-07-12 DIAGNOSIS — M9902 Segmental and somatic dysfunction of thoracic region: Secondary | ICD-10-CM | POA: Diagnosis not present

## 2017-07-12 DIAGNOSIS — M546 Pain in thoracic spine: Secondary | ICD-10-CM | POA: Diagnosis not present

## 2017-07-12 DIAGNOSIS — M9901 Segmental and somatic dysfunction of cervical region: Secondary | ICD-10-CM | POA: Diagnosis not present

## 2017-07-12 DIAGNOSIS — M62830 Muscle spasm of back: Secondary | ICD-10-CM | POA: Diagnosis not present

## 2017-07-12 DIAGNOSIS — M542 Cervicalgia: Secondary | ICD-10-CM | POA: Diagnosis not present

## 2017-07-12 DIAGNOSIS — M9900 Segmental and somatic dysfunction of head region: Secondary | ICD-10-CM | POA: Diagnosis not present

## 2017-07-16 DIAGNOSIS — M62830 Muscle spasm of back: Secondary | ICD-10-CM | POA: Diagnosis not present

## 2017-07-16 DIAGNOSIS — M9900 Segmental and somatic dysfunction of head region: Secondary | ICD-10-CM | POA: Diagnosis not present

## 2017-07-16 DIAGNOSIS — M542 Cervicalgia: Secondary | ICD-10-CM | POA: Diagnosis not present

## 2017-07-16 DIAGNOSIS — M9902 Segmental and somatic dysfunction of thoracic region: Secondary | ICD-10-CM | POA: Diagnosis not present

## 2017-07-16 DIAGNOSIS — M546 Pain in thoracic spine: Secondary | ICD-10-CM | POA: Diagnosis not present

## 2017-07-16 DIAGNOSIS — M9901 Segmental and somatic dysfunction of cervical region: Secondary | ICD-10-CM | POA: Diagnosis not present

## 2017-07-19 DIAGNOSIS — M9901 Segmental and somatic dysfunction of cervical region: Secondary | ICD-10-CM | POA: Diagnosis not present

## 2017-07-19 DIAGNOSIS — M542 Cervicalgia: Secondary | ICD-10-CM | POA: Diagnosis not present

## 2017-07-19 DIAGNOSIS — M546 Pain in thoracic spine: Secondary | ICD-10-CM | POA: Diagnosis not present

## 2017-07-19 DIAGNOSIS — M9902 Segmental and somatic dysfunction of thoracic region: Secondary | ICD-10-CM | POA: Diagnosis not present

## 2017-07-19 DIAGNOSIS — M9900 Segmental and somatic dysfunction of head region: Secondary | ICD-10-CM | POA: Diagnosis not present

## 2017-07-19 DIAGNOSIS — M62830 Muscle spasm of back: Secondary | ICD-10-CM | POA: Diagnosis not present

## 2017-07-23 DIAGNOSIS — M9902 Segmental and somatic dysfunction of thoracic region: Secondary | ICD-10-CM | POA: Diagnosis not present

## 2017-07-23 DIAGNOSIS — M546 Pain in thoracic spine: Secondary | ICD-10-CM | POA: Diagnosis not present

## 2017-07-23 DIAGNOSIS — M542 Cervicalgia: Secondary | ICD-10-CM | POA: Diagnosis not present

## 2017-07-23 DIAGNOSIS — M9900 Segmental and somatic dysfunction of head region: Secondary | ICD-10-CM | POA: Diagnosis not present

## 2017-07-23 DIAGNOSIS — M62830 Muscle spasm of back: Secondary | ICD-10-CM | POA: Diagnosis not present

## 2017-07-23 DIAGNOSIS — M9901 Segmental and somatic dysfunction of cervical region: Secondary | ICD-10-CM | POA: Diagnosis not present

## 2017-07-30 DIAGNOSIS — M546 Pain in thoracic spine: Secondary | ICD-10-CM | POA: Diagnosis not present

## 2017-07-30 DIAGNOSIS — M9902 Segmental and somatic dysfunction of thoracic region: Secondary | ICD-10-CM | POA: Diagnosis not present

## 2017-07-30 DIAGNOSIS — N200 Calculus of kidney: Secondary | ICD-10-CM | POA: Diagnosis not present

## 2017-07-30 DIAGNOSIS — M62830 Muscle spasm of back: Secondary | ICD-10-CM | POA: Diagnosis not present

## 2017-07-30 DIAGNOSIS — M9901 Segmental and somatic dysfunction of cervical region: Secondary | ICD-10-CM | POA: Diagnosis not present

## 2017-07-30 DIAGNOSIS — N401 Enlarged prostate with lower urinary tract symptoms: Secondary | ICD-10-CM | POA: Diagnosis not present

## 2017-07-30 DIAGNOSIS — R351 Nocturia: Secondary | ICD-10-CM | POA: Diagnosis not present

## 2017-07-30 DIAGNOSIS — M9900 Segmental and somatic dysfunction of head region: Secondary | ICD-10-CM | POA: Diagnosis not present

## 2017-07-30 DIAGNOSIS — M542 Cervicalgia: Secondary | ICD-10-CM | POA: Diagnosis not present

## 2017-07-30 DIAGNOSIS — R310 Gross hematuria: Secondary | ICD-10-CM | POA: Diagnosis not present

## 2017-08-05 ENCOUNTER — Other Ambulatory Visit: Payer: Self-pay | Admitting: Family Medicine

## 2017-08-06 DIAGNOSIS — M9900 Segmental and somatic dysfunction of head region: Secondary | ICD-10-CM | POA: Diagnosis not present

## 2017-08-06 DIAGNOSIS — M62830 Muscle spasm of back: Secondary | ICD-10-CM | POA: Diagnosis not present

## 2017-08-06 DIAGNOSIS — M9902 Segmental and somatic dysfunction of thoracic region: Secondary | ICD-10-CM | POA: Diagnosis not present

## 2017-08-06 DIAGNOSIS — M546 Pain in thoracic spine: Secondary | ICD-10-CM | POA: Diagnosis not present

## 2017-08-06 DIAGNOSIS — M9901 Segmental and somatic dysfunction of cervical region: Secondary | ICD-10-CM | POA: Diagnosis not present

## 2017-08-06 DIAGNOSIS — M542 Cervicalgia: Secondary | ICD-10-CM | POA: Diagnosis not present

## 2017-08-07 ENCOUNTER — Telehealth: Payer: Self-pay | Admitting: Cardiovascular Disease

## 2017-08-07 DIAGNOSIS — I10 Essential (primary) hypertension: Secondary | ICD-10-CM

## 2017-08-07 MED ORDER — LOSARTAN POTASSIUM 25 MG PO TABS: 25.0000 mg | ORAL_TABLET | Freq: Every day | ORAL | 11 refills | Status: DC

## 2017-08-07 NOTE — Telephone Encounter (Signed)
Can start cozaar 25 mg daily f/u BMET 3 weeks

## 2017-08-07 NOTE — Telephone Encounter (Signed)
DPR called to report elevated BP over the last week or so. Last week, the patient had a Urology appointment and his BP was 180's/70s. The MD was "not bothered" by the values, but the patient has been checking his BP ever since.  The patient checks his BP at all times of day and it has been between 150-170/70s. HR = 50s. DPR reports he had previously lost 60 pounds and was able to get off his BP meds. He has gained about 10 pounds back in the last year.  She states he is "super sensitive" to salt and they try to limit sodium already.  He has no other symptoms and is taking meds as directed. They await medication recommendations from Dr. Johnsie Cancel.

## 2017-08-07 NOTE — Telephone Encounter (Signed)
Instructed DPR to START COZAAR 25 mg daily. BMET scheduled 12/17. DPR agrees with treatment plan.

## 2017-08-07 NOTE — Telephone Encounter (Signed)
Pt c/o BP issue: STAT if pt c/o blurred vision, one-sided weakness or slurred speech  1. What are your last 5 BP readings?  175/72  158/72  180/70  2. Are you having any other symptoms (ex. Dizziness, headache, blurred vision, passed out)? no  3. What is your BP issue?  high

## 2017-08-13 DIAGNOSIS — M9900 Segmental and somatic dysfunction of head region: Secondary | ICD-10-CM | POA: Diagnosis not present

## 2017-08-13 DIAGNOSIS — M62830 Muscle spasm of back: Secondary | ICD-10-CM | POA: Diagnosis not present

## 2017-08-13 DIAGNOSIS — M546 Pain in thoracic spine: Secondary | ICD-10-CM | POA: Diagnosis not present

## 2017-08-13 DIAGNOSIS — M9901 Segmental and somatic dysfunction of cervical region: Secondary | ICD-10-CM | POA: Diagnosis not present

## 2017-08-13 DIAGNOSIS — M542 Cervicalgia: Secondary | ICD-10-CM | POA: Diagnosis not present

## 2017-08-13 DIAGNOSIS — M9902 Segmental and somatic dysfunction of thoracic region: Secondary | ICD-10-CM | POA: Diagnosis not present

## 2017-08-16 ENCOUNTER — Ambulatory Visit (INDEPENDENT_AMBULATORY_CARE_PROVIDER_SITE_OTHER): Payer: Medicare Other | Admitting: Podiatry

## 2017-08-16 DIAGNOSIS — D689 Coagulation defect, unspecified: Secondary | ICD-10-CM | POA: Diagnosis not present

## 2017-08-16 DIAGNOSIS — B351 Tinea unguium: Secondary | ICD-10-CM | POA: Diagnosis not present

## 2017-08-27 ENCOUNTER — Other Ambulatory Visit: Payer: Medicare Other | Admitting: *Deleted

## 2017-08-27 DIAGNOSIS — M9901 Segmental and somatic dysfunction of cervical region: Secondary | ICD-10-CM | POA: Diagnosis not present

## 2017-08-27 DIAGNOSIS — M546 Pain in thoracic spine: Secondary | ICD-10-CM | POA: Diagnosis not present

## 2017-08-27 DIAGNOSIS — M62830 Muscle spasm of back: Secondary | ICD-10-CM | POA: Diagnosis not present

## 2017-08-27 DIAGNOSIS — M542 Cervicalgia: Secondary | ICD-10-CM | POA: Diagnosis not present

## 2017-08-27 DIAGNOSIS — I10 Essential (primary) hypertension: Secondary | ICD-10-CM

## 2017-08-27 DIAGNOSIS — M9902 Segmental and somatic dysfunction of thoracic region: Secondary | ICD-10-CM | POA: Diagnosis not present

## 2017-08-27 DIAGNOSIS — M9900 Segmental and somatic dysfunction of head region: Secondary | ICD-10-CM | POA: Diagnosis not present

## 2017-08-28 LAB — BASIC METABOLIC PANEL
BUN/Creatinine Ratio: 22 (ref 10–24)
BUN: 16 mg/dL (ref 8–27)
CALCIUM: 9.8 mg/dL (ref 8.6–10.2)
CHLORIDE: 106 mmol/L (ref 96–106)
CO2: 26 mmol/L (ref 20–29)
Creatinine, Ser: 0.72 mg/dL — ABNORMAL LOW (ref 0.76–1.27)
GFR calc non Af Amer: 84 mL/min/{1.73_m2} (ref >59)
GFR, EST AFRICAN AMERICAN: 97 mL/min/{1.73_m2} (ref >59)
Glucose: 107 mg/dL — ABNORMAL HIGH (ref 65–99)
Potassium: 4.6 mmol/L (ref 3.5–5.2)
Sodium: 145 mmol/L — ABNORMAL HIGH (ref 134–144)

## 2017-09-10 ENCOUNTER — Other Ambulatory Visit: Payer: Self-pay | Admitting: Family Medicine

## 2017-09-10 DIAGNOSIS — M546 Pain in thoracic spine: Secondary | ICD-10-CM | POA: Diagnosis not present

## 2017-09-10 DIAGNOSIS — M9902 Segmental and somatic dysfunction of thoracic region: Secondary | ICD-10-CM | POA: Diagnosis not present

## 2017-09-10 DIAGNOSIS — M9900 Segmental and somatic dysfunction of head region: Secondary | ICD-10-CM | POA: Diagnosis not present

## 2017-09-10 DIAGNOSIS — M542 Cervicalgia: Secondary | ICD-10-CM | POA: Diagnosis not present

## 2017-09-10 DIAGNOSIS — M62830 Muscle spasm of back: Secondary | ICD-10-CM | POA: Diagnosis not present

## 2017-09-10 DIAGNOSIS — M9901 Segmental and somatic dysfunction of cervical region: Secondary | ICD-10-CM | POA: Diagnosis not present
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
## 2017-09-17 NOTE — Progress Notes (Signed)
PCP notes:   Health maintenance: Foot exam: completed today.   Abnormal screenings: None.   Patient concerns: None. Patient states he checks glucose 3 times/week with range 95-105.    Nurse concerns: None.   Next PCP appt: NA.

## 2017-09-17 NOTE — Progress Notes (Signed)
Pre visit review using our clinic review tool, if applicable. No additional management support is needed unless otherwise documented below in the visit note. 

## 2017-09-18 ENCOUNTER — Ambulatory Visit (INDEPENDENT_AMBULATORY_CARE_PROVIDER_SITE_OTHER): Payer: Medicare Other | Admitting: *Deleted

## 2017-09-18 ENCOUNTER — Encounter: Payer: Self-pay | Admitting: *Deleted

## 2017-09-18 ENCOUNTER — Other Ambulatory Visit: Payer: Self-pay | Admitting: Family Medicine

## 2017-09-18 VITALS — BP 132/72 | HR 67 | Resp 16 | Ht 73.0 in | Wt 192.4 lb

## 2017-09-18 DIAGNOSIS — Z0000 Encounter for general adult medical examination without abnormal findings: Secondary | ICD-10-CM

## 2017-09-18 DIAGNOSIS — Z1331 Encounter for screening for depression: Secondary | ICD-10-CM

## 2017-09-18 NOTE — Progress Notes (Signed)
I have reviewed and agree with note, evaluation, plan.   Brentt Fread, MD  

## 2017-09-18 NOTE — Patient Instructions (Addendum)
Sean Lane ,  Bring a copy of your living will and/or healthcare power of attorney to your next office visit.  Thank you for taking time to come for your Medicare Wellness Visit. I appreciate your ongoing commitment to your health goals. Please review the following plan we discussed and let me know if I can assist you in the future.   These are the goals we discussed: Goals    . Weight (lb) < 180 lb (81.6 kg)     Will count calories and carbs; Lost 1000 calories a day 2000 -2200  Will make a plan and get this completed        This is a list of the screening recommended for you and due dates:  Health Maintenance  Topic Date Due  . Complete foot exam   06/06/2017  . Hemoglobin A1C  12/03/2017  . Eye exam for diabetics  12/05/2017  . Tetanus Vaccine  10/24/2021  . Flu Shot  Completed  . Pneumonia vaccines  Completed   Preventive Care for Adults  A healthy lifestyle and preventive care can promote health and wellness. Preventive health guidelines for adults include the following key practices.  . A routine yearly physical is a good way to check with your health care provider about your health and preventive screening. It is a chance to share any concerns and updates on your health and to receive a thorough exam.  . Visit your dentist for a routine exam and preventive care every 6 months. Brush your teeth twice a day and floss once a day. Good oral hygiene prevents tooth decay and gum disease.  . The frequency of eye exams is based on your age, health, family medical history, use  of contact lenses, and other factors. Follow your health care provider's recommendations for frequency of eye exams.  . Eat a healthy diet. Foods like vegetables, fruits, whole grains, low-fat dairy products, and lean protein foods contain the nutrients you need without too many calories. Decrease your intake of foods high in solid fats, added sugars, and salt. Eat the right amount of calories for you. Get  information about a proper diet from your health care provider, if necessary.  . Regular physical exercise is one of the most important things you can do for your health. Most adults should get at least 150 minutes of moderate-intensity exercise (any activity that increases your heart rate and causes you to sweat) each week. In addition, most adults need muscle-strengthening exercises on 2 or more days a week.  Silver Sneakers may be a benefit available to you. To determine eligibility, you may visit the website: www.silversneakers.com or contact program at 240-881-3694 Mon-Fri between 8AM-8PM.   . Maintain a healthy weight. The body mass index (BMI) is a screening tool to identify possible weight problems. It provides an estimate of body fat based on height and weight. Your health care provider can find your BMI and can help you achieve or maintain a healthy weight.   For adults 20 years and older: ? A BMI below 18.5 is considered underweight. ? A BMI of 18.5 to 24.9 is normal. ? A BMI of 25 to 29.9 is considered overweight. ? A BMI of 30 and above is considered obese.   . Maintain normal blood lipids and cholesterol levels by exercising and minimizing your intake of saturated fat. Eat a balanced diet with plenty of fruit and vegetables. Blood tests for lipids and cholesterol should begin at age 64 and be repeated  every 5 years. If your lipid or cholesterol levels are high, you are over 50, or you are at high risk for heart disease, you may need your cholesterol levels checked more frequently. Ongoing high lipid and cholesterol levels should be treated with medicines if diet and exercise are not working.  . If you smoke, find out from your health care provider how to quit. If you do not use tobacco, please do not start.  . If you choose to drink alcohol, please do not consume more than 2 drinks per day. One drink is considered to be 12 ounces (355 mL) of beer, 5 ounces (148 mL) of wine, or 1.5  ounces (44 mL) of liquor.  . If you are 72-52 years old, ask your health care provider if you should take aspirin to prevent strokes.  . Use sunscreen. Apply sunscreen liberally and repeatedly throughout the day. You should seek shade when your shadow is shorter than you. Protect yourself by wearing long sleeves, pants, a wide-brimmed hat, and sunglasses year round, whenever you are outdoors.  . Once a month, do a whole body skin exam, using a mirror to look at the skin on your back. Tell your health care provider of new moles, moles that have irregular borders, moles that are larger than a pencil eraser, or moles that have changed in shape or color.     Sean Lane , Thank you for taking time to come for your Medicare Wellness Visit. I appreciate your ongoing commitment to your health goals. Please review the following plan we discussed and let me know if I can assist you in the future.   These are the goals we discussed: Goals    . Weight (lb) < 180 lb (81.6 kg)     Will count calories and carbs; Lost 1000 calories a day 2000 -2200  Will make a plan and get this completed        This is a list of the screening recommended for you and due dates:  Health Maintenance  Topic Date Due  . Complete foot exam   06/06/2017  . Hemoglobin A1C  12/03/2017  . Eye exam for diabetics  12/05/2017  . Tetanus Vaccine  10/24/2021  . Flu Shot  Completed  . Pneumonia vaccines  Completed   Preventive Care for Adults  A healthy lifestyle and preventive care can promote health and wellness. Preventive health guidelines for adults include the following key practices.  . A routine yearly physical is a good way to check with your health care provider about your health and preventive screening. It is a chance to share any concerns and updates on your health and to receive a thorough exam.  . Visit your dentist for a routine exam and preventive care every 6 months. Brush your teeth twice a day and floss  once a day. Good oral hygiene prevents tooth decay and gum disease.  . The frequency of eye exams is based on your age, health, family medical history, use  of contact lenses, and other factors. Follow your health care provider's recommendations for frequency of eye exams.  . Eat a healthy diet. Foods like vegetables, fruits, whole grains, low-fat dairy products, and lean protein foods contain the nutrients you need without too many calories. Decrease your intake of foods high in solid fats, added sugars, and salt. Eat the right amount of calories for you. Get information about a proper diet from your health care provider, if necessary.  . Regular physical  exercise is one of the most important things you can do for your health. Most adults should get at least 150 minutes of moderate-intensity exercise (any activity that increases your heart rate and causes you to sweat) each week. In addition, most adults need muscle-strengthening exercises on 2 or more days a week.  Silver Sneakers may be a benefit available to you. To determine eligibility, you may visit the website: www.silversneakers.com or contact program at 240-439-9866 Mon-Fri between 8AM-8PM.   . Maintain a healthy weight. The body mass index (BMI) is a screening tool to identify possible weight problems. It provides an estimate of body fat based on height and weight. Your health care provider can find your BMI and can help you achieve or maintain a healthy weight.   For adults 20 years and older: ? A BMI below 18.5 is considered underweight. ? A BMI of 18.5 to 24.9 is normal. ? A BMI of 25 to 29.9 is considered overweight. ? A BMI of 30 and above is considered obese.   . Maintain normal blood lipids and cholesterol levels by exercising and minimizing your intake of saturated fat. Eat a balanced diet with plenty of fruit and vegetables. Blood tests for lipids and cholesterol should begin at age 9 and be repeated every 5 years. If your  lipid or cholesterol levels are high, you are over 50, or you are at high risk for heart disease, you may need your cholesterol levels checked more frequently. Ongoing high lipid and cholesterol levels should be treated with medicines if diet and exercise are not working.  . If you smoke, find out from your health care provider how to quit. If you do not use tobacco, please do not start.  . If you choose to drink alcohol, please do not consume more than 2 drinks per day. One drink is considered to be 12 ounces (355 mL) of beer, 5 ounces (148 mL) of wine, or 1.5 ounces (44 mL) of liquor.  . If you are 51-42 years old, ask your health care provider if you should take aspirin to prevent strokes.  . Use sunscreen. Apply sunscreen liberally and repeatedly throughout the day. You should seek shade when your shadow is shorter than you. Protect yourself by wearing long sleeves, pants, a wide-brimmed hat, and sunglasses year round, whenever you are outdoors.  . Once a month, do a whole body skin exam, using a mirror to look at the skin on your back. Tell your health care provider of new moles, moles that have irregular borders, moles that are larger than a pencil eraser, or moles that have changed in shape or color.

## 2017-09-30 NOTE — Progress Notes (Signed)
  Subjective:  Patient ID: Sean Lane, male    DOB: [birth year],  MRN: [id number]  No chief complaint on file.  84 y.o. male returns for diabetic foot care.  Takes Xarelto for anticoagulation.  Requesting care of his toenails today  Objective:  There were no vitals filed for this visit. General AA&O x3. Normal mood and affect.  Vascular Dorsalis pedis pulses present 1+ bilaterally  Posterior tibial pulses present 1+ bilaterally  Capillary refill normal to all digits. Pedal hair growth diminished.  Neurologic Epicritic sensation present bilaterally.  Dermatologic No open lesions. Interspaces clear of maceration.  Normal skin temperature and turgor. Hyperkeratotic lesions: none bilaterally. Nails: brittle onychomycosis thickening yellow discoloration elongation  Orthopedic: No history of amputation. MMT 5/5 in dorsiflexion, plantarflexion, inversion, and eversion. Normal lower extremity joint ROM without pain or crepitus.   Assessment & Plan:  Patient was evaluated and treated and all questions answered.  Diabetes with coagulation defect, Onychomycosis -Educated on diabetic footcare. Diabetic risk level 1 -At risk foot care provided as below.  Procedure: Nail Debridement Rationale: Patient meets criteria for routine foot care due to coagulation defect Type of Debridement: manual, sharp debridement. Instrumentation: Nail nipper, rotary burr. Number of Nails: 10  Return in about 3 months (around 11/14/2017) for Diabetic Foot Care.

## 2017-10-03 LAB — PSA: PSA: 1.27

## 2017-10-18 ENCOUNTER — Encounter: Payer: Self-pay | Admitting: Podiatry

## 2017-10-18 ENCOUNTER — Ambulatory Visit (INDEPENDENT_AMBULATORY_CARE_PROVIDER_SITE_OTHER): Payer: Medicare Other | Admitting: Podiatry

## 2017-10-18 DIAGNOSIS — D689 Coagulation defect, unspecified: Secondary | ICD-10-CM

## 2017-10-18 DIAGNOSIS — B351 Tinea unguium: Secondary | ICD-10-CM

## 2017-10-18 DIAGNOSIS — E1151 Type 2 diabetes mellitus with diabetic peripheral angiopathy without gangrene: Secondary | ICD-10-CM | POA: Diagnosis not present

## 2017-10-29 NOTE — Progress Notes (Signed)
  Subjective:  Patient ID: Sean Lane, male    DOB: 1928/04/17,  MRN: [id number]  No chief complaint on file.  84 y.o. male returns for diabetic foot care.  Takes Xarelto for anticoagulation  Objective:   General AA&O x3. Normal mood and affect.  Vascular Dorsalis pedis pulses present 1+ bilaterally  Posterior tibial pulses 1+ bilaterally  Capillary refill normal to all digits. Pedal hair growth normal.  Neurologic Epicritic sensation present bilaterally. Protective sensation with 5.07 monofilament  present bilaterally. Vibratory sensation present bilaterally.  Dermatologic No open lesions. Interspaces clear of maceration.  Normal skin temperature and turgor. Hyperkeratotic lesions: None bilaterally. Nails: brittle, onychomycosis, thickening, elongation  Orthopedic: No history of amputation. MMT 5/5 in dorsiflexion, plantarflexion, inversion, and eversion. Normal lower extremity joint ROM without pain or crepitus.   Assessment & Plan:  Patient was evaluated and treated and all questions answered.  Diabetes with coagulation defect, Onychomycosis -Educated on diabetic footcare. Diabetic risk level 1 -At risk foot care provided as below.  Procedure: Nail Debridement Rationale: Patient meets criteria for routine foot care due to coagulation defect Type of Debridement: manual, sharp debridement. Instrumentation: Nail nipper, rotary burr. Number of Nails: 10  Return in about 9 weeks (around 12/20/2017).

## 2017-12-03 DIAGNOSIS — M9901 Segmental and somatic dysfunction of cervical region: Secondary | ICD-10-CM | POA: Diagnosis not present

## 2017-12-03 DIAGNOSIS — M62830 Muscle spasm of back: Secondary | ICD-10-CM | POA: Diagnosis not present

## 2017-12-03 DIAGNOSIS — M542 Cervicalgia: Secondary | ICD-10-CM | POA: Diagnosis not present

## 2017-12-03 DIAGNOSIS — M546 Pain in thoracic spine: Secondary | ICD-10-CM | POA: Diagnosis not present

## 2017-12-03 DIAGNOSIS — M9900 Segmental and somatic dysfunction of head region: Secondary | ICD-10-CM | POA: Diagnosis not present

## 2017-12-03 DIAGNOSIS — M9902 Segmental and somatic dysfunction of thoracic region: Secondary | ICD-10-CM | POA: Diagnosis not present

## 2017-12-04 DIAGNOSIS — M9901 Segmental and somatic dysfunction of cervical region: Secondary | ICD-10-CM | POA: Diagnosis not present

## 2017-12-04 DIAGNOSIS — M9900 Segmental and somatic dysfunction of head region: Secondary | ICD-10-CM | POA: Diagnosis not present

## 2017-12-04 DIAGNOSIS — M9902 Segmental and somatic dysfunction of thoracic region: Secondary | ICD-10-CM | POA: Diagnosis not present

## 2017-12-04 DIAGNOSIS — M62830 Muscle spasm of back: Secondary | ICD-10-CM | POA: Diagnosis not present

## 2017-12-04 DIAGNOSIS — M546 Pain in thoracic spine: Secondary | ICD-10-CM | POA: Diagnosis not present

## 2017-12-04 DIAGNOSIS — M542 Cervicalgia: Secondary | ICD-10-CM | POA: Diagnosis not present

## 2017-12-05 ENCOUNTER — Encounter: Payer: Self-pay | Admitting: Family Medicine

## 2017-12-05 ENCOUNTER — Ambulatory Visit (INDEPENDENT_AMBULATORY_CARE_PROVIDER_SITE_OTHER): Payer: Medicare Other | Admitting: Family Medicine

## 2017-12-05 VITALS — BP 126/82 | HR 64 | Temp 97.4°F | Ht 73.0 in | Wt 193.4 lb

## 2017-12-05 DIAGNOSIS — E785 Hyperlipidemia, unspecified: Secondary | ICD-10-CM

## 2017-12-05 DIAGNOSIS — M9902 Segmental and somatic dysfunction of thoracic region: Secondary | ICD-10-CM | POA: Diagnosis not present

## 2017-12-05 DIAGNOSIS — I10 Essential (primary) hypertension: Secondary | ICD-10-CM

## 2017-12-05 DIAGNOSIS — I2510 Atherosclerotic heart disease of native coronary artery without angina pectoris: Secondary | ICD-10-CM

## 2017-12-05 DIAGNOSIS — E119 Type 2 diabetes mellitus without complications: Secondary | ICD-10-CM

## 2017-12-05 DIAGNOSIS — M9900 Segmental and somatic dysfunction of head region: Secondary | ICD-10-CM | POA: Diagnosis not present

## 2017-12-05 DIAGNOSIS — I4892 Unspecified atrial flutter: Secondary | ICD-10-CM | POA: Diagnosis not present

## 2017-12-05 DIAGNOSIS — M62830 Muscle spasm of back: Secondary | ICD-10-CM | POA: Diagnosis not present

## 2017-12-05 DIAGNOSIS — D696 Thrombocytopenia, unspecified: Secondary | ICD-10-CM | POA: Diagnosis not present

## 2017-12-05 DIAGNOSIS — M546 Pain in thoracic spine: Secondary | ICD-10-CM | POA: Diagnosis not present

## 2017-12-05 DIAGNOSIS — M542 Cervicalgia: Secondary | ICD-10-CM | POA: Diagnosis not present

## 2017-12-05 DIAGNOSIS — M9901 Segmental and somatic dysfunction of cervical region: Secondary | ICD-10-CM | POA: Diagnosis not present

## 2017-12-05 LAB — CBC
HEMATOCRIT: 43.4 % (ref 39.0–52.0)
HEMOGLOBIN: 15.3 g/dL (ref 13.0–17.0)
MCHC: 35.2 g/dL (ref 30.0–36.0)
MCV: 91.2 fl (ref 78.0–100.0)
PLATELETS: 161 10*3/uL (ref 150.0–400.0)
RBC: 4.75 Mil/uL (ref 4.22–5.81)
RDW: 13.8 % (ref 11.5–15.5)
WBC: 4.8 10*3/uL (ref 4.0–10.5)

## 2017-12-05 LAB — COMPREHENSIVE METABOLIC PANEL
ALK PHOS: 73 U/L (ref 39–117)
ALT: 14 U/L (ref 0–53)
AST: 19 U/L (ref 0–37)
Albumin: 4.2 g/dL (ref 3.5–5.2)
BILIRUBIN TOTAL: 1 mg/dL (ref 0.2–1.2)
BUN: 18 mg/dL (ref 6–23)
CALCIUM: 9.9 mg/dL (ref 8.4–10.5)
CO2: 29 meq/L (ref 19–32)
Chloride: 104 mEq/L (ref 96–112)
Creatinine, Ser: 0.9 mg/dL (ref 0.40–1.50)
GFR: 84.75 mL/min (ref >60.00)
GLUCOSE: 132 mg/dL — AB (ref 70–99)
POTASSIUM: 4.3 meq/L (ref 3.5–5.1)
Sodium: 140 mEq/L (ref 135–145)
Total Protein: 7.1 g/dL (ref 6.0–8.3)

## 2017-12-05 LAB — HEMOGLOBIN A1C: Hgb A1c MFr Bld: 5.8 % (ref 4.6–6.5)

## 2017-12-05 NOTE — Assessment & Plan Note (Addendum)
S: chest pain free- has nitroglycerin but not having to use. Compliant with aspirin and crestor. Last LDL great at 54. Stent over 10 years ago.   A/P: if fasting next visit will update fulll lipids- if not will do LDL

## 2017-12-05 NOTE — Patient Instructions (Signed)
No changes today  Stop by lab before you go

## 2017-12-05 NOTE — Assessment & Plan Note (Signed)
S: mild thrombocytopenia on labs A/P: update cbc today- has bene stable and is very mild

## 2017-12-05 NOTE — Assessment & Plan Note (Signed)
S: xarelto for anticoagulation. No rate control. Appears to be in sinus at times (though obviously can only confirm by EKG) A/P: continue current medications

## 2017-12-05 NOTE — Assessment & Plan Note (Signed)
S: controlled on losartan 25mg  BP Readings from Last 3 Encounters:  12/05/17 126/82  09/18/17 132/72  06/05/17 122/68  A/P: We discussed blood pressure goal of <140/90. Continue current meds:  At goal

## 2017-12-05 NOTE — Assessment & Plan Note (Signed)
S: diet controlled. Eye exam scheduled for tomorrow. Blood sugars running 95 to 106.  Lab Results  Component Value Date   HGBA1C 5.9 06/05/2017   HGBA1C 5.5 10/20/2016   HGBA1C 5.8 06/06/2016   A/P: update a1c

## 2017-12-05 NOTE — Progress Notes (Signed)
Subjective:  Sean Lane is a 84 y.o. year old very pleasant male patient who presents for/with See problem oriented charting ROS- right shoulder pain. No chest pain. No edema. No shortness of breath.    Past Medical History-  Patient Active Problem List   Diagnosis Date Noted  . Diabetes ([place]) 12/14/2012    Priority: High  . Atrial flutter ([place]) 10/03/2011    Priority: High  . CAD (coronary artery disease) 12/17/2007    Priority: High  . SINUS BRADYCARDIA 07/20/2010    Priority: Medium  . BPH associated with nocturia 06/08/2009    Priority: Medium  . DIZZINESS 12/15/2008    Priority: Medium  . Hyperlipidemia 04/02/2007    Priority: Medium  . Essential hypertension 04/02/2007    Priority: Medium  . Nephrolithiasis 05/07/2014    Priority: Low  . Gout attack 12/14/2012    Priority: Low  . Hematuria 03/28/2012    Priority: Low  . RBBB 12/28/2008    Priority: Low  . GERD 12/15/2008    Priority: Low  . LOSS, CONDUCTIVE HEARING, COMBINED TYPE 04/16/2007    Priority: Low  . Allergic rhinitis 04/02/2007    Priority: Low  . OA (osteoarthritis) of knee 04/02/2007    Priority: Low  . Thrombocytopenia ([place]) 12/04/2016  . Abnormal nuclear stress test 10/26/2016  . Preoperative cardiovascular examination 10/26/2016  . CAD S/P percutaneous coronary angioplasty 10/26/2016  . Diverticulitis of colon 11/22/2015    Medications- reviewed and updated Current Outpatient Medications  Medication Sig Dispense Refill  . acetaminophen (TYLENOL) 650 MG CR tablet Take 1,300 mg by mouth every 8 (eight) hours as needed for pain.    Marland Kitchen aspirin EC 81 MG tablet Take 81 mg by mouth daily.    Marland Kitchen CREAM BASE EX Apply 1 application topically at bedtime. Diabetics' Dry Skin Relief (applied to feet)    . dutasteride (AVODART) 0.5 MG capsule Take 1 capsule (0.5 mg total) by mouth daily. 90 capsule 1  . glucose blood (FREESTYLE INSULINX TEST) test strip USE TO CHECK BLOOD SUGAR DAILY AND AS NEEDED 100  each 4  . Lancets (FREESTYLE) lancets CHECK BLOOD SUGAR ONCE DAILY 100 each 4  . losartan (COZAAR) 25 MG tablet Take 1 tablet (25 mg total) by mouth daily. 30 tablet 11  . Naphazoline-Pheniramine (OPCON-A OP) Apply 1 drop to eye daily as needed (allergies).    . nitroGLYCERIN (NITROSTAT) 0.4 MG SL tablet Place 1 tablet (0.4 mg total) under the tongue every 5 (five) minutes as needed for chest pain (for chest pain). 25 tablet 6  . NONFORMULARY OR COMPOUNDED ITEM Apply 1 application topically 3 (three) times daily as needed (for itchy/irritated skin). CLOBETASOL 0.05% MIX WITH CETAPHIL    . Omega-3 Fatty Acids (FISH OIL) 1200 MG CAPS Take 2 capsules by mouth 2 (two) times daily.    Marland Kitchen oxymetazoline (AFRIN) 0.05 % nasal spray Place 1 spray into both nostrils 2 (two) times daily as needed for congestion.    . rosuvastatin (CRESTOR) 10 MG tablet Take 1 tablet (10 mg total) by mouth daily. 90 tablet 3  . tamsulosin (FLOMAX) 0.4 MG CAPS capsule TAKE 1 CAPSULE DAILY 90 capsule 1  . XARELTO 20 MG TABS tablet TAKE 1 TABLET DAILY WITH   SUPPER 90 tablet 1   No current facility-administered medications for this visit.     Objective: BP 126/82 (BP Location: Right Arm, Patient Position: Sitting, Cuff Size: Normal)   Pulse 64   Temp (!) 97.4 F (  36.3 C) (Oral)   Ht 6\' 1"  (1.854 m)   Wt 193 lb 6.4 oz (87.7 kg)   SpO2 95%   BMI 25.52 kg/m  Gen: NAD, resting comfortably CV: RRR no murmurs rubs or gallops Lungs: CTAB no crackles, wheeze, rhonchi Abdomen: soft/nontender/nondistended/normal bowel sounds. Healthy weight Ext: no edema Skin: warm, dry  Diabetic Foot Exam - Simple   Simple Foot Form Diabetic Foot exam was performed with the following findings:  Yes 12/05/2017 10:56 AM  Visual Inspection No deformities, no ulcerations, no other skin breakdown bilaterally:  Yes Sensation Testing Intact to touch and monofilament testing bilaterally:  Yes Pulse Check Posterior Tibialis and Dorsalis pulse  intact bilaterally:  Yes Comments    Assessment/Plan:  Other issues 1. Some right shoulder pain after doing some rows- improving slowly with chiropractor help. Dr. Aundria Mems 2. Dr. Risa Grill has been aware of large kidney stone- not moving but apparently can cause bleeding if he is really active  Essential hypertension S: controlled on losartan 25mg  BP Readings from Last 3 Encounters:  12/05/17 126/82  09/18/17 132/72  06/05/17 122/68  A/P: We discussed blood pressure goal of <140/90. Continue current meds:  At goal  Atrial flutter S: xarelto for anticoagulation. No rate control. Appears to be in sinus at times (though obviously can only confirm by EKG) A/P: continue current medications  Diabetes ([place]) S: diet controlled. Eye exam scheduled for tomorrow. Blood sugars running 95 to 106.  Lab Results  Component Value Date   HGBA1C 5.9 06/05/2017   HGBA1C 5.5 10/20/2016   HGBA1C 5.8 06/06/2016   A/P: update a1c  Thrombocytopenia ([facility]) S: mild thrombocytopenia on labs A/P: update cbc today- has bene stable and is very mild  CAD (coronary artery disease) S: chest pain free- has nitroglycerin but not having to use. Compliant with aspirin and crestor. Last LDL great at 54. Stent over 10 years ago.   A/P: if fasting next visit will update fulll lipids- if not will do LDL   Future Appointments  Date Time Provider [place]  12/20/2017  3:15 PM Evelina Bucy, DPM [facility]  06/07/2018 10:00 AM [name], MD [facility]  09/19/2018  1:00 PM Williemae Area, RN [facility]   Return in about 6 months (around 06/07/2018).  Lab/Order associations: Hyperlipidemia, unspecified hyperlipidemia type - Plan: CBC, Comprehensive metabolic panel  Essential hypertension - Plan: CBC, Comprehensive metabolic panel  Type 2 diabetes mellitus without complication, without long-term current use of insulin ([place]) - Plan: Hemoglobin A1c  Return precautions  advised.  [name], MD

## 2017-12-06 ENCOUNTER — Other Ambulatory Visit: Payer: Self-pay | Admitting: Family Medicine

## 2017-12-06 ENCOUNTER — Other Ambulatory Visit: Payer: Self-pay

## 2017-12-06 ENCOUNTER — Telehealth: Payer: Self-pay

## 2017-12-06 DIAGNOSIS — M542 Cervicalgia: Secondary | ICD-10-CM | POA: Diagnosis not present

## 2017-12-06 DIAGNOSIS — E119 Type 2 diabetes mellitus without complications: Secondary | ICD-10-CM | POA: Diagnosis not present

## 2017-12-06 DIAGNOSIS — M546 Pain in thoracic spine: Secondary | ICD-10-CM | POA: Diagnosis not present

## 2017-12-06 DIAGNOSIS — M9902 Segmental and somatic dysfunction of thoracic region: Secondary | ICD-10-CM | POA: Diagnosis not present

## 2017-12-06 DIAGNOSIS — M9900 Segmental and somatic dysfunction of head region: Secondary | ICD-10-CM | POA: Diagnosis not present

## 2017-12-06 DIAGNOSIS — M9901 Segmental and somatic dysfunction of cervical region: Secondary | ICD-10-CM | POA: Diagnosis not present

## 2017-12-06 DIAGNOSIS — M62830 Muscle spasm of back: Secondary | ICD-10-CM | POA: Diagnosis not present

## 2017-12-06 LAB — HM DIABETES EYE EXAM

## 2017-12-06 NOTE — Telephone Encounter (Signed)
Call patient and spoke to patient's wife. Patient was napping. I asked her to have him call me back so I could give lab results. She asked was it the same as the MyChart results. I told her yes. Patient has checked his MyChart lab results and are aware of them. Patient's wife verbalized understanding.

## 2017-12-07 ENCOUNTER — Encounter: Payer: Self-pay | Admitting: Family Medicine

## 2017-12-10 DIAGNOSIS — M542 Cervicalgia: Secondary | ICD-10-CM | POA: Diagnosis not present

## 2017-12-10 DIAGNOSIS — M9900 Segmental and somatic dysfunction of head region: Secondary | ICD-10-CM | POA: Diagnosis not present

## 2017-12-10 DIAGNOSIS — M62830 Muscle spasm of back: Secondary | ICD-10-CM | POA: Diagnosis not present

## 2017-12-10 DIAGNOSIS — M9902 Segmental and somatic dysfunction of thoracic region: Secondary | ICD-10-CM | POA: Diagnosis not present

## 2017-12-10 DIAGNOSIS — M546 Pain in thoracic spine: Secondary | ICD-10-CM | POA: Diagnosis not present

## 2017-12-10 DIAGNOSIS — M9901 Segmental and somatic dysfunction of cervical region: Secondary | ICD-10-CM | POA: Diagnosis not present

## 2017-12-20 ENCOUNTER — Ambulatory Visit (INDEPENDENT_AMBULATORY_CARE_PROVIDER_SITE_OTHER): Payer: Medicare Other | Admitting: Podiatry

## 2017-12-20 DIAGNOSIS — B351 Tinea unguium: Secondary | ICD-10-CM | POA: Diagnosis not present

## 2017-12-20 DIAGNOSIS — D689 Coagulation defect, unspecified: Secondary | ICD-10-CM | POA: Diagnosis not present

## 2017-12-31 ENCOUNTER — Other Ambulatory Visit: Payer: Self-pay | Admitting: Family Medicine

## 2018-01-07 NOTE — Progress Notes (Signed)
  Subjective:  Patient ID: Sean Lane, male    DOB: 04/15/1928,  MRN: [id number]  Chief Complaint  Patient presents with  . nail trim    B/L debridement   84 y.o. male returns for diabetic foot care.  Takes Xarelto for anticoagulation. No new issues.  Objective:   General AA&O x3. Normal mood and affect.  Vascular Dorsalis pedis pulses present 1+ bilaterally  Posterior tibial pulses 1+ bilaterally  Capillary refill normal to all digits. Pedal hair growth normal.  Neurologic Epicritic sensation present bilaterally. Protective sensation with 5.07 monofilament  present bilaterally. Vibratory sensation present bilaterally.  Dermatologic No open lesions. Interspaces clear of maceration.  Normal skin temperature and turgor. Hyperkeratotic lesions: None bilaterally. Nails: brittle, onychomycosis, thickening, elongation  Orthopedic: No history of amputation. MMT 5/5 in dorsiflexion, plantarflexion, inversion, and eversion. Normal lower extremity joint ROM without pain or crepitus.   Assessment & Plan:  Patient was evaluated and treated and all questions answered.  Diabetes with coagulation defect, Onychomycosis -Educated on diabetic footcare. Diabetic risk level 1 -At risk foot care provided as below.  Procedure: Nail Debridement Rationale: Patient meets criteria for routine foot care due to coagulation defect Type of Debridement: manual, sharp debridement. Instrumentation: Nail nipper, rotary burr. Number of Nails: 10  Return in about 2 months (around 02/21/2018) for Routine Foot Care - on Anticoagulation.

## 2018-01-10 ENCOUNTER — Encounter: Payer: Self-pay | Admitting: Family Medicine

## 2018-01-11 ENCOUNTER — Encounter: Payer: Self-pay | Admitting: Family Medicine

## 2018-01-11 ENCOUNTER — Ambulatory Visit (INDEPENDENT_AMBULATORY_CARE_PROVIDER_SITE_OTHER): Payer: Medicare Other

## 2018-01-11 ENCOUNTER — Ambulatory Visit (INDEPENDENT_AMBULATORY_CARE_PROVIDER_SITE_OTHER): Payer: Medicare Other | Admitting: Family Medicine

## 2018-01-11 VITALS — BP 144/72 | HR 53 | Temp 97.8°F | Ht 73.0 in | Wt 193.4 lb

## 2018-01-11 DIAGNOSIS — M542 Cervicalgia: Secondary | ICD-10-CM

## 2018-01-11 DIAGNOSIS — I10 Essential (primary) hypertension: Secondary | ICD-10-CM | POA: Diagnosis not present

## 2018-01-11 DIAGNOSIS — M546 Pain in thoracic spine: Secondary | ICD-10-CM

## 2018-01-11 DIAGNOSIS — G8929 Other chronic pain: Secondary | ICD-10-CM

## 2018-01-11 DIAGNOSIS — M4184 Other forms of scoliosis, thoracic region: Secondary | ICD-10-CM | POA: Diagnosis not present

## 2018-01-11 DIAGNOSIS — M5030 Other cervical disc degeneration, unspecified cervical region: Secondary | ICD-10-CM | POA: Diagnosis not present

## 2018-01-11 DIAGNOSIS — M5134 Other intervertebral disc degeneration, thoracic region: Secondary | ICD-10-CM | POA: Diagnosis not present

## 2018-01-11 DIAGNOSIS — I2510 Atherosclerotic heart disease of native coronary artery without angina pectoris: Secondary | ICD-10-CM

## 2018-01-11 IMAGING — DX DG CERVICAL SPINE COMPLETE 4+V
6 series · 6 of 6 positions shown · non-contrast
Comparison: [DATE] cervical spine radiographs

CLINICAL DATA: Neck pain for 6-8 months.  No reported injury.

EXAM:
CERVICAL SPINE - COMPLETE 4+ VIEW

[cervical spine lat (1 of 2)]
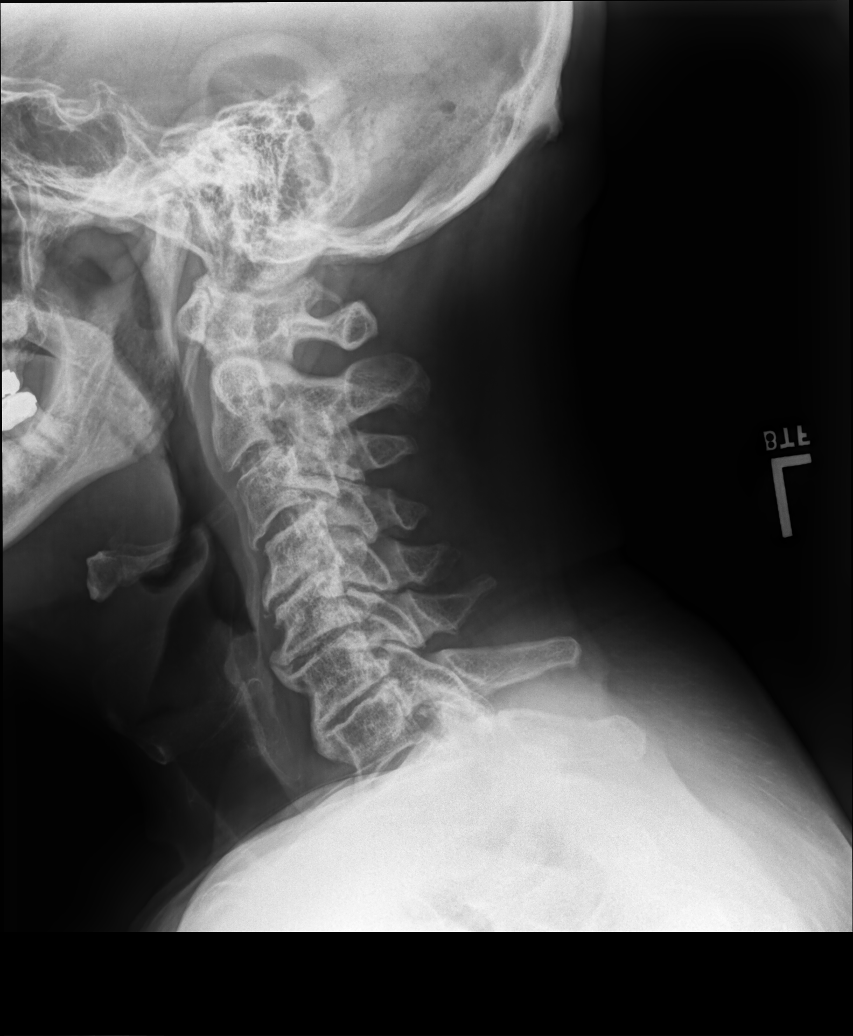

[cervical spine oblique (1 of 2)]
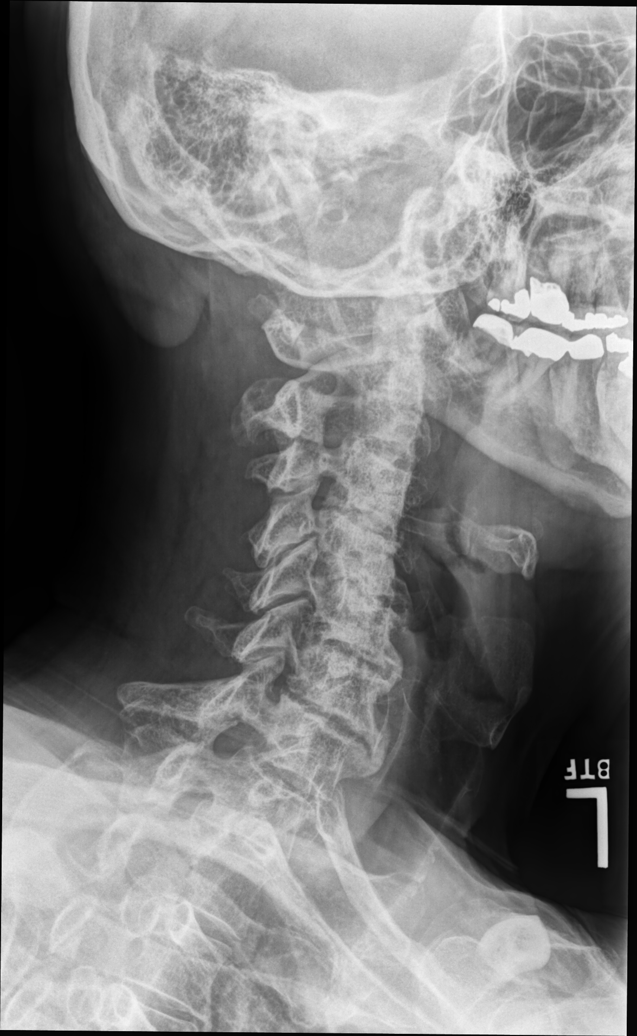

[cervical spine oblique (2 of 2)]
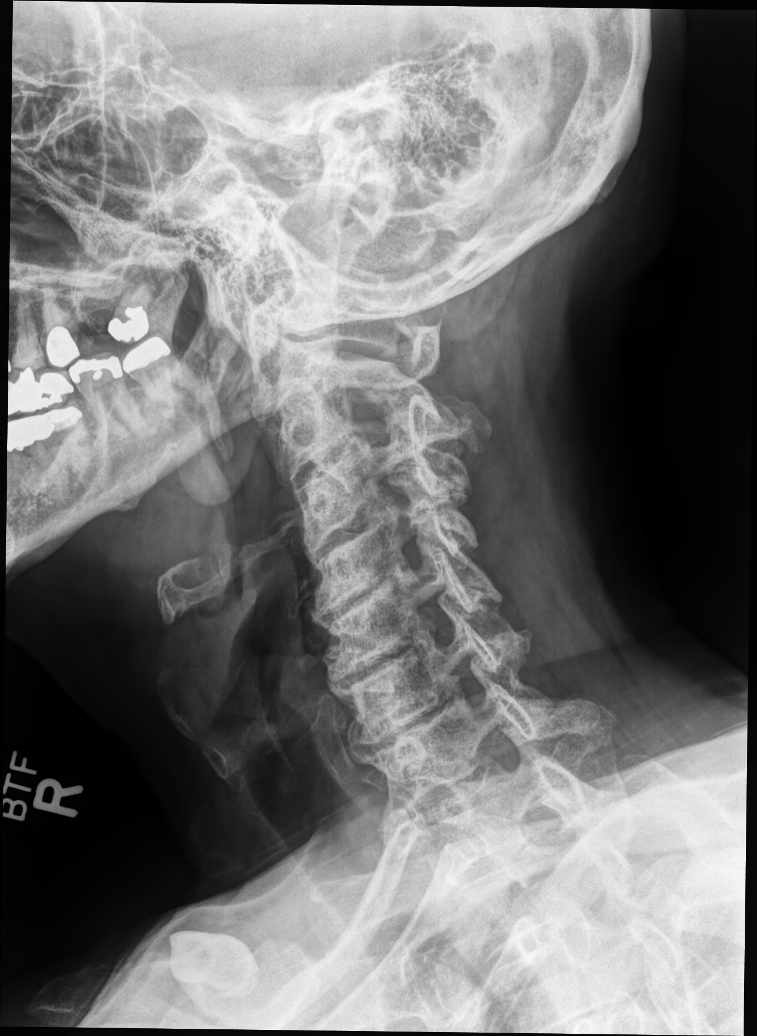

[cervical spine ap (1 of 2)]
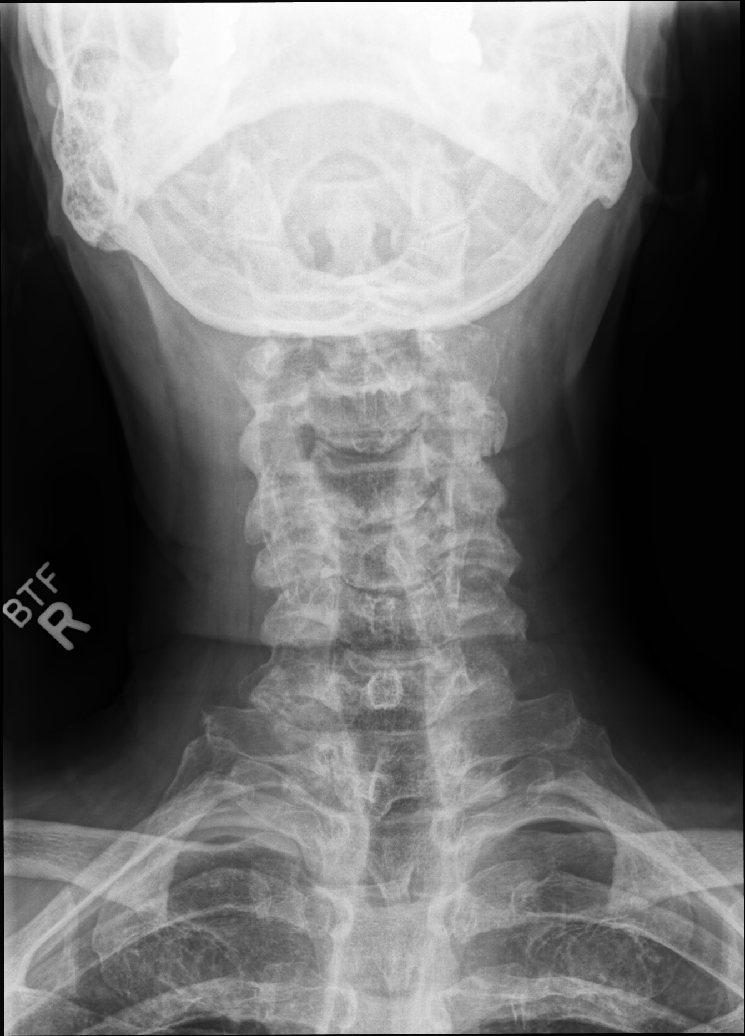

[cervical spine ap (2 of 2)]
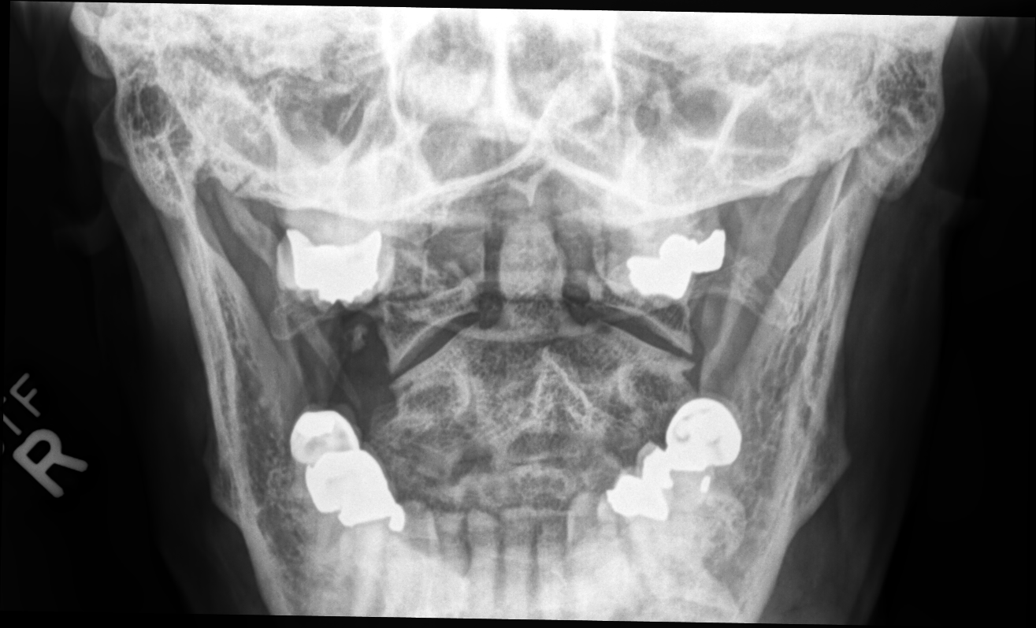

[cervical spine lat (2 of 2)]
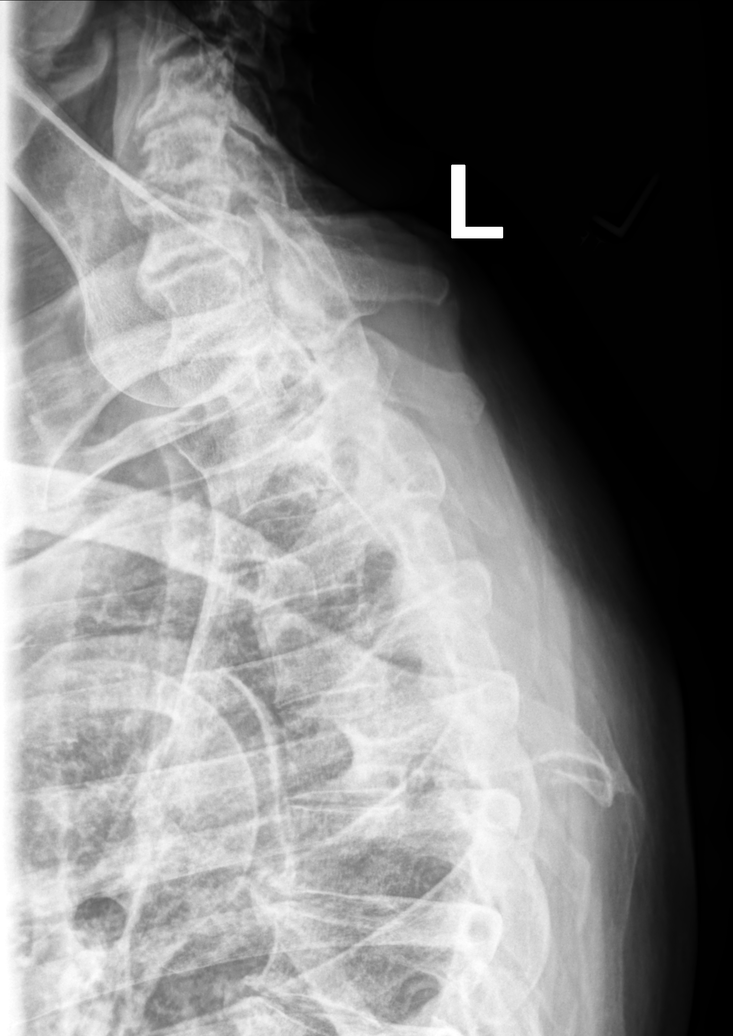

[6 of 6 positions shown; findings below may reference images not displayed]

FINDINGS: On the lateral view the cervical spine is visualized to the level of
the lower C7 endplate, with improved visualization of the C7-T1
level on the swimmer's view. Straightening of the cervical spine.
Pre-vertebral soft tissues are within normal limits. No fracture is
detected in the cervical spine. Dens is well positioned between the
lateral masses of C1. Moderate multilevel degenerative disc disease
throughout the cervical spine with moderate anterior marginal
osteophytes, most prominent at C4-5, C5-6 and C6-7. There is 2 mm
anterolisthesis at C2-3 and C3-4. Mild bilateral facet arthropathy.
Moderate degenerative foraminal stenosis on the right at C4-5. Mild
degenerative foraminal stenosis on the right at C6-7. No
aggressive-appearing focal osseous lesions. These findings have
progressed since [LK] radiographs.
IMPRESSION: 1. Straightening of the cervical spine, usually due to positioning
and/or muscle spasm.
2. Moderate multilevel degenerative disc disease throughout the
cervical spine, most prominent in the lower cervical spine,
progressed since [DATE]. Mild multilevel cervical spondylolisthesis.
4. Mild to moderate right-sided degenerative foraminal stenosis.

## 2018-01-11 IMAGING — DX DG THORACIC SPINE 3V
3 series · 3 of 3 positions shown · non-contrast
Comparison: Chest radiograph [DATE]

CLINICAL DATA: Chronic midline thoracic back pain. Pain with
palpation between shoulder blades. Worsening pain in the posterior
neck with radiation to the shoulders and upper back.

EXAM:
THORACIC SPINE - 3 VIEWS

[thoracic spine ap]
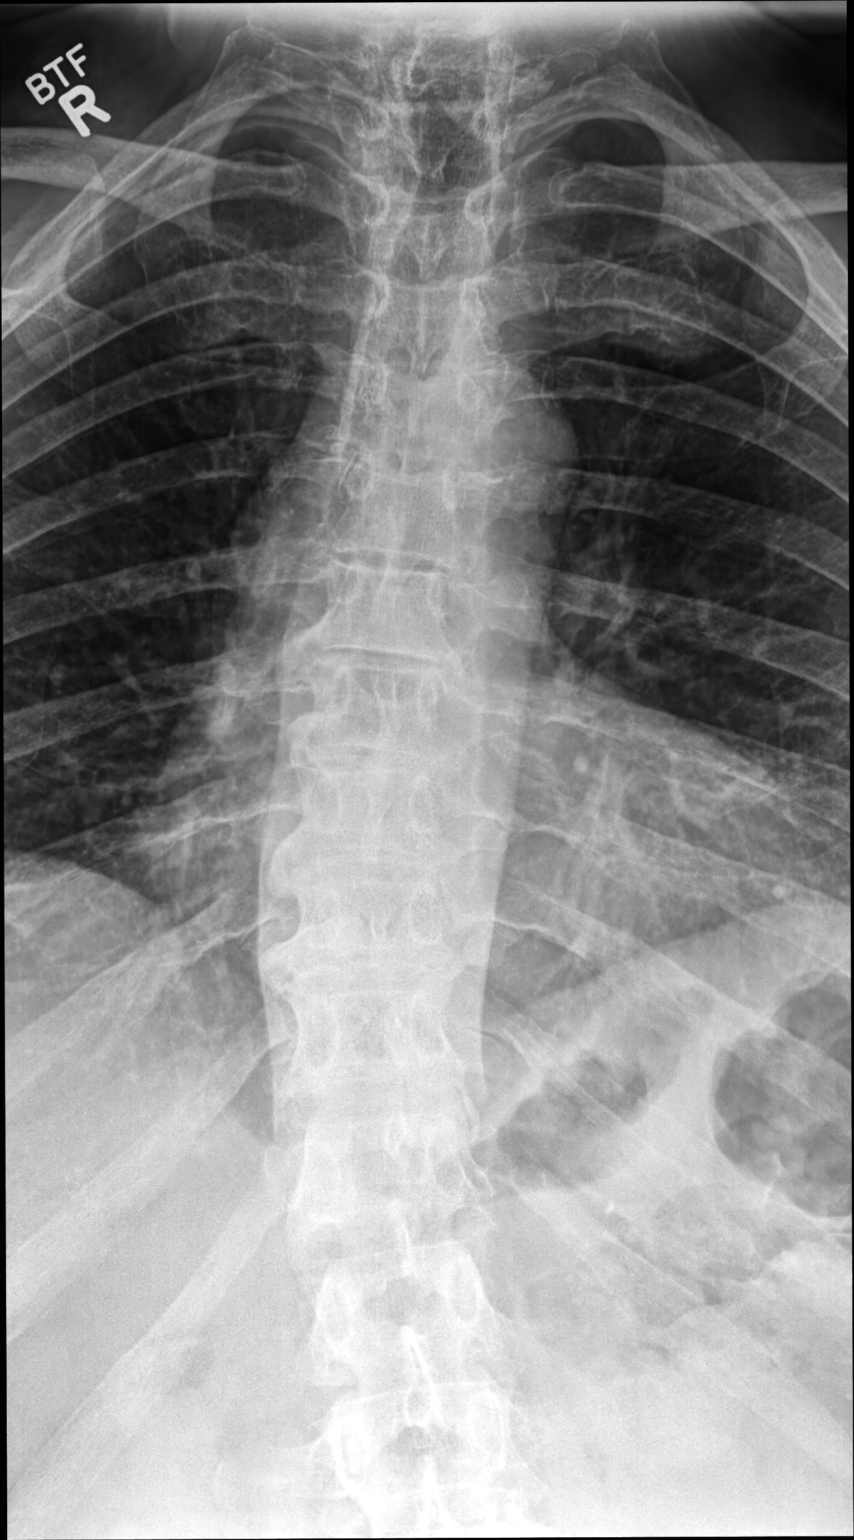

[thoracic spine lat (1 of 2)]
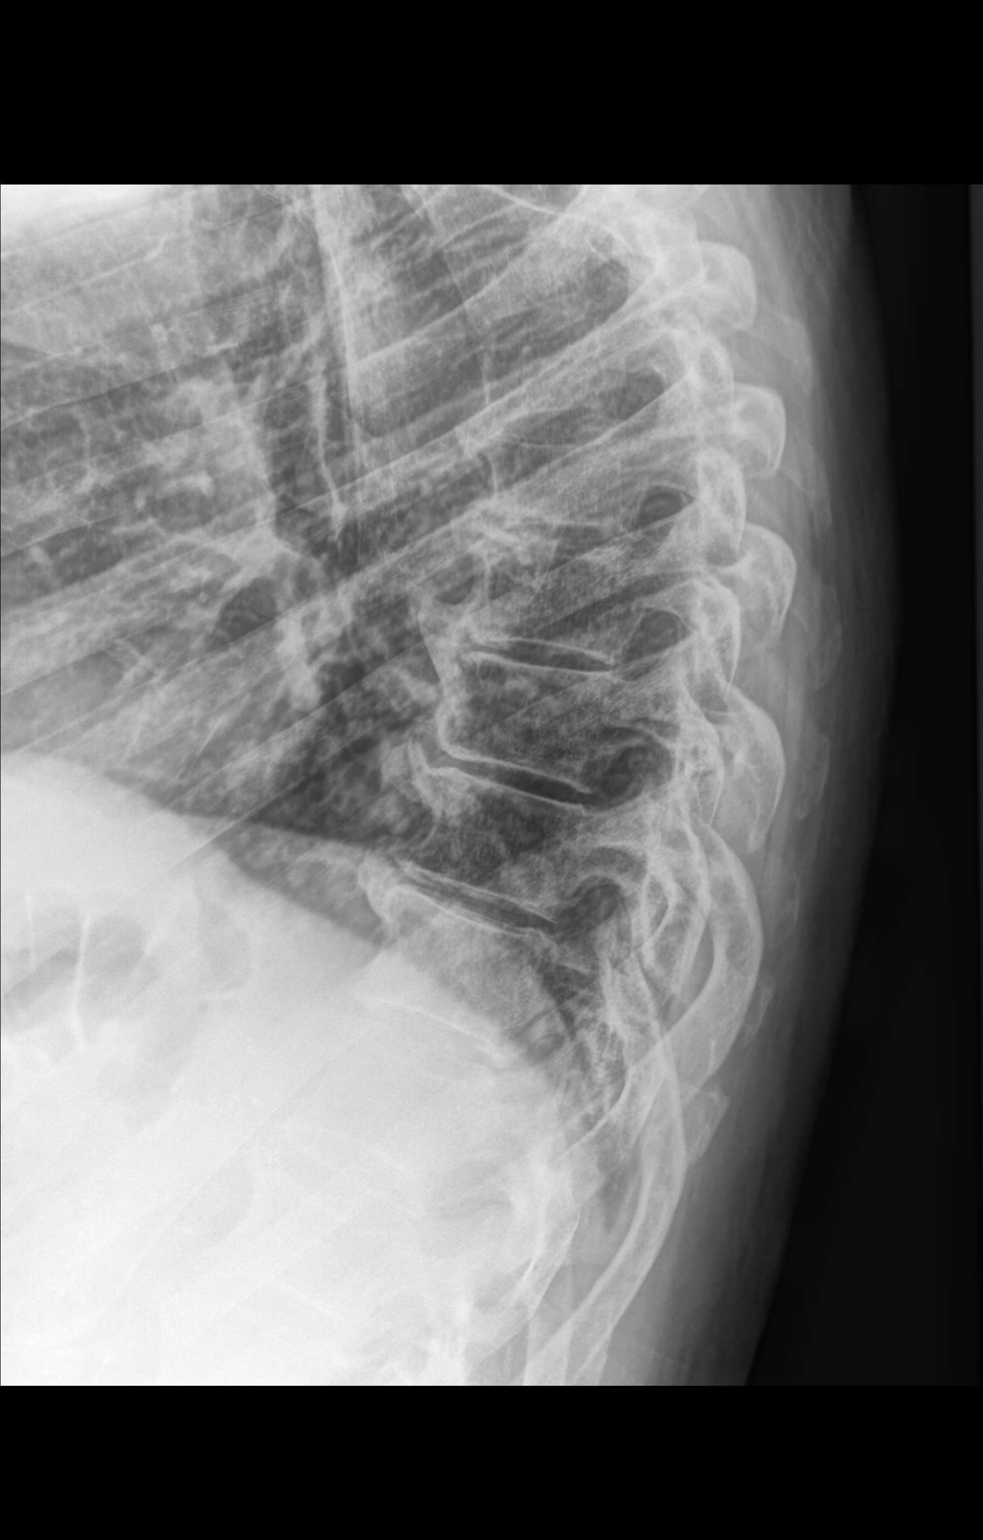

[thoracic spine lat (2 of 2)]
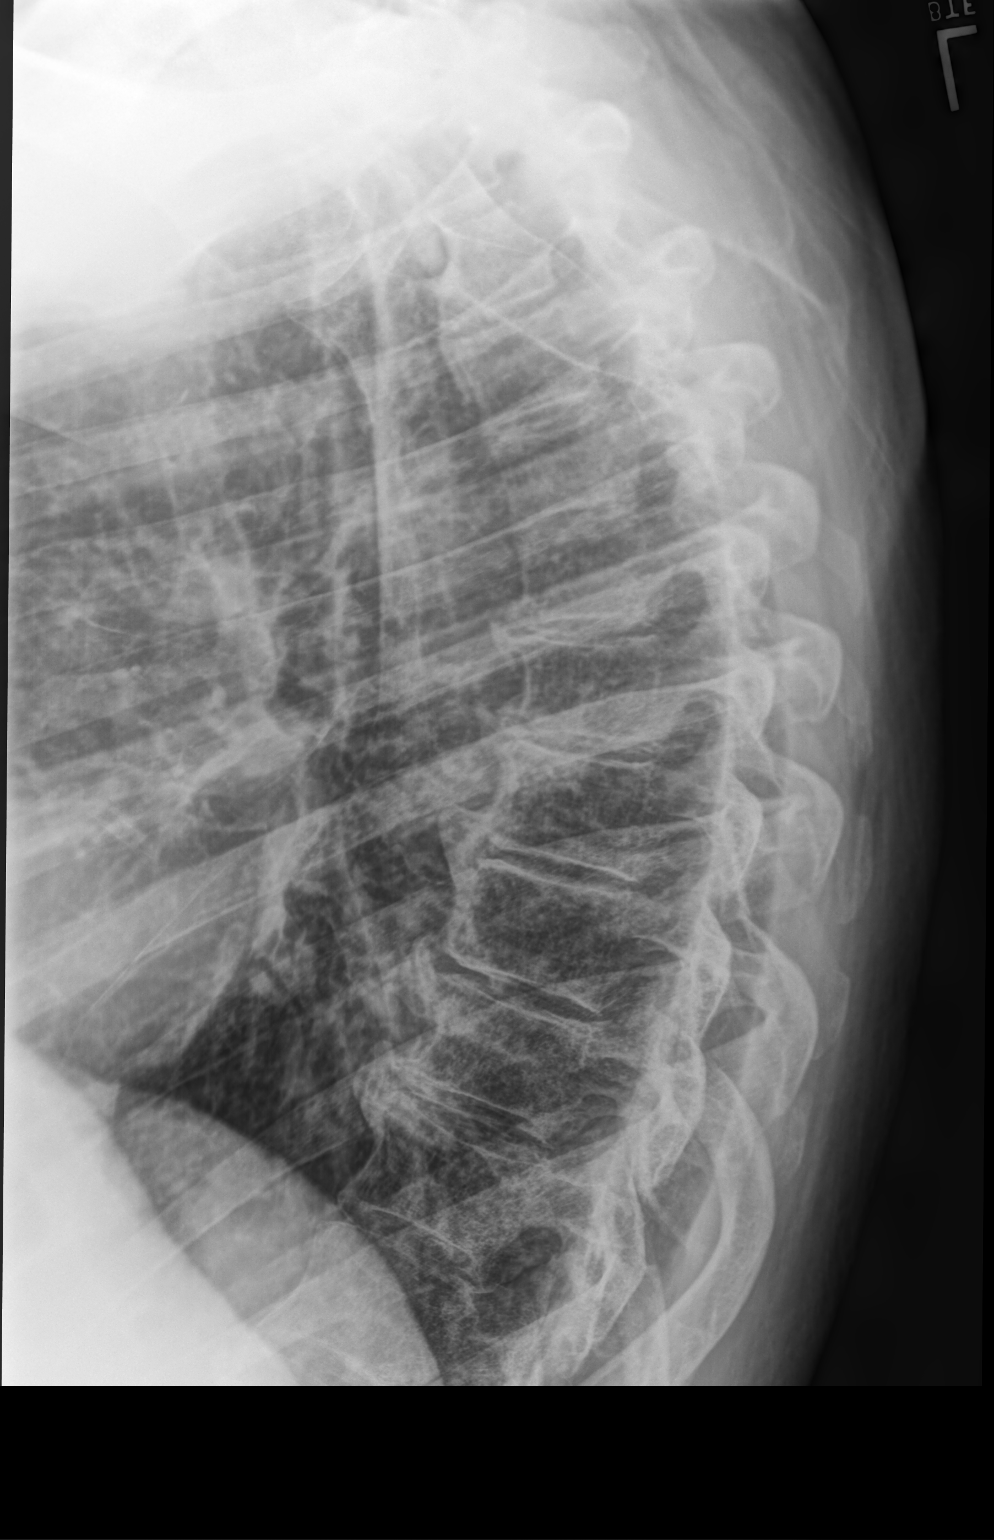

[3 of 3 positions shown; findings below may reference images not displayed]

FINDINGS: Mild curvature in the mid and lower thoracic spine towards the
right. Multilevel degenerative endplate disease with bridging
osteophyte in the mid and lower thoracic spine. Normal alignment at
the thoracolumbar junction. The vertebral body heights are
maintained.
IMPRESSION: Mild scoliosis and multilevel degenerative disease in the thoracic
spine. No acute bone abnormality.

## 2018-01-11 NOTE — Assessment & Plan Note (Signed)
S: controlled on-eating plan in the past.  He has been orthostatic on blood pressure medicine in the past. BP Readings from Last 3 Encounters:  01/11/18 (!) 144/72  12/05/17 126/82  09/18/17 132/72  A/P: We discussed blood pressure goal of <140/90 ideally.  Since this is his first elevation in some time and he is in pain-we agreed to simply recheck at next visit

## 2018-01-11 NOTE — Progress Notes (Signed)
Subjective:  Sean Lane is a 84 y.o. year old very pleasant male patient who presents for/with See problem oriented charting ROS-denies paresthesias.  No extremity weakness.  No chest pain or shortness of breath.  No fecal or urinary incontinence.  Past Medical History-  Patient Active Problem List   Diagnosis Date Noted  . Diabetes ([place]) 12/14/2012    Priority: High  . Atrial flutter ([place]) 10/03/2011    Priority: High  . CAD (coronary artery disease) 12/17/2007    Priority: High  . SINUS BRADYCARDIA 07/20/2010    Priority: Medium  . BPH associated with nocturia 06/08/2009    Priority: Medium  . DIZZINESS 12/15/2008    Priority: Medium  . Hyperlipidemia 04/02/2007    Priority: Medium  . Essential hypertension 04/02/2007    Priority: Medium  . Nephrolithiasis 05/07/2014    Priority: Low  . Gout attack 12/14/2012    Priority: Low  . Hematuria 03/28/2012    Priority: Low  . RBBB 12/28/2008    Priority: Low  . GERD 12/15/2008    Priority: Low  . LOSS, CONDUCTIVE HEARING, COMBINED TYPE 04/16/2007    Priority: Low  . Allergic rhinitis 04/02/2007    Priority: Low  . OA (osteoarthritis) of knee 04/02/2007    Priority: Low  . Thrombocytopenia ([place]) 12/04/2016  . Abnormal nuclear stress test 10/26/2016  . Preoperative cardiovascular examination 10/26/2016  . CAD S/P percutaneous coronary angioplasty 10/26/2016  . Diverticulitis of colon 11/22/2015    Medications- reviewed and updated Current Outpatient Medications  Medication Sig Dispense Refill  . acetaminophen (TYLENOL) 650 MG CR tablet Take 1,300 mg by mouth every 8 (eight) hours as needed for pain.    Marland Kitchen aspirin EC 81 MG tablet Take 81 mg by mouth daily.    Marland Kitchen CREAM BASE EX Apply 1 application topically at bedtime. Diabetics' Dry Skin Relief (applied to feet)    . dutasteride (AVODART) 0.5 MG capsule Take 1 capsule (0.5 mg total) by mouth daily. 90 capsule 1  . glucose blood (FREESTYLE INSULINX TEST) test strip  USE TO CHECK BLOOD SUGAR DAILY AND AS NEEDED 100 each 4  . Lancets (FREESTYLE) lancets CHECK BLOOD SUGAR ONCE DAILY 100 each 4  . losartan (COZAAR) 25 MG tablet Take 1 tablet (25 mg total) by mouth daily. 30 tablet 11  . Naphazoline-Pheniramine (OPCON-A OP) Apply 1 drop to eye daily as needed (allergies).    . nitroGLYCERIN (NITROSTAT) 0.4 MG SL tablet Place 1 tablet (0.4 mg total) under the tongue every 5 (five) minutes as needed for chest pain (for chest pain). 25 tablet 6  . NONFORMULARY OR COMPOUNDED ITEM Apply 1 application topically 3 (three) times daily as needed (for itchy/irritated skin). CLOBETASOL 0.05% MIX WITH CETAPHIL    . Omega-3 Fatty Acids (FISH OIL) 1200 MG CAPS Take 2 capsules by mouth 2 (two) times daily.    Marland Kitchen oxymetazoline (AFRIN) 0.05 % nasal spray Place 1 spray into both nostrils 2 (two) times daily as needed for congestion.    . rosuvastatin (CRESTOR) 10 MG tablet TAKE 1 TABLET DAILY 90 tablet 3  . tamsulosin (FLOMAX) 0.4 MG CAPS capsule TAKE 1 CAPSULE DAILY 90 capsule 1  . XARELTO 20 MG TABS tablet TAKE 1 TABLET DAILY WITH   SUPPER 90 tablet 1   No current facility-administered medications for this visit.     Objective: BP (!) 144/72   Pulse (!) 53   Temp 97.8 F (36.6 C) (Oral)   Ht 6\' 1"  (1.854  m)   Wt 193 lb 6.4 oz (87.7 kg)   SpO2 96%   BMI 25.52 kg/m  Gen: NAD, resting comfortably CV: RRR no murmurs rubs or gallops Lungs: CTAB no crackles, wheeze, rhonchi Abdomen: soft/nondistended Ext: no edema MSK: Patient with minimal tenderness to paraspinous muscles along cervical and upper thoracic spine.  He does have some midline tenderness along vertebrae just between shoulder blades. Skin: warm, dry Neuro: Normal strength in upper extremities  Assessment/Plan:  Neck pain - Plan: DG Cervical Spine Complete Chronic midline thoracic back pain - Plan: DG Thoracic Spine W/Swimmers  s:  Started with neck pain 6-8 months ago.  He says he mentioned this briefly  last time he was here-mainly asking if it was okay to use Tylenol arthritis.  Has seen chiropractor and temporarily helps. Cant sleep- wakes up at 2-3 in AM. Takes tylenol arthritis and gets back to sleep. Pain can be up to 9/10 and is there all the time. No paresthesias or pain down into arms. Has not done x-rays with chiropractor.  He denies any issues with his shoulders or pelvic girdle.  Pain seems to be worse in the morning but also is constant through the day.  He states he has a very high pain threshold  He says the pain is so bothersome That it distracts him and he has had Erectile issues.  A/P: 84 year old male with neck and upper back pain.  He does have some midline tenderness along the thoracic vertebrae.  We will get x-rays to evaluate for compression fracture or mass.  I doubt polymyalgia rheumatica with shoulders and pelvic girdle not being involved.  He does think he was told he had cervical arthritis years ago and we discussed this could be the primary cause.  I would be willing to trial some prednisone-but we agreed to wait until radiology reads his x-rays.  If he fails to improve with this likely refer to orthopedics or sports medicine  Essential hypertension S: controlled on-eating plan in the past.  He has been orthostatic on blood pressure medicine in the past. BP Readings from Last 3 Encounters:  01/11/18 (!) 144/72  12/05/17 126/82  09/18/17 132/72  A/P: We discussed blood pressure goal of <140/90 ideally.  Since this is his first elevation in some time and he is in pain-we agreed to simply recheck at next visit     Future Appointments  Date Time Provider [place]  02/12/2018  2:15 PM [name], MD [facility]  03/01/2018  3:15 PM Evelina Bucy, DPM [facility]  06/07/2018 10:00 AM [name], MD [facility]  09/19/2018  1:00 PM Williemae Area, RN [facility]  Follow-up depending on results of x-rays  Return precautions  advised.  [name], MD

## 2018-01-11 NOTE — Patient Instructions (Addendum)
Should let you know by Monday or Tuesday about x-rays  Stop by x-ray before you go- after the films you may leave  I am considering prednisone trial if there is no fracture or other obvious bony issue.   I suspect arthritis is at play one way or the other- I also have a feeling we are going to need to get orthopedics involved but I think trying prednisone first is reasonable if films are ok

## 2018-01-14 NOTE — Progress Notes (Signed)
Multiple levels of arthritis in the spine were noted.  This could certainly cause his pain that he is experiencing.  Please send in prednisone 20 mg tablets- take 2 daily for 7 days #14 to see if this may help.  It would likely only help if there is inflammation of 1 of the nerves.  This may not help otherwise but I think it is worth a try.  See separate note on thoracic spine films.

## 2018-01-15 ENCOUNTER — Other Ambulatory Visit: Payer: Self-pay

## 2018-01-15 ENCOUNTER — Telehealth: Payer: Self-pay

## 2018-01-15 MED ORDER — PREDNISONE 20 MG PO TABS: ORAL_TABLET | ORAL | 0 refills | Status: DC

## 2018-01-15 NOTE — Telephone Encounter (Signed)
Called patient and his wife answered. She took the message for him. I gave her his lab results and I also sent in his prednisone to his pharmacy. Patient's wife verbalized understanding.

## 2018-01-17 ENCOUNTER — Encounter: Payer: Self-pay | Admitting: Family Medicine

## 2018-01-18 ENCOUNTER — Encounter: Payer: Self-pay | Admitting: Cardiovascular Disease

## 2018-01-18 ENCOUNTER — Other Ambulatory Visit: Payer: Self-pay

## 2018-01-18 MED ORDER — NITROGLYCERIN 0.4 MG SL SUBL: 0.4000 mg | SUBLINGUAL_TABLET | SUBLINGUAL | 3 refills | Status: DC | PRN

## 2018-01-18 NOTE — Progress Notes (Signed)
Patient needed refill on Nitro, their Nitro had expired.

## 2018-01-23 ENCOUNTER — Encounter: Payer: Self-pay | Admitting: Family Medicine

## 2018-01-24 ENCOUNTER — Other Ambulatory Visit: Payer: Self-pay

## 2018-01-24 DIAGNOSIS — G8929 Other chronic pain: Secondary | ICD-10-CM

## 2018-01-24 DIAGNOSIS — M546 Pain in thoracic spine: Principal | ICD-10-CM

## 2018-02-11 NOTE — Progress Notes (Signed)
Patient ID: Sean Lane, male   DOB: 07/30/1928, 84 y.o.   MRN: [id number]  84 y.o. history of flutter, HTN, CAD and SSS/RBBB. Was on coumadin and changed to Xarelto June 2017  DES x 2 to circumflex 2009 EF normal by TTE 2015 with mild MR   Seen 10/2016 for preop  clearance Myovue false positive suggested inferior infarct/ischemia  Cath 10/26/16 films reviewed:   Small, non-dominant RCA: Prox RCA lesion, 70 %stenosed. Mid RCA lesion, 70 %stenosed.  Prox LAD to Mid LAD lesion, 50 %stenosed - calicified/irregular. Mid LAD lesion, 40 %stenosed.  Prox Cx to Mid Cx overlapping Cypeher DES (from 11/2007), 0 %stenosed.  The left ventricular systolic function is normal. The left ventricular ejection fraction is 55-65% by visual estimate.    Had right unicompartmental arthroplasty with Dr Maureen Ralphs 03/20/61 no complications   ARB added for HTN November 2018   ROS: Denies fever, malais, weight loss, blurry vision, decreased visual acuity, cough, sputum, SOB, hemoptysis, pleuritic pain, palpitaitons, heartburn, abdominal pain, melena, lower extremity edema, claudication, or rash.  All other systems reviewed and negative  General: Affect appropriate Elderly white male  HEENT: normal Neck supple with no adenopathy JVP normal no bruits no thyromegaly Lungs clear with no wheezing and good diaphragmatic motion Heart:  S1/S2 no murmur, no rub, gallop or click PMI normal Abdomen: benighn, BS positve, no tenderness, no AAA no bruit.  No HSM or HJR Distal pulses intact with no bruits No edema Neuro non-focal Skin warm and dry No muscular weakness Post right knee surgery with good mobility    Current Outpatient Medications  Medication Sig Dispense Refill  . acetaminophen (TYLENOL) 650 MG CR tablet Take 1,300 mg by mouth every 8 (eight) hours as needed for pain.    Marland Kitchen aspirin EC 81 MG tablet Take 81 mg by mouth daily.    Marland Kitchen CREAM BASE EX Apply 1 application topically at bedtime. Diabetics' Dry  Skin Relief (applied to feet)    . dutasteride (AVODART) 0.5 MG capsule Take 1 capsule (0.5 mg total) by mouth daily. 90 capsule 1  . glucose blood (FREESTYLE INSULINX TEST) test strip USE TO CHECK BLOOD SUGAR DAILY AND AS NEEDED 100 each 4  . Lancets (FREESTYLE) lancets CHECK BLOOD SUGAR ONCE DAILY 100 each 4  . losartan (COZAAR) 25 MG tablet Take 1 tablet (25 mg total) by mouth daily. 30 tablet 11  . Naphazoline-Pheniramine (OPCON-A OP) Apply 1 drop to eye daily as needed (allergies).    . nitroGLYCERIN (NITROSTAT) 0.4 MG SL tablet Place 1 tablet (0.4 mg total) under the tongue every 5 (five) minutes as needed for chest pain (for chest pain). 25 tablet 3  . NONFORMULARY OR COMPOUNDED ITEM Apply 1 application topically 3 (three) times daily as needed (for itchy/irritated skin). CLOBETASOL 0.05% MIX WITH CETAPHIL    . Omega-3 Fatty Acids (FISH OIL) 1200 MG CAPS Take 2 capsules by mouth 2 (two) times daily.    Marland Kitchen oxymetazoline (AFRIN) 0.05 % nasal spray Place 1 spray into both nostrils 2 (two) times daily as needed for congestion.    . rosuvastatin (CRESTOR) 10 MG tablet TAKE 1 TABLET DAILY 90 tablet 3  . tamsulosin (FLOMAX) 0.4 MG CAPS capsule TAKE 1 CAPSULE DAILY 90 capsule 1  . XARELTO 20 MG TABS tablet TAKE 1 TABLET DAILY WITH   SUPPER 90 tablet 1   No current facility-administered medications for this visit.     Allergies  Hydromorphone hcl; Ketoconazole; and Sulfonamide derivatives  Electrocardiogram:  Sinus arrhythmia RBBB no high grade AV block 04/30/14  05/03/16  SR rate 69 RBBB   Assessment and Plan CAD: patent stents circumflex cath 10/26/16 continue medical Rx   PAF: maint NSR continue xarelto no issues when held for surgery   Anticoagulation: renal function good no bleeding issues continue xarelto  SSS: monitor with no pauses.  Baseline RBBB  ETT with adequate HR response  Observe  HTN Salt and weight sensitive improved with addition of ARB November 2018   Chol:  Lab  Results  Component Value Date   LDLCALC 54 04/09/2014   Diverticulitis:  Resolved documented diverticulosis on previous colonoscopy  Discussed low fiber diet  Ortho: post right knee surgery 11/01/16 continue PT/OT ambulation much improved   Jenkins Rouge

## 2018-02-12 ENCOUNTER — Ambulatory Visit (INDEPENDENT_AMBULATORY_CARE_PROVIDER_SITE_OTHER): Payer: Medicare Other | Admitting: Cardiovascular Disease

## 2018-02-12 ENCOUNTER — Encounter: Payer: Self-pay | Admitting: Cardiovascular Disease

## 2018-02-12 VITALS — BP 142/74 | HR 62 | Ht 73.0 in | Wt 189.2 lb

## 2018-02-12 DIAGNOSIS — I2510 Atherosclerotic heart disease of native coronary artery without angina pectoris: Secondary | ICD-10-CM | POA: Diagnosis not present

## 2018-02-12 DIAGNOSIS — I4510 Unspecified right bundle-branch block: Secondary | ICD-10-CM | POA: Diagnosis not present

## 2018-02-12 DIAGNOSIS — I10 Essential (primary) hypertension: Secondary | ICD-10-CM

## 2018-02-12 NOTE — Patient Instructions (Signed)

## 2018-02-16 ENCOUNTER — Other Ambulatory Visit: Payer: Self-pay | Admitting: Family Medicine

## 2018-03-01 ENCOUNTER — Ambulatory Visit: Payer: Medicare Other | Admitting: Podiatry

## 2018-03-08 ENCOUNTER — Ambulatory Visit (INDEPENDENT_AMBULATORY_CARE_PROVIDER_SITE_OTHER): Payer: Medicare Other | Admitting: Podiatry

## 2018-03-08 DIAGNOSIS — E1151 Type 2 diabetes mellitus with diabetic peripheral angiopathy without gangrene: Secondary | ICD-10-CM

## 2018-03-08 DIAGNOSIS — D689 Coagulation defect, unspecified: Secondary | ICD-10-CM

## 2018-03-08 DIAGNOSIS — B351 Tinea unguium: Secondary | ICD-10-CM

## 2018-03-10 NOTE — Progress Notes (Signed)
  Subjective:  Patient ID: Sean Lane, male    DOB: 1928/07/19,  MRN: [id number]  No chief complaint on file.  84 y.o. male returns for diabetic foot care.  Takes Xarelto for anticoagulation. No new issues.  Objective:   General AA&O x3. Normal mood and affect.  Vascular Dorsalis pedis pulses present 1+ bilaterally  Posterior tibial pulses 1+ bilaterally  Capillary refill normal to all digits. Pedal hair growth normal.  Neurologic Epicritic sensation present bilaterally. Protective sensation with 5.07 monofilament  present bilaterally. Vibratory sensation present bilaterally.  Dermatologic No open lesions. Interspaces clear of maceration.  Normal skin temperature and turgor. Hyperkeratotic lesions: None bilaterally. Nails: brittle, onychomycosis, thickening, elongation  Orthopedic: No history of amputation. MMT 5/5 in dorsiflexion, plantarflexion, inversion, and eversion. Normal lower extremity joint ROM without pain or crepitus.   Assessment & Plan:  Patient was evaluated and treated and all questions answered.  Diabetes with coagulation defect, Onychomycosis -Educated on diabetic footcare. Diabetic risk level 1 -At risk foot care provided as below.  Procedure: Nail Debridement Rationale: Patient meets criteria for routine foot care due to coagulation defect Type of Debridement: manual, sharp debridement. Instrumentation: Nail nipper, rotary burr. Number of Nails: 10  No follow-ups on file.

## 2018-04-25 ENCOUNTER — Ambulatory Visit (INDEPENDENT_AMBULATORY_CARE_PROVIDER_SITE_OTHER): Payer: Medicare Other | Admitting: Family Medicine

## 2018-04-25 ENCOUNTER — Encounter: Payer: Self-pay | Admitting: Sports Medicine

## 2018-04-25 ENCOUNTER — Encounter: Payer: Self-pay | Admitting: Family Medicine

## 2018-04-25 ENCOUNTER — Other Ambulatory Visit: Payer: Self-pay | Admitting: Family Medicine

## 2018-04-25 ENCOUNTER — Ambulatory Visit (INDEPENDENT_AMBULATORY_CARE_PROVIDER_SITE_OTHER): Payer: Medicare Other | Admitting: Sports Medicine

## 2018-04-25 VITALS — BP 114/58 | HR 52 | Temp 98.0°F | Ht 73.0 in | Wt 193.0 lb

## 2018-04-25 VITALS — BP 114/58 | HR 52 | Ht 73.0 in | Wt 193.0 lb

## 2018-04-25 DIAGNOSIS — M542 Cervicalgia: Secondary | ICD-10-CM | POA: Diagnosis not present

## 2018-04-25 DIAGNOSIS — M549 Dorsalgia, unspecified: Secondary | ICD-10-CM

## 2018-04-25 DIAGNOSIS — M9902 Segmental and somatic dysfunction of thoracic region: Secondary | ICD-10-CM | POA: Diagnosis not present

## 2018-04-25 DIAGNOSIS — I2510 Atherosclerotic heart disease of native coronary artery without angina pectoris: Secondary | ICD-10-CM | POA: Diagnosis not present

## 2018-04-25 NOTE — Patient Instructions (Addendum)
Health Maintenance Due  Topic Date Due  . INFLUENZA VACCINE -Schedule for this Fall 04/11/2018      

## 2018-04-25 NOTE — Patient Instructions (Signed)
Please perform the exercise program that we have prepared for you and gone over in detail on a daily basis.  In addition to the handout you were provided you can access your program through: [email or web address]   Your unique program code is: [email or web address]

## 2018-04-25 NOTE — Progress Notes (Signed)
Subjective:  Sean Lane is a 84 y.o. year old very pleasant male patient who presents for/with See problem oriented charting ROS- No chest pain or shortness of breath. No headache or blurry vision.  No weakness in arms. No numbness or tingling in the arms.    Past Medical History-  Patient Active Problem List   Diagnosis Date Noted  . Diabetes ([place]) 12/14/2012    Priority: High  . Atrial flutter ([place]) 10/03/2011    Priority: High  . CAD (coronary artery disease) 12/17/2007    Priority: High  . SINUS BRADYCARDIA 07/20/2010    Priority: Medium  . BPH associated with nocturia 06/08/2009    Priority: Medium  . DIZZINESS 12/15/2008    Priority: Medium  . Hyperlipidemia 04/02/2007    Priority: Medium  . Essential hypertension 04/02/2007    Priority: Medium  . Nephrolithiasis 05/07/2014    Priority: Low  . Gout attack 12/14/2012    Priority: Low  . Hematuria 03/28/2012    Priority: Low  . RBBB 12/28/2008    Priority: Low  . GERD 12/15/2008    Priority: Low  . LOSS, CONDUCTIVE HEARING, COMBINED TYPE 04/16/2007    Priority: Low  . Allergic rhinitis 04/02/2007    Priority: Low  . OA (osteoarthritis) of knee 04/02/2007    Priority: Low  . Thrombocytopenia ([place]) 12/04/2016  . Abnormal nuclear stress test 10/26/2016  . Preoperative cardiovascular examination 10/26/2016  . CAD S/P percutaneous coronary angioplasty 10/26/2016  . Diverticulitis of colon 11/22/2015    Medications- reviewed and updated Current Outpatient Medications  Medication Sig Dispense Refill  . acetaminophen (TYLENOL) 650 MG CR tablet Take 1,300 mg by mouth every 8 (eight) hours as needed for pain.    Marland Kitchen aspirin EC 81 MG tablet Take 81 mg by mouth daily.    Marland Kitchen CREAM BASE EX Apply 1 application topically at bedtime. Diabetics' Dry Skin Relief (applied to feet)    . dutasteride (AVODART) 0.5 MG capsule Take 1 capsule (0.5 mg total) by mouth daily. 90 capsule 1  . glucose blood (FREESTYLE INSULINX TEST)  test strip USE TO CHECK BLOOD SUGAR DAILY AND AS NEEDED 100 each 4  . Lancets (FREESTYLE) lancets CHECK BLOOD SUGAR ONCE DAILY 100 each 4  . losartan (COZAAR) 25 MG tablet Take 1 tablet (25 mg total) by mouth daily. 30 tablet 11  . Naphazoline-Pheniramine (OPCON-A OP) Apply 1 drop to eye daily as needed (allergies).    . nitroGLYCERIN (NITROSTAT) 0.4 MG SL tablet Place 1 tablet (0.4 mg total) under the tongue every 5 (five) minutes as needed for chest pain (for chest pain). 25 tablet 3  . NONFORMULARY OR COMPOUNDED ITEM Apply 1 application topically 3 (three) times daily as needed (for itchy/irritated skin). CLOBETASOL 0.05% MIX WITH CETAPHIL    . Omega-3 Fatty Acids (FISH OIL) 1200 MG CAPS Take 2 capsules by mouth 2 (two) times daily.    Marland Kitchen oxymetazoline (AFRIN) 0.05 % nasal spray Place 1 spray into both nostrils 2 (two) times daily as needed for congestion.    . rosuvastatin (CRESTOR) 10 MG tablet TAKE 1 TABLET DAILY 90 tablet 3  . tamsulosin (FLOMAX) 0.4 MG CAPS capsule TAKE 1 CAPSULE DAILY 90 capsule 1  . XARELTO 20 MG TABS tablet TAKE 1 TABLET DAILY WITH   SUPPER 90 tablet 1   No current facility-administered medications for this visit.     Objective: BP (!) 114/58 (BP Location: Left Arm, Patient Position: Sitting, Cuff Size: Large)  Pulse (!) 52   Temp 98 F (36.7 C) (Oral)   Ht 6\' 1"  (1.854 m)   Wt 193 lb (87.5 kg)   SpO2 94%   BMI 25.46 kg/m  Gen: NAD, resting comfortably CV: RRR no murmurs rubs or gallops Lungs: CTAB no crackles, wheeze, rhonchi Skin: warm, dry, no rash over back Neuro: good strength in upper extremities and sensation MSK: reasonable ROM of neck, no pain with palpation in back  Assessment/Plan:  Upper back pain S:  patient was having neck pain and upper back pain back in may. We did a course of prednisone and that was helpful for the neck- only slight improvement in upper back pain.  X-rays showed degenerative changes in thoracic and cervical spine. Pain  has worsened since may. Last night trying to get into the bed last night it was up to 10/10 and he was in tears from pain.   Tylenol extra strength every night helps him - he takes it when he wakes up in pain about 3 AM. Then usually good until about noon and it starts again (other than getting out of the bed). Pain definitely worse with laying down- he feels better up and moving in the daytime. Sparingly takes tylenol extra strength in the daytime.  A/P: told patient this is likely related to his known arthritis from films a few months ago. Since he is getting reasonable relief with extended release tylenol/extra strength advised 1300mg  at night then 650 mg doses other 2 doses of the day (spaced by 8 hours each). He was worried about his liver and we discussed under 3000mg  per day would be fine - with extended release can actually go to 3900 mg but we didn't discuss this. Best to avoid nsaids. With DM so wellc ontrolled could use prednisone again if needed.   Will refer to Dr. Paulla Fore for his expert opinion given severity of pains and affecting sleep.    Future Appointments  Date Time Provider [place]  05/10/2018  1:15 PM Evelina Bucy, DPM [facility]  06/07/2018 10:00 AM [name], MD [facility]  09/19/2018  1:00 PM [facility]   Lab/Order associations: Low back pain, unspecified back pain laterality, unspecified chronicity, with sciatica presence unspecified - Plan: Ambulatory referral to Sports Medicine  Return precautions advised.  [name], MD

## 2018-04-25 NOTE — Progress Notes (Signed)
Sean Lane. Sean Lane, [place] at [place]  Sean Lane - 84 y.o. male MRN [id number]  Date of birth: [birth year]  Visit Date: 04/25/2018  PCP: [name], MD   Referred by: [name], MD  Scribe(s) for today's visit: Josepha Pigg, CMA  SUBJECTIVE:  Sean Dare "Till" is here for Initial Assessment (back pain) .  Referred by: Dr. [name]  His back pain symptoms INITIALLY: Began several years ago and has worsened over the past 1-1.5 years. Last night he could barely get into bed.  Described as moderate aching (like a pulled muscle), nonradiating. Pain can be severe at times. He reports sharp pain when coughing or sneezing. Worsened with lying down at night and first thing in the morning.  Improved with movement Additional associated symptoms include: Pain is worst just between the shoulder blades.     At this time symptoms are worsening compared to onset, pain was so severe last night that he was almost in tears.  He had been prescribed steroid dosepack which helped with neck pain but not with mid pain. He takes 2 Tylenol when he wakes from sleep (around 3:15 AM).  and that gives him some relief, enough to sleep through the night.    REVIEW OF SYSTEMS: Reports night time disturbances. Denies fevers, chills, or night sweats. Denies unexplained weight loss. Denies personal history of cancer. Denies changes in bowel or bladder habits. Reports recent unreported falls, fell twice in one day about 1 yr ago, PCP may not be aware. . Denies new or worsening dyspnea or wheezing. Denies headaches or dizziness.  Denies numbness, tingling or weakness  In the extremities.  Denies dizziness or presyncopal episodes Denies lower extremity edema    HISTORY & PERTINENT PRIOR DATA:  Prior History reviewed and updated per electronic medical record.  Significant/pertinent history, findings, studies  include:  reports that he quit smoking about 56 years ago. He has never used smokeless tobacco. Recent Labs    06/05/17 1202 12/05/17 1114  HGBA1C 5.9 5.8   No specialty comments available. No problems updated.  OBJECTIVE:  VS:  HT:6\' 1"  (185.4 cm)   WT:193 lb (87.5 kg)  BMI:25.47    BP:(Abnormal) 114/58  HR:(Abnormal) 52bpm  TEMP: ( )  RESP:94 %   PHYSICAL EXAM: CONSTITUTIONAL: Well-developed, Well-nourished and In no acute distress Alert & appropriately interactive. and Not depressed or anxious appearing. Respiratory: No increased work of breathing.  Trachea Midline EYES: Pupils are equal., EOM intact without nystagmus. and No scleral icterus.  Upper EXTREMITY EXAM: Warm and well perfused NEURO: unremarkable, Normal associated myotomal distribution strength to manual muscle testing, Normal sensation to light touch, Normal and symmetric associated DTRs  MSK Exam: Thoracic curvature to the right.  He has tenderness over the periscapular muscles.  Anterior cervical carriage.  He has restricted cervical sidebending and rotation worse to the right than the left.  PROCEDURES & DATA REVIEWED:   PROCEDURE NOTE : OSTEOPATHIC MANIPULATION The decision today to treat with Osteopathic Manipulative Therapy (OMT) was based on physical exam findings. Verbal consent was obtained following a discussion with the patient regarding the of risks, benefits and potential side effects, including an acute pain flare,post manipulation soreness and need for repeat treatments.     Contraindications to OMT: NONE  Manipulation was performed as below: Regions treated: Thoracic spine OMT Techniques Used: HVLA, muscle energy and myofascial release  The patient tolerated the treatment well  and reported Improved symptoms following treatment today. Patient was given medications, exercises, stretches and lifestyle modifications per AVS and verbally.   OSTEOPATHIC/STRUCTURAL EXAM:   T Spine OMT: T2-T6  neutral rotated right, side and left   PROCEDURE NOTE: THERAPEUTIC EXERCISES (97110) 15 minutes spent for Therapeutic exercises as below and as referenced in the AVS.  This included exercises focusing on stretching, strengthening, with significant focus on eccentric aspects.   Proper technique shown and discussed handout in great detail with ATC.  All questions were discussed and answered.   Long term goals include an improvement in range of motion, strength, endurance as well as avoiding reinjury. Frequency of visits is one time as determined during today's  office visit. Frequency of exercises to be performed is as per handout.  EXERCISES REVIEWED:  Chin tuck, scapular retraction, thoracic rotational exercises seated  ASSESSMENT   1. Somatic dysfunction of thoracic region   2. Upper back pain   3. Neck pain     PLAN:  Osteopathic manipulation was performed today based on physical exam findings.  Please see procedure note for further information including Osteopathic Exam findings  Discussed the foundation of treatment for this condition is physical therapy and/or daily (5-6 days/week) therapeutic exercises, focusing on core strengthening, coordination, neuromuscular control/reeducation.  Therapeutic exercises prescribed per procedure note.  Continue with scheduled Tylenol as previously prescribed by Dr. Yong Channel.  He has marked anterior muscle benefit from continued home exercises.  Follow-up: Return in about 4 weeks (around 05/23/2018).      Please see additional documentation for Objective, Assessment and Plan sections. Pertinent additional documentation may be included in corresponding procedure notes, imaging studies, problem based documentation and patient instructions. Please see these sections of the encounter for additional information regarding this visit.  CMA/ATC served as Education administrator during this visit. History, Physical, and Plan performed by medical provider. Documentation and  orders reviewed and attested to.      Gerda Diss, [facility] Physician

## 2018-05-03 ENCOUNTER — Other Ambulatory Visit: Payer: Self-pay | Admitting: Family Medicine

## 2018-05-10 ENCOUNTER — Ambulatory Visit: Payer: Medicare Other | Admitting: Podiatry

## 2018-05-23 ENCOUNTER — Ambulatory Visit (INDEPENDENT_AMBULATORY_CARE_PROVIDER_SITE_OTHER): Payer: Medicare Other | Admitting: Podiatry

## 2018-05-23 DIAGNOSIS — M792 Neuralgia and neuritis, unspecified: Secondary | ICD-10-CM | POA: Diagnosis not present

## 2018-05-23 DIAGNOSIS — E1151 Type 2 diabetes mellitus with diabetic peripheral angiopathy without gangrene: Secondary | ICD-10-CM | POA: Diagnosis not present

## 2018-05-23 DIAGNOSIS — D689 Coagulation defect, unspecified: Secondary | ICD-10-CM

## 2018-05-23 DIAGNOSIS — B351 Tinea unguium: Secondary | ICD-10-CM | POA: Diagnosis not present
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
## 2018-05-24 ENCOUNTER — Other Ambulatory Visit: Payer: Self-pay | Admitting: Family Medicine

## 2018-05-24 ENCOUNTER — Ambulatory Visit (INDEPENDENT_AMBULATORY_CARE_PROVIDER_SITE_OTHER): Payer: Medicare Other | Admitting: Sports Medicine

## 2018-05-24 ENCOUNTER — Encounter: Payer: Self-pay | Admitting: Sports Medicine

## 2018-05-24 VITALS — BP 134/70 | HR 57 | Ht 73.0 in | Wt 185.4 lb

## 2018-05-24 DIAGNOSIS — I2510 Atherosclerotic heart disease of native coronary artery without angina pectoris: Secondary | ICD-10-CM

## 2018-05-24 DIAGNOSIS — M549 Dorsalgia, unspecified: Secondary | ICD-10-CM | POA: Diagnosis not present

## 2018-05-24 DIAGNOSIS — M546 Pain in thoracic spine: Secondary | ICD-10-CM | POA: Diagnosis not present

## 2018-05-24 DIAGNOSIS — M542 Cervicalgia: Secondary | ICD-10-CM | POA: Diagnosis not present

## 2018-05-24 DIAGNOSIS — G8929 Other chronic pain: Secondary | ICD-10-CM

## 2018-05-24 MED ORDER — PREDNISONE 20 MG PO TABS: ORAL_TABLET | ORAL | 0 refills | Status: DC

## 2018-05-24 NOTE — Progress Notes (Signed)
Sean Lane. Sean Lane, [place] at [place]  ANDRW Lane - 84 y.o. male MRN [id number]  Date of birth: November 11, 1927  Visit Date: 05/24/2018  PCP: Sean Olp, MD   Referred by: Sean Olp, MD  Scribe(s) for today's visit: Sean Lane, LAT, ATC  SUBJECTIVE:  Sean Dare "Till" is here for Follow-up (back pain) .  Referred by: Dr. Garret Lane  His back pain symptoms INITIALLY: Began several years ago and has worsened over the past 1-1.5 years. Last night he could barely get into bed.  Described as moderate aching (like a pulled muscle), nonradiating. Pain can be severe at times. He reports sharp pain when coughing or sneezing. Worsened with lying down at night and first thing in the morning.  Improved with movement Additional associated symptoms include: Pain is worst just between the shoulder blades.     At this time symptoms are worsening compared to onset, pain was so severe last night that he was almost in tears.  He had been prescribed steroid dosepack which helped with neck pain but not with mid pain. He takes 2 Tylenol when he wakes from sleep (around 3:15 AM).  and that gives him some relief, enough to sleep through the night.   05/24/2018: Compared to the last office visit, his previously described back pain symptoms are worsening  Slightly.  Pt states that his back pain is affecting his sleep and notes that the neck pain is affecting his driving. Current symptoms are moderate & are nonradiating.  He notes pain between his shoulder blades and is also having issues w/ his neck He has been taking Tylenol. He states that he has been doing his HEP 5-6 days/week.  REVIEW OF SYSTEMS: Reports night time disturbances. Denies fevers, chills, or night sweats. Denies unexplained weight loss. Denies personal history of cancer. Denies changes in bowel or bladder habits. Reports recent unreported falls,  fell twice in one day about 1 yr ago, PCP may not be aware. . Denies new or worsening dyspnea or wheezing. Denies headaches or dizziness.  Denies numbness, tingling or weakness  In the extremities.  Denies dizziness or presyncopal episodes Denies lower extremity edema   HISTORY:  Prior history reviewed and updated per electronic medical record.  Social History   Occupational History    Employer: RETIRED  Tobacco Use  . Smoking status: Former Smoker    Last attempt to quit: 09/11/1961    Years since quitting: 56.7  . Smokeless tobacco: Never Used  Substance and Sexual Activity  . Alcohol use: No  . Drug use: No  . Sexual activity: Yes   Social History   Social History Narrative  . Not on file    DATA OBTAINED & REVIEWED:   Recent Labs    06/05/17 1202 12/05/17 1114  HGBA1C 5.9 5.8   .   OBJECTIVE:  VS:  HT:6\' 1"  (185.4 cm)   WT:185 lb 6.4 oz (84.1 kg)  BMI:24.47    BP:134/70  HR:(!) 57bpm  TEMP: ( )  RESP:97 %   PHYSICAL EXAM: CONSTITUTIONAL: Well-developed, Well-nourished and In no acute distress Psychiatric: Alert & appropriately interactive. and Not depressed or anxious appearing. Respiratory: No increased work of breathing.  Trachea Midline EYES: Pupils are equal., EOM intact without nystagmus. and No scleral icterus.  Upper EXTREMITY EXAM: Warm and well perfused NEURO: unremarkable Normal associated myotomal distribution strength to manual muscle testing Normal sensation to light touch  MSK Exam: Cervical spine, lumbar spine  . Thoracic curvature to the right.  He has tenderness over the periscapular muscles.  Anterior cervical carriage.  He has restricted cervical sidebending and rotation worse to the right than the left. . No focal bony tenderness . No overlying skin changes.   NEURAL TENSION SIGNS Right Left  Brachial Plexus Squeeze: normal, no pain normal, no pain  Arm Squeeze Test: normal, no pain normal, no pain  Spurling's Compression  Test: positive, moderate pain positive, moderate pain  Lhermitte's Compression test: Positive, radiates [id number] thoracic spine but does not radiate     ASSESSMENT   1. Upper back pain   2. Neck pain   3. Chronic midline thoracic back pain     PROCEDURES  None  PLAN:   Meds ordered this encounter  Medications  . DISCONTD: predniSONE (DELTASONE) 20 MG tablet    Sig: Take 2tabs PO QAM x 4days, 1 tabs PO QAM x 4days, 0.5 tab PO QAM x 4days    Dispense:  21 tablet    Refill:  0  . predniSONE (DELTASONE) 20 MG tablet    Sig: Take 2tabs PO QAM x 4days, 1 tabs PO QAM x 4days, 0.5 tab PO QAM x 4days    Dispense:  14 tablet    Refill:  0    Pertinent additional documentation may be included in corresponding procedure notes, imaging studies, problem based documentation and patient instructions.  Continue previously prescribed home exercise program.   Discussed red flag symptoms that warrant earlier emergent evaluation and patient voices understanding.  Activity modifications and the importance of avoiding exacerbating activities (limiting pain to no more than a 4 / 10 during or following activity) recommended and discussed.  Discussed appropriate use of both heat and ice with the patient today.   >50% of this 25 minutes minute visit spent in direct patient counseling and/or coordination of care. Discussion was focused on education regarding the in discussing the pathoetiology and anticipated clinical course of the above condition.  Follow-up: Return in about 3 weeks (around 06/14/2018) for repeat clinical exam.   If any lack of improvement consider further diagnostic evaluation with MRI of the cervical spine to consider epidural steroid injection.  This was briefly discussed but he would prefer to continue with conservative measures.  Side effects of steroids discussed the given the overall good control A1c prolonged steroid taper may provide him the benefit he is looking for.      CMA/ATC served as Education administrator during this visit. History, Physical, and Plan performed by medical provider.  Documentation and orders reviewed and attested to.      Sean Lane, [facility] Medicine Physician

## 2018-05-25 ENCOUNTER — Encounter: Payer: Self-pay | Admitting: Sports Medicine

## 2018-06-03 ENCOUNTER — Telehealth: Payer: Self-pay | Admitting: *Deleted

## 2018-06-03 NOTE — Telephone Encounter (Signed)
Attempted to contact patient. Left voicemail requesting call back.

## 2018-06-03 NOTE — Telephone Encounter (Signed)
Patients wife [name] states that he started 10 Prednisone last week with instructions: 4tabsx4 days, 2 tabsx4 days and 1tabx4 days. States he is on the last day of that schedule. Today they received [facility] mail order of 20 mg Prednisone. I advised them not to start the new prescription until we clear that with Dr Paulla Fore. Reviewing the chart is appears that the [facility] mail order prescription was cancelled. Possible duplicate? Please review and return call to patient with further instructions.

## 2018-06-03 NOTE — Telephone Encounter (Signed)
Copied from [place] [id number]. Topic: General - Other >> Jun 03, 2018  1:28 PM Mcneil, Ja-Kwan wrote: Reason for CRM: Pt spouse [name] states she has questions regarding pt Rx for predniSONE (DELTASONE) 20 MG tablet. [name] requests a call back from Dr Nicolasa Ducking nurse. Cb# [phone]

## 2018-06-04 NOTE — Telephone Encounter (Signed)
Prescription was for a single 12-day taper.  I believe it was sent incorrectly to the mail order pharmacy and then duplicate order was sent to local.  He should only take the single 12-day dose.

## 2018-06-04 NOTE — Telephone Encounter (Signed)
Looks like rx was sent to mail order and did not get canceled. He should only complete the 1st steroid taper. Called pt and left VM to call the office.

## 2018-06-04 NOTE — Telephone Encounter (Signed)
Noted, pt finished Pred taper today, he will not take the other prescription. He will keep scheduled f/u.

## 2018-06-04 NOTE — Telephone Encounter (Signed)
Spoke with wife and advised of below. Reviewed OV note again -  " Side effects of steroids discussed the given the overall good control A1c prolonged steroid taper may provide him the benefit he is looking for."  Forwarding to Dr. Paulla Fore to confirm length of Pred taper.

## 2018-06-07 ENCOUNTER — Encounter: Payer: Self-pay | Admitting: Family Medicine

## 2018-06-07 ENCOUNTER — Ambulatory Visit (INDEPENDENT_AMBULATORY_CARE_PROVIDER_SITE_OTHER): Payer: Medicare Other | Admitting: Family Medicine

## 2018-06-07 VITALS — BP 132/80 | HR 67 | Temp 98.0°F | Ht 73.0 in | Wt 181.8 lb

## 2018-06-07 DIAGNOSIS — I4892 Unspecified atrial flutter: Secondary | ICD-10-CM | POA: Diagnosis not present

## 2018-06-07 DIAGNOSIS — R351 Nocturia: Secondary | ICD-10-CM

## 2018-06-07 DIAGNOSIS — N401 Enlarged prostate with lower urinary tract symptoms: Secondary | ICD-10-CM

## 2018-06-07 DIAGNOSIS — I2510 Atherosclerotic heart disease of native coronary artery without angina pectoris: Secondary | ICD-10-CM | POA: Diagnosis not present

## 2018-06-07 DIAGNOSIS — E119 Type 2 diabetes mellitus without complications: Secondary | ICD-10-CM | POA: Diagnosis not present

## 2018-06-07 DIAGNOSIS — Z23 Encounter for immunization: Secondary | ICD-10-CM

## 2018-06-07 DIAGNOSIS — I10 Essential (primary) hypertension: Secondary | ICD-10-CM

## 2018-06-07 DIAGNOSIS — E785 Hyperlipidemia, unspecified: Secondary | ICD-10-CM | POA: Diagnosis not present

## 2018-06-07 LAB — CBC
HCT: 45.4 % (ref 39.0–52.0)
Hemoglobin: 15.8 g/dL (ref 13.0–17.0)
MCHC: 34.8 g/dL (ref 30.0–36.0)
MCV: 90.9 fl (ref 78.0–100.0)
PLATELETS: 153 10*3/uL (ref 150.0–400.0)
RBC: 4.99 Mil/uL (ref 4.22–5.81)
RDW: 14.3 % (ref 11.5–15.5)
WBC: 6.7 10*3/uL (ref 4.0–10.5)

## 2018-06-07 LAB — HEMOGLOBIN A1C: Hgb A1c MFr Bld: 6.3 % (ref 4.6–6.5)

## 2018-06-07 LAB — COMPREHENSIVE METABOLIC PANEL
ALBUMIN: 4.4 g/dL (ref 3.5–5.2)
ALK PHOS: 81 U/L (ref 39–117)
ALT: 15 U/L (ref 0–53)
AST: 12 U/L (ref 0–37)
BILIRUBIN TOTAL: 1 mg/dL (ref 0.2–1.2)
BUN: 17 mg/dL (ref 6–23)
CALCIUM: 9.7 mg/dL (ref 8.4–10.5)
CO2: 29 mEq/L (ref 19–32)
Chloride: 103 mEq/L (ref 96–112)
Creatinine, Ser: 0.99 mg/dL (ref 0.40–1.50)
GFR: 75.83 mL/min (ref >60.00)
Glucose, Bld: 111 mg/dL — ABNORMAL HIGH (ref 70–99)
Potassium: 4.6 mEq/L (ref 3.5–5.1)
Sodium: 139 mEq/L (ref 135–145)
TOTAL PROTEIN: 6.4 g/dL (ref 6.0–8.3)

## 2018-06-07 LAB — LIPID PANEL
Cholesterol: 113 mg/dL (ref 0–200)
HDL: 44.3 mg/dL (ref >39.00)
LDL Cholesterol: 47 mg/dL (ref 0–99)
NonHDL: 68.45
TRIGLYCERIDES: 106 mg/dL (ref 0.0–149.0)
Total CHOL/HDL Ratio: 3
VLDL: 21.2 mg/dL (ref 0.0–40.0)

## 2018-06-07 NOTE — Assessment & Plan Note (Signed)
S: diet controlled. Down over 10 lbs Lab Results  Component Value Date   HGBA1C 5.8 12/05/2017   HGBA1C 5.9 06/05/2017   HGBA1C 5.5 10/20/2016   A/P: update a1c

## 2018-06-07 NOTE — Assessment & Plan Note (Signed)
Compliant with xarelto. Has seemed to maintain sinus rhythm. No rate control.

## 2018-06-07 NOTE — Progress Notes (Signed)
Subjective:  Sean Lane is a 84 y.o. year old very pleasant male patient who presents for/with See problem oriented charting ROS- No chest pain or shortness of breath. No headache or blurry vision. Upper to mid back pain improved   Past Medical History-  Patient Active Problem List   Diagnosis Date Noted  . Diabetes ([place]) 12/14/2012    Priority: High  . Atrial flutter ([place]) 10/03/2011    Priority: High  . CAD (coronary artery disease) 12/17/2007    Priority: High  . SINUS BRADYCARDIA 07/20/2010    Priority: Medium  . BPH associated with nocturia 06/08/2009    Priority: Medium  . DIZZINESS 12/15/2008    Priority: Medium  . Hyperlipidemia 04/02/2007    Priority: Medium  . Essential hypertension 04/02/2007    Priority: Medium  . Nephrolithiasis 05/07/2014    Priority: Low  . Gout attack 12/14/2012    Priority: Low  . Hematuria 03/28/2012    Priority: Low  . RBBB 12/28/2008    Priority: Low  . GERD 12/15/2008    Priority: Low  . LOSS, CONDUCTIVE HEARING, COMBINED TYPE 04/16/2007    Priority: Low  . Allergic rhinitis 04/02/2007    Priority: Low  . OA (osteoarthritis) of knee 04/02/2007    Priority: Low  . Thrombocytopenia ([place]) 12/04/2016  . Abnormal nuclear stress test 10/26/2016  . Preoperative cardiovascular examination 10/26/2016  . CAD S/P percutaneous coronary angioplasty 10/26/2016  . Diverticulitis of colon 11/22/2015    Medications- reviewed and updated Current Outpatient Medications  Medication Sig Dispense Refill  . acetaminophen (TYLENOL) 650 MG CR tablet Take 1,300 mg by mouth every 8 (eight) hours as needed for pain.    Marland Kitchen aspirin EC 81 MG tablet Take 81 mg by mouth daily.    Marland Kitchen CREAM BASE EX Apply 1 application topically at bedtime. Diabetics' Dry Skin Relief (applied to feet)    . dutasteride (AVODART) 0.5 MG capsule Take 1 capsule (0.5 mg total) by mouth daily. 90 capsule 1  . FREESTYLE INSULINX TEST test strip E11.9 USE TO CHECK BLOOD SUGAR  DAILY AND AS NEEDED 100 each 4  . Lancets (FREESTYLE) lancets E11.9 -CHECK BLOOD SUGAR ONCE DAILY 100 each 4  . losartan (COZAAR) 25 MG tablet Take 1 tablet (25 mg total) by mouth daily. 30 tablet 11  . Naphazoline-Pheniramine (OPCON-A OP) Apply 1 drop to eye daily as needed (allergies).    . nitroGLYCERIN (NITROSTAT) 0.4 MG SL tablet Place 1 tablet (0.4 mg total) under the tongue every 5 (five) minutes as needed for chest pain (for chest pain). 25 tablet 3  . NON FORMULARY [facility]  Peripheral Neuropathy Cream- Bupivacaine 1%, Doxepin 3%, Gabapentin 6%, Pentoxifylline 3%, Topiramate 1% Apply 1-2 grams to affected area 3-4 times daily Qty. 120 gm 3 refills    . NONFORMULARY OR COMPOUNDED ITEM Apply 1 application topically 3 (three) times daily as needed (for itchy/irritated skin). CLOBETASOL 0.05% MIX WITH CETAPHIL    . Omega-3 Fatty Acids (FISH OIL) 1200 MG CAPS Take 2 capsules by mouth 2 (two) times daily.    Marland Kitchen oxymetazoline (AFRIN) 0.05 % nasal spray Place 1 spray into both nostrils 2 (two) times daily as needed for congestion.    . rosuvastatin (CRESTOR) 10 MG tablet TAKE 1 TABLET DAILY 90 tablet 3  . tamsulosin (FLOMAX) 0.4 MG CAPS capsule TAKE 1 CAPSULE DAILY 90 capsule 1  . XARELTO 20 MG TABS tablet TAKE 1 TABLET DAILY WITH   SUPPER 90 tablet 0  No current facility-administered medications for this visit.     Objective: BP 132/80 (BP Location: Left Arm, Patient Position: Sitting, Cuff Size: Normal)   Pulse 67   Temp 98 F (36.7 C) (Oral)   Ht 6\' 1"  (1.854 m)   Wt 181 lb 12.8 oz (82.5 kg)   SpO2 96%   BMI 23.99 kg/m  Gen: NAD, resting comfortably CV: RRR no murmurs rubs or gallops Lungs: CTAB no crackles, wheeze, rhonchi Abdomen: soft/nontender/nondistended/normal bowel sounds. Ext: no edema Skin: warm, dry Neuro: grossly normal, moves all extremities  Assessment/Plan:  Other notes: 1.no upper back pain on prednisone. Just came off yesterday- hoping this  persists  2. Has lost some weight trying to fit in some clothes for a wedding. He has cut out all snacking and that has helped tremendously 3. Just had new greatgrandbaby girl! Also has a wedding in November for granddaughter he is excited about   BPH associated with nocturia S: Patient's BPH symptoms have been reasonably controlled on Flomax and dutasteride other than 3-4x a night nocturia.  A/P: Continue current Rx. I am sending in rx but he has planned follow up with new urologist- had been seeing Dr. Risa Grill- may need some tweaks given worsening nocturia. Hopeful to avoid orthostatic symptoms   Essential hypertension S: controlled on losartan 25 mg per Dr. Johnsie Cancel BP Readings from Last 3 Encounters:  06/07/18 132/80  05/24/18 134/70  04/25/18 (!) 114/58  A/P: We discussed blood pressure goal of <140/90. Continue current meds  Hyperlipidemia S: well controlled on crestor 10mg  Lab Results  Component Value Date   CHOL 122 04/09/2014   HDL 49.70 04/09/2014   LDLCALC 54 04/09/2014   LDLDIRECT 54.0 06/05/2017   TRIG 93.0 04/09/2014   CHOLHDL 2 04/09/2014   A/P: Update lipid panel today   Diabetes ([place]) S: diet controlled. Down over 10 lbs Lab Results  Component Value Date   HGBA1C 5.8 12/05/2017   HGBA1C 5.9 06/05/2017   HGBA1C 5.5 10/20/2016   A/P: update a1c    Atrial flutter Compliant with xarelto. Has seemed to maintain sinus rhythm. No rate control.    Future Appointments  Date Time Provider [place]  06/19/2018  1:20 PM Gerda Diss, DO [facility]  07/25/2018  3:15 PM Evelina Bucy, DPM [facility]  09/19/2018  1:00 PM [facility] HEALTH COACH [facility]   6 months  Lab/Order associations: 90 calorie bar this AM, also had some creamer Encounter for immunization - Plan: Flu vaccine HIGH DOSE PF  BPH associated with nocturia  Essential hypertension  Hyperlipidemia, unspecified hyperlipidemia type  Type 2 diabetes mellitus without  complication, without long-term current use of insulin ([facility])  Atrial flutter, unspecified type ([place])   Return precautions advised.  [name], MD

## 2018-06-07 NOTE — Assessment & Plan Note (Signed)
S: well controlled on crestor 10mg  Lab Results  Component Value Date   CHOL 122 04/09/2014   HDL 49.70 04/09/2014   LDLCALC 54 04/09/2014   LDLDIRECT 54.0 06/05/2017   TRIG 93.0 04/09/2014   CHOLHDL 2 04/09/2014   A/P: Update lipid panel today

## 2018-06-07 NOTE — Patient Instructions (Addendum)
You are doing great! A+ today! Keep up the good work   Please stop by lab before you go

## 2018-06-07 NOTE — Assessment & Plan Note (Signed)
S: controlled on losartan 25 mg per Dr. Johnsie Cancel BP Readings from Last 3 Encounters:  06/07/18 132/80  05/24/18 134/70  04/25/18 (!) 114/58  A/P: We discussed blood pressure goal of <140/90. Continue current meds

## 2018-06-07 NOTE — Assessment & Plan Note (Addendum)
S: Patient's BPH symptoms have been reasonably controlled on Flomax and dutasteride other than 3-4x a night nocturia.  A/P: Continue current Rx. I am sending in rx but he has planned follow up with new urologist- had been seeing Dr. Risa Grill- may need some tweaks given worsening nocturia. Hopeful to avoid orthostatic symptoms

## 2018-06-17 ENCOUNTER — Ambulatory Visit: Payer: Medicare Other | Admitting: Family Medicine

## 2018-06-19 ENCOUNTER — Ambulatory Visit (INDEPENDENT_AMBULATORY_CARE_PROVIDER_SITE_OTHER): Payer: Medicare Other | Admitting: Sports Medicine

## 2018-06-19 ENCOUNTER — Encounter: Payer: Self-pay | Admitting: Sports Medicine

## 2018-06-19 VITALS — BP 110/60 | HR 65 | Ht 73.0 in | Wt 182.2 lb

## 2018-06-19 DIAGNOSIS — M4722 Other spondylosis with radiculopathy, cervical region: Secondary | ICD-10-CM

## 2018-06-19 DIAGNOSIS — M546 Pain in thoracic spine: Secondary | ICD-10-CM | POA: Diagnosis not present

## 2018-06-19 DIAGNOSIS — G8929 Other chronic pain: Secondary | ICD-10-CM | POA: Diagnosis not present

## 2018-06-19 DIAGNOSIS — I2510 Atherosclerotic heart disease of native coronary artery without angina pectoris: Secondary | ICD-10-CM

## 2018-06-19 DIAGNOSIS — M542 Cervicalgia: Secondary | ICD-10-CM | POA: Diagnosis not present

## 2018-06-19 LAB — C-REACTIVE PROTEIN: CRP: 0.7 mg/dL (ref 0.5–20.0)

## 2018-06-19 LAB — SEDIMENTATION RATE: Sed Rate: 13 mm/hr (ref 0–20)

## 2018-06-19 MED ORDER — PREDNISONE 20 MG PO TABS: ORAL_TABLET | ORAL | 0 refills | Status: DC

## 2018-06-19 NOTE — Progress Notes (Signed)
Sean Lane. Sean Lane, [place] at [place]  Sean Lane - 84 y.o. male MRN [id number]  Date of birth: Nov 11, 1927  Visit Date: 06/19/2018  PCP: [name], MD   Referred by: [name], MD  Scribe(s) for today's visit: Wendy Poet, LAT, ATC  SUBJECTIVE:  Sean Dare "Till" is here for Follow-up (neck and back pain) .  Referred by: Dr. [name]  His back pain symptoms INITIALLY: 04/25/2018: Began several years ago and has worsened over the past 1-1.5 years. Last night he could barely get into bed.  Described as moderate aching (like a pulled muscle), nonradiating. Pain can be severe at times. He reports sharp pain when coughing or sneezing. Worsened with lying down at night and first thing in the morning.  Improved with movement Additional associated symptoms include: Pain is worst just between the shoulder blades.    At this time symptoms are worsening compared to onset, pain was so severe last night that he was almost in tears.  He had been prescribed steroid dosepack which helped with neck pain but not with mid pain. He takes 2 Tylenol when he wakes from sleep (around 3:15 AM).  and that gives him some relief, enough to sleep through the night.   05/24/2018: Compared to the last office visit, his previously described back pain symptoms are worsening  Slightly.  Pt states that his back pain is affecting his sleep and notes that the neck pain is affecting his driving. Current symptoms are moderate & are nonradiating.  He notes pain between his shoulder blades and is also having issues w/ his neck He has been taking Tylenol. He states that he has been doing his HEP 5-6 days/week.  06/19/2018: Compared to the last office visit, his previously described symptoms are improving. He still has some pain. Current symptoms are moderate & are nonradiating. Sx are 7/10 when first getting up but decreased  to about 2/10 after moving around.  He has taken Prednisone in the past and reports that sx improve a lot while on Prednisone. Sx have been getting progressively worse since stopping Prednisone but he still feels better than he did before starting it. He takes Tylenol prn with some relief. He stopped doing HEP after his last visit, he didn't know if he needed to continue.    REVIEW OF SYSTEMS: Reports night time disturbances. Denies fevers, chills, or night sweats. Denies unexplained weight loss. Denies personal history of cancer. Denies changes in bowel or bladder habits. Reports 1 fall since his last visit, tripped going up stairs.   Denies new or worsening dyspnea or wheezing. Denies headaches or dizziness.  Denies numbness, tingling or weakness  In the extremities.  Denies dizziness or presyncopal episodes Denies lower extremity edema   HISTORY:  Prior history reviewed and updated per electronic medical record.  Social History   Occupational History    Employer: RETIRED  Tobacco Use  . Smoking status: Former Smoker    Last attempt to quit: 09/11/1961    Years since quitting: 56.8  . Smokeless tobacco: Never Used  Substance and Sexual Activity  . Alcohol use: No  . Drug use: No  . Sexual activity: Yes   Social History   Social History Narrative  . Not on file    DATA OBTAINED & REVIEWED:   Recent Labs    12/05/17 1114 06/07/18 1043  HGBA1C 5.8 6.3   .   OBJECTIVE:  VS:  HT:6\' 1"  (185.4 cm)   WT:182 lb 3.2 oz (82.6 kg)  BMI:24.04    BP:110/60  HR:65bpm  TEMP: ( )  RESP:96 %   PHYSICAL EXAM: CONSTITUTIONAL: Well-developed, Well-nourished and In no acute distress Psychiatric: Alert & appropriately interactive. and Not depressed or anxious appearing. Respiratory: No increased work of breathing.  Trachea Midline EYES: Pupils are equal., EOM intact without nystagmus. and No scleral icterus.  Upper EXTREMITY EXAM: EXTREMITY EXAM: Warm and well  perfused NEURO: unremarkable Normal associated myotomal distribution strength to manual muscle testing Normal sensation to light touch  MSK Exam: Cervical spine, lumbar spine  . Thoracic curvature to the right.  He has tenderness over the periscapular muscles.  Anterior cervical carriage.  He has restricted cervical sidebending and rotation worse to the right than the left. . No focal bony tenderness . No overlying skin changes.   NEURAL TENSION SIGNS Right Left  Brachial Plexus Squeeze: normal, no pain normal, no pain  Arm Squeeze Test: normal, no pain normal, no pain  Spurling's Compression Test: positive, moderate pain positive, moderate pain  Lhermitte's Compression test: Negative, no radiating pain     ASSESSMENT   1. Neck pain   2. Chronic midline thoracic back pain   3. Osteoarthritis of spine with radiculopathy, cervical region     PROCEDURES  None  PLAN:   Meds ordered this encounter  Medications  . predniSONE (DELTASONE) 20 MG tablet    Sig: Take 1 tablet po q day with breakfast as directed    Dispense:  30 tablet    Refill:  0    Pertinent additional documentation may be included in corresponding procedure notes, imaging studies, problem based documentation and patient instructions.  He had almost complete resolution of symptoms while steroid.  We did discuss the possibility of continuing steroid treatment for the possibility of underlying polymyalgia rheumatica versus using bursting steroids if normal inflammatory markers.  He does understand the risks and benefits of chronic steroid use and is willing to undergo this given the significant improvement that he had.  Continue previously prescribed home exercise program.   Discussed red flag symptoms that warrant earlier emergent evaluation and patient voices understanding.  Activity modifications and the importance of avoiding exacerbating activities (limiting pain to no more than a 4 / 10 during or following  activity) recommended and discussed.  Discussed appropriate use of both heat and ice with the patient today.   >50% of this 25 minutes minute visit spent in direct patient counseling and/or coordination of care. Discussion was focused on education regarding the in discussing the pathoetiology and anticipated clinical course of the above condition.  Follow-up: Return in about 6 weeks (around 07/31/2018).   will need blood work if he is going to continue steroid use.      CMA/ATC served as Education administrator during this visit. History, Physical, and Plan performed by medical provider.  Documentation and orders reviewed and attested to.      Gerda Diss, [facility] Physician

## 2018-06-21 ENCOUNTER — Encounter: Payer: Self-pay | Admitting: Sports Medicine

## 2018-06-24 NOTE — Progress Notes (Signed)
My chart message: Lab work is reassuring that you do not have any significant inflammation going on that are causing this.  I would like for you to take prednisone intermittently for 4 to 5 days a time if needed but he will need at least 2 weeks of a break between each of these of the bursts.  If you have any worsening please let us know we can discuss alternative dosing options.

## 2018-07-09 ENCOUNTER — Encounter: Payer: Self-pay | Admitting: Family Medicine

## 2018-07-10 ENCOUNTER — Other Ambulatory Visit: Payer: Self-pay

## 2018-07-10 MED ORDER — DUTASTERIDE 0.5 MG PO CAPS: 0.5000 mg | ORAL_CAPSULE | Freq: Every day | ORAL | 3 refills | Status: DC

## 2018-07-11 ENCOUNTER — Other Ambulatory Visit: Payer: Self-pay | Admitting: Sports Medicine

## 2018-07-22 ENCOUNTER — Other Ambulatory Visit: Payer: Self-pay | Admitting: Cardiovascular Disease

## 2018-07-25 ENCOUNTER — Ambulatory Visit (INDEPENDENT_AMBULATORY_CARE_PROVIDER_SITE_OTHER): Payer: Medicare Other | Admitting: Podiatry

## 2018-07-25 ENCOUNTER — Encounter: Payer: Self-pay | Admitting: Podiatry

## 2018-07-25 DIAGNOSIS — B351 Tinea unguium: Secondary | ICD-10-CM

## 2018-07-25 DIAGNOSIS — M79675 Pain in left toe(s): Secondary | ICD-10-CM | POA: Diagnosis not present

## 2018-07-25 DIAGNOSIS — M79674 Pain in right toe(s): Secondary | ICD-10-CM

## 2018-07-25 DIAGNOSIS — E1151 Type 2 diabetes mellitus with diabetic peripheral angiopathy without gangrene: Secondary | ICD-10-CM

## 2018-07-25 DIAGNOSIS — Z9229 Personal history of other drug therapy: Secondary | ICD-10-CM

## 2018-07-25 NOTE — Patient Instructions (Signed)

## 2018-07-31 ENCOUNTER — Ambulatory Visit (INDEPENDENT_AMBULATORY_CARE_PROVIDER_SITE_OTHER): Payer: Medicare Other | Admitting: Sports Medicine

## 2018-07-31 ENCOUNTER — Encounter: Payer: Self-pay | Admitting: Sports Medicine

## 2018-07-31 VITALS — BP 124/58 | HR 60 | Ht 73.0 in | Wt 186.0 lb

## 2018-07-31 DIAGNOSIS — M542 Cervicalgia: Secondary | ICD-10-CM | POA: Diagnosis not present

## 2018-07-31 DIAGNOSIS — I2510 Atherosclerotic heart disease of native coronary artery without angina pectoris: Secondary | ICD-10-CM

## 2018-07-31 DIAGNOSIS — G8929 Other chronic pain: Secondary | ICD-10-CM

## 2018-07-31 DIAGNOSIS — M546 Pain in thoracic spine: Secondary | ICD-10-CM | POA: Diagnosis not present

## 2018-07-31 DIAGNOSIS — M4722 Other spondylosis with radiculopathy, cervical region: Secondary | ICD-10-CM

## 2018-07-31 NOTE — Progress Notes (Signed)
[name] , [place] at [place]  Sean Lane - 84 y.o. male MRN [id number]  Date of birth: March 27, 1928  Visit Date: 07/31/2018  PCP: [name], MD   Referred by: [name], MD  Scribe(s) for today's visit: Wendy Poet, LAT, ATC  SUBJECTIVE:  Sean Dare "Til" is here for Follow-up (neck and back pain) .  Referred by: Dr. [name]   His back pain symptoms INITIALLY: 04/25/2018: Began several years ago and has worsened over the past 1-1.5 years. Last night he could barely get into bed.  Described as moderate aching (like a pulled muscle), nonradiating. Pain can be severe at times. He reports sharp pain when coughing or sneezing. Worsened with lying down at night and first thing in the morning.  Improved with movement Additional associated symptoms include: Pain is worst just between the shoulder blades.    At this time symptoms are worsening compared to onset, pain was so severe last night that he was almost in tears.  He had been prescribed steroid dosepack which helped with neck pain but not with mid pain. He takes 2 Tylenol when he wakes from sleep (around 3:15 AM).  and that gives him some relief, enough to sleep through the night.   05/24/2018: Compared to the last office visit, his previously described back pain symptoms are worsening  Slightly.  Pt states that his back pain is affecting his sleep and notes that the neck pain is affecting his driving. Current symptoms are moderate & are nonradiating.  He notes pain between his shoulder blades and is also having issues w/ his neck He has been taking Tylenol. He states that he has been doing his HEP 5-6 days/week.  06/19/2018: Compared to the last office visit, his previously described symptoms are improving. He still has some pain. Current symptoms are moderate & are nonradiating. Sx are 7/10 when first getting up but decreased  to about 2/10 after moving around.  He has taken Prednisone in the past and reports that sx improve a lot while on Prednisone. Sx have been getting progressively worse since stopping Prednisone but he still feels better than he did before starting it. He takes Tylenol prn with some relief. He stopped doing HEP after his last visit, he didn't know if he needed to continue.   07/31/2018: Compared to the last office visit on 06/19/18, his previously described mid-back symptoms are improving.  He states that the prednisone is really helping.  He states that he has had some muscle spasms since his last visit but overall is he is improved. Current symptoms are mild & are nonradiating He has been taking Prednisone 20mg  in burst format and started his most recent dose yesterday.  He is taking Tylenol prn.   REVIEW OF SYSTEMS: Reports night time disturbances. Denies fevers, chills, or night sweats. Denies unexplained weight loss. Denies personal history of cancer. Denies changes in bowel or bladder habits. Denies any falls since his last visit.   Denies new or worsening dyspnea or wheezing. Denies headaches or dizziness.  Denies numbness, tingling or weakness  In the extremities.  Denies dizziness or presyncopal episodes Denies lower extremity edema   HISTORY:  Prior history reviewed and updated per electronic medical record.  Social History   Occupational History    Employer: RETIRED  Tobacco Use  . Smoking status: Former Smoker    Last attempt to quit: 09/11/1961  Years since quitting: 56.9  . Smokeless tobacco: Never Used  Substance and Sexual Activity  . Alcohol use: No  . Drug use: No  . Sexual activity: Yes   Social History   Social History Narrative  . Not on file    DATA OBTAINED & REVIEWED:   Recent Labs    12/05/17 1114 06/07/18 1043  HGBA1C 5.8 6.3   .   OBJECTIVE:  VS:  HT:6\' 1"  (185.4 cm)   WT:186 lb (84.4 kg)  BMI:24.55    BP:(!) 124/58  HR:60bpm  TEMP:  ( )  RESP:95 %   PHYSICAL EXAM: CONSTITUTIONAL: Well-developed, Well-nourished and In no acute distress Psychiatric: Alert & appropriately interactive. and Not depressed or anxious appearing. Respiratory: No increased work of breathing.  Trachea Midline EYES: Pupils are equal., EOM intact without nystagmus. and No scleral icterus.  Upper EXTREMITY EXAM: EXTREMITY EXAM: Warm and well perfused NEURO: unremarkable Normal associated myotomal distribution strength to manual muscle testing Normal sensation to light touch  MSK Exam: Cervical spine, lumbar spine  . Thoracic curvature to the right.  He has tenderness over the periscapular muscles.  Anterior cervical carriage.  He has restricted cervical sidebending and rotation worse to the right than the left. . No focal bony tenderness . No overlying skin changes.   NEURAL TENSION SIGNS Right Left  Brachial Plexus Squeeze: normal, no pain normal, no pain  Arm Squeeze Test: normal, no pain normal, no pain  Spurling's Compression Test: positive, moderate pain positive, moderate pain  Lhermitte's Compression test: Negative, no radiating pain     ASSESSMENT   1. Neck pain   2. Chronic midline thoracic back pain   3. Osteoarthritis of spine with radiculopathy, cervical region     PLAN:  Pertinent additional documentation may be included in corresponding procedure notes, imaging studies, problem based documentation and patient instructions.  Procedures:  . None  Medications:  No orders of the defined types were placed in this encounter.  Discussion/Instructions: No problem-specific Assessment & Plan notes found for this encounter.  Marland Kitchen Ultimately done exceptionally well with intermittent burst of steroids to the point that he is only had to take down well to the past 2 months.  He reports was complete resolution of symptoms with steroids.  We will plan to continue intermittent bursting as tolerated he does understand the risks and  benefits associated with chronic steroid/intermittent steroid use. . If having to increase the number of steroid treatments monthly will consider repeating inflammatory arthropathy work-up versus polymyalgia rheumatica work-up. Marland Kitchen Discussed red flag symptoms that warrant earlier emergent evaluation and patient voices understanding. . Activity modifications and the importance of avoiding exacerbating activities (limiting pain to no more than a 4 / 10 during or following activity) recommended and discussed.  Follow-up:  . Return in about 6 months (around 01/29/2019) for repeat clinical exam.      CMA/ATC served as scribe during this visit. History, Physical, and Plan performed by medical provider. Documentation and orders reviewed and attested to.      Gerda Diss, [facility] Physician

## 2018-08-04 ENCOUNTER — Encounter: Payer: Self-pay | Admitting: Sports Medicine

## 2018-08-12 ENCOUNTER — Encounter: Payer: Self-pay | Admitting: Podiatry

## 2018-08-12 DIAGNOSIS — H1013 Acute atopic conjunctivitis, bilateral: Secondary | ICD-10-CM | POA: Diagnosis not present

## 2018-08-12 NOTE — Progress Notes (Signed)
Subjective: Sean Lane presents with diabetes and cc of painful, discolored, thick toenails and painful callus/corn which interfere with activities of daily living. Pain is aggravated when wearing enclosed shoe gear. Pain is relieved with periodic professional debridement.  He remains on blood thinner, Xarelto.  Sean Lane voices no new problems on today's visit.  Objective: Vascular Examination: Capillary refill time immediate x 10 digits Dorsalis pedis 1/4 b/l  Posterior tibial pulses absent b/l No digital hair x 10 digits Skin temperature gradient WNL b/l  Dermatological Examination: Skin with normal turgor, texture and tone b/l  Toenails 1-5 b/l discolored, thick, dystrophic with subungual debris and pain with palpation to nailbeds due to thickness of nails.  Musculoskeletal: Muscle strength 5/5 to all LE muscle groups  Neurological: Sensation diminished with 10 gram monofilament. Vibratory sensation diminished  Assessment: 1. Painful onychomycosis toenails 1-5 b/l 2. NIDDM with PAD 3. Pt on long term blood thinner  Plan: 1. Continue diabetic foot care principles.  2. Toenails 1-5 b/l were debrided in length and girth without iatrogenic bleeding. 3. Patient to continue soft, supportive shoe gear 4. Patient to report any pedal injuries to medical professional  5. Follow up 3 months. Patient/POA to call should there be a concern in the interim.

## 2018-08-20 DIAGNOSIS — R351 Nocturia: Secondary | ICD-10-CM | POA: Diagnosis not present

## 2018-08-20 DIAGNOSIS — N401 Enlarged prostate with lower urinary tract symptoms: Secondary | ICD-10-CM | POA: Diagnosis not present

## 2018-08-20 DIAGNOSIS — N200 Calculus of kidney: Secondary | ICD-10-CM | POA: Diagnosis not present

## 2018-08-22 ENCOUNTER — Encounter: Payer: Self-pay | Admitting: Family Medicine

## 2018-09-12 ENCOUNTER — Other Ambulatory Visit: Payer: Self-pay | Admitting: Family Medicine

## 2018-09-19 ENCOUNTER — Ambulatory Visit: Payer: Medicare Other

## 2018-10-09 ENCOUNTER — Ambulatory Visit (INDEPENDENT_AMBULATORY_CARE_PROVIDER_SITE_OTHER): Payer: Medicare Other

## 2018-10-09 VITALS — BP 134/68 | HR 58 | Temp 98.3°F | Ht 73.0 in | Wt 192.0 lb

## 2018-10-09 DIAGNOSIS — Z0000 Encounter for general adult medical examination without abnormal findings: Secondary | ICD-10-CM
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
## 2018-10-09 NOTE — Patient Instructions (Signed)
Sean Lane , Thank you for taking time to come for your Medicare Wellness Visit. I appreciate your ongoing commitment to your health goals. Please review the following plan we discussed and let me know if I can assist you in the future.   These are the goals we discussed: Goals    . Weight (lb) < 180 lb (81.6 kg)     Will count calories and carbs; Lost 1000 calories a day 2000 -2200  Will make a plan and get this completed        This is a list of the screening recommended for you and due dates:  Health Maintenance  Topic Date Due  . Complete foot exam   12/06/2018  . Hemoglobin A1C  12/06/2018  . Eye exam for diabetics  12/07/2018  . Tetanus Vaccine  10/24/2021  . Flu Shot  Completed  . Pneumonia vaccines  Completed   Preventive Care for Adults  A healthy lifestyle and preventive care can promote health and wellness. Preventive health guidelines for adults include the following key practices.  . A routine yearly physical is a good way to check with your health care provider about your health and preventive screening. It is a chance to share any concerns and updates on your health and to receive a thorough exam.  . Visit your dentist for a routine exam and preventive care every 6 months. Brush your teeth twice a day and floss once a day. Good oral hygiene prevents tooth decay and gum disease.  . The frequency of eye exams is based on your age, health, family medical history, use  of contact lenses, and other factors. Follow your health care provider's recommendations for frequency of eye exams.  . Eat a healthy diet. Foods like vegetables, fruits, whole grains, low-fat dairy products, and lean protein foods contain the nutrients you need without too many calories. Decrease your intake of foods high in solid fats, added sugars, and salt. Eat the right amount of calories for you. Get information about a proper diet from your health care provider, if necessary.  . Regular physical  exercise is one of the most important things you can do for your health. Most adults should get at least 150 minutes of moderate-intensity exercise (any activity that increases your heart rate and causes you to sweat) each week. In addition, most adults need muscle-strengthening exercises on 2 or more days a week.  Silver Sneakers may be a benefit available to you. To determine eligibility, you may visit the website: www.silversneakers.com or contact program at 985-475-6097 Mon-Fri between 8AM-8PM.   . Maintain a healthy weight. The body mass index (BMI) is a screening tool to identify possible weight problems. It provides an estimate of body fat based on height and weight. Your health care provider can find your BMI and can help you achieve or maintain a healthy weight.   For adults 20 years and older: ? A BMI below 18.5 is considered underweight. ? A BMI of 18.5 to 24.9 is normal. ? A BMI of 25 to 29.9 is considered overweight. ? A BMI of 30 and above is considered obese.   . Maintain normal blood lipids and cholesterol levels by exercising and minimizing your intake of saturated fat. Eat a balanced diet with plenty of fruit and vegetables. Blood tests for lipids and cholesterol should begin at age 25 and be repeated every 5 years. If your lipid or cholesterol levels are high, you are over 50, or you are at  high risk for heart disease, you may need your cholesterol levels checked more frequently. Ongoing high lipid and cholesterol levels should be treated with medicines if diet and exercise are not working.  . If you smoke, find out from your health care provider how to quit. If you do not use tobacco, please do not start.  . If you choose to drink alcohol, please do not consume more than 2 drinks per day. One drink is considered to be 12 ounces (355 mL) of beer, 5 ounces (148 mL) of wine, or 1.5 ounces (44 mL) of liquor.  . If you are 20-58 years old, ask your health care provider if you  should take aspirin to prevent strokes.  . Use sunscreen. Apply sunscreen liberally and repeatedly throughout the day. You should seek shade when your shadow is shorter than you. Protect yourself by wearing long sleeves, pants, a wide-brimmed hat, and sunglasses year round, whenever you are outdoors.  . Once a month, do a whole body skin exam, using a mirror to look at the skin on your back. Tell your health care provider of new moles, moles that have irregular borders, moles that are larger than a pencil eraser, or moles that have changed in shape or color.

## 2018-10-09 NOTE — Progress Notes (Signed)
PCP notes: Last OV 06/07/2018   Health maintenance: Up to Date   Abnormal screenings: MMSE score of 30   Patient concerns:  Patient has a singular focus. If he is driving somewhere and his wife and him get in a conversation he might drive past where they are going and have to turn around. Only happens when he gets distracted.   Nurse concerns:None   Next PCP appt: 12/10/2018

## 2018-10-29 ENCOUNTER — Encounter: Payer: Self-pay | Admitting: Podiatry

## 2018-10-29 ENCOUNTER — Ambulatory Visit (INDEPENDENT_AMBULATORY_CARE_PROVIDER_SITE_OTHER): Payer: Medicare Other | Admitting: Podiatry

## 2018-10-29 DIAGNOSIS — D689 Coagulation defect, unspecified: Secondary | ICD-10-CM

## 2018-10-29 DIAGNOSIS — M79675 Pain in left toe(s): Secondary | ICD-10-CM

## 2018-10-29 DIAGNOSIS — B351 Tinea unguium: Secondary | ICD-10-CM

## 2018-10-29 DIAGNOSIS — M79674 Pain in right toe(s): Secondary | ICD-10-CM

## 2018-10-29 DIAGNOSIS — E1151 Type 2 diabetes mellitus with diabetic peripheral angiopathy without gangrene: Secondary | ICD-10-CM

## 2018-10-29 NOTE — Progress Notes (Signed)
Complaint:  Visit Type: Patient returns to my office for continued preventative foot care services. Complaint: Patient states" my nails have grown long and thick and become painful to walk and wear shoes" Patient has been diagnosed with DM with no foot complications. The patient presents for preventative foot care services. No changes to ROS.  Patient is taking xarelto.  Podiatric Exam: Vascular: dorsalis pedis  pulses are palpable bilateral.Posterior tibial pulses are absent. Capillary return is immediate. Temperature gradient is WNL. Skin turgor WNL  Sensorium: Diminished  Semmes Weinstein monofilament test. Normal tactile sensation bilaterally. Nail Exam: Pt has thick disfigured discolored nails with subungual debris noted bilateral entire nail hallux through fifth toenails Ulcer Exam: There is no evidence of ulcer or pre-ulcerative changes or infection. Orthopedic Exam: Muscle tone and strength are WNL. No limitations in general ROM. No crepitus or effusions noted. Foot type and digits show no abnormalities. Bony prominences are unremarkable. Skin: No Porokeratosis. No infection or ulcers  Diagnosis:  Onychomycosis, , Pain in right toe, pain in left toes  Treatment & Plan Procedures and Treatment: Consent by patient was obtained for treatment procedures.   Debridement of mycotic and hypertrophic toenails, 1 through 5 bilateral and clearing of subungual debris. No ulceration, no infection noted.  Return Visit-Office Procedure: Patient instructed to return to the office for a follow up visit 3 months for continued evaluation and treatment.    Aziyah Provencal DPM 

## 2018-11-05 DIAGNOSIS — H26492 Other secondary cataract, left eye: Secondary | ICD-10-CM | POA: Diagnosis not present

## 2018-11-05 DIAGNOSIS — E119 Type 2 diabetes mellitus without complications: Secondary | ICD-10-CM | POA: Diagnosis not present

## 2018-11-05 LAB — HM DIABETES EYE EXAM

## 2018-11-18 ENCOUNTER — Encounter: Payer: Self-pay | Admitting: Family Medicine

## 2018-11-19 ENCOUNTER — Encounter: Payer: Self-pay | Admitting: Family Medicine

## 2018-11-19 ENCOUNTER — Ambulatory Visit (INDEPENDENT_AMBULATORY_CARE_PROVIDER_SITE_OTHER): Payer: Medicare Other | Admitting: Family Medicine

## 2018-11-19 ENCOUNTER — Ambulatory Visit (INDEPENDENT_AMBULATORY_CARE_PROVIDER_SITE_OTHER): Payer: Medicare Other

## 2018-11-19 VITALS — BP 124/72 | HR 98 | Temp 98.0°F | Ht 73.0 in | Wt 194.8 lb

## 2018-11-19 DIAGNOSIS — E1169 Type 2 diabetes mellitus with other specified complication: Secondary | ICD-10-CM

## 2018-11-19 DIAGNOSIS — R05 Cough: Secondary | ICD-10-CM

## 2018-11-19 DIAGNOSIS — I152 Hypertension secondary to endocrine disorders: Secondary | ICD-10-CM

## 2018-11-19 DIAGNOSIS — E785 Hyperlipidemia, unspecified: Secondary | ICD-10-CM

## 2018-11-19 DIAGNOSIS — I4892 Unspecified atrial flutter: Secondary | ICD-10-CM

## 2018-11-19 DIAGNOSIS — R5383 Other fatigue: Secondary | ICD-10-CM | POA: Diagnosis not present

## 2018-11-19 DIAGNOSIS — R0602 Shortness of breath: Secondary | ICD-10-CM | POA: Diagnosis not present

## 2018-11-19 DIAGNOSIS — E1159 Type 2 diabetes mellitus with other circulatory complications: Secondary | ICD-10-CM | POA: Diagnosis not present

## 2018-11-19 DIAGNOSIS — I10 Essential (primary) hypertension: Secondary | ICD-10-CM

## 2018-11-19 DIAGNOSIS — R059 Cough, unspecified: Secondary | ICD-10-CM

## 2018-11-19 IMAGING — DX CHEST - 2 VIEW
2 series · 2 of 2 positions shown · non-contrast
Comparison: Prior chest x-ray [DATE]

CLINICAL DATA: 89-year-old male with cough and shortness of breath
for the past 3-4 months.

EXAM:
CHEST - 2 VIEW

[chest pa]
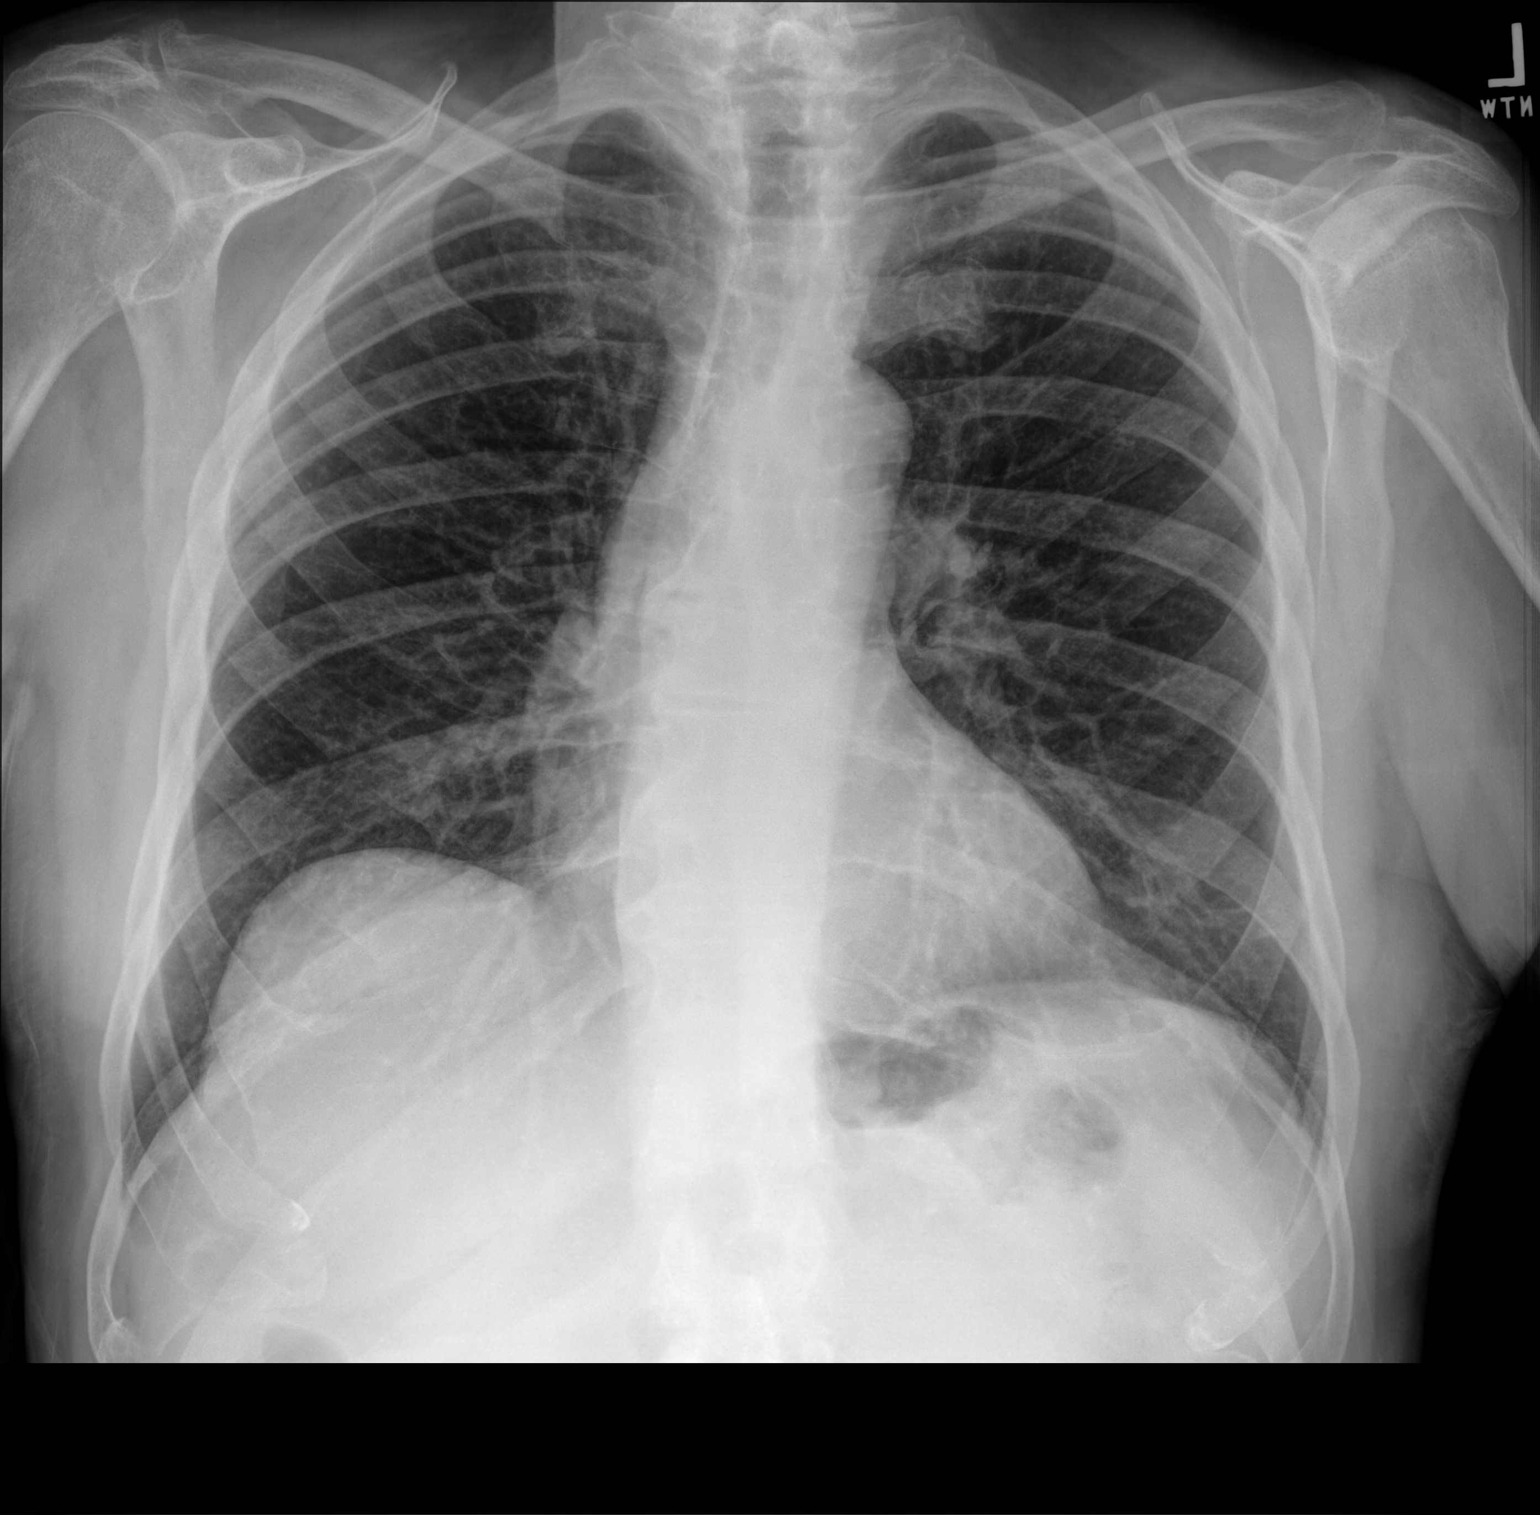

[chest lat]
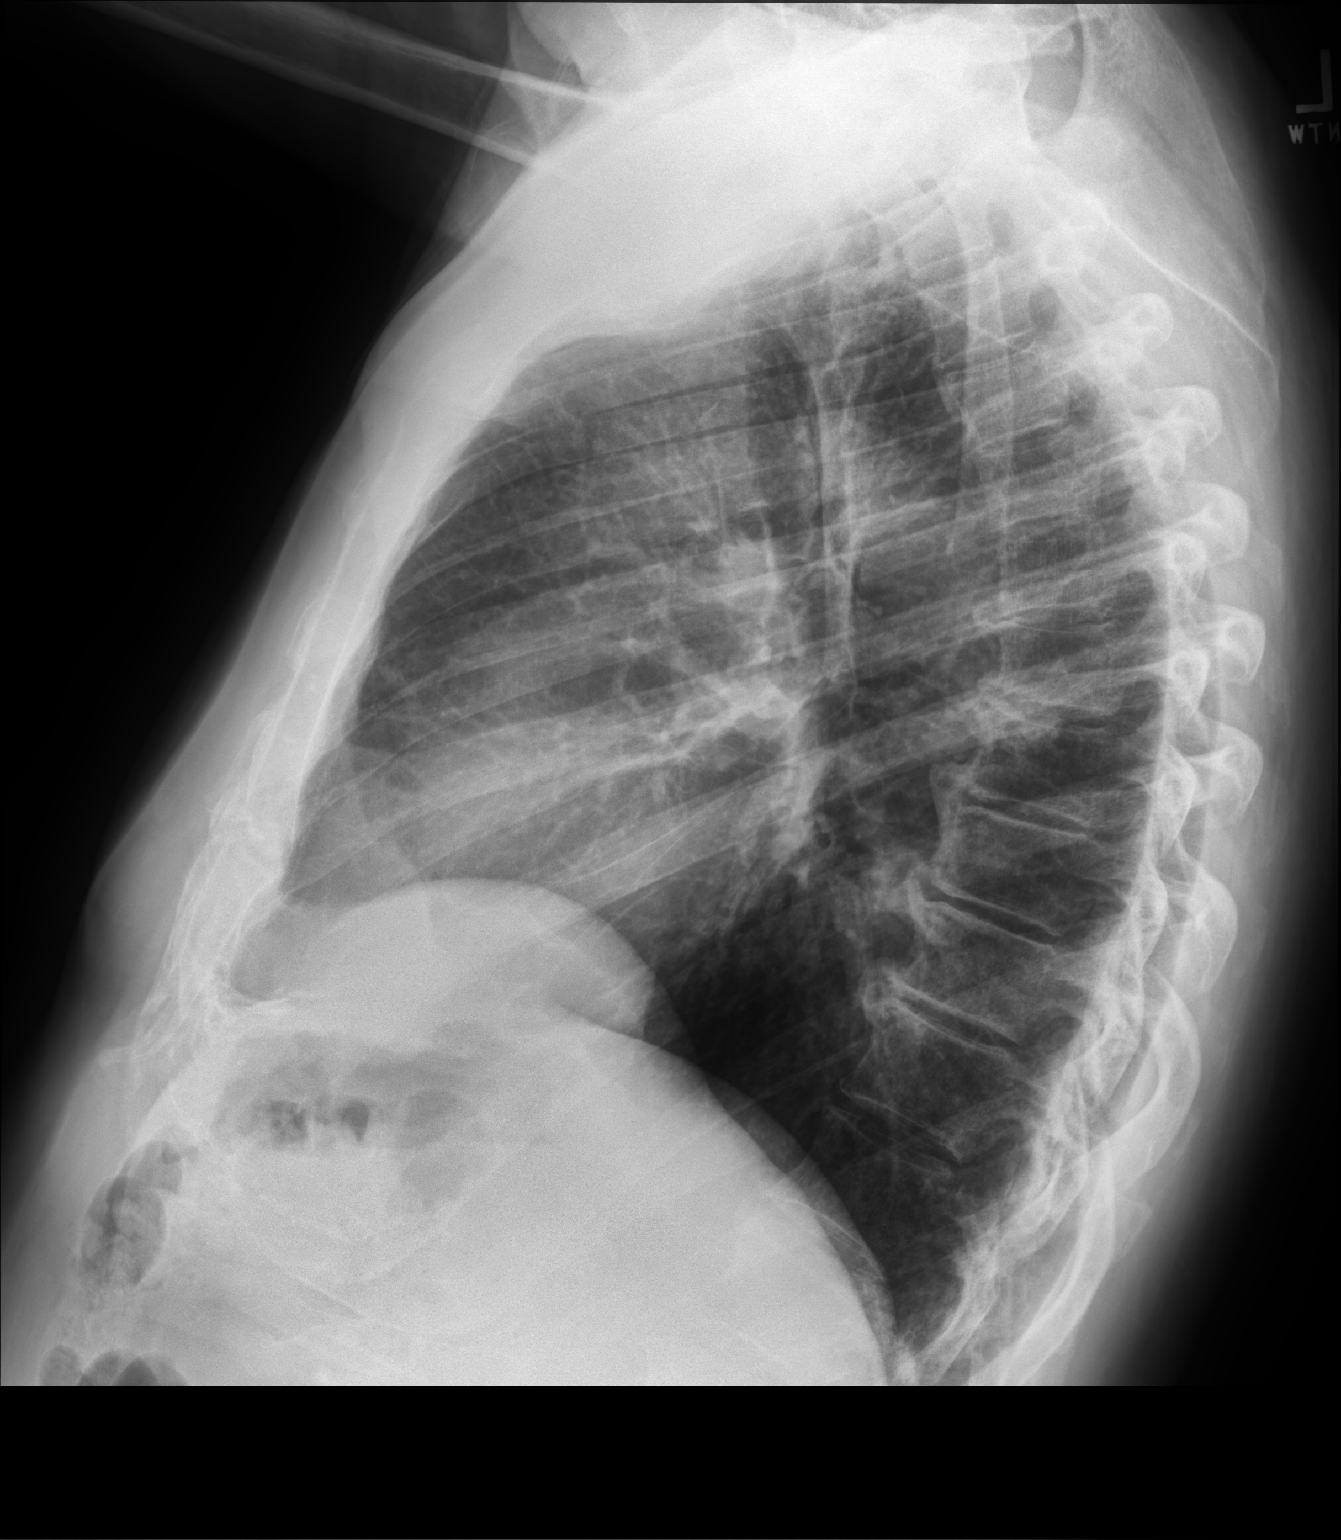

[2 of 2 positions shown; findings below may reference images not displayed]

FINDINGS: The lungs are clear and negative for focal airspace consolidation,
pulmonary edema or suspicious pulmonary nodule. No pleural effusion
or pneumothorax. Cardiac and mediastinal contours are within normal
limits. No acute fracture or lytic or blastic osseous lesions. The
visualized upper abdominal bowel gas pattern is unremarkable.
IMPRESSION: No active cardiopulmonary disease.

## 2018-11-19 MED ORDER — FLUTICASONE PROPIONATE 50 MCG/ACT NA SUSP: 2.0000 | Freq: Every day | NASAL | 3 refills | Status: DC

## 2018-11-19 NOTE — Progress Notes (Signed)
Phone [phone]   Subjective:  Sean Lane is a 85 y.o. year old very pleasant male patient who presents for/with See problem oriented charting ROS-no fever, chills, shortness of breath.  Does have fatigue.  Past Medical History-  Patient Active Problem List   Diagnosis Date Noted  . Type 2 diabetes mellitus with CAD(HCC) 12/14/2012    Priority: High  . Atrial flutter ([place]) 10/03/2011    Priority: High  . CAD (coronary artery disease) 12/17/2007    Priority: High  . SINUS BRADYCARDIA 07/20/2010    Priority: Medium  . BPH associated with nocturia 06/08/2009    Priority: Medium  . DIZZINESS 12/15/2008    Priority: Medium  . Hyperlipidemia 04/02/2007    Priority: Medium  . Essential hypertension 04/02/2007    Priority: Medium  . Nephrolithiasis 05/07/2014    Priority: Low  . Gout attack 12/14/2012    Priority: Low  . Hematuria 03/28/2012    Priority: Low  . RBBB 12/28/2008    Priority: Low  . GERD 12/15/2008    Priority: Low  . LOSS, CONDUCTIVE HEARING, COMBINED TYPE 04/16/2007    Priority: Low  . Allergic rhinitis 04/02/2007    Priority: Low  . OA (osteoarthritis) of knee 04/02/2007    Priority: Low  . Thrombocytopenia ([place]) 12/04/2016  . Abnormal nuclear stress test 10/26/2016  . Preoperative cardiovascular examination 10/26/2016  . CAD S/P percutaneous coronary angioplasty 10/26/2016  . Diverticulitis of colon 11/22/2015    Medications- reviewed and updated Current Outpatient Medications  Medication Sig Dispense Refill  . acetaminophen (TYLENOL) 650 MG CR tablet Take 1,300 mg by mouth every 8 (eight) hours as needed for pain.    Marland Kitchen aspirin EC 81 MG tablet Take 81 mg by mouth daily.    Marland Kitchen CREAM BASE EX Apply 1 application topically at bedtime. Diabetics' Dry Skin Relief (applied to feet)    . dutasteride (AVODART) 0.5 MG capsule Take 1 capsule (0.5 mg total) by mouth daily. 90 capsule 3  . FREESTYLE INSULINX TEST test strip E11.9 USE TO CHECK BLOOD  SUGAR DAILY AND AS NEEDED 100 each 4  . Lancets (FREESTYLE) lancets E11.9 -CHECK BLOOD SUGAR ONCE DAILY 100 each 4  . losartan (COZAAR) 25 MG tablet TAKE 1 TABLET BY MOUTH EVERY DAY 90 tablet 2  . Naphazoline-Pheniramine (OPCON-A OP) Apply 1 drop to eye daily as needed (allergies).    . nitroGLYCERIN (NITROSTAT) 0.4 MG SL tablet Place 1 tablet (0.4 mg total) under the tongue every 5 (five) minutes as needed for chest pain (for chest pain). 25 tablet 3  . NON FORMULARY [facility]  Peripheral Neuropathy Cream- Bupivacaine 1%, Doxepin 3%, Gabapentin 6%, Pentoxifylline 3%, Topiramate 1% Apply 1-2 grams to affected area 3-4 times daily Qty. 120 gm 3 refills    . NONFORMULARY OR COMPOUNDED ITEM Apply 1 application topically 3 (three) times daily as needed (for itchy/irritated skin). CLOBETASOL 0.05% MIX WITH CETAPHIL    . Omega-3 Fatty Acids (FISH OIL) 1200 MG CAPS Take 2 capsules by mouth 2 (two) times daily.    Marland Kitchen oxymetazoline (AFRIN) 0.05 % nasal spray Place 1 spray into both nostrils 2 (two) times daily as needed for congestion.    . predniSONE (DELTASONE) 20 MG tablet Take 1 tablet po q day with breakfast as directed 30 tablet 0  . rosuvastatin (CRESTOR) 10 MG tablet TAKE 1 TABLET DAILY 90 tablet 3  . tamsulosin (FLOMAX) 0.4 MG CAPS capsule TAKE 1 CAPSULE DAILY 90 capsule 1  .  XARELTO 20 MG TABS tablet TAKE 1 TABLET DAILY WITH   SUPPER 90 tablet 2  . fluticasone (FLONASE) 50 MCG/ACT nasal spray Place 2 sprays into both nostrils daily. 16 g 3   No current facility-administered medications for this visit.      Objective:  BP 124/72   Pulse 98   Temp 98 F (36.7 C) (Oral)   Ht 6\' 1"  (1.854 m)   Wt 194 lb 13 oz (88.4 kg)   SpO2 (!) 61%   BMI 25.70 kg/m  Gen: NAD, resting comfortably Nasal turbinate edema and clear discharge noted, pharynx with mild cobblestoning CV: RRR no murmurs rubs or gallops Lungs: CTAB no crackles, wheeze, rhonchi Abdomen:  soft/nontender/nondistended/normal bowel sounds. No rebound or guarding.  Ext: no edema Skin: warm, dry Neuro: Normal gait and speech-able to get on the table without assist.    Assessment and Plan   Cough - Plan: DG Chest 2 View Fatigue, unspecified type - Plan: CBC, Comprehensive metabolic panel, TSH S: Patient started with fatigue, cough, nasal congestion 6-7 weeks ago. Feels run down. Feels low energy to get out of chair at times.  Cough can be deep and hurt in his chest.  Not sleeping well either- wakes up in night coughing or going to the bathroom.  Tylenol- takes early in Am and that seems to help him some.  No fevers. No shortness of breath.  Does feel some sinus pressure.  Using afrin spray nightly for weeks- advised him to stop.  Taking claritin x 3 doses. Seems to help some.  Former smoker but remote history- quit 1963.  Has been able to exercise still- 45 mins cardio bu tnot always able to do 3x a week he had been doing Sometimes will need prolonged rest after not significant activity.  No recent travel out of state or known covid-19 process.  Has had watery eyes and runny nose.  A/P: strongly suspect allergies as cause of symptoms-particularly given improvement on over-the-counter antihistamine.  We will add Flonase to this regimen.  The level of fatigue is abnormal for patient though.  We opted to go ahead and update his labs ahead of his late March visit - also will check chest x-ray to make sure no walking pneumonia. Lungs clear on exam  #Diabetes- with known CAD/thus circulatory complications S: Has been diet controlled A/P: Likely stable-update A1c today  #Hyperlipidemia associated with diabetes S: Well controlled on rosuvastatin 10 mg A/P: Too early for repeat full lipid panel- suspect well-controlled-continue current medication   #Hypertension associated with diabetes S: Well controlled on losartan 25 mg alone A/P:  Stable. Continue current medications.      #Atrial flutter S: Remote history-patient has been continued on Xarelto for anticoagulation.  Has not required rate control as has not been in atrial fibrillation recently. A/P:  Stable. Continue current medications.      Future Appointments  Date Time Provider [place]  12/10/2018 10:40 AM [name], MD [facility]  01/28/2019  2:15 PM [name], DPM [facility]  02/11/2019  2:00 PM [name], MD [facility]  10/15/2019  3:00 PM [facility] HEALTH COACH [facility]   Lab/Order associations: Fatigue, unspecified type - Plan: CBC, Comprehensive metabolic panel, TSH  Type 2 diabetes mellitus with other circulatory complication, without long-term current use of insulin (HCC) - Plan: CBC, Comprehensive metabolic panel, Hemoglobin A1c  Hyperlipidemia associated with type 2 diabetes mellitus ([place])  Hypertension associated with diabetes ([place])  Cough - Plan:  DG Chest 2 View  Meds ordered this encounter  Medications  . DISCONTD: fluticasone (FLONASE) 50 MCG/ACT nasal spray    Sig: Place 2 sprays into both nostrils daily.    Dispense:  16 g    Refill:  3  . fluticasone (FLONASE) 50 MCG/ACT nasal spray    Sig: Place 2 sprays into both nostrils daily.    Dispense:  16 g    Refill:  3   Return precautions advised.  [name], MD

## 2018-11-19 NOTE — Patient Instructions (Addendum)
Please stop by lab and x-ray before you go If you do not have mychart- we will call you about results within 5 business days of [place] receiving them.  If you have mychart- we will send your results within 3 business days of [place] receiving them.  If abnormal or we want to clarify a result, we will call or mychart you to make sure you receive the message.  If you have questions or concerns or don't hear within 5-7 days, please send [place] a message or call us.    I think this is most likely allergies given your exam today but lets be safe by doing some bloodwork and x-ray- if you have worsening or new symptoms see me back. We will plan on checkin in 3 weeks from now.

## 2018-11-20 LAB — CBC
HEMATOCRIT: 40 % (ref 39.0–52.0)
HEMOGLOBIN: 13.9 g/dL (ref 13.0–17.0)
MCHC: 34.9 g/dL (ref 30.0–36.0)
MCV: 89.7 fl (ref 78.0–100.0)
PLATELETS: 168 10*3/uL (ref 150.0–400.0)
RBC: 4.46 Mil/uL (ref 4.22–5.81)
RDW: 14.2 % (ref 11.5–15.5)
WBC: 4.3 10*3/uL (ref 4.0–10.5)

## 2018-11-20 LAB — COMPREHENSIVE METABOLIC PANEL
ALBUMIN: 4.4 g/dL (ref 3.5–5.2)
ALT: 10 U/L (ref 0–53)
AST: 15 U/L (ref 0–37)
Alkaline Phosphatase: 92 U/L (ref 39–117)
BUN: 20 mg/dL (ref 6–23)
CALCIUM: 9.7 mg/dL (ref 8.4–10.5)
CHLORIDE: 105 meq/L (ref 96–112)
CO2: 29 mEq/L (ref 19–32)
CREATININE: 0.87 mg/dL (ref 0.40–1.50)
GFR: 82.74 mL/min (ref >60.00)
Glucose, Bld: 99 mg/dL (ref 70–99)
POTASSIUM: 4.2 meq/L (ref 3.5–5.1)
Sodium: 142 mEq/L (ref 135–145)
Total Bilirubin: 0.5 mg/dL (ref 0.2–1.2)
Total Protein: 6.3 g/dL (ref 6.0–8.3)

## 2018-11-20 LAB — TSH: TSH: 1.39 u[IU]/mL (ref 0.35–4.50)

## 2018-11-20 LAB — HEMOGLOBIN A1C: Hgb A1c MFr Bld: 6.1 % (ref 4.6–6.5)

## 2018-11-23 ENCOUNTER — Other Ambulatory Visit: Payer: Self-pay | Admitting: Family Medicine

## 2018-12-10 ENCOUNTER — Ambulatory Visit: Payer: Medicare Other | Admitting: Family Medicine

## 2019-01-11 ENCOUNTER — Other Ambulatory Visit: Payer: Self-pay | Admitting: Family Medicine

## 2019-01-28 ENCOUNTER — Ambulatory Visit: Payer: Medicare Other | Admitting: Podiatry

## 2019-01-29 ENCOUNTER — Telehealth: Payer: Self-pay | Admitting: Cardiovascular Disease

## 2019-01-29 NOTE — Telephone Encounter (Signed)
New message     Called to convert 02-11-19 ov to virtual video visit.  Wife said she had power of attorney and that will be great to do a video visit.  They were afraid of coming to the office.  Wife gave consent for patient.  YOUR CARDIOLOGY TEAM HAS ARRANGED FOR AN E-VISIT FOR YOUR APPOINTMENT - PLEASE REVIEW IMPORTANT INFORMATION BELOW SEVERAL DAYS PRIOR TO YOUR APPOINTMENT  Due to the recent COVID-19 pandemic, we are transitioning in-person office visits to tele-medicine visits in an effort to decrease unnecessary exposure to our patients, their families, and staff. These visits are billed to your insurance just like a normal visit is. We also encourage you to sign up for MyChart if you have not already done so. You will need a smartphone if possible. For patients that do not have this, we can still complete the visit using a regular telephone but do prefer a smartphone to enable video when possible. You may have a family member that lives with you that can help. If possible, we also ask that you have a blood pressure cuff and scale at home to measure your blood pressure, heart rate and weight prior to your scheduled appointment. Patients with clinical needs that need an in-person evaluation and testing will still be able to come to the office if absolutely necessary. If you have any questions, feel free to call our office.     YOUR PROVIDER WILL BE USING THE FOLLOWING PLATFORM TO COMPLETE YOUR VISIT: Doxy Me   IF USING MYCHART - How to Download the MyChart App to Your SmartPhone   - If Apple, go to [employer] and type in MyChart in the search bar and download the app. If Android, ask patient to go to [employer] and type in [place] in the search bar and download the app. The app is free but as with any other app downloads, your phone may require you to verify saved payment information or Apple/Android password.  - You will need to then log into the app with your MyChart username and  password, and select [facility] as your healthcare provider to link the account.  - When it is time for your visit, go to the MyChart app, find appointments, and click Begin Video Visit. Be sure to Select Allow for your device to access the Microphone and Camera for your visit. You will then be connected, and your provider will be with you shortly.  **If you have any issues connecting or need assistance, please contact MyChart service desk [phone])**  **If using a computer, in order to ensure the best quality for your visit, you will need to use either of the following Internet Browsers: Insurance underwriter or Microsoft Edge**   IF USING DOXIMITY or [email or web address] - The staff will give you instructions on receiving your link to join the meeting the day of your visit.      2-3 DAYS BEFORE YOUR APPOINTMENT  You will receive a telephone call from one of our [place] team members - your caller ID may say "Unknown caller." If this is a video visit, we will walk you through how to get the video launched on your phone. We will remind you check your blood pressure, heart rate and weight prior to your scheduled appointment. If you have an Apple Watch or Kardia, please upload any pertinent ECG strips the day before or morning of your appointment to [place]. Our staff will also make sure you have reviewed  the consent and agree to move forward with your scheduled tele-health visit.     THE DAY OF YOUR APPOINTMENT  Approximately 15 minutes prior to your scheduled appointment, you will receive a telephone call from one of [facility] team - your caller ID may say "Unknown caller."  Our staff will confirm medications, vital signs for the day and any symptoms you may be experiencing. Please have this information available prior to the time of visit start. It may also be helpful for you to have a pad of paper and pen handy for any instructions given during your visit. They will also walk you through joining  the smartphone meeting if this is a video visit.    CONSENT FOR TELE-HEALTH VISIT - PLEASE REVIEW  I hereby voluntarily request, consent and authorize [facility] and its employed or contracted physicians, physician assistants, nurse practitioners or other licensed health care professionals (the Practitioner), to provide me with telemedicine health care services (the Services") as deemed necessary by the treating Practitioner. I acknowledge and consent to receive the Services by the Practitioner via telemedicine. I understand that the telemedicine visit will involve communicating with the Practitioner through live audiovisual communication technology and the disclosure of certain medical information by electronic transmission. I acknowledge that I have been given the opportunity to request an in-person assessment or other available alternative prior to the telemedicine visit and am voluntarily participating in the telemedicine visit.  I understand that I have the right to withhold or withdraw my consent to the use of telemedicine in the course of my care at any time, without affecting my right to future care or treatment, and that the Practitioner or I may terminate the telemedicine visit at any time. I understand that I have the right to inspect all information obtained and/or recorded in the course of the telemedicine visit and may receive copies of available information for a reasonable fee.  I understand that some of the potential risks of receiving the Services via telemedicine include:   Delay or interruption in medical evaluation due to technological equipment failure or disruption;  Information transmitted may not be sufficient (e.g. poor resolution of images) to allow for appropriate medical decision making by the Practitioner; and/or   In rare instances, security protocols could fail, causing a breach of personal health information.  Furthermore, I acknowledge that it is my  responsibility to provide information about my medical history, conditions and care that is complete and accurate to the best of my ability. I acknowledge that Practitioner's advice, recommendations, and/or decision may be based on factors not within their control, such as incomplete or inaccurate data provided by me or distortions of diagnostic images or specimens that may result from electronic transmissions. I understand that the practice of medicine is not an exact science and that Practitioner makes no warranties or guarantees regarding treatment outcomes. I acknowledge that I will receive a copy of this consent concurrently upon execution via email to the email address I last provided but may also request a printed copy by calling the office of [facility].    I understand that my insurance will be billed for this visit.   I have read or had this consent read to me.  I understand the contents of this consent, which adequately explains the benefits and risks of the Services being provided via telemedicine.   I have been provided ample opportunity to ask questions regarding this consent and the Services and have had my questions answered to  my satisfaction.  I give my informed consent for the services to be provided through the use of telemedicine in my medical care  By participating in this telemedicine visit I agree to the above.
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
## 2019-02-11 ENCOUNTER — Telehealth (INDEPENDENT_AMBULATORY_CARE_PROVIDER_SITE_OTHER): Payer: Medicare Other | Admitting: Cardiovascular Disease

## 2019-02-11 ENCOUNTER — Other Ambulatory Visit: Payer: Self-pay

## 2019-02-11 ENCOUNTER — Encounter: Payer: Self-pay | Admitting: Cardiovascular Disease

## 2019-02-11 VITALS — BP 127/57 | HR 58 | Ht 72.0 in | Wt 187.0 lb

## 2019-02-11 DIAGNOSIS — I2510 Atherosclerotic heart disease of native coronary artery without angina pectoris: Secondary | ICD-10-CM

## 2019-02-11 DIAGNOSIS — I480 Paroxysmal atrial fibrillation: Secondary | ICD-10-CM

## 2019-02-11 MED ORDER — NITROGLYCERIN 0.4 MG SL SUBL: 0.4000 mg | SUBLINGUAL_TABLET | SUBLINGUAL | 3 refills | Status: DC | PRN

## 2019-02-11 NOTE — Patient Instructions (Addendum)

## 2019-02-12 ENCOUNTER — Other Ambulatory Visit: Payer: Self-pay | Admitting: *Deleted

## 2019-02-12 ENCOUNTER — Telehealth: Payer: Self-pay | Admitting: Cardiovascular Disease

## 2019-02-12 MED ORDER — NITROGLYCERIN 0.4 MG SL SUBL: 0.4000 mg | SUBLINGUAL_TABLET | SUBLINGUAL | 3 refills | Status: DC | PRN

## 2019-02-12 NOTE — Telephone Encounter (Addendum)
If in the directions for the nitroGLYCERIN (NITROSTAT) 0.4 MG SL tablet if it can be added:  For up to three doses if no relief call 911.

## 2019-02-12 NOTE — Telephone Encounter (Signed)
I have added (For up to three doses if no relief call 911) onto NTG Rx. I will resend to pharmacy

## 2019-02-13 ENCOUNTER — Other Ambulatory Visit: Payer: Self-pay | Admitting: Cardiovascular Disease

## 2019-02-13 MED ORDER — NITROGLYCERIN 0.4 MG SL SUBL: 0.4000 mg | SUBLINGUAL_TABLET | SUBLINGUAL | 1 refills | Status: DC | PRN

## 2019-02-13 NOTE — Telephone Encounter (Signed)
Reference# [id number]    New Message:   She wants to add to directions: Use up to 3 dosages, if no relief call 911.

## 2019-02-13 NOTE — Telephone Encounter (Signed)
Pt's medication was resent to pt's pharmacy with complete directions as requested. Confirmation received.

## 2019-03-17 ENCOUNTER — Encounter: Payer: Self-pay | Admitting: Family Medicine

## 2019-03-25 ENCOUNTER — Other Ambulatory Visit: Payer: Self-pay

## 2019-03-25 ENCOUNTER — Encounter: Payer: Self-pay | Admitting: Podiatry

## 2019-03-25 ENCOUNTER — Ambulatory Visit (INDEPENDENT_AMBULATORY_CARE_PROVIDER_SITE_OTHER): Payer: Medicare Other | Admitting: Podiatry

## 2019-03-25 VITALS — Temp 97.3°F

## 2019-03-25 DIAGNOSIS — B351 Tinea unguium: Secondary | ICD-10-CM | POA: Diagnosis not present

## 2019-03-25 DIAGNOSIS — E1151 Type 2 diabetes mellitus with diabetic peripheral angiopathy without gangrene: Secondary | ICD-10-CM | POA: Diagnosis not present

## 2019-03-25 DIAGNOSIS — M79674 Pain in right toe(s): Secondary | ICD-10-CM

## 2019-03-25 DIAGNOSIS — D689 Coagulation defect, unspecified: Secondary | ICD-10-CM | POA: Diagnosis not present

## 2019-03-25 DIAGNOSIS — M79675 Pain in left toe(s): Secondary | ICD-10-CM | POA: Diagnosis not present

## 2019-03-25 NOTE — Progress Notes (Signed)
Complaint:  Visit Type: Patient returns to my office for continued preventative foot care services. Complaint: Patient states" my nails have grown long and thick and become painful to walk and wear shoes" Patient has been diagnosed with DM with no foot complications. The patient presents for preventative foot care services. No changes to ROS.  Patient is taking xarelto.  Podiatric Exam: Vascular: dorsalis pedis  pulses are palpable bilateral.Posterior tibial pulses are absent. Capillary return is immediate. Temperature gradient is WNL. Skin turgor WNL  Sensorium: Diminished  Semmes Weinstein monofilament test. Normal tactile sensation bilaterally. Nail Exam: Pt has thick disfigured discolored nails with subungual debris noted bilateral entire nail hallux through fifth toenails Ulcer Exam: There is no evidence of ulcer or pre-ulcerative changes or infection. Orthopedic Exam: Muscle tone and strength are WNL. No limitations in general ROM. No crepitus or effusions noted. Foot type and digits show no abnormalities. Bony prominences are unremarkable. Skin: No Porokeratosis. No infection or ulcers  Diagnosis:  Onychomycosis, , Pain in right toe, pain in left toes  Treatment & Plan Procedures and Treatment: Consent by patient was obtained for treatment procedures.   Debridement of mycotic and hypertrophic toenails, 1 through 5 bilateral and clearing of subungual debris. No ulceration, no infection noted.  Return Visit-Office Procedure: Patient instructed to return to the office for a follow up visit 3 months for continued evaluation and treatment.    [name] DPM

## 2019-04-04 ENCOUNTER — Other Ambulatory Visit: Payer: Self-pay | Admitting: Cardiovascular Disease

## 2019-04-15 ENCOUNTER — Encounter: Payer: Self-pay | Admitting: Family Medicine

## 2019-04-15 ENCOUNTER — Other Ambulatory Visit: Payer: Self-pay

## 2019-04-15 DIAGNOSIS — M549 Dorsalgia, unspecified: Secondary | ICD-10-CM

## 2019-04-15 DIAGNOSIS — M542 Cervicalgia: Secondary | ICD-10-CM

## 2019-04-15 DIAGNOSIS — G8929 Other chronic pain: Secondary | ICD-10-CM

## 2019-04-15 DIAGNOSIS — M546 Pain in thoracic spine: Secondary | ICD-10-CM

## 2019-05-01 ENCOUNTER — Other Ambulatory Visit: Payer: Self-pay | Admitting: Family Medicine

## 2019-05-26 DIAGNOSIS — Z23 Encounter for immunization: Secondary | ICD-10-CM | POA: Diagnosis not present
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
## 2019-05-28 ENCOUNTER — Other Ambulatory Visit: Payer: Self-pay

## 2019-05-28 ENCOUNTER — Encounter: Payer: Self-pay | Admitting: Family Medicine

## 2019-05-28 ENCOUNTER — Ambulatory Visit (INDEPENDENT_AMBULATORY_CARE_PROVIDER_SITE_OTHER)
Admission: RE | Admit: 2019-05-28 | Discharge: 2019-05-28 | Disposition: A | Discharge status: Home / Self Care | Payer: Medicare Other | Source: Ambulatory Visit | Attending: Family Medicine | Admitting: Family Medicine

## 2019-05-28 ENCOUNTER — Ambulatory Visit (INDEPENDENT_AMBULATORY_CARE_PROVIDER_SITE_OTHER): Payer: Medicare Other | Admitting: Family Medicine

## 2019-05-28 VITALS — BP 128/70 | HR 75 | Ht 72.0 in

## 2019-05-28 DIAGNOSIS — M542 Cervicalgia: Secondary | ICD-10-CM

## 2019-05-28 DIAGNOSIS — I2510 Atherosclerotic heart disease of native coronary artery without angina pectoris: Secondary | ICD-10-CM

## 2019-05-28 DIAGNOSIS — M47814 Spondylosis without myelopathy or radiculopathy, thoracic region: Secondary | ICD-10-CM | POA: Diagnosis not present

## 2019-05-28 DIAGNOSIS — M5030 Other cervical disc degeneration, unspecified cervical region: Secondary | ICD-10-CM

## 2019-05-28 DIAGNOSIS — M999 Biomechanical lesion, unspecified: Secondary | ICD-10-CM | POA: Diagnosis not present

## 2019-05-28 IMAGING — DX DG THORACIC SPINE 3V
3 series · 3 of 3 positions shown · non-contrast
Comparison: [DATE]

CLINICAL DATA: Chronic back pain

EXAM:
THORACIC SPINE - 3 VIEWS

[t-spine ap]
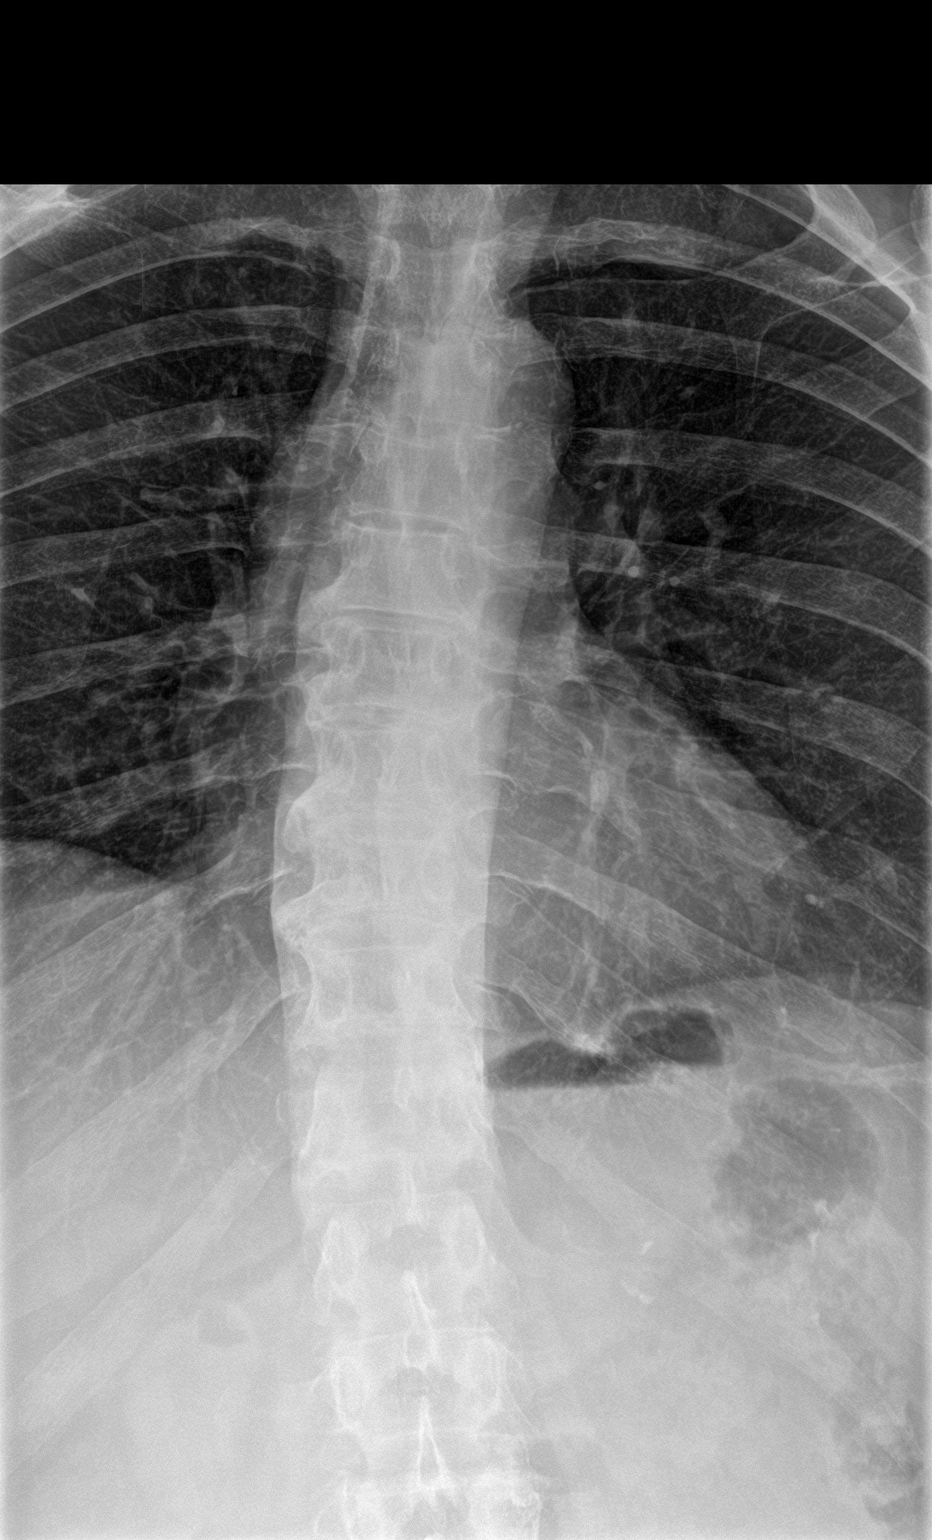

[t-spine lat]
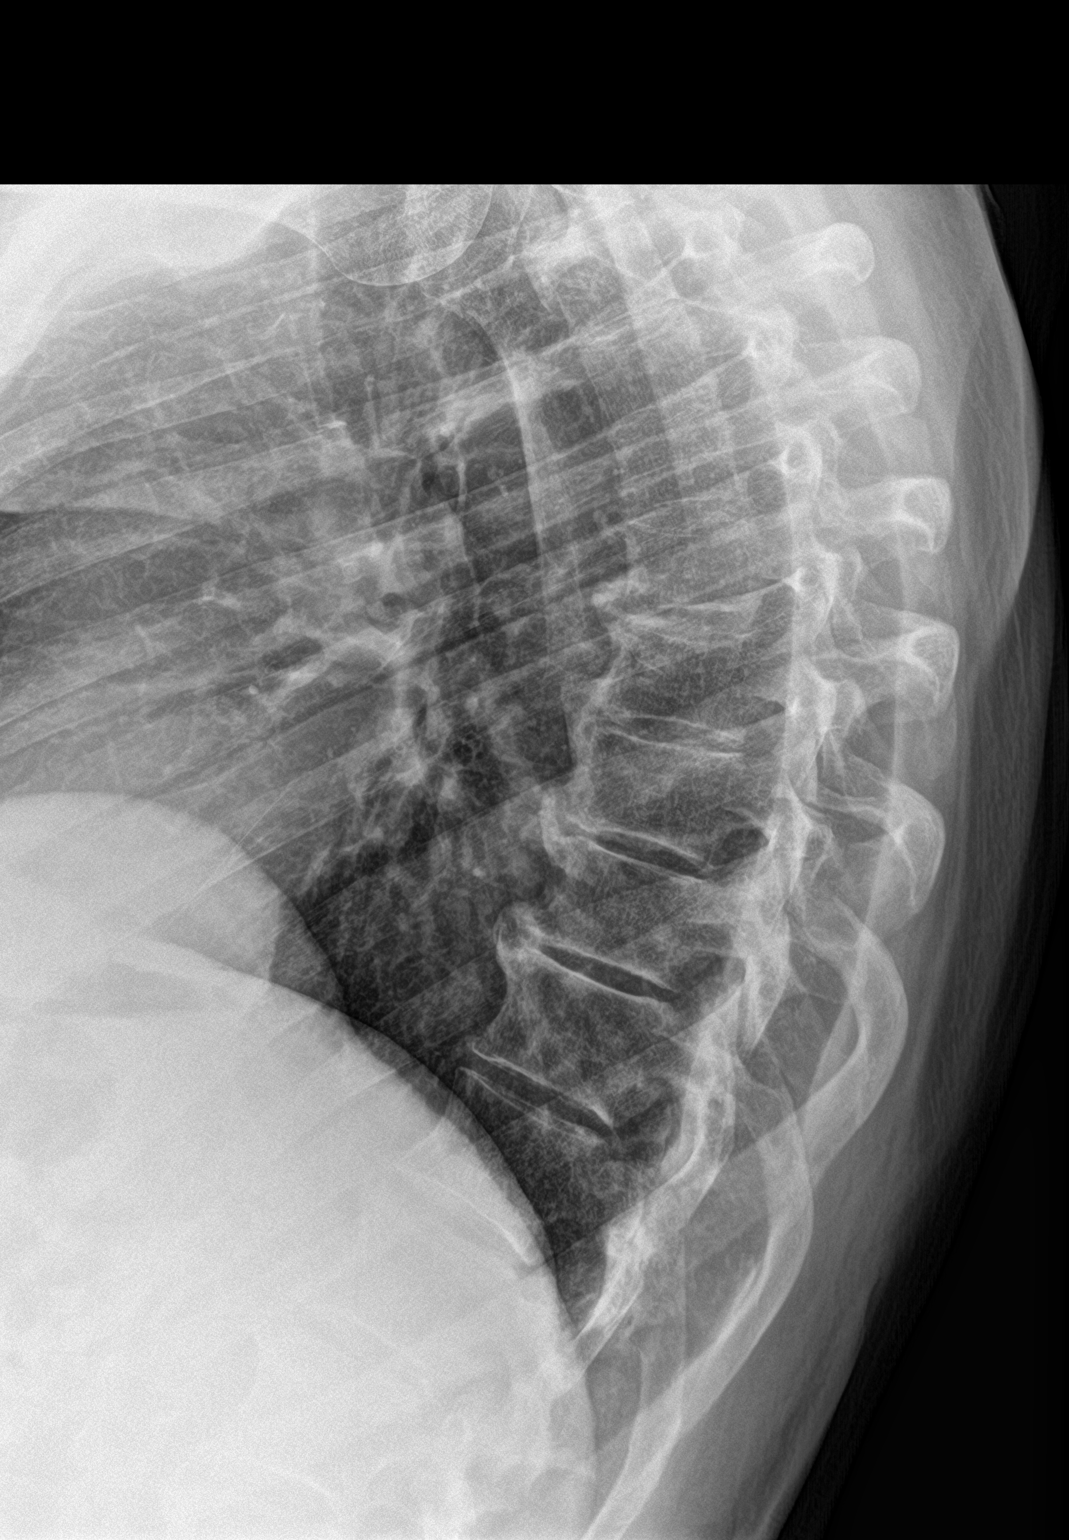

[swimmer]
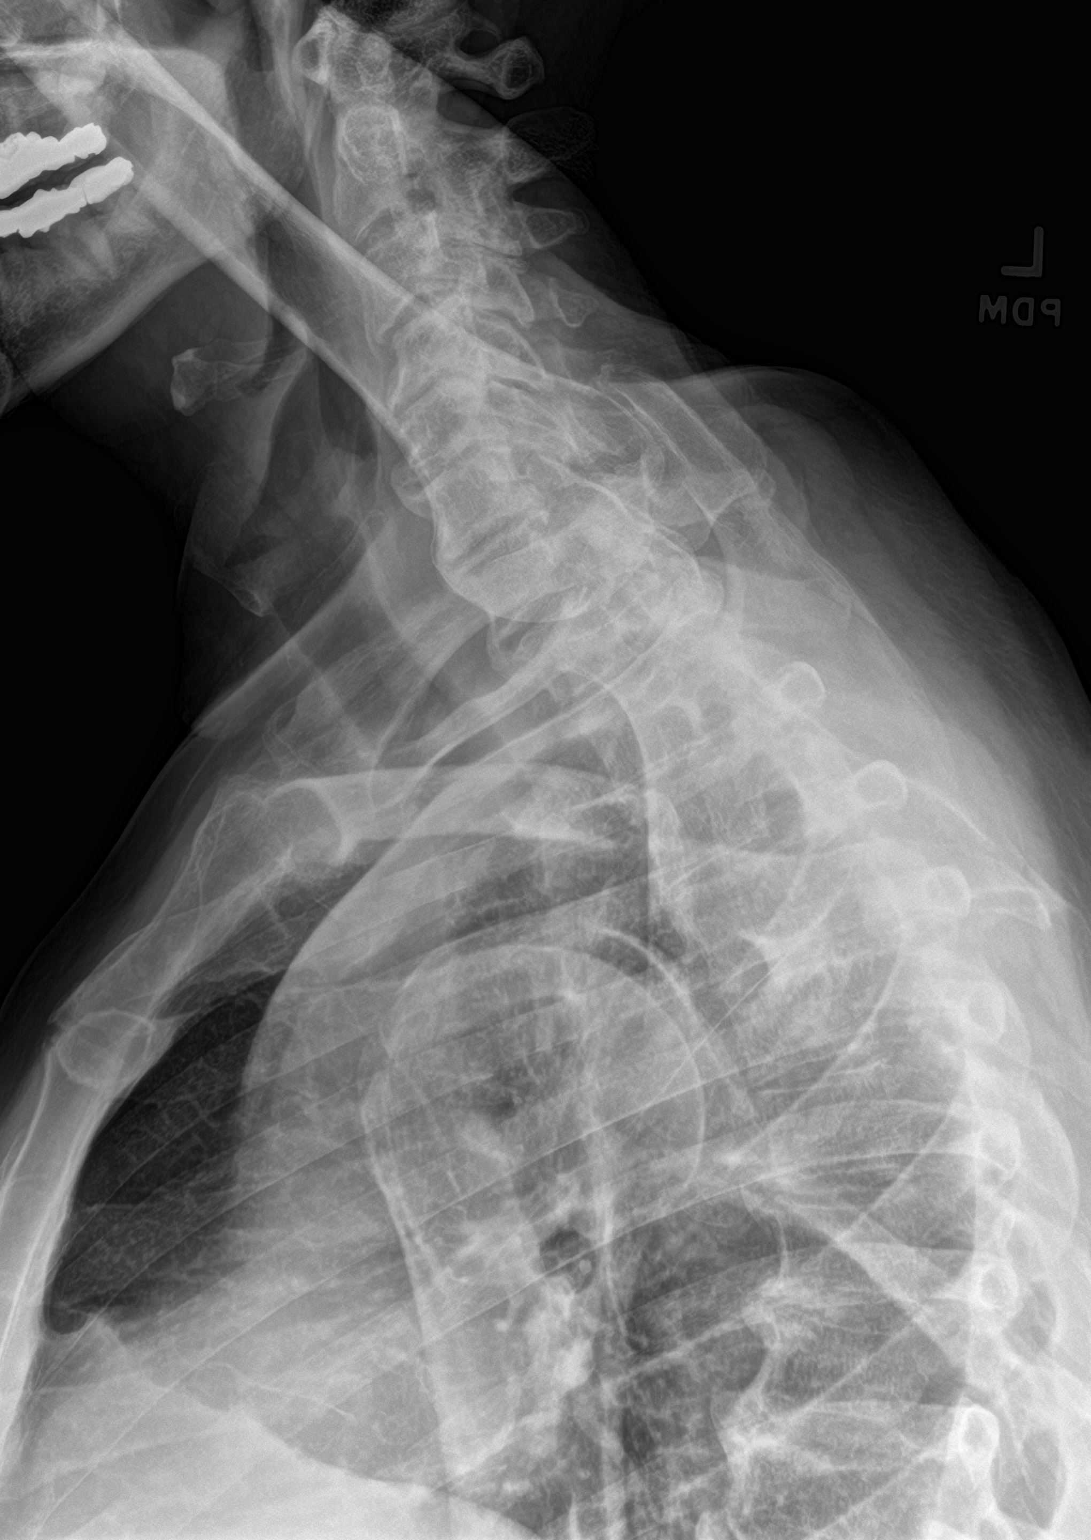

[3 of 3 positions shown; findings below may reference images not displayed]

FINDINGS: No evidence of acute fracture. Vertebral body heights are
maintained. Mild dextrocurvature of the thoracic spine. Bridging
anterior endplate osteophytes within the midthoracic spine.
Partially visualized multilevel cervical spondylosis. Visualized
lung fields are clear.
IMPRESSION: Similar mild degenerative changes of thoracic spine compared to
[DATE].

## 2019-05-28 NOTE — Assessment & Plan Note (Signed)
Moderate to severe degenerative disc disease.  Patient has had previous x-rays and many.  I would like to repeat these because they are over a year old and patient has had some increasing discomfort.  Concern for potential stress reaction or stress fracture.  Attempted some muscle energy techniques with osteopathic manipulation but discontinued with any high velocity secondary to the amount of tightness and pain in the individual was having.  Patient is not having any radicular symptoms.  Concerned with patient's other comorbidities about doing too many medications.  Topical anti-inflammatories given.  Discussed Tylenol.  Patient will follow-up with me again in 4 weeks.  Worsening symptoms I would consider the possibility of advanced imaging of the neck and thoracic that would likely be more CT scan secondary to patient's also comorbidities.

## 2019-05-28 NOTE — Assessment & Plan Note (Signed)
Decision today to treat with OMT was based on Physical Exam  After verbal consent patient was treated with  MEtechniques in cervical, thoracic areas  Patient tolerated the procedure well with improvement in symptoms  Patient given exercises, stretches and lifestyle modifications  See medications in patient instructions if given  Patient will follow up in 4 weeks

## 2019-05-28 NOTE — Patient Instructions (Signed)
Good to see you.  Ice 20 minutes 2 times daily. Usually after activity and before bed. Exercises 3 times a week.  Tart cherry extract 1200mg  at night Vitamin D 2000 IU daily  See me again in 4-6 weeks

## 2019-06-23 ENCOUNTER — Other Ambulatory Visit: Payer: Self-pay | Admitting: Family Medicine
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
## 2019-06-27 ENCOUNTER — Encounter: Payer: Self-pay | Admitting: Family Medicine

## 2019-06-27 ENCOUNTER — Ambulatory Visit (INDEPENDENT_AMBULATORY_CARE_PROVIDER_SITE_OTHER): Payer: Medicare Other | Admitting: Family Medicine

## 2019-06-27 ENCOUNTER — Other Ambulatory Visit: Payer: Self-pay

## 2019-06-27 DIAGNOSIS — M5030 Other cervical disc degeneration, unspecified cervical region: Secondary | ICD-10-CM | POA: Diagnosis not present

## 2019-06-27 DIAGNOSIS — M62838 Other muscle spasm: Secondary | ICD-10-CM | POA: Insufficient documentation

## 2019-06-27 DIAGNOSIS — I2510 Atherosclerotic heart disease of native coronary artery without angina pectoris: Secondary | ICD-10-CM

## 2019-06-27 DIAGNOSIS — M999 Biomechanical lesion, unspecified: Secondary | ICD-10-CM | POA: Diagnosis not present

## 2019-06-27 NOTE — Assessment & Plan Note (Signed)
Decision today to treat with OMT was based on Physical Exam  After verbal consent patient was treated with HVLA, ME, FPR techniques in  Thoracic areas  Patient tolerated the procedure well with improvement in symptoms  Patient given exercises, stretches and lifestyle modifications  See medications in patient instructions if given  Patient will follow up in 4-8 weeks

## 2019-06-27 NOTE — Assessment & Plan Note (Signed)
Patient does have degenerative disc disease.  Has responded very well to osteopathic manipulation 1 time.  Discussed which activities to do which wants to avoid.  Patient will increase activity slowly.  Follow-up with me again in 8 weeks.

## 2019-06-27 NOTE — Assessment & Plan Note (Signed)
Left piriformis muscle spasm.  Patient is on differential includes a lumbar radiculopathy.  Due to patient's age and comorbidities likely will not be a candidate for any type of surgery or epidurals.  Patient will try the home exercises, which activities to do, which activities can cause exacerbations.  Discussed medial massage likely helpful.  Follow-up again 4 to 8 weeks

## 2019-06-27 NOTE — Patient Instructions (Signed)
Tennis ball in back left pocket.  Take wallet out of back pocket.  Exercises 3x/week.  See me back in 2 months.

## 2019-06-28 ENCOUNTER — Encounter: Payer: Self-pay | Admitting: Family Medicine

## 2019-07-01 ENCOUNTER — Encounter: Payer: Self-pay | Admitting: Podiatry

## 2019-07-01 ENCOUNTER — Other Ambulatory Visit: Payer: Self-pay

## 2019-07-01 ENCOUNTER — Ambulatory Visit (INDEPENDENT_AMBULATORY_CARE_PROVIDER_SITE_OTHER): Payer: Medicare Other | Admitting: Podiatry

## 2019-07-01 DIAGNOSIS — E1151 Type 2 diabetes mellitus with diabetic peripheral angiopathy without gangrene: Secondary | ICD-10-CM

## 2019-07-01 DIAGNOSIS — D689 Coagulation defect, unspecified: Secondary | ICD-10-CM

## 2019-07-01 DIAGNOSIS — M79675 Pain in left toe(s): Secondary | ICD-10-CM | POA: Diagnosis not present

## 2019-07-01 DIAGNOSIS — B351 Tinea unguium: Secondary | ICD-10-CM

## 2019-07-01 DIAGNOSIS — M79674 Pain in right toe(s): Secondary | ICD-10-CM

## 2019-07-01 NOTE — Progress Notes (Signed)
Complaint:  Visit Type: Patient returns to my office for continued preventative foot care services. Complaint: Patient states" my nails have grown long and thick and become painful to walk and wear shoes" Patient has been diagnosed with DM with no foot complications. The patient presents for preventative foot care services. No changes to ROS.  Patient is taking xarelto.  Podiatric Exam: Vascular: dorsalis pedis  pulses are palpable bilateral.Posterior tibial pulses are absent. Capillary return is immediate. Temperature gradient is WNL. Skin turgor WNL  Sensorium: Diminished  Semmes Weinstein monofilament test. Normal tactile sensation bilaterally. Nail Exam: Pt has thick disfigured discolored nails with subungual debris noted bilateral entire nail hallux through fifth toenails Ulcer Exam: There is no evidence of ulcer or pre-ulcerative changes or infection. Orthopedic Exam: Muscle tone and strength are WNL. No limitations in general ROM. No crepitus or effusions noted. Foot type and digits show no abnormalities. Bony prominences are unremarkable. Skin: No Porokeratosis. No infection or ulcers.  Subungual keratosis 4th right.  Diagnosis:  Onychomycosis, , Pain in right toe, pain in left toes  Treatment & Plan Procedures and Treatment: Consent by patient was obtained for treatment procedures.   Debridement of mycotic and hypertrophic toenails, 1 through 5 bilateral and clearing of subungual debris. No ulceration, no infection noted. Debride keratosis/DSD. Return Visit-Office Procedure: Patient instructed to return to the office for a follow up visit 3 months for continued evaluation and treatment.    [name] DPM

## 2019-07-26 ENCOUNTER — Other Ambulatory Visit: Payer: Self-pay | Admitting: Family Medicine

## 2019-08-06 ENCOUNTER — Ambulatory Visit (INDEPENDENT_AMBULATORY_CARE_PROVIDER_SITE_OTHER): Payer: Medicare Other | Admitting: Family Medicine

## 2019-08-06 ENCOUNTER — Ambulatory Visit: Payer: Self-pay

## 2019-08-06 ENCOUNTER — Encounter: Payer: Self-pay | Admitting: Family Medicine

## 2019-08-06 ENCOUNTER — Other Ambulatory Visit: Payer: Self-pay

## 2019-08-06 VITALS — BP 122/70 | HR 65 | Ht 72.0 in

## 2019-08-06 DIAGNOSIS — I2510 Atherosclerotic heart disease of native coronary artery without angina pectoris: Secondary | ICD-10-CM

## 2019-08-06 DIAGNOSIS — M999 Biomechanical lesion, unspecified: Secondary | ICD-10-CM | POA: Diagnosis not present

## 2019-08-06 DIAGNOSIS — M62838 Other muscle spasm: Secondary | ICD-10-CM | POA: Diagnosis not present

## 2019-08-06 DIAGNOSIS — M25552 Pain in left hip: Secondary | ICD-10-CM | POA: Diagnosis not present
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
## 2019-08-06 NOTE — Patient Instructions (Signed)
Injected the piriformis today Will see you back in 6 weeks

## 2019-08-08 ENCOUNTER — Encounter: Payer: Self-pay | Admitting: Family Medicine

## 2019-08-08 NOTE — Assessment & Plan Note (Signed)
Decision today to treat with OMT was based on Physical Exam  After verbal consent patient was treated with  ME, FPR techniques in  thoracic,areas  Patient tolerated the procedure well with improvement in symptoms  Patient given exercises, stretches and lifestyle modifications  See medications in patient instructions if given  Patient will follow up in 6 weeks

## 2019-08-08 NOTE — Assessment & Plan Note (Signed)
Patient was given injection, tolerated the procedure well, discussed which activities to do which wants to avoid.  Patient should increase activity slowly.  Discussed icing regimen and home exercises.  If this does not seem to help then further work-up of the lumbar spine will be necessary.

## 2019-08-12 ENCOUNTER — Ambulatory Visit: Payer: Medicare Other | Admitting: Cardiovascular Disease

## 2019-08-25 ENCOUNTER — Encounter: Payer: Self-pay | Admitting: Family Medicine

## 2019-08-26 ENCOUNTER — Other Ambulatory Visit: Payer: Self-pay

## 2019-08-26 DIAGNOSIS — H9190 Unspecified hearing loss, unspecified ear: Secondary | ICD-10-CM

## 2019-09-16 ENCOUNTER — Encounter: Payer: Self-pay | Admitting: Family Medicine

## 2019-09-17 ENCOUNTER — Ambulatory Visit (INDEPENDENT_AMBULATORY_CARE_PROVIDER_SITE_OTHER): Payer: Medicare Other

## 2019-09-17 ENCOUNTER — Other Ambulatory Visit: Payer: Self-pay

## 2019-09-17 ENCOUNTER — Ambulatory Visit (INDEPENDENT_AMBULATORY_CARE_PROVIDER_SITE_OTHER): Payer: Medicare Other | Admitting: Family Medicine

## 2019-09-17 VITALS — Ht 72.0 in | Wt 192.0 lb

## 2019-09-17 DIAGNOSIS — M25552 Pain in left hip: Secondary | ICD-10-CM | POA: Diagnosis not present

## 2019-09-17 DIAGNOSIS — M1612 Unilateral primary osteoarthritis, left hip: Secondary | ICD-10-CM | POA: Diagnosis not present

## 2019-09-17 DIAGNOSIS — M62838 Other muscle spasm: Secondary | ICD-10-CM

## 2019-09-17 DIAGNOSIS — M545 Low back pain: Secondary | ICD-10-CM | POA: Diagnosis not present

## 2019-09-17 DIAGNOSIS — M5030 Other cervical disc degeneration, unspecified cervical region: Secondary | ICD-10-CM | POA: Diagnosis not present

## 2019-09-17 DIAGNOSIS — M999 Biomechanical lesion, unspecified: Secondary | ICD-10-CM

## 2019-09-17 IMAGING — DX DG LUMBAR SPINE 2-3V
3 series · 3 of 3 positions shown · non-contrast
Comparison: CT abdomen pelvis-[DATE]

CLINICAL DATA: Chronic low back pain.  No known injury.

EXAM:
LUMBAR SPINE - 2-3 VIEW

[lumbar spine ap]
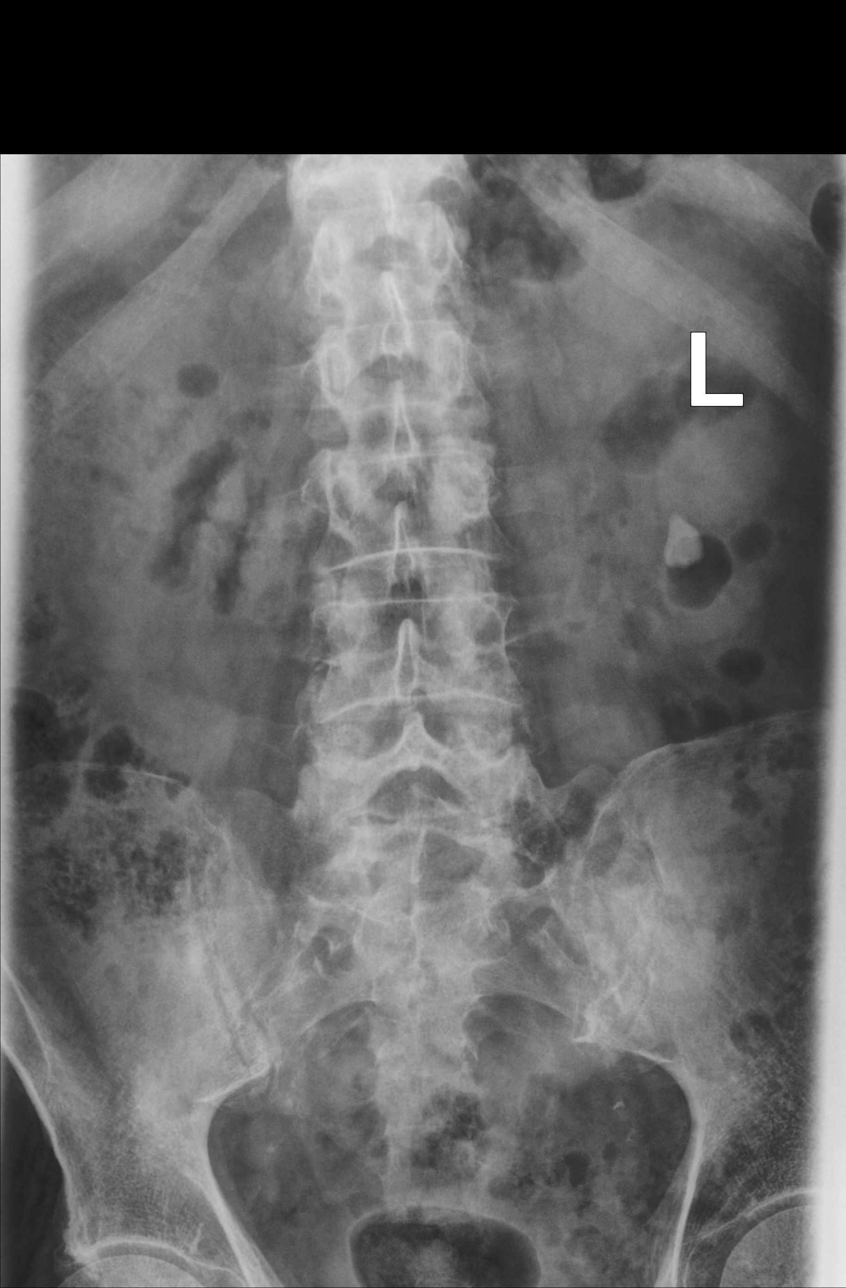

[lumbar spine lat (1 of 2)]
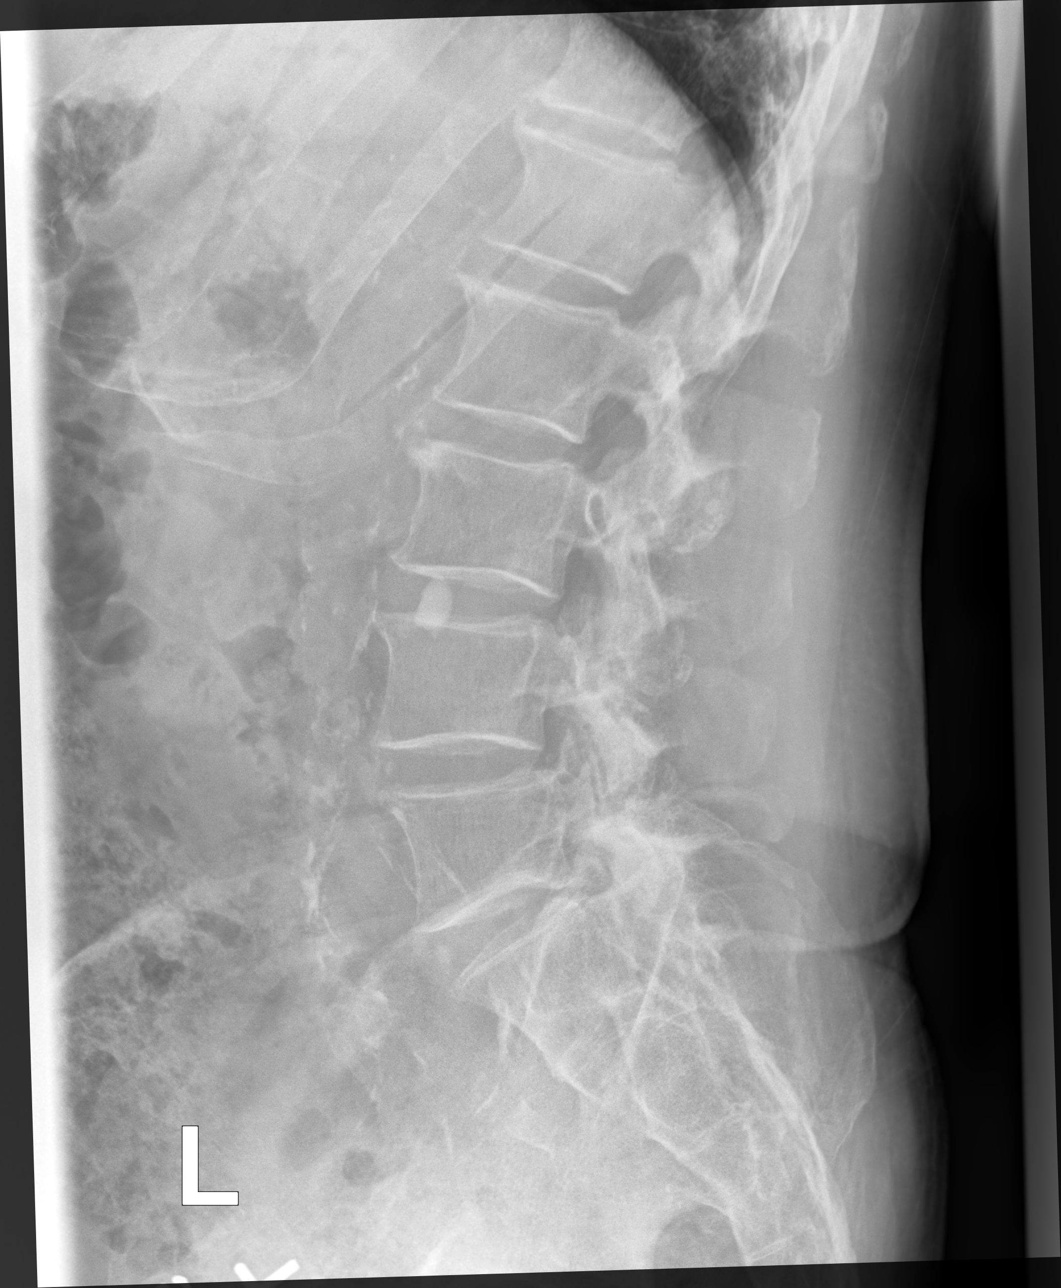

[lumbar spine lat (2 of 2)]
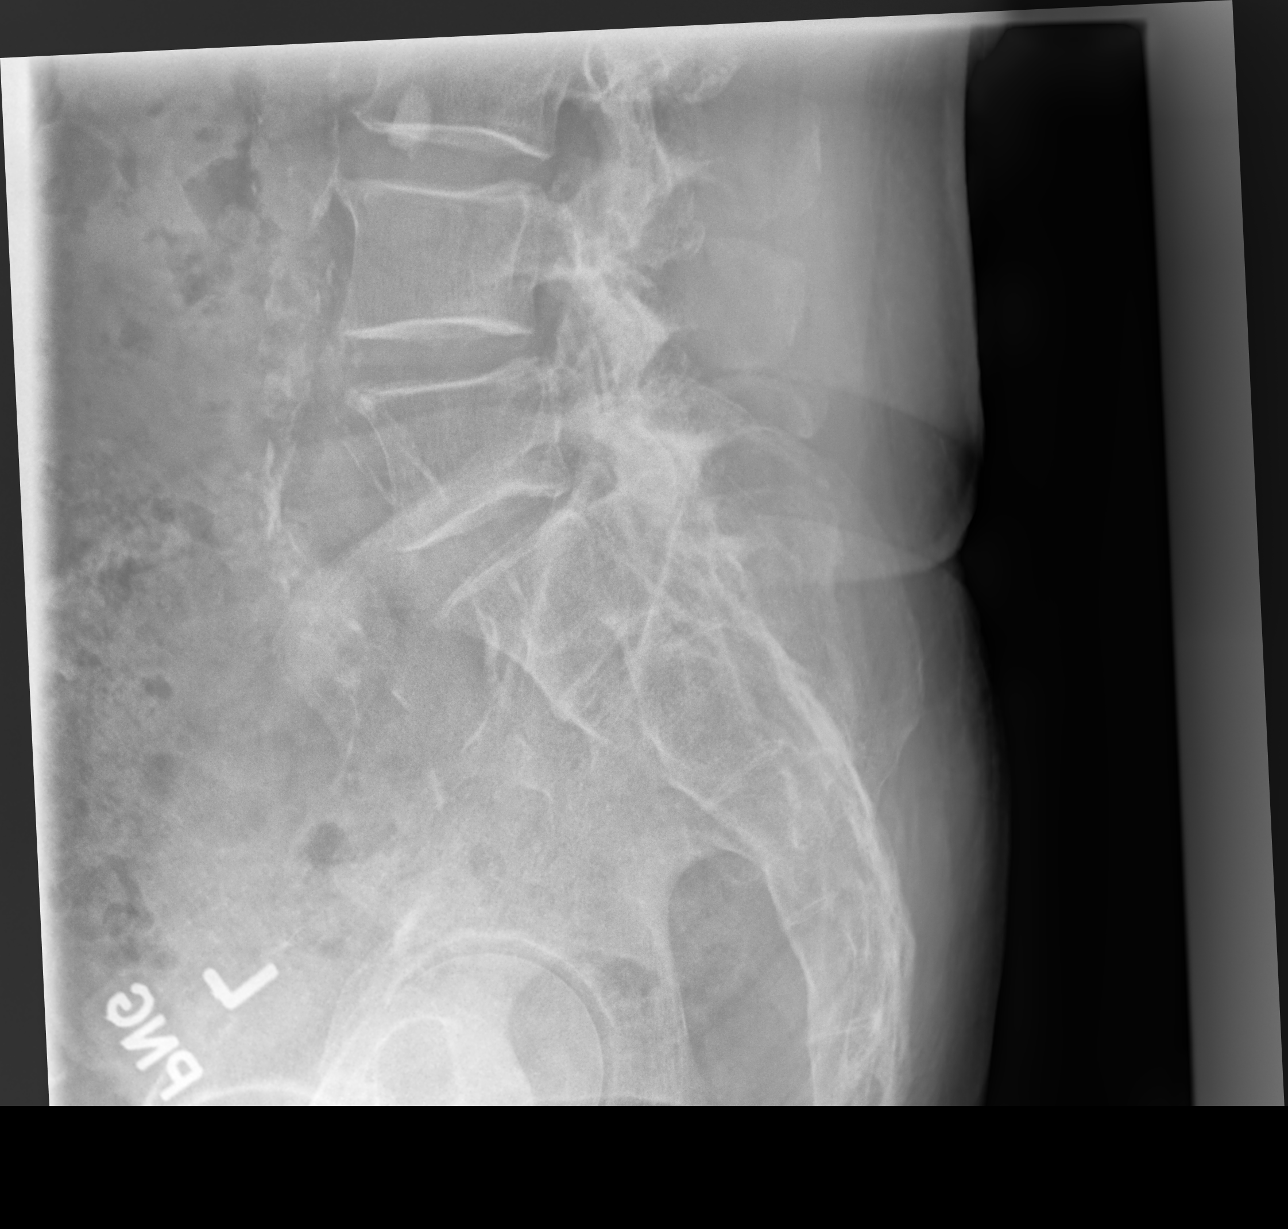

[3 of 3 positions shown; findings below may reference images not displayed]

FINDINGS: There are 5 non rib-bearing lumbar type vertebral bodies. Normal
alignment of the lumbar spine. No anterolisthesis or retrolisthesis

Lumbar vertebral body heights appear preserved

Mild multilevel lumbar spine DDD, worse at L1-L2 and L2-L3, similar
to remote abdominal CT performed [DATE]

Limited visualization of the bilateral SI joints is normal.

There is an approximately 1.6 x 1.2 cm calcification overlying
expected location of the inferior pole the left kidney compatible
with the known nonobstructing stone demonstrated on remote abdominal
CT.

Atherosclerotic plaque within the abdominal aorta

Regional bowel gas pattern is normal.
IMPRESSION: 1. Mild multilevel lumbar spine DDD, worse at L1-L2 and L2-L3,
similar to remote abdominal CT performed [DATE].
2. Approximately 1.6 cm left-sided renal stone, similar to remote
abdominal CT.
3.  Aortic Atherosclerosis ([AN]-[AN]).

## 2019-09-17 IMAGING — DX DG HIP (WITH OR WITHOUT PELVIS) 2-3V*L*
3 series · 3 of 3 positions shown · non-contrast
Comparison: CT abdomen pelvis-[DATE]

CLINICAL DATA: Chronic left hip pain.  No known injury.

EXAM:
DG HIP (WITH OR WITHOUT PELVIS) 2-3V LEFT

[pelvis ap]
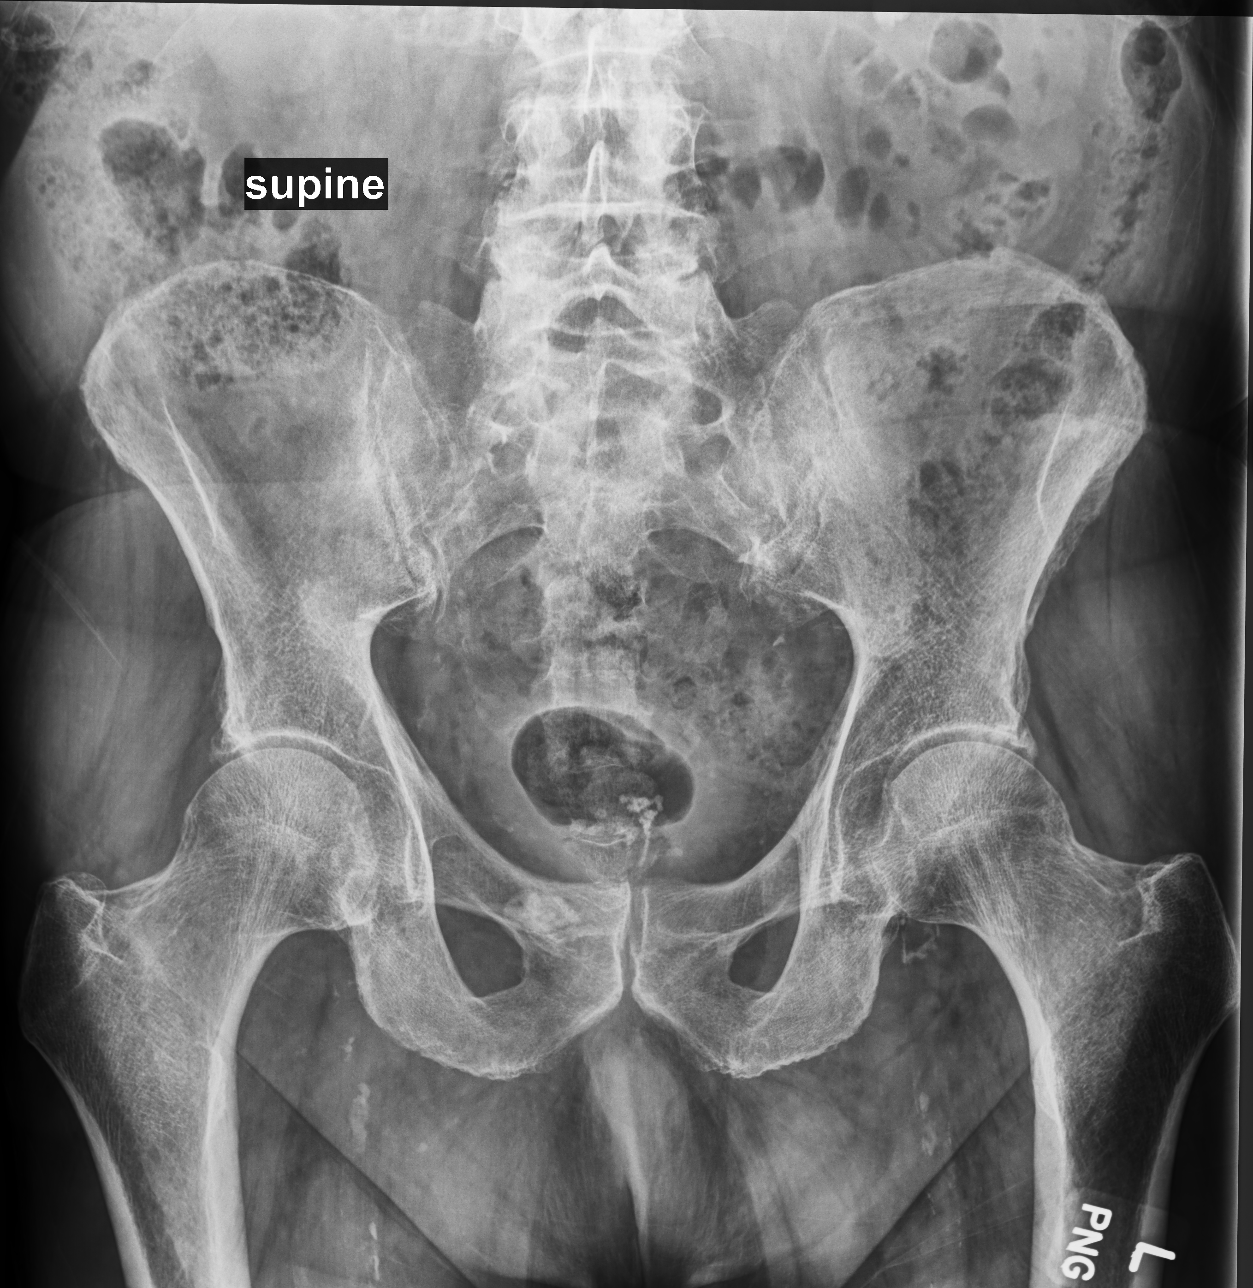

[hip ap]
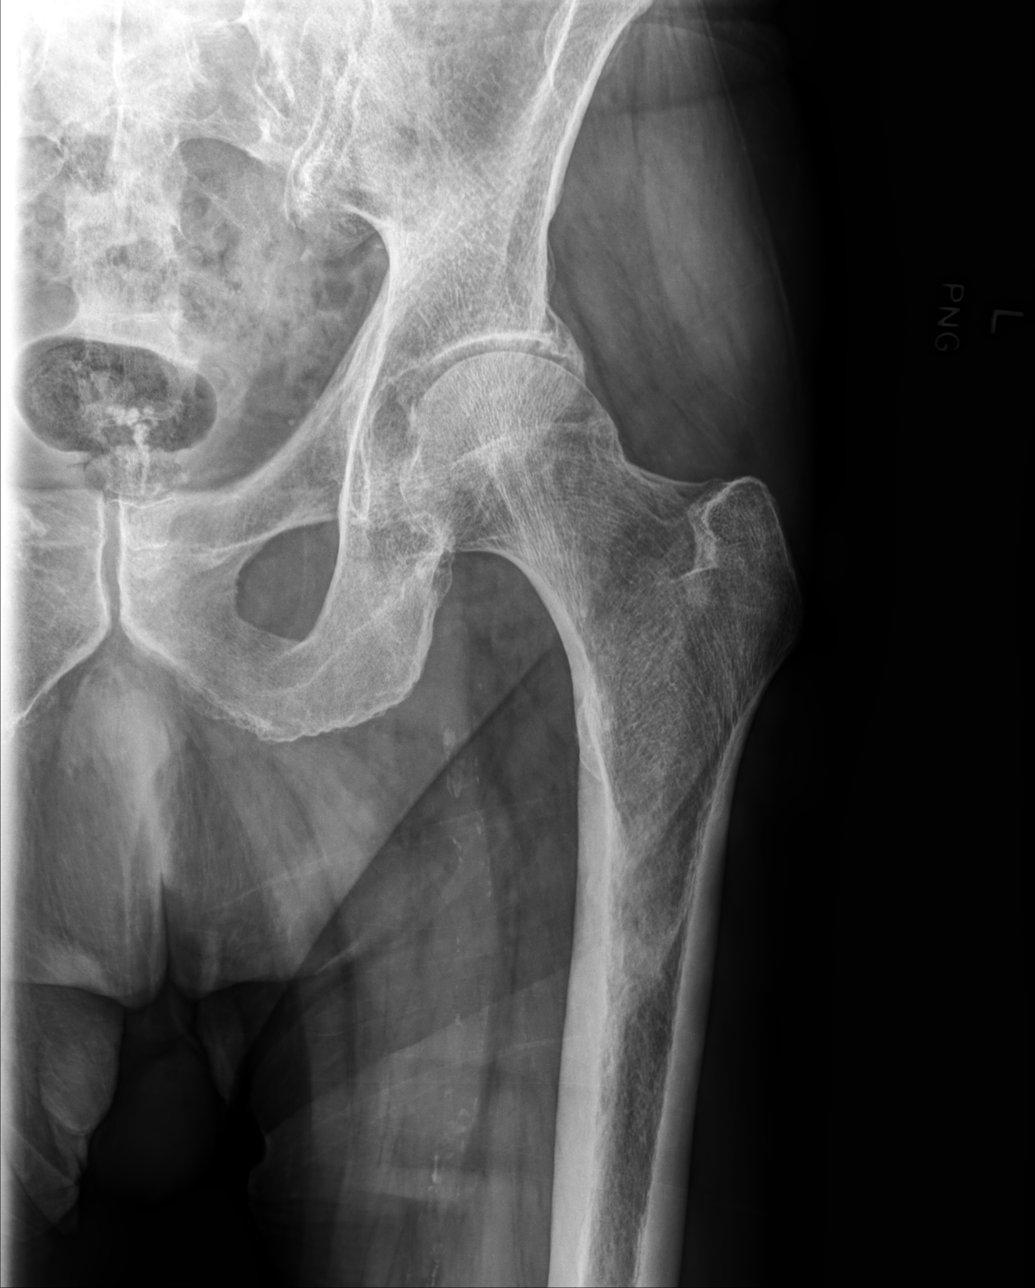

[hip (frog leg)]
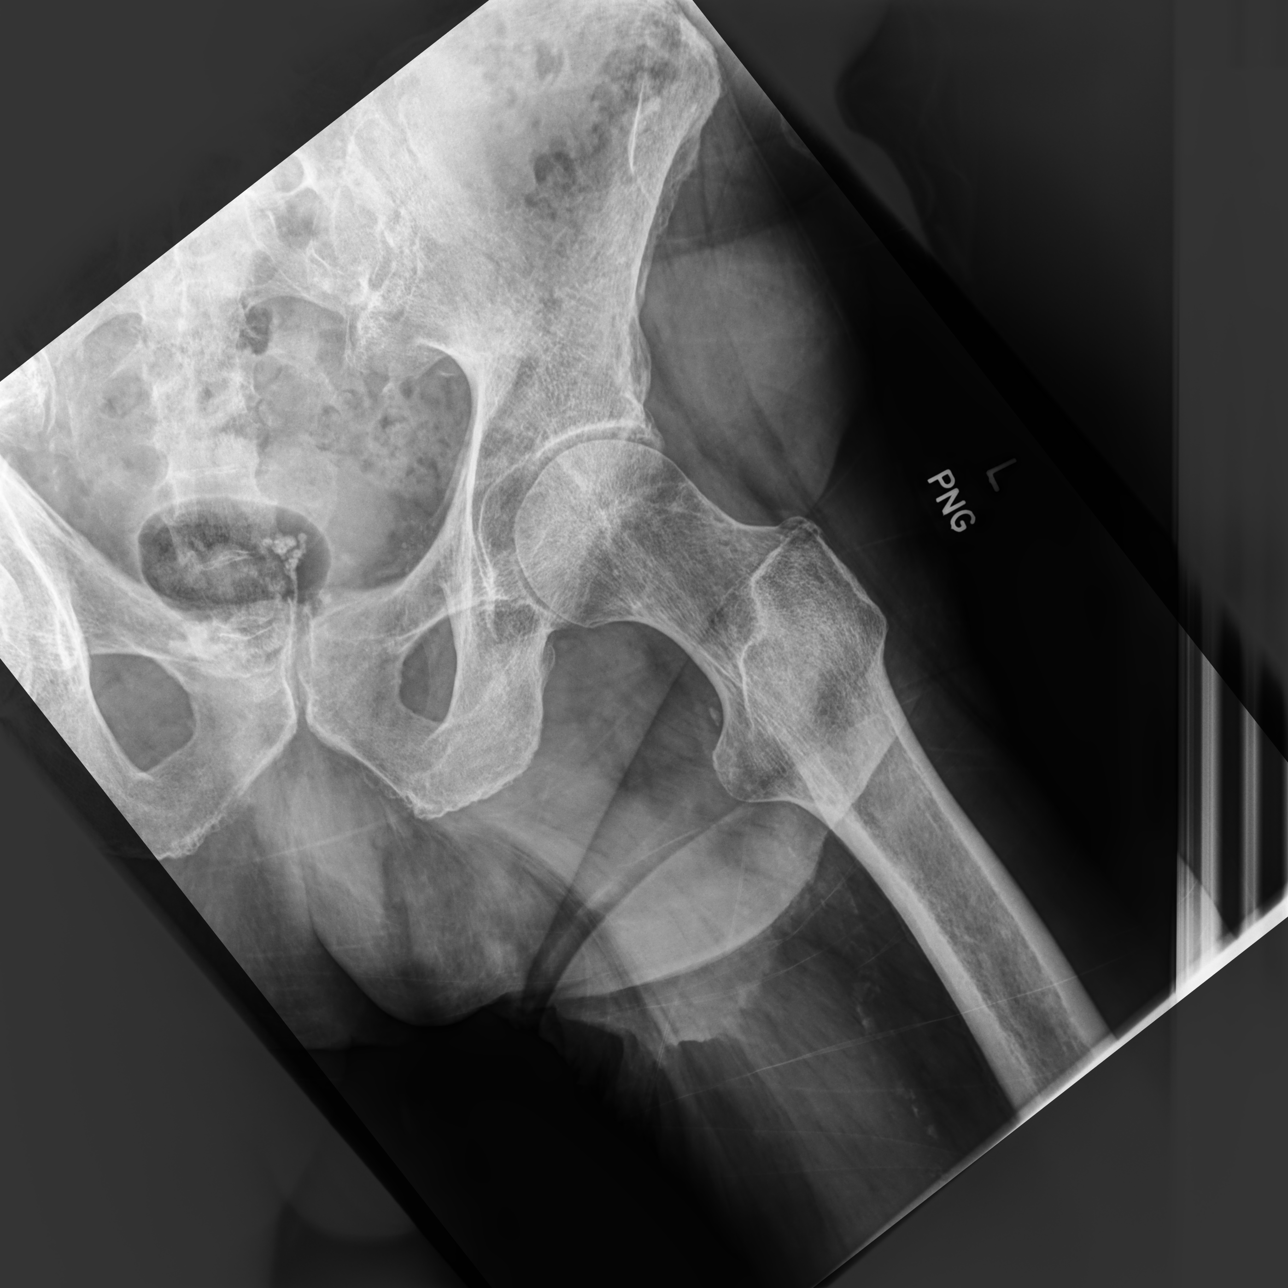

[3 of 3 positions shown; findings below may reference images not displayed]

FINDINGS: No fracture or dislocation. Mild degenerative change the left hip
with joint space loss, subchondral sclerosis and osteophytosis. No
evidence of avascular necrosis

Limited visualization of the pelvis demonstrates a bone island
within the right superior pubic ramus. Similar mild degenerative
changes suspected within the contralateral right hip is suspected
though incompletely evaluated.

Dystrophic calcifications overlie the expected location of the
prostate gland, similar to remote abdominal CT. Vascular
calcifications overlie the pelvis and proximal thighs bilaterally.
Regional bowel gas pattern is normal. No radiopaque foreign body.
IMPRESSION: Mild degenerative change of the left hip.

## 2019-09-17 NOTE — Patient Instructions (Addendum)
Xrays today See me again in 6-8 weeks
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
## 2019-09-18 ENCOUNTER — Encounter: Payer: Self-pay | Admitting: Family Medicine

## 2019-09-18 NOTE — Assessment & Plan Note (Signed)
Degenerative disc disease of the cervical spine.  Stable.  Attempted osteopathic manipulation again of the upper thoracic spine.  We will continue conservative therapy.  Follow-up again in 6 to 8 weeks

## 2019-09-18 NOTE — Assessment & Plan Note (Signed)
Patient did have improvement continues to have some difficulties though.  Discussed that this could be more of a lumbar radiculopathy and x-rays of the lumbar spine ordered today and will be evaluated later.  Discussed potentially adding other medications which patient declined.  Patient is trying to increase his walking and intake we can do this slowly.  If any increasing weakness or worsening pain consider advanced imaging.  Follow-up again in 6 weeks

## 2019-09-18 NOTE — Assessment & Plan Note (Signed)
Decision today to treat with OMT was based on Physical Exam  After verbal consent patient was treated with HVLA, ME, FPR techniques in  thoracic,  areas  Patient tolerated the procedure well with improvement in symptoms  Patient given exercises, stretches and lifestyle modifications  See medications in patient instructions if given  Patient will follow up in 4-8 weeks

## 2019-09-29 ENCOUNTER — Other Ambulatory Visit: Payer: Self-pay | Admitting: Family Medicine

## 2019-10-01 ENCOUNTER — Encounter: Payer: Self-pay | Admitting: Podiatry

## 2019-10-01 ENCOUNTER — Other Ambulatory Visit: Payer: Self-pay

## 2019-10-01 ENCOUNTER — Ambulatory Visit (INDEPENDENT_AMBULATORY_CARE_PROVIDER_SITE_OTHER): Payer: Medicare Other | Admitting: Podiatry

## 2019-10-01 DIAGNOSIS — M79674 Pain in right toe(s): Secondary | ICD-10-CM

## 2019-10-01 DIAGNOSIS — B351 Tinea unguium: Secondary | ICD-10-CM | POA: Diagnosis not present

## 2019-10-01 DIAGNOSIS — E1151 Type 2 diabetes mellitus with diabetic peripheral angiopathy without gangrene: Secondary | ICD-10-CM

## 2019-10-01 DIAGNOSIS — D689 Coagulation defect, unspecified: Secondary | ICD-10-CM

## 2019-10-01 DIAGNOSIS — M79675 Pain in left toe(s): Secondary | ICD-10-CM

## 2019-10-01 NOTE — Progress Notes (Signed)
Complaint:  Visit Type: Patient returns to my office for continued preventative foot care services. Complaint: Patient states" my nails have grown long and thick and become painful to walk and wear shoes" Patient has been diagnosed with DM with no foot complications. The patient presents for preventative foot care services. No changes to ROS.  Patient is taking xarelto.  Podiatric Exam: Vascular: dorsalis pedis  pulses are palpable bilateral.Posterior tibial pulses are absent. Capillary return is immediate. Temperature gradient is WNL. Skin turgor WNL  Sensorium: Diminished  Semmes Weinstein monofilament test. Normal tactile sensation bilaterally. Nail Exam: Pt has thick disfigured discolored nails with subungual debris noted bilateral entire nail hallux through fifth toenails Ulcer Exam: There is no evidence of ulcer or pre-ulcerative changes or infection. Orthopedic Exam: Muscle tone and strength are WNL. No limitations in general ROM. No crepitus or effusions noted. Foot type and digits show no abnormalities. Bony prominences are unremarkable. Hammer toes 2-4  B/L. Skin: No Porokeratosis. No infection or ulcers.  Subungual keratosis 4th right.  Diagnosis:  Onychomycosis, , Pain in right toe, pain in left toes  Treatment & Plan Procedures and Treatment: Consent by patient was obtained for treatment procedures.   Debridement of mycotic and hypertrophic toenails, 1 through 5 bilateral and clearing of subungual debris. No ulceration, no infection noted. Patient qualifies for diabetic shoes due to DPN, pes planus and hammer toes. Return Visit-Office Procedure: Patient instructed to return to the office for a follow up visit 3 months for continued evaluation and treatment.    [name] DPM

## 2019-10-04 ENCOUNTER — Ambulatory Visit: Payer: Medicare Other | Attending: Internal Medicine

## 2019-10-04 DIAGNOSIS — Z23 Encounter for immunization: Secondary | ICD-10-CM | POA: Insufficient documentation

## 2019-10-04 NOTE — Progress Notes (Signed)
   Covid-19 Vaccination Clinic  Name:  Sean Lane    MRN: [id number] DOB: 1928/06/15  10/04/2019  [name] was observed post Covid-19 immunization for 15 minutes without incidence. He was provided with Vaccine Information Sheet and instruction to access the V-Safe system.   [name] was instructed to call 911 with any severe reactions post vaccine: Marland Kitchen Difficulty breathing  . Swelling of your face and throat  . A fast heartbeat  . A bad rash all over your body  . Dizziness and weakness    Immunizations Administered    Name Date Dose VIS Date Route   Pfizer COVID-19 Vaccine 10/04/2019  1:29 PM 0.3 mL 08/22/2019 Intramuscular   Manufacturer: [place]   Lot: [id number]   [place]: [id number]

## 2019-10-08 ENCOUNTER — Telehealth: Payer: Self-pay | Admitting: Family Medicine

## 2019-10-08 NOTE — Telephone Encounter (Signed)
I left a message asking pt and spouse to reschedule 10/15/19 AWV with Loma Sousa.

## 2019-10-13 ENCOUNTER — Encounter: Payer: Self-pay | Admitting: Family Medicine

## 2019-10-15 ENCOUNTER — Ambulatory Visit: Payer: Medicare Other

## 2019-10-21 ENCOUNTER — Ambulatory Visit (INDEPENDENT_AMBULATORY_CARE_PROVIDER_SITE_OTHER): Payer: Medicare Other

## 2019-10-21 ENCOUNTER — Other Ambulatory Visit: Payer: Self-pay

## 2019-10-21 DIAGNOSIS — Z0000 Encounter for general adult medical examination without abnormal findings: Secondary | ICD-10-CM | POA: Diagnosis not present

## 2019-10-21 NOTE — Patient Instructions (Signed)
[name] , Thank you for taking time to come for your Medicare Wellness Visit. I appreciate your ongoing commitment to your health goals. Please review the following plan we discussed and let me know if I can assist you in the future.   Screening recommendations/referrals: Colorectal Screening: No longer indicated   Vision and Dental Exams: Recommended annual ophthalmology exams for early detection of glaucoma and other disorders of the eye Recommended annual dental exams for proper oral hygiene  Diabetic Exams: Diabetic Eye Exam: recommended yearly; up to date Diabetic Foot Exam: recommended yearly; up to date   Vaccinations: Influenza vaccine: completed 05/24/19 Pneumococcal vaccine: up to date; last 05/24/15 Tdap vaccine: up to date; last 10/25/11  Shingles vaccine: Please call your insurance company to determine your out of pocket expense for the Shingrix vaccine. You may receive this vaccine at your local pharmacy. (see handout)  Advanced directives: Please bring a copy of your POA (Power of Attorney) and/or Living Will to your next appointment.  Goals: Recommend to drink at least 6-8 8oz glasses of water per day and consume a balanced diet rich in fresh fruits and vegetables.   Next appointment: Please schedule your Annual Wellness Visit with your Nurse Health Advisor in one year.  Preventive Care 60 Years and Older, Male Preventive care refers to lifestyle choices and visits with your health care provider that can promote health and wellness. What does preventive care include?  A yearly physical exam. This is also called an annual well check.  Dental exams once or twice a year.  Routine eye exams. Ask your health care provider how often you should have your eyes checked.  Personal lifestyle choices, including:  Daily care of your teeth and gums.  Regular physical activity.  Eating a healthy diet.  Avoiding tobacco and drug use.  Limiting alcohol use.  Practicing  safe sex.  Taking low doses of aspirin every day if recommended by your health care provider..  Taking vitamin and mineral supplements as recommended by your health care provider. What happens during an annual well check? The services and screenings done by your health care provider during your annual well check will depend on your age, overall health, lifestyle risk factors, and family history of disease. Counseling  Your health care provider may ask you questions about your:  Alcohol use.  Tobacco use.  Drug use.  Emotional well-being.  Home and relationship well-being.  Sexual activity.  Eating habits.  History of falls.  Memory and ability to understand (cognition).  Work and work Statistician. Screening  You may have the following tests or measurements:  Height, weight, and BMI.  Blood pressure.  Lipid and cholesterol levels. These may be checked every 5 years, or more frequently if you are over 71 years old.  Skin check.  Lung cancer screening. You may have this screening every year starting at age 80 if you have a 30-pack-year history of smoking and currently smoke or have quit within the past 15 years.  Fecal occult blood test (FOBT) of the stool. You may have this test every year starting at age 79.  Flexible sigmoidoscopy or colonoscopy. You may have a sigmoidoscopy every 5 years or a colonoscopy every 10 years starting at age 2.  Prostate cancer screening. Recommendations will vary depending on your family history and other risks.  Hepatitis C blood test.  Hepatitis B blood test.  Sexually transmitted disease (STD) testing.  Diabetes screening. This is done by checking your blood sugar (glucose)  after you have not eaten for a while (fasting). You may have this done every 1-3 years.  Abdominal aortic aneurysm (AAA) screening. You may need this if you are a current or former smoker.  Osteoporosis. You may be screened starting at age 9 if you are at  high risk. Talk with your health care provider about your test results, treatment options, and if necessary, the need for more tests. Vaccines  Your health care provider may recommend certain vaccines, such as:  Influenza vaccine. This is recommended every year.  Tetanus, diphtheria, and acellular pertussis (Tdap, Td) vaccine. You may need a Td booster every 10 years.  Zoster vaccine. You may need this after age 24.  Pneumococcal 13-valent conjugate (PCV13) vaccine. One dose is recommended after age 42.  Pneumococcal polysaccharide (PPSV23) vaccine. One dose is recommended after age 71. Talk to your health care provider about which screenings and vaccines you need and how often you need them. This information is not intended to replace advice given to you by your health care provider. Make sure you discuss any questions you have with your health care provider. Document Released: 09/24/2015 Document Revised: 05/17/2016 Document Reviewed: 06/29/2015 Elsevier Interactive Patient Education  2017 [employer] Prevention in the Home Falls can cause injuries. They can happen to people of all ages. There are many things you can do to make your home safe and to help prevent falls. What can I do on the outside of my home?  Regularly fix the edges of walkways and driveways and fix any cracks.  Remove anything that might make you trip as you walk through a door, such as a raised step or threshold.  Trim any bushes or trees on the path to your home.  Use bright outdoor lighting.  Clear any walking paths of anything that might make someone trip, such as rocks or tools.  Regularly check to see if handrails are loose or broken. Make sure that both sides of any steps have handrails.  Any raised decks and porches should have guardrails on the edges.  Have any leaves, snow, or ice cleared regularly.  Use sand or salt on walking paths during winter.  Clean up any spills in your garage  right away. This includes oil or grease spills. What can I do in the bathroom?  Use night lights.  Install grab bars by the toilet and in the tub and shower. Do not use towel bars as grab bars.  Use non-skid mats or decals in the tub or shower.  If you need to sit down in the shower, use a plastic, non-slip stool.  Keep the floor dry. Clean up any water that spills on the floor as soon as it happens.  Remove soap buildup in the tub or shower regularly.  Attach bath mats securely with double-sided non-slip rug tape.  Do not have throw rugs and other things on the floor that can make you trip. What can I do in the bedroom?  Use night lights.  Make sure that you have a light by your bed that is easy to reach.  Do not use any sheets or blankets that are too big for your bed. They should not hang down onto the floor.  Have a firm chair that has side arms. You can use this for support while you get dressed.  Do not have throw rugs and other things on the floor that can make you trip. What can I do in the kitchen?  Clean up any spills right away.  Avoid walking on wet floors.  Keep items that you use a lot in easy-to-reach places.  If you need to reach something above you, use a strong step stool that has a grab bar.  Keep electrical cords out of the way.  Do not use floor polish or wax that makes floors slippery. If you must use wax, use non-skid floor wax.  Do not have throw rugs and other things on the floor that can make you trip. What can I do with my stairs?  Do not leave any items on the stairs.  Make sure that there are handrails on both sides of the stairs and use them. Fix handrails that are broken or loose. Make sure that handrails are as long as the stairways.  Check any carpeting to make sure that it is firmly attached to the stairs. Fix any carpet that is loose or worn.  Avoid having throw rugs at the top or bottom of the stairs. If you do have throw rugs,  attach them to the floor with carpet tape.  Make sure that you have a light switch at the top of the stairs and the bottom of the stairs. If you do not have them, ask someone to add them for you. What else can I do to help prevent falls?  Wear shoes that:  Do not have high heels.  Have rubber bottoms.  Are comfortable and fit you well.  Are closed at the toe. Do not wear sandals.  If you use a stepladder:  Make sure that it is fully opened. Do not climb a closed stepladder.  Make sure that both sides of the stepladder are locked into place.  Ask someone to hold it for you, if possible.  Clearly mark and make sure that you can see:  Any grab bars or handrails.  First and last steps.  Where the edge of each step is.  Use tools that help you move around (mobility aids) if they are needed. These include:  Canes.  Walkers.  Scooters.  Crutches.  Turn on the lights when you go into a dark area. Replace any light bulbs as soon as they burn out.  Set up your furniture so you have a clear path. Avoid moving your furniture around.  If any of your floors are uneven, fix them.  If there are any pets around you, be aware of where they are.  Review your medicines with your doctor. Some medicines can make you feel dizzy. This can increase your chance of falling. Ask your doctor what other things that you can do to help prevent falls. This information is not intended to replace advice given to you by your health care provider. Make sure you discuss any questions you have with your health care provider. Document Released: 06/24/2009 Document Revised: 02/03/2016 Document Reviewed: 10/02/2014 Elsevier Interactive Patient Education  2017 [employer].
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
## 2019-10-25 ENCOUNTER — Ambulatory Visit: Payer: Medicare Other | Attending: Internal Medicine

## 2019-10-25 DIAGNOSIS — Z23 Encounter for immunization: Secondary | ICD-10-CM

## 2019-10-25 NOTE — Progress Notes (Signed)
   Covid-19 Vaccination Clinic  Name:  Sean Lane    MRN: [id number] DOB: 02-Jun-1928  10/25/2019  [name] was observed post Covid-19 immunization for 15 minutes without incidence. He was provided with Vaccine Information Sheet and instruction to access the V-Safe system.   [name] was instructed to call 911 with any severe reactions post vaccine: Marland Kitchen Difficulty breathing  . Swelling of your face and throat  . A fast heartbeat  . A bad rash all over your body  . Dizziness and weakness    Immunizations Administered    Name Date Dose VIS Date Route   Pfizer COVID-19 Vaccine 10/25/2019 12:26 PM 0.3 mL 08/22/2019 Intramuscular   Manufacturer: [place]   Lot: [id number]   Index: [id number]

## 2019-11-05 ENCOUNTER — Encounter: Payer: Self-pay | Admitting: Family Medicine

## 2019-11-05 ENCOUNTER — Ambulatory Visit (INDEPENDENT_AMBULATORY_CARE_PROVIDER_SITE_OTHER): Payer: Medicare Other | Admitting: Family Medicine

## 2019-11-05 ENCOUNTER — Other Ambulatory Visit: Payer: Self-pay

## 2019-11-05 DIAGNOSIS — M62838 Other muscle spasm: Secondary | ICD-10-CM | POA: Diagnosis not present

## 2019-11-05 DIAGNOSIS — M999 Biomechanical lesion, unspecified: Secondary | ICD-10-CM

## 2019-11-05 DIAGNOSIS — M5030 Other cervical disc degeneration, unspecified cervical region: Secondary | ICD-10-CM | POA: Diagnosis not present

## 2019-11-05 NOTE — Patient Instructions (Addendum)
Good to see you  See me again in 6 weeks Keep using walking sticks

## 2019-11-05 NOTE — Assessment & Plan Note (Signed)
Decision today to treat with OMT was based on Physical Exam  After verbal consent patient was treated with  ME, FPR techniques in  thoracicareas  Patient tolerated the procedure well with improvement in symptoms  Patient given exercises, stretches and lifestyle modifications  See medications in patient instructions if given  Patient will follow up in 4-8 weeks

## 2019-11-05 NOTE — Assessment & Plan Note (Signed)
Degenerative disc disease.  Discussed which activities to do which wants to avoid.  Discussed which activities to do which wants to avoid.  Patient is to increase activity slowly.  Discussed icing regimen and home exercise.  Responding fairly well to manipulation.  Follow-up again in 6 weeks

## 2019-11-05 NOTE — Assessment & Plan Note (Signed)
Making some progression.  Likely some lumbar radiculopathy.  Discussed which activities to do which wants to avoid.  Discussed icing regimen and home exercises.  Follow-up again in 4 to 8 weeks
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
## 2019-11-06 DIAGNOSIS — E119 Type 2 diabetes mellitus without complications: Secondary | ICD-10-CM | POA: Diagnosis not present

## 2019-11-26 ENCOUNTER — Encounter: Payer: Self-pay | Admitting: Family Medicine

## 2019-11-28 ENCOUNTER — Other Ambulatory Visit: Payer: Self-pay

## 2019-11-28 ENCOUNTER — Encounter: Payer: Self-pay | Admitting: Physician Assistant

## 2019-11-28 ENCOUNTER — Ambulatory Visit (INDEPENDENT_AMBULATORY_CARE_PROVIDER_SITE_OTHER): Payer: Medicare Other | Admitting: Physician Assistant

## 2019-11-28 DIAGNOSIS — R0981 Nasal congestion: Secondary | ICD-10-CM

## 2019-11-28 MED ORDER — IPRATROPIUM BROMIDE 0.03 % NA SOLN: 2.0000 | Freq: Two times a day (BID) | NASAL | 1 refills | Status: DC

## 2019-11-28 MED ORDER — DOXYCYCLINE HYCLATE 100 MG PO TABS: 100.0000 mg | ORAL_TABLET | Freq: Two times a day (BID) | ORAL | 0 refills | Status: DC

## 2019-11-28 NOTE — Progress Notes (Signed)
TELEPHONE ENCOUNTER   Patient verbally agreed to telephone visit and is aware that copayment and coinsurance may apply. Patient was treated using telemedicine according to accepted telemedicine protocols.  Location of the patient: home Location of provider: [place] office Names of all persons participating in the telemedicine service and role in the encounter: Inda Coke, [place] , Damien Fusi  Subjective:   Chief Complaint  Patient presents with  . Sinus Problem     HPI   Sinus problem Pt c/o sinus congestion, head congestion, clear nasal drainage, sneezing, and drainage down back of throat x 2-3 weeks.  Denies fever, chills, SOB, chest pain, chest tightness. He is using Delsym cough syrup with relief, is taking as needed. Completed both COVID-19 shots in February.  Appetite is normal. He and wife rarely go out during the pandemic, even after vaccination. He is using Flonase 1 spray daily.  Patient Active Problem List   Diagnosis Date Noted  . Spasm of left piriformis muscle 06/27/2019  . Degenerative disc disease, cervical 05/28/2019  . Nonallopathic lesion of thoracic region 05/28/2019  . Thrombocytopenia ([place]) 12/04/2016  . Abnormal nuclear stress test 10/26/2016  . Preoperative cardiovascular examination 10/26/2016  . CAD S/P percutaneous coronary angioplasty 10/26/2016  . Diverticulitis of colon 11/22/2015  . Nephrolithiasis 05/07/2014  . Gout attack 12/14/2012  . Type 2 diabetes mellitus with CAD(HCC) 12/14/2012  . Hematuria 03/28/2012  . Atrial flutter ([place]) 10/03/2011  . SINUS BRADYCARDIA 07/20/2010  . BPH associated with nocturia 06/08/2009  . RBBB 12/28/2008  . GERD 12/15/2008  . DIZZINESS 12/15/2008  . CAD (coronary artery disease) 12/17/2007  . LOSS, CONDUCTIVE HEARING, COMBINED TYPE 04/16/2007  . Hyperlipidemia 04/02/2007  . Essential hypertension 04/02/2007  . Allergic rhinitis 04/02/2007  . OA (osteoarthritis) of knee 04/02/2007    Social History   Tobacco Use  . Smoking status: Former Smoker    Quit date: 09/11/1961    Years since quitting: 58.2  . Smokeless tobacco: Never Used  Substance Use Topics  . Alcohol use: No    Current Outpatient Medications:  .  acetaminophen (TYLENOL) 650 MG CR tablet, Take 1,300 mg by mouth every 8 (eight) hours as needed for pain., Disp: , Rfl:  .  aspirin EC 81 MG tablet, Take 81 mg by mouth daily., Disp: , Rfl:  .  CREAM BASE EX, Apply 1 application topically at bedtime. Diabetics' Dry Skin Relief (applied to feet), Disp: , Rfl:  .  dutasteride (AVODART) 0.5 MG capsule, TAKE 1 CAPSULE DAILY, Disp: 90 capsule, Rfl: 3 .  fluticasone (FLONASE) 50 MCG/ACT nasal spray, Place 2 sprays into both nostrils daily., Disp: 16 g, Rfl: 3 .  FREESTYLE INSULINX TEST test strip, USE TO CHECK BLOOD SUGAR DAILY AND AS NEEDED DIAG CODE E11.9, Disp: 100 strip, Rfl: 4 .  Lancets (FREESTYLE) lancets, E11.9 -CHECK BLOOD SUGAR ONCE DAILY, Disp: 100 each, Rfl: 4 .  losartan (COZAAR) 25 MG tablet, TAKE 1 TABLET BY MOUTH EVERY DAY, Disp: 90 tablet, Rfl: 3 .  Naphazoline-Pheniramine (OPCON-A OP), Apply 1 drop to eye daily as needed (allergies)., Disp: , Rfl:  .  nitroGLYCERIN (NITROSTAT) 0.4 MG SL tablet, Place 1 tablet (0.4 mg total) under the tongue every 5 (five) minutes as needed for chest pain (for chest pain). Use up to 3 dosages, if no relief call 911., Disp: 75 tablet, Rfl: 1 .  NON FORMULARY, [facility]  Peripheral Neuropathy Cream- Bupivacaine 1%, Doxepin 3%, Gabapentin 6%, Pentoxifylline 3%, Topiramate 1%  Apply 1-2 grams to affected area 3-4 times daily Qty. 120 gm 3 refills, Disp: , Rfl:  .  Omega-3 Fatty Acids (FISH OIL) 1200 MG CAPS, Take 2 capsules by mouth 2 (two) times daily., Disp: , Rfl:  .  oxymetazoline (AFRIN) 0.05 % nasal spray, Place 1 spray into both nostrils 2 (two) times daily as needed for congestion., Disp: , Rfl:  .  rosuvastatin (CRESTOR) 10 MG tablet, TAKE 1 TABLET DAILY,  Disp: 90 tablet, Rfl: 3 .  tamsulosin (FLOMAX) 0.4 MG CAPS capsule, TAKE 1 CAPSULE DAILY, Disp: 90 capsule, Rfl: 1 .  XARELTO 20 MG TABS tablet, TAKE 1 TABLET DAILY WITH   SUPPER, Disp: 90 tablet, Rfl: 2 .  doxycycline (VIBRA-TABS) 100 MG tablet, Take 1 tablet (100 mg total) by mouth 2 (two) times daily., Disp: 20 tablet, Rfl: 0 .  ipratropium (ATROVENT) 0.03 % nasal spray, Place 2 sprays into both nostrils every 12 (twelve) hours., Disp: 30 mL, Rfl: 1 Allergies  Allergen Reactions  . Hydromorphone Hcl Other (See Comments)     very agitated. With dilaudid  . Ketoconazole Rash    Rash on feet with use  . Sulfonamide Derivatives Swelling and Rash    Assessment & Plan:   1. Sinus congestion    No red flags on exam.  Will initiate atrovent nasal spray per orders. I recommended holding the flonase for now. I have also given him a safety net prescription for doxycycline if he feels like his symptoms do not improve with this change in nasal spray.  Discussed taking medications as prescribed. Reviewed return precautions including development of fever, SOB, worsening cough or other concerns. Push fluids and rest. I recommend that patient follow-up if symptoms worsen or persist despite treatment x 7-10 days, sooner if needed.   No orders of the defined types were placed in this encounter.  Meds ordered this encounter  Medications  . ipratropium (ATROVENT) 0.03 % nasal spray    Sig: Place 2 sprays into both nostrils every 12 (twelve) hours.    Dispense:  30 mL    Refill:  1    Order Specific Question:   Supervising Provider    Answer:   Vivi Barrack [id number]  . doxycycline (VIBRA-TABS) 100 MG tablet    Sig: Take 1 tablet (100 mg total) by mouth 2 (two) times daily.    Dispense:  20 tablet    Refill:  0    Order Specific Question:   Supervising Provider    Answer:   Maryruth Eve    Inda Coke, PA 11/28/2019  Time spent with the patient: 8 minutes, spent in obtaining  information about his symptoms, reviewing his previous labs, evaluations, and treatments, counseling him about his condition (please see the discussed topics above), and developing a plan to further investigate it; he had a number of questions which I addressed.

## 2019-12-01 ENCOUNTER — Telehealth: Payer: Self-pay | Admitting: Family Medicine

## 2019-12-01 NOTE — Telephone Encounter (Signed)
Patient wife called in and stated the patient is having Nose bleeds with the nasal spray that was prescribed and wants to know if he should be only using it once a day instead of twice a day.

## 2019-12-01 NOTE — Telephone Encounter (Signed)
Please advise 

## 2019-12-01 NOTE — Telephone Encounter (Signed)
Spoke to pt's wife [name], he is having nose bleeds since starting on  Atrovent nasal spray on Friday at least once a day and had one during the night which was significant. Put [name] on hold and Discussed with Samantha. Told her discussed with Aldona Bar and she said for pt to stop Atrovent and not use anything for a day or two and then can start the Flonase he has. [name] verbalized understanding.

## 2019-12-17 ENCOUNTER — Other Ambulatory Visit: Payer: Self-pay

## 2019-12-17 ENCOUNTER — Ambulatory Visit (INDEPENDENT_AMBULATORY_CARE_PROVIDER_SITE_OTHER): Payer: Medicare Other | Admitting: Family Medicine

## 2019-12-17 ENCOUNTER — Encounter: Payer: Self-pay | Admitting: Family Medicine

## 2019-12-17 DIAGNOSIS — M999 Biomechanical lesion, unspecified: Secondary | ICD-10-CM | POA: Insufficient documentation

## 2019-12-17 DIAGNOSIS — M5030 Other cervical disc degeneration, unspecified cervical region: Secondary | ICD-10-CM | POA: Diagnosis not present
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
## 2019-12-17 NOTE — Assessment & Plan Note (Addendum)
   Decision today to treat with OMT was based on Physical Exam  After verbal consent patient was treated with HVLA, ME, FPR techniques in  thoracic, lumbar, sacral areas, areas are chronic   Patient tolerated the procedure well with improvement in symptoms  Patient given exercises, stretches and lifestyle modifications  See medications in patient instructions if given  Patient will follow up in 4-8 weeks

## 2019-12-17 NOTE — Assessment & Plan Note (Signed)
Continues degenerative disc disease.  Continue to have exacerbation of this chronic problem.  Very difficult to treat secondary to patient's age, chronic comorbidities, do not feel comfortable giving him any medications and patient does not want that area as well.  Patient is going to increase activity very slowly when possible.  Follow-up again in 4 to 8 weeks.

## 2019-12-17 NOTE — Patient Instructions (Signed)
See me again in 6-7 weeks ?

## 2019-12-20 ENCOUNTER — Other Ambulatory Visit: Payer: Self-pay | Admitting: Physician Assistant

## 2019-12-21 ENCOUNTER — Encounter: Payer: Self-pay | Admitting: Family Medicine

## 2019-12-22 ENCOUNTER — Other Ambulatory Visit: Payer: Self-pay

## 2019-12-22 DIAGNOSIS — L989 Disorder of the skin and subcutaneous tissue, unspecified: Secondary | ICD-10-CM

## 2019-12-23 DIAGNOSIS — Z20828 Contact with and (suspected) exposure to other viral communicable diseases: Secondary | ICD-10-CM | POA: Diagnosis not present

## 2019-12-23 DIAGNOSIS — Z03818 Encounter for observation for suspected exposure to other biological agents ruled out: Secondary | ICD-10-CM | POA: Diagnosis not present

## 2019-12-29 ENCOUNTER — Other Ambulatory Visit: Payer: Self-pay

## 2019-12-29 ENCOUNTER — Ambulatory Visit (INDEPENDENT_AMBULATORY_CARE_PROVIDER_SITE_OTHER): Payer: Medicare Other | Admitting: Podiatry

## 2019-12-29 ENCOUNTER — Other Ambulatory Visit: Payer: Self-pay | Admitting: Family Medicine

## 2019-12-29 DIAGNOSIS — M79675 Pain in left toe(s): Secondary | ICD-10-CM | POA: Diagnosis not present

## 2019-12-29 DIAGNOSIS — M79674 Pain in right toe(s): Secondary | ICD-10-CM

## 2019-12-29 DIAGNOSIS — L600 Ingrowing nail: Secondary | ICD-10-CM

## 2019-12-29 DIAGNOSIS — B351 Tinea unguium: Secondary | ICD-10-CM | POA: Diagnosis not present

## 2019-12-29 MED ORDER — GENTAMICIN SULFATE 0.1 % EX CREA: 1.0000 "application " | TOPICAL_CREAM | Freq: Two times a day (BID) | CUTANEOUS | 1 refills | Status: DC

## 2019-12-29 NOTE — Patient Instructions (Signed)

## 2019-12-30 ENCOUNTER — Encounter: Payer: Self-pay | Admitting: Family Medicine

## 2019-12-30 ENCOUNTER — Ambulatory Visit (INDEPENDENT_AMBULATORY_CARE_PROVIDER_SITE_OTHER): Payer: Medicare Other | Admitting: Family Medicine

## 2019-12-30 VITALS — BP 120/68 | HR 60 | Temp 98.6°F | Ht 72.0 in | Wt 193.0 lb

## 2019-12-30 DIAGNOSIS — E785 Hyperlipidemia, unspecified: Secondary | ICD-10-CM | POA: Diagnosis not present

## 2019-12-30 DIAGNOSIS — D696 Thrombocytopenia, unspecified: Secondary | ICD-10-CM | POA: Diagnosis not present

## 2019-12-30 DIAGNOSIS — R5383 Other fatigue: Secondary | ICD-10-CM

## 2019-12-30 DIAGNOSIS — I4892 Unspecified atrial flutter: Secondary | ICD-10-CM

## 2019-12-30 DIAGNOSIS — I10 Essential (primary) hypertension: Secondary | ICD-10-CM

## 2019-12-30 DIAGNOSIS — I2510 Atherosclerotic heart disease of native coronary artery without angina pectoris: Secondary | ICD-10-CM

## 2019-12-30 DIAGNOSIS — E1159 Type 2 diabetes mellitus with other circulatory complications: Secondary | ICD-10-CM | POA: Diagnosis not present

## 2019-12-30 MED ORDER — TRAMADOL HCL 50 MG PO TABS: 25.0000 mg | ORAL_TABLET | Freq: Two times a day (BID) | ORAL | 0 refills | Status: DC | PRN

## 2019-12-30 NOTE — Patient Instructions (Addendum)
Continue claritin through allergy season  Try tramadol half pill to start if pain is not bearable on Tylenol-use this sparingly perhaps if pain gets above 5 out of 10   Recommended follow up: Return in about 14 weeks (around 04/06/2020) for follow up- or sooner if needed.  Please stop by lab before you go If you do not have mychart- we will call you about results within 5 business days of [place] receiving them.  If you have mychart- we will send your results within 3 business days of [place] receiving them.  If abnormal or we want to clarify a result, we will call or mychart you to make sure you receive the message.  If you have questions or concerns or don't hear within 5 business days, please send [place] a message or call us.

## 2019-12-30 NOTE — Progress Notes (Signed)
Phone [phone] In person visit   Subjective:   PINK MOREIN is a 86 y.o. year old very pleasant male patient who presents for/with See problem oriented charting Chief Complaint  Patient presents with  . Follow-up  . not feeling   This visit occurred during the SARS-CoV-2 public health emergency.  Safety protocols were in place, including screening questions prior to the visit, additional usage of staff PPE, and extensive cleaning of exam room while observing appropriate contact time as indicated for disinfecting solutions.   Past Medical History-  Patient Active Problem List   Diagnosis Date Noted  . Type 2 diabetes mellitus with CAD(HCC) 12/14/2012    Priority: High  . Atrial flutter ([place]) 10/03/2011    Priority: High  . CAD (coronary artery disease) 12/17/2007    Priority: High  . SINUS BRADYCARDIA 07/20/2010    Priority: Medium  . BPH associated with nocturia 06/08/2009    Priority: Medium  . DIZZINESS 12/15/2008    Priority: Medium  . Hyperlipidemia 04/02/2007    Priority: Medium  . Essential hypertension 04/02/2007    Priority: Medium  . Nephrolithiasis 05/07/2014    Priority: Low  . Gout attack 12/14/2012    Priority: Low  . Hematuria 03/28/2012    Priority: Low  . RBBB 12/28/2008    Priority: Low  . GERD 12/15/2008    Priority: Low  . LOSS, CONDUCTIVE HEARING, COMBINED TYPE 04/16/2007    Priority: Low  . Allergic rhinitis 04/02/2007    Priority: Low  . OA (osteoarthritis) of knee 04/02/2007    Priority: Low  . Nonallopathic lesion of sacral region 12/17/2019  . Nonallopathic lesion of lumbosacral region 12/17/2019  . Spasm of left piriformis muscle 06/27/2019  . Degenerative disc disease, cervical 05/28/2019  . Nonallopathic lesion of thoracic region 05/28/2019  . Thrombocytopenia ([place]) 12/04/2016  . Abnormal nuclear stress test 10/26/2016  . Preoperative cardiovascular examination 10/26/2016  . CAD S/P percutaneous coronary angioplasty  10/26/2016  . Diverticulitis of colon 11/22/2015    Medications- reviewed and updated Current Outpatient Medications  Medication Sig Dispense Refill  . acetaminophen (TYLENOL) 650 MG CR tablet Take 1,300 mg by mouth every 8 (eight) hours as needed for pain.    Marland Kitchen aspirin EC 81 MG tablet Take 81 mg by mouth daily.    Marland Kitchen CREAM BASE EX Apply 1 application topically at bedtime. Diabetics' Dry Skin Relief (applied to feet)    . dutasteride (AVODART) 0.5 MG capsule TAKE 1 CAPSULE DAILY 90 capsule 3  . fluticasone (FLONASE) 50 MCG/ACT nasal spray Place 2 sprays into both nostrils daily. 16 g 3  . FREESTYLE INSULINX TEST test strip USE TO CHECK BLOOD SUGAR DAILY AND AS NEEDED DIAG CODE E11.9 100 strip 4  . gentamicin cream (GARAMYCIN) 0.1 % Apply 1 application topically 2 (two) times daily. 15 g 1  . Lancets (FREESTYLE) lancets E11.9 -CHECK BLOOD SUGAR ONCE DAILY 100 each 4  . losartan (COZAAR) 25 MG tablet TAKE 1 TABLET BY MOUTH EVERY DAY 90 tablet 3  . Naphazoline-Pheniramine (OPCON-A OP) Apply 1 drop to eye daily as needed (allergies).    . nitroGLYCERIN (NITROSTAT) 0.4 MG SL tablet Place 1 tablet (0.4 mg total) under the tongue every 5 (five) minutes as needed for chest pain (for chest pain). Use up to 3 dosages, if no relief call 911. 75 tablet 1  . NON FORMULARY [facility]  Peripheral Neuropathy Cream- Bupivacaine 1%, Doxepin 3%, Gabapentin 6%, Pentoxifylline 3%, Topiramate 1% Apply 1-2 grams  to affected area 3-4 times daily Qty. 120 gm 3 refills    . Omega-3 Fatty Acids (FISH OIL) 1200 MG CAPS Take 2 capsules by mouth 2 (two) times daily.    . rosuvastatin (CRESTOR) 10 MG tablet TAKE 1 TABLET DAILY 90 tablet 3  . tamsulosin (FLOMAX) 0.4 MG CAPS capsule TAKE 1 CAPSULE DAILY 90 capsule 1  . XARELTO 20 MG TABS tablet TAKE 1 TABLET DAILY WITH   SUPPER 90 tablet 2  . traMADol (ULTRAM) 50 MG tablet Take 0.5-1 tablets (25-50 mg total) by mouth 2 (two) times daily as needed for moderate pain  or severe pain. 20 tablet 0   No current facility-administered medications for this visit.     Objective:  BP 120/68   Pulse 60   Temp 98.6 F (37 C)   Ht 6' (1.829 m)   Wt 193 lb (87.5 kg)   SpO2 97%   BMI 26.18 kg/m  Gen: NAD, resting comfortably CV: Bradycardic but regular Lungs: CTAB no crackles, wheeze, rhonchi Abdomen: soft/nontender/nondistended Ext: no edema Skin: warm, dry MSK: 2- 3 by  2- 3 mm growth on dorsal surface of hand-freely mobile and seems superficial-appears to have burst blood vessel close to this    Assessment and Plan   # hand growth  He also has a growth on his left hand that has doubled in size in a year that he would like for you to look at. He has an appointment with Derm in June 2nd with Dr. Denna Haggard. He usually sees Dr. Ronnald Ramp [place] derm - encouraged him to keep appointment  # Fatigue #allergies # low back pain S:ongoing fatigue issues and pain likely contributes.  Got tested for covid due to fatigue and was negative.   Patient on tylenol around the clock to take edge off thoracic back pain. Coughing or sneezing worsens his pain. Some adjustments with Dr. Tamala Julian have been helpful. Back pain improved when he had injection into his hip. Prednisone also helps his pain.  No arm or leg weakness.  No incontinence.  Denies falls or injuries  Had seen Inda Coke for nasal congestion-was started on ipratropium nasal spray-unfortunately this caused significant nosebleeds and this was stopped. He is back on flonase without nosebleeds. Nasal congestion issues are better- he never took the antibiotics prescribed. Sparing claritin- thinks he would feel better on this but didn't know could take more regularly.  A/P: We will update labs in regards to fatigue-his primary issue seems to more the thoracic back pain-he has been working with Dr. Tamala Julian and having some adjustment/OMT for this and finds this helpful.  For the most part his pain is controlled with Tylenol  extended release-at present pain 2 out of 10 but toward the end of the Tylenol pain can significantly increase-I gave him some tramadol to trial.  If he has any falls or confusion on this this will need to be stopped. -Allergy listed to Dilaudid in the past but I think he will likely be able to tolerate tramadol  Please also see problem-oriented charting under diagnosis codes under labs/orders below  Recommended follow up: Return in about 14 weeks (around 04/06/2020) for follow up- or sooner if needed. Future Appointments  Date Time Provider [place]  01/12/2020  1:15 PM Edrick Kins, DPM [facility]  01/29/2020  3:15 PM [name], DO [facility] None  02/11/2020  2:15 PM Arlyss Gandy, PA-C [facility]  03/10/2020  1:15 PM [name], DPM [facility]  Lab/Order associations:   ICD-10-CM   1. Type 2 diabetes mellitus with other circulatory complication, without long-term current use of insulin (HCC)  E11.59 CBC with Differential/Platelet  Has been diet controlled in the past.  Update A1c today-suspect controlled  Comprehensive metabolic panel    Lipid panel    Hemoglobin A1c  2. Coronary artery disease involving native coronary artery of native heart without angina pectoris   Keep close follow-up with cardiology-doubt fatigue is related to CAD.  No reported chest pain or shortness of breath.  We will update lipid panel today.  Continue aspirin and statin with LDL goal under 70 I25.10   3. Atrial flutter, unspecified type ([place])   No rate control required due to sinus bradycardia.  Patient is appropriately anticoagulated with Xarelto.  Update CBC to make sure no anemia in regards to fatigue I48.92   4. Essential hypertension   Well-controlled on losartan 25 mg.  Very mild orthostasis at times-cardiology has wanted him on this.  I recommended if he is concerned about lower blood pressures to take a weekly log into next cardiology visit.  For now  continue current medicine. I10   5. Hyperlipidemia, unspecified hyperlipidemia type   Update lipid panel today.  Hopefully controlled with LDL at least under 70 E78.5   6. Thrombocytopenia (HCC)   Stable thrombocytopenia in the past-update CBC with labs particular with fatigue D69.6   7. Fatigue, unspecified type  R53.83 TSH    Meds ordered this encounter  Medications  . traMADol (ULTRAM) 50 MG tablet    Sig: Take 0.5-1 tablets (25-50 mg total) by mouth 2 (two) times daily as needed for moderate pain or severe pain.    Dispense:  20 tablet    Refill:  0   Return precautions advised.  [name], MD

## 2019-12-31 ENCOUNTER — Ambulatory Visit: Payer: Medicare Other | Admitting: Podiatry

## 2019-12-31 LAB — COMPREHENSIVE METABOLIC PANEL
ALT: 10 U/L (ref 0–53)
AST: 13 U/L (ref 0–37)
Albumin: 4.4 g/dL (ref 3.5–5.2)
Alkaline Phosphatase: 84 U/L (ref 39–117)
BUN: 18 mg/dL (ref 6–23)
CO2: 29 mEq/L (ref 19–32)
Calcium: 10 mg/dL (ref 8.4–10.5)
Chloride: 104 mEq/L (ref 96–112)
Creatinine, Ser: 0.92 mg/dL (ref 0.40–1.50)
GFR: 77.37 mL/min (ref >60.00)
Glucose, Bld: 138 mg/dL — ABNORMAL HIGH (ref 70–99)
Potassium: 4.6 mEq/L (ref 3.5–5.1)
Sodium: 141 mEq/L (ref 135–145)
Total Bilirubin: 0.5 mg/dL (ref 0.2–1.2)
Total Protein: 6.5 g/dL (ref 6.0–8.3)

## 2019-12-31 LAB — TSH: TSH: 0.99 u[IU]/mL (ref 0.35–4.50)

## 2019-12-31 LAB — LIPID PANEL
Cholesterol: 113 mg/dL (ref 0–200)
HDL: 37.4 mg/dL — ABNORMAL LOW (ref >39.00)
LDL Cholesterol: 43 mg/dL (ref 0–99)
NonHDL: 75.37
Total CHOL/HDL Ratio: 3
Triglycerides: 161 mg/dL — ABNORMAL HIGH (ref 0.0–149.0)
VLDL: 32.2 mg/dL (ref 0.0–40.0)

## 2019-12-31 LAB — HEMOGLOBIN A1C: Hgb A1c MFr Bld: 5.9 % (ref 4.6–6.5)
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
## 2020-01-01 LAB — CBC WITH DIFFERENTIAL/PLATELET
Basophils Absolute: 0 10*3/uL (ref 0.0–0.1)
Basophils Relative: 1 % (ref 0.0–3.0)
Eosinophils Absolute: 0.1 10*3/uL (ref 0.0–0.7)
Eosinophils Relative: 1.1 % (ref 0.0–5.0)
HCT: 39.9 % (ref 39.0–52.0)
Hemoglobin: 13.4 g/dL (ref 13.0–17.0)
Lymphocytes Relative: 24.8 % (ref 12.0–46.0)
Lymphs Abs: 1.2 10*3/uL (ref 0.7–4.0)
MCHC: 33.7 g/dL (ref 30.0–36.0)
MCV: 91.7 fl (ref 78.0–100.0)
Monocytes Absolute: 0.1 10*3/uL (ref 0.1–1.0)
Monocytes Relative: 2.7 % — ABNORMAL LOW (ref 3.0–12.0)
Neutro Abs: 3.3 10*3/uL (ref 1.4–7.7)
Neutrophils Relative %: 70.4 % (ref 43.0–77.0)
Platelets: 195 10*3/uL (ref 150.0–400.0)
RBC: 4.35 Mil/uL (ref 4.22–5.81)
RDW: 15.4 % (ref 11.5–15.5)
WBC: 4.6 10*3/uL (ref 4.0–10.5)

## 2020-01-12 ENCOUNTER — Ambulatory Visit (INDEPENDENT_AMBULATORY_CARE_PROVIDER_SITE_OTHER): Payer: Medicare Other | Admitting: Podiatry

## 2020-01-12 ENCOUNTER — Other Ambulatory Visit: Payer: Self-pay

## 2020-01-12 DIAGNOSIS — B351 Tinea unguium: Secondary | ICD-10-CM | POA: Diagnosis not present

## 2020-01-12 DIAGNOSIS — L600 Ingrowing nail: Secondary | ICD-10-CM

## 2020-01-12 DIAGNOSIS — M79675 Pain in left toe(s): Secondary | ICD-10-CM | POA: Diagnosis not present

## 2020-01-12 DIAGNOSIS — M79674 Pain in right toe(s): Secondary | ICD-10-CM
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
## 2020-01-15 ENCOUNTER — Encounter: Payer: Self-pay | Admitting: Podiatry

## 2020-01-15 ENCOUNTER — Other Ambulatory Visit: Payer: Self-pay

## 2020-01-15 ENCOUNTER — Ambulatory Visit (INDEPENDENT_AMBULATORY_CARE_PROVIDER_SITE_OTHER): Payer: Medicare Other | Admitting: Podiatry

## 2020-01-15 VITALS — Temp 97.8°F

## 2020-01-15 DIAGNOSIS — L03031 Cellulitis of right toe: Secondary | ICD-10-CM | POA: Diagnosis not present

## 2020-01-15 DIAGNOSIS — I2510 Atherosclerotic heart disease of native coronary artery without angina pectoris: Secondary | ICD-10-CM | POA: Diagnosis not present

## 2020-01-15 MED ORDER — DOXYCYCLINE HYCLATE 100 MG PO TABS: 100.0000 mg | ORAL_TABLET | Freq: Two times a day (BID) | ORAL | 0 refills | Status: DC

## 2020-01-18 NOTE — Progress Notes (Signed)
Subjective:   Patient ID: Sean Lane, male   DOB: 86 y.o.   MRN: [id number]   HPI Patient states that his toe was bright red yesterday and he was concerned because he does have diabetes even though it is under excellent control.  He has been using gentamicin and is wondering if he may have some low-grade allergy to this neuro   ROS      Objective:  Physical Exam  Vascular status intact with patient's left hallux to the inner phalangeal joint showing mild to moderate redness but localized with no drainage noted currently or no proximal edema erythema or drainage noted     Assessment:  Goal to say whether this could be a low-grade localized infection versus the possibility for a allergic reaction     Plan:  Discussed both options and we will stop the gentamicin currently and is precautionary measure I placed him on doxycycline twice daily gave instructions for soaks with either Epson salts or with liquid bacterial soap and if any further redness or any systemic signs of infection were to occur he is to contact us immediately but I am hopeful this will resolve uneventfully

## 2020-01-29 ENCOUNTER — Encounter: Payer: Self-pay | Admitting: Family Medicine

## 2020-01-29 ENCOUNTER — Other Ambulatory Visit: Payer: Self-pay

## 2020-01-29 ENCOUNTER — Ambulatory Visit (INDEPENDENT_AMBULATORY_CARE_PROVIDER_SITE_OTHER): Payer: Medicare Other | Admitting: Family Medicine

## 2020-01-29 VITALS — BP 108/58 | HR 64 | Ht 72.0 in | Wt 193.0 lb

## 2020-01-29 DIAGNOSIS — I2510 Atherosclerotic heart disease of native coronary artery without angina pectoris: Secondary | ICD-10-CM | POA: Diagnosis not present

## 2020-01-29 DIAGNOSIS — M5030 Other cervical disc degeneration, unspecified cervical region: Secondary | ICD-10-CM

## 2020-01-29 DIAGNOSIS — M999 Biomechanical lesion, unspecified: Secondary | ICD-10-CM | POA: Diagnosis not present

## 2020-01-29 DIAGNOSIS — M62838 Other muscle spasm: Secondary | ICD-10-CM

## 2020-01-29 DIAGNOSIS — M2550 Pain in unspecified joint: Secondary | ICD-10-CM

## 2020-01-29 LAB — URIC ACID: Uric Acid, Serum: 5.4 mg/dL (ref 4.0–7.8)

## 2020-01-29 LAB — SEDIMENTATION RATE: Sed Rate: 13 mm/hr (ref 0–20)

## 2020-01-29 MED ORDER — DULOXETINE HCL 20 MG PO CPEP
20.0000 mg | ORAL_CAPSULE | Freq: Every day | ORAL | 0 refills | Status: DC
Start: 2020-01-29 — End: 2020-02-20

## 2020-01-29 MED ORDER — KETOROLAC TROMETHAMINE 30 MG/ML IJ SOLN
30.0000 mg | Freq: Once | INTRAMUSCULAR | Status: AC
  Administered 2020-01-29: 30 mg via INTRAMUSCULAR

## 2020-01-29 MED ORDER — METHYLPREDNISOLONE ACETATE 40 MG/ML IJ SUSP
40.0000 mg | Freq: Once | INTRAMUSCULAR | Status: AC
  Administered 2020-01-29: 40 mg via INTRAMUSCULAR

## 2020-01-29 NOTE — Patient Instructions (Signed)
Labs today Cymbalta 20mg   See me again in 6 weeks

## 2020-01-29 NOTE — Assessment & Plan Note (Signed)

## 2020-01-29 NOTE — Assessment & Plan Note (Signed)
Mild exacerbation.  Discussed medications again.  Discussed patient's tramadol.  Patient has been from primary care provider.  We discussed with him not to mix it with.  Discussed icing regimen and home exercise, continue with the mild osteopathic manipulation.  Follow-up again in 6 to 8 weeks
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
## 2020-01-29 NOTE — Assessment & Plan Note (Signed)
Continues to have severe pain.  Patient is having difficulty even with activities of daily living at the moment.  30 mg of Toradol given 1 time today.  Patient is on Xarelto but has been doing relatively well and the kidney function is doing well as well.  Patient will avoid all other anti-inflammatories.  Discussed icing regimen and some exercises.  Cymbalta started.  We will see if this makes any mild improvement with some of the pain that is chronic.  Due to other comorbidities had difficulty treating with any other medications.  Follow-up again 6 weeks

## 2020-01-30 LAB — CYCLIC CITRUL PEPTIDE ANTIBODY, IGG: Cyclic Citrullin Peptide Ab: <16 UNITS

## 2020-02-11 ENCOUNTER — Encounter: Payer: Self-pay | Admitting: Physician Assistant

## 2020-02-11 ENCOUNTER — Other Ambulatory Visit: Payer: Self-pay

## 2020-02-11 ENCOUNTER — Ambulatory Visit (INDEPENDENT_AMBULATORY_CARE_PROVIDER_SITE_OTHER): Payer: Medicare Other | Admitting: Physician Assistant

## 2020-02-11 DIAGNOSIS — D229 Melanocytic nevi, unspecified: Secondary | ICD-10-CM

## 2020-02-11 DIAGNOSIS — D485 Neoplasm of uncertain behavior of skin: Secondary | ICD-10-CM | POA: Diagnosis not present

## 2020-02-11 DIAGNOSIS — L820 Inflamed seborrheic keratosis: Secondary | ICD-10-CM

## 2020-02-11 DIAGNOSIS — L720 Epidermal cyst: Secondary | ICD-10-CM | POA: Diagnosis not present

## 2020-02-11 DIAGNOSIS — I2510 Atherosclerotic heart disease of native coronary artery without angina pectoris: Secondary | ICD-10-CM | POA: Diagnosis not present

## 2020-02-11 DIAGNOSIS — D225 Melanocytic nevi of trunk: Secondary | ICD-10-CM | POA: Diagnosis not present

## 2020-02-11 HISTORY — DX: Melanocytic nevi, unspecified: D22.9

## 2020-02-11 NOTE — Patient Instructions (Signed)

## 2020-02-11 NOTE — Progress Notes (Signed)
   New Patient Visit  Subjective  Sean Lane is a 86 y.o. male who presents for the following: Skin Problem (Check spot on left hand. x years. In the last few months it has gotten bigger. Check spot on right side of neck. Looks like dark keritosis. ). Spot on the neck for years. Seems to be getting larger. No pain and no bleeding. Bump on top of left hand for years. Is growing quite quickly. Started tiny. Not painful and has not bled. No history of skin cancer but did have a precancer taken off of neck years ago. Dr Arnoldo Morale removed this.    Objective  Well appearing patient in no apparent distress; mood and affect are within normal limits.  A focused examination was performed including arms, back, chest, neck. Relevant physical exam findings are noted in the Assessment and Plan.   Objective  Left Hand - Posterior: White soft nodule dorsum of hand 5 mm in size.  Images    Objective  Right Anterior Neck: Erythematous stuck-on, waxy papule or plaque.   Objective  Left Upper Back: Dark mole irregular shape and color 13.5 to angioma     Assessment & Plan  Epidermal cyst Left Hand - Posterior  Incision and Drainage - Left Hand - Posterior Location: left hand posterior  Informed Consent: Discussed risks (permanent scarring, light or dark discoloration, infection, pain, bleeding, bruising, redness, damage to adjacent structures, and recurrence of the lesion) and benefits of the procedure, as well as the alternatives.  Informed consent was obtained.  Preparation: The area was prepped with chlorhexidine.  Anesthesia: Lidocaine 1% with epinephrine  Procedure Details: An incision was made overlying the lesion. The lesion drained white, chalky cyst material.  Pieces of cyst wall were extracted.    Antibiotic ointment and a sterile pressure dressing were applied. The patient tolerated procedure well.  Total number of lesions drained: 1  Plan: The patient was instructed on  post-op care. Recommend OTC analgesia as needed for pain.   Inflamed seborrheic keratosis Right Anterior Neck  Destruction of lesion - Right Anterior Neck Complexity: simple   Destruction method: cryotherapy   Informed consent: discussed and consent obtained   Timeout:  patient name, date of birth, surgical site, and procedure verified Lesion destroyed using liquid nitrogen: Yes   Outcome: patient tolerated procedure well with no complications    Neoplasm of uncertain behavior of skin Left Upper Back  Skin / nail biopsy Type of biopsy: tangential   Informed consent: discussed and consent obtained   Timeout: patient name, date of birth, surgical site, and procedure verified   Procedure prep:  Patient was prepped and draped in usual sterile fashion (Non sterile) Prep type:  Chlorhexidine Anesthesia: the lesion was anesthetized in a standard fashion   Anesthetic:  1% lidocaine w/ epinephrine 1-100,000 local infiltration Instrument used: flexible razor blade   Outcome: patient tolerated procedure well   Post-procedure details: wound care instructions given    Specimen 1 - Surgical pathology Differential Diagnosis: atypia Check Margins: No

## 2020-02-16 ENCOUNTER — Telehealth: Payer: Self-pay

## 2020-02-16 NOTE — Telephone Encounter (Signed)
Phone call to patient with his pathology results.  Patient's results given to his wife [name].

## 2020-02-16 NOTE — Telephone Encounter (Signed)
-----   Message from Arlyss Gandy, [place] sent at 02/16/2020  8:49 AM EDT ----- Recheck 6 months

## 2020-02-20 ENCOUNTER — Other Ambulatory Visit: Payer: Self-pay | Admitting: Family Medicine

## 2020-02-24 ENCOUNTER — Encounter: Payer: Self-pay | Admitting: Family Medicine

## 2020-02-25 MED ORDER — DULOXETINE HCL 20 MG PO CPEP: ORAL_CAPSULE | ORAL | 3 refills | Status: DC

## 2020-03-05 ENCOUNTER — Other Ambulatory Visit: Payer: Self-pay | Admitting: Family Medicine

## 2020-03-09 ENCOUNTER — Ambulatory Visit (INDEPENDENT_AMBULATORY_CARE_PROVIDER_SITE_OTHER): Payer: Medicare Other | Admitting: Family Medicine

## 2020-03-09 ENCOUNTER — Encounter: Payer: Self-pay | Admitting: Family Medicine

## 2020-03-09 ENCOUNTER — Other Ambulatory Visit: Payer: Self-pay

## 2020-03-09 VITALS — BP 110/70 | HR 68 | Ht 72.0 in | Wt 195.0 lb

## 2020-03-09 DIAGNOSIS — M62838 Other muscle spasm: Secondary | ICD-10-CM | POA: Diagnosis not present

## 2020-03-09 DIAGNOSIS — I2510 Atherosclerotic heart disease of native coronary artery without angina pectoris: Secondary | ICD-10-CM | POA: Diagnosis not present

## 2020-03-09 DIAGNOSIS — M999 Biomechanical lesion, unspecified: Secondary | ICD-10-CM | POA: Diagnosis not present

## 2020-03-09 NOTE — Assessment & Plan Note (Signed)
Decision today to treat with OMT was based on Physical Exam  After verbal consent patient was treated with ME, FPR techniques in thoracic, lumbar and sacral  areas  Patient tolerated the procedure well with improvement in symptoms  Patient given exercises, stretches and lifestyle modifications  See medications in patient instructions if given  Patient will follow up in 6 weeks

## 2020-03-09 NOTE — Patient Instructions (Addendum)
Good to see you Exercise 3 times a week See me again in 6-7 weeks

## 2020-03-09 NOTE — Assessment & Plan Note (Signed)
Worsening pain again, doing better with cymbalta, no need to increase med. Discussed HEP and worked with ATC RTC in 6 weeks
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
## 2020-03-10 ENCOUNTER — Ambulatory Visit (INDEPENDENT_AMBULATORY_CARE_PROVIDER_SITE_OTHER): Payer: Medicare Other | Admitting: Podiatry

## 2020-03-10 ENCOUNTER — Other Ambulatory Visit: Payer: Self-pay

## 2020-03-10 ENCOUNTER — Encounter: Payer: Self-pay | Admitting: Podiatry

## 2020-03-10 DIAGNOSIS — M79675 Pain in left toe(s): Secondary | ICD-10-CM

## 2020-03-10 DIAGNOSIS — B351 Tinea unguium: Secondary | ICD-10-CM | POA: Diagnosis not present

## 2020-03-10 DIAGNOSIS — E1151 Type 2 diabetes mellitus with diabetic peripheral angiopathy without gangrene: Secondary | ICD-10-CM | POA: Diagnosis not present

## 2020-03-10 DIAGNOSIS — M79674 Pain in right toe(s): Secondary | ICD-10-CM

## 2020-03-10 DIAGNOSIS — D689 Coagulation defect, unspecified: Secondary | ICD-10-CM

## 2020-03-10 NOTE — Progress Notes (Signed)
This patient returns to my office for at risk foot care.  This patient requires this care by a professional since this patient will be at risk due to having type 2 diabetes and coagulation defect.  Patient is taking xarelto.  This patient presents to the office with his wife.  This patient is unable to cut nails himself since the patient cannot reach his nails.These nails are painful walking and wearing shoes.  This patient presents for at risk foot care today.  General Appearance  Alert, conversant and in no acute stress.  Vascular  Dorsalis pedis   pulses are palpable  bilaterally. Posterior tibial pulses are absent  Bilaterally. Capillary return is within normal limits  bilaterally. Temperature is within normal limits  bilaterally.  Neurologic  Senn-Weinstein monofilament wire test diminished   bilaterally. Muscle power within normal limits bilaterally.  Nails Thick disfigured discolored nails with subungual debris  from hallux to fifth toes bilaterally. Hammer toes 2-4  B/L.  Orthopedic  No limitations of motion  feet .  No crepitus or effusions noted.  No bony pathology or digital deformities noted.  Skin  normotropic skin with no porokeratosis noted bilaterally.  No signs of infections or ulcers noted.     Onychomycosis  Pain in right toes  Pain in left toes  Consent was obtained for treatment procedures.   Mechanical debridement of nails 1-5  bilaterally performed with a nail nipper.  Filed with dremel without incident.    Return office visit   3 months                   Told patient to return for periodic foot care and evaluation due to potential at risk complications.   Shamara Soza DPM  

## 2020-03-18 ENCOUNTER — Telehealth: Payer: Self-pay | Admitting: Cardiovascular Disease

## 2020-03-18 MED ORDER — LOSARTAN POTASSIUM 25 MG PO TABS: 25.0000 mg | ORAL_TABLET | Freq: Every day | ORAL | 3 refills | Status: DC

## 2020-03-18 NOTE — Telephone Encounter (Signed)
New message   Pt c/o medication issue:  1. Name of Medication: losartan (COZAAR) 25 MG tablet  2. How are you currently taking this medication (dosage and times per day)? As written  3. Are you having a reaction (difficulty breathing--STAT)? No   4. What is your medication issue? Patient needs a new prescription sent to [place], [place] to [facility]

## 2020-03-18 NOTE — Telephone Encounter (Signed)
**Note De-Identified Maureen Duesing Obfuscation** The pts wife [name] is advised that I have e-scribed the pts Losartan refill to [facility] for #90 with 3 refills. She thanked me for calling her back with update.

## 2020-03-30 ENCOUNTER — Other Ambulatory Visit: Payer: Self-pay | Admitting: Cardiovascular Disease

## 2020-04-08 ENCOUNTER — Other Ambulatory Visit: Payer: Self-pay

## 2020-04-08 ENCOUNTER — Ambulatory Visit (INDEPENDENT_AMBULATORY_CARE_PROVIDER_SITE_OTHER): Payer: Medicare Other | Admitting: Family Medicine

## 2020-04-08 ENCOUNTER — Encounter: Payer: Self-pay | Admitting: Family Medicine

## 2020-04-08 VITALS — BP 138/78 | HR 66 | Temp 97.9°F | Resp 18 | Ht 72.0 in | Wt 193.0 lb

## 2020-04-08 DIAGNOSIS — I2510 Atherosclerotic heart disease of native coronary artery without angina pectoris: Secondary | ICD-10-CM | POA: Diagnosis not present

## 2020-04-08 DIAGNOSIS — E785 Hyperlipidemia, unspecified: Secondary | ICD-10-CM

## 2020-04-08 DIAGNOSIS — I10 Essential (primary) hypertension: Secondary | ICD-10-CM

## 2020-04-08 DIAGNOSIS — E1159 Type 2 diabetes mellitus with other circulatory complications: Secondary | ICD-10-CM

## 2020-04-08 DIAGNOSIS — I4892 Unspecified atrial flutter: Secondary | ICD-10-CM | POA: Diagnosis not present

## 2020-04-08 LAB — POCT GLYCOSYLATED HEMOGLOBIN (HGB A1C): Hemoglobin A1C: 5.8 % — AB (ref 4.0–5.6)

## 2020-04-08 NOTE — Patient Instructions (Addendum)
Diabetes: A1C today was good.  We will not make any changes. Make sure you are making heathy choices with your food. We understand that exercise is hard with both covid and your pain but any exercising you can get in is better than none.    Blood pressure: continue medications if you have any readings that are high let our office know.   Cholesterol:Labs at last check looked good at last check continue.   You may want to check with cardiology to see if they have samples of the xarelto.   recommended adding an additional tylenol arthritis 650mg  8 hours after initial doe and then perhaps 8 hours later before bed.  If you have any questions or concerns before your next scheduled appointment please give our office a call.

## 2020-04-08 NOTE — Progress Notes (Signed)
Phone [phone] In person visit   Subjective:   Sean Lane is a 86 y.o. year old very pleasant male patient who presents for/with See problem oriented charting Chief Complaint  Patient presents with  . Diabetes    Blood sugars have jumped up to 120-122 since starting the Cymbalta.   This visit occurred during the SARS-CoV-2 public health emergency.  Safety protocols were in place, including screening questions prior to the visit, additional usage of staff PPE, and extensive cleaning of exam room while observing appropriate contact time as indicated for disinfecting solutions.   Past Medical History-  Patient Active Problem List   Diagnosis Date Noted  . Type 2 diabetes mellitus with CAD(HCC) 12/14/2012    Priority: High  . Atrial flutter ([place]) 10/03/2011    Priority: High  . CAD (coronary artery disease) 12/17/2007    Priority: High  . SINUS BRADYCARDIA 07/20/2010    Priority: Medium  . BPH associated with nocturia 06/08/2009    Priority: Medium  . DIZZINESS 12/15/2008    Priority: Medium  . Hyperlipidemia 04/02/2007    Priority: Medium  . Essential hypertension 04/02/2007    Priority: Medium  . Nephrolithiasis 05/07/2014    Priority: Low  . Gout attack 12/14/2012    Priority: Low  . Hematuria 03/28/2012    Priority: Low  . RBBB 12/28/2008    Priority: Low  . GERD 12/15/2008    Priority: Low  . LOSS, CONDUCTIVE HEARING, COMBINED TYPE 04/16/2007    Priority: Low  . Allergic rhinitis 04/02/2007    Priority: Low  . OA (osteoarthritis) of knee 04/02/2007    Priority: Low  . Nonallopathic lesion of sacral region 12/17/2019  . Nonallopathic lesion of lumbosacral region 12/17/2019  . Spasm of left piriformis muscle 06/27/2019  . Degenerative disc disease, cervical 05/28/2019  . Nonallopathic lesion of thoracic region 05/28/2019  . Thrombocytopenia ([place]) 12/04/2016  . Abnormal nuclear stress test 10/26/2016  . Preoperative cardiovascular examination  10/26/2016  . CAD S/P percutaneous coronary angioplasty 10/26/2016  . Diverticulitis of colon 11/22/2015    Medications- reviewed and updated Current Outpatient Medications  Medication Sig Dispense Refill  . acetaminophen (TYLENOL) 650 MG CR tablet Take 1,300 mg by mouth every 8 (eight) hours as needed for pain.    Marland Kitchen aspirin EC 81 MG tablet Take 81 mg by mouth daily.    Marland Kitchen CREAM BASE EX Apply 1 application topically at bedtime. Diabetics' Dry Skin Relief (applied to feet)    . DULoxetine (CYMBALTA) 20 MG capsule TAKE 1 CAPSULE BY MOUTH EVERY DAY 90 capsule 3  . dutasteride (AVODART) 0.5 MG capsule TAKE 1 CAPSULE DAILY 90 capsule 3  . fluticasone (FLONASE) 50 MCG/ACT nasal spray Place 2 sprays into both nostrils daily. 16 g 3  . FREESTYLE INSULINX TEST test strip USE TO CHECK BLOOD SUGAR DAILY AND AS NEEDED DIAG CODE E11.9 100 strip 4  . Lancets (FREESTYLE) lancets E11.9 -CHECK BLOOD SUGAR ONCE DAILY 100 each 4  . losartan (COZAAR) 25 MG tablet Take 1 tablet (25 mg total) by mouth daily. 90 tablet 3  . Naphazoline-Pheniramine (OPCON-A OP) Apply 1 drop to eye daily as needed (allergies).    . nitroGLYCERIN (NITROSTAT) 0.4 MG SL tablet Place 1 tablet (0.4 mg total) under the tongue every 5 (five) minutes as needed for chest pain (for chest pain). Use up to 3 dosages, if no relief call 911. 75 tablet 1  . NON FORMULARY [facility]  Peripheral Neuropathy Cream- Bupivacaine 1%,  Doxepin 3%, Gabapentin 6%, Pentoxifylline 3%, Topiramate 1% Apply 1-2 grams to affected area 3-4 times daily Qty. 120 gm 3 refills    . Omega-3 Fatty Acids (FISH OIL) 1200 MG CAPS Take 2 capsules by mouth 2 (two) times daily.    . rosuvastatin (CRESTOR) 10 MG tablet TAKE 1 TABLET DAILY 90 tablet 3  . tamsulosin (FLOMAX) 0.4 MG CAPS capsule TAKE 1 CAPSULE DAILY 90 capsule 1  . XARELTO 20 MG TABS tablet TAKE 1 TABLET DAILY WITH   SUPPER 90 tablet 3  . traMADol (ULTRAM) 50 MG tablet Take 0.5-1 tablets (25-50 mg total)  by mouth 2 (two) times daily as needed for moderate pain or severe pain. (Patient not taking: Reported on 04/08/2020) 20 tablet 0   No current facility-administered medications for this visit.     Objective:  BP (!) 138/78   Pulse 66   Temp 97.9 F (36.6 C) (Temporal)   Resp 18   Ht 6' (1.829 m)   Wt 193 lb (87.5 kg)   SpO2 95%   BMI 26.18 kg/m  Gen: NAD, resting comfortably CV: RRR no murmurs rubs or gallops Lungs: CTAB no crackles, wheeze, rhonchi Abdomen: soft/nontender/nondistended/normal bowel sounds.  Ext: trace edema Skin: warm, dry    Assessment and Plan   # Diabetes with known CAD/circulatory complications S: Medication: Diet controlled CBGs-blood sugars up slightly since starting Cymbalta up to 120-122 fasting up about 10 points. cymbalta does help his back.  Exercise and diet- reasonably healthy. Exercise limited by his back issues plus covid. Does try to get out and walk driveway but misses the club at [place] and misses that Lab Results  Component Value Date   HGBA1C POC 5.8 (A) 04/08/2020   HGBA1C 5.9 12/30/2019   HGBA1C 6.1 11/19/2018   A/P: well controlled today. Continue without meds   #hyperlipidemia/cad S: Medication:Rosuvastatin 10 mg, aspirin 81mg   No chest pain or shortness of breath in regards to CAD Lab Results  Component Value Date   CHOL 113 12/30/2019   HDL 37.40 (L) 12/30/2019   LDLCALC 43 12/30/2019   LDLDIRECT 54.0 06/05/2017   TRIG 161.0 (H) 12/30/2019   CHOLHDL 3 12/30/2019   A/P: Excellent control with LDL under 70-continue current medications  CAD- asymptomatic- continue current meds  #hypertension S: medication: Losartan 25 mg Home readings #s: 135/78 BP Readings from Last 3 Encounters:  04/08/20 (!) 138/78  03/09/20 110/70  01/29/20 (!) 703/50  A/P: systolic High normal but well controlled-continue current medication  # Atrial flutter- spontaneous converstion in 2014 to sinus- had been ready for  surgery/procedure S: Rate controlled without medication Anticoagulated with Xarelto Patient is  followed by cardiology: Dr. Johnsie Cancel- upcoming appointment  A/P: Stable. Continue current medications.   # back pain- feels good in moring after meds including tylenol arthritis 1300mg  from Dr. Tamala Julian and then hits an afternoon lull and sticks with him throughout the evening where pain worsens. If takes a nap that does help.  -recommended adding an additional tylenol arthritis 650mg  8 hours after initial doe and then perhaps 8 hours later before bed.    Recommended follow up:  6 months follow up Future Appointments  Date Time Provider [place]  04/21/2020  2:15 PM [name], DO [facility] None  04/29/2020  2:00 PM [name], MD [facility]  06/16/2020  2:45 PM [name], DPM [facility]  08/18/2020  1:30 PM Clark-Bruning, Anderson Malta, PA-C [facility]   Lab/Order associations:   ICD-10-CM  1. Type 2 diabetes mellitus with other circulatory complication, without long-term current use of insulin (HCC)  E11.59   2. Coronary artery disease involving native coronary artery of native heart without angina pectoris  I25.10   3. Atrial flutter, unspecified type ([place])  I48.92   4. Hyperlipidemia, unspecified hyperlipidemia type  E78.5   5. Essential hypertension  I10     No orders of the defined types were placed in this encounter.  Return precautions advised.  [name], MD

## 2020-04-15 ENCOUNTER — Telehealth: Payer: Self-pay | Admitting: Cardiovascular Disease

## 2020-04-15 DIAGNOSIS — R040 Epistaxis: Secondary | ICD-10-CM

## 2020-04-15 NOTE — Telephone Encounter (Signed)
Called patient's wife (DPR) about message. Patient has had 3 nose bleeds since yesterday.  1- First one was yesterday, lasting 25 minutes.  2- Second one was earlier today, last 10 to 15 minutes 3- Third time was just before call back, lasting about 5 minutes.  Patient is on xarelto 20 mg by mouth daily and ASA 81 mg daily. Patient wants to know if he needs to hold his xarelto. Consulted Dr. Burt Knack, DOD, he recommends holding xarelto for 48 hours and a referral to ENT.   Called patient's wife back. Informed her of Dr. Antionette Char recommendations. Placed order for referral. Patient's wife verbalized understanding.

## 2020-04-15 NOTE — Telephone Encounter (Signed)
Patient's wife states patient had a nose bleed yesterday, that lasted 25 minutes. She states this morning he had another nose bleed that came on suddenly and lasted for about 10-15 minutes. Per patient's wife, patient denies pain or additional symptoms. She states his BP is 153/71 and his HR is 60. Please return call to discuss.

## 2020-04-21 ENCOUNTER — Ambulatory Visit: Payer: Medicare Other | Admitting: Family Medicine

## 2020-04-21 ENCOUNTER — Telehealth: Payer: Self-pay | Admitting: Cardiovascular Disease

## 2020-04-21 NOTE — Telephone Encounter (Signed)
Pt's wife called and said that for the past week, he has been having continuous nose bleeds. Said he had one last night that lasted for an hour. Please call to discuss

## 2020-04-21 NOTE — Telephone Encounter (Signed)
Called patient's wife back with Dr. Kyla Balzarine recommendations. Patient's wife verbalized understanding.

## 2020-04-21 NOTE — Telephone Encounter (Signed)
HIs primary may have luck getting him into ENT office sooner If it gets bad go to ER and they can pack it and they will arrange more urgent ENT f/u for packing Would not advocate holding xarelto for 2 weeks

## 2020-04-21 NOTE — Telephone Encounter (Signed)
Called patient's wife back about message. Patient continues to have nose bleeds. Patient had nose bleeds last week, and was told to hold xarelto for 48 hours and he was referred to ENT (see phone note 04/15/20) . Patient has ENT appointment on 05/03/20. Patient's wife stated holding the xarelto did not help. She stated patient has had 3 to 4 nose bleeds a day, but the worst was last night and lasted for about an hour. It was so bad that patient called 911. They told patient to spray Afrin in his nose, instead of spraying it on a cotton ball and sticking it up his nose (per ENT office). While EMS was there patient's BP was high, 190/92, 170/88, and 142/90. This morning BP is 137/72 HR 73, and no nose bleeds so far today. Patient is concerned about having another episode of an hour long nose bleed before he can get in to see ENT doctor. Will forward to Dr. Johnsie Cancel for advisement.
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
## 2020-04-27 ENCOUNTER — Other Ambulatory Visit: Payer: Self-pay

## 2020-04-27 ENCOUNTER — Ambulatory Visit (INDEPENDENT_AMBULATORY_CARE_PROVIDER_SITE_OTHER): Payer: Medicare Other | Admitting: Family Medicine

## 2020-04-27 ENCOUNTER — Encounter: Payer: Self-pay | Admitting: Family Medicine

## 2020-04-27 DIAGNOSIS — I2510 Atherosclerotic heart disease of native coronary artery without angina pectoris: Secondary | ICD-10-CM

## 2020-04-27 DIAGNOSIS — M5030 Other cervical disc degeneration, unspecified cervical region: Secondary | ICD-10-CM | POA: Diagnosis not present

## 2020-04-27 DIAGNOSIS — M999 Biomechanical lesion, unspecified: Secondary | ICD-10-CM | POA: Diagnosis not present
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
## 2020-04-27 NOTE — Patient Instructions (Signed)
Good to see you See me again in  

## 2020-04-27 NOTE — Assessment & Plan Note (Addendum)
DDD of cervical. Stable, responding well to cymbalta.  Discussed HEP, RTC in 8 weeks

## 2020-04-29 ENCOUNTER — Other Ambulatory Visit: Payer: Self-pay

## 2020-04-29 ENCOUNTER — Ambulatory Visit (INDEPENDENT_AMBULATORY_CARE_PROVIDER_SITE_OTHER): Payer: Medicare Other | Admitting: Cardiovascular Disease

## 2020-04-29 ENCOUNTER — Encounter: Payer: Self-pay | Admitting: Cardiovascular Disease

## 2020-04-29 VITALS — BP 120/60 | HR 81 | Ht 73.0 in | Wt 193.0 lb

## 2020-04-29 DIAGNOSIS — R040 Epistaxis: Secondary | ICD-10-CM | POA: Diagnosis not present

## 2020-04-29 DIAGNOSIS — I480 Paroxysmal atrial fibrillation: Secondary | ICD-10-CM

## 2020-04-29 DIAGNOSIS — I2510 Atherosclerotic heart disease of native coronary artery without angina pectoris: Secondary | ICD-10-CM

## 2020-04-29 NOTE — Patient Instructions (Signed)
Medication Instructions:  *If you need a refill on your cardiac medications before your next appointment, please call your pharmacy*  Lab Work: If you have labs (blood work) drawn today and your tests are completely normal, you will receive your results only by: Marland Kitchen MyChart Message (if you have MyChart) OR . A paper copy in the mail If you have any lab test that is abnormal or we need to change your treatment, we will call you to review the results.   Testing/Procedures: NONE ORDERED TODAY   Follow-Up: At [facility], you and your health needs are our priority.  As part of our continuing mission to provide you with exceptional heart care, we have created designated Provider Care Teams.  These Care Teams include your primary Cardiologist (physician) and Advanced Practice Providers (APPs -  Physician Assistants and Nurse Practitioners) who all work together to provide you with the care you need, when you need it.  We recommend signing up for the patient portal called "MyChart".  Sign up information is provided on this After Visit Summary.  MyChart is used to connect with patients for Virtual Visits (Telemedicine).  Patients are able to view lab/test results, encounter notes, upcoming appointments, etc.  Non-urgent messages can be sent to your provider as well.   To learn more about what you can do with MyChart, go to [email or web address].    Your next appointment:   6 month(s)  The format for your next appointment:   In Person  Provider:   You may see Jenkins Rouge, MD or one of the following Advanced Practice Providers on your designated Care Team:    Truitt Merle, NP  Cecilie Kicks, NP  Kathyrn Drown, NP

## 2020-05-03 ENCOUNTER — Encounter (INDEPENDENT_AMBULATORY_CARE_PROVIDER_SITE_OTHER): Payer: Self-pay | Admitting: Otolaryngology

## 2020-05-03 ENCOUNTER — Other Ambulatory Visit: Payer: Self-pay

## 2020-05-03 ENCOUNTER — Ambulatory Visit (INDEPENDENT_AMBULATORY_CARE_PROVIDER_SITE_OTHER): Payer: Medicare Other | Admitting: Otolaryngology

## 2020-05-03 VITALS — Temp 97.2°F

## 2020-05-03 DIAGNOSIS — R040 Epistaxis: Secondary | ICD-10-CM

## 2020-05-03 DIAGNOSIS — I2510 Atherosclerotic heart disease of native coronary artery without angina pectoris: Secondary | ICD-10-CM | POA: Diagnosis not present
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
## 2020-05-31 DIAGNOSIS — Z23 Encounter for immunization: Secondary | ICD-10-CM | POA: Diagnosis not present

## 2020-06-07 ENCOUNTER — Other Ambulatory Visit: Payer: Self-pay | Admitting: Family Medicine
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
## 2020-06-09 ENCOUNTER — Other Ambulatory Visit: Payer: Self-pay

## 2020-06-09 ENCOUNTER — Encounter: Payer: Self-pay | Admitting: Family Medicine

## 2020-06-09 ENCOUNTER — Ambulatory Visit (INDEPENDENT_AMBULATORY_CARE_PROVIDER_SITE_OTHER): Payer: Medicare Other | Admitting: Family Medicine

## 2020-06-09 VITALS — BP 118/72 | HR 75 | Ht 73.0 in | Wt 193.0 lb

## 2020-06-09 DIAGNOSIS — I2510 Atherosclerotic heart disease of native coronary artery without angina pectoris: Secondary | ICD-10-CM | POA: Diagnosis not present

## 2020-06-09 DIAGNOSIS — M999 Biomechanical lesion, unspecified: Secondary | ICD-10-CM | POA: Diagnosis not present

## 2020-06-09 DIAGNOSIS — M5030 Other cervical disc degeneration, unspecified cervical region: Secondary | ICD-10-CM | POA: Diagnosis not present

## 2020-06-09 NOTE — Patient Instructions (Addendum)
90 beats per minute for the bike  See me again in 6 weeks

## 2020-06-09 NOTE — Assessment & Plan Note (Signed)
Chronic overall, discussed with patient to continue to work on the posture and ergonomics, we discussed the home exercises, which activities to doing which wants to avoid, increase activity slowly.  Follow-up again in 4 to 8 weeks

## 2020-06-14 ENCOUNTER — Other Ambulatory Visit: Payer: Self-pay | Admitting: Family Medicine

## 2020-06-15 ENCOUNTER — Ambulatory Visit: Payer: Medicare Other | Attending: Internal Medicine

## 2020-06-15 DIAGNOSIS — Z23 Encounter for immunization: Secondary | ICD-10-CM

## 2020-06-15 NOTE — Progress Notes (Signed)
   Covid-19 Vaccination Clinic  Name:  ABIJAH ROUSSEL    MRN: [id number] DOB: [birth year]  06/15/2020  Mr. Bechler was observed post Covid-19 immunization for 15 minutes without incident. He was provided with Vaccine Information Sheet and instruction to access the V-Safe system.   Mr. Cupps was instructed to call 911 with any severe reactions post vaccine: Marland Kitchen Difficulty breathing  . Swelling of face and throat  . A fast heartbeat  . A bad rash all over body  . Dizziness and weakness

## 2020-06-15 NOTE — Progress Notes (Signed)
   Covid-19 Vaccination Clinic  Name:  RENNER SEBALD    MRN: [id number] DOB: March 13, 1928  06/15/2020  Mr. Mccreedy was observed post Covid-19 immunization for 15 minutes without incident. He was provided with Vaccine Information Sheet and instruction to access the V-Safe system.   Mr. Viney was instructed to call 911 with any severe reactions post vaccine: Marland Kitchen Difficulty breathing  . Swelling of face and throat  . A fast heartbeat  . A bad rash all over body  . Dizziness and weakness

## 2020-06-16 ENCOUNTER — Encounter: Payer: Self-pay | Admitting: Podiatry

## 2020-06-16 ENCOUNTER — Other Ambulatory Visit: Payer: Self-pay

## 2020-06-16 ENCOUNTER — Ambulatory Visit (INDEPENDENT_AMBULATORY_CARE_PROVIDER_SITE_OTHER): Payer: Medicare Other | Admitting: Podiatry

## 2020-06-16 DIAGNOSIS — E1151 Type 2 diabetes mellitus with diabetic peripheral angiopathy without gangrene: Secondary | ICD-10-CM | POA: Diagnosis not present

## 2020-06-16 DIAGNOSIS — D689 Coagulation defect, unspecified: Secondary | ICD-10-CM | POA: Diagnosis not present

## 2020-06-16 DIAGNOSIS — M79675 Pain in left toe(s): Secondary | ICD-10-CM | POA: Diagnosis not present

## 2020-06-16 DIAGNOSIS — M79674 Pain in right toe(s): Secondary | ICD-10-CM

## 2020-06-16 DIAGNOSIS — B351 Tinea unguium: Secondary | ICD-10-CM | POA: Diagnosis not present

## 2020-06-16 NOTE — Progress Notes (Signed)
This patient returns to my office for at risk foot care.  This patient requires this care by a professional since this patient will be at risk due to having type 2 diabetes and coagulation defect.  Patient is taking xarelto.  This patient presents to the office with his wife.  This patient is unable to cut nails himself since the patient cannot reach his nails.These nails are painful walking and wearing shoes.  This patient presents for at risk foot care today.  General Appearance  Alert, conversant and in no acute stress.  Vascular  Dorsalis pedis   pulses are palpable  bilaterally. Posterior tibial pulses are absent  Bilaterally. Capillary return is within normal limits  bilaterally. Temperature is within normal limits  bilaterally.  Neurologic  Senn-Weinstein monofilament wire test diminished   bilaterally. Muscle power within normal limits bilaterally.  Nails Thick disfigured discolored nails with subungual debris  from hallux to fifth toes bilaterally. Hammer toes 2-4  B/L.  Orthopedic  No limitations of motion  feet .  No crepitus or effusions noted.  No bony pathology or digital deformities noted.  Skin  normotropic skin with no porokeratosis noted bilaterally.  No signs of infections or ulcers noted.     Onychomycosis  Pain in right toes  Pain in left toes  Consent was obtained for treatment procedures.   Mechanical debridement of nails 1-5  bilaterally performed with a nail nipper.  Filed with dremel without incident.    Return office visit   3 months                   Told patient to return for periodic foot care and evaluation due to potential at risk complications.   [name] DPM
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
## 2020-07-21 ENCOUNTER — Encounter: Payer: Self-pay | Admitting: Family Medicine

## 2020-07-21 ENCOUNTER — Other Ambulatory Visit: Payer: Self-pay

## 2020-07-21 ENCOUNTER — Ambulatory Visit (INDEPENDENT_AMBULATORY_CARE_PROVIDER_SITE_OTHER): Payer: Medicare Other | Admitting: Family Medicine

## 2020-07-21 VITALS — BP 122/72 | HR 60 | Ht 73.0 in | Wt 192.0 lb

## 2020-07-21 DIAGNOSIS — I4892 Unspecified atrial flutter: Secondary | ICD-10-CM

## 2020-07-21 DIAGNOSIS — M5136 Other intervertebral disc degeneration, lumbar region: Secondary | ICD-10-CM | POA: Insufficient documentation

## 2020-07-21 DIAGNOSIS — M51369 Other intervertebral disc degeneration, lumbar region without mention of lumbar back pain or lower extremity pain: Secondary | ICD-10-CM

## 2020-07-21 DIAGNOSIS — M5030 Other cervical disc degeneration, unspecified cervical region: Secondary | ICD-10-CM

## 2020-07-21 DIAGNOSIS — M999 Biomechanical lesion, unspecified: Secondary | ICD-10-CM

## 2020-07-21 DIAGNOSIS — I2510 Atherosclerotic heart disease of native coronary artery without angina pectoris: Secondary | ICD-10-CM | POA: Diagnosis not present

## 2020-07-21 NOTE — Assessment & Plan Note (Signed)
History of atrial flutter.  Patient is in sinus rhythm at the moment.  Discussed with patient about following up with cardiology but at the moment his story seems to be relatively normal.  Discussed signs and symptoms such as chest pain, shortness of breath or dizziness needs to seek medical attention immediately

## 2020-07-21 NOTE — Assessment & Plan Note (Signed)
Known degenerative disc disease.  Patient has done relatively well.  Continues with some mild muscle energy.  Discussed the posture and ergonomics.  Patient's low back still has some very mild tightness.  Continue to monitor.  Follow-up again in 4 to 8 weeks

## 2020-07-21 NOTE — Patient Instructions (Addendum)
Not worried about heart rate-Call if any changes or if other symptoms occur See me in 6-8 weeks

## 2020-07-22 ENCOUNTER — Telehealth: Payer: Self-pay | Admitting: Family Medicine

## 2020-07-22 NOTE — Progress Notes (Signed)
  Chronic Care Management   Outreach Note  07/22/2020 Name: Sean Lane MRN: [id number] DOB: 29-Aug-1928  Referred by: [name], MD Reason for referral : No chief complaint on file.   An unsuccessful telephone outreach was attempted today. The patient was referred to the pharmacist for assistance with care management and care coordination.   Follow Up Plan:   Lauretta Grill Upstream Scheduler

## 2020-07-22 NOTE — Chronic Care Management (AMB) (Signed)
°  Chronic Care Management   Note  07/22/2020 Name: Sean Lane MRN: [id number] DOB: 26-Dec-1927  Sean Lane is a 87 y.o. year old male who is a primary care patient of [name], MD. I reached out to [name] by phone today in response to a referral sent by [name]'s PCP, [name], MD.   [name] was given information about Chronic Care Management services today including:  1. CCM service includes personalized support from designated clinical staff supervised by his physician, including individualized plan of care and coordination with other care providers 2. 24/7 contact phone numbers for assistance for urgent and routine care needs. 3. Service will only be billed when office clinical staff spend 20 minutes or more in a month to coordinate care. 4. Only one practitioner may furnish and bill the service in a calendar month. 5. The patient may stop CCM services at any time (effective at the end of the month) by phone call to the office staff.   Patient agreed to services and verbal consent obtained.   Follow up plan:   Lauretta Grill Upstream Scheduler

## 2020-07-26 ENCOUNTER — Other Ambulatory Visit: Payer: Self-pay

## 2020-07-26 ENCOUNTER — Encounter: Payer: Self-pay | Admitting: Family Medicine

## 2020-07-26 DIAGNOSIS — R1310 Dysphagia, unspecified: Secondary | ICD-10-CM

## 2020-08-18 ENCOUNTER — Ambulatory Visit: Payer: Medicare Other | Admitting: Physician Assistant

## 2020-09-01 ENCOUNTER — Ambulatory Visit (INDEPENDENT_AMBULATORY_CARE_PROVIDER_SITE_OTHER): Payer: Medicare Other | Admitting: Family Medicine

## 2020-09-01 ENCOUNTER — Encounter: Payer: Self-pay | Admitting: Family Medicine

## 2020-09-01 ENCOUNTER — Other Ambulatory Visit: Payer: Self-pay

## 2020-09-01 VITALS — BP 150/70 | HR 77 | Ht 73.0 in | Wt 198.0 lb

## 2020-09-01 DIAGNOSIS — M5030 Other cervical disc degeneration, unspecified cervical region: Secondary | ICD-10-CM

## 2020-09-01 DIAGNOSIS — M5136 Other intervertebral disc degeneration, lumbar region: Secondary | ICD-10-CM

## 2020-09-01 DIAGNOSIS — M51369 Other intervertebral disc degeneration, lumbar region without mention of lumbar back pain or lower extremity pain: Secondary | ICD-10-CM

## 2020-09-01 DIAGNOSIS — M999 Biomechanical lesion, unspecified: Secondary | ICD-10-CM

## 2020-09-01 DIAGNOSIS — I2510 Atherosclerotic heart disease of native coronary artery without angina pectoris: Secondary | ICD-10-CM

## 2020-09-01 NOTE — Assessment & Plan Note (Signed)
Patient is doing well overall.  The Cymbalta has made a big difference in this as well as his stress.  Has noticed some mild elevation in his sugars recently in the 130s that he thinks could be secondary to the medication.  Discussed that this is a potential side effect but still well controlled it seems to me.  Patient will increase activity slowly.  Continue to see me in 6 to 8-week intervals.

## 2020-09-01 NOTE — Patient Instructions (Addendum)
Good to see you Doing great See me again in 6-8 weeks
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
## 2020-09-07 ENCOUNTER — Ambulatory Visit: Payer: Medicare Other

## 2020-09-07 ENCOUNTER — Telehealth: Payer: Self-pay

## 2020-09-07 DIAGNOSIS — E1159 Type 2 diabetes mellitus with other circulatory complications: Secondary | ICD-10-CM

## 2020-09-07 DIAGNOSIS — I10 Essential (primary) hypertension: Secondary | ICD-10-CM

## 2020-09-07 DIAGNOSIS — E785 Hyperlipidemia, unspecified: Secondary | ICD-10-CM

## 2020-09-07 NOTE — Patient Instructions (Addendum)
Sean Lane,  Thank you for taking the time to review your medications with me today.  Please review care plan/goals and call me at [phone] with any questions!   If you have not received the patient assistance application for Xarelto within the next two weeks, please let me know.  Thanks! Johnell Comings, Pharm.D., BCGP Clinical Pharmacist [facility] [phone]  Goals Addressed            This Visit's Progress   . PharmD Care Plan       CARE PLAN ENTRY (see longitudinal plan of care for additional care plan information)  Current Barriers:  . Chronic Disease Management support, education, and care coordination needs related to Hypertension, Hyperlipidemia, and Diabetes   Hypertension BP Readings from Last 3 Encounters:  09/01/20 (!) 150/70  07/21/20 122/72  06/09/20 118/72   . Pharmacist Clinical Goal(s): o Over the next 365 days, patient will work with PharmD and providers to maintain BP goal <140/90 . Current regimen:  o Losartan 25 mg once daily . Interventions: o We discussed home monitoring recommendations. o Reviewed diet/exercise recommendations . Patient self care activities - Over the next 365 days, patient will: o Check BP at least once every 1-2 weeks, document, and provide at future appointments o Ensure daily salt intake < 2300 mg/day  Hyperlipidemia Lab Results  Component Value Date/Time   LDLCALC 43 12/30/2019 02:47 PM   LDLDIRECT 54.0 06/05/2017 12:02 PM   . Pharmacist Clinical Goal(s): o Over the next 365 days, patient will work with PharmD and providers to maintain LDL goal < 70 . Current regimen:  o Rosuvastatin 10 mg once daily . Interventions: o Reviewed side effects - none noted. . Patient self care activities - Over the next 365 days, patient will: o Continue current management  Diabetes Lab Results  Component Value Date/Time   HGBA1C 5.8 (A) 04/08/2020 03:35 PM   HGBA1C 5.9 12/30/2019  02:47 PM   HGBA1C 6.1 11/19/2018 04:10 PM   . Pharmacist Clinical Goal(s): o Over the next 365 days, patient will work with PharmD and providers to maintain A1c goal <7% . Current regimen:  o N/a . Interventions: o We discussed diet and exercise at length - Maintain a healthy weight and exercise regularly, as directed by your health care provider. Eat healthy foods, such as: Lean proteins, complex carbohydrates, fresh fruits and vegetables, low-fat dairy products, healthy fats. . Patient self care activities - Over the next 365 days, patient will: o Check blood sugar as directed, document, and provide at future appointments o Contact provider with any episodes of hypoglycemia  Medication management . Pharmacist Clinical Goal(s): o Over the next 365 days, patient will work with PharmD and providers to maintain optimal medication adherence . Current pharmacy: [facility] . Interventions o Comprehensive medication review performed. o Continue current medication management strategy . Patient self care activities - Over the next 365 days, patient will: o Take medications as prescribed o Report any questions or concerns to PharmD and/or provider(s) Initial goal documentation.      The patient verbalized understanding of instructions provided today and agreed to receive a MyChart copy of patient instruction and/or educational materials. Telephone follow up appointment with pharmacy team member scheduled for: See next appointment with "Care Management Staff" under "What's Next" below.

## 2020-09-07 NOTE — Progress Notes (Signed)
    Chronic Care Management Pharmacy Assistant   Name: Sean Lane  MRN: [id number] DOB: 11/26/1927  Reason for Encounter: Patient Assistant Applications   PCP : Shelva Majestic, MD  Allergies:   Allergies  Allergen Reactions  . Hydromorphone Hcl Other (See Comments)     very agitated. With dilaudid  . Ketoconazole Rash    Rash on feet with use  . Sulfonamide Derivatives Swelling and Rash    Medications: Outpatient Encounter Medications as of 09/07/2020  Medication Sig  . acetaminophen (TYLENOL) 650 MG CR tablet Take 1,300 mg by mouth every 8 (eight) hours as needed for pain.  Marland Kitchen aspirin EC 81 MG tablet Take 81 mg by mouth daily.  Marland Kitchen CREAM BASE EX Apply 1 application topically at bedtime. Diabetics' Dry Skin Relief (applied to feet)  . DULoxetine (CYMBALTA) 20 MG capsule TAKE 1 CAPSULE BY MOUTH EVERY DAY  . dutasteride (AVODART) 0.5 MG capsule TAKE 1 CAPSULE DAILY  . fluticasone (FLONASE) 50 MCG/ACT nasal spray Place 2 sprays into both nostrils daily.  Marland Kitchen FREESTYLE INSULINX TEST test strip USE TO CHECK BLOOD SUGAR DAILY AND AS NEEDED DIAG CODE E11.9  . Lancets (FREESTYLE) lancets E11.9 -CHECK BLOOD SUGAR ONCE DAILY  . losartan (COZAAR) 25 MG tablet Take 1 tablet (25 mg total) by mouth daily.  . Naphazoline-Pheniramine (OPCON-A OP) Apply 1 drop to eye daily as needed (allergies).  . nitroGLYCERIN (NITROSTAT) 0.4 MG SL tablet Place 1 tablet (0.4 mg total) under the tongue every 5 (five) minutes as needed for chest pain (for chest pain). Use up to 3 dosages, if no relief call 911.  . Omega-3 Fatty Acids (FISH OIL) 1200 MG CAPS Take 2 capsules by mouth 2 (two) times daily.  . rosuvastatin (CRESTOR) 10 MG tablet TAKE 1 TABLET DAILY  . tamsulosin (FLOMAX) 0.4 MG CAPS capsule TAKE 1 CAPSULE DAILY  . XARELTO 20 MG TABS tablet TAKE 1 TABLET DAILY WITH   SUPPER   No facility-administered encounter medications on file as of 09/07/2020.    Current Diagnosis: Patient Active  Problem List   Diagnosis Date Noted  . Degenerative disc disease, lumbar 07/21/2020  . Nonallopathic lesion of sacral region 12/17/2019  . Nonallopathic lesion of lumbosacral region 12/17/2019  . Spasm of left piriformis muscle 06/27/2019  . Degenerative disc disease, cervical 05/28/2019  . Nonallopathic lesion of thoracic region 05/28/2019  . Thrombocytopenia (HCC) 12/04/2016  . Abnormal nuclear stress test 10/26/2016  . Preoperative cardiovascular examination 10/26/2016  . CAD S/P percutaneous coronary angioplasty 10/26/2016  . Diverticulitis of colon 11/22/2015  . Nephrolithiasis 05/07/2014  . Gout attack 12/14/2012  . Type 2 diabetes mellitus with CAD(HCC) 12/14/2012  . Hematuria 03/28/2012  . Atrial flutter (HCC) 10/03/2011  . SINUS BRADYCARDIA 07/20/2010  . BPH associated with nocturia 06/08/2009  . RBBB 12/28/2008  . GERD 12/15/2008  . DIZZINESS 12/15/2008  . CAD (coronary artery disease) 12/17/2007  . LOSS, CONDUCTIVE HEARING, COMBINED TYPE 04/16/2007  . Hyperlipidemia 04/02/2007  . Essential hypertension 04/02/2007  . Allergic rhinitis 04/02/2007  . OA (osteoarthritis) of knee 04/02/2007    Spoke with patients wife and informed her I will be mailing out patient assistance application for xarelto . Patients wife would like to mail back to the office. Informed patients wife I will include a stamped envelope and to mail application once its completed.  Aloha Gell ,CMA Clinical Pharmacist Assistant [phone]   Follow-Up:  Pharmacist Review

## 2020-09-07 NOTE — Progress Notes (Signed)
Chronic Care Management Pharmacy Name: Sean Lane     MRN: [id number]     DOB: 09/26/1927  Chief Complaint/ HPI Sean Lane, 86 y.o., male, presents for their initial CCM visit with the clinical pharmacist via telephone due to COVID-19 pandemic. Patient is hard of hearing and accompanied by wife, Sean Lane.  PCP: [name], MD Encounter Diagnoses  Name Primary?  . Essential hypertension Yes  . Hyperlipidemia, unspecified hyperlipidemia type   . Type 2 diabetes mellitus with other circulatory complication, without long-term current use of insulin ([place])     Office Visits:  04/08/2020 (PCP): BGs up slightly to 120-122 since starting cymbalta - notes improvement in back  Consult Visit: 09/01/2020 (Dr Tamala Julian): reports cymbalta has increase BG, highest 130. Reports improvement in mood and pain. 07/21/2020 (Dr Tamala Julian): neck and back pain. Rides stationary bike 2x a day for 15 minutes. Resting pulse around 62 and his heart rate increases to 74. This occurs in the morning. During his evening ride his pulse will jump up to 100. Does not change resistance or speed. Has run this by his cardiologist who they feel has not addressed this issue 04/29/2020 (Dr Johnsie Cancel):  Xarelto since 2017 for his PAF. Salt and weight sensitive - improved with ARB 2018. Epistaxis - improved can hold xarelto if needed due to NSR  Patient Active Problem List   Diagnosis Date Noted  . Degenerative disc disease, lumbar 07/21/2020  . Nonallopathic lesion of sacral region 12/17/2019  . Nonallopathic lesion of lumbosacral region 12/17/2019  . Spasm of left piriformis muscle 06/27/2019  . Degenerative disc disease, cervical 05/28/2019  . Nonallopathic lesion of thoracic region 05/28/2019  . Thrombocytopenia ([place]) 12/04/2016  . Abnormal nuclear stress test 10/26/2016  . Preoperative cardiovascular examination 10/26/2016  . CAD S/P percutaneous coronary angioplasty 10/26/2016  . Diverticulitis of colon 11/22/2015   . Nephrolithiasis 05/07/2014  . Gout attack 12/14/2012  . Type 2 diabetes mellitus with CAD(H[facility]) 12/14/2012  . Hematuria 03/28/2012  . Atrial flutter ([place]) 10/03/2011  . SINUS BRADYCARDIA 07/20/2010  . BPH associated with nocturia 06/08/2009  . RBBB 12/28/2008  . GERD 12/15/2008  . DIZZINESS 12/15/2008  . CAD (coronary artery disease) 12/17/2007  . LOSS, CONDUCTIVE HEARING, COMBINED TYPE 04/16/2007  . Hyperlipidemia 04/02/2007  . Essential hypertension 04/02/2007  . Allergic rhinitis 04/02/2007  . OA (osteoarthritis) of knee 04/02/2007   Past Surgical History:  Procedure Laterality Date  . CATARACT EXTRACTION Bilateral 2005  . FOOT SURGERY Right 1949  . [place]  . LEFT HEART CATH AND CORONARY ANGIOGRAPHY N/A 10/26/2016   Procedure: Left Heart Cath and Coronary Angiography;  Surgeon: Leonie Man, MD;  Location: [facility];  Service: Cardiovascular;  Laterality: N/A;  . PARTIAL KNEE ARTHROPLASTY Right 11/01/2016   Procedure: RIGHT UNICOMPARTMENTAL KNEE;  Surgeon: [name], MD;  Location: [facility];  Service: Orthopedics;  Laterality: Right;  . stent to heart  2009  . TOTAL KNEE ARTHROPLASTY Left   . VASECTOMY  1971   Family History  Problem Relation Age of Onset  . Hypertension Mother   . Arthritis Mother   . Heart disease Father   . Pleurisy Father   . Hearing loss Father   . Diabetes Maternal Grandfather   . Hearing loss Paternal Grandfather   . Diabetes Other        runs in family  . Stroke Other    Allergies  Allergen Reactions  . Hydromorphone Hcl Other (See  Comments)     very agitated. With dilaudid  . Ketoconazole Rash    Rash on feet with use  . Sulfonamide Derivatives Swelling and Rash   Outpatient Encounter Medications as of 09/07/2020  Medication Sig  . acetaminophen (TYLENOL) 650 MG CR tablet Take 1,300 mg by mouth every 8 (eight) hours as needed for pain.  Marland Kitchen aspirin EC 81 MG tablet Take 81 mg by mouth daily.  Marland Kitchen CREAM  BASE EX Apply 1 application topically at bedtime. Diabetics' Dry Skin Relief (applied to feet)  . DULoxetine (CYMBALTA) 20 MG capsule TAKE 1 CAPSULE BY MOUTH EVERY DAY  . dutasteride (AVODART) 0.5 MG capsule TAKE 1 CAPSULE DAILY  . FREESTYLE INSULINX TEST test strip USE TO CHECK BLOOD SUGAR DAILY AND AS NEEDED DIAG CODE E11.9  . losartan (COZAAR) 25 MG tablet Take 1 tablet (25 mg total) by mouth daily.  . Omega-3 Fatty Acids (FISH OIL) 1200 MG CAPS Take 2 capsules by mouth 2 (two) times daily.  . rosuvastatin (CRESTOR) 10 MG tablet TAKE 1 TABLET DAILY  . tamsulosin (FLOMAX) 0.4 MG CAPS capsule TAKE 1 CAPSULE DAILY  . XARELTO 20 MG TABS tablet TAKE 1 TABLET DAILY WITH   SUPPER  . fluticasone (FLONASE) 50 MCG/ACT nasal spray Place 2 sprays into both nostrils daily.  . Lancets (FREESTYLE) lancets E11.9 -CHECK BLOOD SUGAR ONCE DAILY  . Naphazoline-Pheniramine (OPCON-A OP) Apply 1 drop to eye daily as needed (allergies).  . nitroGLYCERIN (NITROSTAT) 0.4 MG SL tablet Place 1 tablet (0.4 mg total) under the tongue every 5 (five) minutes as needed for chest pain (for chest pain). Use up to 3 dosages, if no relief call 911.  . [DISCONTINUED] NON FORMULARY [facility]  Peripheral Neuropathy Cream- Bupivacaine 1%, Doxepin 3%, Gabapentin 6%, Pentoxifylline 3%, Topiramate 1% Apply 1-2 grams to affected area 3-4 times daily Qty. 120 gm 3 refills   No facility-administered encounter medications on file as of 09/07/2020.   Patient Care Team    Relationship Specialty Notifications Start End  [name], MD PCP - General Family Medicine  04/02/14    Comment: Elwyn Lade, MD PCP - Cardiology Cardiology Admissions 02/15/18   [name], MD Consulting Physician Orthopedic Surgery  10/27/16   [name], MD Consulting Physician Ophthalmology  10/21/19   [name], DO Consulting Physician Family Medicine  10/21/19   [name], [facility] Consulting Physician Podiatry  10/21/19    [facility] Consulting Physician Audiology  10/21/19   [name], [facility] Pharmacist Pharmacist  07/22/20    Comment: [phone]   Current Diagnosis/Assessment: Goals Addressed            This Visit's Progress   . PharmD Care Plan       CARE PLAN ENTRY (see longitudinal plan of care for additional care plan information)  Current Barriers:  . Chronic Disease Management support, education, and care coordination needs related to Hypertension, Hyperlipidemia, and Diabetes   Hypertension BP Readings from Last 3 Encounters:  09/01/20 (!) 150/70  07/21/20 122/72  06/09/20 118/72   . Pharmacist Clinical Goal(s): o Over the next 365 days, patient will work with PharmD and providers to maintain BP goal <140/90 . Current regimen:  o Losartan 25 mg once daily . Interventions: o We discussed home monitoring recommendations. o Reviewed diet/exercise recommendations . Patient self care activities - Over the next 365 days, patient will: o Check BP at least once every 1-2 weeks, document, and provide at future appointments o  Ensure daily salt intake < 2300 mg/day  Hyperlipidemia Lab Results  Component Value Date/Time   LDLCALC 43 12/30/2019 02:47 PM   LDLDIRECT 54.0 06/05/2017 12:02 PM   . Pharmacist Clinical Goal(s): o Over the next 365 days, patient will work with PharmD and providers to maintain LDL goal < 70 . Current regimen:  o Rosuvastatin 10 mg once daily . Interventions: o Reviewed side effects - none noted. . Patient self care activities - Over the next 365 days, patient will: o Continue current management  Diabetes Lab Results  Component Value Date/Time   HGBA1C 5.8 (A) 04/08/2020 03:35 PM   HGBA1C 5.9 12/30/2019 02:47 PM   HGBA1C 6.1 11/19/2018 04:10 PM   . Pharmacist Clinical Goal(s): o Over the next 365 days, patient will work with PharmD and providers to maintain A1c goal <7% . Current regimen:  o N/a . Interventions: o We discussed diet and exercise at  length - Maintain a healthy weight and exercise regularly, as directed by your health care provider. Eat healthy foods, such as: Lean proteins, complex carbohydrates, fresh fruits and vegetables, low-fat dairy products, healthy fats. . Patient self care activities - Over the next 365 days, patient will: o Check blood sugar as directed, document, and provide at future appointments o Contact provider with any episodes of hypoglycemia  Medication management . Pharmacist Clinical Goal(s): o Over the next 365 days, patient will work with PharmD and providers to maintain optimal medication adherence . Current pharmacy: [facility] . Interventions o Comprehensive medication review performed. o Continue current medication management strategy . Patient self care activities - Over the next 365 days, patient will: o Take medications as prescribed o Report any questions or concerns to PharmD and/or provider(s) Initial goal documentation.      Hypertension   BP goal <140/90  BP Readings from Last 3 Encounters:  09/01/20 (!) 150/70  07/21/20 122/72  06/09/20 118/72    BMP Latest Ref Rng & Units 12/30/2019 11/19/2018 06/07/2018  Glucose 70 - 99 mg/dL 138(H) 99 111(H)  BUN 6 - 23 mg/dL _0 Creatinine 0.40 - 1.50 mg/dL 0.92 0.87 0.99  BUN/Creat Ratio 10 - 24 - - -  Sodium 135 - 145 mEq/L 141 142 139  Potassium 3.5 - 5.1 mEq/L 4.6 4.2 4.6  Chloride 96 - 112 mEq/L 104 105 103  CO2 19 - 32 mEq/L _1 Calcium 8.4 - 10.5 mg/dL 10.0 9.7 9.7   Previous medications: benicar hct, bisoprolol 5 mg, carvedilol, lisinopril, omesartan, olmesartan-hct. Using exercise; bike 1-2x/day at 15 minutes a session. Reports putting on 10 lbs since pandemic (198 lb). Patient checks BP at home several times per month. Recent home readings: none provided. Denies dizziness. Patient is currently at goal based on avg office BP readings. Currently taking the following medications:  . Losartan 25 mg once daily    We discussed home monitoring recommendations. Reviewed diet/exercise recommendations  Plan  Continue current medications.  Diabetes   A1c goal < 7%  Lab Results  Component Value Date/Time   HGBA1C 5.8 (A) 04/08/2020 03:35 PM   HGBA1C 5.9 12/30/2019 02:47 PM   HGBA1C 6.1 11/19/2018 04:10 PM   HGBA1C 6.3 06/07/2018 10:43 AM   HGBA1C 5.8 12/05/2017 11:14 AM   HGBA1C 5.9 06/05/2017 12:02 PM   MICROALBUR 3.0 (H) 11/22/2015 03:26 PM   MICROALBUR <0.7 11/10/2014 12:25 PM   MICRALBCREAT 2.1 11/22/2015 03:26 PM   MICRALBCREAT 1.6 11/10/2014 12:25 PM   GFR 77.37  12/30/2019 02:47 PM   GFR 82.74 11/19/2018 04:10 PM   GFR 75.83 06/07/2018 10:43 AM   GFR 84.75 12/05/2017 11:14 AM   GFR 91.88 06/05/2017 12:02 PM    Previous medications: metformin IR and XR. Test BG once weekly. Recent FBG readings: 120s, highest 130. Patient is currently at goal on: . N/a - no current medications  We discussed diet and exercise at length - Maintain a healthy weight and exercise regularly, as directed by your health care provider. Eat healthy foods, such as: Lean proteins, complex carbohydrates, fresh fruits and vegetables, low-fat dairy products, healthy fats.  Plan  Continue current management.  Hyperlipidemia   LDL goal < 70  Lipid Panel     Component Value Date/Time   CHOL 113 12/30/2019 1447   TRIG 161.0 (H) 12/30/2019 1447   HDL 37.40 (L) 12/30/2019 1447   LDLCALC 43 12/30/2019 1447   LDLDIRECT 54.0 06/05/2017 1202    Hepatic Function Latest Ref Rng & Units 12/30/2019 11/19/2018 06/07/2018  Total Protein 6.0 - 8.3 g/dL 6.5 6.3 6.4  Albumin 3.5 - 5.2 g/dL 4.4 4.4 4.4  AST 0 - 37 U/L _0 ALT 0 - 53 U/L _1 Alk Phosphatase 39 - 117 U/L 84 92 81  Total Bilirubin 0.2 - 1.2 mg/dL 0.5 0.5 1.0  Bilirubin, Direct 0.0 - 0.3 mg/dL - - -    The ASCVD Risk score (Alzada., et al., 2013) failed to calculate for the following reasons:   The 2013 ASCVD risk score is only valid for  ages 66 to 70   Previous medications: atorvastatin 10 mg. Denies any side effects. Patient is currently at goal the following medications:  . Rosuvastatin 10 mg once daily   We reviewed side effects - none noted.   Plan  Continue current medications.  AFIB   Pulse Readings from Last 3 Encounters:  09/01/20 77  07/21/20 60  06/09/20 75   Stroke prevention. CHA2DS2/VAS 5.  No issues with nose bleed since previous episode. Denies any abnormal bruising, bleeding from nose or gums or blood in urine or stool. Patient is currently controlled on the following medications:  . Xarelto 20 mg once daily with supper  Reviewed patient assistance for xarelto - agreed to move forward.  Plan  Continue current medications.  Vaccines   Immunization History  Administered Date(s) Administered  . Fluad Quad(high Dose 65+) 05/24/2019  . Influenza Split 05/28/2012  . Influenza Whole 06/08/2009  . Influenza, High Dose Seasonal PF 06/27/2013, 06/06/2016, 06/05/2017, 06/07/2018  . Influenza,inj,Quad PF,6+ Mos 05/07/2014, 05/24/2015  . PFIZER SARS-COV-2 Vaccination 10/04/2019, 10/25/2019, 06/15/2020  . Pneumococcal Conjugate-13 11/03/2013, 05/07/2014  . Pneumococcal Polysaccharide-23 05/24/2015  . Pneumococcal-Unspecified 06/20/1982  . Td 09/11/2002  . Tdap 10/25/2011   [facility] [address]   Plan  Recommended patient receive shingrix vaccine in pharmacy.   ED   Previous medications: viagra (has old rx 100 mg at home - would take 1/8 of tablet, appears pt may still be using).  PDE5i meds have been avoided due to potential interference antiplatelet activity and potential BP drop with concomitant nitroglycerine. This has been advised against by cardiology. Reports that his urologist has changed so has not seen in 1+yr.  Patient and patients wife express understanding of these risks and how it is strongly recommended against using viagra/similar  medications.   We discussed risks of using viagra or similar medications.  Plan  Pt  plans to discuss with PCP.  Medication Management / Care Coordination   [facility]ce[facility] medications [facility] [place] to [address] [place] [address] Phone: [phone] Fax: [phone]   Denies any issues with current medication management.   Plan  Continue current medication management strategy. ___________________________ SDOH (Social Determinants of Health) assessments performed: Yes. Future Appointments  Date Time Provider [place]  09/13/2020 11:45 AM Lavonna Monarch, MD [facility]  09/22/2020  3:15 PM [name], DPM [facility]  10/11/2020  1:20 PM [name], MD [facility]  10/13/2020  2:00 PM [name], DO [facility] None  11/02/2020  2:00 PM [name], MD [facility]  03/09/2021  1:30 PM [facility] CCM PHARMACIST [facility]   Visit follow-up:  . CPA follow-up: xarelto PAP, 3 month BP check.  Marland Kitchen RPH follow-up: 6 month f/u, review pap, BP, HLD.  [name], Pharm.D., BCGP Clinical Pharmacist [facility] [phone]

## 2020-09-13 ENCOUNTER — Ambulatory Visit: Payer: Medicare Other | Admitting: Dermatology

## 2020-09-17 ENCOUNTER — Ambulatory Visit (INDEPENDENT_AMBULATORY_CARE_PROVIDER_SITE_OTHER): Payer: Medicare Other | Admitting: Physician Assistant

## 2020-09-17 ENCOUNTER — Other Ambulatory Visit: Payer: Self-pay

## 2020-09-17 ENCOUNTER — Encounter: Payer: Self-pay | Admitting: Physician Assistant

## 2020-09-17 DIAGNOSIS — Z87898 Personal history of other specified conditions: Secondary | ICD-10-CM

## 2020-09-17 DIAGNOSIS — Z86018 Personal history of other benign neoplasm: Secondary | ICD-10-CM | POA: Diagnosis not present

## 2020-09-17 NOTE — Progress Notes (Signed)
   Follow-Up Visit   Subjective  Sean Lane is a 87 y.o. male who presents for the following: Follow-up (52mo follow up per JCB no concerns).   The following portions of the chart were reviewed this encounter and updated as appropriate:  Tobacco  Allergies  Meds  Problems  Med Hx  Surg Hx  Fam Hx      Objective  Well appearing patient in no apparent distress; mood and affect are within normal limits.  All skin waist up examined.  Objective  Mid Back: Dyspigmented scar. No atypical nevi No signs of non-mole skin cancer.    Assessment & Plan  History of atypical nevus Mid Back  Yearly skin exams    I, Mahamed Zalewski, PA-C, have reviewed all documentation's for this visit.  The documentation on 09/17/20 for the exam, diagnosis, procedures and orders are all accurate and complete.

## 2020-09-22 ENCOUNTER — Encounter: Payer: Self-pay | Admitting: Podiatry

## 2020-09-22 ENCOUNTER — Other Ambulatory Visit: Payer: Self-pay

## 2020-09-22 ENCOUNTER — Ambulatory Visit (INDEPENDENT_AMBULATORY_CARE_PROVIDER_SITE_OTHER): Payer: Medicare Other | Admitting: Podiatry

## 2020-09-22 DIAGNOSIS — E1151 Type 2 diabetes mellitus with diabetic peripheral angiopathy without gangrene: Secondary | ICD-10-CM

## 2020-09-22 DIAGNOSIS — M79675 Pain in left toe(s): Secondary | ICD-10-CM

## 2020-09-22 DIAGNOSIS — D689 Coagulation defect, unspecified: Secondary | ICD-10-CM | POA: Diagnosis not present

## 2020-09-22 DIAGNOSIS — M79674 Pain in right toe(s): Secondary | ICD-10-CM

## 2020-09-22 DIAGNOSIS — B351 Tinea unguium: Secondary | ICD-10-CM

## 2020-09-22 NOTE — Progress Notes (Signed)
This patient returns to my office for at risk foot care.  This patient requires this care by a professional since this patient will be at risk due to having type 2 diabetes and coagulation defect.  Patient is taking xarelto.  This patient presents to the office with his wife.  This patient is unable to cut nails himself since the patient cannot reach his nails.These nails are painful walking and wearing shoes.  This patient presents for at risk foot care today.  General Appearance  Alert, conversant and in no acute stress.  Vascular  Dorsalis pedis   pulses are palpable  bilaterally. Posterior tibial pulses are absent  Bilaterally. Capillary return is within normal limits  bilaterally. Temperature is within normal limits  bilaterally.  Neurologic  Senn-Weinstein monofilament wire test diminished   bilaterally. Muscle power within normal limits bilaterally.  Nails Thick disfigured discolored nails with subungual debris  from hallux to fifth toes bilaterally. Hammer toes 2-4  B/L.  Orthopedic  No limitations of motion  feet .  No crepitus or effusions noted.  No bony pathology or digital deformities noted.  Skin  normotropic skin with no porokeratosis noted bilaterally.  No signs of infections or ulcers noted.     Onychomycosis  Pain in right toes  Pain in left toes  Consent was obtained for treatment procedures.   Mechanical debridement of nails 1-5  bilaterally performed with a nail nipper.  Filed with dremel without incident.    Return office visit   10 weeks                 Told patient to return for periodic foot care and evaluation due to potential at risk complications.   Crislyn Willbanks DPM  

## 2020-10-11 ENCOUNTER — Other Ambulatory Visit: Payer: Self-pay

## 2020-10-11 ENCOUNTER — Ambulatory Visit (INDEPENDENT_AMBULATORY_CARE_PROVIDER_SITE_OTHER): Payer: Medicare Other | Admitting: Family Medicine

## 2020-10-11 ENCOUNTER — Encounter: Payer: Self-pay | Admitting: Family Medicine

## 2020-10-11 VITALS — BP 144/70 | HR 79 | Temp 97.6°F | Ht 73.0 in | Wt 186.4 lb

## 2020-10-11 DIAGNOSIS — E785 Hyperlipidemia, unspecified: Secondary | ICD-10-CM | POA: Diagnosis not present

## 2020-10-11 DIAGNOSIS — I4892 Unspecified atrial flutter: Secondary | ICD-10-CM | POA: Diagnosis not present

## 2020-10-11 DIAGNOSIS — E1159 Type 2 diabetes mellitus with other circulatory complications: Secondary | ICD-10-CM | POA: Diagnosis not present

## 2020-10-11 DIAGNOSIS — I10 Essential (primary) hypertension: Secondary | ICD-10-CM

## 2020-10-11 DIAGNOSIS — I2510 Atherosclerotic heart disease of native coronary artery without angina pectoris: Secondary | ICD-10-CM

## 2020-10-11 NOTE — Progress Notes (Addendum)
Phone [phone] In person visit   Subjective:   Sean Lane is a 87 y.o. year old very pleasant male patient who presents for/with See problem oriented charting Chief Complaint  Patient presents with  . Hypertension  . Pain  . Hyperlipidemia   This visit ccurred during the SARS-CoV-2 public health emergency.  Safety protocols were in place, including screening questions prior to the visit, additional usage of staff PPE, and extensive cleaning of exam room while observing appropriate contact time as indicated for disinfecting solutions.   Past Medical History-  Patient Active Problem List   Diagnosis Date Noted  . Type 2 diabetes mellitus with CAD(HCC) 12/14/2012    Priority: High  . Atrial flutter ([place]) 10/03/2011    Priority: High  . CAD (coronary artery disease) 12/17/2007    Priority: High  . SINUS BRADYCARDIA 07/20/2010    Priority: Medium  . BPH associated with nocturia 06/08/2009    Priority: Medium  . DIZZINESS 12/15/2008    Priority: Medium  . Hyperlipidemia 04/02/2007    Priority: Medium  . Essential hypertension 04/02/2007    Priority: Medium  . Nephrolithiasis 05/07/2014    Priority: Low  . Gout attack 12/14/2012    Priority: Low  . Hematuria 03/28/2012    Priority: Low  . RBBB 12/28/2008    Priority: Low  . GERD 12/15/2008    Priority: Low  . LOSS, CONDUCTIVE HEARING, COMBINED TYPE 04/16/2007    Priority: Low  . Allergic rhinitis 04/02/2007    Priority: Low  . OA (osteoarthritis) of knee 04/02/2007    Priority: Low  . Degenerative disc disease, lumbar 07/21/2020  . Nonallopathic lesion of sacral region 12/17/2019  . Nonallopathic lesion of lumbosacral region 12/17/2019  . Spasm of left piriformis muscle 06/27/2019  . Degenerative disc disease, cervical 05/28/2019  . Nonallopathic lesion of thoracic region 05/28/2019  . Thrombocytopenia ([place]) 12/04/2016  . Abnormal nuclear stress test 10/26/2016  . Preoperative cardiovascular examination  10/26/2016  . CAD S/P percutaneous coronary angioplasty 10/26/2016  . Diverticulitis of colon 11/22/2015    Medications- reviewed and updated Current Outpatient Medications  Medication Sig Dispense Refill  . acetaminophen (TYLENOL) 650 MG CR tablet Take 1,300 mg by mouth every 8 (eight) hours as needed for pain.    Marland Kitchen aspirin EC 81 MG tablet Take 81 mg by mouth daily.    Marland Kitchen CREAM BASE EX Apply 1 application topically at bedtime. Diabetics' Dry Skin Relief (applied to feet)    . DULoxetine (CYMBALTA) 20 MG capsule TAKE 1 CAPSULE BY MOUTH EVERY DAY 90 capsule 3  . dutasteride (AVODART) 0.5 MG capsule TAKE 1 CAPSULE DAILY 90 capsule 3  . FREESTYLE INSULINX TEST test strip USE TO CHECK BLOOD SUGAR DAILY AND AS NEEDED DIAG CODE E11.9 100 strip 4  . Lancets (FREESTYLE) lancets E11.9 -CHECK BLOOD SUGAR ONCE DAILY 100 each 4  . losartan (COZAAR) 25 MG tablet Take 1 tablet (25 mg total) by mouth daily. 90 tablet 3  . Naphazoline-Pheniramine (OPCON-A OP) Apply 1 drop to eye daily as needed (allergies).    . nitroGLYCERIN (NITROSTAT) 0.4 MG SL tablet Place 1 tablet (0.4 mg total) under the tongue every 5 (five) minutes as needed for chest pain (for chest pain). Use up to 3 dosages, if no relief call 911. 75 tablet 1  . Omega-3 Fatty Acids (FISH OIL) 1200 MG CAPS Take 2 capsules by mouth 2 (two) times daily.    . rosuvastatin (CRESTOR) 10 MG tablet TAKE 1 TABLET  DAILY 90 tablet 3  . tamsulosin (FLOMAX) 0.4 MG CAPS capsule TAKE 1 CAPSULE DAILY 90 capsule 1  . XARELTO 20 MG TABS tablet TAKE 1 TABLET DAILY WITH   SUPPER 90 tablet 3   No current facility-administered medications for this visit.     Objective:  BP (!) 144/70   Pulse 79   Temp 97.6 F (36.4 C) (Temporal)   Ht 6\' 1"  (1.854 m)   Wt 186 lb 6.4 oz (84.6 kg)   SpO2 97%   BMI 24.59 kg/m  Gen: NAD, resting comfortably CV: RRR no murmurs rubs or gallops Lungs: CTAB no crackles, wheeze, rhonchi  Ext: no edema Skin: warm, dry Neuro: hard  of hearing    Assessment and Plan   # Diabetes with known CAD/circulatory complications S: Medication:diet controlled  CBGs-  trended higher on cymbalta last viist but CBGs were ok Exercise and diet- used to go to the club- limited by covid. Recumbent bike now at the house doing 15-20 minutes once or twice daily. Most of time HR 60-75 Lab Results  Component Value Date   HGBA1C 5.8 (A) 04/08/2020   HGBA1C 5.9 12/30/2019   HGBA1C 6.1 11/19/2018   A/P: hopefully controlled- update a1c and likely remain off medicine -has lost 9 lbs as sugars were up to 135 and have trended down to 115- also clothes fit better  #CAD- asymptomatic with no cp or sob even with exercise #hyperlipidemia S: Medication: rosuvastatin 10Mg , Omega 3 1200Mg , aspirin 81 mg  Lab Results  Component Value Date   CHOL 113 12/30/2019   HDL 37.40 (L) 12/30/2019   LDLCALC 43 12/30/2019   LDLDIRECT 54.0 06/05/2017   TRIG 161.0 (H) 12/30/2019   CHOLHDL 3 12/30/2019   A/P: CAD asymptomatic- continue current meds. HLD at goal with LDL under 70- continue current rx  # Atrial flutter- 2014 with spontaenous conversion back to sinus rhythm #nosebleeds S: Rate controlled with  No medication with bradycardia history Anticoagulated with xarelto 20 mg. Prefers not to do coumadin A/P:  Stable. Continue current medications.   -discussed if nosebleeds could hold hold xarelto for 48 hours if needed. Avoid steroid nasal sprays-consider humidifier  #hypertension S: medication: losartan 25 mg Home readings #s: does not check regularly - typically well controlled 125-130/60 BP Readings from Last 3 Encounters:  10/11/20 (!) 144/70 3rd reading  09/01/20 (!) 150/70 sports medicine office and not repeated  07/21/20 122/72  A/P: Blood pressure slightly high in office on just losartan 25 mg. Asked him to monitor daily for next week and update me on mychart. Has been controlled in past and do not want to overreact to 1x reading -some  orthostatic symptoms and want to be really careful  #chronic pain from DDD- remains on cymbalta through Dr. Tamala Julian and also receives adjustments- both helpful . Tylenol also helpful. Also has helped his mood.   #old viagra- was using 12.5 mg tablets along (works well for him, struggles without this) with tamsulosin- wanred of drop in BP potential- he wants to trial off flomax in that case I am more comfortable with viagra as long as remains chest pain or shortness of breath free. He knows there eis some risk at his age for heart attack or stroke but for quality of life this is important to him and benefits outweigh potential risks- if he runs out- I am willing to send in sildenafil 20mg  and have him take half tablet.  -knows to never take nitroglycerin within 24 hours-  if has chest pain after viagra should go to ED/911 -being off flomax may also help orthostatic issues  Recommended follow up: Return in about 6 months (around 04/10/2021) for follow up- or sooner if needed. Future Appointments  Date Time Provider [place]  10/13/2020  2:00 PM [name], DO [facility] None  11/02/2020  2:00 PM [name], MD [facility]  12/14/2020  4:15 PM [name], DPM [facility]  03/09/2021  1:30 PM [facility] PHARMACIST [facility]  09/20/2021  2:00 PM [place], Ronalee Red, PA-C [facility]   Lab/Order associations:   ICD-10-CM   1. Essential hypertension  I10 CBC with Differential/Platelet    Comprehensive metabolic panel  2. Type 2 diabetes mellitus with other circulatory complication, without long-term current use of insulin (HCC)  E11.59 Hemoglobin A1c    CBC with Differential/Platelet    Comprehensive metabolic panel  3. Hyperlipidemia, unspecified hyperlipidemia type  E78.5 CBC with Differential/Platelet    Comprehensive metabolic panel  4. Coronary artery disease involving native coronary artery of native heart without angina pectoris  I25.10   5. Atrial  flutter, unspecified type ([place])  I48.92    No orders of the defined types were placed in this encounter.  Return precautions advised.  [name], MD

## 2020-10-11 NOTE — Addendum Note (Signed)
Addended by: [name] on: 10/11/2020 02:35 PM   Modules accepted: Orders

## 2020-10-11 NOTE — Patient Instructions (Addendum)
Please stop by lab before you go If you have mychart- we will send your results within 3 business days of [place] receiving them.  If you do not have mychart- we will call you about results within 5 business days of [place] receiving them.  *please also note that you will see labs on mychart as soon as they post. I will later go in and write notes on them- will say "notes from Dr. Yong Channel"  Blood pressure slightly high in office on just losartan 25 mg. Asked him to monitor daily for next week and update me on mychart. Has been controlled in past and do not want to overreact to 1x reading  Stop flomax. Ok to use 10-12.5mg  of sildenafil/viagra. No history of retention reported  Recommended follow up: Return in about 6 months (around 04/10/2021) for follow up- or sooner if needed.

## 2020-10-12 LAB — HEMOGLOBIN A1C: Hgb A1c MFr Bld: 6.4 % (ref 4.6–6.5)

## 2020-10-12 LAB — CBC WITH DIFFERENTIAL/PLATELET
Basophils Absolute: 0.1 10*3/uL (ref 0.0–0.1)
Basophils Relative: 1 % (ref 0.0–3.0)
Eosinophils Absolute: 0 10*3/uL (ref 0.0–0.7)
Eosinophils Relative: 0.9 % (ref 0.0–5.0)
HCT: 38.9 % — ABNORMAL LOW (ref 39.0–52.0)
Hemoglobin: 12.9 g/dL — ABNORMAL LOW (ref 13.0–17.0)
Lymphocytes Relative: 19 % (ref 12.0–46.0)
Lymphs Abs: 1 10*3/uL (ref 0.7–4.0)
MCHC: 33.2 g/dL (ref 30.0–36.0)
MCV: 85.5 fl (ref 78.0–100.0)
Monocytes Absolute: 0.3 10*3/uL (ref 0.1–1.0)
Monocytes Relative: 6.3 % (ref 3.0–12.0)
Neutro Abs: 4 10*3/uL (ref 1.4–7.7)
Neutrophils Relative %: 72.8 % (ref 43.0–77.0)
Platelets: 194 10*3/uL (ref 150.0–400.0)
RBC: 4.55 Mil/uL (ref 4.22–5.81)
RDW: 17.7 % — ABNORMAL HIGH (ref 11.5–15.5)
WBC: 5.5 10*3/uL (ref 4.0–10.5)

## 2020-10-12 LAB — COMPREHENSIVE METABOLIC PANEL
ALT: 9 U/L (ref 0–53)
AST: 16 U/L (ref 0–37)
Albumin: 4.4 g/dL (ref 3.5–5.2)
Alkaline Phosphatase: 77 U/L (ref 39–117)
BUN: 17 mg/dL (ref 6–23)
CO2: 27 mEq/L (ref 19–32)
Calcium: 10.4 mg/dL (ref 8.4–10.5)
Chloride: 104 mEq/L (ref 96–112)
Creatinine, Ser: 0.96 mg/dL (ref 0.40–1.50)
GFR: 69.69 mL/min (ref >60.00)
Glucose, Bld: 141 mg/dL — ABNORMAL HIGH (ref 70–99)
Potassium: 4.3 mEq/L (ref 3.5–5.1)
Sodium: 137 mEq/L (ref 135–145)
Total Bilirubin: 0.5 mg/dL (ref 0.2–1.2)
Total Protein: 7 g/dL (ref 6.0–8.3)

## 2020-10-13 ENCOUNTER — Ambulatory Visit (INDEPENDENT_AMBULATORY_CARE_PROVIDER_SITE_OTHER): Payer: Medicare Other | Admitting: Family Medicine

## 2020-10-13 ENCOUNTER — Encounter: Payer: Self-pay | Admitting: Family Medicine

## 2020-10-13 ENCOUNTER — Other Ambulatory Visit: Payer: Self-pay

## 2020-10-13 VITALS — BP 126/72 | HR 66 | Ht 73.0 in

## 2020-10-13 DIAGNOSIS — M999 Biomechanical lesion, unspecified: Secondary | ICD-10-CM

## 2020-10-13 DIAGNOSIS — I2510 Atherosclerotic heart disease of native coronary artery without angina pectoris: Secondary | ICD-10-CM | POA: Diagnosis not present

## 2020-10-13 DIAGNOSIS — M5136 Other intervertebral disc degeneration, lumbar region: Secondary | ICD-10-CM | POA: Diagnosis not present

## 2020-10-13 NOTE — Patient Instructions (Signed)
Try to space out to 7-8 weeks Glad you are doing better
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
## 2020-10-13 NOTE — Assessment & Plan Note (Signed)
Patient seems to be doing very well with conservative therapy.  The Cymbalta has helped with the aches and pains and has been can help with his mood significantly.  No side effects with renal dose.  Do have room to increase if necessary.  Patient will continue to stay active.  Encouraged him on his weight loss and diet choices.  Follow-up again in 6 to 8 weeks

## 2020-10-15 ENCOUNTER — Other Ambulatory Visit: Payer: Self-pay

## 2020-10-15 DIAGNOSIS — D649 Anemia, unspecified: Secondary | ICD-10-CM

## 2020-10-18 ENCOUNTER — Encounter: Payer: Self-pay | Admitting: Family Medicine

## 2020-10-20 ENCOUNTER — Ambulatory Visit: Payer: Medicare Other | Admitting: Gastroenterology
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
## 2020-11-02 ENCOUNTER — Encounter: Payer: Self-pay | Admitting: Cardiovascular Disease

## 2020-11-02 ENCOUNTER — Other Ambulatory Visit: Payer: Self-pay

## 2020-11-02 ENCOUNTER — Ambulatory Visit (INDEPENDENT_AMBULATORY_CARE_PROVIDER_SITE_OTHER): Payer: Medicare Other

## 2020-11-02 ENCOUNTER — Ambulatory Visit (INDEPENDENT_AMBULATORY_CARE_PROVIDER_SITE_OTHER): Payer: Medicare Other | Admitting: Cardiovascular Disease

## 2020-11-02 ENCOUNTER — Encounter: Payer: Self-pay | Admitting: *Deleted

## 2020-11-02 VITALS — BP 112/58 | HR 74 | Ht 73.0 in | Wt 181.0 lb

## 2020-11-02 DIAGNOSIS — I480 Paroxysmal atrial fibrillation: Secondary | ICD-10-CM

## 2020-11-02 DIAGNOSIS — I10 Essential (primary) hypertension: Secondary | ICD-10-CM

## 2020-11-02 DIAGNOSIS — I2510 Atherosclerotic heart disease of native coronary artery without angina pectoris: Secondary | ICD-10-CM | POA: Diagnosis not present

## 2020-11-02 NOTE — Patient Instructions (Addendum)
Medication Instructions:  STOP LOSARTAN *If you need a refill on your cardiac medications before your next appointment, please call your pharmacy*   Lab Work: NONE If you have labs (blood work) drawn today and your tests are completely normal, you will receive your results only by: Marland Kitchen MyChart Message (if you have MyChart) OR . A paper copy in the mail If you have any lab test that is abnormal or we need to change your treatment, we will call you to review the results.   Testing/Procedures: [place]n [place]- Long Term Monitor Instructions   Your physician has requested you wear your ZIO patch monitor___14____days.   This is a single patch monitor.  [employer] supplies one patch monitor per enrollment.  Additional stickers are not available.   Please do not apply patch if you will be having a Nuclear Stress Test, Echocardiogram, Cardiac CT, MRI, or Chest Xray during the time frame you would be wearing the monitor. The patch cannot be worn during these tests.  You cannot remove and re-apply the ZIO XT patch monitor.   Your ZIO patch monitor will be sent USPS Priority mail from [facility] directly to your home address. The monitor may also be mailed to a PO BOX if home delivery is not available.   It may take 3-5 days to receive your monitor after you have been enrolled.   Once you have received you monitor, please review enclosed instructions.  Your monitor has already been registered assigning a specific monitor serial # to you.   Applying the monitor   Shave hair from upper left chest.   Hold abrader disc by orange tab.  Rub abrader in 40 strokes over left upper chest as indicated in your monitor instructions.   Clean area with 4 enclosed alcohol pads .  Use all pads to assure are is cleaned thoroughly.  Let dry.   Apply patch as indicated in monitor instructions.  Patch will be place under collarbone on left side of chest with arrow pointing upward.   Rub patch adhesive wings for 2  minutes.Remove white label marked "1".  Remove white label marked "2".  Rub patch adhesive wings for 2 additional minutes.   While looking in a mirror, press and release button in center of patch.  A small green light will flash 3-4 times .  This will be your only indicator the monitor has been turned on.     Do not shower for the first 24 hours.  You may shower after the first 24 hours.   Press button if you feel a symptom. You will hear a small click.  Record Date, Time and Symptom in the Patient Log Book.   When you are ready to remove patch, follow instructions on last 2 pages of Patient Log Book.  Stick patch monitor onto last page of Patient Log Book.   Place Patient Log Book in [place] box.  Use locking tab on box and tape box closed securely.  The [employer] and [employer] has [employer] on it.  Please place in mailbox as soon as possible.  Your physician should have your test results approximately 7 days after the monitor has been mailed back to [facility].   Call [place] at [phone] if you have questions regarding your ZIO XT patch monitor.  Call them immediately if you see an orange light blinking on your monitor.   If your monitor falls off in less than 4 days contact our Monitor department at  [phone].  If your monitor becomes loose or falls off after 4 days call [employer] at [phone] for suggestions on securing your monitor.     Follow-Up: At [facility], you and your health needs are our priority.  As part of our continuing mission to provide you with exceptional heart care, we have created designated Provider Care Teams.  These Care Teams include your primary Cardiologist (physician) and Advanced Practice Providers (APPs -  Physician Assistants and Nurse Practitioners) who all work together to provide you with the care you need, when you need it.  We recommend signing up for the patient portal called "MyChart".  Sign up information is  provided on this After Visit Summary.  MyChart is used to connect with patients for Virtual Visits (Telemedicine).  Patients are able to view lab/test results, encounter notes, upcoming appointments, etc.  Non-urgent messages can be sent to your provider as well.   To learn more about what you can do with MyChart, go to [email or web address].    Your next appointment:   6- 8 WEEKS   The format for your next appointment:   In Person  Provider:   Jenkins Rouge, MD

## 2020-11-02 NOTE — Progress Notes (Signed)
Patient ID: Sean Lane, male   DOB: [birth year], 87 y.o.   MRN: [id number] Patient enrolled for [employer] to ship a 14 day ZIO XT long term holter monitor to his home.

## 2020-11-03 ENCOUNTER — Encounter: Payer: Self-pay | Admitting: Family Medicine

## 2020-11-06 DIAGNOSIS — I480 Paroxysmal atrial fibrillation: Secondary | ICD-10-CM | POA: Diagnosis not present

## 2020-11-09 DIAGNOSIS — Z961 Presence of intraocular lens: Secondary | ICD-10-CM | POA: Diagnosis not present

## 2020-11-09 DIAGNOSIS — E119 Type 2 diabetes mellitus without complications: Secondary | ICD-10-CM | POA: Diagnosis not present

## 2020-11-09 LAB — HM DIABETES EYE EXAM

## 2020-11-11 ENCOUNTER — Encounter: Payer: Self-pay | Admitting: Family Medicine

## 2020-11-11 ENCOUNTER — Telehealth: Payer: Self-pay

## 2020-11-11 NOTE — Chronic Care Management (AMB) (Signed)
Chronic Care Management Pharmacy Assistant   Name: Sean Lane  MRN: [id number] DOB: 1928-05-27  Reason for Encounter: Disease State/ Hypertension Adherence Call  PCP : [name], MD  Allergies:   Allergies  Allergen Reactions   Hydromorphone Hcl Other (See Comments)     very agitated. With dilaudid   Ketoconazole Rash    Rash on feet with use   Sulfonamide Derivatives Swelling and Rash    Medications: Outpatient Encounter Medications as of 11/11/2020  Medication Sig   acetaminophen (TYLENOL) 650 MG CR tablet Take 1,300 mg by mouth every 8 (eight) hours as needed for pain.   aspirin EC 81 MG tablet Take 81 mg by mouth daily.   CREAM BASE EX Apply 1 application topically at bedtime. Diabetics' Dry Skin Relief (applied to feet)   DULoxetine (CYMBALTA) 20 MG capsule TAKE 1 CAPSULE BY MOUTH EVERY DAY   dutasteride (AVODART) 0.5 MG capsule TAKE 1 CAPSULE DAILY   FREESTYLE INSULINX TEST test strip USE TO CHECK BLOOD SUGAR DAILY AND AS NEEDED DIAG CODE E11.9   Lancets (FREESTYLE) lancets E11.9 -CHECK BLOOD SUGAR ONCE DAILY   Naphazoline-Pheniramine (OPCON-A OP) Apply 1 drop to eye daily as needed (allergies).   nitroGLYCERIN (NITROSTAT) 0.4 MG SL tablet Place 1 tablet (0.4 mg total) under the tongue every 5 (five) minutes as needed for chest pain (for chest pain). Use up to 3 dosages, if no relief call 911.   Omega-3 Fatty Acids (FISH OIL) 1200 MG CAPS Take 2 capsules by mouth 2 (two) times daily.   rosuvastatin (CRESTOR) 10 MG tablet TAKE 1 TABLET DAILY   tamsulosin (FLOMAX) 0.4 MG CAPS capsule Take 0.4 mg by mouth.   XARELTO 20 MG TABS tablet TAKE 1 TABLET DAILY WITH   SUPPER   No facility-administered encounter medications on file as of 11/11/2020.    Current Diagnosis: Patient Active Problem List   Diagnosis Date Noted   Degenerative disc disease, lumbar 07/21/2020   Nonallopathic lesion of sacral region 12/17/2019   Nonallopathic lesion of  lumbosacral region 12/17/2019   Spasm of left piriformis muscle 06/27/2019   Degenerative disc disease, cervical 05/28/2019   Nonallopathic lesion of thoracic region 05/28/2019   Thrombocytopenia ([place]) 12/04/2016   Abnormal nuclear stress test 10/26/2016   Preoperative cardiovascular examination 10/26/2016   CAD S/P percutaneous coronary angioplasty 10/26/2016   Diverticulitis of colon 11/22/2015   Nephrolithiasis 05/07/2014   Gout attack 12/14/2012   Type 2 diabetes mellitus with CAD(HCC) 12/14/2012   Hematuria 03/28/2012   Atrial flutter ([place]) 10/03/2011   SINUS BRADYCARDIA 07/20/2010   BPH associated with nocturia 06/08/2009   RBBB 12/28/2008   GERD 12/15/2008   DIZZINESS 12/15/2008   CAD (coronary artery disease) 12/17/2007   LOSS, CONDUCTIVE HEARING, COMBINED TYPE 04/16/2007   Hyperlipidemia 04/02/2007   Essential hypertension 04/02/2007   Allergic rhinitis 04/02/2007   OA (osteoarthritis) of knee 04/02/2007    Reviewed chart for medication changes ahead of disease state call.  10/12/2019 OV PCP Dr Yong Channel; chronic follow up, BP slightly elevated, will monitor, rx for Sildenafil 20 mg may take half a tablet.   10/13/2020 OV (sports medicine) Dr. Tamala Julian; Nonallopathic lesion of lumbosacral region, doing well with conservative therapy, continue Cymbalta.  11/02/2020 OV (cardiology) Dr. Johnsie Cancel; PAF, can hold Xarelto if needed as he is in NSR.  Recent Office Vitals: BP Readings from Last 3 Encounters:  11/02/20 (!) 112/58  10/13/20 126/72  10/11/20 (!) 144/70   Pulse Readings from Last  3 Encounters:  11/02/20 74  10/13/20 66  10/11/20 79    Wt Readings from Last 3 Encounters:  11/02/20 181 lb (82.1 kg)  10/11/20 186 lb 6.4 oz (84.6 kg)  09/01/20 198 lb (89.8 kg)     Kidney Function Lab Results  Component Value Date/Time   CREATININE 0.96 10/11/2020 02:38 PM   CREATININE 0.92 12/30/2019 02:47 PM   GFR 69.69 10/11/2020 02:38 PM   GFRNONAA  84 08/27/2017 02:04 PM   GFRAA 97 08/27/2017 02:04 PM    BMP Latest Ref Rng & Units 10/11/2020 12/30/2019 11/19/2018  Glucose 70 - 99 mg/dL 141(H) 138(H) 99  BUN 6 - 23 mg/dL 17 18 20   Creatinine 0.40 - 1.50 mg/dL 0.96 0.92 0.87  BUN/Creat Ratio 10 - 24 - - -  Sodium 135 - 145 mEq/L 137 141 142  Potassium 3.5 - 5.1 mEq/L 4.3 4.6 4.2  Chloride 96 - 112 mEq/L 104 104 105  CO2 19 - 32 mEq/L 27 29 29   Calcium 8.4 - 10.5 mg/dL 10.4 10.0 9.7     Current antihypertensive regimen:  o Per patient's wife not currently on any antihypertensive medications at this time.   How often are you checking your Blood Pressure? 3-5x per week    Current home BP readings: Systolic 035-465'K does not remember diastolic readings off hand.   What recent interventions/DTPs have been made by any provider to improve Blood Pressure control since last CPP Visit: Patient wife states cardiologist d/c Losartan   Any recent hospitalizations or ED visits since last visit with CPP? No , patient has not had any recent hospitalizations or ED visits.   What diet changes have been made to improve Blood Pressure Control?  o Patient's wife states the patient mostly follows a diabetic diet.   What exercise is being done to improve your Blood Pressure Control?  o Patient's wife states its difficult for the patient to exercise due to arthritis in his spine and hip problems. She states he will ride his exercise bike on his good days for about 15-20 minutes.  Adherence Review: Is the patient currently on ACE/ARB medication? No Does the patient have >5 day gap between last estimated fill dates? No  Future Appointments  Date Time Provider [place]  11/15/2020  8:15 AM [facility] LAB [facility]  12/01/2020  2:15 PM [name], DO [facility] None  12/14/2020  4:15 PM [name], DPM [facility]  01/31/2021  9:15 AM [name], MD [facility]  03/09/2021  1:30 PM [facility] CCM PHARMACIST  [facility] PEC  04/11/2021  1:40 PM [name], MD [facility]EC  09/20/2021  2:00 PM [name], PA-C [facility]     April D Calhoun, [place] Pharmacist Assistant [phone]   Follow-Up:  Pharmacist Review

## 2020-11-15 ENCOUNTER — Other Ambulatory Visit (INDEPENDENT_AMBULATORY_CARE_PROVIDER_SITE_OTHER): Payer: Medicare Other

## 2020-11-15 ENCOUNTER — Other Ambulatory Visit: Payer: Self-pay

## 2020-11-15 DIAGNOSIS — D649 Anemia, unspecified: Secondary | ICD-10-CM | POA: Diagnosis not present

## 2020-11-15 LAB — CBC WITH DIFFERENTIAL/PLATELET
Basophils Absolute: 0 10*3/uL (ref 0.0–0.1)
Basophils Relative: 0.9 % (ref 0.0–3.0)
Eosinophils Absolute: 0.1 10*3/uL (ref 0.0–0.7)
Eosinophils Relative: 1.2 % (ref 0.0–5.0)
HCT: 39.3 % (ref 39.0–52.0)
Hemoglobin: 13.2 g/dL (ref 13.0–17.0)
Lymphocytes Relative: 19.5 % (ref 12.0–46.0)
Lymphs Abs: 1 10*3/uL (ref 0.7–4.0)
MCHC: 33.6 g/dL (ref 30.0–36.0)
MCV: 86.8 fl (ref 78.0–100.0)
Monocytes Absolute: 0.3 10*3/uL (ref 0.1–1.0)
Monocytes Relative: 5.9 % (ref 3.0–12.0)
Neutro Abs: 3.8 10*3/uL (ref 1.4–7.7)
Neutrophils Relative %: 72.5 % (ref 43.0–77.0)
Platelets: 220 10*3/uL (ref 150.0–400.0)
RBC: 4.53 Mil/uL (ref 4.22–5.81)
RDW: 18.1 % — ABNORMAL HIGH (ref 11.5–15.5)
WBC: 5.3 10*3/uL (ref 4.0–10.5)

## 2020-11-16 ENCOUNTER — Telehealth: Payer: Self-pay | Admitting: Family Medicine

## 2020-11-16 NOTE — Telephone Encounter (Signed)
Left message for patient to call back and schedule Medicare Annual Wellness Visit (AWV) either virtually OR in office.   Last AWV 10/21/19; please schedule at anytime with [facility] Health Advisor at [facility].  This should be a 45 minute visit.

## 2020-11-23 DIAGNOSIS — I480 Paroxysmal atrial fibrillation: Secondary | ICD-10-CM | POA: Diagnosis not present

## 2020-11-29 ENCOUNTER — Other Ambulatory Visit: Payer: Self-pay | Admitting: Family Medicine
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
## 2020-12-01 ENCOUNTER — Encounter: Payer: Self-pay | Admitting: Family Medicine

## 2020-12-01 ENCOUNTER — Ambulatory Visit (INDEPENDENT_AMBULATORY_CARE_PROVIDER_SITE_OTHER): Payer: Medicare Other | Admitting: Family Medicine

## 2020-12-01 ENCOUNTER — Other Ambulatory Visit: Payer: Self-pay

## 2020-12-01 VITALS — BP 120/70 | HR 77 | Ht 73.0 in | Wt 178.0 lb

## 2020-12-01 DIAGNOSIS — I2510 Atherosclerotic heart disease of native coronary artery without angina pectoris: Secondary | ICD-10-CM | POA: Diagnosis not present

## 2020-12-01 DIAGNOSIS — M999 Biomechanical lesion, unspecified: Secondary | ICD-10-CM | POA: Diagnosis not present

## 2020-12-01 DIAGNOSIS — M5136 Other intervertebral disc degeneration, lumbar region: Secondary | ICD-10-CM

## 2020-12-01 NOTE — Assessment & Plan Note (Signed)
Significant arthritic changes but is responding fairly well to osteopathic manipulation.  Chronic problem with exacerbation.  Has responded well to the Cymbalta.  Patient continues to be active.  Patient has lost a significant amount of weight on purpose and patient does seem to be more mobile which I think has been helpful for him as well.  Discussed icing regimen and home exercises.  Follow-up with me again 6 weeks

## 2020-12-01 NOTE — Patient Instructions (Addendum)
Good to see you See me again in 6 weeks 
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
## 2020-12-09 ENCOUNTER — Encounter: Payer: Self-pay | Admitting: Family Medicine

## 2020-12-09 ENCOUNTER — Ambulatory Visit (INDEPENDENT_AMBULATORY_CARE_PROVIDER_SITE_OTHER): Payer: Medicare Other | Admitting: Family Medicine

## 2020-12-09 ENCOUNTER — Other Ambulatory Visit: Payer: Self-pay

## 2020-12-09 ENCOUNTER — Ambulatory Visit (INDEPENDENT_AMBULATORY_CARE_PROVIDER_SITE_OTHER): Payer: Medicare Other

## 2020-12-09 ENCOUNTER — Ambulatory Visit: Payer: Self-pay

## 2020-12-09 VITALS — BP 138/68 | HR 90 | Ht 73.0 in | Wt 178.0 lb

## 2020-12-09 DIAGNOSIS — M25512 Pain in left shoulder: Secondary | ICD-10-CM

## 2020-12-09 DIAGNOSIS — M75112 Incomplete rotator cuff tear or rupture of left shoulder, not specified as traumatic: Secondary | ICD-10-CM

## 2020-12-09 DIAGNOSIS — M542 Cervicalgia: Secondary | ICD-10-CM

## 2020-12-09 DIAGNOSIS — I2510 Atherosclerotic heart disease of native coronary artery without angina pectoris: Secondary | ICD-10-CM | POA: Diagnosis not present

## 2020-12-09 DIAGNOSIS — M75102 Unspecified rotator cuff tear or rupture of left shoulder, not specified as traumatic: Secondary | ICD-10-CM | POA: Insufficient documentation

## 2020-12-09 DIAGNOSIS — M50323 Other cervical disc degeneration at C6-C7 level: Secondary | ICD-10-CM | POA: Diagnosis not present

## 2020-12-09 IMAGING — DX DG SHOULDER 2+V*L*
3 series · 3 of 3 positions shown · non-contrast
Comparison: None.

CLINICAL DATA: Left shoulder pain

EXAM:
LEFT SHOULDER - 2+ VIEW

[shoulder ap (1 of 2)]
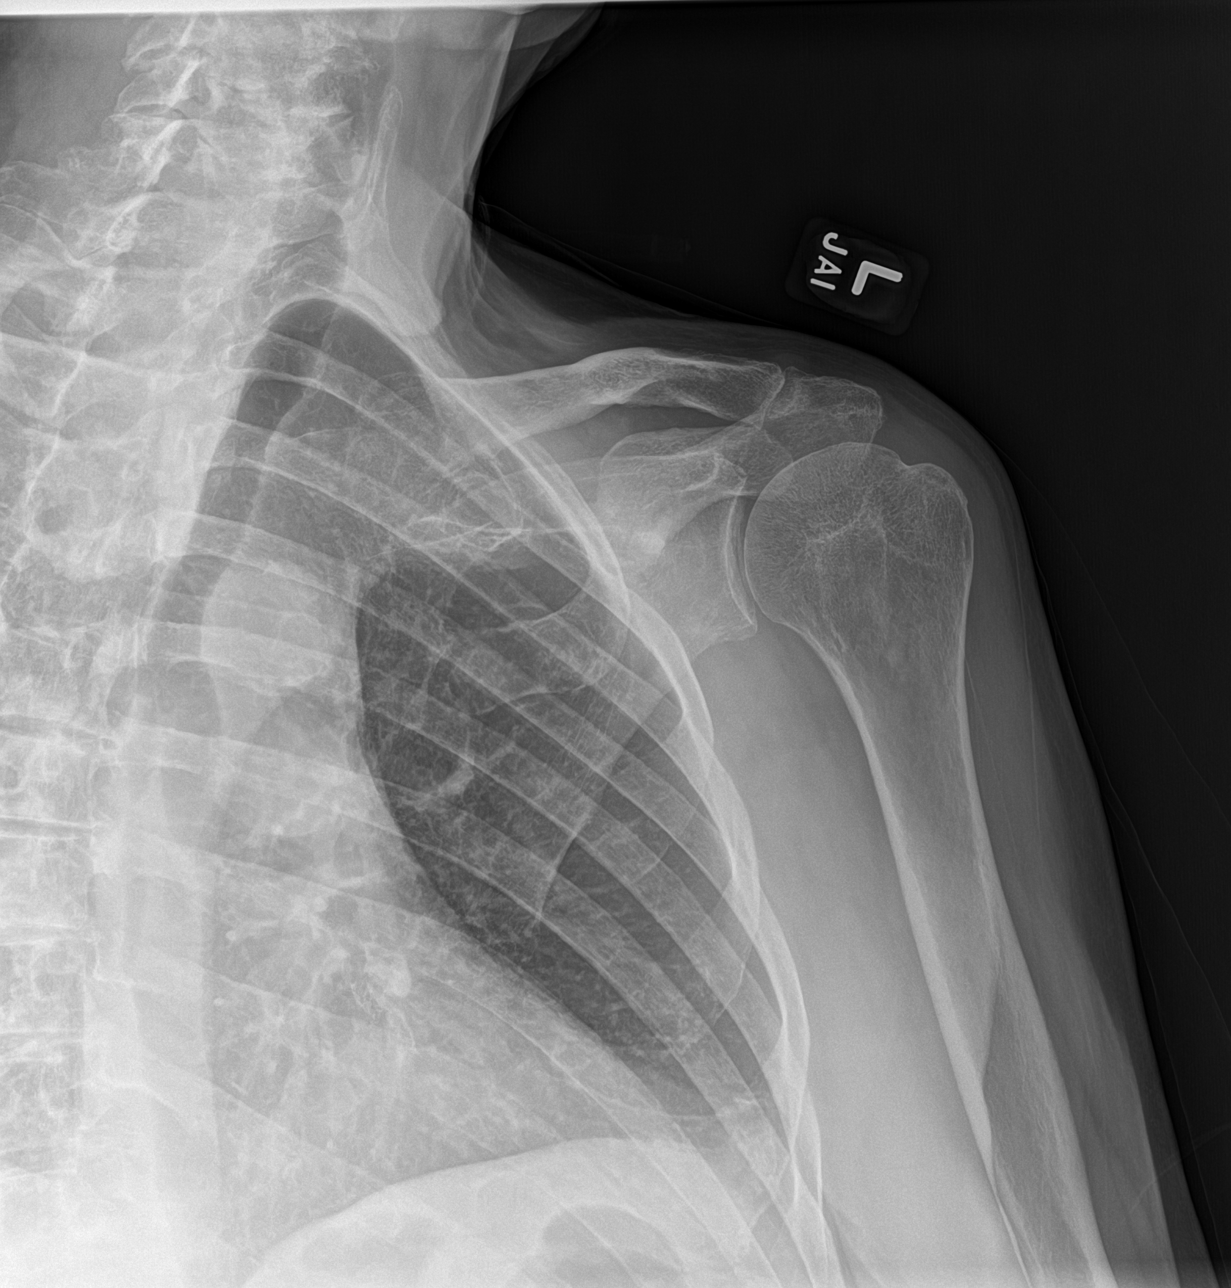

[shoulder ap (2 of 2)]
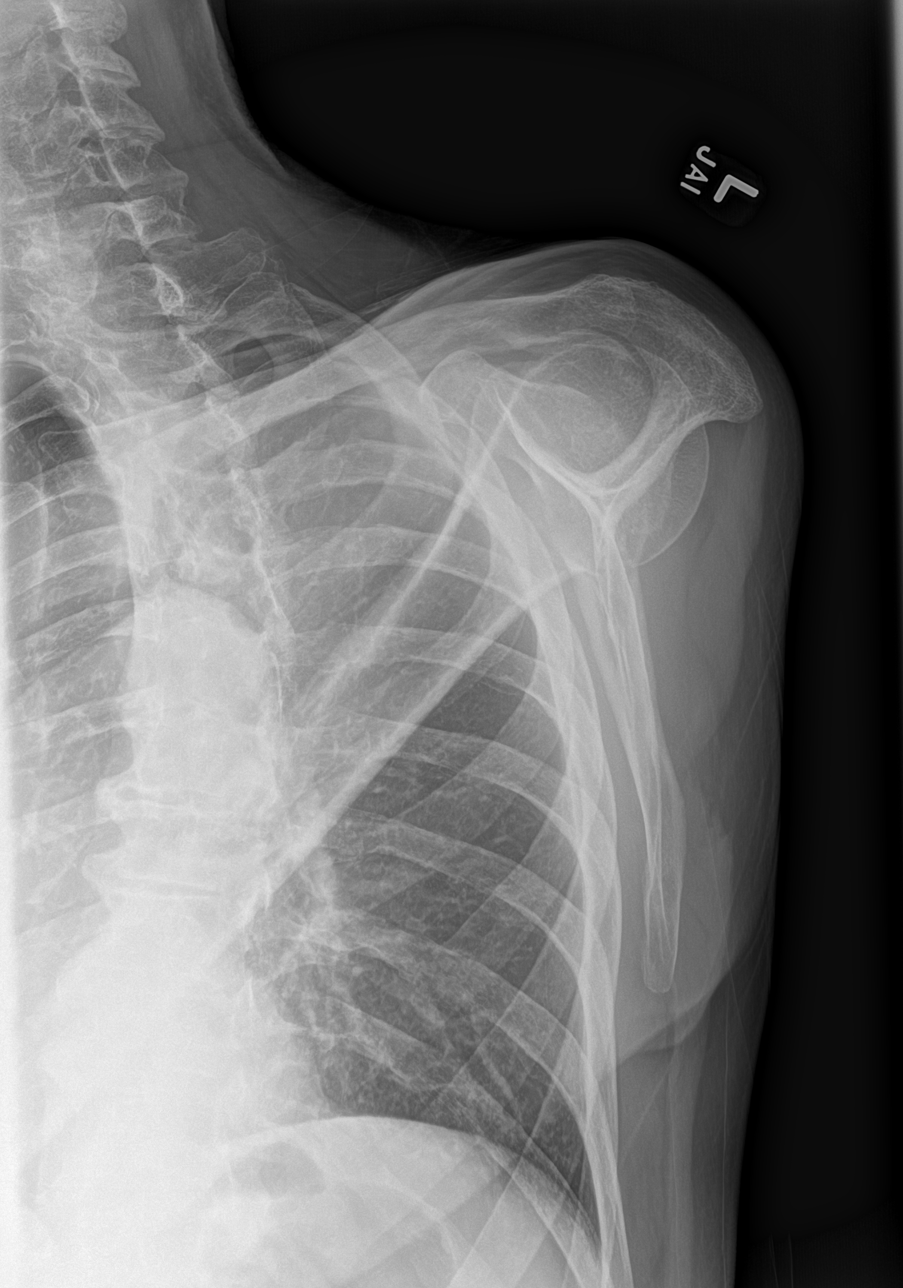

[shoulder axial]
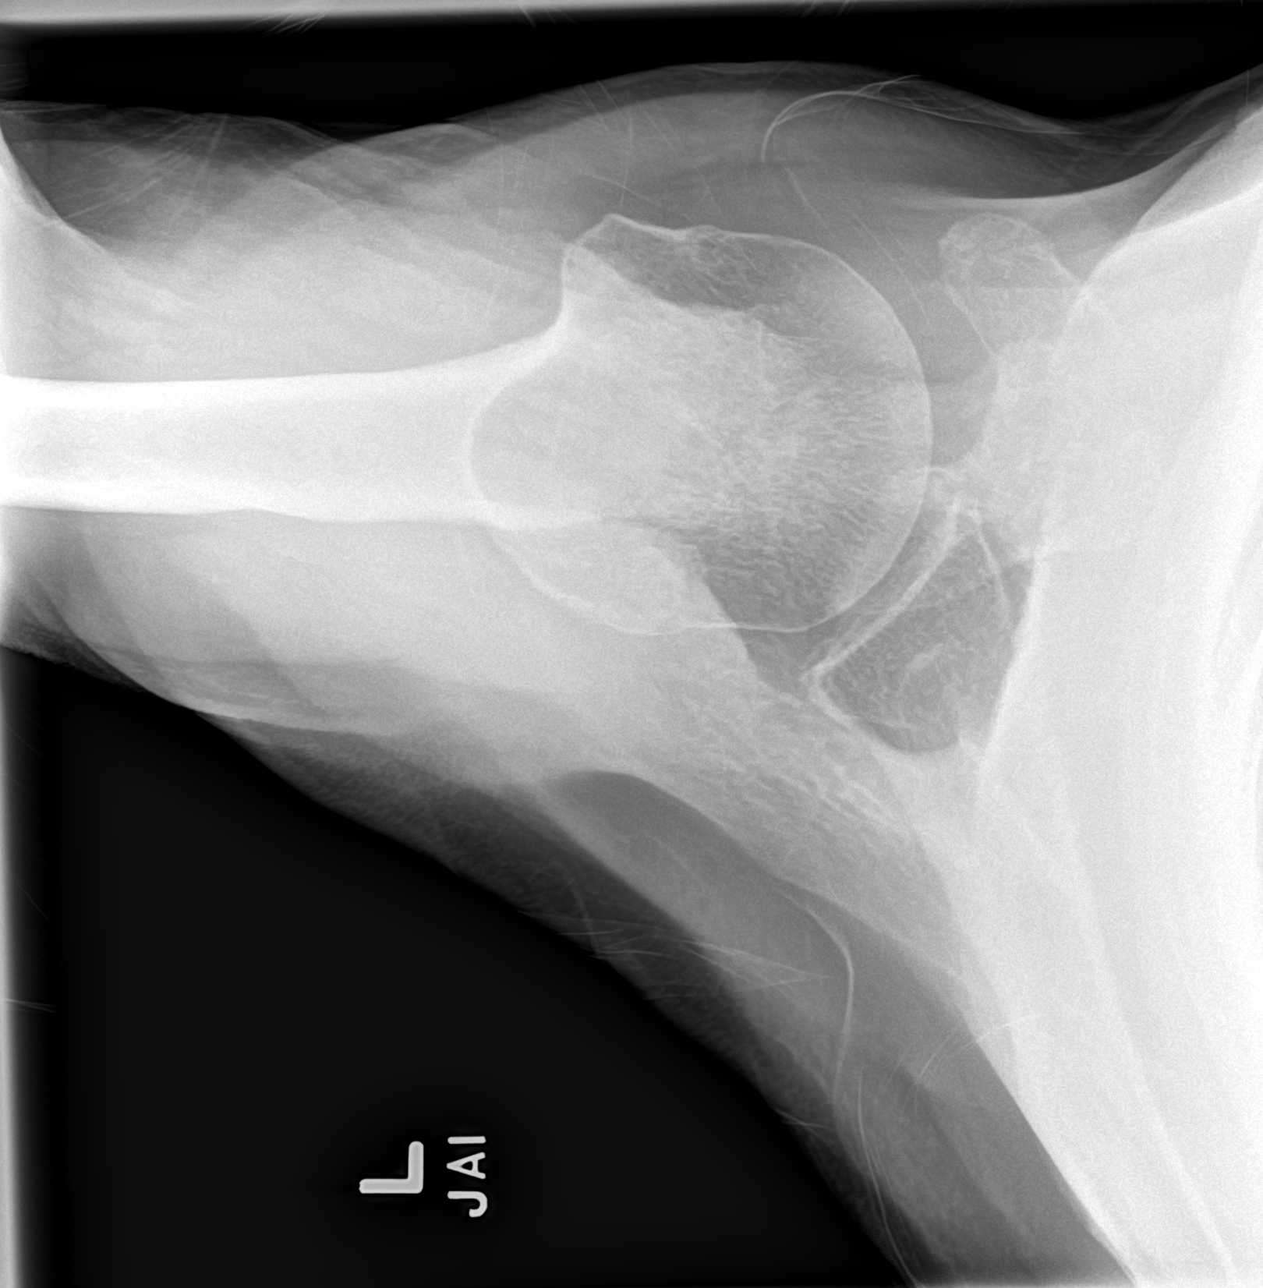

[3 of 3 positions shown; findings below may reference images not displayed]

FINDINGS: There is no evidence of fracture or dislocation. There is no
evidence of arthropathy or other focal bone abnormality. Soft
tissues are unremarkable.
IMPRESSION: Negative.

## 2020-12-09 IMAGING — DX DG CERVICAL SPINE COMPLETE 4+V
5 series · 5 of 5 positions shown · non-contrast
Comparison: [DATE]

CLINICAL DATA: Neck pain

EXAM:
CERVICAL SPINE - COMPLETE 4+ VIEW

[c-spine lat]
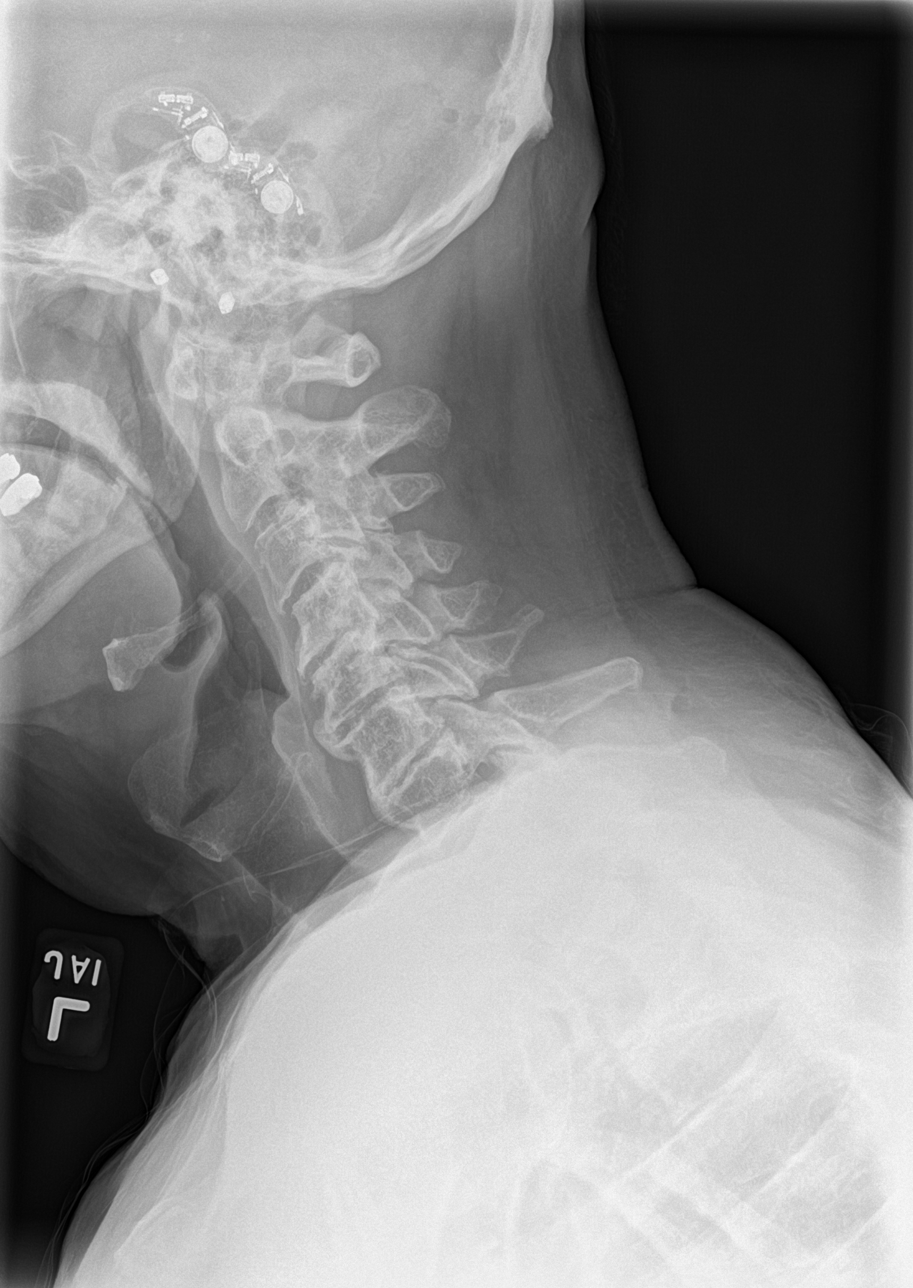

[c-spine obl (1 of 2)]
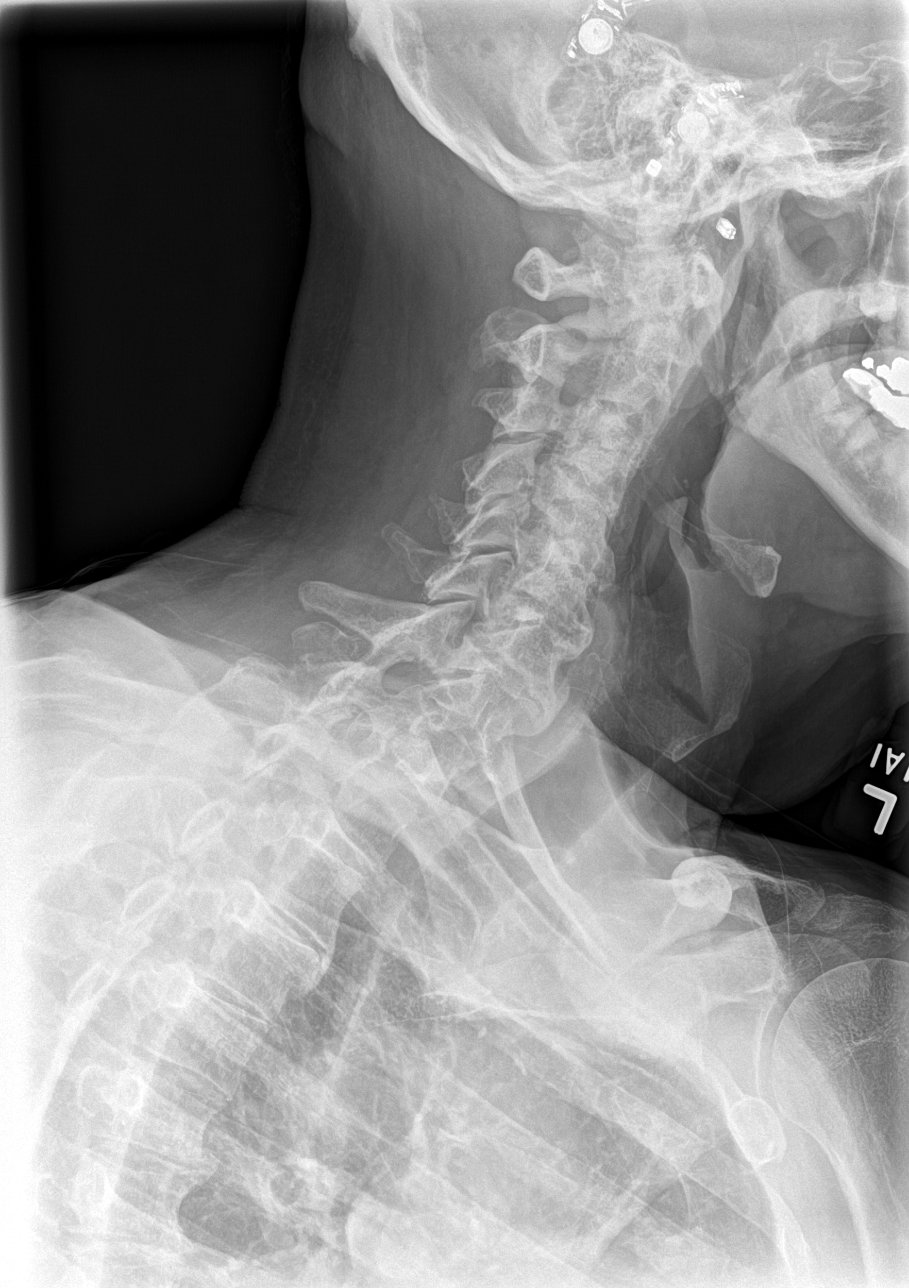

[c-spine obl (2 of 2)]
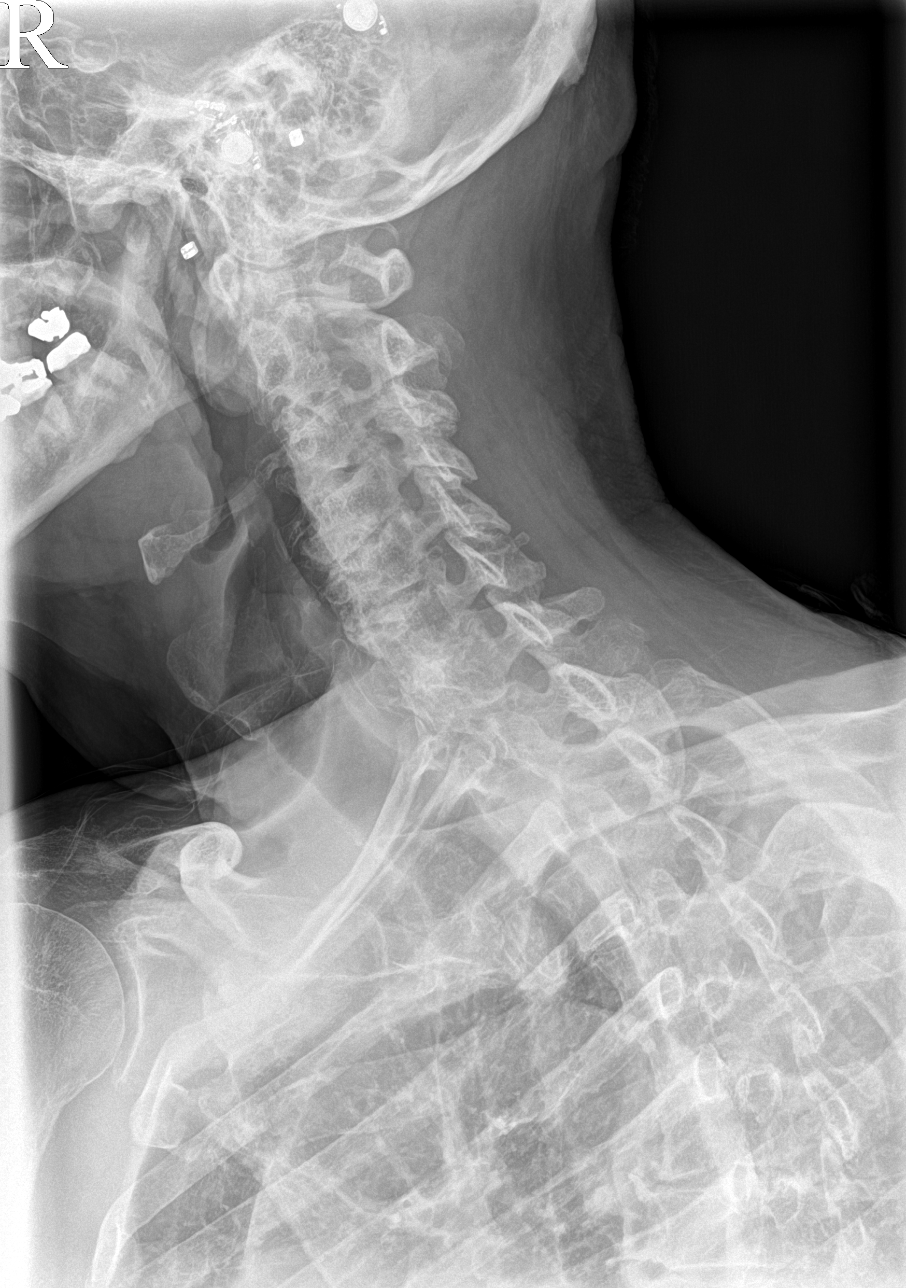

[c-spine ap]
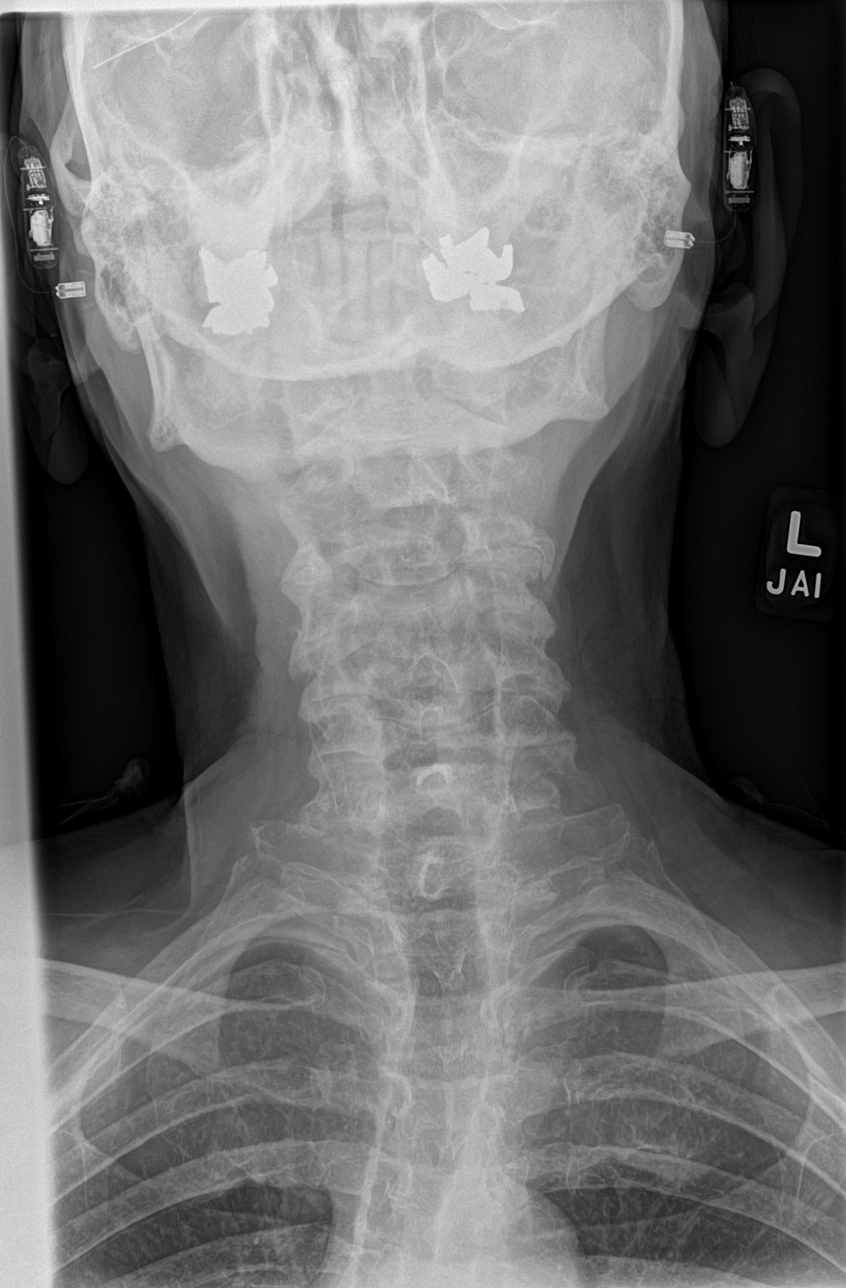

[c-spine open mouth]
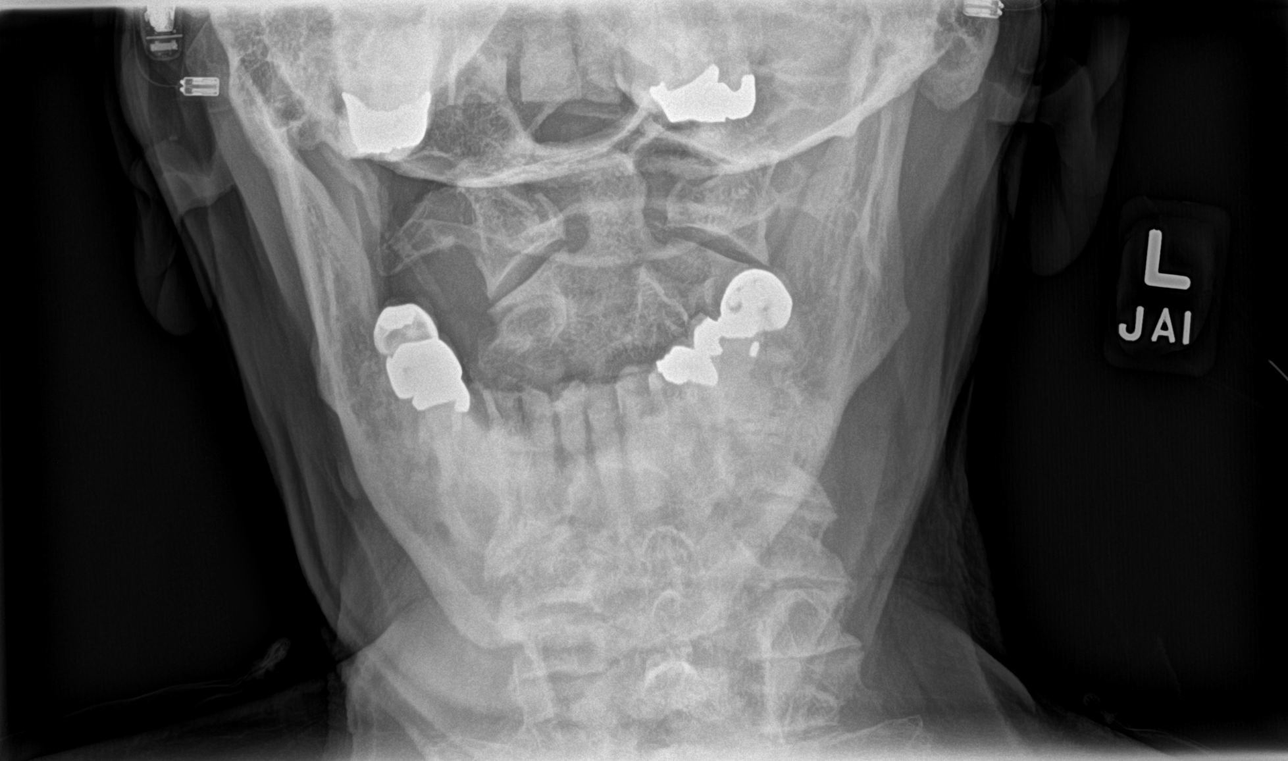

[5 of 5 positions shown; findings below may reference images not displayed]

FINDINGS: Mild straightening of the cervical spine. Minimal anterolisthesis of
C2 upon C3 and C3 upon C4 is unchanged, likely degenerative in
nature. There is no acute fracture or traumatic listhesis of the
cervical spine. There is intervertebral disc space narrowing and
endplate remodeling of C3-C7, most severe at C6-7, in keeping with
changes of severe degenerative disc disease, similar to that seen on
prior examination. The spinal canal is widely patent. The
prevertebral soft tissues are not thickened. The right neural
foramina are not optimally profiled, however, severe neuroforaminal
narrowing at at C3-4, C4-5, C5-6, and C6-7 is suspected secondary to
largely uncovertebral arthrosis. On the left moderate neuroforaminal
narrowing is noted on the C3-4 and C4-5 with milder narrowing at
C5-6 and C6-7 secondary to uncovertebral arthrosis and facet
arthrosis.
IMPRESSION: Stable examination with advanced degenerative disc disease and
degenerative joint disease at C3-C7 resulting in bilateral
neuroforaminal narrowing as described above.

## 2020-12-09 NOTE — Assessment & Plan Note (Signed)
Patient has what appears to be a left rotator cuff tear.  Seems to be maybe an acute on chronic.  Patient has on ultrasound no significant retraction.  Hopeful that patient could make some improvement with physical therapy.  X-rays ordered today of this as well as repeat patient's x-rays of the neck with patient having pain across the.  I think this is more secondary to the pain in the shoulder itself.  Patient wants to hold on any injection at this time but will follow up with me again in 6 weeks

## 2020-12-09 NOTE — Patient Instructions (Addendum)
Xray today PT w Lauren at [facility] up to 4x a day Keep hands in periperhal vision See me on the way out as scheduled

## 2020-12-14 ENCOUNTER — Encounter: Payer: Self-pay | Admitting: Podiatry

## 2020-12-14 ENCOUNTER — Ambulatory Visit (INDEPENDENT_AMBULATORY_CARE_PROVIDER_SITE_OTHER): Payer: Medicare Other | Admitting: Physical Therapy

## 2020-12-14 ENCOUNTER — Ambulatory Visit (INDEPENDENT_AMBULATORY_CARE_PROVIDER_SITE_OTHER): Payer: Medicare Other | Admitting: Podiatry

## 2020-12-14 ENCOUNTER — Other Ambulatory Visit: Payer: Self-pay

## 2020-12-14 ENCOUNTER — Ambulatory Visit: Payer: Medicare Other | Admitting: Physical Therapy

## 2020-12-14 DIAGNOSIS — M6281 Muscle weakness (generalized): Secondary | ICD-10-CM | POA: Diagnosis not present

## 2020-12-14 DIAGNOSIS — E1151 Type 2 diabetes mellitus with diabetic peripheral angiopathy without gangrene: Secondary | ICD-10-CM

## 2020-12-14 DIAGNOSIS — D689 Coagulation defect, unspecified: Secondary | ICD-10-CM

## 2020-12-14 DIAGNOSIS — B351 Tinea unguium: Secondary | ICD-10-CM

## 2020-12-14 DIAGNOSIS — M79675 Pain in left toe(s): Secondary | ICD-10-CM

## 2020-12-14 DIAGNOSIS — M25512 Pain in left shoulder: Secondary | ICD-10-CM | POA: Diagnosis not present

## 2020-12-14 DIAGNOSIS — M79674 Pain in right toe(s): Secondary | ICD-10-CM

## 2020-12-14 DIAGNOSIS — M542 Cervicalgia: Secondary | ICD-10-CM | POA: Diagnosis not present

## 2020-12-14 NOTE — Progress Notes (Signed)
This patient returns to my office for at risk foot care.  This patient requires this care by a professional since this patient will be at risk due to having type 2 diabetes and coagulation defect.  Patient is taking xarelto.  This patient presents to the office with his wife.  This patient is unable to cut nails himself since the patient cannot reach his nails.These nails are painful walking and wearing shoes.  This patient presents for at risk foot care today.  General Appearance  Alert, conversant and in no acute stress.  Vascular  Dorsalis pedis   pulses are palpable  bilaterally. Posterior tibial pulses are absent  Bilaterally. Capillary return is within normal limits  bilaterally. Temperature is within normal limits  bilaterally.  Neurologic  Senn-Weinstein monofilament wire test diminished   bilaterally. Muscle power within normal limits bilaterally.  Nails Thick disfigured discolored nails with subungual debris  from hallux to fifth toes bilaterally. Hammer toes 2-4  B/L.  Orthopedic  No limitations of motion  feet .  No crepitus or effusions noted.  No bony pathology or digital deformities noted.  Skin  normotropic skin with no porokeratosis noted bilaterally.  No signs of infections or ulcers noted.     Onychomycosis  Pain in right toes  Pain in left toes  Consent was obtained for treatment procedures.   Mechanical debridement of nails 1-5  bilaterally performed with a nail nipper.  Filed with dremel without incident.    Return office visit   10 weeks                 Told patient to return for periodic foot care and evaluation due to potential at risk complications.   Ollie Esty DPM  

## 2020-12-14 NOTE — Patient Instructions (Addendum)
Access Code: [email or web address]  URL: [email or web address]  Date: 12/14/2020  Prepared by: Lyndee Hensen     Exercises  Sidelying Shoulder External Rotation - 2 x daily - 1 sets - 10 reps   Supine Shoulder Flexion Extension AAROM with Dowel - 2 x daily - 1 sets - 10 reps   Supine Shoulder Flexion AAROM with Hands Clasped - 2 x daily - 1 sets - 10 reps

## 2020-12-17 ENCOUNTER — Encounter: Payer: Self-pay | Admitting: Physical Therapy
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
## 2020-12-20 ENCOUNTER — Encounter: Payer: Self-pay | Admitting: Physical Therapy

## 2020-12-20 ENCOUNTER — Ambulatory Visit (INDEPENDENT_AMBULATORY_CARE_PROVIDER_SITE_OTHER): Payer: Medicare Other | Admitting: Physical Therapy

## 2020-12-20 ENCOUNTER — Other Ambulatory Visit: Payer: Self-pay

## 2020-12-20 DIAGNOSIS — M6281 Muscle weakness (generalized): Secondary | ICD-10-CM | POA: Diagnosis not present

## 2020-12-20 DIAGNOSIS — M542 Cervicalgia: Secondary | ICD-10-CM | POA: Diagnosis not present

## 2020-12-20 DIAGNOSIS — M25512 Pain in left shoulder: Secondary | ICD-10-CM

## 2020-12-20 NOTE — Patient Instructions (Signed)
Access Code: [email or web address] URL: [email or web address] Date: 12/20/2020 Prepared by: Lyndee Hensen  Exercises Sidelying Shoulder External Rotation - 2 x daily - 2 sets - 10 reps Supine Shoulder Flexion Extension AAROM with Dowel - 2 x daily - 1 sets - 10 reps Supine Shoulder Flexion Extension Full Range AROM - 2 x daily - 1 sets - 10 reps Supine Shoulder Press with Dowel - 2 x daily - 1 sets - 10 reps Shoulder Flexion Wall Slide with Towel - 2 x daily - 1 sets - 10 reps Standing Bicep Curls Supinated with Dumbbells - 2 x daily - 1 sets - 10 reps Seated Cervical Retraction - 2 x daily - 1 sets - 10 reps
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
## 2020-12-22 ENCOUNTER — Other Ambulatory Visit: Payer: Self-pay

## 2020-12-22 ENCOUNTER — Ambulatory Visit (INDEPENDENT_AMBULATORY_CARE_PROVIDER_SITE_OTHER): Payer: Medicare Other | Admitting: Physical Therapy

## 2020-12-22 ENCOUNTER — Encounter: Payer: Self-pay | Admitting: Physical Therapy

## 2020-12-22 DIAGNOSIS — M25512 Pain in left shoulder: Secondary | ICD-10-CM

## 2020-12-22 DIAGNOSIS — M6281 Muscle weakness (generalized): Secondary | ICD-10-CM

## 2020-12-22 DIAGNOSIS — M542 Cervicalgia: Secondary | ICD-10-CM | POA: Diagnosis not present
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
## 2020-12-28 ENCOUNTER — Other Ambulatory Visit: Payer: Self-pay

## 2020-12-28 ENCOUNTER — Ambulatory Visit (INDEPENDENT_AMBULATORY_CARE_PROVIDER_SITE_OTHER): Payer: Medicare Other | Admitting: Physical Therapy

## 2020-12-28 ENCOUNTER — Encounter: Payer: Self-pay | Admitting: Physical Therapy

## 2020-12-28 DIAGNOSIS — M6281 Muscle weakness (generalized): Secondary | ICD-10-CM | POA: Diagnosis not present

## 2020-12-28 DIAGNOSIS — M25512 Pain in left shoulder: Secondary | ICD-10-CM

## 2020-12-28 DIAGNOSIS — M542 Cervicalgia: Secondary | ICD-10-CM | POA: Diagnosis not present
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
## 2020-12-30 ENCOUNTER — Ambulatory Visit (INDEPENDENT_AMBULATORY_CARE_PROVIDER_SITE_OTHER): Payer: Medicare Other | Admitting: Physical Therapy

## 2020-12-30 ENCOUNTER — Encounter: Payer: Self-pay | Admitting: Physical Therapy

## 2020-12-30 ENCOUNTER — Other Ambulatory Visit: Payer: Self-pay

## 2020-12-30 DIAGNOSIS — M6281 Muscle weakness (generalized): Secondary | ICD-10-CM | POA: Diagnosis not present

## 2020-12-30 DIAGNOSIS — M542 Cervicalgia: Secondary | ICD-10-CM | POA: Diagnosis not present

## 2020-12-30 DIAGNOSIS — M25512 Pain in left shoulder: Secondary | ICD-10-CM
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
## 2021-01-03 ENCOUNTER — Other Ambulatory Visit: Payer: Self-pay

## 2021-01-03 ENCOUNTER — Ambulatory Visit (INDEPENDENT_AMBULATORY_CARE_PROVIDER_SITE_OTHER): Payer: Medicare Other | Admitting: Physical Therapy

## 2021-01-03 ENCOUNTER — Encounter: Payer: Self-pay | Admitting: Physical Therapy

## 2021-01-03 DIAGNOSIS — M542 Cervicalgia: Secondary | ICD-10-CM

## 2021-01-03 DIAGNOSIS — M25512 Pain in left shoulder: Secondary | ICD-10-CM | POA: Diagnosis not present

## 2021-01-03 DIAGNOSIS — M6281 Muscle weakness (generalized): Secondary | ICD-10-CM
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
## 2021-01-05 ENCOUNTER — Encounter: Payer: Self-pay | Admitting: Physical Therapy

## 2021-01-05 ENCOUNTER — Ambulatory Visit (INDEPENDENT_AMBULATORY_CARE_PROVIDER_SITE_OTHER): Payer: Medicare Other | Admitting: Physical Therapy

## 2021-01-05 ENCOUNTER — Other Ambulatory Visit: Payer: Self-pay

## 2021-01-05 DIAGNOSIS — M542 Cervicalgia: Secondary | ICD-10-CM | POA: Diagnosis not present

## 2021-01-05 DIAGNOSIS — M6281 Muscle weakness (generalized): Secondary | ICD-10-CM | POA: Diagnosis not present

## 2021-01-05 DIAGNOSIS — M25512 Pain in left shoulder: Secondary | ICD-10-CM | POA: Diagnosis not present
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
## 2021-01-10 ENCOUNTER — Encounter: Payer: Medicare Other | Admitting: Physical Therapy
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
## 2021-01-12 ENCOUNTER — Other Ambulatory Visit: Payer: Self-pay

## 2021-01-12 ENCOUNTER — Encounter: Payer: Self-pay | Admitting: Family Medicine

## 2021-01-12 ENCOUNTER — Ambulatory Visit: Payer: Self-pay

## 2021-01-12 ENCOUNTER — Ambulatory Visit (INDEPENDENT_AMBULATORY_CARE_PROVIDER_SITE_OTHER): Payer: Medicare Other | Admitting: Family Medicine

## 2021-01-12 ENCOUNTER — Encounter: Payer: Medicare Other | Admitting: Physical Therapy

## 2021-01-12 VITALS — BP 144/78 | HR 84 | Ht 73.0 in | Wt 180.0 lb

## 2021-01-12 DIAGNOSIS — I2510 Atherosclerotic heart disease of native coronary artery without angina pectoris: Secondary | ICD-10-CM

## 2021-01-12 DIAGNOSIS — M75112 Incomplete rotator cuff tear or rupture of left shoulder, not specified as traumatic: Secondary | ICD-10-CM | POA: Diagnosis not present

## 2021-01-12 DIAGNOSIS — M25512 Pain in left shoulder: Secondary | ICD-10-CM

## 2021-01-12 DIAGNOSIS — G8929 Other chronic pain: Secondary | ICD-10-CM | POA: Diagnosis not present

## 2021-01-12 NOTE — Patient Instructions (Addendum)
Injected L shoulder If not better in 2 weeks I do want an MRI Send message then Can start exercises in 2-3 weeks  Hold with PT See me again in 6 weeks

## 2021-01-12 NOTE — Assessment & Plan Note (Signed)
I believe the patient did have acute worsening of the tear.  Attempted an injection today to help with some of the hypoechoic changes that was noted.  With patient not responding well to this we will need to consider the possibility of an MRI.  Patient wants to give it 2 weeks and see how patient responds.  Even though patient is 87 years of age I do believe the patient would do significantly better with a rotator cuff repair.  Patient is highly active.  Patient will call again in 2 weeks if no significant improvement after the injection.  We will have her follow-up in 6 weeks no matter what.
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
## 2021-01-26 ENCOUNTER — Encounter: Payer: Self-pay | Admitting: Family Medicine

## 2021-01-27 ENCOUNTER — Other Ambulatory Visit: Payer: Self-pay

## 2021-01-27 DIAGNOSIS — M542 Cervicalgia: Secondary | ICD-10-CM

## 2021-01-31 ENCOUNTER — Ambulatory Visit (INDEPENDENT_AMBULATORY_CARE_PROVIDER_SITE_OTHER): Payer: Medicare Other | Admitting: Cardiovascular Disease

## 2021-01-31 ENCOUNTER — Other Ambulatory Visit: Payer: Self-pay

## 2021-01-31 ENCOUNTER — Encounter: Payer: Self-pay | Admitting: Cardiovascular Disease

## 2021-01-31 VITALS — BP 122/66 | HR 69 | Ht 73.0 in | Wt 179.0 lb

## 2021-01-31 DIAGNOSIS — Z7901 Long term (current) use of anticoagulants: Secondary | ICD-10-CM

## 2021-01-31 DIAGNOSIS — I480 Paroxysmal atrial fibrillation: Secondary | ICD-10-CM

## 2021-01-31 DIAGNOSIS — Z5181 Encounter for therapeutic drug level monitoring: Secondary | ICD-10-CM

## 2021-01-31 DIAGNOSIS — I2510 Atherosclerotic heart disease of native coronary artery without angina pectoris: Secondary | ICD-10-CM

## 2021-01-31 DIAGNOSIS — I10 Essential (primary) hypertension: Secondary | ICD-10-CM

## 2021-01-31 NOTE — Patient Instructions (Signed)

## 2021-02-01 ENCOUNTER — Ambulatory Visit
Admission: RE | Admit: 2021-02-01 | Discharge: 2021-02-01 | Disposition: A | Discharge status: Home / Self Care | Payer: Medicare Other | Source: Ambulatory Visit | Attending: Family Medicine | Admitting: Family Medicine

## 2021-02-01 DIAGNOSIS — M542 Cervicalgia: Secondary | ICD-10-CM

## 2021-02-01 DIAGNOSIS — M4802 Spinal stenosis, cervical region: Secondary | ICD-10-CM | POA: Diagnosis not present

## 2021-02-01 IMAGING — MR MR CERVICAL SPINE W/O CM
4 of 5 series · 28 of 48 positions shown · non-contrast
Comparison: Cervical spine radiographs [DATE]

CLINICAL DATA: Spinal stenosis.  Neck pain and left arm pain

EXAM:
MRI CERVICAL SPINE WITHOUT CONTRAST
TECHNIQUE: Multiplanar, multisequence MR imaging of the cervical spine was
performed. No intravenous contrast was administered.

[Series 5: T2 · sagittal · 3.0mm · 0.55mm/px · 7 of 17 slices shown (1 of 2)]
[im 1/17]
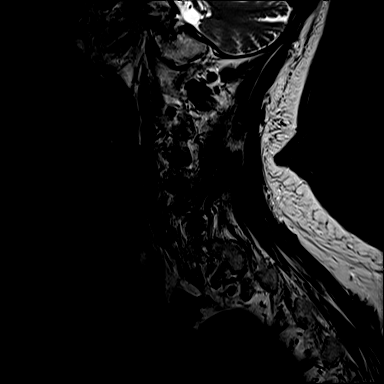
[im 3/17]
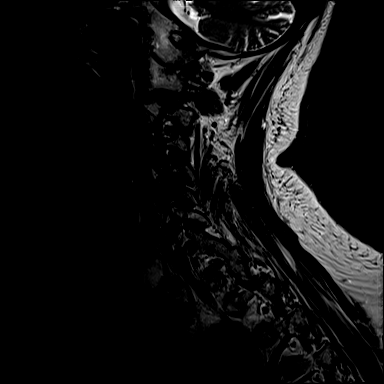
[im 6/17]
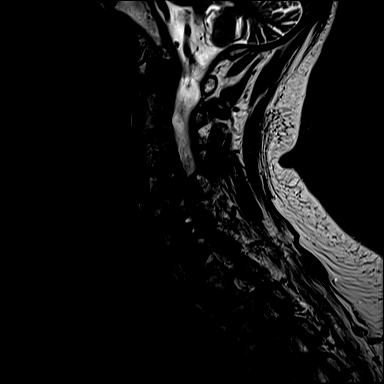
[im 9/17]
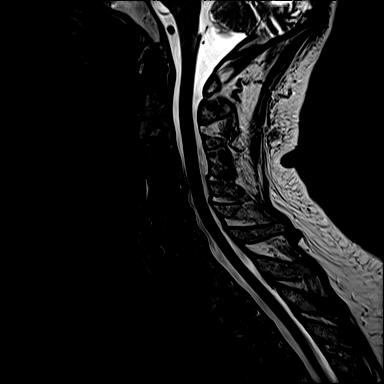
[im 11/17]
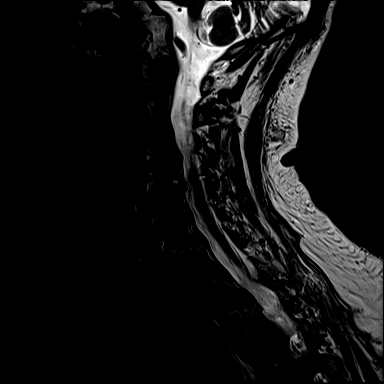
[im 14/17]
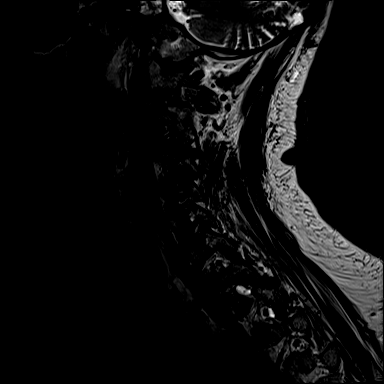
[im 17/17]
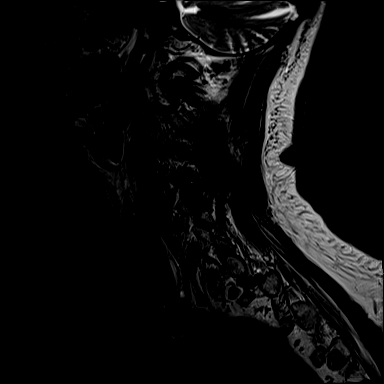

[Series 6: T1 · sagittal · 3.0mm · 0.66mm/px · 7 of 17 slices shown]
[im 1/17]
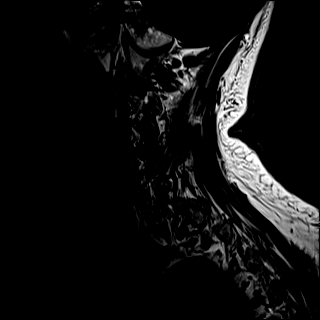
[im 3/17]
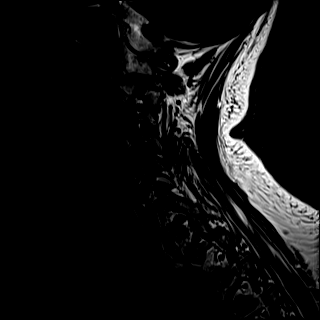
[im 6/17]
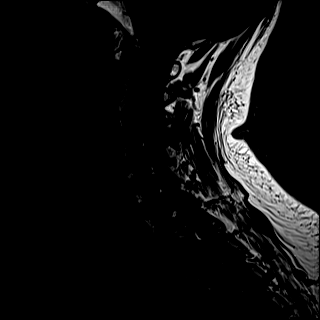
[im 9/17]
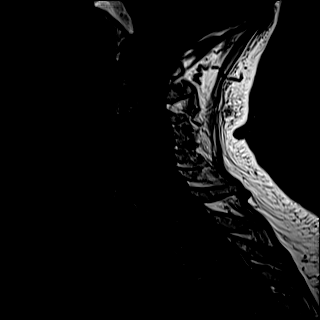
[im 11/17]
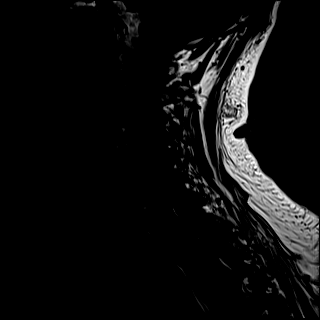
[im 14/17]
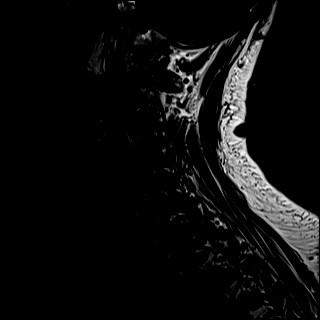
[im 17/17]
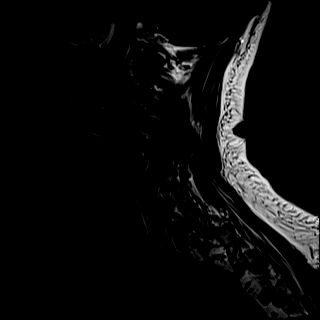

[Series 7: STIR · sagittal · 3.0mm · 0.33mm/px · 5 of 17 slices shown]
[im 1/17]
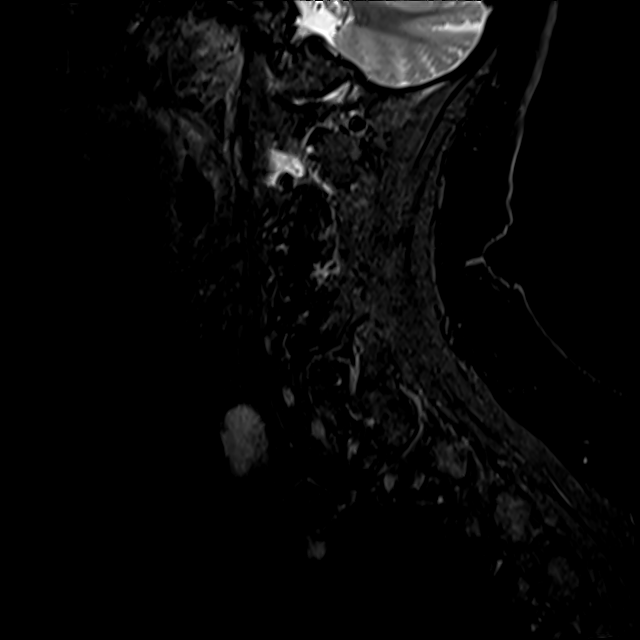
[im 3/17]
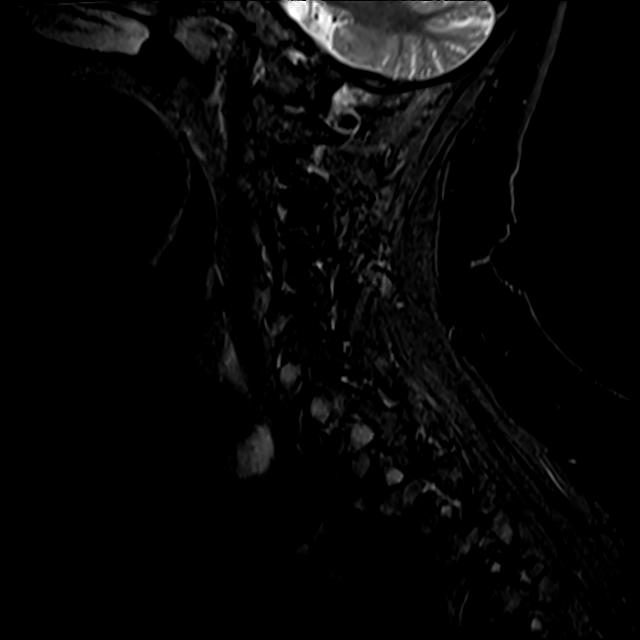
[im 6/17]
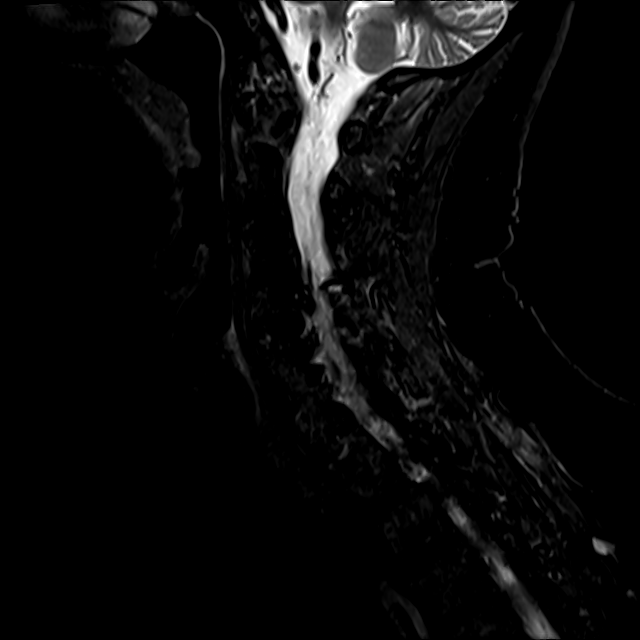
[im 9/17]
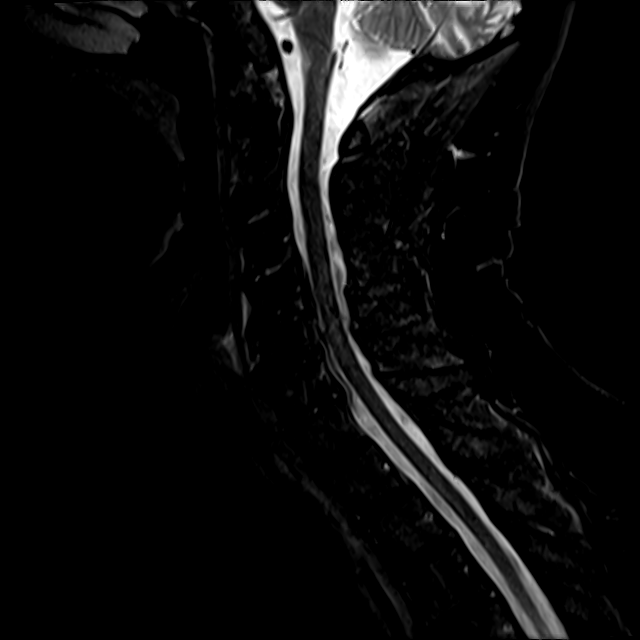
[im 14/17]
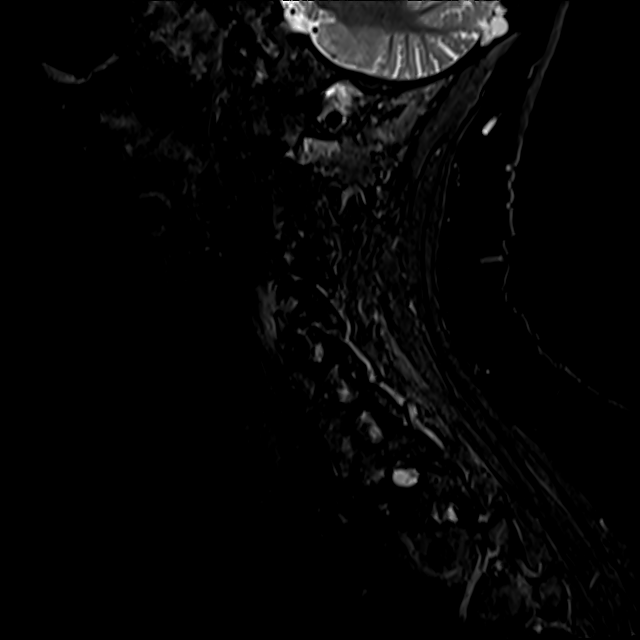

[Series 8: T2 · axial · 3.0mm · 0.50mm/px · z∈[-48,+45]mm · 9 of 30 slices shown (2 of 2)]
[im 1/30]
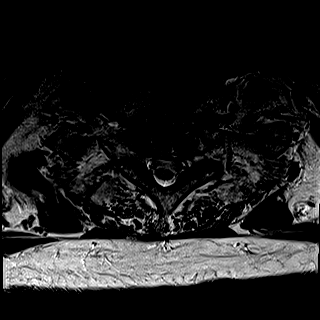
[im 5/30]
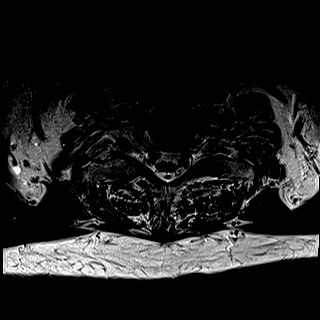
[im 10/30]
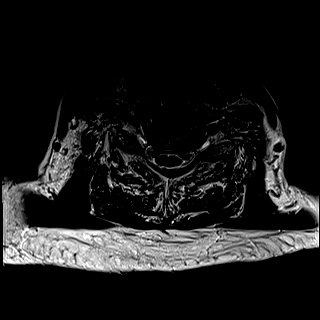
[im 13/30]
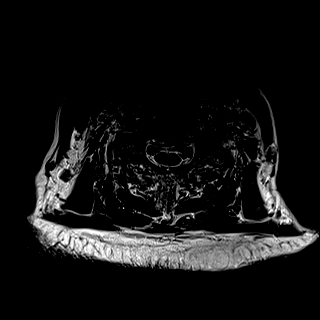
[im 15/30]
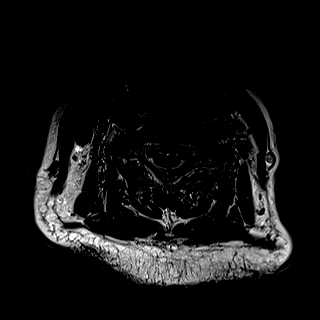
[im 17/30]
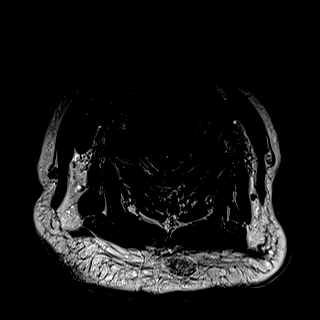
[im 20/30]
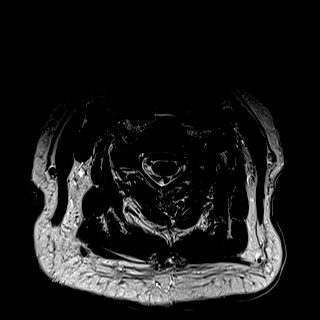
[im 25/30]
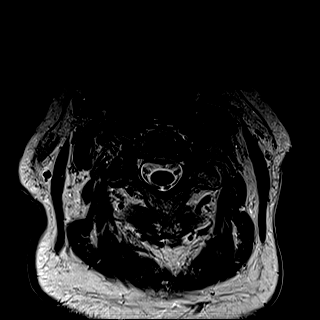
[im 30/30]
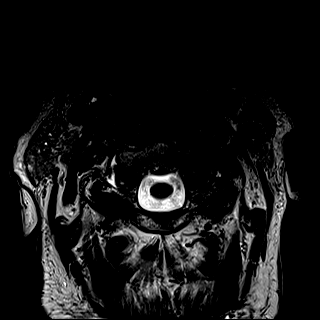

[28 of 48 positions shown; findings below may reference images not displayed]

FINDINGS: Alignment: Mild retrolisthesis C3-4 and C4-5. Mild anterolisthesis
C7-T1

Vertebrae: Negative for fracture or mass.

Cord: Cord evaluation limited by motion. There are patchy areas of
increased signal in the cord on T2 which may be due to artifact.

Posterior Fossa, vertebral arteries, paraspinal tissues: Right
thyroid nodule measuring up to 22 mm with high signal on T2 and low
signal on T1.

Disc levels:

C2-3: Mild disc degeneration.  Negative for stenosis

C3-4: Moderate left foraminal narrowing due to uncinate spurring and
facet hypertrophy. Spinal canal adequate in size. Mild right
foraminal narrowing

C4-5: Moderate foraminal narrowing bilaterally due to diffuse
uncinate spurring. No significant central canal stenosis. Bilateral
facet hypertrophy.

C5-6: Disc degeneration with diffuse uncinate spurring and bilateral
facet degeneration. Mild foraminal narrowing bilaterally

C6-7: Disc degeneration with diffuse uncinate spurring. Mild
foraminal stenosis bilaterally. Spinal canal adequate in size

C7-T1: Mild anterolisthesis. Mild facet degeneration. Negative for
stenosis.
IMPRESSION: Multilevel disc and facet degeneration. Multilevel foraminal
stenosis bilaterally due to spurring

22 mm right thyroid nodule. Recommend thyroid ultrasound (ref: [HOSPITAL]. [DATE]): 143-50).

## 2021-02-03 ENCOUNTER — Other Ambulatory Visit: Payer: Self-pay

## 2021-02-03 ENCOUNTER — Encounter: Payer: Self-pay | Admitting: Family Medicine

## 2021-02-03 DIAGNOSIS — E079 Disorder of thyroid, unspecified: Secondary | ICD-10-CM

## 2021-02-18 ENCOUNTER — Ambulatory Visit
Admission: RE | Admit: 2021-02-18 | Discharge: 2021-02-18 | Disposition: A | Discharge status: Home / Self Care | Payer: Medicare Other | Source: Ambulatory Visit | Attending: Family Medicine | Admitting: Family Medicine

## 2021-02-18 DIAGNOSIS — E079 Disorder of thyroid, unspecified: Secondary | ICD-10-CM

## 2021-02-18 DIAGNOSIS — E041 Nontoxic single thyroid nodule: Secondary | ICD-10-CM | POA: Diagnosis not present

## 2021-02-18 IMAGING — US US THYROID
1 series · 13 of 25 positions shown · non-contrast
Comparison: None.

CLINICAL DATA: Thyroid mass

EXAM:
THYROID ULTRASOUND
TECHNIQUE: Ultrasound examination of the thyroid gland and adjacent soft
tissues was performed.

[Series 1: us thyroid · 0.07mm/px · 13 of 37 slices shown]
[im 1/37]
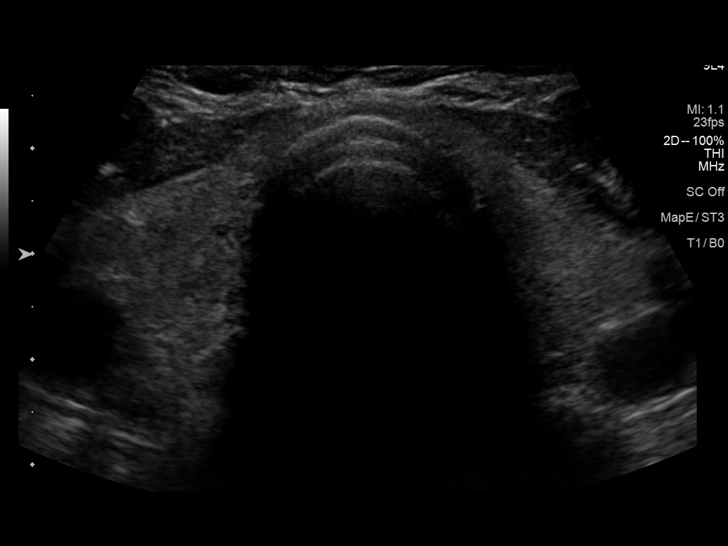
[im 4/37]
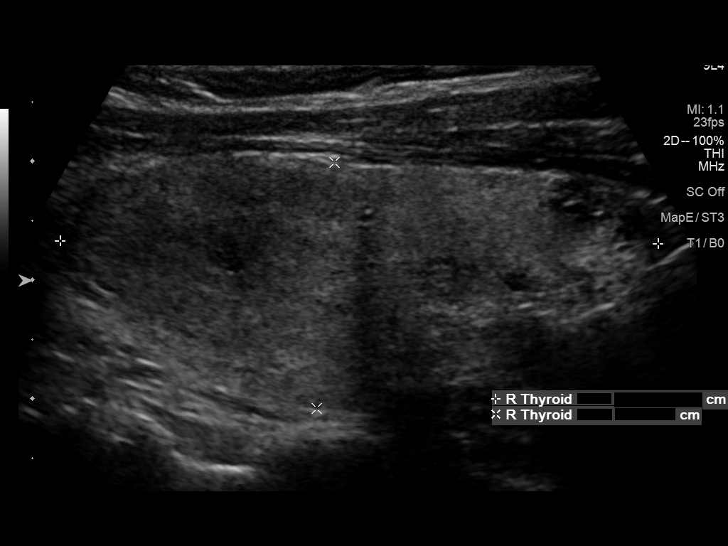
[im 7/37]
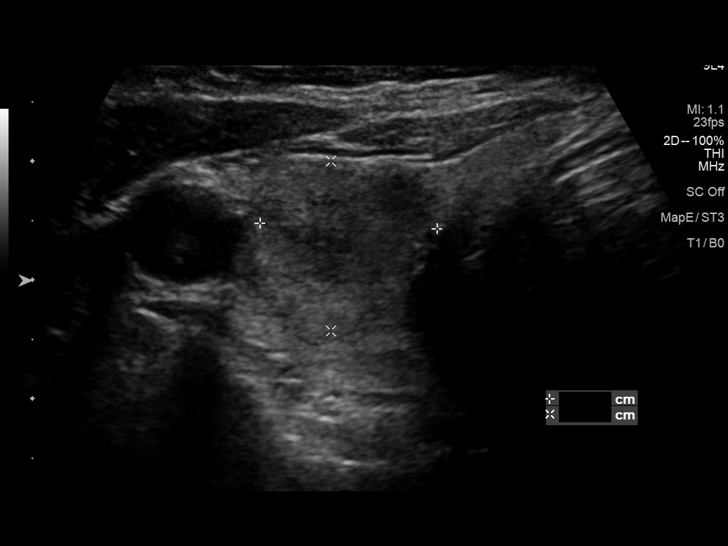
[im 10/37]
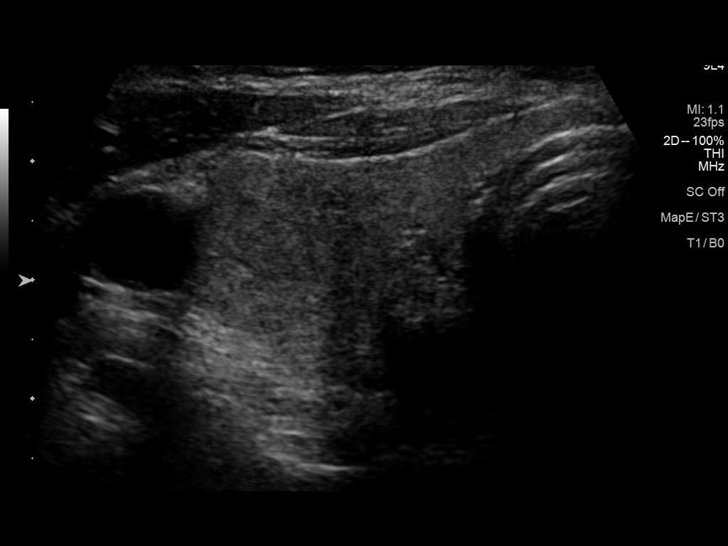
[im 13/37]
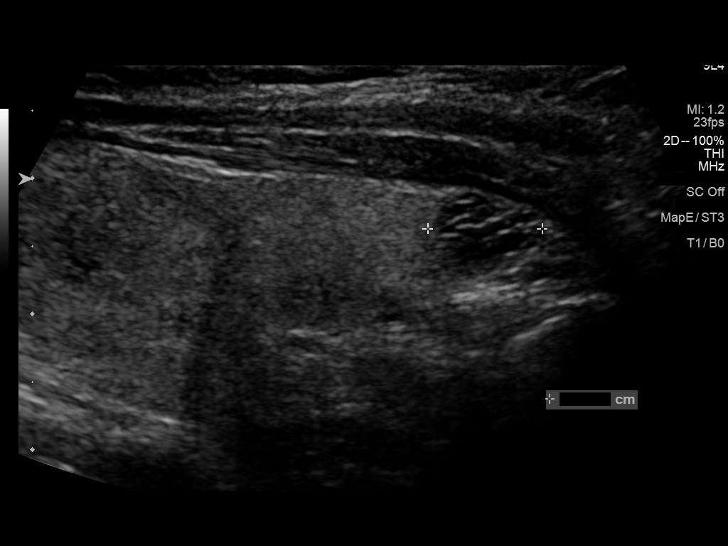
[im 16/37]
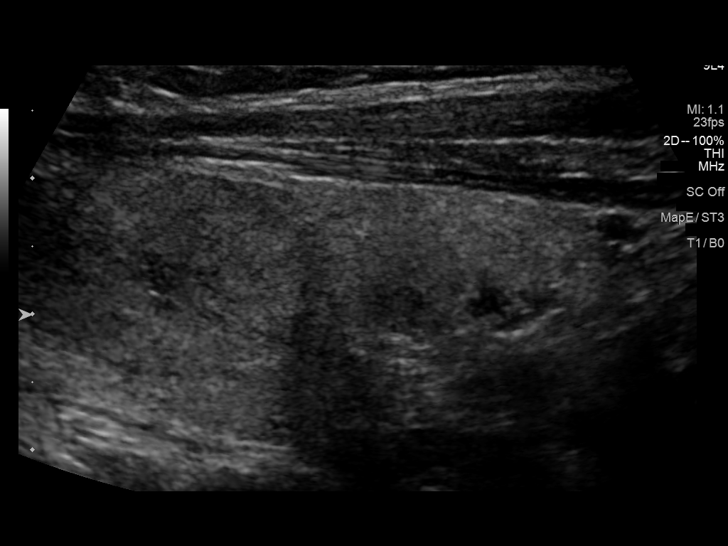
[im 19/37]
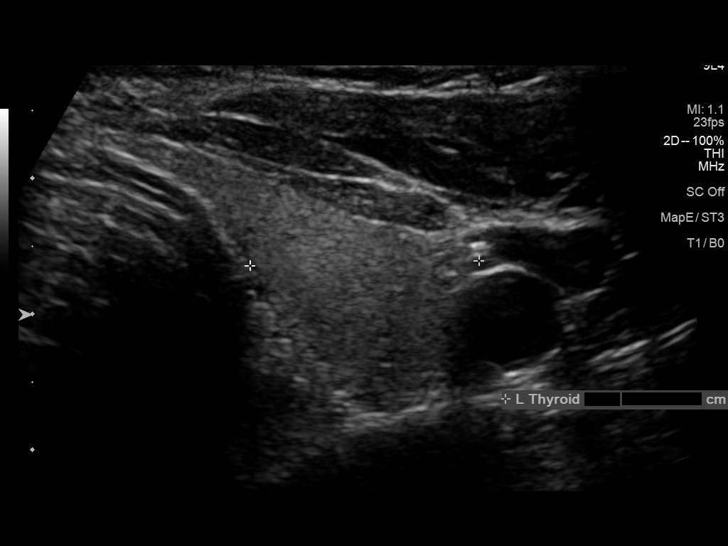
[im 22/37]
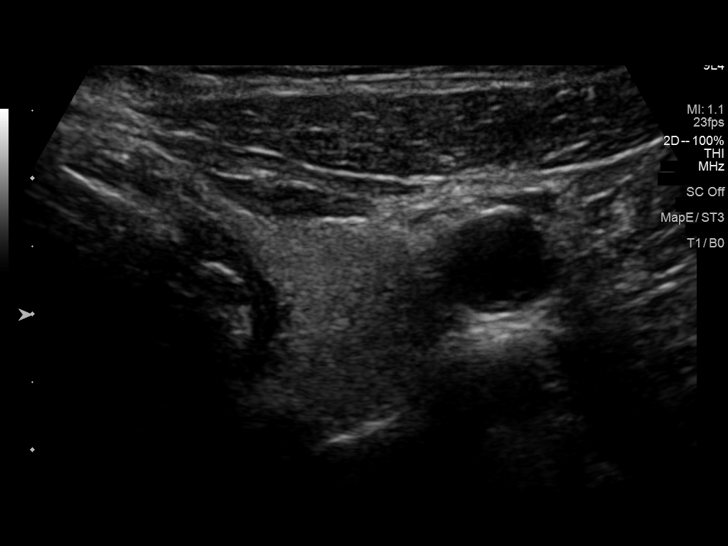
[im 25/37]
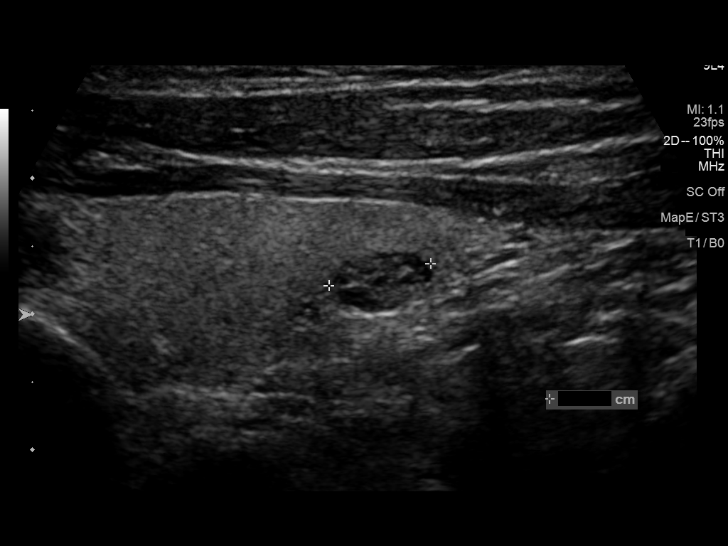
[im 28/37]
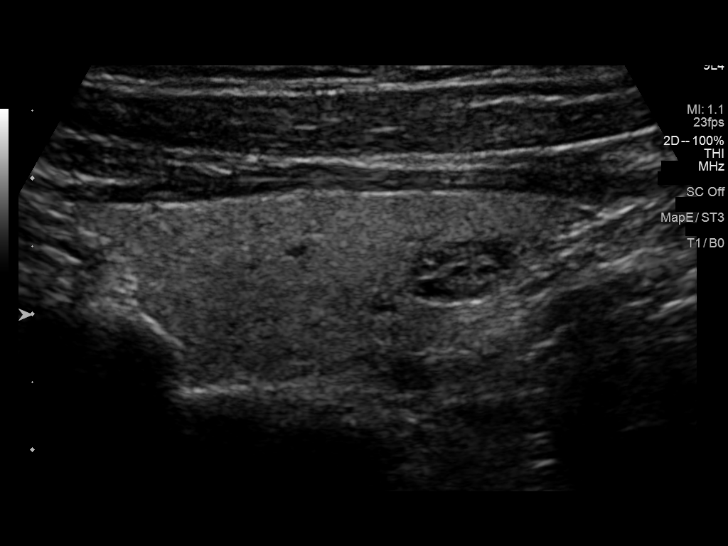
[im 31/37]
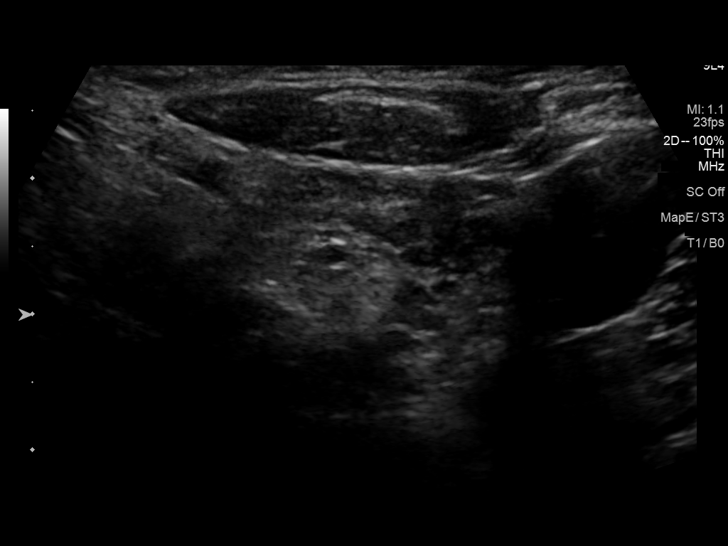
[im 34/37]
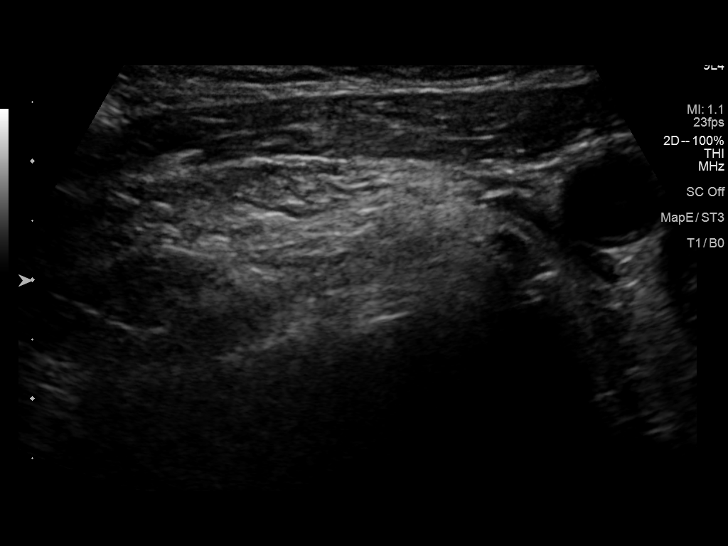
[im 37/37]
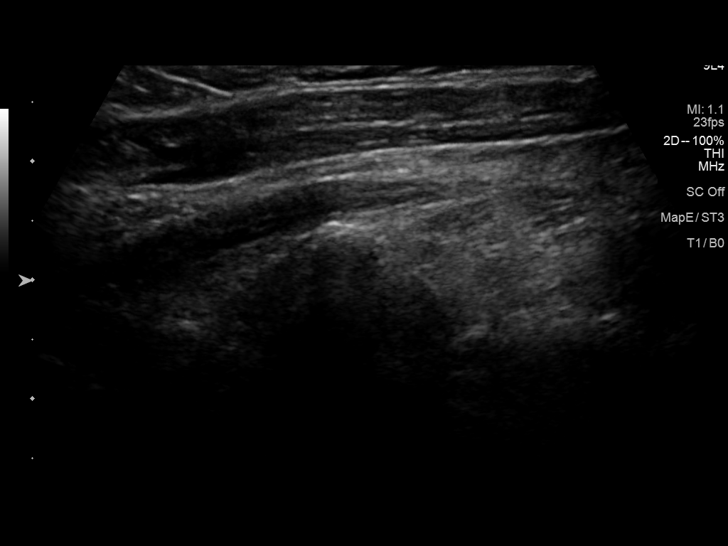

[13 of 25 positions shown; findings below may reference images not displayed]

FINDINGS: Parenchymal Echotexture: Mildly heterogeneous

Isthmus: 0.3 cm

Right lobe: 5.0 x 2.1 x 2.0 cm

Left lobe: 4.2 x 1.4 x 1.7 cm

_________________________________________________________

Estimated total number of nodules >/= 1 cm: 1

Number of spongiform nodules >/=  2 cm not described below (TR1): 0

Number of mixed cystic and solid nodules >/= 1.5 cm not described
below (TR2): 0

_________________________________________________________

Nodule # 1:

Location: Right; superior

Maximum size: 2.0 cm; Other 2 dimensions: 1.5 x 1.4 cm

Composition: solid/almost completely solid (2)

Echogenicity: isoechoic (1)

Shape: not taller-than-wide (0)

Margins: ill-defined (0)

Echogenic foci: none (0)

ACR TI-RADS total points: 3.

ACR TI-RADS risk category: TR3 (3 points).

ACR TI-RADS recommendations:

*Given size (>/= 1.5 - 2.4 cm) and appearance, a follow-up
ultrasound in 1 year should be considered based on TI-RADS criteria.

_________________________________________________________

Two additional subcentimeter spongiform nodules do not meet criteria
for surveillance or FNA.
IMPRESSION: Nodule 1 (TI-RADS 3) located in the superior right thyroid lobe
meets criteria for surveillance. Follow-up thyroid ultrasound should
be performed in 1 year.

The above is in keeping with the ACR TI-RADS recommendations - [HOSPITAL] [[place]];[DATE].
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
## 2021-02-23 ENCOUNTER — Other Ambulatory Visit: Payer: Self-pay | Admitting: Family Medicine

## 2021-02-23 ENCOUNTER — Other Ambulatory Visit: Payer: Self-pay

## 2021-02-23 ENCOUNTER — Encounter: Payer: Self-pay | Admitting: Family Medicine

## 2021-02-23 ENCOUNTER — Ambulatory Visit (INDEPENDENT_AMBULATORY_CARE_PROVIDER_SITE_OTHER): Payer: Medicare Other

## 2021-02-23 ENCOUNTER — Ambulatory Visit: Payer: Self-pay

## 2021-02-23 ENCOUNTER — Ambulatory Visit (INDEPENDENT_AMBULATORY_CARE_PROVIDER_SITE_OTHER): Payer: Medicare Other | Admitting: Family Medicine

## 2021-02-23 VITALS — BP 126/70 | HR 81 | Ht 73.0 in | Wt 177.0 lb

## 2021-02-23 DIAGNOSIS — M25511 Pain in right shoulder: Secondary | ICD-10-CM | POA: Diagnosis not present

## 2021-02-23 DIAGNOSIS — M75112 Incomplete rotator cuff tear or rupture of left shoulder, not specified as traumatic: Secondary | ICD-10-CM

## 2021-02-23 DIAGNOSIS — I2510 Atherosclerotic heart disease of native coronary artery without angina pectoris: Secondary | ICD-10-CM

## 2021-02-23 DIAGNOSIS — G8929 Other chronic pain: Secondary | ICD-10-CM | POA: Diagnosis not present

## 2021-02-23 DIAGNOSIS — R972 Elevated prostate specific antigen [PSA]: Secondary | ICD-10-CM

## 2021-02-23 DIAGNOSIS — M25512 Pain in left shoulder: Secondary | ICD-10-CM | POA: Diagnosis not present

## 2021-02-23 DIAGNOSIS — R079 Chest pain, unspecified: Secondary | ICD-10-CM

## 2021-02-23 DIAGNOSIS — E038 Other specified hypothyroidism: Secondary | ICD-10-CM

## 2021-02-23 DIAGNOSIS — M5030 Other cervical disc degeneration, unspecified cervical region: Secondary | ICD-10-CM

## 2021-02-23 DIAGNOSIS — M2550 Pain in unspecified joint: Secondary | ICD-10-CM

## 2021-02-23 DIAGNOSIS — Z8679 Personal history of other diseases of the circulatory system: Secondary | ICD-10-CM | POA: Diagnosis not present

## 2021-02-23 LAB — CBC WITH DIFFERENTIAL/PLATELET
Basophils Absolute: 0 10*3/uL (ref 0.0–0.1)
Basophils Relative: 0.9 % (ref 0.0–3.0)
Eosinophils Absolute: 0.1 10*3/uL (ref 0.0–0.7)
Eosinophils Relative: 1.4 % (ref 0.0–5.0)
HCT: 38.2 % — ABNORMAL LOW (ref 39.0–52.0)
Hemoglobin: 12.7 g/dL — ABNORMAL LOW (ref 13.0–17.0)
Lymphocytes Relative: 19.8 % (ref 12.0–46.0)
Lymphs Abs: 1 10*3/uL (ref 0.7–4.0)
MCHC: 33.3 g/dL (ref 30.0–36.0)
MCV: 86.7 fl (ref 78.0–100.0)
Monocytes Absolute: 0.4 10*3/uL (ref 0.1–1.0)
Monocytes Relative: 7 % (ref 3.0–12.0)
Neutro Abs: 3.6 10*3/uL (ref 1.4–7.7)
Neutrophils Relative %: 70.9 % (ref 43.0–77.0)
Platelets: 234 10*3/uL (ref 150.0–400.0)
RBC: 4.41 Mil/uL (ref 4.22–5.81)
RDW: 15.6 % — ABNORMAL HIGH (ref 11.5–15.5)
WBC: 5 10*3/uL (ref 4.0–10.5)

## 2021-02-23 LAB — SEDIMENTATION RATE: Sed Rate: 23 mm/hr — ABNORMAL HIGH (ref 0–20)

## 2021-02-23 LAB — PSA: PSA: 5.21 ng/mL — ABNORMAL HIGH (ref 0.10–4.00)

## 2021-02-23 LAB — COMPREHENSIVE METABOLIC PANEL
ALT: 13 U/L (ref 0–53)
AST: 15 U/L (ref 0–37)
Albumin: 4.3 g/dL (ref 3.5–5.2)
Alkaline Phosphatase: 99 U/L (ref 39–117)
BUN: 23 mg/dL (ref 6–23)
CO2: 30 mEq/L (ref 19–32)
Calcium: 10.3 mg/dL (ref 8.4–10.5)
Chloride: 102 mEq/L (ref 96–112)
Creatinine, Ser: 0.88 mg/dL (ref 0.40–1.50)
GFR: 75.62 mL/min (ref >60.00)
Glucose, Bld: 151 mg/dL — ABNORMAL HIGH (ref 70–99)
Potassium: 4.3 mEq/L (ref 3.5–5.1)
Sodium: 140 mEq/L (ref 135–145)
Total Bilirubin: 0.5 mg/dL (ref 0.2–1.2)
Total Protein: 6.8 g/dL (ref 6.0–8.3)

## 2021-02-23 LAB — TSH: TSH: 1.18 u[IU]/mL (ref 0.35–4.50)

## 2021-02-23 IMAGING — DX DG CHEST 2V
2 series · 2 of 2 positions shown · non-contrast
Comparison: [DATE]

CLINICAL DATA: History of atrial fibrillation. History of chest
pain.

EXAM:
CHEST - 2 VIEW

[chest pa]
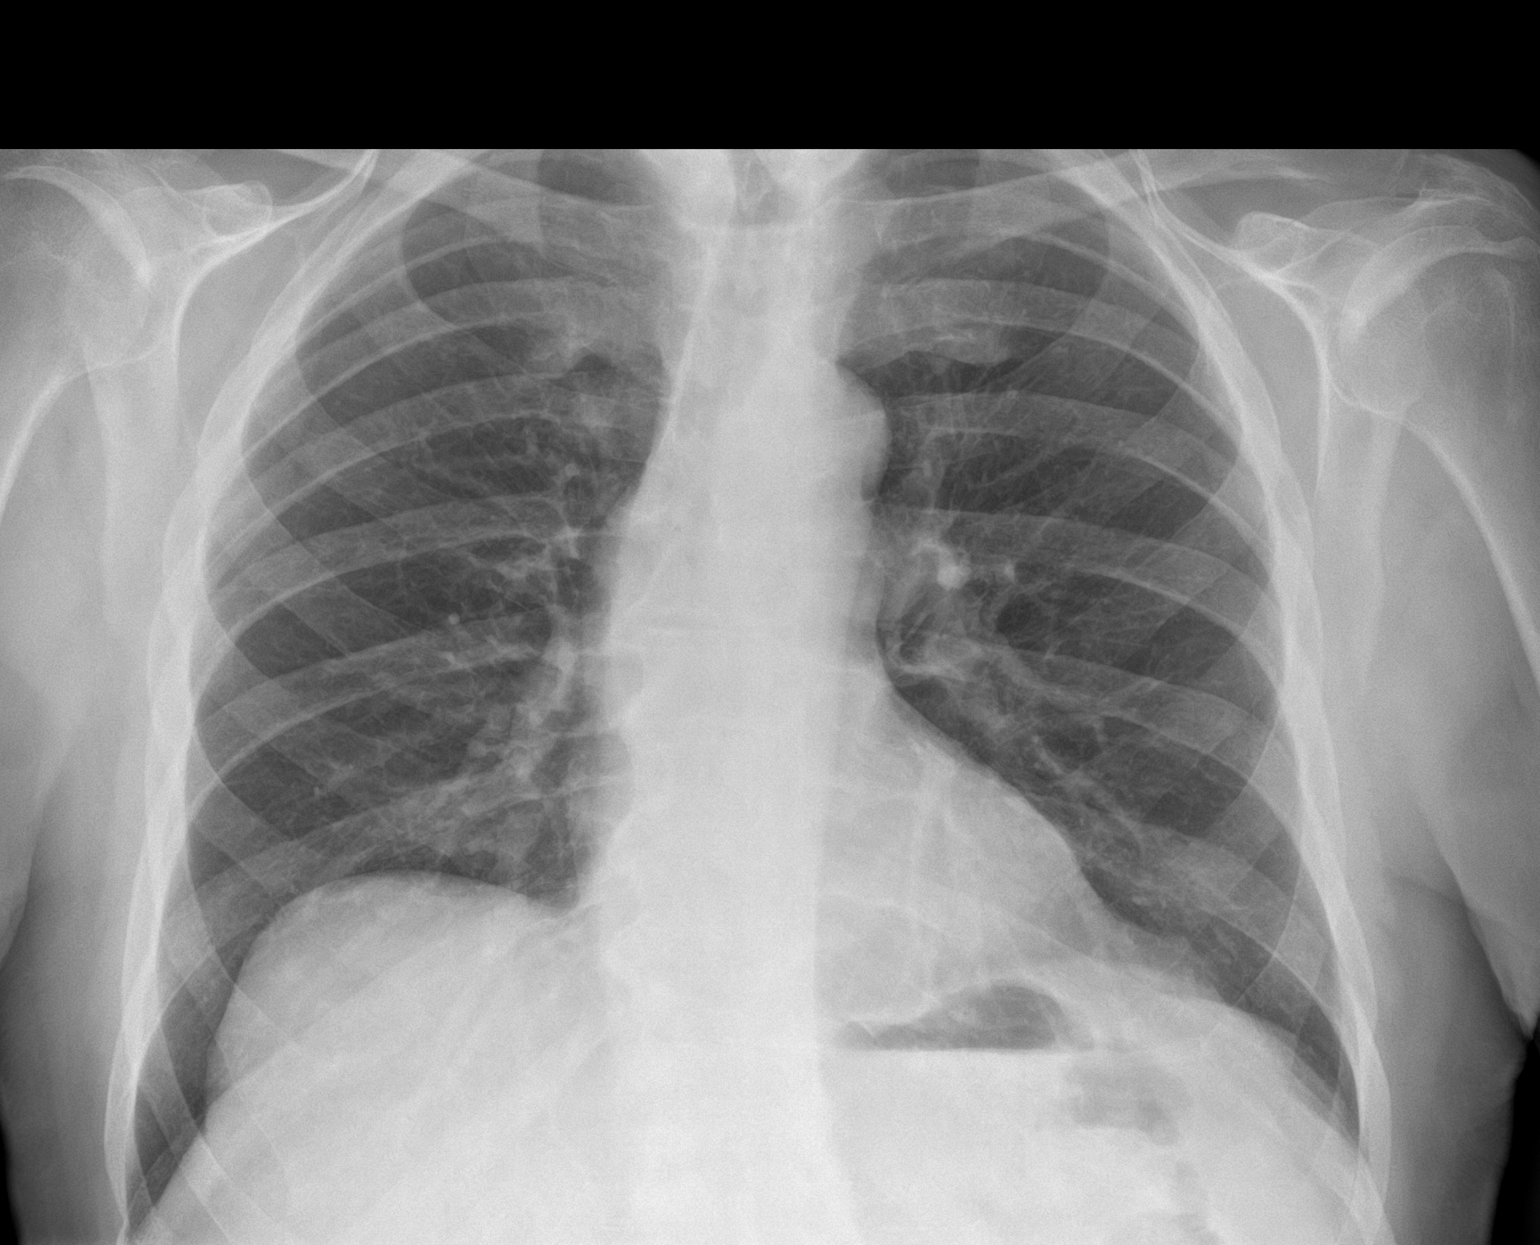

[chest lat]
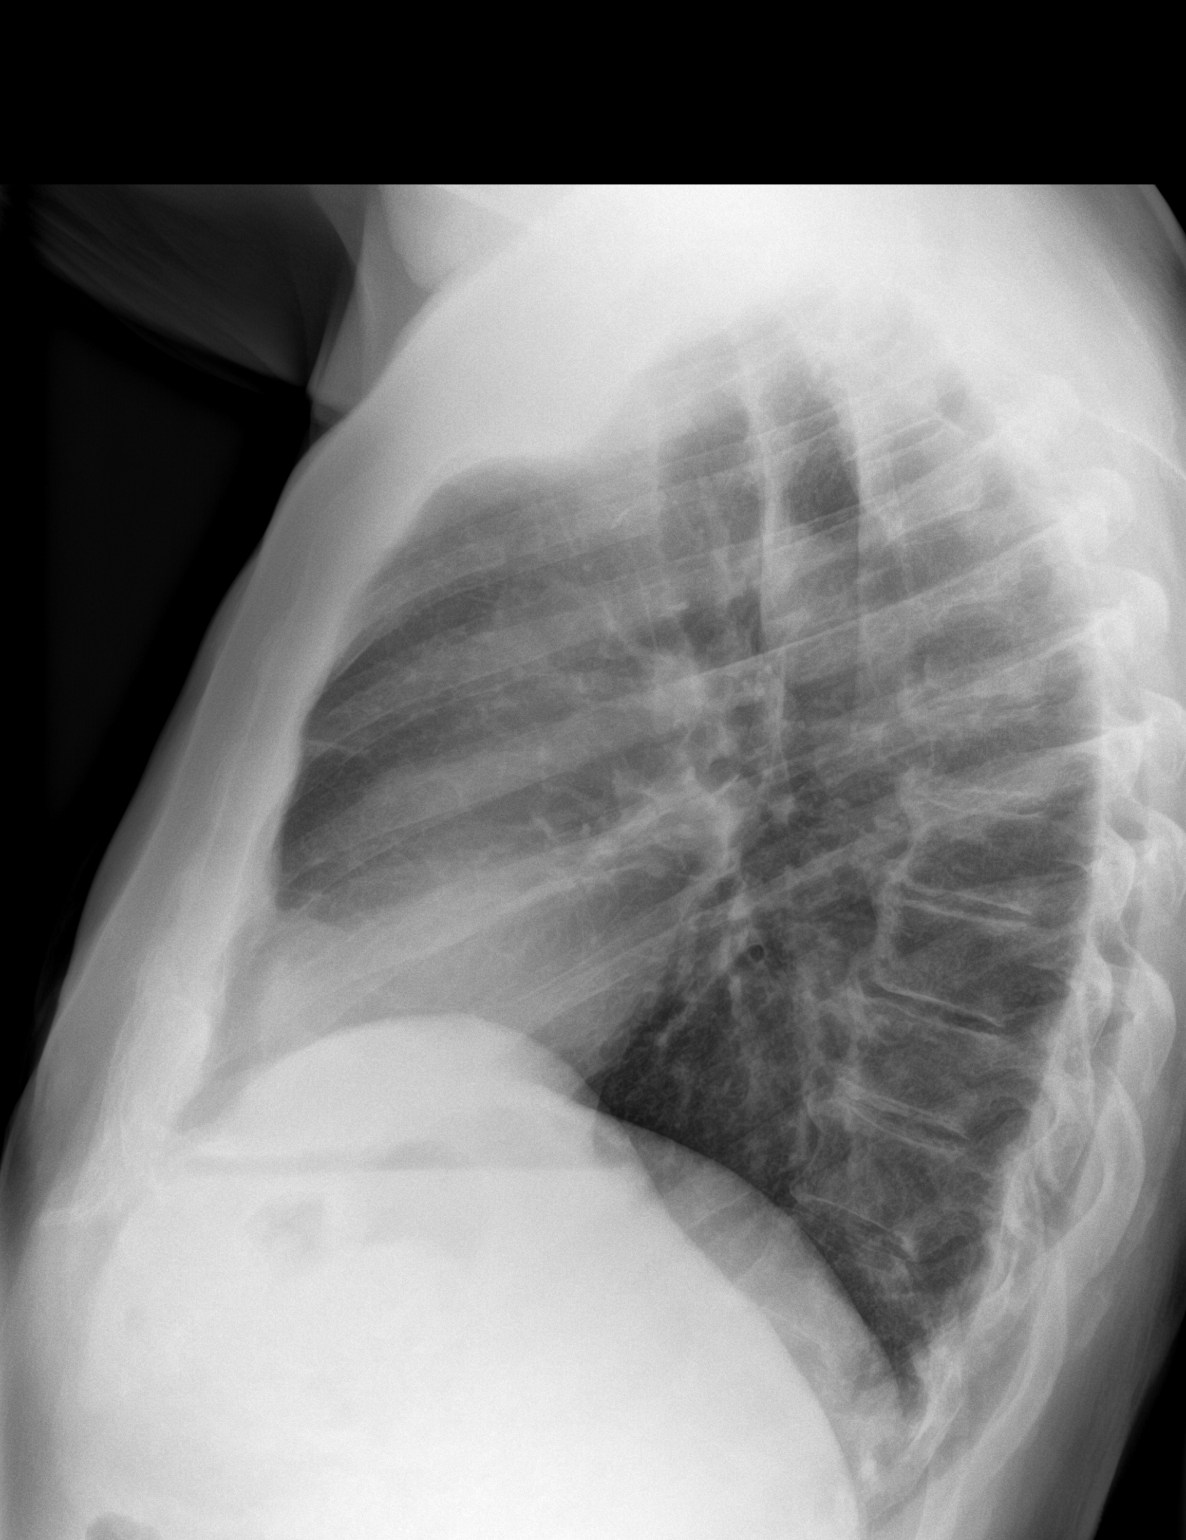

[2 of 2 positions shown; findings below may reference images not displayed]

FINDINGS: Heart size is normal. Mediastinal shadows are normal. The pulmonary
vascularity is normal. The lungs are clear. Chronic curvature of the
spine.
IMPRESSION: No active cardiopulmonary disease.  Chronic spinal curvature.

## 2021-02-23 MED ORDER — ESCITALOPRAM OXALATE 10 MG PO TABS: 10.0000 mg | ORAL_TABLET | Freq: Every day | ORAL | 0 refills | Status: DC

## 2021-02-23 NOTE — Assessment & Plan Note (Signed)
Patient has degenerative disc disease of the cervical spine.  MRI does show this but no significant nerve impingement noted.  Discussed the potential for the epidural but with patient having a pain in the bilateral shoulders patient would rather have the MRI of the shoulders to further evaluate that first.  Depending on findings and we can discuss the possibility of injections or surgical intervention.  Even though patient is 87 years old I do think he could do well with a rotator cuff repair.  Patient would not want to do surgery though if there is any other possibilities.  Patient was making significant improvement with the Cymbalta previously but is feeling like he is having worsening dreams.  We will discontinue this at the moment and start a very low-dose of Lexapro.  Warned of potential side effects but likely should do relatively well.  Follow-up with me again in 6 weeks

## 2021-02-23 NOTE — Patient Instructions (Addendum)
B shoulder MRI [id number] Chest xray Stop Cymbalta Lexapro 10mg  daily Labs See me again in 6 weeks (30 in please)

## 2021-02-23 NOTE — Assessment & Plan Note (Signed)
Patient was making some improvement but unfortunately seems to have been potentially worsening at this point.  Because patient is having bilateral shoulder pain did think it was more secondary to the neck but with MRI findings do feel that further advanced imaging is in the shoulder.  Patient could potentially be a candidate for rotator cuff repair because of how active patient is.  We will get MRI findings and see what it is and change medical management as appropriately.

## 2021-02-24 ENCOUNTER — Encounter: Payer: Self-pay | Admitting: Family Medicine

## 2021-02-24 ENCOUNTER — Telehealth: Payer: Self-pay

## 2021-02-24 ENCOUNTER — Other Ambulatory Visit: Payer: Self-pay | Admitting: Family Medicine

## 2021-02-24 ENCOUNTER — Other Ambulatory Visit: Payer: Self-pay

## 2021-02-24 DIAGNOSIS — R972 Elevated prostate specific antigen [PSA]: Secondary | ICD-10-CM

## 2021-02-24 LAB — LACTATE DEHYDROGENASE: LDH: 108 U/L — ABNORMAL LOW (ref 120–250)

## 2021-02-24 LAB — PTH, INTACT AND CALCIUM
Calcium: 10.3 mg/dL (ref 8.6–10.3)
PTH: 50 pg/mL (ref 16–77)

## 2021-02-24 NOTE — Telephone Encounter (Signed)
Called and spoke with pt wife and pt scheduled for future PSA.

## 2021-02-24 NOTE — Telephone Encounter (Signed)
-----   Message from [name], MD sent at 02/24/2021  1:35 PM EDT -----   ----- Message ----- From: [name], DO Sent: 02/23/2021   4:37 PM EDT To: [name], MD

## 2021-02-24 NOTE — Progress Notes (Signed)
Team please set up repeat PSA for patient in 1 month. No sex or vigorous exercise for 48 hours prior to PSA. If number remains high I will want to chat with him about referral at that time.   Thanks Dr. Tamala Julian for forwarding to [place]

## 2021-02-28 ENCOUNTER — Other Ambulatory Visit: Payer: Self-pay | Admitting: Family Medicine

## 2021-03-05 ENCOUNTER — Ambulatory Visit
Admission: RE | Admit: 2021-03-05 | Discharge: 2021-03-05 | Disposition: A | Discharge status: Home / Self Care | Payer: Medicare Other | Source: Ambulatory Visit | Attending: Family Medicine | Admitting: Family Medicine

## 2021-03-05 ENCOUNTER — Other Ambulatory Visit: Payer: Self-pay

## 2021-03-05 DIAGNOSIS — M25512 Pain in left shoulder: Secondary | ICD-10-CM

## 2021-03-05 DIAGNOSIS — M19012 Primary osteoarthritis, left shoulder: Secondary | ICD-10-CM | POA: Diagnosis not present

## 2021-03-05 DIAGNOSIS — G8929 Other chronic pain: Secondary | ICD-10-CM

## 2021-03-05 DIAGNOSIS — M25412 Effusion, left shoulder: Secondary | ICD-10-CM | POA: Diagnosis not present

## 2021-03-05 DIAGNOSIS — M25511 Pain in right shoulder: Secondary | ICD-10-CM | POA: Diagnosis not present

## 2021-03-05 IMAGING — MR MR SHOULDER*R* W/O CM
6 series · 38 of 40 positions shown · non-contrast
Comparison: None.

CLINICAL DATA: Right shoulder pain.

EXAM:
MRI OF THE RIGHT SHOULDER WITHOUT CONTRAST
TECHNIQUE: Multiplanar, multisequence MR imaging of the shoulder was performed.
No intravenous contrast was administered.

[Series 3: T2 fat-sat · axial · 4.0mm · 0.55mm/px · z∈[-73,+36]mm · 8 of 25 slices shown (1 of 4)]
[im 1/25]
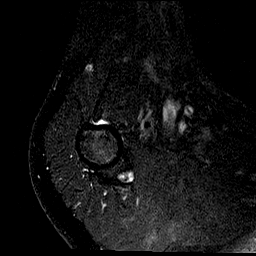
[im 4/25]
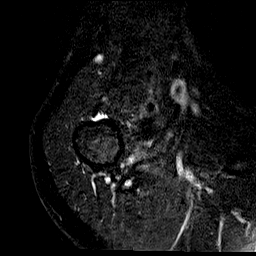
[im 7/25]
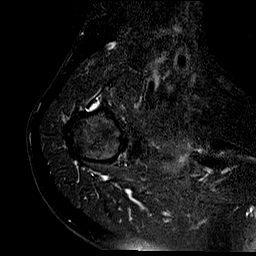
[im 11/25]
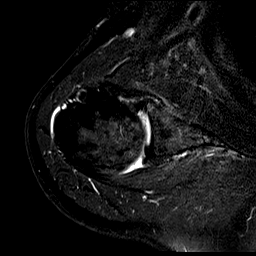
[im 14/25]
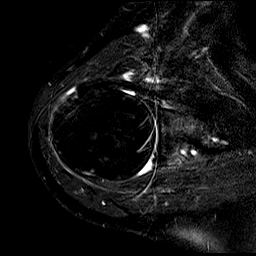
[im 18/25]
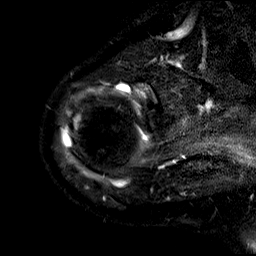
[im 21/25]
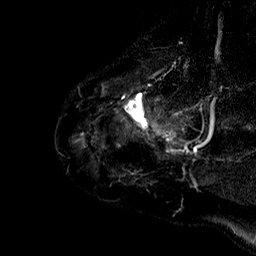
[im 25/25]
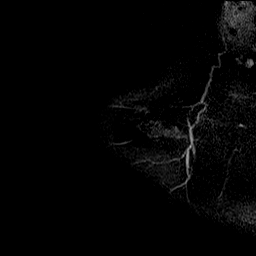

[Series 4: T2 fat-sat · oblique · 4.0mm · 0.59mm/px · 6 of 21 slices shown (2 of 4)]
[im 1/21]
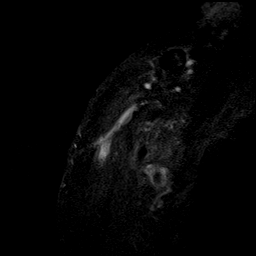
[im 5/21]
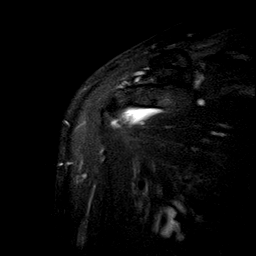
[im 9/21]
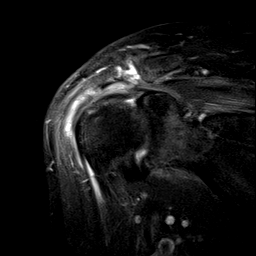
[im 13/21]
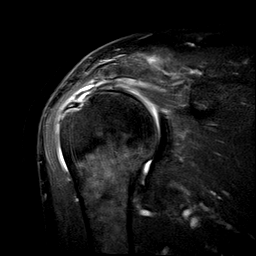
[im 17/21]
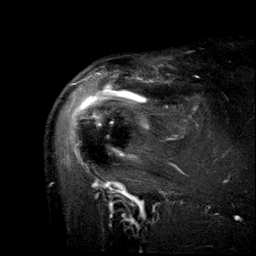
[im 21/21]
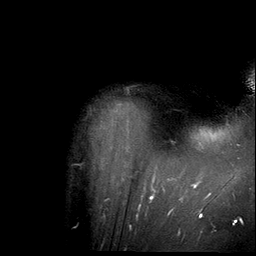

[Series 6: T2 fat-sat · oblique · 4.0mm · 0.59mm/px · 7 of 24 slices shown (3 of 4)]
[im 1/24]
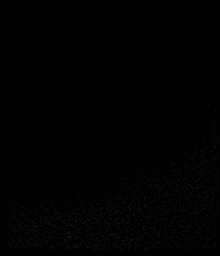
[im 4/24]
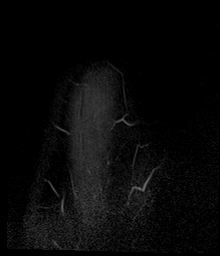
[im 8/24]
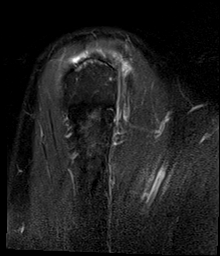
[im 12/24]
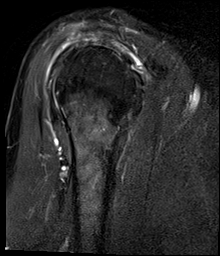
[im 16/24]
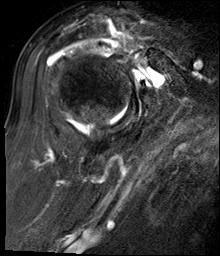
[im 20/24]
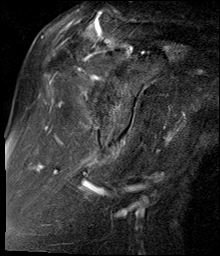
[im 24/24]
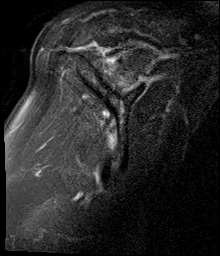

[Series 7: T1 · oblique · 4.0mm · 0.29mm/px · 5 of 25 slices shown]
[im 1/25]
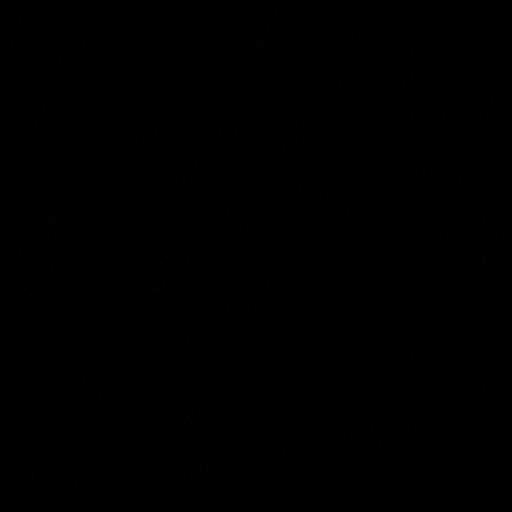
[im 5/25]
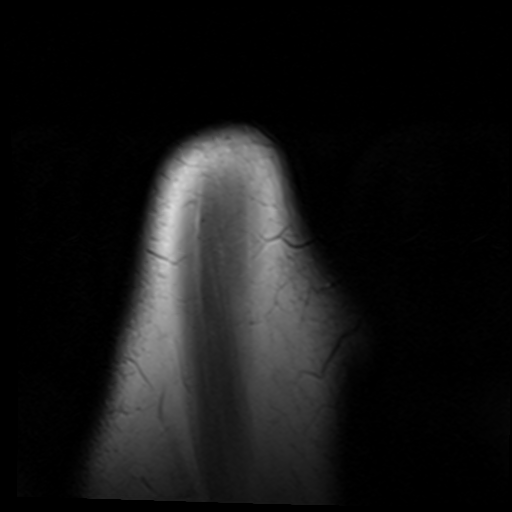
[im 9/25]
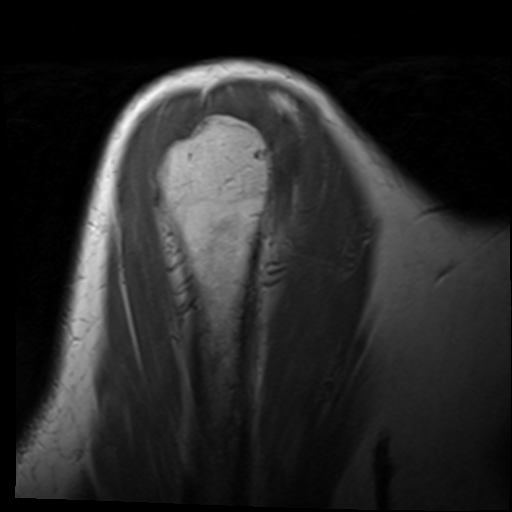
[im 13/25]
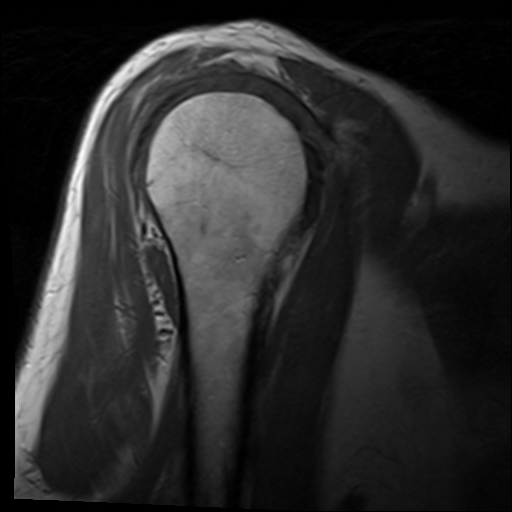
[im 17/25]
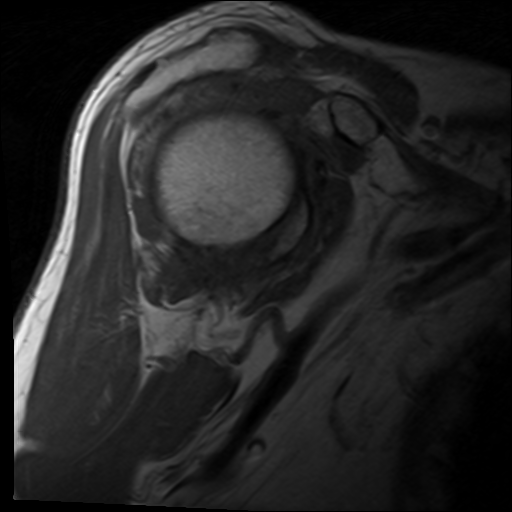

[Series 8: PD · oblique · 4.0mm · 0.29mm/px · 6 of 21 slices shown]
[im 1/21]
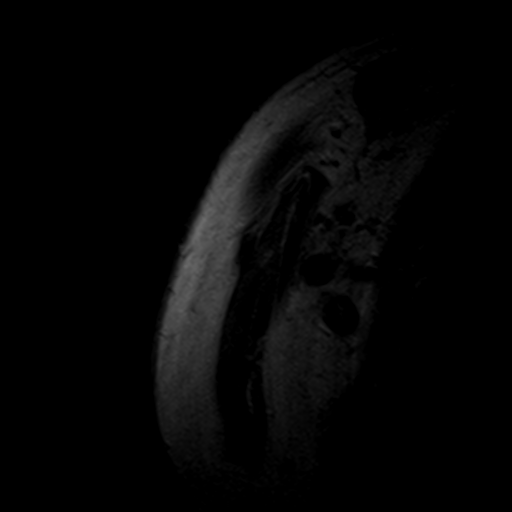
[im 5/21]
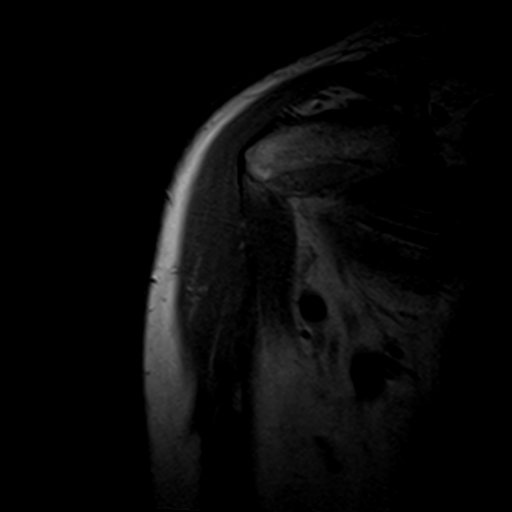
[im 9/21]
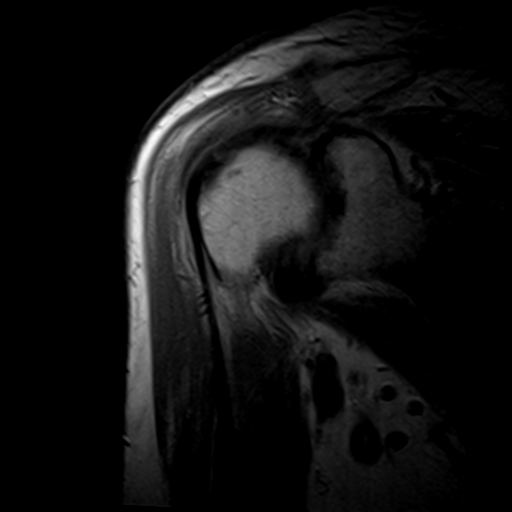
[im 13/21]
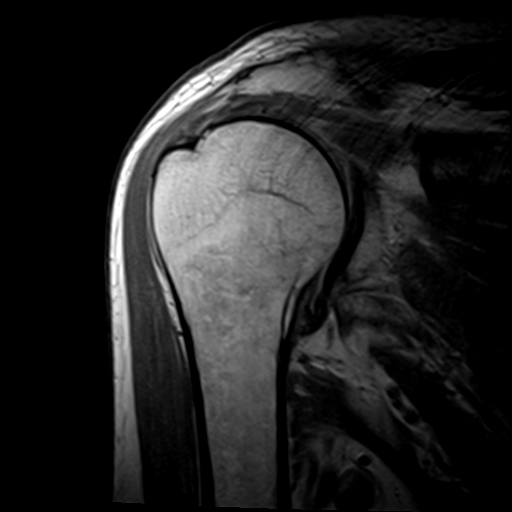
[im 17/21]
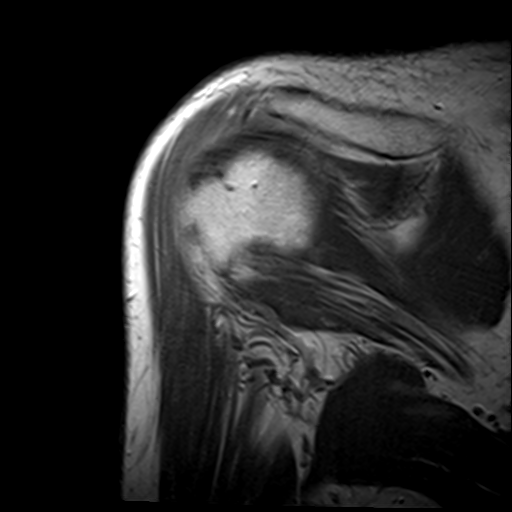
[im 21/21]
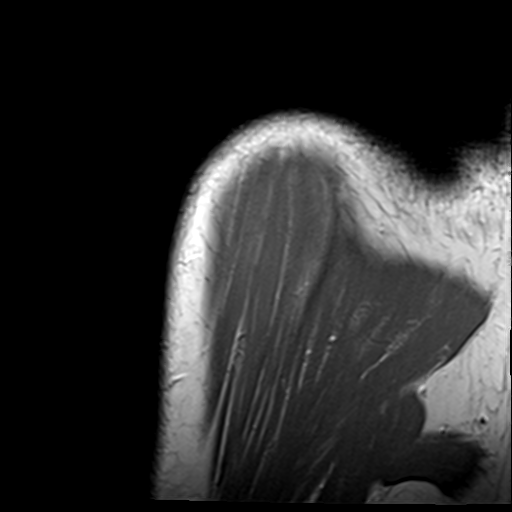

[Series 9: T2 fat-sat · oblique · 4.0mm · 0.59mm/px · 6 of 21 slices shown (4 of 4)]
[im 1/21]
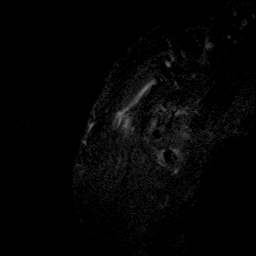
[im 5/21]
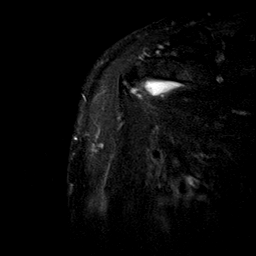
[im 9/21]
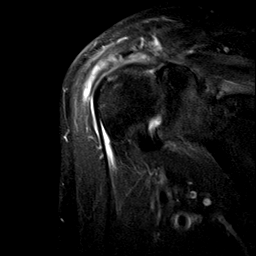
[im 13/21]
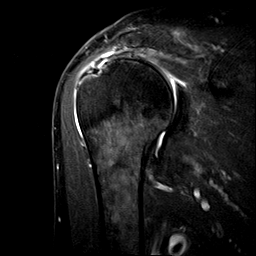
[im 17/21]
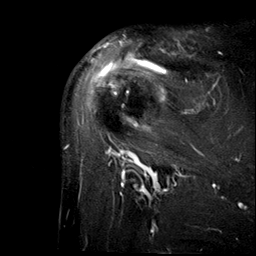
[im 21/21]
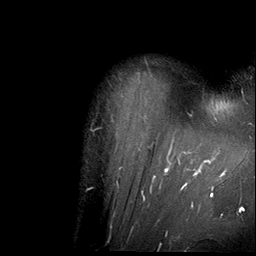

[38 of 40 positions shown; findings below may reference images not displayed]

FINDINGS: Patient motion degrades image quality limiting evaluation.

Rotator cuff: Moderate tendinosis of the supraspinatus tendon with a
small full-thickness tear and 14 mm of retraction of the posterior
fibers. Moderate tendinosis of the infraspinatus tendon with a small
full-thickness tear of the anterior fibers. Teres minor tendon is
intact. Subscapularis tendon is intact.

Muscles: No muscle atrophy or edema. No intramuscular fluid
collection or hematoma.

Biceps Long Head: Intraarticular and extraarticular portions of the
biceps tendon are intact.

Acromioclavicular Joint: Moderate arthropathy of the
acromioclavicular joint. Type II acromion. Small amount of
subacromial/subdeltoid bursal fluid.

Glenohumeral Joint: Small joint effusion. Mild partial-thickness
cartilage loss of the glenohumeral joint.

Labrum: Grossly intact, but evaluation is limited by lack of
intraarticular fluid/contrast.

Bones: No fracture or dislocation. No aggressive osseous lesion.

Other: No fluid collection or hematoma.
IMPRESSION: 1. Moderate tendinosis of the supraspinatus tendon with a small
full-thickness tear and 14 mm of retraction of the posterior fibers.
2. Moderate tendinosis of the infraspinatus tendon with a small
full-thickness tear of the anterior fibers.
3. Mild subacromial/subdeltoid bursitis.

## 2021-03-05 IMAGING — MR MR SHOULDER*L* W/O CM
8 series · 40 of 40 positions shown · non-contrast
Comparison: None.

CLINICAL DATA: Chronic bilateral shoulder pain.  No known injury.

EXAM:
MRI OF THE LEFT SHOULDER WITHOUT CONTRAST
TECHNIQUE: Multiplanar, multisequence MR imaging of the shoulder was performed.
No intravenous contrast was administered.

[Series 3: T2 fat-sat · axial · 4.0mm · 0.55mm/px · z∈[-95,+20]mm · 6 of 26 slices shown (1 of 6)]
[im 1/26]
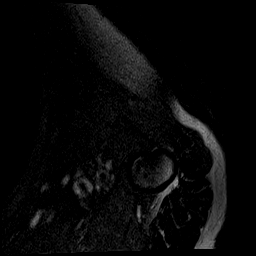
[im 6/26]
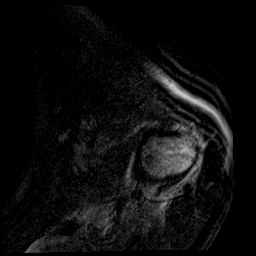
[im 11/26]
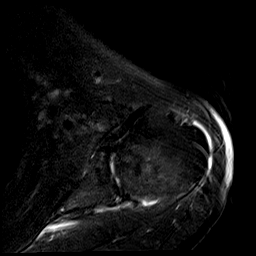
[im 16/26]
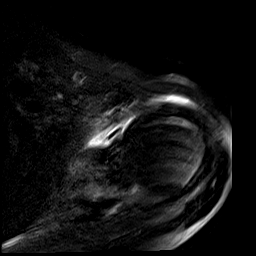
[im 21/26]
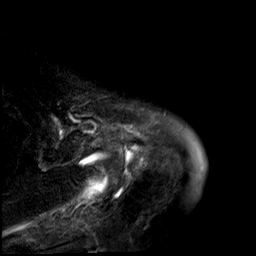
[im 26/26]
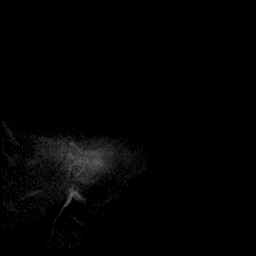

[Series 4: T2 fat-sat · axial · 4.0mm · 0.55mm/px · z∈[-95,+20]mm · 6 of 26 slices shown (2 of 6)]
[im 1/26]
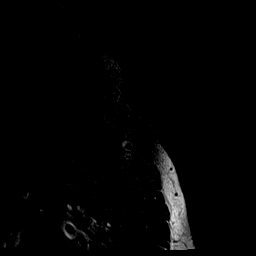
[im 6/26]
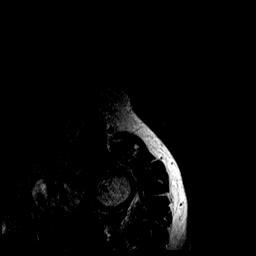
[im 11/26]
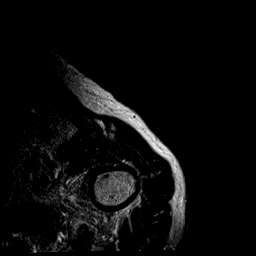
[im 16/26]
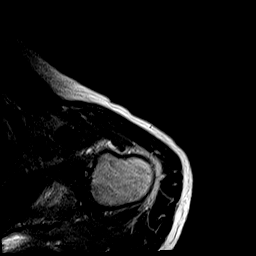
[im 21/26]
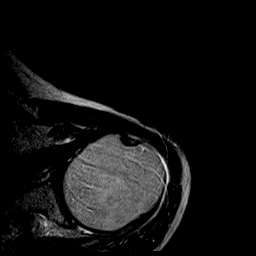
[im 26/26]
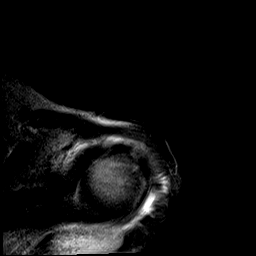

[Series 5: T2 fat-sat · axial · 4.0mm · 0.55mm/px · z∈[-90,+25]mm · 6 of 26 slices shown (3 of 6)]
[im 1/26]
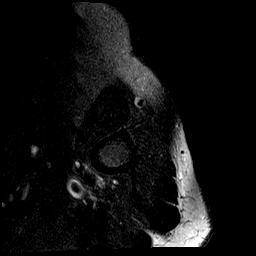
[im 6/26]
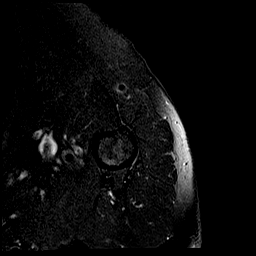
[im 11/26]
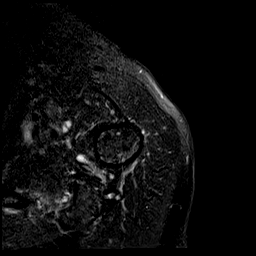
[im 16/26]
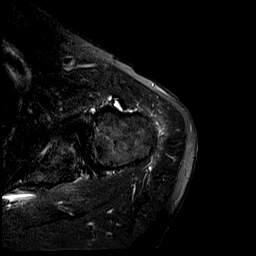
[im 21/26]
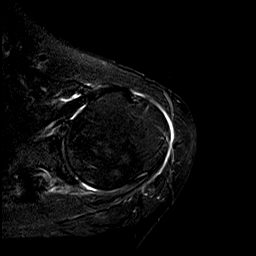
[im 26/26]
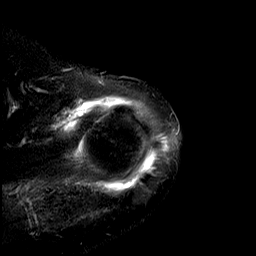

[Series 6: T2 fat-sat · oblique · 4.0mm · 0.55mm/px · 4 of 21 slices shown (4 of 6)]
[im 1/21]
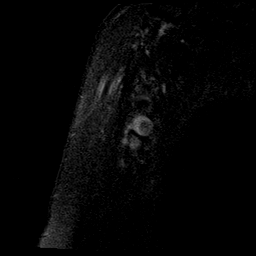
[im 7/21]
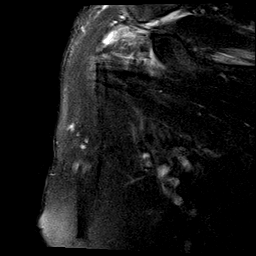
[im 14/21]
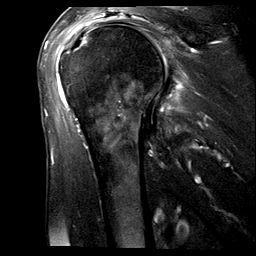
[im 21/21]
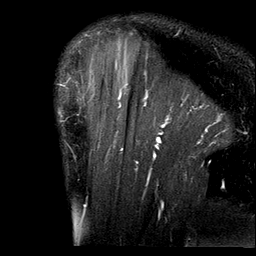

[Series 7: PD · oblique · 4.0mm · 0.27mm/px · 4 of 21 slices shown]
[im 1/21]
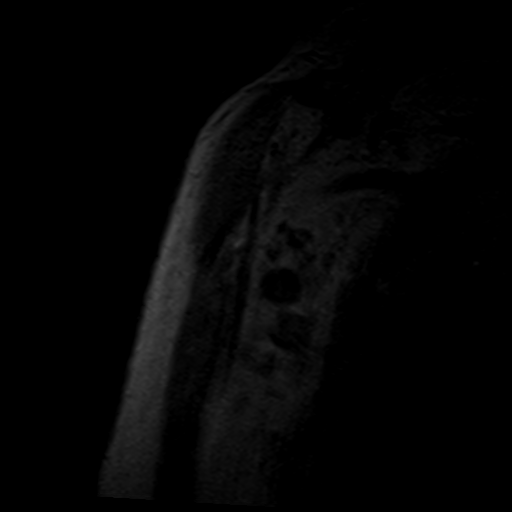
[im 7/21]
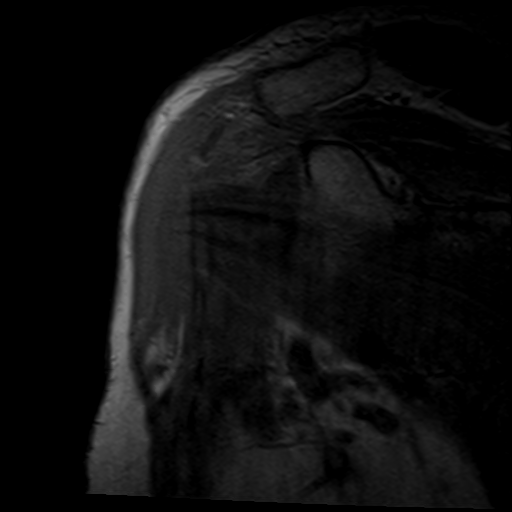
[im 14/21]
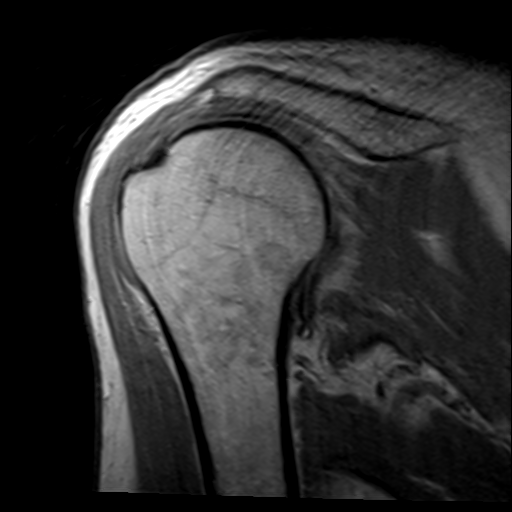
[im 21/21]
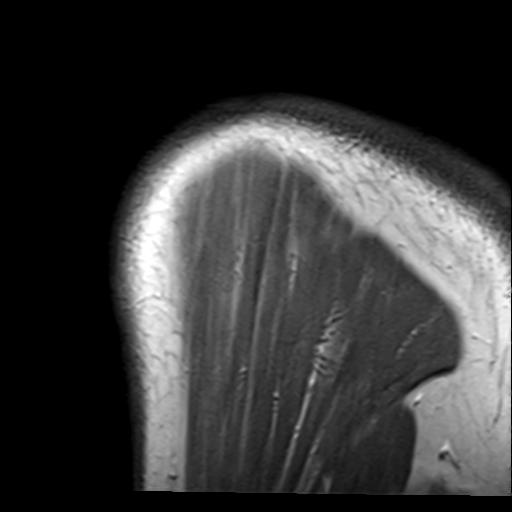

[Series 8: T2 fat-sat · oblique · 4.0mm · 0.55mm/px · 4 of 21 slices shown (5 of 6)]
[im 1/21]
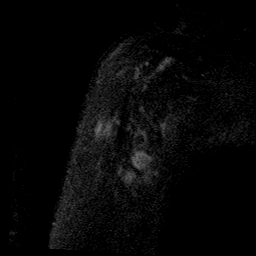
[im 7/21]
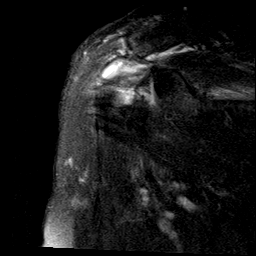
[im 14/21]
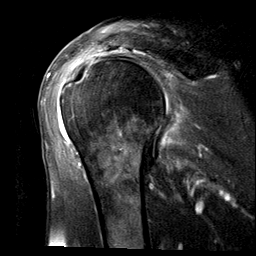
[im 21/21]
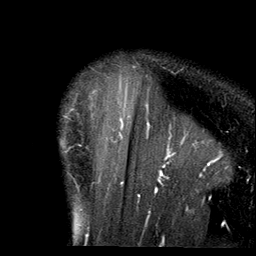

[Series 9: T2 fat-sat · oblique · 4.0mm · 0.55mm/px · 5 of 25 slices shown (6 of 6)]
[im 1/25]
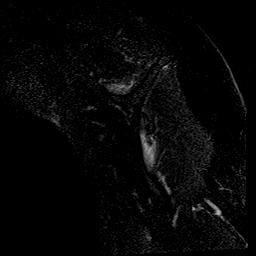
[im 7/25]
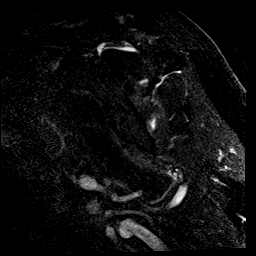
[im 13/25]
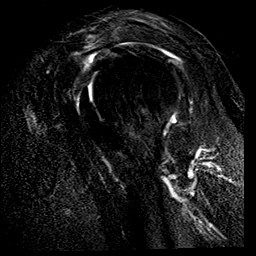
[im 19/25]
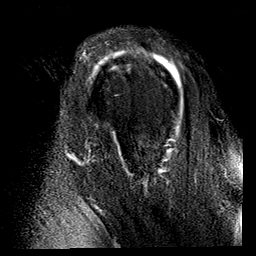
[im 25/25]
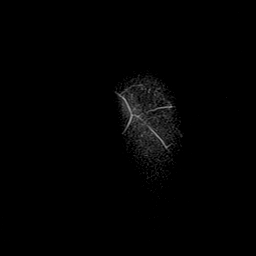

[Series 10: T1 · oblique · 4.0mm · 0.27mm/px · 5 of 23 slices shown]
[im 1/23]
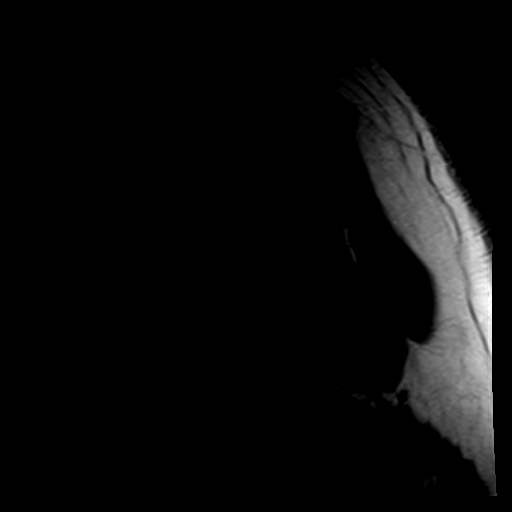
[im 6/23]
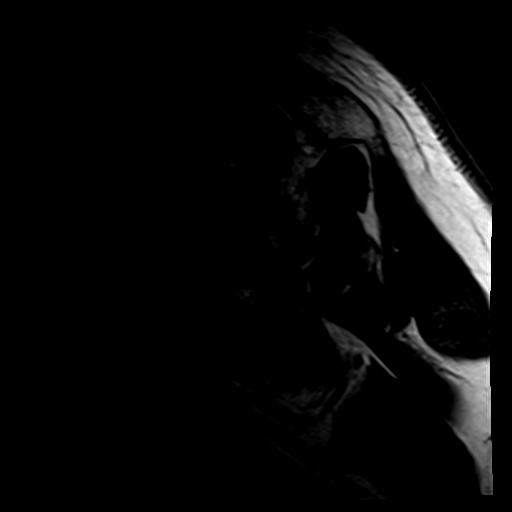
[im 12/23]
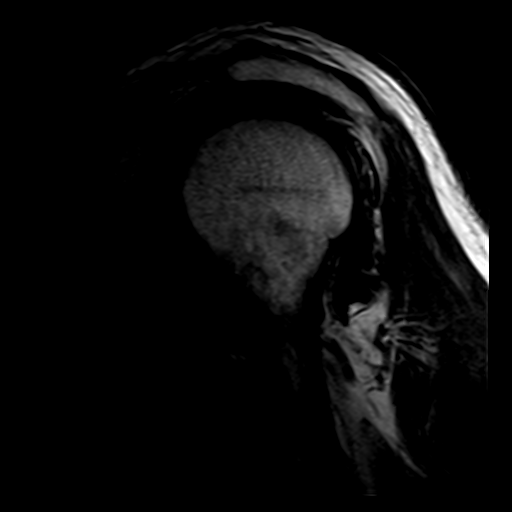
[im 17/23]
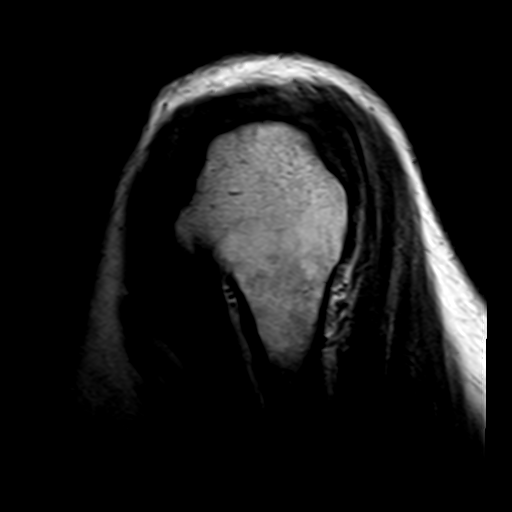
[im 23/23]
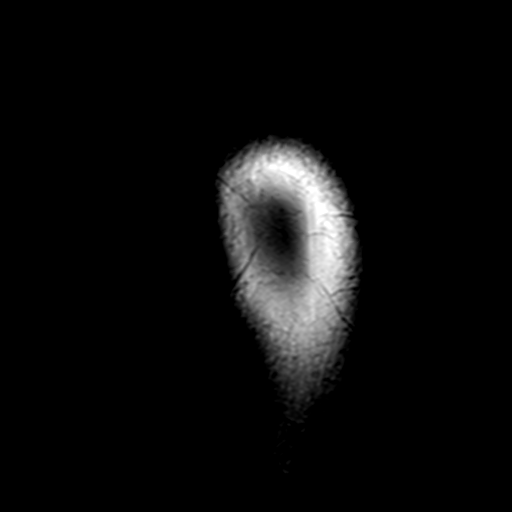

[40 of 40 positions shown; findings below may reference images not displayed]

FINDINGS: Rotator cuff: Intact with supraspinatus and infraspinatus
tendinopathy noted.

Muscles:  No atrophy or focal lesion.

Biceps long head:  Intact.

Acromioclavicular Joint: Mild to moderate osteoarthritis. Type 2
acromion. There is some subacromial spurring. A moderate volume of
fluid is present in the subacromial/subdeltoid bursa.

Glenohumeral Joint: Mild osteoarthritis is present with cartilage
thinning and a few tiny subchondral cysts.

Labrum:  Appears intact.

Bones:  No fracture or focal lesion.

Other: None.
IMPRESSION: Supraspinatus and infraspinatus tendinopathy without tear.

Subacromial/subdeltoid fluid consistent with bursitis.

Mild to moderate acromioclavicular and mild glenohumeral
osteoarthritis.

## 2021-03-08 ENCOUNTER — Encounter: Payer: Self-pay | Admitting: Family Medicine

## 2021-03-08 ENCOUNTER — Other Ambulatory Visit: Payer: Self-pay

## 2021-03-08 DIAGNOSIS — M75112 Incomplete rotator cuff tear or rupture of left shoulder, not specified as traumatic: Secondary | ICD-10-CM

## 2021-03-09 ENCOUNTER — Telehealth: Payer: Medicare Other

## 2021-03-09 NOTE — Progress Notes (Deleted)
Chronic Care Management Pharmacy Note  03/09/2021 Name:  Sean Lane MRN:  [id number] DOB:  [birth year]  Summary:  Recommendations/Changes made from today's visit:  Plan:  Subjective: Sean Lane is an 87 y.o. year old male who is a primary patient of Sean Lane, Sean Mars, MD.  The CCM team was consulted for assistance with disease management and care coordination needs.    {CCMTELEPHONEFACETOFACE:21091510} for {CCMINITIALFOLLOWUPCHOICE:21091511} in response to provider referral for pharmacy case management and/or care coordination services.   Consent to Services:  {CCMCONSENTOPTIONS:25074}  Patient Care Team: [name], MD as PCP - General (Family Medicine) [name], MD as PCP - Cardiology (Cardiology) [name], MD as Consulting Physician (Orthopedic Surgery) [name], MD as Consulting Physician (Ophthalmology) [name], DO as Consulting Physician (Family Medicine) [name], DPM as Consulting Physician (Podiatry) [facility] as Consulting Physician (Audiology) [name], [facility] as Pharmacist (Pharmacist) [name], PA-C as Physician Assistant (Dermatology)  Recent office visits: ***  Recent consult visits: [facility] visits: {Hospital DC Yes/No:21091515}  Objective:  Lab Results  Component Value Date   CREATININE 0.88 02/23/2021   CREATININE 0.96 10/11/2020   CREATININE 0.92 12/30/2019    Lab Results  Component Value Date   HGBA1C 6.4 10/11/2020   Last diabetic Eye exam:  Lab Results  Component Value Date/Time   HMDIABEYEEXA No Retinopathy 11/09/2020 12:00 AM    Last diabetic Foot exam: No results found for: HMDIABFOOTEX      Component Value Date/Time   CHOL 113 12/30/2019 1447   TRIG 161.0 (H) 12/30/2019 1447   HDL 37.40 (L) 12/30/2019 1447   CHOLHDL 3 12/30/2019 1447   VLDL 32.2 12/30/2019 1447   LDLCALC 43 12/30/2019 1447   LDLDIRECT 54.0 06/05/2017 1202    Hepatic Function  Latest Ref Rng & Units 02/23/2021 10/11/2020 12/30/2019  Total Protein 6.0 - 8.3 g/dL 6.8 7.0 6.5  Albumin 3.5 - 5.2 g/dL 4.3 4.4 4.4  AST 0 - 37 U/L '15 16 13  ' ALT 0 - 53 U/L '13 9 10  ' Alk Phosphatase 39 - 117 U/L 99 77 84  Total Bilirubin 0.2 - 1.2 mg/dL 0.5 0.5 0.5  Bilirubin, Direct 0.0 - 0.3 mg/dL - - -    Lab Results  Component Value Date/Time   TSH 1.18 02/23/2021 02:59 PM   TSH 0.99 12/30/2019 02:47 PM    CBC Latest Ref Rng & Units 02/23/2021 11/15/2020 10/11/2020  WBC 4.0 - 10.5 K/uL 5.0 5.3 5.5  Hemoglobin 13.0 - 17.0 g/dL 12.7(L) 13.2 12.9(L)  Hematocrit 39.0 - 52.0 % 38.2(L) 39.3 38.9(L)  Platelets 150.0 - 400.0 K/uL 234.0 220.0 194.0    No results found for: VD25OH  Clinical ASCVD: {YES/NO:21197} The ASCVD Risk score Sean Bussing DC Jr., et al., 2013) failed to calculate for the following reasons:   The 2013 ASCVD risk score is only valid for ages 40 to 24    Other: (CHADS2VASc if Afib, PHQ9 if depression, MMRC or CAT for COPD, ACT, DEXA)  Social History   Tobacco Use  Smoking Status Former   Pack years: 0.00   Types: Cigarettes   Quit date: 09/11/1961   Years since quitting: 59.5  Smokeless Tobacco Never   BP Readings from Last 3 Encounters:  02/23/21 126/70  01/31/21 122/66  01/12/21 (!) 144/78   Pulse Readings from Last 3 Encounters:  02/23/21 81  01/31/21 69  01/12/21 84   Wt Readings from Last 3 Encounters:  02/23/21 177 lb (  80.3 kg)  01/31/21 179 lb (81.2 kg)  01/12/21 180 lb (81.6 kg)    Assessment: Review of patient past medical history, allergies, medications, health status, including review of consultants reports, laboratory and other test data, was performed as part of comprehensive evaluation and provision of chronic care management services.   SDOH:  (Social Determinants of Health) assessments and interventions performed:    CCM Care Plan  Allergies  Allergen Reactions   Hydromorphone Hcl Other (See Comments)     very agitated. With dilaudid    Ketoconazole Rash    Rash on feet with use   Sulfonamide Derivatives Swelling and Rash    Medications Reviewed Today     Reviewed by [name], DO (Physician) on 02/23/21 at [place] [facility] Status: <None>   Medication Order Taking? Sig Documenting Provider Last Dose Status Informant  acetaminophen (TYLENOL) 650 MG CR tablet [id number] Yes Take 1,300 mg by mouth every 8 (eight) hours as needed for pain. [provider] Taking Active Spouse/Significant Other  aspirin EC 81 MG tablet [id number] Yes Take 81 mg by mouth daily. [provider] Taking Active   CREAM BASE EX [id number] Yes Apply 1 application topically at bedtime. Diabetics' Dry Skin Relief (applied to feet) [provider] Taking Active Spouse/Significant Other  dutasteride (AVODART) 0.5 MG capsule [id number] Yes TAKE 1 CAPSULE DAILY [name], MD Taking Active   escitalopram (LEXAPRO) 10 MG tablet [id number] Yes Take 1 tablet (10 mg total) by mouth daily. [name], DO  Active   FREESTYLE INSULINX TEST test strip [id number] Yes USE TO CHECK BLOOD SUGAR DAILY AND AS NEEDED DIAG CODE E11.9 [name], MD Taking Active   Lancets (FREESTYLE) lancets [id number] Yes E11.9 -CHECK BLOOD SUGAR ONCE DAILY [name], MD Taking Active   Naphazoline-Pheniramine [facility] OP) [id number] Yes Apply 1 drop to eye daily as needed (allergies). [provider] Taking Active Spouse/Significant Other  nitroGLYCERIN (NITROSTAT) 0.4 MG SL tablet [id number] Yes Place 1 tablet (0.4 mg total) under the tongue every 5 (five) minutes as needed for chest pain (for chest pain). Use up to 3 dosages, if no relief call 911. [name], MD Taking Active   Omega-3 Fatty Acids (FISH OIL) 1200 MG CAPS [id number] Yes Take 2 capsules by mouth 2 (two) times daily. [provider] Taking Active   rosuvastatin (CRESTOR) 10 MG tablet [id number] Yes TAKE 1 TABLET DAILY [name], MD Taking Active    tamsulosin (FLOMAX) 0.4 MG CAPS capsule [id number] Yes TAKE 1 CAPSULE DAILY [name], MD Taking Active   XARELTO 20 MG TABS tablet [id number] Yes TAKE 1 TABLET DAILY WITH   SUPPER [name], MD Taking Active             Patient Active Problem List   Diagnosis Date Noted   Left rotator cuff tear 12/09/2020   Degenerative disc disease, lumbar 07/21/2020   Nonallopathic lesion of sacral region 12/17/2019   Nonallopathic lesion of lumbosacral region 12/17/2019   Spasm of left piriformis muscle 06/27/2019   Degenerative disc disease, cervical 05/28/2019   Nonallopathic lesion of thoracic region 05/28/2019   Thrombocytopenia ([place]) 12/04/2016   Abnormal nuclear stress test 10/26/2016   Preoperative cardiovascular examination 10/26/2016   CAD S/P percutaneous coronary angioplasty 10/26/2016   Diverticulitis of colon 11/22/2015   Nephrolithiasis 05/07/2014   Gout attack 12/14/2012   Type 2 diabetes mellitus with CAD(HCC) 12/14/2012   Hematuria 03/28/2012   Atrial  flutter ([place]) 10/03/2011   SINUS BRADYCARDIA 07/20/2010   BPH associated with nocturia 06/08/2009   RBBB 12/28/2008   GERD 12/15/2008   DIZZINESS 12/15/2008   CAD (coronary artery disease) 12/17/2007   LOSS, CONDUCTIVE HEARING, COMBINED TYPE 04/16/2007   Hyperlipidemia 04/02/2007   Essential hypertension 04/02/2007   Allergic rhinitis 04/02/2007   OA (osteoarthritis) of knee 04/02/2007    Immunization History  Administered Date(s) Administered   Fluad Quad(high Dose 65+) 05/24/2019   Influenza Split 05/28/2012   Influenza Whole 06/08/2009   Influenza, High Dose Seasonal PF 06/27/2013, 06/06/2016, 06/05/2017, 06/07/2018   Influenza,inj,Quad PF,6+ Mos 05/07/2014, 05/24/2015   PFIZER(Purple Top)SARS-COV-2 Vaccination 10/04/2019, 10/25/2019, 06/15/2020   Pneumococcal Conjugate-13 11/03/2013, 05/07/2014   Pneumococcal Polysaccharide-23 05/24/2015   Pneumococcal-Unspecified 06/20/1982   Td 09/11/2002    Tdap 10/25/2011    Conditions to be addressed/monitored: {CCM ASSESSMENT DISEASE OPTIONS:25047}  There are no care plans that you recently modified to display for this patient.   Medication Assistance: {MEDASSISTANCEINFO:25044}  Patient's preferred pharmacy is:  [facility], [place] [facility] [address] [place] [address] Phone: [phone] Fax: [phone]  Uses pill box? {Yes or If no, why not?:20788} Pt endorses ***% compliance  Follow Up:  {FOLLOWUP:24991}  Plan: {CM FOLLOW UP PLAN:25073}  SIG***

## 2021-03-21 ENCOUNTER — Other Ambulatory Visit: Payer: Self-pay

## 2021-03-21 ENCOUNTER — Other Ambulatory Visit (INDEPENDENT_AMBULATORY_CARE_PROVIDER_SITE_OTHER): Payer: Medicare Other

## 2021-03-21 DIAGNOSIS — R972 Elevated prostate specific antigen [PSA]: Secondary | ICD-10-CM | POA: Diagnosis not present

## 2021-03-21 LAB — PSA: PSA: 4.52 ng/mL — ABNORMAL HIGH (ref 0.10–4.00)

## 2021-03-22 ENCOUNTER — Ambulatory Visit (INDEPENDENT_AMBULATORY_CARE_PROVIDER_SITE_OTHER): Payer: Medicare Other | Admitting: Podiatry

## 2021-03-22 ENCOUNTER — Encounter: Payer: Self-pay | Admitting: Podiatry

## 2021-03-22 ENCOUNTER — Other Ambulatory Visit: Payer: Self-pay

## 2021-03-22 DIAGNOSIS — B351 Tinea unguium: Secondary | ICD-10-CM | POA: Diagnosis not present

## 2021-03-22 DIAGNOSIS — M79675 Pain in left toe(s): Secondary | ICD-10-CM | POA: Diagnosis not present

## 2021-03-22 DIAGNOSIS — M79674 Pain in right toe(s): Secondary | ICD-10-CM | POA: Diagnosis not present

## 2021-03-22 DIAGNOSIS — D689 Coagulation defect, unspecified: Secondary | ICD-10-CM | POA: Diagnosis not present

## 2021-03-22 DIAGNOSIS — R972 Elevated prostate specific antigen [PSA]: Secondary | ICD-10-CM

## 2021-03-22 DIAGNOSIS — E1151 Type 2 diabetes mellitus with diabetic peripheral angiopathy without gangrene: Secondary | ICD-10-CM

## 2021-03-22 NOTE — Progress Notes (Signed)
This patient returns to my office for at risk foot care.  This patient requires this care by a professional since this patient will be at risk due to having type 2 diabetes and coagulation defect.  Patient is taking xarelto.  This patient presents to the office with his wife.  This patient is unable to cut nails himself since the patient cannot reach his nails.These nails are painful walking and wearing shoes.  This patient presents for at risk foot care today.  General Appearance  Alert, conversant and in no acute stress.  Vascular  Dorsalis pedis   pulses are palpable  bilaterally. Posterior tibial pulses are absent  Bilaterally. Capillary return is within normal limits  bilaterally. Temperature is within normal limits  bilaterally.  Neurologic  Senn-Weinstein monofilament wire test diminished   bilaterally. Muscle power within normal limits bilaterally.  Nails Thick disfigured discolored nails with subungual debris  from hallux to fifth toes bilaterally. Hammer toes 2-4  B/L.  Orthopedic  No limitations of motion  feet .  No crepitus or effusions noted.  No bony pathology or digital deformities noted.  Skin  normotropic skin with no porokeratosis noted bilaterally.  No signs of infections or ulcers noted.     Onychomycosis  Pain in right toes  Pain in left toes  Consent was obtained for treatment procedures.   Mechanical debridement of nails 1-5  bilaterally performed with a nail nipper.  Filed with dremel without incident.    Return office visit   10 weeks                 Told patient to return for periodic foot care and evaluation due to potential at risk complications.   Elliemae Braman DPM  

## 2021-03-23 ENCOUNTER — Telehealth: Payer: Self-pay

## 2021-03-23 DIAGNOSIS — M67912 Unspecified disorder of synovium and tendon, left shoulder: Secondary | ICD-10-CM | POA: Diagnosis not present

## 2021-03-23 DIAGNOSIS — M75111 Incomplete rotator cuff tear or rupture of right shoulder, not specified as traumatic: Secondary | ICD-10-CM | POA: Diagnosis not present

## 2021-03-23 NOTE — Chronic Care Management (AMB) (Signed)
Chronic Care Management Pharmacy Assistant   Name: HAITHAM DOLINSKY  MRN: [id number] DOB: 1928/07/11  Reason for Encounter: Disease State - Hypertension Adherence Call    Recent office visits:  None  Recent consult visits:  12/01/2020 [place], [name], MD; no medication changes indicated.  12/09/2020 [place], [name], MD; no medication changes indicated.  12/14/2020 [facility], [name], DPM; no medication changes indicated.  01/12/2021 [place], [name], MD; no medication changes indicated.  01/31/2021 [facility] Cardiology, [name], MD; no medication changes indicated.  01/12/2021 [place], [name], MD; no medication changes indicated.  03/22/2021 [facility], [name], DPM; no medication changes indicated.   Hospital visits:  None in previous 6 months  Medications: Outpatient Encounter Medications as of 03/23/2021  Medication Sig   acetaminophen (TYLENOL) 650 MG CR tablet Take 1,300 mg by mouth every 8 (eight) hours as needed for pain.   aspirin EC 81 MG tablet Take 81 mg by mouth daily.   CREAM BASE EX Apply 1 application topically at bedtime. Diabetics' Dry Skin Relief (applied to feet)   dutasteride (AVODART) 0.5 MG capsule TAKE 1 CAPSULE DAILY   escitalopram (LEXAPRO) 10 MG tablet Take 1 tablet (10 mg total) by mouth daily.   FREESTYLE INSULINX TEST test strip USE TO CHECK BLOOD SUGAR DAILY AND AS NEEDED DIAG CODE E11.9   Lancets (FREESTYLE) lancets E11.9 -CHECK BLOOD SUGAR ONCE DAILY   Naphazoline-Pheniramine (OPCON-A OP) Apply 1 drop to eye daily as needed (allergies).   nitroGLYCERIN (NITROSTAT) 0.4 MG SL tablet Place 1 tablet (0.4 mg total) under the tongue every 5 (five) minutes as needed for chest pain (for chest pain). Use up to 3 dosages, if no relief call 911.   Omega-3 Fatty Acids (FISH OIL) 1200 MG CAPS Take 2 capsules by mouth 2 (two) times daily.   rosuvastatin  (CRESTOR) 10 MG tablet TAKE 1 TABLET DAILY   tamsulosin (FLOMAX) 0.4 MG CAPS capsule TAKE 1 CAPSULE DAILY   XARELTO 20 MG TABS tablet TAKE 1 TABLET DAILY WITH   SUPPER   No facility-administered encounter medications on file as of 03/23/2021.    Reviewed chart prior to disease state call. Spoke with patient regarding BP  Recent Office Vitals: BP Readings from Last 3 Encounters:  02/23/21 126/70  01/31/21 122/66  01/12/21 (!) 144/78   Pulse Readings from Last 3 Encounters:  02/23/21 81  01/31/21 69  01/12/21 84    Wt Readings from Last 3 Encounters:  02/23/21 177 lb (80.3 kg)  01/31/21 179 lb (81.2 kg)  01/12/21 180 lb (81.6 kg)     Kidney Function Lab Results  Component Value Date/Time   CREATININE 0.88 02/23/2021 02:59 PM   CREATININE 0.96 10/11/2020 02:38 PM   GFR 75.62 02/23/2021 02:59 PM   GFRNONAA 84 08/27/2017 02:04 PM   GFRAA 97 08/27/2017 02:04 PM    BMP Latest Ref Rng & Units 02/23/2021 02/23/2021 10/11/2020  Glucose 70 - 99 mg/dL - 151(H) 141(H)  BUN 6 - 23 mg/dL - 23 17  Creatinine 0.40 - 1.50 mg/dL - 0.88 0.96  BUN/Creat Ratio 10 - 24 - - -  Sodium 135 - 145 mEq/L - 140 137  Potassium 3.5 - 5.1 mEq/L - 4.3 4.3  Chloride 96 - 112 mEq/L - 102 104  CO2 19 - 32 mEq/L - 30 27  Calcium 8.6 - 10.3 mg/dL 10.3 10.3 10.4  Current antihypertensive regimen:  None  How often are you checking your Blood Pressure? infrequently  Current home BP readings: Patient wife states the patient has been monitoring his blood pressure occasionally. She states she does not remember the exact readings, she states they have all been within normal range.  What recent interventions/DTPs have been made by any provider to improve Blood Pressure control since last CPP Visit: None  Any recent hospitalizations or ED visits since last visit with CPP? No  What diet changes have been made to improve Blood Pressure Control?  Patients wife states the patient does not follow any diet. She  states the patient has lost 60 lbs since being diagnosed with diabetes.  What exercise is being done to improve your Blood Pressure Control?  Patients wife states the patient is not able to exercise at this time due to weakness and rotator cuff problems.  Adherence Review: Is the patient currently on ACE/ARB medication? Yes Does the patient have >5 day gap between last estimated fill dates? No  Future Appointments  Date Time Provider [place]  03/29/2021  1:00 PM [facility] CCM PHARMACIST [facility]  04/06/2021  1:45 PM [name], DO [facility] None  04/11/2021  1:40 PM [name], MD [facility]  06/06/2021  1:15 PM [name], DPM [facility]  09/20/2021  2:00 PM [name], PA-C [facility]     Star Rating Drugs: Losartan Potassium 25 mg last filled 08/22/2020 90 DS Rosuvastatin 10 mg last filled 03/06/2021 90 DS   April D Cal[place], [place] Pharmacist Assistant [phone]

## 2021-03-29 ENCOUNTER — Ambulatory Visit (INDEPENDENT_AMBULATORY_CARE_PROVIDER_SITE_OTHER): Payer: Medicare Other | Admitting: Pharmacist

## 2021-03-29 DIAGNOSIS — E1159 Type 2 diabetes mellitus with other circulatory complications: Secondary | ICD-10-CM

## 2021-03-29 DIAGNOSIS — I4892 Unspecified atrial flutter: Secondary | ICD-10-CM

## 2021-03-29 NOTE — Progress Notes (Signed)
Chronic Care Management Pharmacy Note  03/30/2021 Name:  Sean Lane MRN:  [id number] DOB:  10-08-1927  Summary: PharmD follow up.  Patient has been getting injections for pain in shoulders.  This has caused some sugar spikes, however they are on the way down.  Pain relief with these was excellent.  No other med concerns.  Recommendations/Changes made from today's visit: Patient requested refill on Lexapro, he took is last pill.  Plan: FU 6 months   Subjective: Sean Lane is an 87 y.o. year old male who is a primary patient of Hunter, Brayton Mars, MD.  The CCM team was consulted for assistance with disease management and care coordination needs.    Engaged with patient by telephone for follow up visit in response to provider referral for pharmacy case management and/or care coordination services.   Consent to Services:  The patient was given the following information about Chronic Care Management services today, agreed to services, and gave verbal consent: 1. CCM service includes personalized support from designated clinical staff supervised by the primary care provider, including individualized plan of care and coordination with other care providers 2. 24/7 contact phone numbers for assistance for urgent and routine care needs. 3. Service will only be billed when office clinical staff spend 20 minutes or more in a month to coordinate care. 4. Only one practitioner may furnish and bill the service in a calendar month. 5.The patient may stop CCM services at any time (effective at the end of the month) by phone call to the office staff. 6. The patient will be responsible for cost sharing (co-pay) of up to 20% of the service fee (after annual deductible is met). Patient agreed to services and consent obtained.  Patient Care Team: [name], MD as PCP - General (Family Medicine) [name], MD as PCP - Cardiology (Cardiology) [name], MD as Consulting  Physician (Orthopedic Surgery) [name], MD as Consulting Physician (Ophthalmology) [name], DO as Consulting Physician (Family Medicine) [name], DPM as Consulting Physician (Podiatry) [facility] as Consulting Physician (Audiology) [name], [facility] as Pharmacist (Pharmacist) Starlyn Skeans as Physician Assistant (Dermatology)   Recent office visits:  None  Recent consult visits:  12/01/2020 [place], [name], MD; no medication changes indicated.   12/09/2020 [place], [name], MD; no medication changes indicated.   12/14/2020 [facility], [name], DPM; no medication changes indicated.   01/12/2021 [place], [name], MD; no medication changes indicated.   01/31/2021 [facility], [name], MD; no medication changes indicated.   01/12/2021 [place], [name], MD; no medication changes indicated.   03/22/2021 [facility], [name], DPM; no medication changes indicated.     Hospital visits:  None in previous 6 months   Objective:  Lab Results  Component Value Date   CREATININE 0.88 02/23/2021   BUN 23 02/23/2021   GFR 75.62 02/23/2021   GFRNONAA 84 08/27/2017   GFRAA 97 08/27/2017   NA 140 02/23/2021   K 4.3 02/23/2021   CALCIUM 10.3 02/23/2021   CALCIUM 10.3 02/23/2021   CO2 30 02/23/2021   GLUCOSE 151 (H) 02/23/2021    Lab Results  Component Value Date/Time   HGBA1C 6.4 10/11/2020 02:38 PM   HGBA1C 5.8 (A) 04/08/2020 03:35 PM   HGBA1C 5.9 12/30/2019 02:47 PM   GFR 75.62 02/23/2021 02:59 PM   GFR 69.69 10/11/2020 02:38 PM  MICROALBUR 3.0 (H) 11/22/2015 03:26 PM   MICROALBUR <0.7 11/10/2014 12:25 PM    Last diabetic Eye exam:  Lab Results  Component Value Date/Time   HMDIABEYEEXA No Retinopathy 11/09/2020 12:00 AM    Last diabetic Foot exam: No results found for: HMDIABFOOTEX   Lab Results  Component Value Date   CHOL 113  12/30/2019   HDL 37.40 (L) 12/30/2019   LDLCALC 43 12/30/2019   LDLDIRECT 54.0 06/05/2017   TRIG 161.0 (H) 12/30/2019   CHOLHDL 3 12/30/2019    Hepatic Function Latest Ref Rng & Units 02/23/2021 10/11/2020 12/30/2019  Total Protein 6.0 - 8.3 g/dL 6.8 7.0 6.5  Albumin 3.5 - 5.2 g/dL 4.3 4.4 4.4  AST 0 - 37 U/L '15 16 13  ' ALT 0 - 53 U/L '13 9 10  ' Alk Phosphatase 39 - 117 U/L 99 77 84  Total Bilirubin 0.2 - 1.2 mg/dL 0.5 0.5 0.5  Bilirubin, Direct 0.0 - 0.3 mg/dL - - -    Lab Results  Component Value Date/Time   TSH 1.18 02/23/2021 02:59 PM   TSH 0.99 12/30/2019 02:47 PM    CBC Latest Ref Rng & Units 02/23/2021 11/15/2020 10/11/2020  WBC 4.0 - 10.5 K/uL 5.0 5.3 5.5  Hemoglobin 13.0 - 17.0 g/dL 12.7(L) 13.2 12.9(L)  Hematocrit 39.0 - 52.0 % 38.2(L) 39.3 38.9(L)  Platelets 150.0 - 400.0 K/uL 234.0 220.0 194.0    No results found for: VD25OH  Clinical ASCVD: No  The ASCVD Risk score [name] DC Jr., et al., 2013) failed to calculate for the following reasons:   The 2013 ASCVD risk score is only valid for ages 84 to 40    Depression screen PHQ 2/9 09/07/2020 12/30/2019 10/21/2019  Decreased Interest 0 0 0  Down, Depressed, Hopeless 0 0 0  PHQ - 2 Score 0 0 0  Altered sleeping - 2 -  Tired, decreased energy - 2 -  Change in appetite - 1 -  Feeling bad or failure about yourself  - 0 -  Trouble concentrating - 0 -  Moving slowly or fidgety/restless - 0 -  Suicidal thoughts - 0 -  PHQ-9 Score - 5 -  Difficult doing work/chores - Somewhat difficult -  Some recent data might be hidden      Social History   Tobacco Use  Smoking Status Former   Types: Cigarettes   Quit date: 09/11/1961   Years since quitting: 59.5  Smokeless Tobacco Never   BP Readings from Last 3 Encounters:  02/23/21 126/70  01/31/21 122/66  01/12/21 (!) 144/78   Pulse Readings from Last 3 Encounters:  02/23/21 81  01/31/21 69  01/12/21 84   Wt Readings from Last 3 Encounters:  02/23/21 177 lb (80.3 kg)   01/31/21 179 lb (81.2 kg)  01/12/21 180 lb (81.6 kg)   BMI Readings from Last 3 Encounters:  02/23/21 23.35 kg/m  01/31/21 23.62 kg/m  01/12/21 23.75 kg/m    Assessment/Interventions: Review of patient past medical history, allergies, medications, health status, including review of consultants reports, laboratory and other test data, was performed as part of comprehensive evaluation and provision of chronic care management services.   SDOH:  (Social Determinants of Health) assessments and interventions performed: Yes  Financial Resource Strain: Not on file    SDOH Screenings   Alcohol Screen: Not on file  Depression (PHQ2-9): Low Risk    PHQ-2 Score: 0  Financial Resource Strain: Not on file  Food Insecurity: No Food Insecurity   Worried  About Running Out of Food in the Last Year: Never true   Ran Out of Food in the Last Year: Never true  Housing: Not on file  Physical Activity: Not on file  Social Connections: Not on file  Stress: Not on file  Tobacco Use: Medium Risk   Smoking Tobacco Use: Former   Smokeless Tobacco Use: Never  Transportation Needs: No Transportation Needs   Lack of Transportation (Medical): No   Lack of Transportation (Non-Medical): No    CCM Care Plan  Allergies  Allergen Reactions   Hydromorphone Hcl Other (See Comments)     very agitated. With dilaudid   Ketoconazole Rash    Rash on feet with use   Sulfonamide Derivatives Swelling and Rash    Medications Reviewed Today     Reviewed by [name], DPM (Physician) on 03/22/21 at [place] List Status: <None>   Medication Order Taking? Sig Documenting Provider Last Dose Status Informant  acetaminophen (TYLENOL) 650 MG CR tablet [id number] No Take 1,300 mg by mouth every 8 (eight) hours as needed for pain. [provider] Taking Active Spouse/Significant Other  aspirin EC 81 MG tablet [id number] No Take 81 mg by mouth daily. [provider] Taking Active   CREAM BASE  EX [id number] No Apply 1 application topically at bedtime. Diabetics' Dry Skin Relief (applied to feet) [provider] Taking Active Spouse/Significant Other  dutasteride (AVODART) 0.5 MG capsule [id number] No TAKE 1 CAPSULE DAILY [name], MD Taking Active   escitalopram (LEXAPRO) 10 MG tablet [id number]  Take 1 tablet (10 mg total) by mouth daily. [name], DO  Active   FREESTYLE INSULINX TEST test strip [id number] No USE TO CHECK BLOOD SUGAR DAILY AND AS NEEDED DIAG CODE E11.9 [name], MD Taking Active   Lancets (FREESTYLE) lancets [id number] No E11.9 -CHECK BLOOD SUGAR ONCE DAILY [name], MD Taking Active   Naphazoline-Pheniramine [facility] OP) [id number] No Apply 1 drop to eye daily as needed (allergies). [provider] Taking Active Spouse/Significant Other  nitroGLYCERIN (NITROSTAT) 0.4 MG SL tablet [id number] No Place 1 tablet (0.4 mg total) under the tongue every 5 (five) minutes as needed for chest pain (for chest pain). Use up to 3 dosages, if no relief call 911. [name], MD Taking Active   Omega-3 Fatty Acids (FISH OIL) 1200 MG CAPS [id number] No Take 2 capsules by mouth 2 (two) times daily. [provider] Taking Active   rosuvastatin (CRESTOR) 10 MG tablet [id number]  TAKE 1 TABLET DAILY [name], MD  Active   tamsulosin (FLOMAX) 0.4 MG CAPS capsule [id number] No TAKE 1 CAPSULE DAILY [name], MD Taking Active   XARELTO 20 MG TABS tablet [id number]  TAKE 1 TABLET DAILY WITH   SUPPER [name], MD  Active             Patient Active Problem List   Diagnosis Date Noted   Left rotator cuff tear 12/09/2020   Degenerative disc disease, lumbar 07/21/2020   Nonallopathic lesion of sacral region 12/17/2019   Nonallopathic lesion of lumbosacral region 12/17/2019   Spasm of left piriformis muscle 06/27/2019   Degenerative disc disease, cervical 05/28/2019   Nonallopathic lesion of thoracic region  05/28/2019   Thrombocytopenia ([place]) 12/04/2016   Abnormal nuclear stress test 10/26/2016   Preoperative cardiovascular examination 10/26/2016   CAD S/P percutaneous coronary angioplasty 10/26/2016   Diverticulitis of colon 11/22/2015   Nephrolithiasis 05/07/2014   Gout attack  12/14/2012   Type 2 diabetes mellitus with CAD(HCC) 12/14/2012   Hematuria 03/28/2012   Atrial flutter ([place]) 10/03/2011   SINUS BRADYCARDIA 07/20/2010   BPH associated with nocturia 06/08/2009   RBBB 12/28/2008   GERD 12/15/2008   DIZZINESS 12/15/2008   CAD (coronary artery disease) 12/17/2007   LOSS, CONDUCTIVE HEARING, COMBINED TYPE 04/16/2007   Hyperlipidemia 04/02/2007   Essential hypertension 04/02/2007   Allergic rhinitis 04/02/2007   OA (osteoarthritis) of knee 04/02/2007    Immunization History  Administered Date(s) Administered   Fluad Quad(high Dose 65+) 05/24/2019   Influenza Split 05/28/2012   Influenza Whole 06/08/2009   Influenza, High Dose Seasonal PF 06/27/2013, 06/06/2016, 06/05/2017, 06/07/2018   Influenza,inj,Quad PF,6+ Mos 05/07/2014, 05/24/2015   PFIZER(Purple Top)SARS-COV-2 Vaccination 10/04/2019, 10/25/2019, 06/15/2020   Pneumococcal Conjugate-13 11/03/2013, 05/07/2014   Pneumococcal Polysaccharide-23 05/24/2015   Pneumococcal-Unspecified 06/20/1982   Td 09/11/2002   Tdap 10/25/2011    Conditions to be addressed/monitored:  DM, Afib  Care Plan : General Pharmacy (Adult)  Updates made by Edythe Clarity, RPH since 03/30/2021 12:00 AM     Problem: Dm, Afib   Priority: High  Onset Date: 03/30/2021     Long-Range Goal: Patient-Specific Goal   Start Date: 03/29/2021  Expected End Date: 09/30/2021  This Visit's Progress: On track  Priority: High  Note:   Current Barriers:  None identified at this time  Pharmacist Clinical Goal(s):  Patient will verbalize ability to afford treatment regimen achieve adherence to monitoring guidelines and medication adherence to achieve  therapeutic efficacy contact provider office for questions/concerns as evidenced notation of same in electronic health record through collaboration with PharmD and provider.   Interventions: 1:1 collaboration with [name], MD regarding development and update of comprehensive plan of care as evidenced by provider attestation and co-signature Inter-disciplinary care team collaboration (see longitudinal plan of care) Comprehensive medication review performed; medication list updated in electronic medical record  Diabetes (A1c goal <7%) -Controlled -Current medications: None -Medications previously tried: metformin  -Current home glucose readings fasting glucose: not monitoring post prandial glucose: not monitoring -Denies hypoglycemic/hyperglycemic symptoms  -Educated on A1c and blood sugar goals; Complications of diabetes including kidney damage, retinal damage, and cardiovascular disease; Exercise goal of 150 minutes per week; -Counseled to check feet daily and get yearly eye exams -Recommended continue current management -Patient reports some injections that have helped pain in shoulders all the way through the arms, they did spike sugars up in the 200s, however they have since started to come down. Suggested they continue to watch  and that the short spikes are OK, especially if quality of life for patient with pain is improving.  Atrial Fibrillation (Goal: prevent stroke and major bleeding) -Controlled -CHADSVASC: 5 -Current treatment: Rate control: None Anticoagulation: Xarelto 68m daily  -Medications previously tried: Carvedilol -Denies any abnormal bleeding -Counseled on bleeding risk associated with Xarelto and importance of self-monitoring for signs/symptoms of bleeding; avoidance of NSAIDs due to increased bleeding risk with anticoagulants; -Recommended to continue current medication Assessed patient finances. They never sent in application for Xarelto PAP, due  to the amount of information it requires.  Currently ok with copay and no lapses in med administration.  Medication Management  -Patient was out of Lexapro, sent refill request to Dr. ZGenevie Annnurse for refill.  Do not want patient to have to go without.  [facility] so they do not have to wait on mail order delivery.   Patient Goals/Self-Care Activities Patient will:  -  take medications as prescribed focus on medication adherence by pill box check glucose weekly, document, and provide at future appointments  Follow Up Plan: The care management team will reach out to the patient again over the next 180 days.        Medication Assistance: None required.  Patient affirms current coverage meets needs.  Compliance/Adherence/Medication fill history: Care Gaps: Urine Microalbumin  Star-Rating Drugs: Rosuvastatin 71m 03/06/21 90ds  [facility] [place] [id number]: [phone]: [phone] Uses pill box? Yes Pt endorses 100% compliance  We discussed: Benefits of medication synchronization, packaging and delivery as well as enhanced pharmacist oversight with [employer]. Patient decided to: Continue current medication management strategy  Care Plan and Follow Up Patient Decision:  Patient agrees to Care Plan and Follow-up.  Plan: The care management team will reach out to the patient again over the next 180 days.  CBeverly Milch PharmD Clinical Pharmacist [phone]

## 2021-03-30 ENCOUNTER — Telehealth: Payer: Self-pay | Admitting: Family Medicine

## 2021-03-30 ENCOUNTER — Other Ambulatory Visit: Payer: Self-pay

## 2021-03-30 DIAGNOSIS — S46002D Unspecified injury of muscle(s) and tendon(s) of the rotator cuff of left shoulder, subsequent encounter: Secondary | ICD-10-CM | POA: Diagnosis not present

## 2021-03-30 DIAGNOSIS — M75111 Incomplete rotator cuff tear or rupture of right shoulder, not specified as traumatic: Secondary | ICD-10-CM | POA: Diagnosis not present

## 2021-03-30 DIAGNOSIS — M6281 Muscle weakness (generalized): Secondary | ICD-10-CM | POA: Diagnosis not present

## 2021-03-30 MED ORDER — ESCITALOPRAM OXALATE 10 MG PO TABS: 10.0000 mg | ORAL_TABLET | Freq: Every day | ORAL | 0 refills | Status: DC

## 2021-03-30 NOTE — Telephone Encounter (Signed)
Pt had visit with PCP's Pharmacist yesterday and discovered he is OUT of the Lexapro and has not taken in several days.  Can we send 1 month refill ( not to mail order on file ) [facility] [address]?  Please notify wife/[name].

## 2021-03-30 NOTE — Patient Instructions (Addendum)
Visit Information   Goals Addressed             This Visit's Progress    Manage My Medicine       Timeframe:  Long-Range Goal Priority:  High Start Date: 03/29/21                             Expected End Date:  09/3021                     Follow Up Date 07/11/21    Ensure no gaps in medication ie Lexapro!    Why is this important?   These steps will help you keep on track with your medicines.   Notes:        Patient Care Plan: General Pharmacy (Adult)     Problem Identified: Dm, Afib   Priority: High  Onset Date: 03/30/2021     Long-Range Goal: Patient-Specific Goal   Start Date: 03/29/2021  Expected End Date: 09/30/2021  This Visit's Progress: On track  Priority: High  Note:   Current Barriers:  None identified at this time  Pharmacist Clinical Goal(s):  Patient will verbalize ability to afford treatment regimen achieve adherence to monitoring guidelines and medication adherence to achieve therapeutic efficacy contact provider office for questions/concerns as evidenced notation of same in electronic health record through collaboration with PharmD and provider.   Interventions: 1:1 collaboration with [name], MD regarding development and update of comprehensive plan of care as evidenced by provider attestation and co-signature Inter-disciplinary care team collaboration (see longitudinal plan of care) Comprehensive medication review performed; medication list updated in electronic medical record  Diabetes (A1c goal <7%) -Controlled -Current medications: None -Medications previously tried: metformin  -Current home glucose readings fasting glucose: not monitoring post prandial glucose: not monitoring -Denies hypoglycemic/hyperglycemic symptoms  -Educated on A1c and blood sugar goals; Complications of diabetes including kidney damage, retinal damage, and cardiovascular disease; Exercise goal of 150 minutes per week; -Counseled to check feet daily  and get yearly eye exams -Recommended continue current management -Patient reports some injections that have helped pain in shoulders all the way through the arms, they did spike sugars up in the 200s, however they have since started to come down. Suggested they continue to watch  and that the short spikes are OK, especially if quality of life for patient with pain is improving.  Atrial Fibrillation (Goal: prevent stroke and major bleeding) -Controlled -CHADSVASC: 5 -Current treatment: Rate control: None Anticoagulation: Xarelto 20mg  daily  -Medications previously tried: Carvedilol -Denies any abnormal bleeding -Counseled on bleeding risk associated with Xarelto and importance of self-monitoring for signs/symptoms of bleeding; avoidance of NSAIDs due to increased bleeding risk with anticoagulants; -Recommended to continue current medication Assessed patient finances. They never sent in application for Xarelto PAP, due to the amount of information it requires.  Currently ok with copay and no lapses in med administration.  Medication Management  -Patient was out of Lexapro, sent refill request to Dr. Genevie Ann nurse for refill.  Do not want patient to have to go without.  [facility] so [facility] not have to wait on mail order delivery.   Patient Goals/Self-Care Activities Patient will:  - take medications as prescribed focus on medication adherence by pill box check glucose weekly, document, and provide at future appointments  Follow Up Plan: The care management team will reach out to the patient again over  the next 180 days.        Patient verbalizes understanding of instructions provided today and agrees to view in [place].  Telephone follow up appointment with pharmacy team member scheduled for: 6 months  Edythe Clarity, [place]

## 2021-03-30 NOTE — Telephone Encounter (Signed)
Called medication in earlier today after speaking with [name]. Left message on VM that medication was called in.

## 2021-04-05 ENCOUNTER — Encounter: Payer: Self-pay | Admitting: Family Medicine

## 2021-04-05 DIAGNOSIS — S46002D Unspecified injury of muscle(s) and tendon(s) of the rotator cuff of left shoulder, subsequent encounter: Secondary | ICD-10-CM | POA: Diagnosis not present

## 2021-04-05 DIAGNOSIS — M75111 Incomplete rotator cuff tear or rupture of right shoulder, not specified as traumatic: Secondary | ICD-10-CM | POA: Diagnosis not present

## 2021-04-05 DIAGNOSIS — M6281 Muscle weakness (generalized): Secondary | ICD-10-CM | POA: Diagnosis not present
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
## 2021-04-06 ENCOUNTER — Encounter: Payer: Self-pay | Admitting: Family Medicine

## 2021-04-06 ENCOUNTER — Other Ambulatory Visit: Payer: Self-pay

## 2021-04-06 ENCOUNTER — Ambulatory Visit (INDEPENDENT_AMBULATORY_CARE_PROVIDER_SITE_OTHER): Payer: Medicare Other | Admitting: Family Medicine

## 2021-04-06 VITALS — BP 112/78 | HR 85 | Ht 73.0 in | Wt 178.0 lb

## 2021-04-06 DIAGNOSIS — M9903 Segmental and somatic dysfunction of lumbar region: Secondary | ICD-10-CM | POA: Diagnosis not present

## 2021-04-06 DIAGNOSIS — M9904 Segmental and somatic dysfunction of sacral region: Secondary | ICD-10-CM

## 2021-04-06 DIAGNOSIS — M5030 Other cervical disc degeneration, unspecified cervical region: Secondary | ICD-10-CM | POA: Diagnosis not present

## 2021-04-06 DIAGNOSIS — M9902 Segmental and somatic dysfunction of thoracic region: Secondary | ICD-10-CM

## 2021-04-06 DIAGNOSIS — I2510 Atherosclerotic heart disease of native coronary artery without angina pectoris: Secondary | ICD-10-CM | POA: Diagnosis not present

## 2021-04-06 DIAGNOSIS — M9901 Segmental and somatic dysfunction of cervical region: Secondary | ICD-10-CM

## 2021-04-06 DIAGNOSIS — M75112 Incomplete rotator cuff tear or rupture of left shoulder, not specified as traumatic: Secondary | ICD-10-CM | POA: Diagnosis not present

## 2021-04-06 DIAGNOSIS — R972 Elevated prostate specific antigen [PSA]: Secondary | ICD-10-CM

## 2021-04-06 NOTE — Assessment & Plan Note (Signed)
We will continue to monitor.  Patient does not want any surgical intervention.  Could be a potential candidate for PRP.  Patient will follow up with me again in 6 to 8 weeks.

## 2021-04-06 NOTE — Assessment & Plan Note (Signed)
Chronic problem and stable.  Discussed icing regimen and home exercises, discussed which activities to do which wants to avoid.  Increase activity slowly.  Follow-up again in 6 to 8 weeks

## 2021-04-08 DIAGNOSIS — M75111 Incomplete rotator cuff tear or rupture of right shoulder, not specified as traumatic: Secondary | ICD-10-CM | POA: Diagnosis not present

## 2021-04-08 DIAGNOSIS — S46002D Unspecified injury of muscle(s) and tendon(s) of the rotator cuff of left shoulder, subsequent encounter: Secondary | ICD-10-CM | POA: Diagnosis not present

## 2021-04-08 DIAGNOSIS — M6281 Muscle weakness (generalized): Secondary | ICD-10-CM | POA: Diagnosis not present

## 2021-04-11 ENCOUNTER — Encounter: Payer: Self-pay | Admitting: Family Medicine

## 2021-04-11 ENCOUNTER — Other Ambulatory Visit: Payer: Self-pay

## 2021-04-11 ENCOUNTER — Ambulatory Visit (INDEPENDENT_AMBULATORY_CARE_PROVIDER_SITE_OTHER): Payer: Medicare Other | Admitting: Family Medicine

## 2021-04-11 VITALS — BP 130/66 | HR 76 | Temp 98.3°F | Ht 73.0 in | Wt 177.4 lb

## 2021-04-11 DIAGNOSIS — I2510 Atherosclerotic heart disease of native coronary artery without angina pectoris: Secondary | ICD-10-CM

## 2021-04-11 DIAGNOSIS — R351 Nocturia: Secondary | ICD-10-CM

## 2021-04-11 DIAGNOSIS — I4892 Unspecified atrial flutter: Secondary | ICD-10-CM

## 2021-04-11 DIAGNOSIS — N401 Enlarged prostate with lower urinary tract symptoms: Secondary | ICD-10-CM

## 2021-04-11 DIAGNOSIS — E1159 Type 2 diabetes mellitus with other circulatory complications: Secondary | ICD-10-CM

## 2021-04-11 DIAGNOSIS — I10 Essential (primary) hypertension: Secondary | ICD-10-CM

## 2021-04-11 DIAGNOSIS — E785 Hyperlipidemia, unspecified: Secondary | ICD-10-CM | POA: Diagnosis not present

## 2021-04-11 LAB — COMPREHENSIVE METABOLIC PANEL
ALT: 12 U/L (ref 0–53)
AST: 15 U/L (ref 0–37)
Albumin: 4.2 g/dL (ref 3.5–5.2)
Alkaline Phosphatase: 89 U/L (ref 39–117)
BUN: 19 mg/dL (ref 6–23)
CO2: 27 mEq/L (ref 19–32)
Calcium: 9.9 mg/dL (ref 8.4–10.5)
Chloride: 104 mEq/L (ref 96–112)
Creatinine, Ser: 0.81 mg/dL (ref 0.40–1.50)
GFR: 77.47 mL/min (ref >60.00)
Glucose, Bld: 127 mg/dL — ABNORMAL HIGH (ref 70–99)
Potassium: 4.1 mEq/L (ref 3.5–5.1)
Sodium: 138 mEq/L (ref 135–145)
Total Bilirubin: 0.5 mg/dL (ref 0.2–1.2)
Total Protein: 6.9 g/dL (ref 6.0–8.3)

## 2021-04-11 LAB — LIPID PANEL
Cholesterol: 118 mg/dL (ref 0–200)
HDL: 44.1 mg/dL (ref >39.00)
LDL Cholesterol: 46 mg/dL (ref 0–99)
NonHDL: 73.7
Total CHOL/HDL Ratio: 3
Triglycerides: 141 mg/dL (ref 0.0–149.0)
VLDL: 28.2 mg/dL (ref 0.0–40.0)

## 2021-04-11 LAB — MICROALBUMIN / CREATININE URINE RATIO
Creatinine,U: 102.4 mg/dL
Microalb Creat Ratio: 6.6 mg/g (ref 0.0–30.0)
Microalb, Ur: 6.7 mg/dL — ABNORMAL HIGH (ref 0.0–1.9)

## 2021-04-11 LAB — CBC WITH DIFFERENTIAL/PLATELET
Basophils Absolute: 0 10*3/uL (ref 0.0–0.1)
Basophils Relative: 1 % (ref 0.0–3.0)
Eosinophils Absolute: 0.1 10*3/uL (ref 0.0–0.7)
Eosinophils Relative: 1.6 % (ref 0.0–5.0)
HCT: 38.1 % — ABNORMAL LOW (ref 39.0–52.0)
Hemoglobin: 12.4 g/dL — ABNORMAL LOW (ref 13.0–17.0)
Lymphocytes Relative: 22.7 % (ref 12.0–46.0)
Lymphs Abs: 0.9 10*3/uL (ref 0.7–4.0)
MCHC: 32.6 g/dL (ref 30.0–36.0)
MCV: 86.8 fl (ref 78.0–100.0)
Monocytes Absolute: 0.3 10*3/uL (ref 0.1–1.0)
Monocytes Relative: 7.6 % (ref 3.0–12.0)
Neutro Abs: 2.8 10*3/uL (ref 1.4–7.7)
Neutrophils Relative %: 67.1 % (ref 43.0–77.0)
Platelets: 188 10*3/uL (ref 150.0–400.0)
RBC: 4.39 Mil/uL (ref 4.22–5.81)
RDW: 15.9 % — ABNORMAL HIGH (ref 11.5–15.5)
WBC: 4.2 10*3/uL (ref 4.0–10.5)

## 2021-04-11 LAB — HEMOGLOBIN A1C: Hgb A1c MFr Bld: 6.4 % (ref 4.6–6.5)

## 2021-04-11 NOTE — Patient Instructions (Addendum)
Health Maintenance Due  Topic Date Due   Zoster Vaccines- Shingrix (1 of 2)  Please check with your pharmacy to see if they have the shingrix vaccine. If they do- please get this immunization and update [place] by phone call or mychart with dates you receive the vaccine  Never done   COVID-19 Vaccine (4 - Booster for [employer] series)- reasonable to go ahead and get this 09/15/2020   INFLUENZA VACCINE - ok to wait until October or so- message me when you get this or we should have fall flu clinic 04/11/2021   Please stop by lab before you go If you have mychart- we will send your results within 3 business days of [place] receiving them.  If you do not have mychart- we will call you about results within 5 business days of [place] receiving them.  *please also note that you will see labs on mychart as soon as they post. I will later go in and write notes on them- will say "notes from Dr. Yong Channel"  Recommended follow up: Return in about 6 months (around 10/12/2021) for follow up- or sooner if needed.

## 2021-04-11 NOTE — Progress Notes (Signed)
Phone [phone] In person visit   Subjective:   Sean Lane is a 87 y.o. year old very pleasant male patient who presents for/with See problem oriented charting Chief Complaint  Patient presents with   Hypertension   Diabetes   Hyperlipidemia   Coronary Artery Disease   Atrial Flutter   Bilateral Thumb pain    Starting 2 weeks ago. Tylenol takes the edge off, but still has not subsided.   This visit occurred during the SARS-CoV-2 public health emergency.  Safety protocols were in place, including screening questions prior to the visit, additional usage of staff PPE, and extensive cleaning of exam room while observing appropriate contact time as indicated for disinfecting solutions.   Past Medical History-  Patient Active Problem List   Diagnosis Date Noted   Type 2 diabetes mellitus with CAD(HCC) 12/14/2012    Priority: High   Atrial flutter ([place]) 10/03/2011    Priority: High   CAD (coronary artery disease) 12/17/2007    Priority: High   SINUS BRADYCARDIA 07/20/2010    Priority: Medium   BPH associated with nocturia 06/08/2009    Priority: Medium   DIZZINESS 12/15/2008    Priority: Medium   Hyperlipidemia 04/02/2007    Priority: Medium   Essential hypertension 04/02/2007    Priority: Medium   Nephrolithiasis 05/07/2014    Priority: Low   Gout attack 12/14/2012    Priority: Low   Hematuria 03/28/2012    Priority: Low   RBBB 12/28/2008    Priority: Low   GERD 12/15/2008    Priority: Low   LOSS, CONDUCTIVE HEARING, COMBINED TYPE 04/16/2007    Priority: Low   Allergic rhinitis 04/02/2007    Priority: Low   OA (osteoarthritis) of knee 04/02/2007    Priority: Low   Left rotator cuff tear 12/09/2020   Degenerative disc disease, lumbar 07/21/2020   Nonallopathic lesion of sacral region 12/17/2019   Nonallopathic lesion of lumbosacral region 12/17/2019   Spasm of left piriformis muscle 06/27/2019   Degenerative disc disease, cervical 05/28/2019    Nonallopathic lesion of thoracic region 05/28/2019   Thrombocytopenia ([place]) 12/04/2016   Abnormal nuclear stress test 10/26/2016   Preoperative cardiovascular examination 10/26/2016   CAD S/P percutaneous coronary angioplasty 10/26/2016   Diverticulitis of colon 11/22/2015    Medications- reviewed and updated Current Outpatient Medications  Medication Sig Dispense Refill   acetaminophen (TYLENOL) 650 MG CR tablet Take 1,300 mg by mouth every 8 (eight) hours as needed for pain.     aspirin EC 81 MG tablet Take 81 mg by mouth daily.     cholecalciferol (VITAMIN D3) 25 MCG (1000 UNIT) tablet Take 2,000 Units by mouth daily.     CREAM BASE EX Apply 1 application topically at bedtime. Diabetics' Dry Skin Relief (applied to feet)     dutasteride (AVODART) 0.5 MG capsule TAKE 1 CAPSULE DAILY 90 capsule 3   escitalopram (LEXAPRO) 10 MG tablet Take 1 tablet (10 mg total) by mouth daily. 30 tablet 0   FREESTYLE INSULINX TEST test strip USE TO CHECK BLOOD SUGAR DAILY AND AS NEEDED DIAG CODE E11.9 100 strip 4   Lancets (FREESTYLE) lancets E11.9 -CHECK BLOOD SUGAR ONCE DAILY 100 each 4   Naphazoline-Pheniramine (OPCON-A OP) Apply 1 drop to eye daily as needed (allergies).     nitroGLYCERIN (NITROSTAT) 0.4 MG SL tablet Place 1 tablet (0.4 mg total) under the tongue every 5 (five) minutes as needed for chest pain (for chest pain). Use up to 3 dosages,  if no relief call 911. 75 tablet 1   Omega-3 Fatty Acids (FISH OIL) 1200 MG CAPS Take 2 capsules by mouth 2 (two) times daily.     rosuvastatin (CRESTOR) 10 MG tablet TAKE 1 TABLET DAILY 90 tablet 3   tamsulosin (FLOMAX) 0.4 MG CAPS capsule TAKE 1 CAPSULE DAILY 90 capsule 1   XARELTO 20 MG TABS tablet TAKE 1 TABLET DAILY WITH   SUPPER 90 tablet 3   No current facility-administered medications for this visit.     Objective:  BP 130/66   Pulse 76   Temp 98.3 F (36.8 C) (Temporal)   Ht '6\' 1"'$  (1.854 m)   Wt 177 lb 6.4 oz (80.5 kg)   SpO2 98%   BMI  23.41 kg/m  Gen: NAD, resting comfortably CV: RRR no murmurs rubs or gallops Lungs: CTAB no crackles, wheeze, rhonchi Abdomen: soft/nontender/nondistended/normal bowel sounds. Ext: trace edema Skin: warm, dry     Assessment and Plan   # Diabetes S: Medication:diet controlled  CBGs- blood sugars up to 140 after steroid injection Lab Results  Component Value Date   HGBA1C 6.4 10/11/2020   HGBA1C 5.8 (A) 04/08/2020   HGBA1C 5.9 12/30/2019   A/P: hopefully stable- update a1c today. Continue without meds for now  #hypertension S: medication: off meds at present BP Readings from Last 3 Encounters:  04/11/21 130/66  04/06/21 112/78  02/23/21 126/70  A/P: controlled without meds- continue to monitor  #CAD #hyperlipidemia S: Medication:rosuvastatin 10 mg, aspirin 81 mg  No chest pain or shortness of breath Lab Results  Component Value Date   CHOL 113 12/30/2019   HDL 37.40 (L) 12/30/2019   LDLCALC 43 12/30/2019   LDLDIRECT 54.0 06/05/2017   TRIG 161.0 (H) 12/30/2019   CHOLHDL 3 12/30/2019   A/P: CAD asymptomatic- continue current meds. HLD- Lipids now due for repeat- update today.   # Atrial flutter S: Rate controlled without meds Anticoagulated with xarelto 20 mg A/P: Stable. Continue current medications.    # Bilateral Thumb pain S:pain started 3 weeks ago again- years and years of issues . Tylenol takes off edge but has not resolved yet.  Using voltaren gel some.  A/P: reasonably stable- continue voltaren gel   # bilateral shoulder pain- working with Dr. Tamala Julian. Dr. Tamera Punt did not think best decision to pursue surgery at this time- did get 2 injections and that was helpful- did get some numbness into right hand for 3 hours but resolved.   #Thyroid nodule-entered by Dr. Carolee Rota in 1 year from June 2021 planned  Lab Results  Component Value Date   TSH 1.18 02/23/2021   # elevated PSA- discovered by Dr. Tamala Julian- on repeat trending down but we still wanted to  get urology opinion. Patient states he had a very clear dream and was told he had cancer- he reports this started. Had been released from urology at age 28 and was told no more PSA.  Lab Results  Component Value Date   PSA 4.52 (H) 03/21/2021   PSA 5.21 (H) 02/23/2021   PSA 1.27 10/03/2017  - is on dutasteride and tamsulosin  Recommended follow up: 6 months Future Appointments  Date Time Provider [place]  05/18/2021  2:45 PM [name], DO [facility] None  06/06/2021  1:15 PM [name], DPM [facility]  09/20/2021  2:00 PM [name], PA-C [facility]  10/04/2021  2:45 PM [facility] PHARMACIST [facility]   Lab/Order associations:  NOT fasting- pimento cheese  for lunch and half peanut butter sandwich   ICD-10-CM   1. Type 2 diabetes mellitus with other circulatory complication, without long-term current use of insulin (HCC)  E11.59     2. Coronary artery disease involving native coronary artery of native heart without angina pectoris  I25.10     3. Atrial flutter, unspecified type ([place])  I48.92     4. Hyperlipidemia, unspecified hyperlipidemia type  E78.5     5. Essential hypertension  I10       Return precautions advised.  [name], MD

## 2021-04-12 DIAGNOSIS — S46002D Unspecified injury of muscle(s) and tendon(s) of the rotator cuff of left shoulder, subsequent encounter: Secondary | ICD-10-CM | POA: Diagnosis not present

## 2021-04-12 DIAGNOSIS — M75111 Incomplete rotator cuff tear or rupture of right shoulder, not specified as traumatic: Secondary | ICD-10-CM | POA: Diagnosis not present

## 2021-04-12 DIAGNOSIS — M6281 Muscle weakness (generalized): Secondary | ICD-10-CM | POA: Diagnosis not present

## 2021-04-13 ENCOUNTER — Other Ambulatory Visit: Payer: Self-pay

## 2021-04-13 ENCOUNTER — Encounter: Payer: Self-pay | Admitting: Family Medicine

## 2021-04-13 MED ORDER — ESCITALOPRAM OXALATE 10 MG PO TABS: 10.0000 mg | ORAL_TABLET | Freq: Every day | ORAL | 0 refills | Status: DC

## 2021-04-14 DIAGNOSIS — S46002D Unspecified injury of muscle(s) and tendon(s) of the rotator cuff of left shoulder, subsequent encounter: Secondary | ICD-10-CM | POA: Diagnosis not present

## 2021-04-14 DIAGNOSIS — M6281 Muscle weakness (generalized): Secondary | ICD-10-CM | POA: Diagnosis not present

## 2021-04-14 DIAGNOSIS — M75111 Incomplete rotator cuff tear or rupture of right shoulder, not specified as traumatic: Secondary | ICD-10-CM | POA: Diagnosis not present

## 2021-04-15 ENCOUNTER — Other Ambulatory Visit: Payer: Self-pay | Admitting: Family Medicine

## 2021-04-19 ENCOUNTER — Encounter: Payer: Self-pay | Admitting: Family Medicine

## 2021-04-19 DIAGNOSIS — S46002D Unspecified injury of muscle(s) and tendon(s) of the rotator cuff of left shoulder, subsequent encounter: Secondary | ICD-10-CM | POA: Diagnosis not present

## 2021-04-19 DIAGNOSIS — M75111 Incomplete rotator cuff tear or rupture of right shoulder, not specified as traumatic: Secondary | ICD-10-CM | POA: Diagnosis not present

## 2021-04-19 DIAGNOSIS — M6281 Muscle weakness (generalized): Secondary | ICD-10-CM | POA: Diagnosis not present

## 2021-04-21 DIAGNOSIS — M75111 Incomplete rotator cuff tear or rupture of right shoulder, not specified as traumatic: Secondary | ICD-10-CM | POA: Diagnosis not present

## 2021-04-21 DIAGNOSIS — S46002D Unspecified injury of muscle(s) and tendon(s) of the rotator cuff of left shoulder, subsequent encounter: Secondary | ICD-10-CM | POA: Diagnosis not present

## 2021-04-21 DIAGNOSIS — M6281 Muscle weakness (generalized): Secondary | ICD-10-CM | POA: Diagnosis not present

## 2021-04-26 DIAGNOSIS — M6281 Muscle weakness (generalized): Secondary | ICD-10-CM | POA: Diagnosis not present

## 2021-04-26 DIAGNOSIS — S46002D Unspecified injury of muscle(s) and tendon(s) of the rotator cuff of left shoulder, subsequent encounter: Secondary | ICD-10-CM | POA: Diagnosis not present

## 2021-04-26 DIAGNOSIS — M75111 Incomplete rotator cuff tear or rupture of right shoulder, not specified as traumatic: Secondary | ICD-10-CM | POA: Diagnosis not present

## 2021-04-28 DIAGNOSIS — M6281 Muscle weakness (generalized): Secondary | ICD-10-CM | POA: Diagnosis not present

## 2021-04-28 DIAGNOSIS — M75111 Incomplete rotator cuff tear or rupture of right shoulder, not specified as traumatic: Secondary | ICD-10-CM | POA: Diagnosis not present

## 2021-04-28 DIAGNOSIS — S46002D Unspecified injury of muscle(s) and tendon(s) of the rotator cuff of left shoulder, subsequent encounter: Secondary | ICD-10-CM | POA: Diagnosis not present

## 2021-05-01 ENCOUNTER — Encounter: Payer: Self-pay | Admitting: Family Medicine

## 2021-05-02 ENCOUNTER — Other Ambulatory Visit: Payer: Self-pay

## 2021-05-02 MED ORDER — SILDENAFIL CITRATE 20 MG PO TABS: ORAL_TABLET | ORAL | 3 refills | Status: DC
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
## 2021-05-03 ENCOUNTER — Ambulatory Visit: Payer: Self-pay

## 2021-05-03 ENCOUNTER — Ambulatory Visit (INDEPENDENT_AMBULATORY_CARE_PROVIDER_SITE_OTHER): Payer: Medicare Other | Admitting: Family Medicine

## 2021-05-03 ENCOUNTER — Other Ambulatory Visit: Payer: Self-pay

## 2021-05-03 ENCOUNTER — Encounter: Payer: Self-pay | Admitting: Family Medicine

## 2021-05-03 VITALS — BP 148/86 | HR 68 | Ht 73.0 in | Wt 182.0 lb

## 2021-05-03 DIAGNOSIS — I2510 Atherosclerotic heart disease of native coronary artery without angina pectoris: Secondary | ICD-10-CM | POA: Diagnosis not present

## 2021-05-03 DIAGNOSIS — M79645 Pain in left finger(s): Secondary | ICD-10-CM | POA: Diagnosis not present

## 2021-05-03 DIAGNOSIS — M79644 Pain in right finger(s): Secondary | ICD-10-CM

## 2021-05-03 DIAGNOSIS — M180 Bilateral primary osteoarthritis of first carpometacarpal joints: Secondary | ICD-10-CM

## 2021-05-03 NOTE — Assessment & Plan Note (Signed)
Left-sided injected today.  Held on the right side with patient being the primary driver.  Patient will have a potential injection in 1 week for him.  Discussed with patient and told to try continue to stay active.  Seem to be aggravated with patient doing physical therapy for the neck and shoulder.  Follow-up again in 1 week for his regular activity.

## 2021-05-03 NOTE — Patient Instructions (Signed)
See you next week :)

## 2021-05-04 DIAGNOSIS — M67912 Unspecified disorder of synovium and tendon, left shoulder: Secondary | ICD-10-CM | POA: Diagnosis not present

## 2021-05-04 DIAGNOSIS — M67911 Unspecified disorder of synovium and tendon, right shoulder: Secondary | ICD-10-CM | POA: Diagnosis not present
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
## 2021-05-18 ENCOUNTER — Ambulatory Visit (INDEPENDENT_AMBULATORY_CARE_PROVIDER_SITE_OTHER): Payer: Medicare Other

## 2021-05-18 ENCOUNTER — Ambulatory Visit (INDEPENDENT_AMBULATORY_CARE_PROVIDER_SITE_OTHER): Payer: Medicare Other | Admitting: Family Medicine

## 2021-05-18 ENCOUNTER — Other Ambulatory Visit: Payer: Self-pay

## 2021-05-18 VITALS — BP 102/72 | HR 71 | Ht 73.0 in | Wt 179.0 lb

## 2021-05-18 DIAGNOSIS — M25532 Pain in left wrist: Secondary | ICD-10-CM | POA: Diagnosis not present

## 2021-05-18 DIAGNOSIS — M5030 Other cervical disc degeneration, unspecified cervical region: Secondary | ICD-10-CM

## 2021-05-18 DIAGNOSIS — M9908 Segmental and somatic dysfunction of rib cage: Secondary | ICD-10-CM | POA: Diagnosis not present

## 2021-05-18 DIAGNOSIS — M9904 Segmental and somatic dysfunction of sacral region: Secondary | ICD-10-CM | POA: Diagnosis not present

## 2021-05-18 DIAGNOSIS — I2510 Atherosclerotic heart disease of native coronary artery without angina pectoris: Secondary | ICD-10-CM

## 2021-05-18 DIAGNOSIS — M9902 Segmental and somatic dysfunction of thoracic region: Secondary | ICD-10-CM | POA: Diagnosis not present

## 2021-05-18 DIAGNOSIS — M180 Bilateral primary osteoarthritis of first carpometacarpal joints: Secondary | ICD-10-CM | POA: Diagnosis not present

## 2021-05-18 DIAGNOSIS — M9903 Segmental and somatic dysfunction of lumbar region: Secondary | ICD-10-CM

## 2021-05-18 DIAGNOSIS — M25531 Pain in right wrist: Secondary | ICD-10-CM | POA: Diagnosis not present

## 2021-05-18 DIAGNOSIS — M9901 Segmental and somatic dysfunction of cervical region: Secondary | ICD-10-CM

## 2021-05-18 IMAGING — DX DG WRIST COMPLETE 3+V*R*
4 series · 4 of 4 positions shown · non-contrast
Comparison: None.

CLINICAL DATA: Chronic right wrist pain.

EXAM:
RIGHT WRIST - COMPLETE 3+ VIEW

[wrist ap]
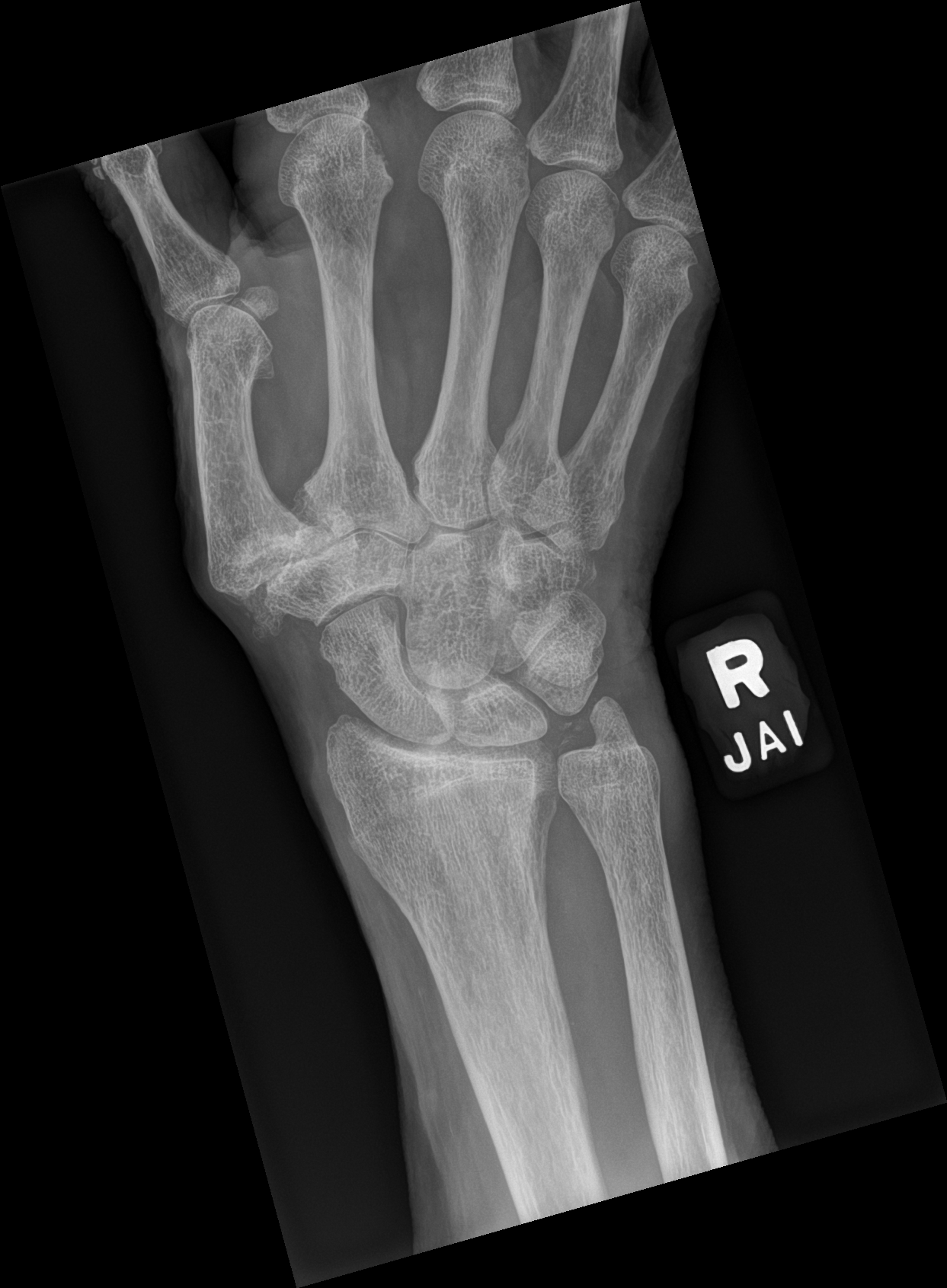

[wrist obl]
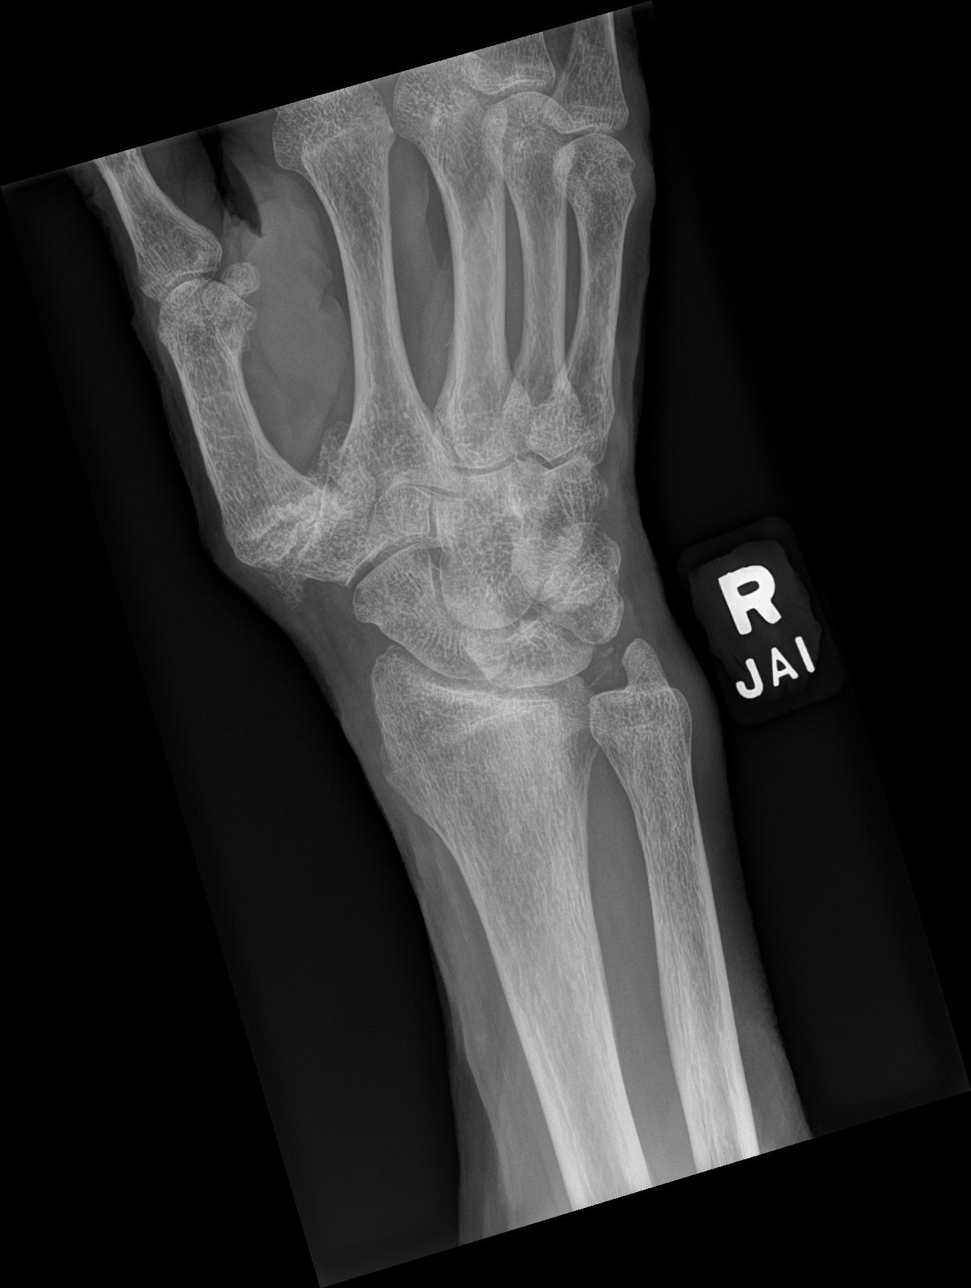

[wrist lat]
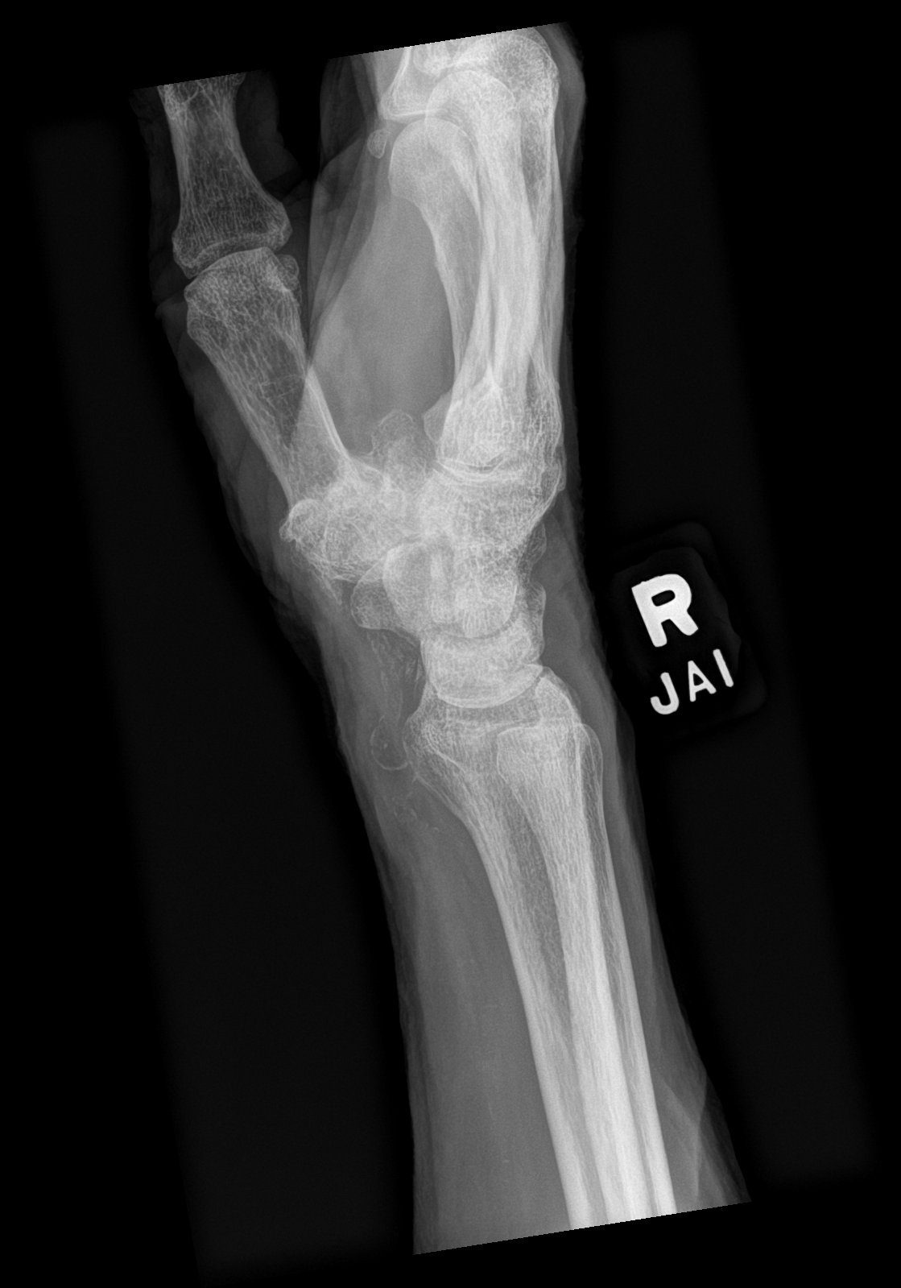

[scaphoid]
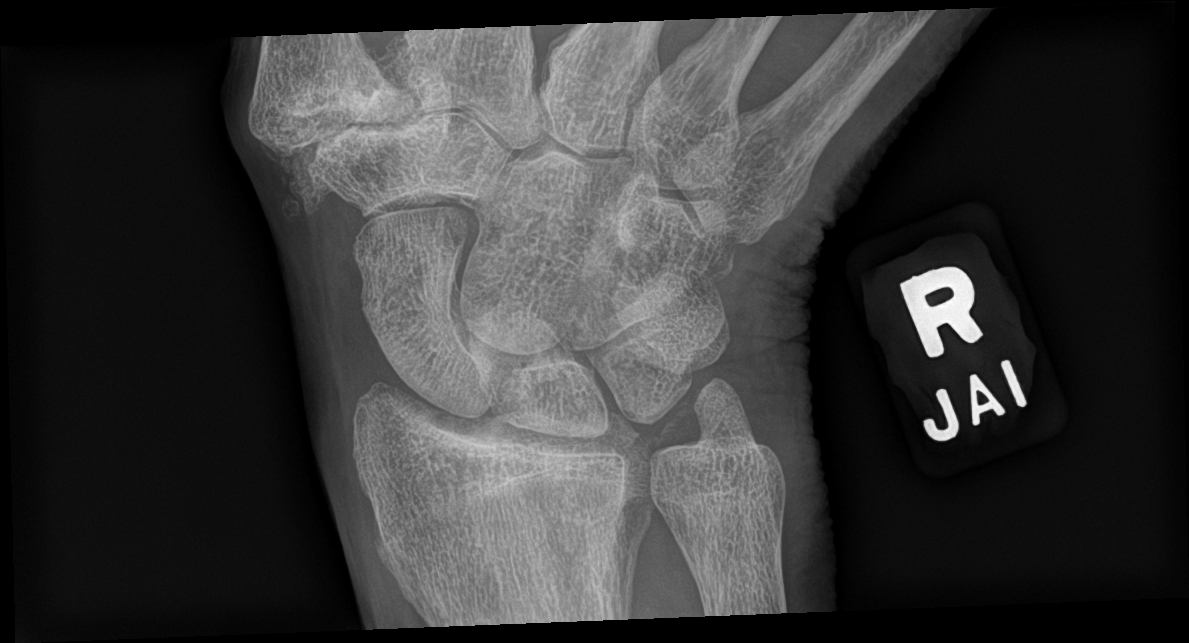

[4 of 4 positions shown; findings below may reference images not displayed]

FINDINGS: Normal wrist alignment. Joint space narrowing with subchondral
cystic change and fragmented osteophytes at the thumb carpal
metacarpal joint. Occasional degenerative carpal bone cysts. No
fracture or erosion. Chondrocalcinosis of the triangular
fibrocartilage. Vascular calcifications are seen.
IMPRESSION: 1. Advanced osteoarthritis at the thumb carpometacarpal joint.
2. Chondrocalcinosis of the triangular fibrocartilage.

## 2021-05-18 NOTE — Patient Instructions (Signed)
L wrist brace with thumb to wear at night Continue all else See me again in 6 weeks

## 2021-05-18 NOTE — Assessment & Plan Note (Signed)
Improvement noted after last injection.We will continue to monitor.  Given a brace to wear at night.  He is having bilateral wrist pain and we will monitor.  Worsening pain may need to consider carpal tunnel injections

## 2021-05-18 NOTE — Assessment & Plan Note (Signed)
Bilateral wrist pain.  Differential is quite broad but likely mostly more arthritic changes with the possibility of carpal tunnel.  Discussed the bracing at night to see if this will be more beneficial.  Worsening pain consider either the median nerve injection for carpal tunnel or a nerve conduction study to further evaluate if more cervical radiculopathy is playing a role.

## 2021-05-18 NOTE — Assessment & Plan Note (Signed)
Significant degenerative changes.  Patient has responded well to osteopathic manipulation.  Mostly muscle energy at this point.  Responding relatively well.  Discussed icing regimen and home exercises.  Which activities to do and which ones to avoid.  Increase activity slowly.

## 2021-05-24 ENCOUNTER — Encounter: Payer: Self-pay | Admitting: Family Medicine

## 2021-05-25 ENCOUNTER — Telehealth: Payer: Self-pay | Admitting: Pharmacist

## 2021-05-25 NOTE — Progress Notes (Addendum)
Chronic Care Management Pharmacy Assistant   Name: Sean Lane  MRN: [id number] DOB: 1927-12-24   Reason for Encounter: Disease State - Hypertension Call    Recent office visits:  04/11/21 [name], MD (PCP) - Family Medicine - Labs were ordered. No medication changes. Follow up in 6 months.    Recent consult visits:  05/18/21 Hulan Saas, MD - Sport Medicine - Right wrist pain - Xray right wrist ordered. L wrist brace with thumb to wear at night given. Follow up in 6 weeks.   05/03/21 - [facility] - Bilateral thumb pain - [place] limited bilateral ordered. No medication changes. Follow up in 1 week for injection.   04/06/21 Hulan Saas, MD - Sports Medicine - Somatic dysfunction of cervical spine - treated with HVLA, FPR techniques in cervical,  thoracic, lumbar, and sacral  areas. Exercises given. Follow up in 6 weeks.    Hospital visits:  None in previous 6 months   Medications: Outpatient Encounter Medications as of 05/25/2021  Medication Sig   acetaminophen (TYLENOL) 650 MG CR tablet Take 1,300 mg by mouth every 8 (eight) hours as needed for pain.   aspirin EC 81 MG tablet Take 81 mg by mouth daily.   cholecalciferol (VITAMIN D3) 25 MCG (1000 UNIT) tablet Take 2,000 Units by mouth daily.   CREAM BASE EX Apply 1 application topically at bedtime. Diabetics' Dry Skin Relief (applied to feet)   dutasteride (AVODART) 0.5 MG capsule TAKE 1 CAPSULE DAILY   escitalopram (LEXAPRO) 10 MG tablet Take 1 tablet (10 mg total) by mouth daily.   FREESTYLE INSULINX TEST test strip USE TO CHECK BLOOD SUGAR DAILY AND AS NEEDED DIAG CODE E11.9   Lancets (FREESTYLE) lancets E11.9 -CHECK BLOOD SUGAR ONCE DAILY   Naphazoline-Pheniramine (OPCON-A OP) Apply 1 drop to eye daily as needed (allergies).   nitroGLYCERIN (NITROSTAT) 0.4 MG SL tablet Place 1 tablet (0.4 mg total) under the tongue every 5 (five) minutes as needed for chest pain (for chest pain). Use up to 3  dosages, if no relief call 911.   Omega-3 Fatty Acids (FISH OIL) 1200 MG CAPS Take 2 capsules by mouth 2 (two) times daily.   rosuvastatin (CRESTOR) 10 MG tablet TAKE 1 TABLET DAILY   sildenafil (REVATIO) 20 MG tablet Take 1 tablet daily PRN for erectile dysfunciton.   tamsulosin (FLOMAX) 0.4 MG CAPS capsule TAKE 1 CAPSULE DAILY   XARELTO 20 MG TABS tablet TAKE 1 TABLET DAILY WITH   SUPPER   No facility-administered encounter medications on file as of 05/25/2021.    Current antihypertensive regimen:  No prescription medications at this time.  How often are you checking your Blood Pressure?  Patient's wife reported patient has not been checking his blood pressures lately.   Current home BP readings: Patient's wife did not have any recent readings to report.   What recent interventions/DTPs have been made by any provider to improve Blood Pressure control since last CPP Visit:  Patients wife reports there has been no changes in his current medication regimen.    Any recent hospitalizations or ED visits since last visit with CPP?  Patient has not had any hospitalizations or ED visits since last visit with CPP.    What diet changes have been made to improve Blood Pressure Control?  Patient's wife reported patient is conscious of salt intake.    What exercise is being done to improve your Blood Pressure Control?  Patient's wife reported patient  has not been as active as usual as he has recently been having an issue with his rotator cuff. They also have not been going to the gyn three times a week like before due to [place] and wanting to be careful.    Adherence Review: Is the patient currently on ACE/ARB medication? No Does the patient have >5 day gap between last estimated fill dates? No   Care Gaps  AWV: done 11/16/20 Colonoscopy: done 11/15/07 DM Eye Exam: due 11/09/21 DEXA: never Mammogram: N/A   Star Rating Drugs: Rosuvastatin (CRESTOR) 10 MG tablet - last filled 03/06/21 90  days    Future Appointments  Date Time Provider [place]  06/06/2021  1:15 PM [name], DPM [facility]  06/29/2021  2:00 PM [name], DO [facility] None  09/20/2021  2:00 PM [name], PA-C [facility]  10/04/2021  2:45 PM [facility] CCM PHARMACIST [facility]  10/13/2021  1:40 PM [name], MD [facility] [place], [facility] Clinical Pharmacist Assistant  [phone]

## 2021-05-26 ENCOUNTER — Other Ambulatory Visit: Payer: Self-pay | Admitting: Family Medicine

## 2021-05-26 ENCOUNTER — Ambulatory Visit: Payer: Self-pay

## 2021-05-26 ENCOUNTER — Encounter: Payer: Self-pay | Admitting: Family Medicine

## 2021-05-26 ENCOUNTER — Ambulatory Visit (INDEPENDENT_AMBULATORY_CARE_PROVIDER_SITE_OTHER): Payer: Medicare Other | Admitting: Family Medicine

## 2021-05-26 ENCOUNTER — Other Ambulatory Visit: Payer: Self-pay

## 2021-05-26 VITALS — BP 110/72 | HR 75 | Ht 73.0 in | Wt 193.0 lb

## 2021-05-26 DIAGNOSIS — I2510 Atherosclerotic heart disease of native coronary artery without angina pectoris: Secondary | ICD-10-CM | POA: Diagnosis not present

## 2021-05-26 DIAGNOSIS — M25532 Pain in left wrist: Secondary | ICD-10-CM | POA: Diagnosis not present

## 2021-05-26 DIAGNOSIS — M25531 Pain in right wrist: Secondary | ICD-10-CM | POA: Diagnosis not present

## 2021-05-26 NOTE — Assessment & Plan Note (Signed)
Bilateral carpal tunnel injection given today.  He does have arthritic changes that is likely contributing to some of the pain as well.  Patient CMC joints also has significant pain and if no improvement I would consider potentially injections in the knees.  Patient though before he even left seem to be doing better.  We discussed with him having more numbness in the hands to be significantly careful with any type of driving.  Patient is accompanied with wife.  We discussed with patient about other treatment options if necessary.  We will follow-up with me again as scheduled for his regular maintenance exam.

## 2021-05-26 NOTE — Patient Instructions (Addendum)
Injected both wrists Be careful traveling home Ice in 6 hours Redness or swelling, seek medical attention See you at next scheduled visit
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
## 2021-05-27 DIAGNOSIS — Z23 Encounter for immunization: Secondary | ICD-10-CM | POA: Diagnosis not present

## 2021-06-03 DIAGNOSIS — N200 Calculus of kidney: Secondary | ICD-10-CM | POA: Diagnosis not present

## 2021-06-03 DIAGNOSIS — R972 Elevated prostate specific antigen [PSA]: Secondary | ICD-10-CM | POA: Diagnosis not present

## 2021-06-06 ENCOUNTER — Encounter: Payer: Self-pay | Admitting: Family Medicine

## 2021-06-06 ENCOUNTER — Other Ambulatory Visit: Payer: Self-pay

## 2021-06-06 ENCOUNTER — Ambulatory Visit (INDEPENDENT_AMBULATORY_CARE_PROVIDER_SITE_OTHER): Payer: Medicare Other | Admitting: Podiatry

## 2021-06-06 DIAGNOSIS — M79674 Pain in right toe(s): Secondary | ICD-10-CM | POA: Diagnosis not present

## 2021-06-06 DIAGNOSIS — B351 Tinea unguium: Secondary | ICD-10-CM

## 2021-06-06 DIAGNOSIS — D689 Coagulation defect, unspecified: Secondary | ICD-10-CM

## 2021-06-06 DIAGNOSIS — E1151 Type 2 diabetes mellitus with diabetic peripheral angiopathy without gangrene: Secondary | ICD-10-CM | POA: Diagnosis not present

## 2021-06-06 DIAGNOSIS — M79675 Pain in left toe(s): Secondary | ICD-10-CM | POA: Diagnosis not present

## 2021-06-06 MED ORDER — SILDENAFIL CITRATE 20 MG PO TABS: ORAL_TABLET | ORAL | 3 refills | Status: DC

## 2021-06-06 NOTE — Progress Notes (Signed)
This patient returns to my office for at risk foot care.  This patient requires this care by a professional since this patient will be at risk due to having type 2 diabetes and coagulation defect.  Patient is taking xarelto.  This patient presents to the office with his wife.  This patient is unable to cut nails himself since the patient cannot reach his nails.These nails are painful walking and wearing shoes.  This patient presents for at risk foot care today.  General Appearance  Alert, conversant and in no acute stress.  Vascular  Dorsalis pedis   pulses are palpable  bilaterally. Posterior tibial pulses are absent  Bilaterally. Capillary return is within normal limits  bilaterally. Temperature is within normal limits  bilaterally.  Neurologic  Senn-Weinstein monofilament wire test diminished   bilaterally. Muscle power within normal limits bilaterally.  Nails Thick disfigured discolored nails with subungual debris  from hallux to fifth toes bilaterally. Hammer toes 2-4  B/L.  Orthopedic  No limitations of motion  feet .  No crepitus or effusions noted.  No bony pathology or digital deformities noted.  Skin  normotropic skin with no porokeratosis noted bilaterally.  No signs of infections or ulcers noted.     Onychomycosis  Pain in right toes  Pain in left toes  Consent was obtained for treatment procedures.   Mechanical debridement of nails 1-5  bilaterally performed with a nail nipper.  Filed with dremel without incident.    Return office visit   10 weeks                 Told patient to return for periodic foot care and evaluation due to potential at risk complications.   Khyler Eschmann DPM  

## 2021-06-09 ENCOUNTER — Other Ambulatory Visit: Payer: Self-pay | Admitting: Family Medicine

## 2021-06-09 ENCOUNTER — Ambulatory Visit: Payer: Medicare Other | Admitting: Urology

## 2021-06-23 ENCOUNTER — Telehealth: Payer: Self-pay | Admitting: Family Medicine

## 2021-06-23 NOTE — Telephone Encounter (Signed)
Copied from [place] [phone]. Topic: Medicare AWV >> Jun 23, 2021  1:46 PM Harris-Coley, Hannah Beat wrote: Reason for CRM: Attempted to schedule AWV. Unable to LVM.  Will try at later time.
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
## 2021-06-29 ENCOUNTER — Other Ambulatory Visit: Payer: Self-pay

## 2021-06-29 ENCOUNTER — Ambulatory Visit: Payer: Self-pay

## 2021-06-29 ENCOUNTER — Ambulatory Visit (INDEPENDENT_AMBULATORY_CARE_PROVIDER_SITE_OTHER): Payer: Medicare Other | Admitting: Family Medicine

## 2021-06-29 VITALS — BP 116/58 | HR 64 | Ht 73.0 in | Wt 186.0 lb

## 2021-06-29 DIAGNOSIS — M9902 Segmental and somatic dysfunction of thoracic region: Secondary | ICD-10-CM | POA: Diagnosis not present

## 2021-06-29 DIAGNOSIS — M9908 Segmental and somatic dysfunction of rib cage: Secondary | ICD-10-CM

## 2021-06-29 DIAGNOSIS — M999 Biomechanical lesion, unspecified: Secondary | ICD-10-CM

## 2021-06-29 DIAGNOSIS — M25532 Pain in left wrist: Secondary | ICD-10-CM

## 2021-06-29 DIAGNOSIS — M9901 Segmental and somatic dysfunction of cervical region: Secondary | ICD-10-CM

## 2021-06-29 DIAGNOSIS — M9904 Segmental and somatic dysfunction of sacral region: Secondary | ICD-10-CM

## 2021-06-29 DIAGNOSIS — M5136 Other intervertebral disc degeneration, lumbar region: Secondary | ICD-10-CM | POA: Diagnosis not present

## 2021-06-29 DIAGNOSIS — M5030 Other cervical disc degeneration, unspecified cervical region: Secondary | ICD-10-CM

## 2021-06-29 DIAGNOSIS — I2510 Atherosclerotic heart disease of native coronary artery without angina pectoris: Secondary | ICD-10-CM | POA: Diagnosis not present

## 2021-06-29 DIAGNOSIS — M9903 Segmental and somatic dysfunction of lumbar region: Secondary | ICD-10-CM | POA: Diagnosis not present

## 2021-06-29 DIAGNOSIS — M25531 Pain in right wrist: Secondary | ICD-10-CM

## 2021-06-29 NOTE — Patient Instructions (Signed)
See me in 6-7 weeks 

## 2021-06-30 ENCOUNTER — Encounter: Payer: Self-pay | Admitting: Family Medicine

## 2021-06-30 NOTE — Assessment & Plan Note (Signed)
Responding well to conservative therapy.  Responding well to the manipulation.  Discussed posture and numbness again.  No significant change in management and follow-up again in 6 weeks

## 2021-06-30 NOTE — Assessment & Plan Note (Signed)
Patient has no significant pain at all at this point.  We will continue to monitor and can repeat if necessary in the future.

## 2021-06-30 NOTE — Assessment & Plan Note (Signed)
Stable overall.  Discussed icing regimen and home exercises.  Discussed which activities to do which wants to avoid.  Follow-up again in 6 weeks

## 2021-06-30 NOTE — Assessment & Plan Note (Signed)
   Decision today to treat with OMT was based on Physical Exam  After verbal consent patient was treated with  ME, FPR techniques in cervical, thoracic, rib, lumbar and sacral areas, all areas are chronic   Patient tolerated the procedure well with improvement in symptoms  Patient given exercises, stretches and lifestyle modifications  See medications in patient instructions if given  Patient will follow up in 4-8 weeks 

## 2021-07-05 ENCOUNTER — Other Ambulatory Visit: Payer: Self-pay | Admitting: Family Medicine
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
## 2021-08-11 ENCOUNTER — Encounter: Payer: Self-pay | Admitting: Family Medicine

## 2021-08-11 ENCOUNTER — Ambulatory Visit (INDEPENDENT_AMBULATORY_CARE_PROVIDER_SITE_OTHER): Payer: Medicare Other | Admitting: Family Medicine

## 2021-08-11 ENCOUNTER — Other Ambulatory Visit: Payer: Self-pay

## 2021-08-11 VITALS — BP 126/50 | HR 77 | Ht 73.0 in | Wt 191.0 lb

## 2021-08-11 DIAGNOSIS — M9904 Segmental and somatic dysfunction of sacral region: Secondary | ICD-10-CM

## 2021-08-11 DIAGNOSIS — M5136 Other intervertebral disc degeneration, lumbar region: Secondary | ICD-10-CM | POA: Diagnosis not present

## 2021-08-11 DIAGNOSIS — M9908 Segmental and somatic dysfunction of rib cage: Secondary | ICD-10-CM

## 2021-08-11 DIAGNOSIS — M9901 Segmental and somatic dysfunction of cervical region: Secondary | ICD-10-CM | POA: Diagnosis not present

## 2021-08-11 DIAGNOSIS — I2510 Atherosclerotic heart disease of native coronary artery without angina pectoris: Secondary | ICD-10-CM

## 2021-08-11 DIAGNOSIS — M9902 Segmental and somatic dysfunction of thoracic region: Secondary | ICD-10-CM | POA: Diagnosis not present

## 2021-08-11 DIAGNOSIS — M9903 Segmental and somatic dysfunction of lumbar region: Secondary | ICD-10-CM | POA: Diagnosis not present

## 2021-08-11 NOTE — Patient Instructions (Signed)
No big changes see me in 6 weeks

## 2021-08-11 NOTE — Assessment & Plan Note (Signed)
Chronic problem with doing relatively well at the moment.  Nothing significant.  Patient has felt like the osteopathic manipulation has been very helpful.  Discussed with patient to continue to stay active.  Discussed which activities to do which wants to avoid.  Increase activity slowly.  Follow-up again in 6 to 8 weeks

## 2021-08-15 ENCOUNTER — Other Ambulatory Visit: Payer: Self-pay

## 2021-08-15 ENCOUNTER — Ambulatory Visit (INDEPENDENT_AMBULATORY_CARE_PROVIDER_SITE_OTHER): Payer: Medicare Other | Admitting: Podiatry

## 2021-08-15 DIAGNOSIS — M79675 Pain in left toe(s): Secondary | ICD-10-CM

## 2021-08-15 DIAGNOSIS — D689 Coagulation defect, unspecified: Secondary | ICD-10-CM | POA: Diagnosis not present

## 2021-08-15 DIAGNOSIS — E1159 Type 2 diabetes mellitus with other circulatory complications: Secondary | ICD-10-CM

## 2021-08-15 DIAGNOSIS — M79674 Pain in right toe(s): Secondary | ICD-10-CM

## 2021-08-15 DIAGNOSIS — E1151 Type 2 diabetes mellitus with diabetic peripheral angiopathy without gangrene: Secondary | ICD-10-CM

## 2021-08-15 DIAGNOSIS — B351 Tinea unguium: Secondary | ICD-10-CM

## 2021-08-15 NOTE — Progress Notes (Signed)
This patient returns to my office for at risk foot care.  This patient requires this care by a professional since this patient will be at risk due to having type 2 diabetes and coagulation defect.  Patient is taking xarelto.  This patient presents to the office with his wife.  This patient is unable to cut nails himself since the patient cannot reach his nails.These nails are painful walking and wearing shoes.  This patient presents for at risk foot care today.  General Appearance  Alert, conversant and in no acute stress.  Vascular  Dorsalis pedis   pulses are palpable  bilaterally. Posterior tibial pulses are absent  Bilaterally. Capillary return is within normal limits  bilaterally. Temperature is within normal limits  bilaterally.  Neurologic  Senn-Weinstein monofilament wire test diminished   bilaterally. Muscle power within normal limits bilaterally.  Nails Thick disfigured discolored nails with subungual debris  from hallux to fifth toes bilaterally. Hammer toes 2-4  B/L.  Orthopedic  No limitations of motion  feet .  No crepitus or effusions noted.  No bony pathology or digital deformities noted.  Skin  normotropic skin with no porokeratosis noted bilaterally.  No signs of infections or ulcers noted.     Onychomycosis  Pain in right toes  Pain in left toes  Consent was obtained for treatment procedures.   Mechanical debridement of nails 1-5  bilaterally performed with a nail nipper.  Filed with dremel without incident.    Return office visit   10 weeks                 Told patient to return for periodic foot care and evaluation due to potential at risk complications.   Anaijah Augsburger DPM  

## 2021-09-20 ENCOUNTER — Other Ambulatory Visit: Payer: Self-pay | Admitting: Family Medicine

## 2021-09-20 ENCOUNTER — Ambulatory Visit: Payer: Medicare Other | Admitting: Physician Assistant
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
## 2021-09-21 ENCOUNTER — Ambulatory Visit (INDEPENDENT_AMBULATORY_CARE_PROVIDER_SITE_OTHER): Payer: Medicare Other | Admitting: Family Medicine

## 2021-09-21 ENCOUNTER — Other Ambulatory Visit: Payer: Self-pay

## 2021-09-21 VITALS — BP 138/62 | HR 77 | Ht 73.0 in | Wt 195.0 lb

## 2021-09-21 DIAGNOSIS — M5136 Other intervertebral disc degeneration, lumbar region: Secondary | ICD-10-CM | POA: Diagnosis not present

## 2021-09-21 DIAGNOSIS — M9903 Segmental and somatic dysfunction of lumbar region: Secondary | ICD-10-CM | POA: Diagnosis not present

## 2021-09-21 DIAGNOSIS — M9901 Segmental and somatic dysfunction of cervical region: Secondary | ICD-10-CM | POA: Diagnosis not present

## 2021-09-21 DIAGNOSIS — M9904 Segmental and somatic dysfunction of sacral region: Secondary | ICD-10-CM

## 2021-09-21 DIAGNOSIS — M9908 Segmental and somatic dysfunction of rib cage: Secondary | ICD-10-CM

## 2021-09-21 DIAGNOSIS — M9902 Segmental and somatic dysfunction of thoracic region: Secondary | ICD-10-CM

## 2021-09-21 NOTE — Progress Notes (Signed)
° °Chronic Care Management °Pharmacy Note ° °10/04/2021 °Name:  Sean Lane MRN:  [id number] DOB:  09/23/1927 ° °Summary: °PharmD follow up - requesting new rx for blood glucose meter and test strips. ° ° °Subjective: °Sean Lane is an 88 y.o. year old male who is a primary patient of Hunter, Stephen O, MD.  The CCM team was consulted for assistance with disease management and care coordination needs.   ° °Engaged with patient by telephone for follow up visit in response to provider referral for pharmacy case management and/or care coordination services.  ° °Consent to Services:  °The patient was given the following information about Chronic Care Management services today, agreed to services, and gave verbal consent: 1. CCM service includes personalized support from designated clinical staff supervised by the primary care provider, including individualized plan of care and coordination with other care providers 2. 24/7 contact phone numbers for assistance for urgent and routine care needs. 3. Service will only be billed when office clinical staff spend 20 minutes or more in a month to coordinate care. 4. Only one practitioner may furnish and bill the service in a calendar month. 5.The patient may stop CCM services at any time (effective at the end of the month) by phone call to the office staff. 6. The patient will be responsible for cost sharing (co-pay) of up to 20% of the service fee (after annual deductible is met). Patient agreed to services and consent obtained. ° °Patient Care Team: °Hunter, Stephen O, MD as PCP - General (Family Medicine) °Nishan, Peter C, MD as PCP - Cardiology (Cardiology) °Aluisio, Frank, MD as Consulting Physician (Orthopedic Surgery) °Bowen, Bradley, MD as Consulting Physician (Ophthalmology) °Smith, Zachary M, DO as Consulting Physician (Family Medicine) °Mayer, Gregory, DPM as Consulting Physician (Podiatry) °[facility] as Consulting Physician (Audiology) °Sheffield,  Kelli R, PA-C as Physician Assistant (Dermatology) °Davis, Christian L, RPH (Pharmacist) ° ° °Recent office visits:  °None ° °Recent consult visits:  °12/01/2020 [facility] Sports Medicine, Smith Zachary M, MD; no medication changes indicated. °  °12/09/2020 [facility] Sports Medicine, Smith Zachary M, MD; no medication changes indicated. °  °12/14/2020 [facility] Podiatry, Mayer, Gregory, DPM; no medication changes indicated. °  °01/12/2021 [facility] Sports Medicine, Smith Zachary M, MD; no medication changes indicated. °  °01/31/2021 [facility] Cardiology, Nishan, Peter C, MD; no medication changes indicated. °  °01/12/2021 [facility] Sports Medicine, Smith Zachary M, MD; no medication changes indicated. °  °03/22/2021 [facility] Podiatry, Mayer, Gregory, DPM; no medication changes indicated. °  °  °Hospital visits:  °None in previous 6 months ° ° °Objective: ° °Lab Results  °Component Value Date  ° CREATININE 0.81 04/11/2021  ° BUN 19 04/11/2021  ° GFR 77.47 04/11/2021  ° GFRNONAA 84 08/27/2017  ° GFRAA 97 08/27/2017  ° NA 138 04/11/2021  ° K 4.1 04/11/2021  ° CALCIUM 9.9 04/11/2021  ° CO2 27 04/11/2021  ° GLUCOSE 127 (H) 04/11/2021  ° ° °Lab Results  °Component Value Date/Time  ° HGBA1C 6.4 04/11/2021 02:39 PM  ° HGBA1C 6.4 10/11/2020 02:38 PM  ° GFR 77.47 04/11/2021 02:39 PM  ° GFR 75.62 02/23/2021 02:59 PM  ° MICROALBUR 6.7 (H) 04/11/2021 02:39 PM  ° MICROALBUR 3.0 (H) 11/22/2015 03:26 PM  °  °Last diabetic Eye exam:  °Lab Results  °Component Value Date/Time  ° HMDIABEYEEXA No Retinopathy 11/09/2020 12:00 AM  °  °Last diabetic Foot exam: No results found for: HMDIABFOOTEX  ° °Lab Results  °Component Value Date  ° CHOL 118   118 04/11/2021   HDL 44.10 04/11/2021   LDLCALC 46 04/11/2021   LDLDIRECT 54.0 06/05/2017   TRIG 141.0 04/11/2021   CHOLHDL 3 04/11/2021    Hepatic Function Latest Ref Rng & Units 04/11/2021 02/23/2021 10/11/2020  Total Protein 6.0 - 8.3 g/dL 6.9 6.8 7.0  Albumin 3.5 - 5.2 g/dL 4.2 4.3 4.4  AST 0 - 37 U/L _0 ALT 0 - 53 U/L _1 Alk Phosphatase 39 - 117 U/L 89 99 77  Total Bilirubin 0.2 - 1.2 mg/dL 0.5 0.5 0.5  Bilirubin, Direct 0.0 - 0.3 mg/dL - - -    Lab Results  Component Value Date/Time   TSH 1.18 02/23/2021 02:59 PM   TSH 0.99 12/30/2019 02:47 PM    CBC Latest Ref Rng & Units 04/11/2021 02/23/2021 11/15/2020  WBC 4.0 - 10.5 K/uL 4.2 5.0 5.3  Hemoglobin 13.0 - 17.0 g/dL 12.4(L) 12.7(L) 13.2  Hematocrit 39.0 - 52.0 % 38.1(L) 38.2(L) 39.3  Platelets 150.0 - 400.0 K/uL 188.0 234.0 220.0    No results found for: VD25OH  Clinical ASCVD: No  The ASCVD Risk score (Arnett DK, et al., 2019) failed to calculate for the following reasons:   The 2019 ASCVD risk score is only valid for ages 31 to 59    Depression screen PHQ 2/9 09/07/2020 12/30/2019 10/21/2019  Decreased Interest 0 0 0  Down, Depressed, Hopeless 0 0 0  PHQ - 2 Score 0 0 0  Altered sleeping - 2 -  Tired, decreased energy - 2 -  Change in appetite - 1 -  Feeling bad or failure about yourself  - 0 -  Trouble concentrating - 0 -  Moving slowly or fidgety/restless - 0 -  Suicidal thoughts - 0 -  PHQ-9 Score - 5 -  Difficult doing work/chores - Somewhat difficult -  Some recent data might be hidden      Social History   Tobacco Use  Smoking Status Former   Types: Cigarettes   Quit date: 09/11/1961   Years since quitting: 60.1  Smokeless Tobacco Never   BP Readings from Last 3 Encounters:  09/21/21 138/62  08/11/21 (!) 126/50  06/29/21 (!) 116/58   Pulse Readings from Last 3 Encounters:  09/21/21 77  08/11/21 77  06/29/21 64   Wt Readings from Last 3 Encounters:  09/21/21 195 lb (88.5 kg)  08/11/21 191 lb (86.6 kg)  06/29/21 186 lb (84.4 kg)   BMI Readings from Last 3 Encounters:  09/21/21 25.73 kg/m  08/11/21 25.20 kg/m  06/29/21 24.54 kg/m    Assessment/Interventions: Review of patient past medical history, allergies, medications, health status, including review of consultants reports, laboratory and other test data, was  performed as part of comprehensive evaluation and provision of chronic care management services.   SDOH:  (Social Determinants of Health) assessments and interventions performed: Yes  Financial Resource Strain: Not on file    SDOH Screenings   Alcohol Screen: Not on file  Depression (PHQ2-9): Not on file  Financial Resource Strain: Not on file  Food Insecurity: Not on file  Housing: Not on file  Physical Activity: Not on file  Social Connections: Not on file  Stress: Not on file  Tobacco Use: Medium Risk   Smoking Tobacco Use: Former   Smokeless Tobacco Use: Never   Passive Exposure: Not on file  Transportation Needs: Not on file    [place]  Allergies  Allergen Reactions   Hydromorphone Hcl Other (  Comments)  °   very agitated. With dilaudid  ° Ketoconazole Rash  °  Rash on feet with use  ° Sulfonamide Derivatives Swelling and Rash  ° ° °Medications Reviewed Today   ° ° Reviewed by Davis, Christian L, RPH (Pharmacist) on 10/04/21 at 1543  Med List Status: <None>  ° °Medication Order Taking? Sig Documenting Provider Last Dose Status Informant  °acetaminophen (TYLENOL) 650 MG CR tablet [id number] Yes Take 1,300 mg by mouth every 8 (eight) hours as needed for pain. [provider] Taking Active Spouse/Significant Other  °aspirin EC 81 MG tablet [id number] Yes Take 81 mg by mouth daily. [provider] Taking Active   °cholecalciferol (VITAMIN D3) 25 MCG (1000 UNIT) tablet [id number] Yes Take 2,000 Units by mouth daily. [provider] Taking Active   °CREAM BASE EX [id number] Yes Apply 1 application topically at bedtime. Diabetics' Dry Skin Relief (applied to feet) [provider] Taking Active Spouse/Significant Other  °dutasteride (AVODART) 0.5 MG capsule [id number] Yes TAKE 1 CAPSULE DAILY Hunter, Stephen O, MD Taking Active   °escitalopram (LEXAPRO) 10 MG tablet [id number] Yes TAKE 1 TABLET DAILY Smith, Zachary M, DO Taking Active   °FREESTYLE  INSULINX TEST test strip [id number] Yes USE TO CHECK BLOOD SUGAR DAILY AND AS NEEDED DIAG CODE E11.9 Hunter, Stephen O, MD Taking Active   °Lancets (FREESTYLE) lancets [id number] Yes E11.9 -CHECK BLOOD SUGAR ONCE DAILY Hunter, Stephen O, MD Taking Active   °Naphazoline-Pheniramine (OPCON-A OP) [id number] Yes Apply 1 drop to eye daily as needed (allergies). [provider] Taking Active Spouse/Significant Other  °nitroGLYCERIN (NITROSTAT) 0.4 MG SL tablet [id number] Yes Place 1 tablet (0.4 mg total) under the tongue every 5 (five) minutes as needed for chest pain (for chest pain). Use up to 3 dosages, if no relief call 911. Nishan, Peter C, MD Taking Active   °Omega-3 Fatty Acids (FISH OIL) 1200 MG CAPS [id number] Yes Take 2 capsules by mouth 2 (two) times daily. [provider] Taking Active   °rosuvastatin (CRESTOR) 10 MG tablet [id number] Yes TAKE 1 TABLET DAILY Hunter, Stephen O, MD Taking Active   °sildenafil (REVATIO) 20 MG tablet [id number] Yes Take 1 tablet daily PRN for erectile dysfunciton.Take 1 tablet daily PRN for erectile dysfunciton. Hunter, Stephen O, MD Taking Active   °tamsulosin (FLOMAX) 0.4 MG CAPS capsule [id number] Yes TAKE 1 CAPSULE DAILY Hunter, Stephen O, MD Taking Active   °XARELTO 20 MG TABS tablet [id number] Yes TAKE 1 TABLET DAILY WITH   SUPPER Hunter, Stephen O, MD Taking Active   ° °  °  ° °  ° ° °Patient Active Problem List  ° Diagnosis Date Noted  ° Bilateral wrist pain 05/18/2021  ° Arthritis of carpometacarpal (CMC) joint of both thumbs 05/03/2021  ° Left rotator cuff tear 12/09/2020  ° Degenerative disc disease, lumbar 07/21/2020  ° Nonallopathic lesion of sacral region 12/17/2019  ° Nonallopathic lesion of lumbosacral region 12/17/2019  ° Spasm of left piriformis muscle 06/27/2019  ° Degenerative disc disease, cervical 05/28/2019  ° Nonallopathic lesion of thoracic region 05/28/2019  ° Thrombocytopenia (HCC) 12/04/2016  ° Abnormal nuclear stress test 10/26/2016  °  Preoperative cardiovascular examination 10/26/2016  ° CAD S/P percutaneous coronary angioplasty 10/26/2016  ° Diverticulitis of colon 11/22/2015  ° Nephrolithiasis 05/07/2014  ° Gout attack 12/14/2012  ° Type 2 diabetes mellitus with CAD(HCC) 12/14/2012  ° Hematuria 03/28/2012  ° Atrial flutter (HCC) 10/03/2011  ° SINUS BRADYCARDIA 07/20/2010  ° BPH associated with nocturia 06/08/2009  °   RBBB 12/28/2008   GERD 12/15/2008   DIZZINESS 12/15/2008   CAD (coronary artery disease) 12/17/2007   LOSS, CONDUCTIVE HEARING, COMBINED TYPE 04/16/2007   Hyperlipidemia 04/02/2007   Essential hypertension 04/02/2007   Allergic rhinitis 04/02/2007   OA (osteoarthritis) of knee 04/02/2007    Immunization History  Administered Date(s) Administered   Fluad Quad(high Dose 65+) 05/24/2019   Influenza Split 05/28/2012   Influenza Whole 06/08/2009   Influenza, High Dose Seasonal PF 06/27/2013, 06/06/2016, 06/05/2017, 06/07/2018   Influenza,inj,Quad PF,6+ Mos 05/07/2014, 05/24/2015   PFIZER(Purple Top)SARS-COV-2 Vaccination 10/04/2019, 10/25/2019, 06/15/2020   Pneumococcal Conjugate-13 11/03/2013, 05/07/2014   Pneumococcal Polysaccharide-23 05/24/2015   Pneumococcal-Unspecified 06/20/1982   Td 09/11/2002   Tdap 10/25/2011    Conditions to be addressed/monitored:  DM, Afib  Care Plan : General Pharmacy (Adult)  Updates made by Edythe Clarity, RPH since 10/04/2021 12:00 AM     Problem: Dm, Afib   Priority: High  Onset Date: 03/30/2021     Long-Range Goal: Patient-Specific Goal   Start Date: 03/29/2021  Expected End Date: 09/30/2021  Recent Progress: On track  Priority: High  Note:   Current Barriers:  Inaccurate meter  Pharmacist Clinical Goal(s):  Patient will verbalize ability to afford treatment regimen achieve adherence to monitoring guidelines and medication adherence to achieve therapeutic efficacy contact provider office for questions/concerns as evidenced notation of same in electronic  health record through collaboration with PharmD and provider.   Interventions: 1:1 collaboration with [name], MD regarding development and update of comprehensive plan of care as evidenced by provider attestation and co-signature Inter-disciplinary care team collaboration (see longitudinal plan of care) Comprehensive medication review performed; medication list updated in electronic medical record  Diabetes (A1c goal <7%) -Controlled -Current medications: None -Medications previously tried: metformin  -Current home glucose readings fasting glucose: not monitoring post prandial glucose: not monitoring -Denies hypoglycemic/hyperglycemic symptoms -Educated on A1c and blood sugar goals; Complications of diabetes including kidney damage, retinal damage, and cardiovascular disease; Exercise goal of 150 minutes per week; -Counseled to check feet daily and get yearly eye exams -Recommended continue current management -Patient reports some injections that have helped pain in shoulders all the way through the arms, they did spike sugars up in the 200s, however they have since started to come down. Suggested they continue to watch  and that the short spikes are OK, especially if quality of life for patient with pain is improving.  Update 10/04/21 Home blood sugar readings have been in the 130s per patient He is no longer getting injections in shoulder, this has helped glucose come down. He reports he has lost a little bit of weight lately by limiting his snacking in between meals. I am fine with reported glucose readings, just continue to monitor at this time. No changes needed  Atrial Fibrillation (Goal: prevent stroke and major bleeding) -Controlled -CHADSVASC: 5 -Current treatment: Rate control: None Anticoagulation: Xarelto 64m daily  Appropriate, Effective, Safe, Accessible -Medications previously tried: Carvedilol -Denies any abnormal bleeding -Counseled on bleeding risk  associated with Xarelto and importance of self-monitoring for signs/symptoms of bleeding; avoidance of NSAIDs due to increased bleeding risk with anticoagulants; -Recommended to continue current medication Assessed patient finances. They never sent in application for Xarelto PAP, due to the amount of information it requires.  Currently ok with copay and no lapses in med administration.  Update 10/04/21 Xarelto is currently affordable, he does enter the donut hole towards end of year but there are no concerns about access at  this time. °He is adherent to medication and no concerns at this time. °Continue as previous no changes at this time. ° °Medication Management  °-Patient was out of Lexapro, sent refill request to Dr. Zachary Smith's nurse for refill.  Do not want patient to have to go without.  Requested send to local [facility] so they do not have to wait on mail order delivery. ° ° °Patient Goals/Self-Care Activities °Patient will:  °- take medications as prescribed °focus on medication adherence by pill box °check glucose weekly, document, and provide at future appointments ° °Follow Up Plan: The care management team will reach out to the patient again over the next 180 days.  ° °  ° °  ° °  ° °Medication Assistance: None required.  Patient affirms current coverage meets needs. ° °Compliance/Adherence/Medication fill history: °Care Gaps: °Urine Microalbumin ° °Star-Rating Drugs: °Rosuvastatin 10mg 03/06/21 90ds ° °Patient's preferred pharmacy is: ° °[facility] - [place], [place] - [address] AT Portal to Registered [facility] Sites °[address] °[place] [address] °Phone: [phone] Fax: [phone] ° °[facility] - [place], [place] - [address] °[address] °[address] °[place] [address] °Phone: [phone] Fax: [phone] ° ° °Uses pill box? Yes °Pt endorses 100% compliance ° °We discussed: Benefits of medication  synchronization, packaging and delivery as well as enhanced pharmacist oversight with [employer]. °Patient decided to: Continue current medication management strategy ° °Care Plan and Follow Up Patient Decision:  Patient agrees to Care Plan and Follow-up. ° °Plan: The care management team will reach out to the patient again over the next 180 days. ° °Christian Davis, PharmD °Clinical Pharmacist °[phone] ° ° ° ° ° ° ° °

## 2021-09-21 NOTE — Assessment & Plan Note (Signed)
No significant arthritic changes of multiple joints.  Patient continues to have some discomfort overall.  We have tried different medications.  Patient does like the Lexapro but did seem to have better pain improvement with the Cymbalta.  Unfortunately had other side effects.  Discussed with patient continue to stay active as possible.  Manipulation does seem to be helpful.  Can continue to follow-up every 6 weeks

## 2021-09-21 NOTE — Patient Instructions (Signed)
See me again in 6 weeks 

## 2021-10-04 ENCOUNTER — Other Ambulatory Visit: Payer: Self-pay

## 2021-10-04 ENCOUNTER — Ambulatory Visit (INDEPENDENT_AMBULATORY_CARE_PROVIDER_SITE_OTHER): Payer: Medicare Other | Admitting: Cardiovascular Disease

## 2021-10-04 ENCOUNTER — Ambulatory Visit (INDEPENDENT_AMBULATORY_CARE_PROVIDER_SITE_OTHER): Payer: Medicare Other | Admitting: Pharmacist

## 2021-10-04 DIAGNOSIS — I4510 Unspecified right bundle-branch block: Secondary | ICD-10-CM

## 2021-10-04 DIAGNOSIS — Z7901 Long term (current) use of anticoagulants: Secondary | ICD-10-CM

## 2021-10-04 DIAGNOSIS — Z5181 Encounter for therapeutic drug level monitoring: Secondary | ICD-10-CM

## 2021-10-04 DIAGNOSIS — I10 Essential (primary) hypertension: Secondary | ICD-10-CM

## 2021-10-04 DIAGNOSIS — E1159 Type 2 diabetes mellitus with other circulatory complications: Secondary | ICD-10-CM

## 2021-10-04 DIAGNOSIS — I480 Paroxysmal atrial fibrillation: Secondary | ICD-10-CM | POA: Diagnosis not present

## 2021-10-04 DIAGNOSIS — I4892 Unspecified atrial flutter: Secondary | ICD-10-CM

## 2021-10-04 DIAGNOSIS — I2510 Atherosclerotic heart disease of native coronary artery without angina pectoris: Secondary | ICD-10-CM | POA: Diagnosis not present

## 2021-10-04 NOTE — Patient Instructions (Signed)
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
## 2021-10-04 NOTE — Patient Instructions (Addendum)
Visit Information   Goals Addressed             This Visit's Progress    Manage My Medicine   On track    Timeframe:  Long-Range Goal Priority:  High Start Date: 03/29/21                             Expected End Date:  09/3021                     Follow Up Date 07/11/21    Ensure no gaps in medication ie Lexapro!    Why is this important?   These steps will help you keep on track with your medicines.   Notes:        Patient Care Plan: General Pharmacy (Adult)     Problem Identified: Dm, Afib   Priority: High  Onset Date: 03/30/2021     Long-Range Goal: Patient-Specific Goal   Start Date: 03/29/2021  Expected End Date: 09/30/2021  Recent Progress: On track  Priority: High  Note:   Current Barriers:  Inaccurate meter  Pharmacist Clinical Goal(s):  Patient will verbalize ability to afford treatment regimen achieve adherence to monitoring guidelines and medication adherence to achieve therapeutic efficacy contact provider office for questions/concerns as evidenced notation of same in electronic health record through collaboration with PharmD and provider.   Interventions: 1:1 collaboration with [name], MD regarding development and update of comprehensive plan of care as evidenced by provider attestation and co-signature Inter-disciplinary care team collaboration (see longitudinal plan of care) Comprehensive medication review performed; medication list updated in electronic medical record  Diabetes (A1c goal <7%) -Controlled -Current medications: None -Medications previously tried: metformin  -Current home glucose readings fasting glucose: not monitoring post prandial glucose: not monitoring -Denies hypoglycemic/hyperglycemic symptoms -Educated on A1c and blood sugar goals; Complications of diabetes including kidney damage, retinal damage, and cardiovascular disease; Exercise goal of 150 minutes per week; -Counseled to check feet daily and get  yearly eye exams -Recommended continue current management -Patient reports some injections that have helped pain in shoulders all the way through the arms, they did spike sugars up in the 200s, however they have since started to come down. Suggested they continue to watch  and that the short spikes are OK, especially if quality of life for patient with pain is improving.  Update 10/04/21 Home blood sugar readings have been in the 130s per patient He is no longer getting injections in shoulder, this has helped glucose come down. He reports he has lost a little bit of weight lately by limiting his snacking in between meals. I am fine with reported glucose readings, just continue to monitor at this time. No changes needed  Atrial Fibrillation (Goal: prevent stroke and major bleeding) -Controlled -CHADSVASC: 5 -Current treatment: Rate control: None Anticoagulation: Xarelto 20mg  daily  Appropriate, Effective, Safe, Accessible -Medications previously tried: Carvedilol -Denies any abnormal bleeding -Counseled on bleeding risk associated with Xarelto and importance of self-monitoring for signs/symptoms of bleeding; avoidance of NSAIDs due to increased bleeding risk with anticoagulants; -Recommended to continue current medication Assessed patient finances. They never sent in application for Xarelto PAP, due to the amount of information it requires.  Currently ok with copay and no lapses in med administration.  Update 10/04/21 Xarelto is currently affordable, he does enter the donut hole towards end of year but there are no concerns about access at this time. He  is adherent to medication and no concerns at this time. Continue as previous no changes at this time.  Medication Management  -Patient was out of Lexapro, sent refill request to Dr. Genevie Ann nurse for refill.  Do not want patient to have to go without.  [facility]ed [facility] so they do not have to wait on mail order  delivery.   Patient Goals/Self-Care Activities Patient will:  - take medications as prescribed focus on medication adherence by pill box check glucose weekly, document, and provide at future appointments  Follow Up Plan: The care management team will reach out to the patient again over the next 180 days.           Patient verbalizes understanding of instructions and care plan provided today and agrees to view in [place]. Active MyChart status confirmed with patient.   Telephone follow up appointment with pharmacy team member scheduled for: 6 months  Edythe Clarity, [place], PharmD Clinical Pharmacist  [facility] [phone]

## 2021-10-05 ENCOUNTER — Other Ambulatory Visit: Payer: Self-pay | Admitting: *Deleted

## 2021-10-05 ENCOUNTER — Encounter: Payer: Self-pay | Admitting: *Deleted

## 2021-10-05 MED ORDER — BLOOD GLUCOSE MONITOR KIT: PACK | 0 refills | Status: DC

## 2021-10-06 ENCOUNTER — Telehealth: Payer: Self-pay

## 2021-10-06 ENCOUNTER — Ambulatory Visit: Payer: Medicare Other | Admitting: Cardiovascular Disease

## 2021-10-06 MED ORDER — ACCU-CHEK AVIVA PLUS W/DEVICE KIT: 1.0000 | PACK | Freq: Four times a day (QID) | 0 refills | Status: DC

## 2021-10-06 NOTE — Telephone Encounter (Signed)
-----   Message from Edythe Clarity, [facility] sent at 10/06/2021 11:16 AM EST ----- Looks like AccuChek is what is currently preferred under [employer] Part D!  Thanks Beverly Milch, PharmD Clinical Pharmacist  [place] [phone]  ----- Message ----- From: [name], MD Sent: 10/05/2021   1:53 PM EST To: Edythe Clarity, RPH, [employer] [employer]  I am fine with any different meter being sent in along with strips and lancets- just let my team know what preferred option is for his pharmacy/insurance and they can send in  ----- Message ----- From: Zacarias Pontes, CMA Sent: 10/05/2021   7:49 AM EST To: [name], MD   ----- Message ----- From: Edythe Clarity, [facility] Sent: 10/04/2021   4:48 PM EST To: [facility] [employer]  Patient requesting rx for new meter and test strips[facility]ets [facility] [address].  The insulinx is no longer being made per their pharmacist so they are wanting to switch to a different one.  Beverly Milch, PharmD Clinical Pharmacist  [facility] [phone]

## 2021-10-06 NOTE — Telephone Encounter (Signed)
New rx sent

## 2021-10-11 DIAGNOSIS — I4891 Unspecified atrial fibrillation: Secondary | ICD-10-CM | POA: Diagnosis not present

## 2021-10-11 DIAGNOSIS — E1159 Type 2 diabetes mellitus with other circulatory complications: Secondary | ICD-10-CM | POA: Diagnosis not present

## 2021-10-12 ENCOUNTER — Other Ambulatory Visit: Payer: Self-pay | Admitting: Family Medicine

## 2021-10-12 NOTE — Telephone Encounter (Signed)
Yes may change to once daily-if he is not using the freestyle which is also listed may discontinue that prescription

## 2021-10-12 NOTE — Progress Notes (Signed)
Phone [phone] In person visit   Subjective:   Sean Lane is a 88 y.o. year old very pleasant male patient who presents for/with See problem oriented charting Chief Complaint  Patient presents with   Follow-up   Diabetes   Hyperlipidemia   Hypertension    This visit occurred during the SARS-CoV-2 public health emergency.  Safety protocols were in place, including screening questions prior to the visit, additional usage of staff PPE, and extensive cleaning of exam room while observing appropriate contact time as indicated for disinfecting solutions.   Past Medical History-  Patient Active Problem List   Diagnosis Date Noted   Type 2 diabetes mellitus with CAD(HCC) 12/14/2012    Priority: High   Atrial flutter ([place]) 10/03/2011    Priority: High   CAD (coronary artery disease) 12/17/2007    Priority: High   SINUS BRADYCARDIA 07/20/2010    Priority: Medium    BPH associated with nocturia 06/08/2009    Priority: Medium    DIZZINESS 12/15/2008    Priority: Medium    Hyperlipidemia 04/02/2007    Priority: Medium    Essential hypertension 04/02/2007    Priority: Medium    Nephrolithiasis 05/07/2014    Priority: Low   Gout attack 12/14/2012    Priority: Low   Hematuria 03/28/2012    Priority: Low   RBBB 12/28/2008    Priority: Low   GERD 12/15/2008    Priority: Low   LOSS, CONDUCTIVE HEARING, COMBINED TYPE 04/16/2007    Priority: Low   Allergic rhinitis 04/02/2007    Priority: Low   OA (osteoarthritis) of knee 04/02/2007    Priority: Low   Bilateral wrist pain 05/18/2021   Arthritis of carpometacarpal (CMC) joint of both thumbs 05/03/2021   Left rotator cuff tear 12/09/2020   Degenerative disc disease, lumbar 07/21/2020   Nonallopathic lesion of sacral region 12/17/2019   Nonallopathic lesion of lumbosacral region 12/17/2019   Spasm of left piriformis muscle 06/27/2019   Degenerative disc disease, cervical 05/28/2019   Nonallopathic lesion of thoracic  region 05/28/2019   Thrombocytopenia ([place]) 12/04/2016   Abnormal nuclear stress test 10/26/2016   Preoperative cardiovascular examination 10/26/2016   CAD S/P percutaneous coronary angioplasty 10/26/2016   Diverticulitis of colon 11/22/2015    Medications- reviewed and updated Current Outpatient Medications  Medication Sig Dispense Refill   acetaminophen (TYLENOL) 650 MG CR tablet Take 1,300 mg by mouth every 8 (eight) hours as needed for pain.     aspirin EC 81 MG tablet Take 81 mg by mouth daily.     Blood Glucose Monitoring Suppl (ACCU-CHEK GUIDE ME) w/Device KIT Use to test blood sugars once daily. Dx: E11.9 1 kit 3   cholecalciferol (VITAMIN D3) 25 MCG (1000 UNIT) tablet Take 2,000 Units by mouth daily.     CREAM BASE EX Apply 1 application topically at bedtime. Diabetics' Dry Skin Relief (applied to feet)     dutasteride (AVODART) 0.5 MG capsule TAKE 1 CAPSULE DAILY 90 capsule 3   escitalopram (LEXAPRO) 10 MG tablet TAKE 1 TABLET DAILY 90 tablet 0   Naphazoline-Pheniramine (OPCON-A OP) Apply 1 drop to eye daily as needed (allergies).     nitroGLYCERIN (NITROSTAT) 0.4 MG SL tablet Place 1 tablet (0.4 mg total) under the tongue every 5 (five) minutes as needed for chest pain (for chest pain). Use up to 3 dosages, if no relief call 911. 75 tablet 1   Omega-3 Fatty Acids (FISH OIL) 1200 MG CAPS Take 2 capsules by  mouth 2 (two) times daily.     rosuvastatin (CRESTOR) 10 MG tablet TAKE 1 TABLET DAILY 90 tablet 3   sildenafil (REVATIO) 20 MG tablet Take 1 tablet daily PRN for erectile dysfunciton.Take 1 tablet daily PRN for erectile dysfunciton. 30 tablet 3   tamsulosin (FLOMAX) 0.4 MG CAPS capsule TAKE 1 CAPSULE DAILY 90 capsule 1   XARELTO 20 MG TABS tablet TAKE 1 TABLET DAILY WITH   SUPPER 90 tablet 3   FREESTYLE INSULINX TEST test strip USE TO CHECK BLOOD SUGAR DAILY AND AS NEEDED DIAG CODE E11.9 100 strip 4   Lancets (FREESTYLE) lancets E11.9 -CHECK BLOOD SUGAR ONCE DAILY 100 each 4    No current facility-administered medications for this visit.     Objective:  BP 110/60    Pulse 79    Temp (!) 97.2 F (36.2 C)    Ht '6\' 1"'  (1.854 m)    Wt 184 lb 3.2 oz (83.6 kg)    SpO2 95%    BMI 24.30 kg/m  Gen: NAD, resting comfortably CV: RRR no murmurs rubs or gallops Lungs: CTAB no crackles, wheeze, rhonchi Ext: trace edema Skin: warm, dry Neuro:  hard of hearing    Assessment and Plan   # Diabetes S: Medication:diet controlled  CBGs- accuchek guide meter- trying to get set up with lancets and strips- sounds like will have to pay for a new meter- not clear why since over 5 years since picked up last one.  Lab Results  Component Value Date   HGBA1C 6.4 04/11/2021   HGBA1C 6.4 10/11/2020   HGBA1C 5.8 (A) 04/08/2020   A/P:  hopefully stable- update A1c today. Continue without meds for now   #hypertension S: medication: off meds at present Home readings #s: not checking at home BP Readings from Last 3 Encounters:  10/13/21 110/60  09/21/21 138/62  08/11/21 (!) 126/50  A/P: Remains well controlled without medicine-continue to monitor   #CAD- saw Dr. Johnsie Cancel recently  #hyperlipidemia S: Medication:rosuvastatin 10 mg daily, aspirin 81 mg daily -No chest pain or shortness of breath reported Lab Results  Component Value Date   CHOL 118 04/11/2021   HDL 44.10 04/11/2021   LDLCALC 46 04/11/2021   LDLDIRECT 54.0 06/05/2017   TRIG 141.0 04/11/2021   CHOLHDL 3 04/11/2021   A/P: CAD is asymptomatic-continue current medications.  Lipids at goal with LDL under 70-too soon for repeat  # Atrial flutter S: Rate controlled without meds Anticoagulated with xarelto 20 mg daily A/P: Appropriately anticoagulated.  Rate controlled without medication.  Overall stable-continue current medicine  #MSK-  Still working on lower back pain with Dr. Tamala Julian Shoulders calm recently Still doing stationary bike daily  #Thyroid nodule-followed by Dr. Carolee Rota in 1 year from February 18, 2021 planned  Lab Results  Component Value Date   TSH 1.18 02/23/2021    # elevated PSA- discovered by Dr. Tamala Julian- on repeat trended down but we still wanted to get urology opinion. Patient stated he had a very clear dream and was told he had cancer- he reported this started. Had been released from urology at age 57 and was told no more PSA.  -When he was seen by urology PSA was trending down-they stated PSA could be rechecked in 1 year but did not need to be done with urology unless significant worsening-PSA essentially normal for age and current prostate size - psa planned next visit and refer back only if #s trend up significantly  Lab Results  Component  Value Date   PSA 4.52 (H) 03/21/2021   PSA 5.21 (H) 02/23/2021   PSA 1.27 10/03/2017   #poor hearing- referred to audiology  Recommended follow up: No follow-ups on file. Future Appointments  Date Time Provider [place]  10/25/2021  3:30 PM [name], DPM [facility]  11/02/2021  2:45 PM [name], DO [facility] None  04/10/2022  3:45 PM [facility]CM PHARMACIST [facility]   Lab/Order associations:   ICD-10-CM   1. Hyperlipidemia, unspecified hyperlipidemia type  E78.5     2. Essential hypertension  I10     3. Atrial flutter, unspecified type ([place])  I48.92     4. Coronary artery disease involving native coronary artery of native heart without angina pectoris  I25.10     5. Type 2 diabetes mellitus with other circulatory complication, without long-term current use of insulin (HCC)  E11.59 CBC with Differential/Platelet    Comprehensive metabolic panel    Hemoglobin A1c    6. Encounter for hearing examination, unspecified whether abnormal findings  Z01.10 Ambulatory referral to Audiology    7. Thyroid nodule  E04.1 TSH      No orders of the defined types were placed in this encounter.   I,Jada Bradford,acting as a scribe for [name], MD.,have documented all relevant documentation on  the behalf of [name], MD,as directed by  [name], MD while in the presence of [name], MD.   I, [name], MD, have reviewed all documentation for this visit. The documentation on 10/13/21 for the exam, diagnosis, procedures, and orders are all accurate and complete.   Return precautions advised.  [name], MD

## 2021-10-12 NOTE — Telephone Encounter (Signed)
Ok to change to once daily instead of morning noon and pm?

## 2021-10-13 ENCOUNTER — Ambulatory Visit (INDEPENDENT_AMBULATORY_CARE_PROVIDER_SITE_OTHER): Payer: Medicare Other | Admitting: Family Medicine

## 2021-10-13 ENCOUNTER — Encounter: Payer: Self-pay | Admitting: Family Medicine

## 2021-10-13 ENCOUNTER — Other Ambulatory Visit: Payer: Self-pay

## 2021-10-13 VITALS — BP 110/60 | HR 79 | Temp 97.2°F | Ht 73.0 in | Wt 184.2 lb

## 2021-10-13 DIAGNOSIS — H9190 Unspecified hearing loss, unspecified ear: Secondary | ICD-10-CM | POA: Diagnosis not present

## 2021-10-13 DIAGNOSIS — I4892 Unspecified atrial flutter: Secondary | ICD-10-CM | POA: Diagnosis not present

## 2021-10-13 DIAGNOSIS — I2510 Atherosclerotic heart disease of native coronary artery without angina pectoris: Secondary | ICD-10-CM

## 2021-10-13 DIAGNOSIS — I10 Essential (primary) hypertension: Secondary | ICD-10-CM | POA: Diagnosis not present

## 2021-10-13 DIAGNOSIS — E041 Nontoxic single thyroid nodule: Secondary | ICD-10-CM

## 2021-10-13 DIAGNOSIS — E785 Hyperlipidemia, unspecified: Secondary | ICD-10-CM | POA: Diagnosis not present

## 2021-10-13 DIAGNOSIS — Z0110 Encounter for examination of ears and hearing without abnormal findings: Secondary | ICD-10-CM

## 2021-10-13 DIAGNOSIS — E1159 Type 2 diabetes mellitus with other circulatory complications: Secondary | ICD-10-CM | POA: Diagnosis not present

## 2021-10-13 MED ORDER — ACCU-CHEK GUIDE VI STRP: ORAL_STRIP | 3 refills | Status: DC

## 2021-10-13 MED ORDER — ACCU-CHEK GUIDE ME W/DEVICE KIT: PACK | 0 refills | Status: DC

## 2021-10-13 MED ORDER — ACCU-CHEK SOFTCLIX LANCETS MISC: 3 refills | Status: DC

## 2021-10-13 NOTE — Patient Instructions (Addendum)
Consider shingrix and covid bivalent booster at pharmacy  Please stop by lab before you go If you have mychart- we will send your results within 3 business days of [place] receiving them.  If you do not have mychart- we will call you about results within 5 business days of [place] receiving them.  *please also note that you will see labs on mychart as soon as they post. I will later go in and write notes on them- will say "notes from Dr. Yong Channel"   We will call you within two weeks about your referral to audiology. If you do not hear within 2 weeks, give [place] a call.    Recommended follow up: Return in about 6 months (around 04/12/2022) for follow up- or sooner if needed.

## 2021-10-14 ENCOUNTER — Encounter: Payer: Self-pay | Admitting: Family Medicine

## 2021-10-14 LAB — CBC WITH DIFFERENTIAL/PLATELET
Basophils Absolute: 0.1 10*3/uL (ref 0.0–0.1)
Basophils Relative: 1.2 % (ref 0.0–3.0)
Eosinophils Absolute: 0.1 10*3/uL (ref 0.0–0.7)
Eosinophils Relative: 1.7 % (ref 0.0–5.0)
HCT: 37.8 % — ABNORMAL LOW (ref 39.0–52.0)
Hemoglobin: 12.3 g/dL — ABNORMAL LOW (ref 13.0–17.0)
Lymphocytes Relative: 23.9 % (ref 12.0–46.0)
Lymphs Abs: 1 10*3/uL (ref 0.7–4.0)
MCHC: 32.6 g/dL (ref 30.0–36.0)
MCV: 85.8 fl (ref 78.0–100.0)
Monocytes Absolute: 0.3 10*3/uL (ref 0.1–1.0)
Monocytes Relative: 7.1 % (ref 3.0–12.0)
Neutro Abs: 2.8 10*3/uL (ref 1.4–7.7)
Neutrophils Relative %: 66.1 % (ref 43.0–77.0)
Platelets: 209 10*3/uL (ref 150.0–400.0)
RBC: 4.41 Mil/uL (ref 4.22–5.81)
RDW: 17.6 % — ABNORMAL HIGH (ref 11.5–15.5)
WBC: 4.2 10*3/uL (ref 4.0–10.5)

## 2021-10-14 LAB — COMPREHENSIVE METABOLIC PANEL
ALT: 9 U/L (ref 0–53)
AST: 15 U/L (ref 0–37)
Albumin: 4.2 g/dL (ref 3.5–5.2)
Alkaline Phosphatase: 79 U/L (ref 39–117)
BUN: 20 mg/dL (ref 6–23)
CO2: 29 mEq/L (ref 19–32)
Calcium: 10 mg/dL (ref 8.4–10.5)
Chloride: 104 mEq/L (ref 96–112)
Creatinine, Ser: 1.04 mg/dL (ref 0.40–1.50)
GFR: 62.86 mL/min (ref >60.00)
Glucose, Bld: 150 mg/dL — ABNORMAL HIGH (ref 70–99)
Potassium: 4.1 mEq/L (ref 3.5–5.1)
Sodium: 140 mEq/L (ref 135–145)
Total Bilirubin: 0.5 mg/dL (ref 0.2–1.2)
Total Protein: 6.5 g/dL (ref 6.0–8.3)

## 2021-10-14 LAB — TSH: TSH: 0.89 u[IU]/mL (ref 0.35–5.50)

## 2021-10-14 LAB — HEMOGLOBIN A1C: Hgb A1c MFr Bld: 6.3 % (ref 4.6–6.5)

## 2021-10-18 ENCOUNTER — Ambulatory Visit: Payer: Medicare Other | Admitting: Cardiovascular Disease

## 2021-10-25 ENCOUNTER — Ambulatory Visit (INDEPENDENT_AMBULATORY_CARE_PROVIDER_SITE_OTHER): Payer: Medicare Other | Admitting: Podiatry

## 2021-10-25 ENCOUNTER — Other Ambulatory Visit: Payer: Self-pay

## 2021-10-25 ENCOUNTER — Encounter: Payer: Self-pay | Admitting: Podiatry

## 2021-10-25 DIAGNOSIS — E1151 Type 2 diabetes mellitus with diabetic peripheral angiopathy without gangrene: Secondary | ICD-10-CM

## 2021-10-25 DIAGNOSIS — B351 Tinea unguium: Secondary | ICD-10-CM

## 2021-10-25 DIAGNOSIS — D689 Coagulation defect, unspecified: Secondary | ICD-10-CM

## 2021-10-25 DIAGNOSIS — M79674 Pain in right toe(s): Secondary | ICD-10-CM

## 2021-10-25 DIAGNOSIS — M79675 Pain in left toe(s): Secondary | ICD-10-CM | POA: Diagnosis not present

## 2021-10-25 NOTE — Progress Notes (Signed)
This patient returns to my office for at risk foot care.  This patient requires this care by a professional since this patient will be at risk due to having type 2 diabetes and coagulation defect.  Patient is taking xarelto.  This patient presents to the office with his wife.  This patient is unable to cut nails himself since the patient cannot reach his nails.These nails are painful walking and wearing shoes.  This patient presents for at risk foot care today.  General Appearance  Alert, conversant and in no acute stress.  Vascular  Dorsalis pedis   pulses are palpable  bilaterally. Posterior tibial pulses are absent  Bilaterally. Capillary return is within normal limits  bilaterally. Temperature is within normal limits  bilaterally.  Neurologic  Senn-Weinstein monofilament wire test diminished   bilaterally. Muscle power within normal limits bilaterally.  Nails Thick disfigured discolored nails with subungual debris  from hallux to fifth toes bilaterally. Hammer toes 2-4  B/L.  Orthopedic  No limitations of motion  feet .  No crepitus or effusions noted.  No bony pathology or digital deformities noted.  Skin  normotropic skin with no porokeratosis noted bilaterally.  No signs of infections or ulcers noted.     Onychomycosis  Pain in right toes  Pain in left toes  Consent was obtained for treatment procedures.   Mechanical debridement of nails 1-5  bilaterally performed with a nail nipper.  Filed with dremel without incident.    Return office visit   10 weeks                 Told patient to return for periodic foot care and evaluation due to potential at risk complications.   Brice Kossman DPM  

## 2021-10-31 DIAGNOSIS — H903 Sensorineural hearing loss, bilateral: Secondary | ICD-10-CM | POA: Diagnosis not present
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
## 2021-11-02 ENCOUNTER — Encounter: Payer: Self-pay | Admitting: Family Medicine

## 2021-11-02 ENCOUNTER — Ambulatory Visit (INDEPENDENT_AMBULATORY_CARE_PROVIDER_SITE_OTHER): Payer: Medicare Other | Admitting: Family Medicine

## 2021-11-02 ENCOUNTER — Other Ambulatory Visit: Payer: Self-pay

## 2021-11-02 VITALS — BP 128/72 | HR 67 | Ht 73.0 in | Wt 186.0 lb

## 2021-11-02 VITALS — BP 138/62 | HR 68 | Temp 98.0°F | Ht 73.0 in | Wt 187.2 lb

## 2021-11-02 DIAGNOSIS — H6121 Impacted cerumen, right ear: Secondary | ICD-10-CM

## 2021-11-02 DIAGNOSIS — M9903 Segmental and somatic dysfunction of lumbar region: Secondary | ICD-10-CM

## 2021-11-02 DIAGNOSIS — M5030 Other cervical disc degeneration, unspecified cervical region: Secondary | ICD-10-CM | POA: Diagnosis not present

## 2021-11-02 DIAGNOSIS — M9902 Segmental and somatic dysfunction of thoracic region: Secondary | ICD-10-CM

## 2021-11-02 DIAGNOSIS — M9908 Segmental and somatic dysfunction of rib cage: Secondary | ICD-10-CM | POA: Diagnosis not present

## 2021-11-02 DIAGNOSIS — M5136 Other intervertebral disc degeneration, lumbar region: Secondary | ICD-10-CM | POA: Diagnosis not present

## 2021-11-02 DIAGNOSIS — M9901 Segmental and somatic dysfunction of cervical region: Secondary | ICD-10-CM | POA: Diagnosis not present

## 2021-11-02 DIAGNOSIS — I2510 Atherosclerotic heart disease of native coronary artery without angina pectoris: Secondary | ICD-10-CM

## 2021-11-02 DIAGNOSIS — M9904 Segmental and somatic dysfunction of sacral region: Secondary | ICD-10-CM | POA: Diagnosis not present

## 2021-11-02 NOTE — Patient Instructions (Signed)
It was very nice to see you today!  Hope this helps.   PLEASE NOTE:  If you had any lab tests please let us know if you have not heard back within a few days. You may see your results on MyChart before we have a chance to review them but we will give you a call once they are reviewed by [place]. If we ordered any referrals today, please let us know if you have not heard from their office within the next week.
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
## 2021-11-02 NOTE — Assessment & Plan Note (Signed)
Chronic problems noted.  Continues to have some exacerbation.  Likely no radicular symptoms.  Responding well to the manipulation.  No change in other management.  Doing well with the Lexapro it seems.  Follow-up again in 4 to 8 weeks

## 2021-11-02 NOTE — Patient Instructions (Signed)
Great to see you See me in 6 weeks

## 2021-11-11 DIAGNOSIS — E119 Type 2 diabetes mellitus without complications: Secondary | ICD-10-CM | POA: Diagnosis not present

## 2021-11-11 DIAGNOSIS — Z961 Presence of intraocular lens: Secondary | ICD-10-CM | POA: Diagnosis not present

## 2021-11-11 LAB — HM DIABETES EYE EXAM

## 2021-11-22 ENCOUNTER — Other Ambulatory Visit: Payer: Self-pay | Admitting: Family Medicine

## 2021-11-23 ENCOUNTER — Other Ambulatory Visit: Payer: Self-pay

## 2021-11-23 ENCOUNTER — Ambulatory Visit (INDEPENDENT_AMBULATORY_CARE_PROVIDER_SITE_OTHER): Payer: Medicare Other | Admitting: Dermatology

## 2021-11-23 ENCOUNTER — Encounter: Payer: Self-pay | Admitting: Dermatology

## 2021-11-23 DIAGNOSIS — I2510 Atherosclerotic heart disease of native coronary artery without angina pectoris: Secondary | ICD-10-CM | POA: Diagnosis not present

## 2021-11-23 DIAGNOSIS — L219 Seborrheic dermatitis, unspecified: Secondary | ICD-10-CM | POA: Diagnosis not present

## 2021-11-23 DIAGNOSIS — L309 Dermatitis, unspecified: Secondary | ICD-10-CM | POA: Diagnosis not present

## 2021-11-23 MED ORDER — HYDROCORTISONE 2.5 % EX CREA: TOPICAL_CREAM | Freq: Every evening | CUTANEOUS | 3 refills | Status: DC

## 2021-11-23 MED ORDER — HYDROCORTISONE 2.5 % EX OINT: TOPICAL_OINTMENT | Freq: Two times a day (BID) | CUTANEOUS | 6 refills | Status: DC

## 2021-11-23 MED ORDER — CLOBETASOL PROPIONATE 0.05 % EX FOAM: Freq: Every day | CUTANEOUS | 3 refills | Status: DC

## 2021-11-24 ENCOUNTER — Telehealth: Payer: Self-pay | Admitting: Dermatology

## 2021-11-24 NOTE — Telephone Encounter (Signed)
Patient's wife wanted to know where he's suppose to apply the foam and the ointment. I informed patient's wife on where he's to use the medications.  ?

## 2021-11-24 NOTE — Telephone Encounter (Signed)
Here yesterday;wife says he has extreme hearing loss. She was unable to come with  him yesterday and has questions about the meds. ?

## 2021-12-04 ENCOUNTER — Encounter: Payer: Self-pay | Admitting: Dermatology

## 2021-12-04 ENCOUNTER — Emergency Department (HOSPITAL_BASED_OUTPATIENT_CLINIC_OR_DEPARTMENT_OTHER): Payer: Medicare Other

## 2021-12-04 ENCOUNTER — Encounter (HOSPITAL_BASED_OUTPATIENT_CLINIC_OR_DEPARTMENT_OTHER): Payer: Self-pay | Admitting: Obstetrics and Gynecology

## 2021-12-04 ENCOUNTER — Emergency Department (HOSPITAL_BASED_OUTPATIENT_CLINIC_OR_DEPARTMENT_OTHER)
Admission: EM | Admit: 2021-12-04 | Discharge: 2021-12-04 | Disposition: A | Discharge status: Home / Self Care | Payer: Medicare Other | Attending: Emergency Medicine | Admitting: Emergency Medicine

## 2021-12-04 ENCOUNTER — Other Ambulatory Visit: Payer: Self-pay

## 2021-12-04 DIAGNOSIS — M25512 Pain in left shoulder: Secondary | ICD-10-CM | POA: Insufficient documentation

## 2021-12-04 DIAGNOSIS — W01198A Fall on same level from slipping, tripping and stumbling with subsequent striking against other object, initial encounter: Secondary | ICD-10-CM | POA: Insufficient documentation

## 2021-12-04 DIAGNOSIS — M25511 Pain in right shoulder: Secondary | ICD-10-CM | POA: Insufficient documentation

## 2021-12-04 DIAGNOSIS — Y990 Civilian activity done for income or pay: Secondary | ICD-10-CM | POA: Diagnosis not present

## 2021-12-04 DIAGNOSIS — E119 Type 2 diabetes mellitus without complications: Secondary | ICD-10-CM | POA: Diagnosis not present

## 2021-12-04 DIAGNOSIS — I10 Essential (primary) hypertension: Secondary | ICD-10-CM | POA: Diagnosis not present

## 2021-12-04 DIAGNOSIS — S0081XA Abrasion of other part of head, initial encounter: Secondary | ICD-10-CM | POA: Diagnosis not present

## 2021-12-04 DIAGNOSIS — S50812A Abrasion of left forearm, initial encounter: Secondary | ICD-10-CM | POA: Insufficient documentation

## 2021-12-04 DIAGNOSIS — M25522 Pain in left elbow: Secondary | ICD-10-CM | POA: Diagnosis not present

## 2021-12-04 DIAGNOSIS — S80812A Abrasion, left lower leg, initial encounter: Secondary | ICD-10-CM | POA: Insufficient documentation

## 2021-12-04 DIAGNOSIS — S50312A Abrasion of left elbow, initial encounter: Secondary | ICD-10-CM | POA: Diagnosis not present

## 2021-12-04 DIAGNOSIS — T07XXXA Unspecified multiple injuries, initial encounter: Secondary | ICD-10-CM

## 2021-12-04 DIAGNOSIS — Z23 Encounter for immunization: Secondary | ICD-10-CM | POA: Diagnosis not present

## 2021-12-04 DIAGNOSIS — W19XXXA Unspecified fall, initial encounter: Secondary | ICD-10-CM

## 2021-12-04 DIAGNOSIS — S0990XA Unspecified injury of head, initial encounter: Secondary | ICD-10-CM

## 2021-12-04 DIAGNOSIS — S50311A Abrasion of right elbow, initial encounter: Secondary | ICD-10-CM | POA: Diagnosis not present

## 2021-12-04 IMAGING — CT CT HEAD W/O CM
4 series · 16 of 47 positions shown, 18 images · non-contrast
Comparison: None.

CLINICAL DATA: Fall while working outside yesterday. Blunt head
trauma. On blood thinner.



[Series 2: head wo · axial · 0.48mm/px · z∈[+1007,+1127]mm · 7 of 32 slices shown, 9 images]
[im 4/32  brain]
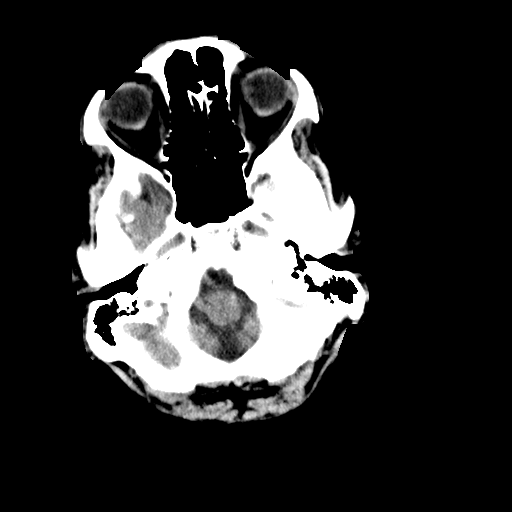
[im 4/32  bone]
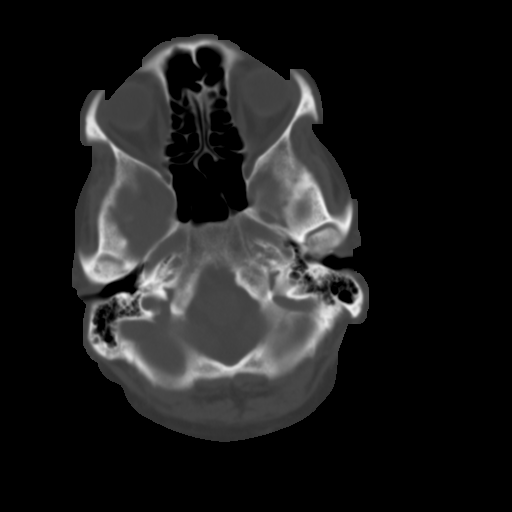
[im 8/32  brain]
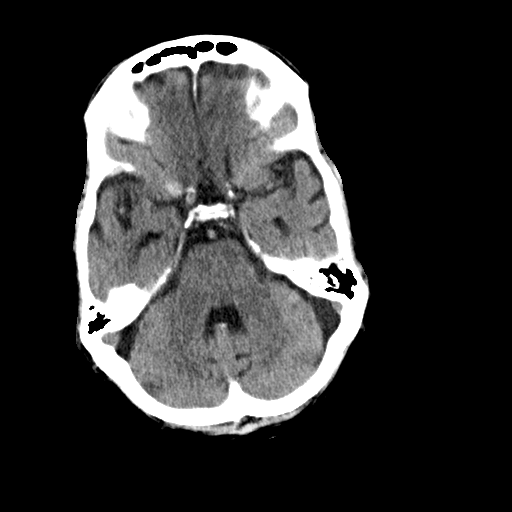
[im 12/32  brain]
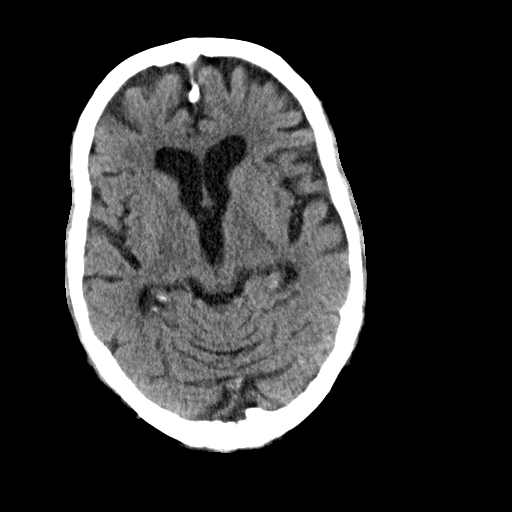
[im 16/32  brain]
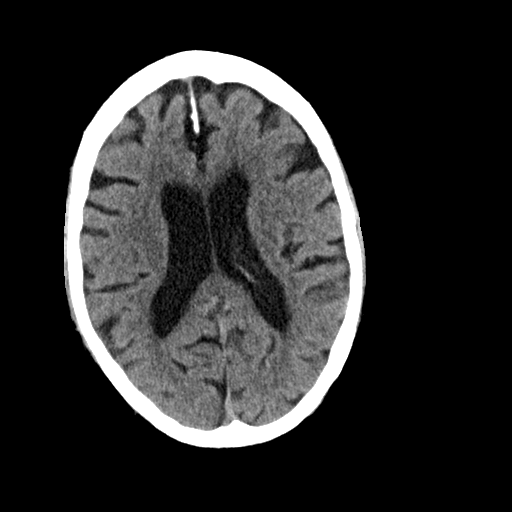
[im 20/32  brain]
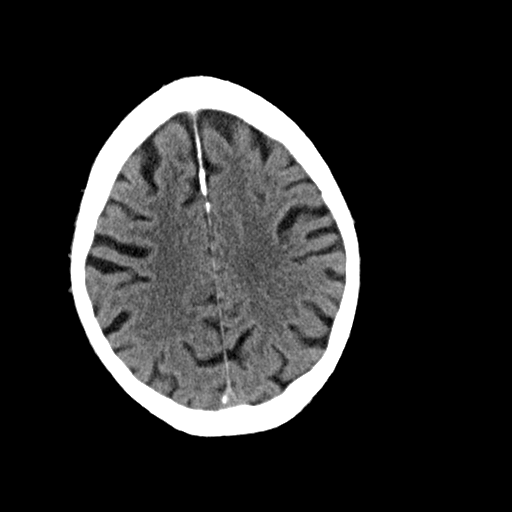
[im 20/32  bone]
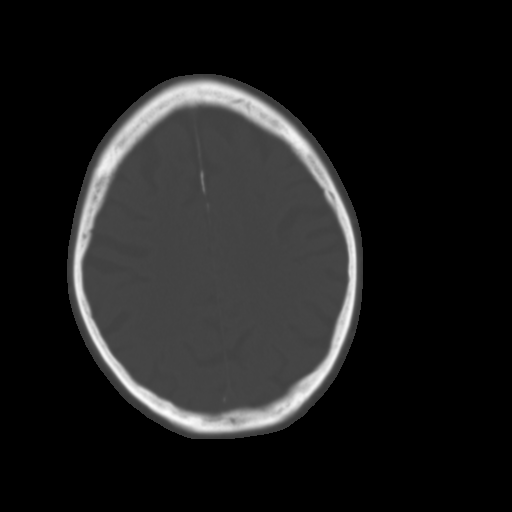
[im 24/32  brain]
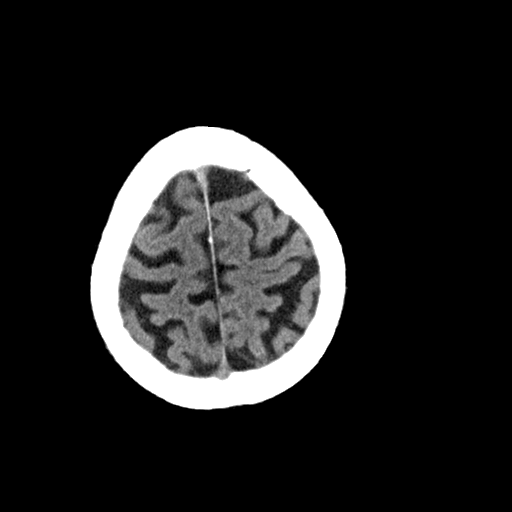
[im 28/32  brain]
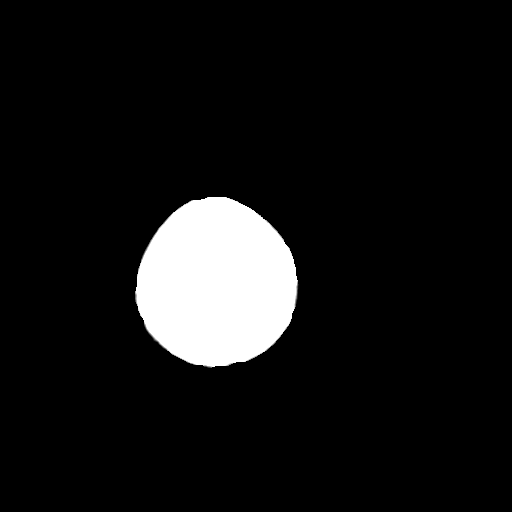

[Series 3: head bone · axial · 0.48mm/px · z∈[+1006,+1038]mm · 3 of 79 slices shown]
[im 8/79  bone]
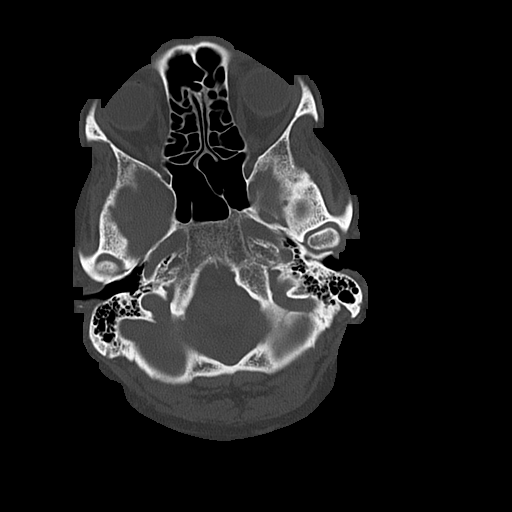
[im 16/79  bone]
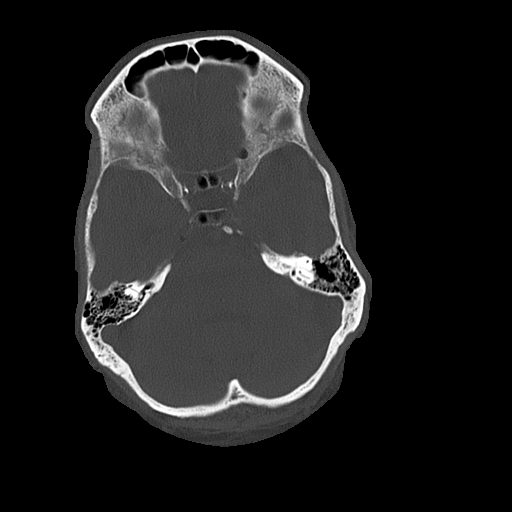
[im 24/79  bone]
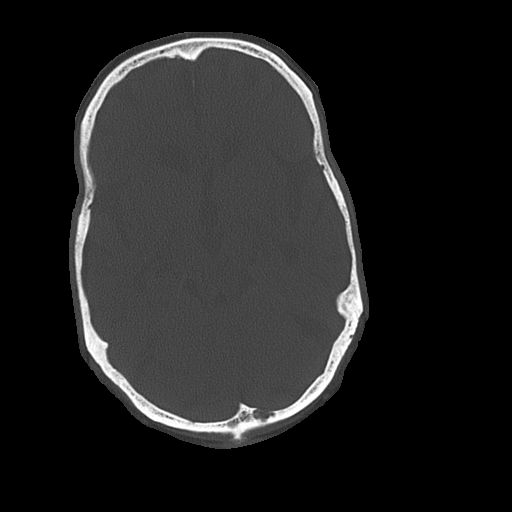

[Series 4: coronal soft · coronal · 0.33mm/px · 3 of 72 slices shown]
[im 24/72  brain]
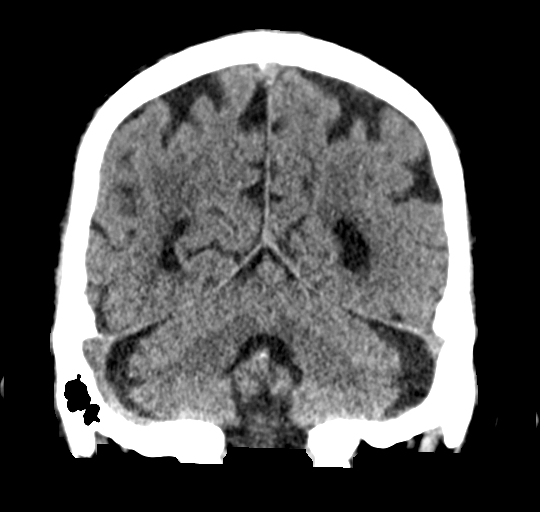
[im 32/72  brain]
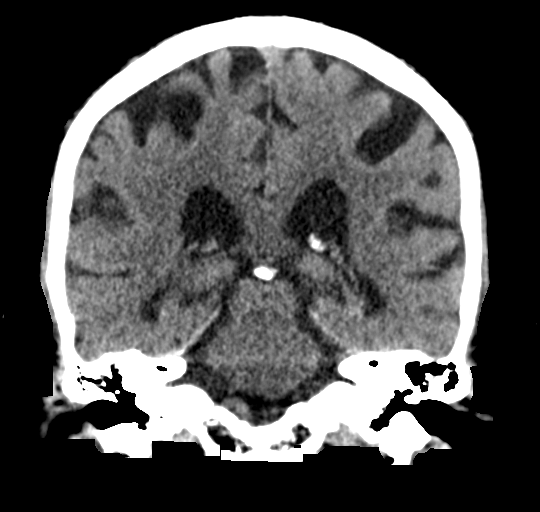
[im 40/72  brain]
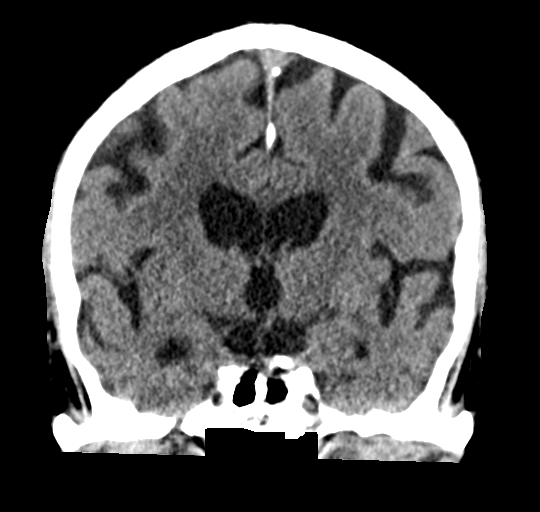

[Series 5: sagittal soft · sagittal · 0.33mm/px · 3 of 59 slices shown]
[im 20/59  brain]
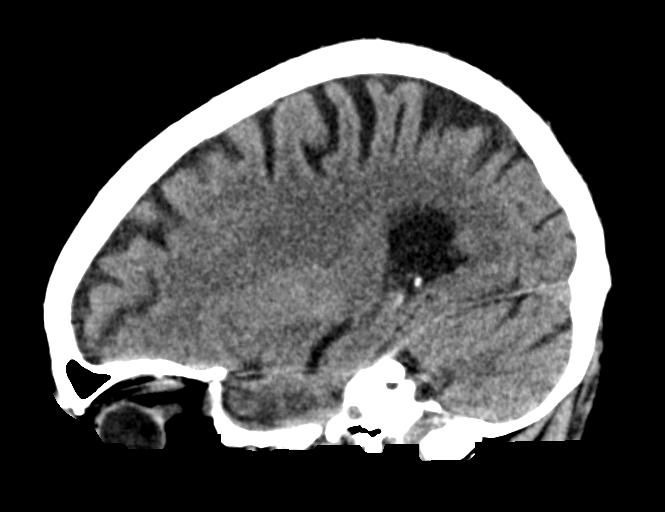
[im 30/59  brain]
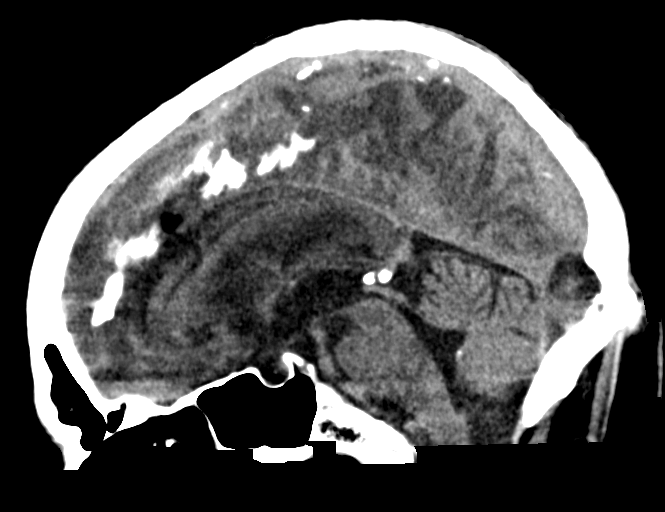
[im 39/59  brain]
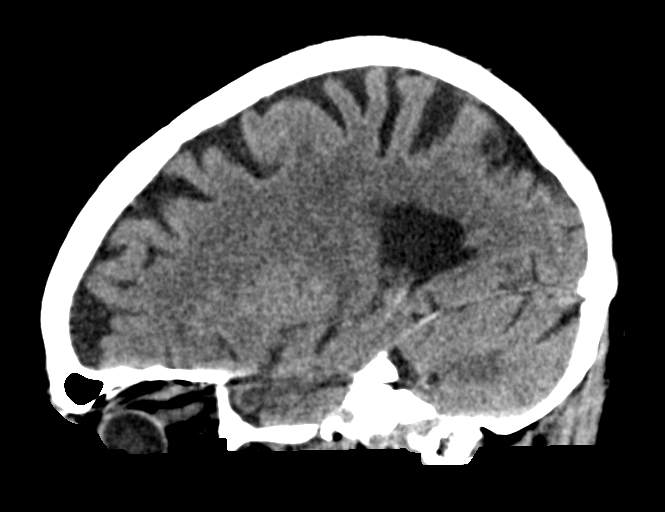

[16 of 47 positions shown; findings below may reference images not displayed]

FINDINGS: Brain: No evidence of intracranial hemorrhage, acute infarction,
hydrocephalus, extra-axial collection, or mass lesion/mass effect.
Mild diffuse cerebral atrophy and chronic small vessel disease
noted.

Vascular:  No hyperdense vessel or other acute findings.

Skull: No evidence of fracture or other significant bone
abnormality.

Sinuses/Orbits:  No acute findings.

Other: None.
IMPRESSION: No acute intracranial abnormality.

Mild cerebral atrophy and chronic small vessel disease.

## 2021-12-04 IMAGING — DX DG SHOULDER 2+V*L*
2 series · 2 of 2 positions shown · non-contrast
Comparison: None.

CLINICAL DATA: Fall, left shoulder pain

EXAM:
LEFT SHOULDER - 2+ VIEW

[shoulder axial]
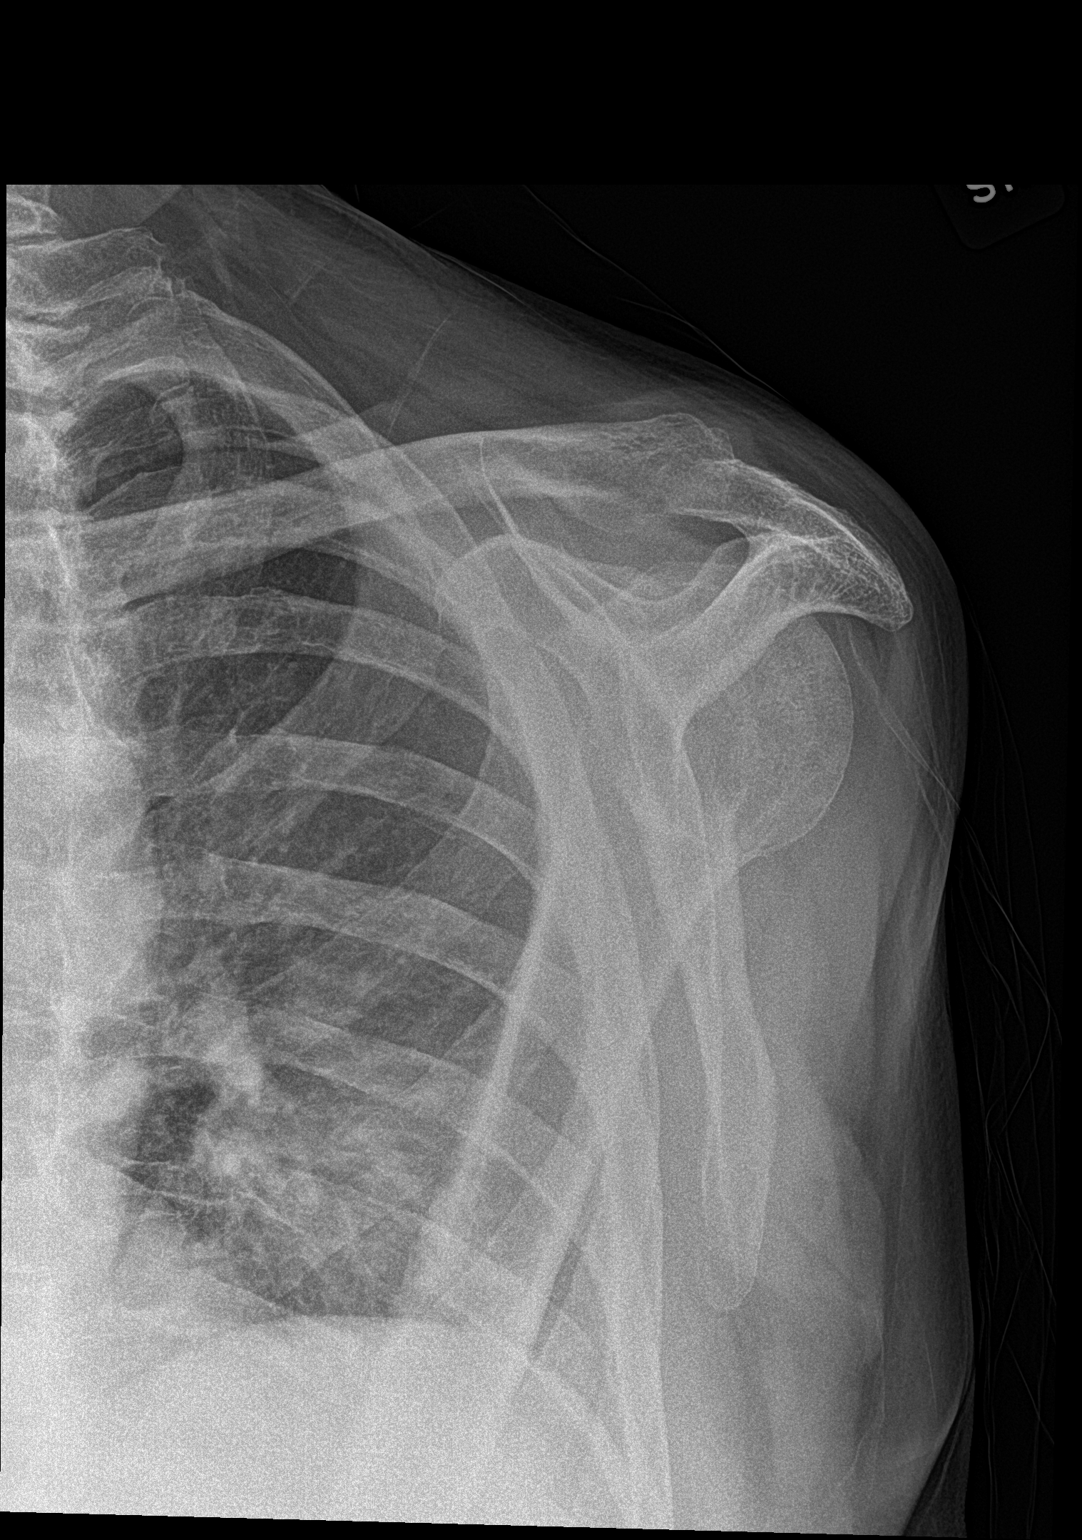

[shoulder ap]
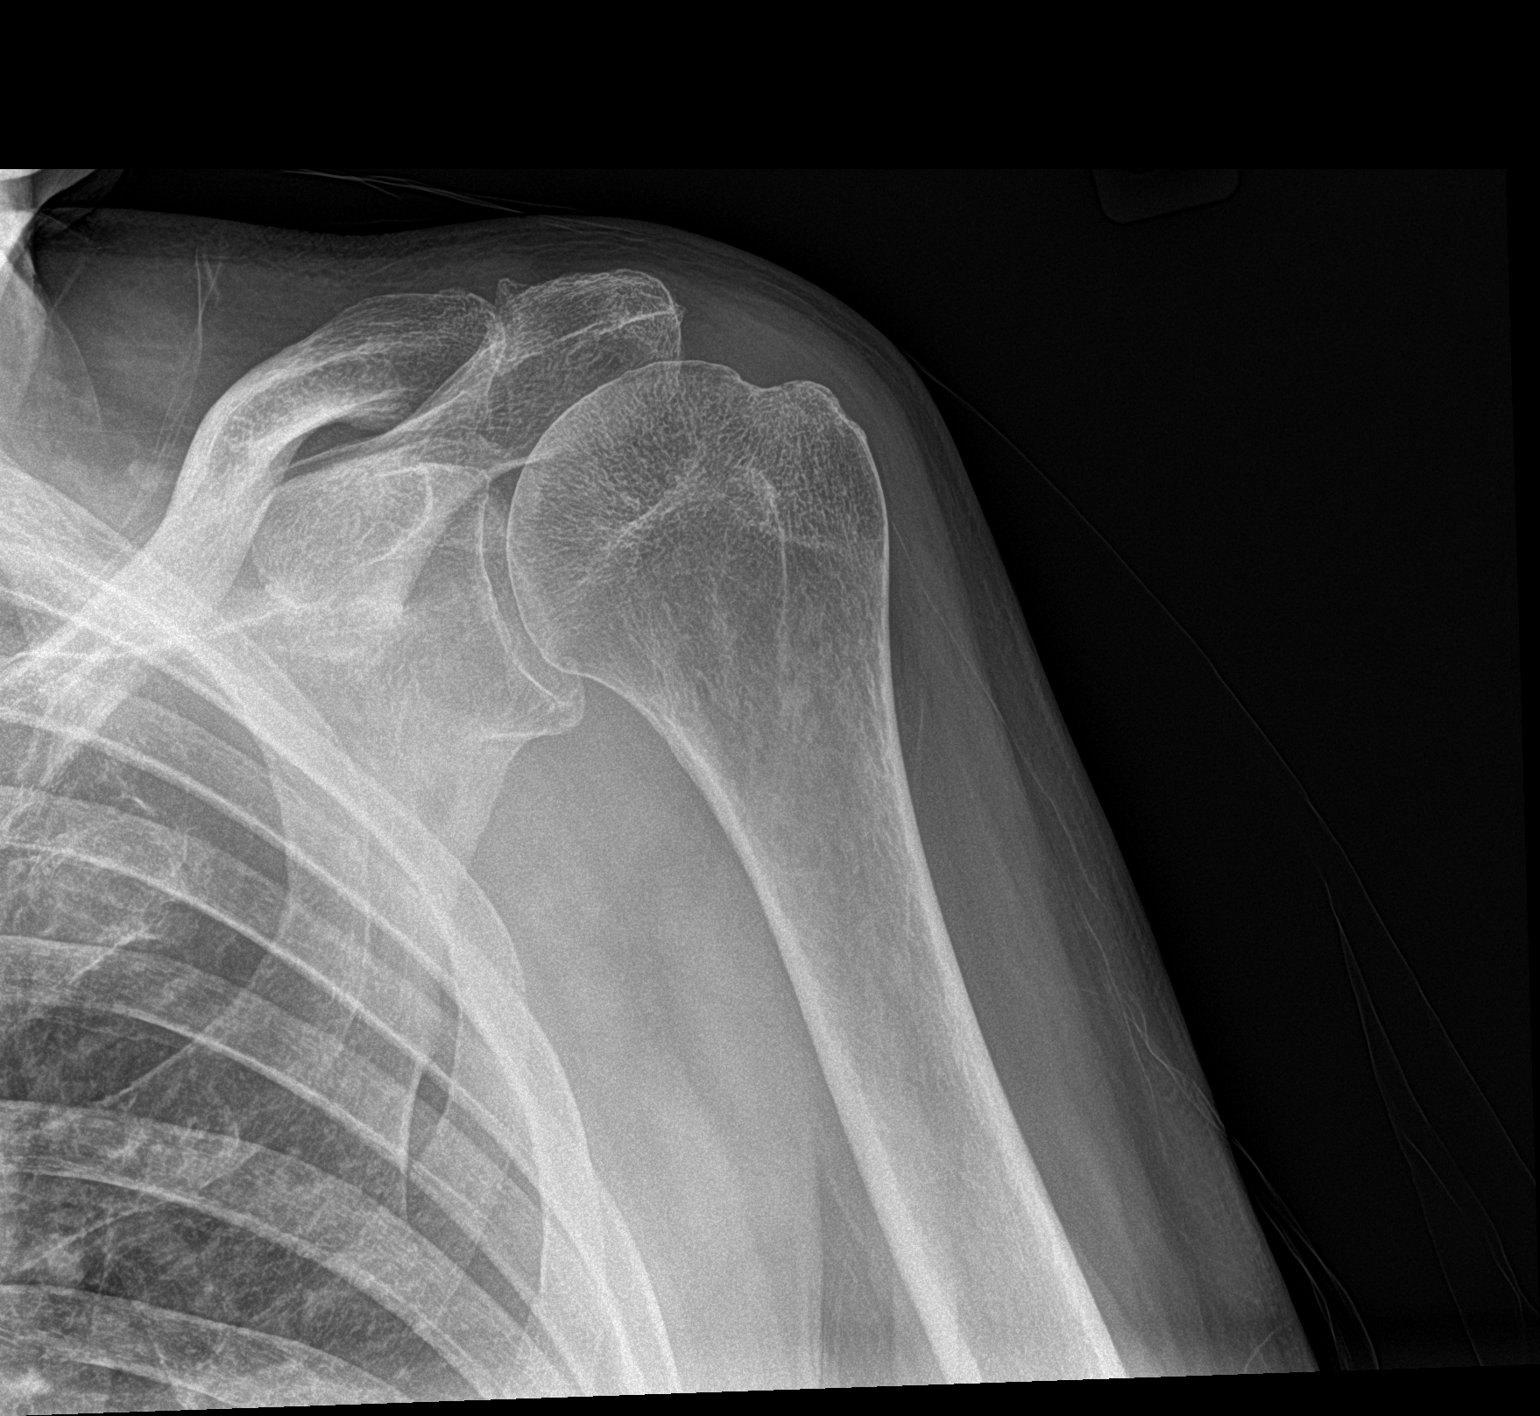

[2 of 2 positions shown; findings below may reference images not displayed]

FINDINGS: No acute fracture or dislocation identified. Mild narrowing of the
glenohumeral joint and acromioclavicular joint with early inferior
osteophytes.
IMPRESSION: No acute osseous abnormality identified.

## 2021-12-04 IMAGING — DX DG SHOULDER 2+V*R*
2 series · 2 of 2 positions shown · non-contrast
Comparison: None.

CLINICAL DATA: Fall, shoulder pain

EXAM:
RIGHT SHOULDER - 2+ VIEW

[shoulder ap]
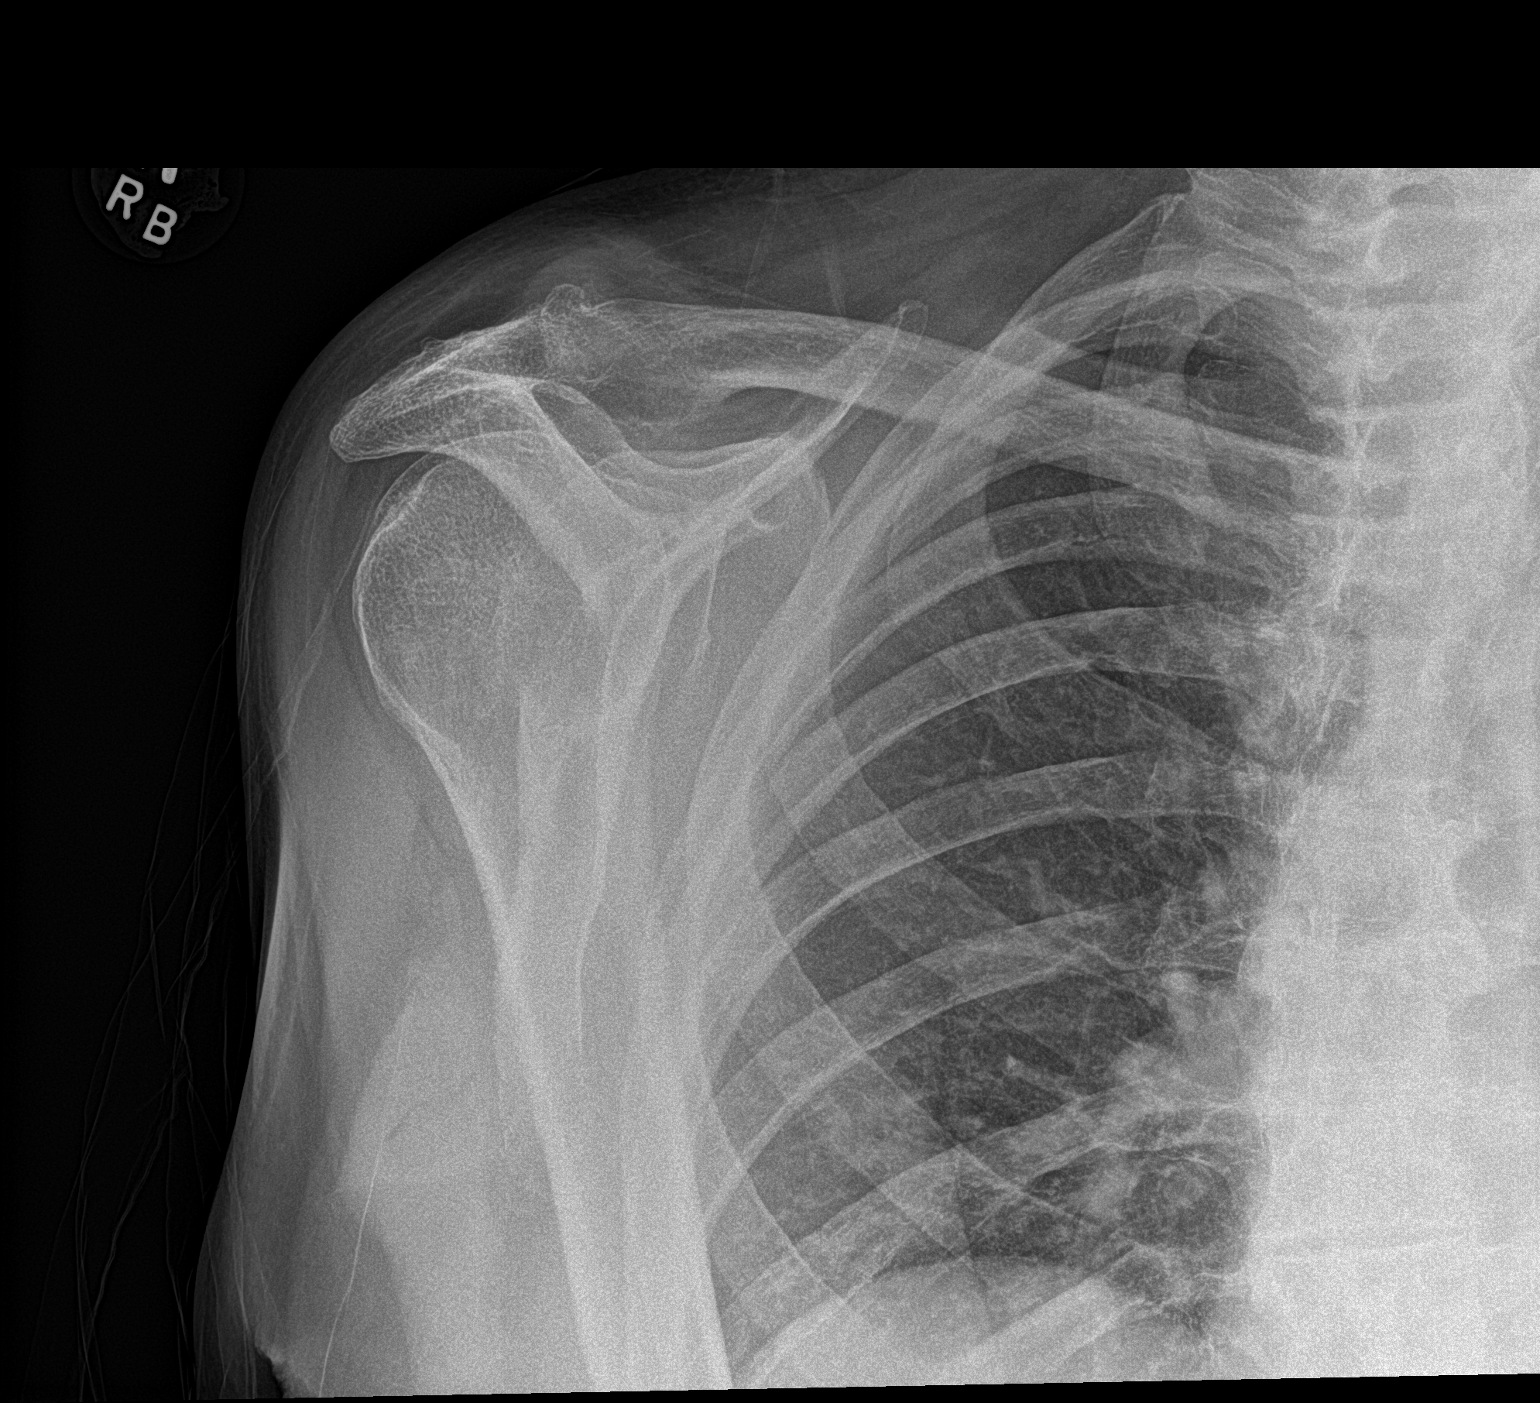

[shoulder obl]
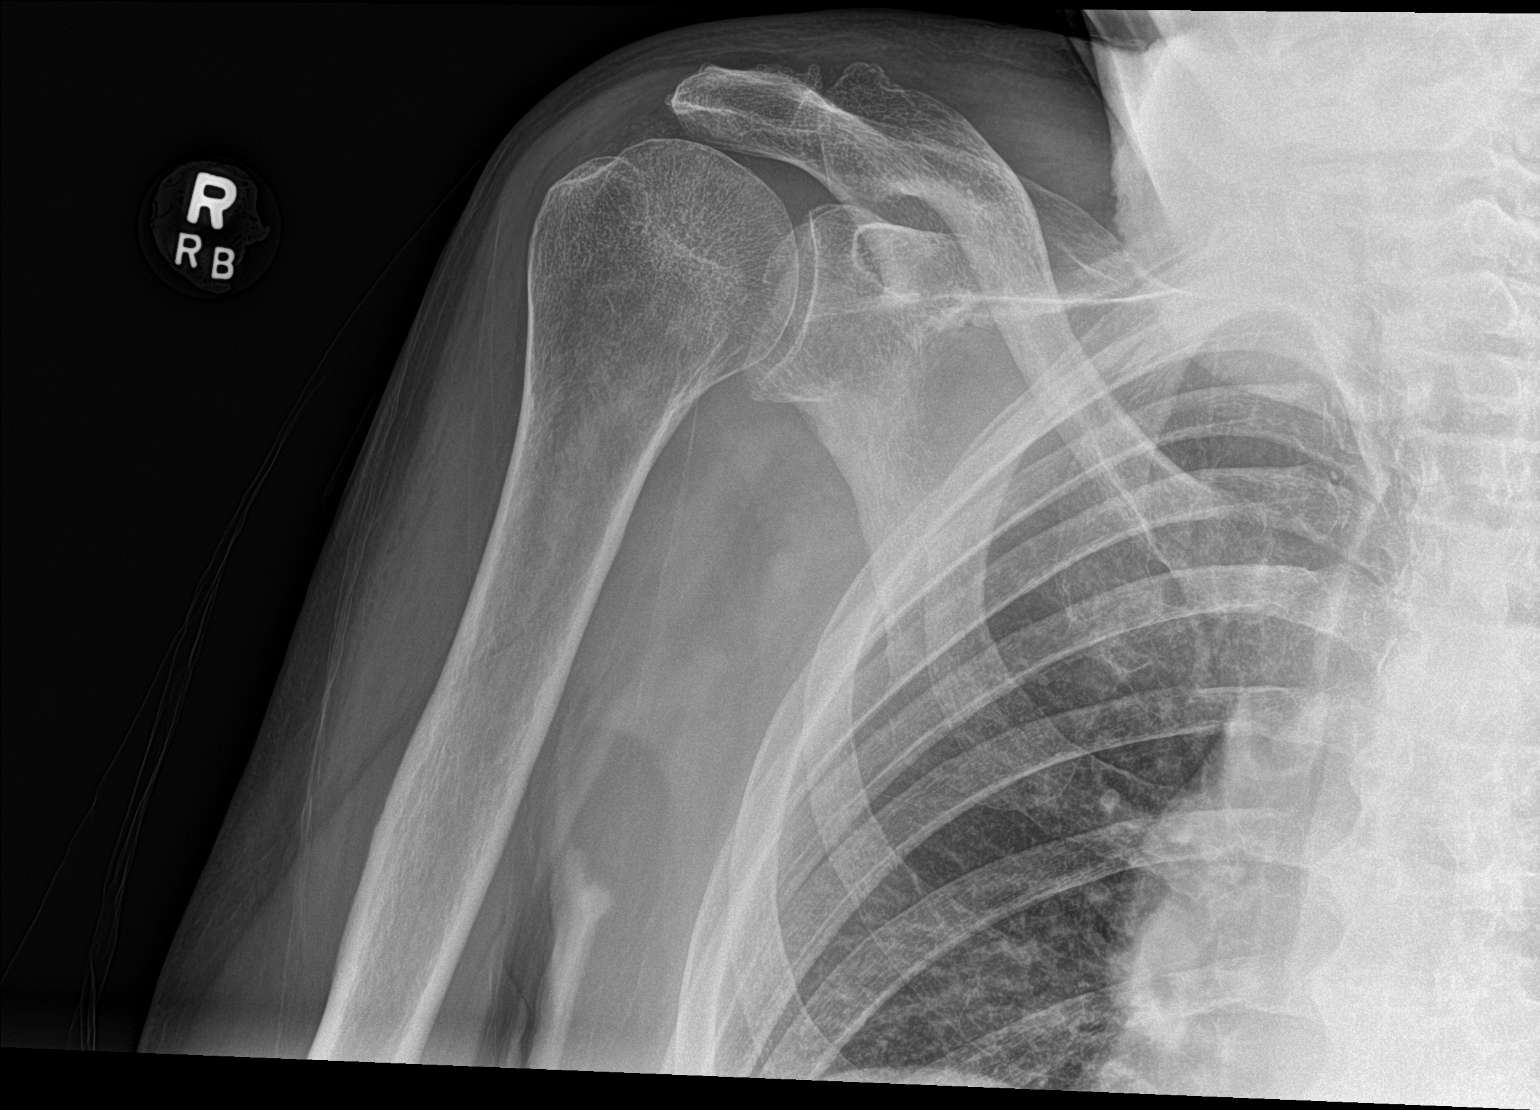

[2 of 2 positions shown; findings below may reference images not displayed]

FINDINGS: No acute fracture or dislocation identified. Mild narrowing of the
glenohumeral joint and acromioclavicular joint.
IMPRESSION: No acute osseous abnormality identified.

## 2021-12-04 IMAGING — DX DG ELBOW COMPLETE 3+V*L*
4 series · 4 of 4 positions shown · non-contrast
Comparison: None.

CLINICAL DATA: Fall, elbow pain

EXAM:
LEFT ELBOW - COMPLETE 3+ VIEW

[elbow ap]
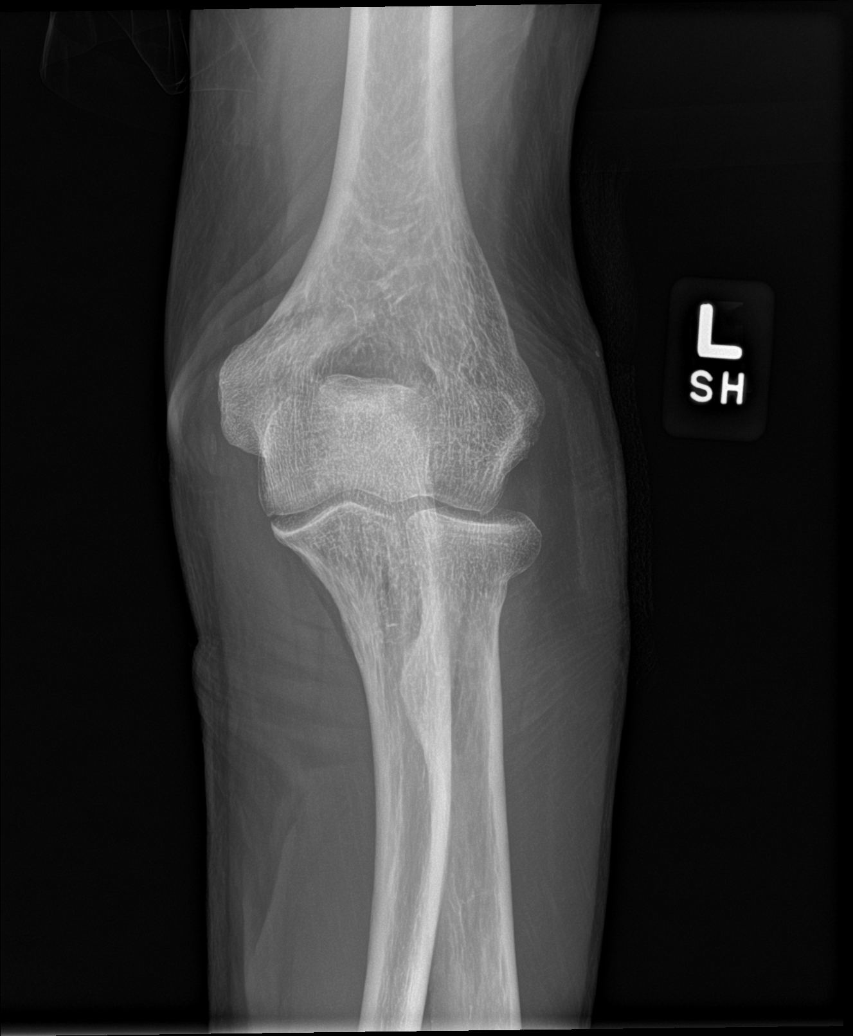

[elbow obl (1 of 2)]
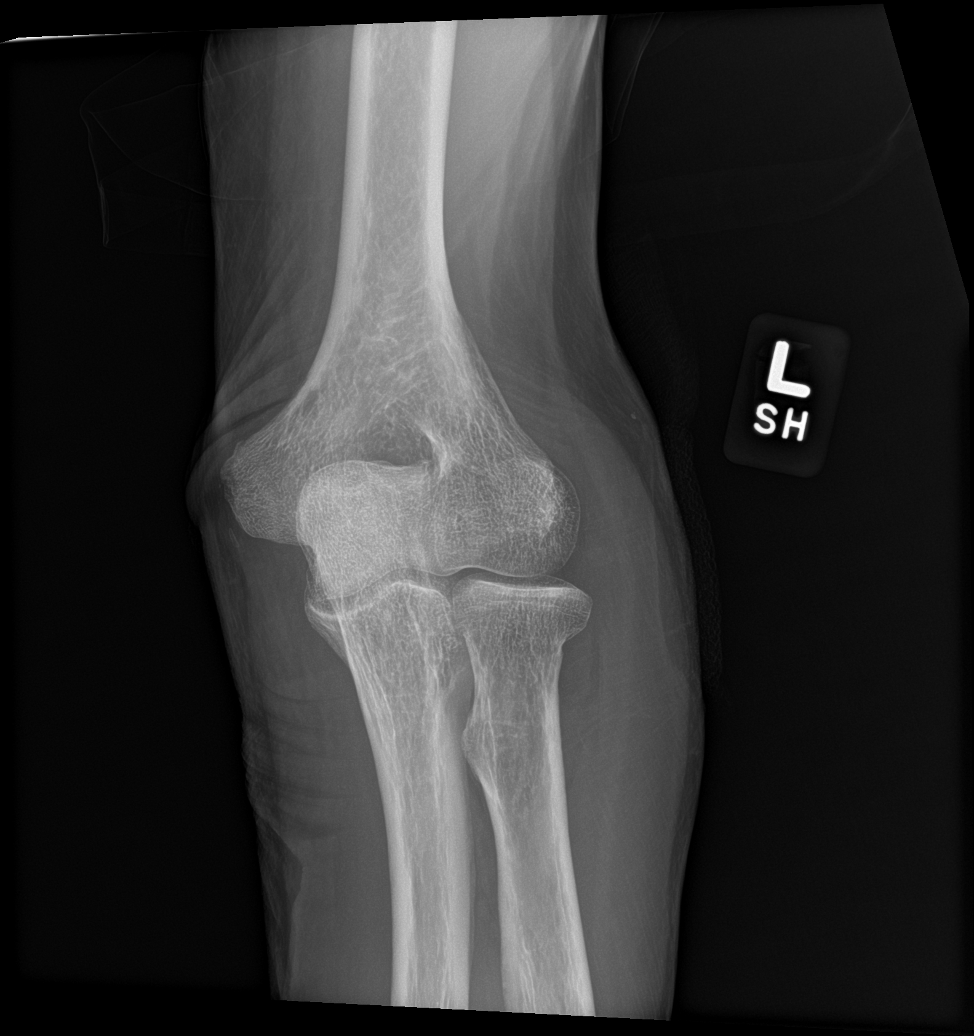

[elbow obl (2 of 2)]
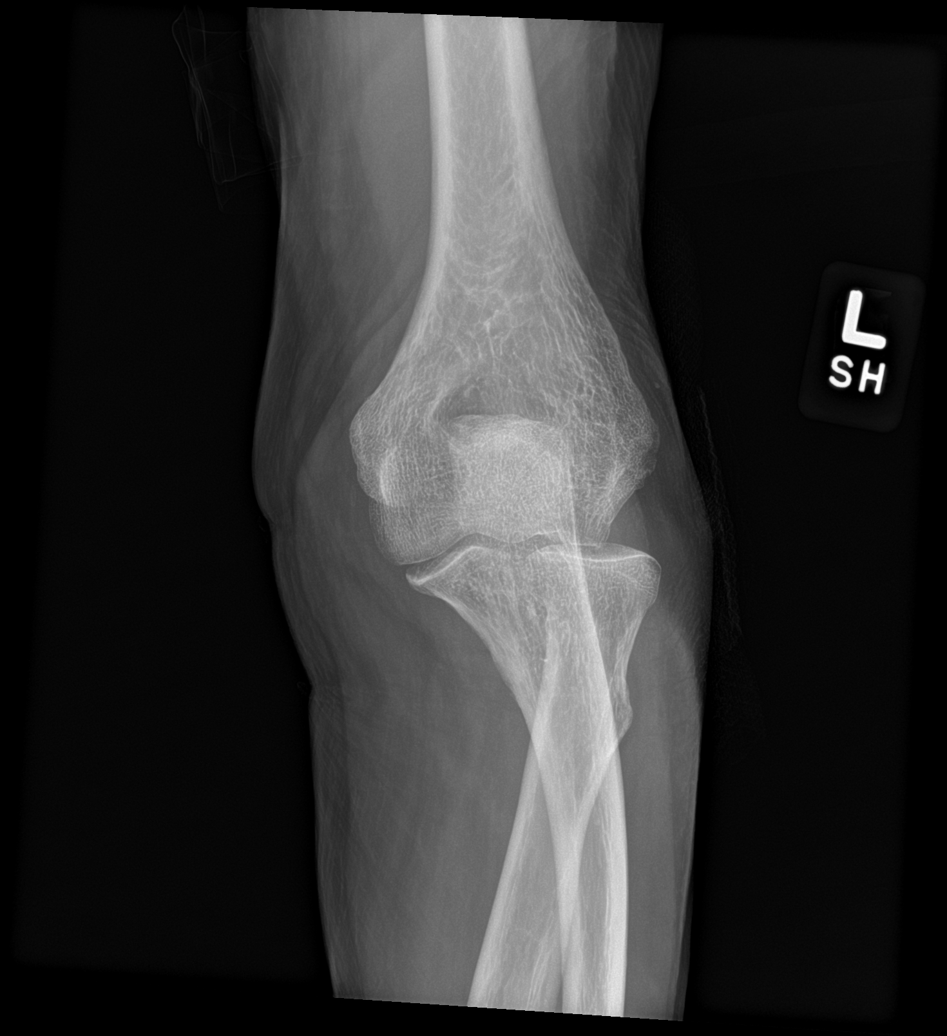

[elbow lat]
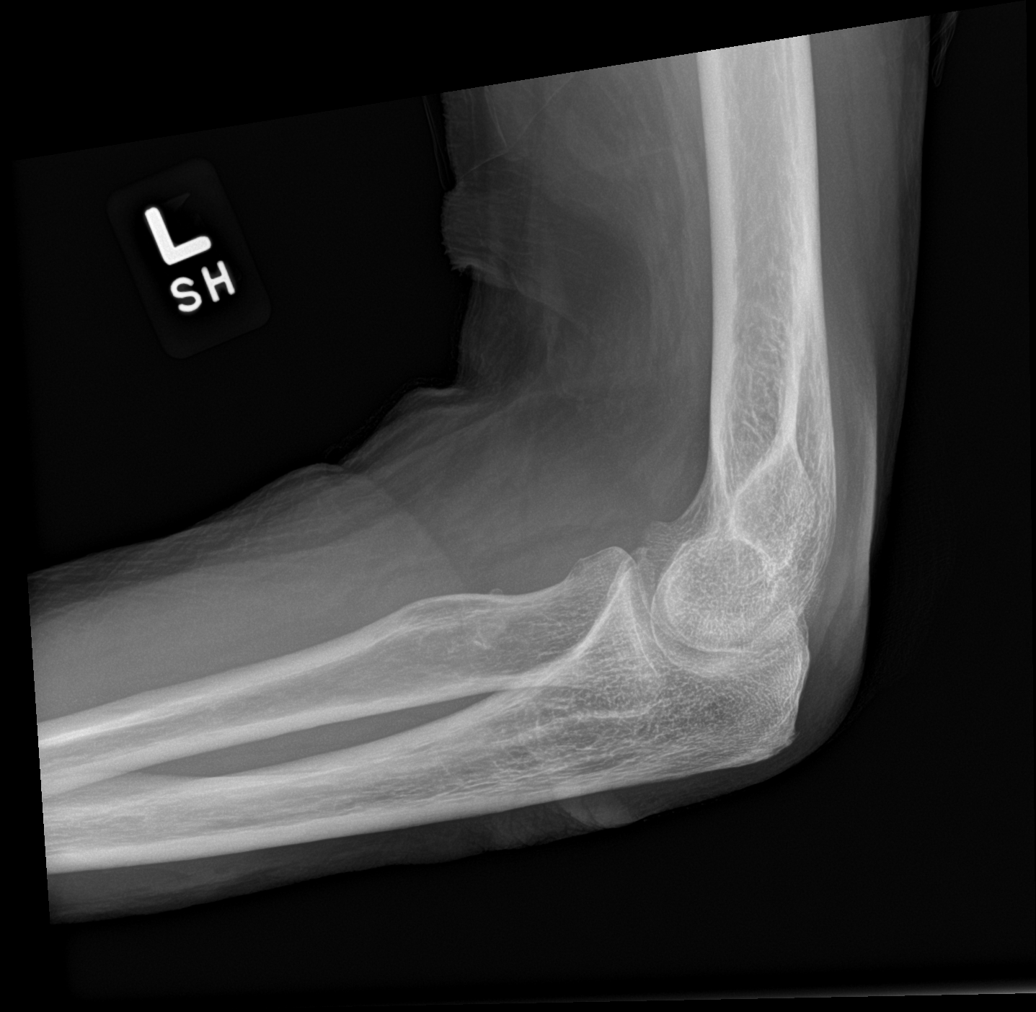

[4 of 4 positions shown; findings below may reference images not displayed]

FINDINGS: There is no evidence of fracture, dislocation, or joint effusion.
There is no evidence of significant arthropathy or other focal bone
abnormality. Soft tissues are unremarkable.
IMPRESSION: No acute fracture identified.

## 2021-12-04 MED ORDER — TETANUS-DIPHTH-ACELL PERTUSSIS 5-2.5-18.5 LF-MCG/0.5 IM SUSY
0.5000 mL | PREFILLED_SYRINGE | Freq: Once | INTRAMUSCULAR | Status: AC
  Administered 2021-12-04: 0.5 mL via INTRAMUSCULAR
  Filled 2021-12-04: qty 0.5

## 2021-12-04 NOTE — Progress Notes (Signed)
? ?  Follow-Up Visit ?  ?Subjective  ?Sean Lane is a 88 y.o. male who presents for the following: Hair/Scalp Problem (Itchy scalp x years. Pt using selsun blue. Pt has tried all kinds of shampoo, nothing is working. ). ? ?Itchy rash on scalp for years, has tried multiple over-the-counter products. ?Location:  ?Duration:  ?Quality:  ?Associated Signs/Symptoms: ?Modifying Factors:  ?Severity:  ?Timing: ?Context:  ? ?Objective  ?Well appearing patient in no apparent distress; mood and affect are within normal limits. ?Head - Anterior (Face), Scalp ?Although he does have mild erythema with some scale on scalp and face indicative of seborrheic dermatitis, the severity of his scalp which could indicate a component of cutaneous dysesthesia. ? ? ? ?A focused examination was performed including head and neck.. Relevant physical exam findings are noted in the Assessment and Plan. ? ? ?Assessment & Plan  ? ? ?Dermatitis ? ?Seborrheic dermatitis ?Head - Anterior (Face); Scalp ? ?We will try topical clobetasol foam daily for 1 month; follow-up by phone or MyChart at that time with status update. ? ?clobetasol (OLUX) 0.05 % topical foam - Scalp ?Apply topically daily. ? ?hydrocortisone 2.5 % ointment - Head - Anterior (Face) ?Apply topically 2 (two) times daily. ? ? ? ? ? ?I, Lavonna Monarch, MD, have reviewed all documentation for this visit.  The documentation on 12/04/21 for the exam, diagnosis, procedures, and orders are all accurate and complete. ?

## 2021-12-04 NOTE — ED Triage Notes (Signed)
Patient reports to the ER for a fall that occurred yesterday afternoon. Patient was reportedly trying to do yard work, stubbed his toe, fall and hit an oak tree. Scraped his face and left arm and his legs. Patient is on xarelto and hurt his head as well.  ?

## 2021-12-04 NOTE — Discharge Instructions (Signed)
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
## 2021-12-04 NOTE — ED Notes (Signed)
Dressed abrasion on elbow and left forearm with bacterin; non stick dressing and wrap. ?

## 2021-12-05 ENCOUNTER — Telehealth: Payer: Self-pay | Admitting: Family Medicine

## 2021-12-05 NOTE — Telephone Encounter (Signed)
Pt's spouse called to say pt was seen in the ED for a fall and is doing well now. She stated ED told her to schedule a follow up but they don't think it is necessary. Please advise ?

## 2021-12-05 NOTE — Telephone Encounter (Signed)
FYI

## 2021-12-05 NOTE — Telephone Encounter (Signed)
I am happy to see them/work them in if they change their mind ?

## 2021-12-12 ENCOUNTER — Other Ambulatory Visit: Payer: Self-pay | Admitting: Family Medicine

## 2021-12-12 ENCOUNTER — Encounter: Payer: Self-pay | Admitting: Family Medicine

## 2021-12-12 ENCOUNTER — Ambulatory Visit (INDEPENDENT_AMBULATORY_CARE_PROVIDER_SITE_OTHER): Payer: Medicare Other | Admitting: Family Medicine

## 2021-12-12 VITALS — BP 140/70 | HR 70 | Temp 98.4°F | Ht 73.0 in | Wt 185.4 lb

## 2021-12-12 DIAGNOSIS — W010XXD Fall on same level from slipping, tripping and stumbling without subsequent striking against object, subsequent encounter: Secondary | ICD-10-CM

## 2021-12-12 DIAGNOSIS — M79662 Pain in left lower leg: Secondary | ICD-10-CM | POA: Diagnosis not present
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
## 2021-12-12 NOTE — Patient Instructions (Signed)
Watch for more swelling, tightness, drainage, increased pain, let us know.   ?
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
## 2021-12-14 ENCOUNTER — Ambulatory Visit (INDEPENDENT_AMBULATORY_CARE_PROVIDER_SITE_OTHER): Payer: Medicare Other | Admitting: Family Medicine

## 2021-12-14 VITALS — BP 110/62 | HR 75

## 2021-12-14 DIAGNOSIS — M9901 Segmental and somatic dysfunction of cervical region: Secondary | ICD-10-CM | POA: Diagnosis not present

## 2021-12-14 DIAGNOSIS — I2510 Atherosclerotic heart disease of native coronary artery without angina pectoris: Secondary | ICD-10-CM | POA: Diagnosis not present

## 2021-12-14 DIAGNOSIS — M9904 Segmental and somatic dysfunction of sacral region: Secondary | ICD-10-CM

## 2021-12-14 DIAGNOSIS — M5136 Other intervertebral disc degeneration, lumbar region: Secondary | ICD-10-CM

## 2021-12-14 DIAGNOSIS — M9903 Segmental and somatic dysfunction of lumbar region: Secondary | ICD-10-CM | POA: Diagnosis not present

## 2021-12-14 DIAGNOSIS — T07XXXA Unspecified multiple injuries, initial encounter: Secondary | ICD-10-CM | POA: Diagnosis not present

## 2021-12-14 DIAGNOSIS — M9908 Segmental and somatic dysfunction of rib cage: Secondary | ICD-10-CM | POA: Diagnosis not present

## 2021-12-14 DIAGNOSIS — M9902 Segmental and somatic dysfunction of thoracic region: Secondary | ICD-10-CM | POA: Diagnosis not present

## 2021-12-14 DIAGNOSIS — M51369 Other intervertebral disc degeneration, lumbar region without mention of lumbar back pain or lower extremity pain: Secondary | ICD-10-CM

## 2021-12-14 MED ORDER — CEPHALEXIN 500 MG PO CAPS: 500.0000 mg | ORAL_CAPSULE | Freq: Two times a day (BID) | ORAL | 0 refills | Status: DC

## 2021-12-14 NOTE — Patient Instructions (Addendum)
Good to see you  ?Sent in antibiotic to have on hand  ?If needed take Keflex 1 capsule twice a day ?Follow up 6-8 weeks  ?

## 2021-12-14 NOTE — Assessment & Plan Note (Signed)
Chronic problem with exacerbation from patient's fall.  We discussed continuing with Tylenol.  Avoid any anti-inflammatories secondary to patient blood thinner.  Increase activity slowly.  Follow-up with me again in 6 to 8 weeks otherwise. ?

## 2021-12-14 NOTE — Assessment & Plan Note (Signed)
Patient did have a bruise.  Movement does seem to be multiple different areas.  Did have a fall.  Patient's x-rays do not show any significant fracture and is doing better overall.  Discussed with patient icing regimen and home exercise, which activities to do which ones to avoid, increase activity slowly. ?Patient was given a antibiotic due to his weight and see how it is to the lower extremity with it being a holiday weekend.  Otherwise will not take it.  Follow-up with me again in 6 weeks. ?

## 2022-01-03 ENCOUNTER — Encounter: Payer: Self-pay | Admitting: Podiatry

## 2022-01-03 ENCOUNTER — Ambulatory Visit (INDEPENDENT_AMBULATORY_CARE_PROVIDER_SITE_OTHER): Payer: Medicare Other | Admitting: Podiatry

## 2022-01-03 DIAGNOSIS — M79674 Pain in right toe(s): Secondary | ICD-10-CM

## 2022-01-03 DIAGNOSIS — B351 Tinea unguium: Secondary | ICD-10-CM | POA: Diagnosis not present

## 2022-01-03 DIAGNOSIS — D689 Coagulation defect, unspecified: Secondary | ICD-10-CM

## 2022-01-03 DIAGNOSIS — M79675 Pain in left toe(s): Secondary | ICD-10-CM

## 2022-01-03 DIAGNOSIS — E1151 Type 2 diabetes mellitus with diabetic peripheral angiopathy without gangrene: Secondary | ICD-10-CM

## 2022-01-03 NOTE — Progress Notes (Signed)
This patient returns to my office for at risk foot care.  This patient requires this care by a professional since this patient will be at risk due to having type 2 diabetes and coagulation defect.  Patient is taking xarelto.  This patient presents to the office with his wife.  This patient is unable to cut nails himself since the patient cannot reach his nails.These nails are painful walking and wearing shoes.  This patient presents for at risk foot care today.  General Appearance  Alert, conversant and in no acute stress.  Vascular  Dorsalis pedis   pulses are palpable  bilaterally. Posterior tibial pulses are absent  Bilaterally. Capillary return is within normal limits  bilaterally. Temperature is within normal limits  bilaterally.  Neurologic  Senn-Weinstein monofilament wire test diminished   bilaterally. Muscle power within normal limits bilaterally.  Nails Thick disfigured discolored nails with subungual debris  from hallux to fifth toes bilaterally. Hammer toes 2-4  B/L.  Orthopedic  No limitations of motion  feet .  No crepitus or effusions noted.  No bony pathology or digital deformities noted.  Skin  normotropic skin with no porokeratosis noted bilaterally.  No signs of infections or ulcers noted.     Onychomycosis  Pain in right toes  Pain in left toes  Consent was obtained for treatment procedures.   Mechanical debridement of nails 1-5  bilaterally performed with a nail nipper.  Filed with dremel without incident.    Return office visit   10 weeks                 Told patient to return for periodic foot care and evaluation due to potential at risk complications.   Mathius Birkeland DPM  
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
## 2022-01-26 ENCOUNTER — Ambulatory Visit (INDEPENDENT_AMBULATORY_CARE_PROVIDER_SITE_OTHER): Payer: Medicare Other | Admitting: Family Medicine

## 2022-01-26 VITALS — BP 132/70 | HR 71 | Ht 73.0 in | Wt 186.0 lb

## 2022-01-26 DIAGNOSIS — M9903 Segmental and somatic dysfunction of lumbar region: Secondary | ICD-10-CM

## 2022-01-26 DIAGNOSIS — M9902 Segmental and somatic dysfunction of thoracic region: Secondary | ICD-10-CM

## 2022-01-26 DIAGNOSIS — M9901 Segmental and somatic dysfunction of cervical region: Secondary | ICD-10-CM

## 2022-01-26 DIAGNOSIS — M5136 Other intervertebral disc degeneration, lumbar region: Secondary | ICD-10-CM | POA: Diagnosis not present

## 2022-01-26 DIAGNOSIS — M9904 Segmental and somatic dysfunction of sacral region: Secondary | ICD-10-CM

## 2022-01-26 DIAGNOSIS — I2510 Atherosclerotic heart disease of native coronary artery without angina pectoris: Secondary | ICD-10-CM

## 2022-01-26 DIAGNOSIS — M9908 Segmental and somatic dysfunction of rib cage: Secondary | ICD-10-CM | POA: Diagnosis not present

## 2022-01-26 DIAGNOSIS — M51369 Other intervertebral disc degeneration, lumbar region without mention of lumbar back pain or lower extremity pain: Secondary | ICD-10-CM

## 2022-01-26 NOTE — Patient Instructions (Signed)
See me again in 6-8 weeks ?

## 2022-01-27 NOTE — Assessment & Plan Note (Signed)
Discussed HEP, respond to OMT, discussed which activities to do which ones to avoid  RTC in 6 weeks

## 2022-01-28 ENCOUNTER — Emergency Department (HOSPITAL_BASED_OUTPATIENT_CLINIC_OR_DEPARTMENT_OTHER)
Admission: EM | Admit: 2022-01-28 | Discharge: 2022-01-28 | Disposition: A | Discharge status: Home / Self Care | Payer: Medicare Other | Attending: Emergency Medicine | Admitting: Emergency Medicine

## 2022-01-28 ENCOUNTER — Encounter (HOSPITAL_BASED_OUTPATIENT_CLINIC_OR_DEPARTMENT_OTHER): Payer: Self-pay

## 2022-01-28 ENCOUNTER — Other Ambulatory Visit: Payer: Self-pay

## 2022-01-28 ENCOUNTER — Emergency Department (HOSPITAL_BASED_OUTPATIENT_CLINIC_OR_DEPARTMENT_OTHER): Payer: Medicare Other

## 2022-01-28 DIAGNOSIS — S80211A Abrasion, right knee, initial encounter: Secondary | ICD-10-CM | POA: Insufficient documentation

## 2022-01-28 DIAGNOSIS — S51011A Laceration without foreign body of right elbow, initial encounter: Secondary | ICD-10-CM | POA: Insufficient documentation

## 2022-01-28 DIAGNOSIS — S80212A Abrasion, left knee, initial encounter: Secondary | ICD-10-CM | POA: Diagnosis not present

## 2022-01-28 DIAGNOSIS — Z7982 Long term (current) use of aspirin: Secondary | ICD-10-CM | POA: Diagnosis not present

## 2022-01-28 DIAGNOSIS — W19XXXA Unspecified fall, initial encounter: Secondary | ICD-10-CM | POA: Insufficient documentation

## 2022-01-28 DIAGNOSIS — I10 Essential (primary) hypertension: Secondary | ICD-10-CM | POA: Diagnosis not present

## 2022-01-28 DIAGNOSIS — Z79899 Other long term (current) drug therapy: Secondary | ICD-10-CM | POA: Diagnosis not present

## 2022-01-28 DIAGNOSIS — E119 Type 2 diabetes mellitus without complications: Secondary | ICD-10-CM | POA: Insufficient documentation

## 2022-01-28 DIAGNOSIS — S0083XA Contusion of other part of head, initial encounter: Secondary | ICD-10-CM | POA: Insufficient documentation

## 2022-01-28 DIAGNOSIS — S0990XA Unspecified injury of head, initial encounter: Secondary | ICD-10-CM

## 2022-01-28 IMAGING — CT CT HEAD W/O CM
4 series · 15 of 47 positions shown, 17 images · non-contrast
Comparison: None Available.

CLINICAL DATA: Head trauma, GCS=15, no focal neuro findings (low
risk) (Ped 0-17y) Fall; Head Trauma to Concrete, fall from Standing
Position. High Risk due to Xarelto.



[Series 2: head wo · axial · 0.49mm/px · z∈[-27,+93]mm · 7 of 33 slices shown, 9 images]
[im 5/33  brain]
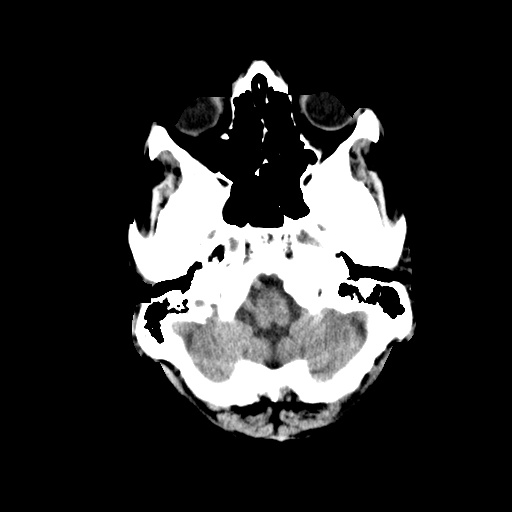
[im 5/33  bone]
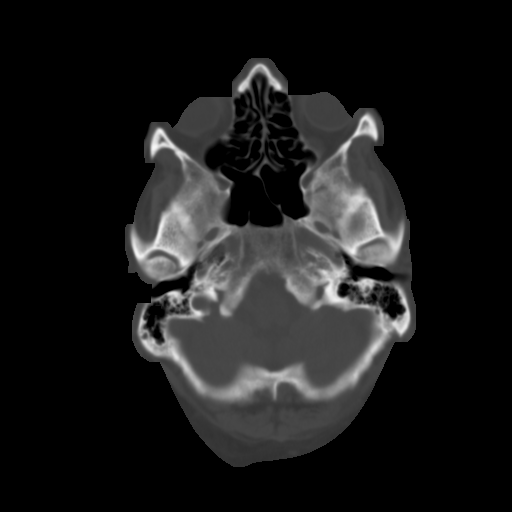
[im 9/33  brain]
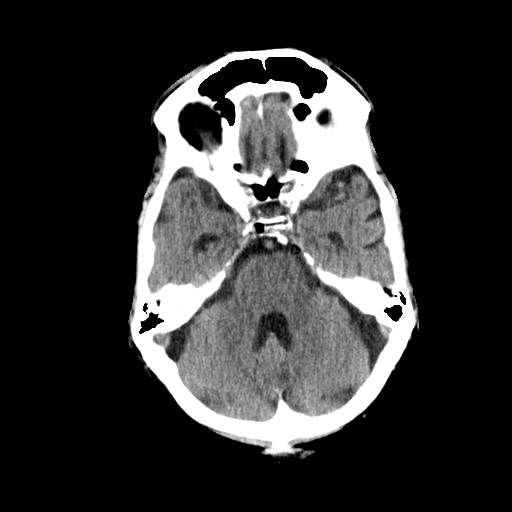
[im 13/33  brain]
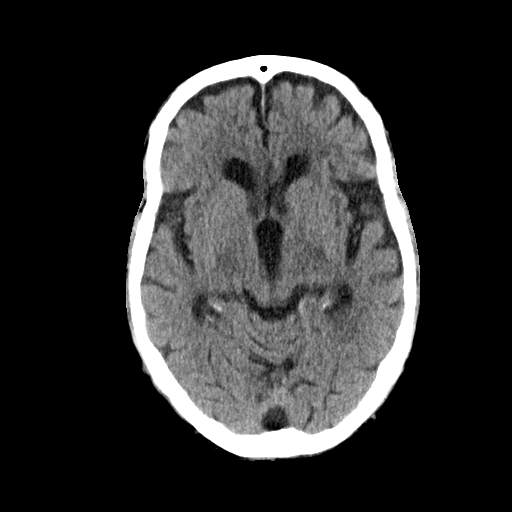
[im 17/33  brain]
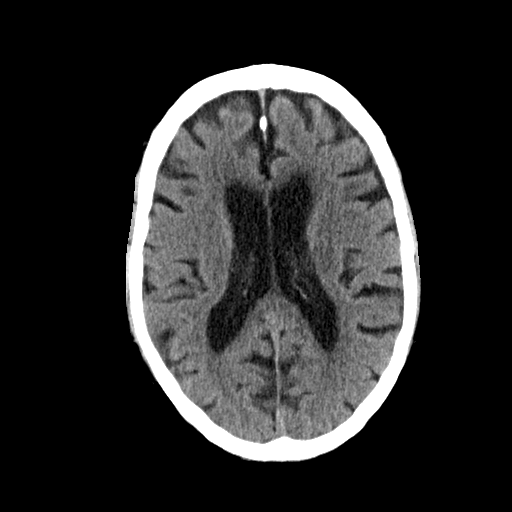
[im 21/33  brain]
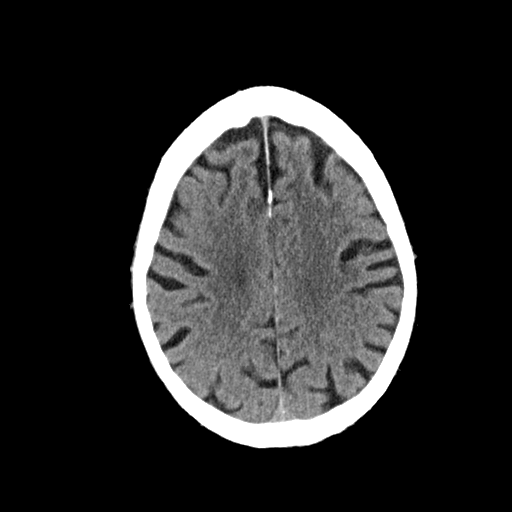
[im 21/33  bone]
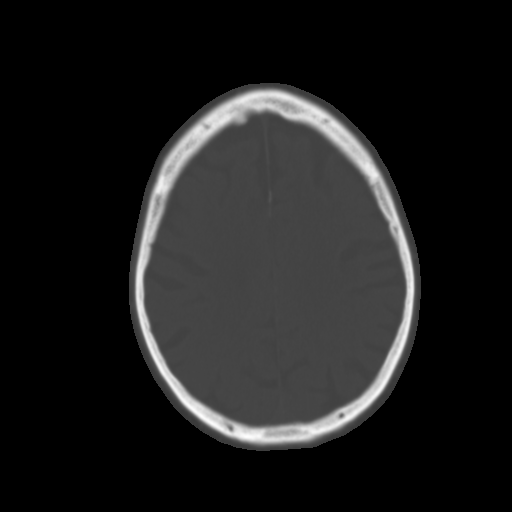
[im 25/33  brain]
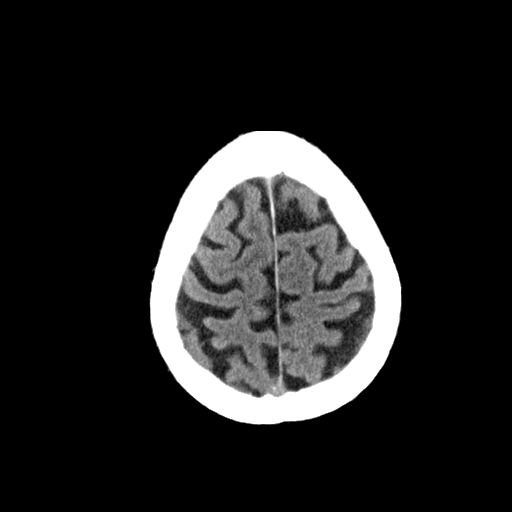
[im 29/33  brain]
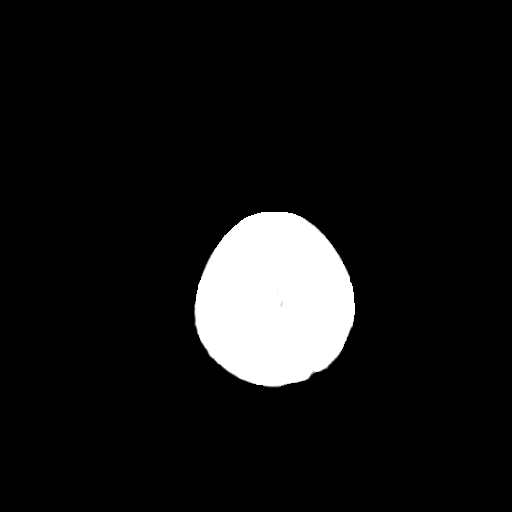

[Series 3: head bone · axial · 0.49mm/px · z∈[-31,-15]mm · 2 of 81 slices shown]
[im 9/81  bone]
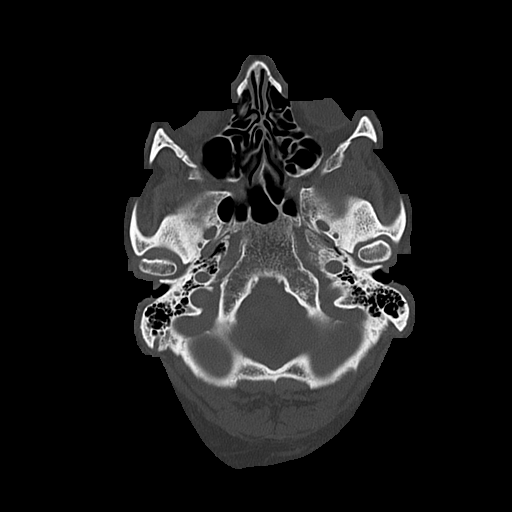
[im 17/81  bone]
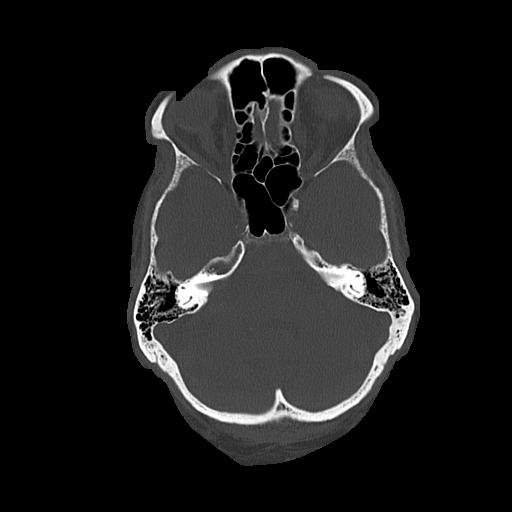

[Series 4: coronal soft · coronal · 0.34mm/px · 3 of 76 slices shown]
[im 26/76  brain]
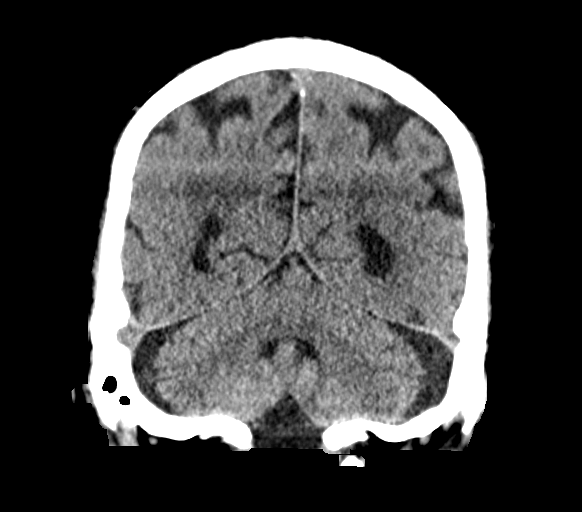
[im 34/76  brain]
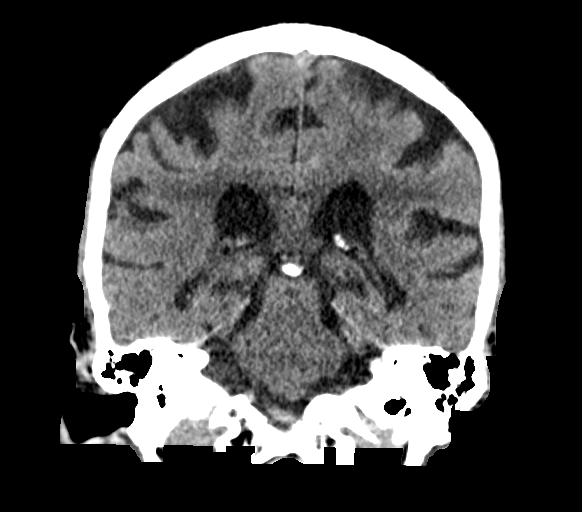
[im 42/76  brain]
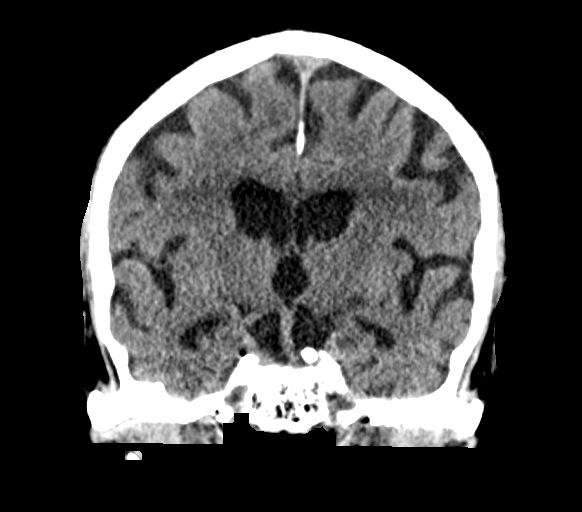

[Series 5: sagittal soft · sagittal · 0.31mm/px · 3 of 60 slices shown]
[im 20/60  brain]
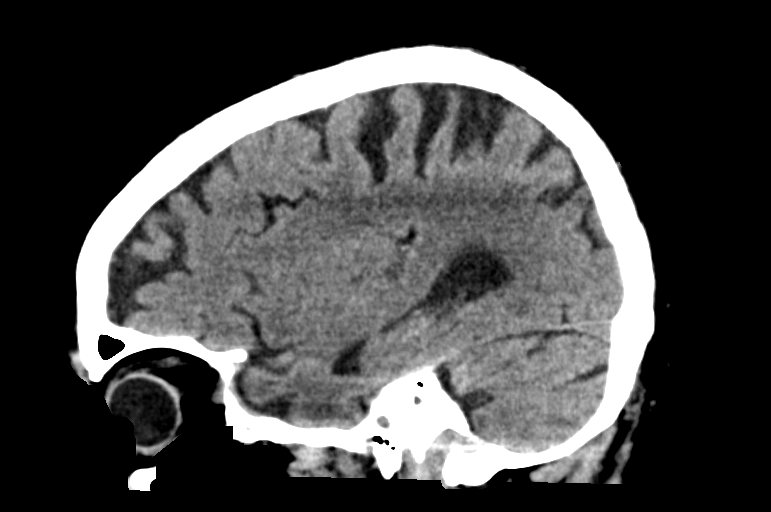
[im 30/60  brain]
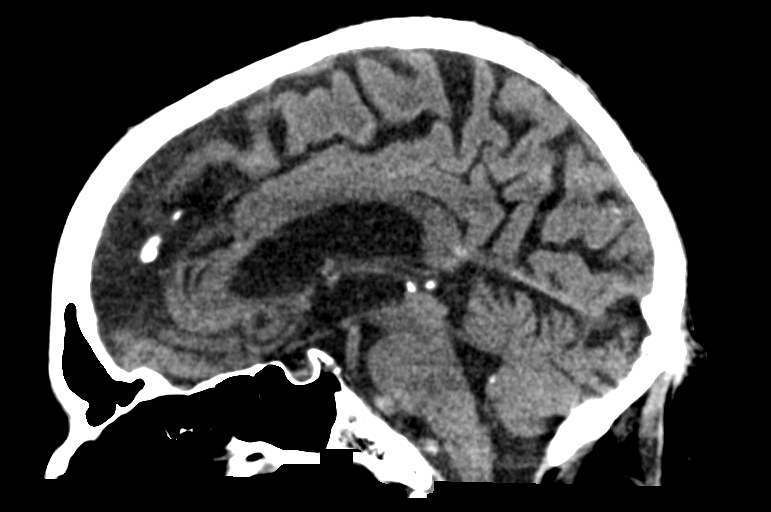
[im 40/60  brain]
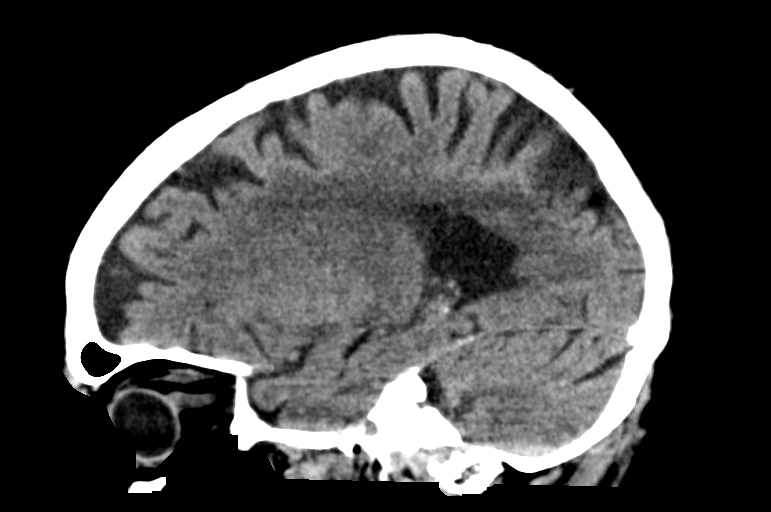

[15 of 47 positions shown; findings below may reference images not displayed]

FINDINGS: Brain: Normal anatomic configuration. Parenchymal volume loss is
commensurate with the patient's age. Mild periventricular white
matter changes are present likely reflecting the sequela of small
vessel ischemia. No abnormal intra or extra-axial mass lesion or
fluid collection. No abnormal mass effect or midline shift. No
evidence of acute intracranial hemorrhage or infarct. Ventricular
size is normal. Cerebellum unremarkable.

Vascular: No asymmetric hyperdense vasculature at the skull base.

Skull: Intact

Sinuses/Orbits: Paranasal sinuses are clear. Ocular lenses have been
removed. Orbits are otherwise unremarkable.

Other: Mastoid air cells and middle ear cavities are clear.
IMPRESSION: No acute intracranial injury.  No calvarial fracture.

## 2022-01-28 NOTE — ED Provider Notes (Addendum)
[facility] DEPT Provider Note   CSN: [id number] Arrival date & time: 01/28/22  1709     History  Chief Complaint  Patient presents with   Sean Lane is a 88 y.o. male.  Patient had a fall sort of landing on concrete that occurred earlier today.  Patient states tetanus is up-to-date.  Fall occurred about 1530.  Patient is on Xarelto.  Did have a history of atrial flutter.  Is not sure if that is the reason and exactly why he is on that.  Patient kind of caught his toe and fell forward.  Resulting in an abrasion to both knees.  The skin tear to the right elbow.  And hit the right side of his forehead.  No loss of consciousness.  Past medical history sniffer hyperlipidemia hypertension some difficulty with dizziness atrial flutter diabetes. Toe and a skin tear to the right elbow. caught and he fell forward.  He struck his right forehead.  Has abrasions to both knees patient denies any significant headache or nausea or vomiting.     Home Medications Prior to Admission medications   Medication Sig Start Date End Date Taking? Authorizing Provider  Accu-Chek Softclix Lancets lancets Check blood sugar once daily. E11.9 10/13/21   [name], MD  acetaminophen (TYLENOL) 650 MG CR tablet Take 1,300 mg by mouth every 8 (eight) hours as needed for pain.    [provider]  aspirin EC 81 MG tablet Take 81 mg by mouth daily.    [provider]  Blood Glucose Monitoring Suppl (ACCU-CHEK GUIDE ME) w/Device KIT Use to test blood sugars once daily. Dx: E11.9. Last meter supplied over 5 years ago- please give patient cash pay price if this is not covered or send [place] the script you need and we will sign. 10/13/21   [name], MD  cephALEXin (KEFLEX) 500 MG capsule Take 1 capsule (500 mg total) by mouth 2 (two) times daily. 12/14/21   [name], DO  cholecalciferol (VITAMIN D3) 25 MCG (1000 UNIT) tablet Take 2,000 Units by mouth  daily.    [provider]  clobetasol (OLUX) 0.05 % topical foam Apply topically daily. 11/23/21   Lavonna Monarch, MD  CREAM BASE EX Apply 1 application topically at bedtime. Diabetics' Dry Skin Relief (applied to feet)    [provider]  dutasteride (AVODART) 0.5 MG capsule TAKE 1 CAPSULE DAILY 05/26/21   [name], MD  escitalopram (LEXAPRO) 10 MG tablet TAKE 1 TABLET DAILY 12/13/21   [name], DO  glucose blood (ACCU-CHEK GUIDE) test strip Check once daily. E11.9 10/13/21   [name], MD  hydrocortisone 2.5 % ointment Apply topically 2 (two) times daily. 11/23/21   Lavonna Monarch, MD  Naphazoline-Pheniramine (OPCON-A OP) Apply 1 drop to eye daily as needed (allergies).    [provider]  nitroGLYCERIN (NITROSTAT) 0.4 MG SL tablet Place 1 tablet (0.4 mg total) under the tongue every 5 (five) minutes as needed for chest pain (for chest pain). Use up to 3 dosages, if no relief call 911. 02/13/19   [name], MD  Omega-3 Fatty Acids (FISH OIL) 1200 MG CAPS Take 2 capsules by mouth 2 (two) times daily.    [provider]  rosuvastatin (CRESTOR) 10 MG tablet TAKE 1 TABLET DAILY 02/28/21   [name], MD  sildenafil (REVATIO) 20 MG tablet Take 1 tablet daily PRN for erectile dysfunciton.Take 1 tablet  daily PRN for erectile dysfunciton. 06/06/21   [name], MD  tamsulosin (FLOMAX) 0.4 MG CAPS capsule TAKE 1 CAPSULE DAILY 11/22/21   [name], MD  XARELTO 20 MG TABS tablet TAKE 1 TABLET DAILY WITH   SUPPER 02/28/21   [name], MD      Allergies    Hydromorphone hcl, Ketoconazole, and Sulfonamide derivatives    Review of Systems   Review of Systems  Constitutional:  Negative for chills and fever.  HENT:  Negative for ear pain and sore throat.   Eyes:  Negative for pain and visual disturbance.  Respiratory:  Negative for cough and shortness of breath.   Cardiovascular:  Negative for chest pain and palpitations.   Gastrointestinal:  Negative for abdominal pain and vomiting.  Genitourinary:  Negative for dysuria and hematuria.  Musculoskeletal:  Negative for arthralgias and back pain.  Skin:  Positive for rash. Negative for color change.  Neurological:  Negative for seizures, syncope, facial asymmetry, speech difficulty, weakness, numbness and headaches.  All other systems reviewed and are negative.  Physical Exam Updated Vital Signs BP (!) 141/117   Pulse 69   Temp 98.7 F (37.1 C)   Resp (!) 24   Ht 1.854 m (_0 )   Wt 84.4 kg   SpO2 100%   BMI 24.55 kg/m  Physical Exam Vitals and nursing note reviewed.  Constitutional:      General: He is not in acute distress.    Appearance: Normal appearance. He is well-developed.  HENT:     Head: Normocephalic.     Comments: Slight contusion to right forehead little bit of bruising.  No open wound. Eyes:     Extraocular Movements: Extraocular movements intact.     Conjunctiva/sclera: Conjunctivae normal.     Pupils: Pupils are equal, round, and reactive to light.  Cardiovascular:     Rate and Rhythm: Normal rate and regular rhythm.     Heart sounds: No murmur heard. Pulmonary:     Effort: Pulmonary effort is normal. No respiratory distress.     Breath sounds: Normal breath sounds.  Abdominal:     Palpations: Abdomen is soft.     Tenderness: There is no abdominal tenderness.  Musculoskeletal:        General: No swelling.     Cervical back: Neck supple. No rigidity or tenderness.     Comments: Right elbow with a 2 x 4 cm skin tear.  Not deep.  Bleeding controlled.  No significant swelling at the elbow good range of motion at the elbow good range of motion at the wrist and shoulder.  Bilateral knees with superficial abrasions no deep wounds.  Skin:    General: Skin is warm and dry.     Capillary Refill: Capillary refill takes less than 2 seconds.  Neurological:     General: No focal deficit present.     Mental Status: He is alert and  oriented to person, place, and time.     Cranial Nerves: No cranial nerve deficit.     Sensory: No sensory deficit.     Motor: No weakness.  Psychiatric:        Mood and Affect: Mood normal.    ED Results / Procedures / Treatments   Labs (all labs ordered are listed, but only abnormal results are displayed) Labs Reviewed - No data to display  EKG None  Radiology CT Head Wo Contrast  Result Date: 01/28/2022 CLINICAL DATA:  Head trauma, GCS=15,  no focal neuro findings (low risk) (Ped 0-17y) Fall; Head Trauma to Concrete, fall from Standing Position. High Risk due to Xarelto. EXAM: CT HEAD WITHOUT CONTRAST TECHNIQUE: Contiguous axial images were obtained from the base of the skull through the vertex without intravenous contrast. RADIATION DOSE REDUCTION: This exam was performed according to the departmental dose-optimization program which includes automated exposure control, adjustment of the mA and/or kV according to patient size and/or use of iterative reconstruction technique. COMPARISON:  None Available. FINDINGS: Brain: Normal anatomic configuration. Parenchymal volume loss is commensurate with the patient's age. Mild periventricular white matter changes are present likely reflecting the sequela of small vessel ischemia. No abnormal intra or extra-axial mass lesion or fluid collection. No abnormal mass effect or midline shift. No evidence of acute intracranial hemorrhage or infarct. Ventricular size is normal. Cerebellum unremarkable. Vascular: No asymmetric hyperdense vasculature at the skull base. Skull: Intact Sinuses/Orbits: Paranasal sinuses are clear. Ocular lenses have been removed. Orbits are otherwise unremarkable. Other: Mastoid air cells and middle ear cavities are clear. IMPRESSION: No acute intracranial injury.  No calvarial fracture. Electronically Signed   By: Fidela Salisbury M.D.   On: 01/28/2022 19:01    Procedures Procedures    Medications Ordered in ED Medications - No  data to display  ED Course/ Medical Decision Making/ A&P                           Medical Decision Making Amount and/or Complexity of Data Reviewed Radiology: ordered.   Skin tear to the right elbow measuring about 2 x 4 cm.  CT head without any acute findings.  Patient stable for discharge home.  Outpatient discussed with primary care doctor about consideration for stopping the blood thinner.  Patient's had 2 falls here recently.   Final Clinical Impression(s) / ED Diagnoses Final diagnoses:  Fall, initial encounter  Injury of head, initial encounter  Skin tear of elbow without complication, right, initial encounter    Rx / DC Orders ED Discharge Orders     None         Fredia Sorrow, MD 01/28/22 2225    Fredia Sorrow, MD 01/28/22 2228

## 2022-01-28 NOTE — ED Notes (Signed)
Wound care to R elbow skin tear and bilat knee abrasions completed by this nurse prior to d/c -- no active bleeding from either three sites- Sites cleansed with dermal spray cleaner then patted dry -- light coat antibiotic ointment then applied --R elbow and R knee then covered with Telfa and secured with cobane; well tolerated by pt -- distal neurovascular status remains intact to all extremities -- Pt is agreeable with d/c plan as discussed by provider- this nurse has verbally reinforced d/c instructions and provided pt with written copy- pt acknowledges verbal understanding and denies any additional questions, concerns, needs- escorted to exit via w/c by ED tech [name] and spouse-- pt remains awake and alert; no acute distress noted

## 2022-01-28 NOTE — Discharge Instructions (Signed)
Talk to your primary care doctor or specialist about considering stopping the blood thinner.  For the skin tear on the right elbow daily change the dressing and apply antibiotic ointment like bacitracin ointment.  CT head without any acute findings.  Return for any new or worse symptoms.  Nursing will dress the elbow skin tear tonight prior to discharge.

## 2022-01-28 NOTE — ED Notes (Signed)
Dr Rogene Houston sent secure chat re: patient status - recent fall hitting head on concrete with use of blood thinners

## 2022-01-28 NOTE — ED Triage Notes (Signed)
Patient here POV from Home.  Endorses being  in [place] at 1530 today when his Toe got caught on an Item and falling forward onto [employer]. Skin Tear to Right Elbow and Abrasions to Bilateral Knees. Does Endorse Head Injury to Anterior Head.  No LOC. Takes Xarelto. No Neurological Deficits noted.  NAD Noted during Triage. A&Ox4. GCS 15. Ambulatory.

## 2022-02-20 ENCOUNTER — Other Ambulatory Visit: Payer: Self-pay | Admitting: Family Medicine

## 2022-03-02 ENCOUNTER — Ambulatory Visit (INDEPENDENT_AMBULATORY_CARE_PROVIDER_SITE_OTHER): Payer: Medicare Other | Admitting: Podiatry

## 2022-03-02 DIAGNOSIS — E1159 Type 2 diabetes mellitus with other circulatory complications: Secondary | ICD-10-CM

## 2022-03-02 DIAGNOSIS — L989 Disorder of the skin and subcutaneous tissue, unspecified: Secondary | ICD-10-CM
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
## 2022-03-09 ENCOUNTER — Ambulatory Visit: Payer: Medicare Other | Admitting: Family Medicine

## 2022-03-17 ENCOUNTER — Other Ambulatory Visit: Payer: Self-pay | Admitting: Family Medicine

## 2022-03-21 ENCOUNTER — Encounter: Payer: Self-pay | Admitting: Podiatry

## 2022-03-21 ENCOUNTER — Ambulatory Visit (INDEPENDENT_AMBULATORY_CARE_PROVIDER_SITE_OTHER): Payer: Medicare Other | Admitting: Podiatry

## 2022-03-21 DIAGNOSIS — M79674 Pain in right toe(s): Secondary | ICD-10-CM

## 2022-03-21 DIAGNOSIS — B351 Tinea unguium: Secondary | ICD-10-CM | POA: Diagnosis not present

## 2022-03-21 DIAGNOSIS — M79675 Pain in left toe(s): Secondary | ICD-10-CM | POA: Diagnosis not present

## 2022-03-21 DIAGNOSIS — D689 Coagulation defect, unspecified: Secondary | ICD-10-CM

## 2022-03-21 DIAGNOSIS — E1151 Type 2 diabetes mellitus with diabetic peripheral angiopathy without gangrene: Secondary | ICD-10-CM

## 2022-03-21 NOTE — Progress Notes (Signed)
This patient returns to my office for at risk foot care.  This patient requires this care by a professional since this patient will be at risk due to having type 2 diabetes and coagulation defect.  Patient is taking xarelto.  This patient presents to the office with his wife.  This patient is unable to cut nails himself since the patient cannot reach his nails.These nails are painful walking and wearing shoes.  This patient presents for at risk foot care today.  General Appearance  Alert, conversant and in no acute stress.  Vascular  Dorsalis pedis   pulses are palpable  bilaterally. Posterior tibial pulses are absent  Bilaterally. Capillary return is within normal limits  bilaterally. Temperature is within normal limits  bilaterally.  Neurologic  Senn-Weinstein monofilament wire test diminished   bilaterally. Muscle power within normal limits bilaterally.  Nails Thick disfigured discolored nails with subungual debris  from hallux to fifth toes bilaterally. Hammer toes 2-4  B/L.  Orthopedic  No limitations of motion  feet .  No crepitus or effusions noted.  No bony pathology or digital deformities noted.  Skin  normotropic skin with no porokeratosis noted bilaterally.  No signs of infections or ulcers noted.     Onychomycosis  Pain in right toes  Pain in left toes  Consent was obtained for treatment procedures.   Mechanical debridement of nails 1-5  bilaterally performed with a nail nipper.  Filed with dremel without incident.    Return office visit   10 weeks                 Told patient to return for periodic foot care and evaluation due to potential at risk complications.   [name] DPM

## 2022-03-30 ENCOUNTER — Ambulatory Visit (INDEPENDENT_AMBULATORY_CARE_PROVIDER_SITE_OTHER): Payer: Medicare Other

## 2022-03-30 ENCOUNTER — Encounter: Payer: Self-pay | Admitting: Family Medicine

## 2022-03-30 DIAGNOSIS — Z0000 Encounter for general adult medical examination without abnormal findings: Secondary | ICD-10-CM | POA: Diagnosis not present

## 2022-03-30 NOTE — Patient Instructions (Signed)
[name] , Thank you for taking time to come for your Medicare Wellness Visit. I appreciate your ongoing commitment to your health goals. Please review the following plan we discussed and let me know if I can assist you in the future.   Screening recommendations/referrals: Colonoscopy: no longer required  Recommended yearly ophthalmology/optometry visit for glaucoma screening and checkup Recommended yearly dental visit for hygiene and checkup  Vaccinations: Influenza vaccine: done 05/27/21 repeat every year  Pneumococcal vaccine: Up to date Tdap vaccine: done 12/04/21 repeat every 10 years  Shingles vaccine: Shingrix discussed. Please contact your pharmacy for coverage information.    Covid-19: completed 1/23, 2/13 &  06/15/20   Advanced directives: pt stated copies in chart   Conditions/risks identified: none at this time   Next appointment: Follow up in one year for your annual wellness visit.   Preventive Care 86 Years and Older, Male Preventive care refers to lifestyle choices and visits with your health care provider that can promote health and wellness. What does preventive care include? A yearly physical exam. This is also called an annual well check. Dental exams once or twice a year. Routine eye exams. Ask your health care provider how often you should have your eyes checked. Personal lifestyle choices, including: Daily care of your teeth and gums. Regular physical activity. Eating a healthy diet. Avoiding tobacco and drug use. Limiting alcohol use. Practicing safe sex. Taking low doses of aspirin every day. Taking vitamin and mineral supplements as recommended by your health care provider. What happens during an annual well check? The services and screenings done by your health care provider during your annual well check will depend on your age, overall health, lifestyle risk factors, and family history of disease. Counseling  Your health care provider may ask you  questions about your: Alcohol use. Tobacco use. Drug use. Emotional well-being. Home and relationship well-being. Sexual activity. Eating habits. History of falls. Memory and ability to understand (cognition). Work and work Statistician. Screening  You may have the following tests or measurements: Height, weight, and BMI. Blood pressure. Lipid and cholesterol levels. These may be checked every 5 years, or more frequently if you are over 81 years old. Skin check. Lung cancer screening. You may have this screening every year starting at age 7 if you have a 30-pack-year history of smoking and currently smoke or have quit within the past 15 years. Fecal occult blood test (FOBT) of the stool. You may have this test every year starting at age 2. Flexible sigmoidoscopy or colonoscopy. You may have a sigmoidoscopy every 5 years or a colonoscopy every 10 years starting at age 16. Prostate cancer screening. Recommendations will vary depending on your family history and other risks. Hepatitis C blood test. Hepatitis B blood test. Sexually transmitted disease (STD) testing. Diabetes screening. This is done by checking your blood sugar (glucose) after you have not eaten for a while (fasting). You may have this done every 1-3 years. Abdominal aortic aneurysm (AAA) screening. You may need this if you are a current or former smoker. Osteoporosis. You may be screened starting at age 39 if you are at high risk. Talk with your health care provider about your test results, treatment options, and if necessary, the need for more tests. Vaccines  Your health care provider may recommend certain vaccines, such as: Influenza vaccine. This is recommended every year. Tetanus, diphtheria, and acellular pertussis (Tdap, Td) vaccine. You may need a Td booster every 10 years. Zoster vaccine. You may  need this after age 9. Pneumococcal 13-valent conjugate (PCV13) vaccine. One dose is recommended after age  20. Pneumococcal polysaccharide (PPSV23) vaccine. One dose is recommended after age 40. Talk to your health care provider about which screenings and vaccines you need and how often you need them. This information is not intended to replace advice given to you by your health care provider. Make sure you discuss any questions you have with your health care provider. Document Released: 09/24/2015 Document Revised: 05/17/2016 Document Reviewed: 06/29/2015 Elsevier Interactive Patient Education  2017 [place] Prevention in the Home Falls can cause injuries. They can happen to people of all ages. There are many things you can do to make your home safe and to help prevent falls. What can I do on the outside of my home? Regularly fix the edges of walkways and driveways and fix any cracks. Remove anything that might make you trip as you walk through a door, such as a raised step or threshold. Trim any bushes or trees on the path to your home. Use bright outdoor lighting. Clear any walking paths of anything that might make someone trip, such as rocks or tools. Regularly check to see if handrails are loose or broken. Make sure that both sides of any steps have handrails. Any raised decks and porches should have guardrails on the edges. Have any leaves, snow, or ice cleared regularly. Use sand or salt on walking paths during winter. Clean up any spills in your garage right away. This includes oil or grease spills. What can I do in the bathroom? Use night lights. Install grab bars by the toilet and in the tub and shower. Do not use towel bars as grab bars. Use non-skid mats or decals in the tub or shower. If you need to sit down in the shower, use a plastic, non-slip stool. Keep the floor dry. Clean up any water that spills on the floor as soon as it happens. Remove soap buildup in the tub or shower regularly. Attach bath mats securely with double-sided non-slip rug tape. Do not have throw  rugs and other things on the floor that can make you trip. What can I do in the bedroom? Use night lights. Make sure that you have a light by your bed that is easy to reach. Do not use any sheets or blankets that are too big for your bed. They should not hang down onto the floor. Have a firm chair that has side arms. You can use this for support while you get dressed. Do not have throw rugs and other things on the floor that can make you trip. What can I do in the kitchen? Clean up any spills right away. Avoid walking on wet floors. Keep items that you use a lot in easy-to-reach places. If you need to reach something above you, use a strong step stool that has a grab bar. Keep electrical cords out of the way. Do not use floor polish or wax that makes floors slippery. If you must use wax, use non-skid floor wax. Do not have throw rugs and other things on the floor that can make you trip. What can I do with my stairs? Do not leave any items on the stairs. Make sure that there are handrails on both sides of the stairs and use them. Fix handrails that are broken or loose. Make sure that handrails are as long as the stairways. Check any carpeting to make sure that it is firmly attached  to the stairs. Fix any carpet that is loose or worn. Avoid having throw rugs at the top or bottom of the stairs. If you do have throw rugs, attach them to the floor with carpet tape. Make sure that you have a light switch at the top of the stairs and the bottom of the stairs. If you do not have them, ask someone to add them for you. What else can I do to help prevent falls? Wear shoes that: Do not have high heels. Have rubber bottoms. Are comfortable and fit you well. Are closed at the toe. Do not wear sandals. If you use a stepladder: Make sure that it is fully opened. Do not climb a closed stepladder. Make sure that both sides of the stepladder are locked into place. Ask someone to hold it for you, if  possible. Clearly mark and make sure that you can see: Any grab bars or handrails. First and last steps. Where the edge of each step is. Use tools that help you move around (mobility aids) if they are needed. These include: Canes. Walkers. Scooters. Crutches. Turn on the lights when you go into a dark area. Replace any light bulbs as soon as they burn out. Set up your furniture so you have a clear path. Avoid moving your furniture around. If any of your floors are uneven, fix them. If there are any pets around you, be aware of where they are. Review your medicines with your doctor. Some medicines can make you feel dizzy. This can increase your chance of falling. Ask your doctor what other things that you can do to help prevent falls. This information is not intended to replace advice given to you by your health care provider. Make sure you discuss any questions you have with your health care provider. Document Released: 06/24/2009 Document Revised: 02/03/2016 Document Reviewed: 10/02/2014 Elsevier Interactive Patient Education  2017 [employer].
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
## 2022-03-31 ENCOUNTER — Other Ambulatory Visit: Payer: Self-pay

## 2022-03-31 MED ORDER — NITROGLYCERIN 0.4 MG SL SUBL: 0.4000 mg | SUBLINGUAL_TABLET | SUBLINGUAL | 1 refills | Status: DC | PRN

## 2022-04-03 ENCOUNTER — Encounter: Payer: Self-pay | Admitting: Family Medicine

## 2022-04-04 ENCOUNTER — Other Ambulatory Visit: Payer: Self-pay | Admitting: Family Medicine
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
## 2022-04-10 ENCOUNTER — Telehealth: Payer: Medicare Other

## 2022-04-10 ENCOUNTER — Ambulatory Visit (INDEPENDENT_AMBULATORY_CARE_PROVIDER_SITE_OTHER): Payer: Medicare Other | Admitting: Family Medicine

## 2022-04-10 VITALS — BP 126/84 | HR 73 | Ht 73.0 in | Wt 185.0 lb

## 2022-04-10 DIAGNOSIS — M9901 Segmental and somatic dysfunction of cervical region: Secondary | ICD-10-CM | POA: Diagnosis not present

## 2022-04-10 DIAGNOSIS — M9908 Segmental and somatic dysfunction of rib cage: Secondary | ICD-10-CM | POA: Diagnosis not present

## 2022-04-10 DIAGNOSIS — M5136 Other intervertebral disc degeneration, lumbar region: Secondary | ICD-10-CM

## 2022-04-10 DIAGNOSIS — M9902 Segmental and somatic dysfunction of thoracic region: Secondary | ICD-10-CM | POA: Diagnosis not present

## 2022-04-10 DIAGNOSIS — I2510 Atherosclerotic heart disease of native coronary artery without angina pectoris: Secondary | ICD-10-CM

## 2022-04-10 DIAGNOSIS — M9903 Segmental and somatic dysfunction of lumbar region: Secondary | ICD-10-CM | POA: Diagnosis not present

## 2022-04-10 DIAGNOSIS — M9904 Segmental and somatic dysfunction of sacral region: Secondary | ICD-10-CM

## 2022-04-10 NOTE — Patient Instructions (Addendum)
Stop lexapro for next 10 days and give [place] update If it was the medicine we can consider Wellbutrin See you again in 6 weeks

## 2022-04-10 NOTE — Assessment & Plan Note (Signed)
Patient was responding extremely well to the Lexapro but is now having more difficulty again.  Patient is having difficulty with nightmares as well as more somnolence during the day.  No abnormality in blood pressure or patient's heart rate at the moment.  Discussed with patient that lets discontinue the Lexapro which patient is already down to 5 mg and see how he responds.  If this resolves the symptoms that we do know is from this medication and we can consider the possibility of Wellbutrin.  Would not go over 150 mg.  Patient has already had the same side effects with Cymbalta.  We will see how patient responds otherwise and follow-up with me again in 6 weeks

## 2022-04-20 ENCOUNTER — Encounter: Payer: Self-pay | Admitting: Family Medicine

## 2022-05-10 ENCOUNTER — Encounter: Payer: Self-pay | Admitting: Family Medicine

## 2022-05-11 ENCOUNTER — Ambulatory Visit (INDEPENDENT_AMBULATORY_CARE_PROVIDER_SITE_OTHER): Payer: Medicare Other | Admitting: Family Medicine

## 2022-05-11 ENCOUNTER — Encounter: Payer: Self-pay | Admitting: Family Medicine

## 2022-05-11 VITALS — BP 120/62 | HR 72 | Temp 97.8°F | Ht 73.0 in | Wt 187.2 lb

## 2022-05-11 DIAGNOSIS — R2689 Other abnormalities of gait and mobility: Secondary | ICD-10-CM | POA: Diagnosis not present

## 2022-05-11 DIAGNOSIS — R5383 Other fatigue: Secondary | ICD-10-CM

## 2022-05-11 DIAGNOSIS — E1159 Type 2 diabetes mellitus with other circulatory complications: Secondary | ICD-10-CM | POA: Diagnosis not present

## 2022-05-11 DIAGNOSIS — I2510 Atherosclerotic heart disease of native coronary artery without angina pectoris: Secondary | ICD-10-CM | POA: Diagnosis not present

## 2022-05-11 DIAGNOSIS — E785 Hyperlipidemia, unspecified: Secondary | ICD-10-CM

## 2022-05-11 DIAGNOSIS — R413 Other amnesia: Secondary | ICD-10-CM

## 2022-05-11 DIAGNOSIS — I4892 Unspecified atrial flutter: Secondary | ICD-10-CM

## 2022-05-11 DIAGNOSIS — H9193 Unspecified hearing loss, bilateral: Secondary | ICD-10-CM | POA: Diagnosis not present

## 2022-05-11 NOTE — Patient Instructions (Addendum)
Flu shot(high dose)  we should have these available within a month or two but please let us know if you get at outside pharmacy  Schedule PT at the desk for gait and balance training  Please stop by lab before you go for urine If you have mychart- we will send your results within 3 business days of [place] receiving them.  If you do not have mychart- we will call you about results within 5 business days of [place] receiving them.  *please also note that you will see labs on mychart as soon as they post. I will later go in and write notes on them- will say "notes from Dr. Yong Channel"   We will call you within two weeks about your referral to [facility]. If you do not hear within 2 weeks, give them a call directly   Recommended follow up: Return in about 6 months (around 11/09/2022) for followup or sooner if needed.Schedule b4 you leave.

## 2022-05-11 NOTE — Progress Notes (Signed)
Phone [phone] In person visit   Subjective:   Sean Lane is a 88 y.o. year old very pleasant male patient who presents for/with See problem oriented charting Chief Complaint  Patient presents with   Follow-up   Hyperlipidemia   Diabetes   Past Medical History-  Patient Active Problem List   Diagnosis Date Noted   CAD S/P percutaneous coronary angioplasty 10/26/2016    Priority: High   Type 2 diabetes mellitus with CAD(HCC) 12/14/2012    Priority: High   Atrial flutter ([place]) 10/03/2011    Priority: High   CAD (coronary artery disease) 12/17/2007    Priority: High   SINUS BRADYCARDIA 07/20/2010    Priority: Medium    BPH associated with nocturia 06/08/2009    Priority: Medium    DIZZINESS 12/15/2008    Priority: Medium    Hyperlipidemia 04/02/2007    Priority: Medium    Essential hypertension 04/02/2007    Priority: Medium    Thrombocytopenia ([place]) 12/04/2016    Priority: Low   Diverticulitis of colon 11/22/2015    Priority: Low   Nephrolithiasis 05/07/2014    Priority: Low   Gout attack 12/14/2012    Priority: Low   Hematuria 03/28/2012    Priority: Low   RBBB 12/28/2008    Priority: Low   GERD 12/15/2008    Priority: Low   LOSS, CONDUCTIVE HEARING, COMBINED TYPE 04/16/2007    Priority: Low   Allergic rhinitis 04/02/2007    Priority: Low   OA (osteoarthritis) of knee 04/02/2007    Priority: Low   Bilateral wrist pain 05/18/2021    Priority: 1.   Arthritis of carpometacarpal [facility]) joint of both thumbs 05/03/2021    Priority: 1.   Left rotator cuff tear 12/09/2020    Priority: 1.   Degenerative disc disease, lumbar 07/21/2020    Priority: 1.   Nonallopathic lesion of sacral region 12/17/2019    Priority: 1.   Nonallopathic lesion of lumbosacral region 12/17/2019    Priority: 1.   Spasm of left piriformis muscle 06/27/2019    Priority: 1.   Degenerative disc disease, cervical 05/28/2019    Priority: 1.   Nonallopathic lesion of thoracic  region 05/28/2019    Priority: 1.    Medications- reviewed and updated Current Outpatient Medications  Medication Sig Dispense Refill   Accu-Chek Softclix Lancets lancets Check blood sugar once daily. E11.9 90 each 3   acetaminophen (TYLENOL) 650 MG CR tablet Take 1,300 mg by mouth every 8 (eight) hours as needed for pain.     aspirin EC 81 MG tablet Take 81 mg by mouth daily.     Blood Glucose Monitoring Suppl (ACCU-CHEK GUIDE ME) w/Device KIT Use to test blood sugars once daily. Dx: E11.9. Last meter supplied over 5 years ago- please give patient cash pay price if this is not covered or send [place] the script you need and we will sign. 1 kit 0   cholecalciferol (VITAMIN D3) 25 MCG (1000 UNIT) tablet Take 2,000 Units by mouth daily.     clobetasol (OLUX) 0.05 % topical foam Apply topically daily. 50 g 3   CREAM BASE EX Apply 1 application topically at bedtime. Diabetics' Dry Skin Relief (applied to feet)     dutasteride (AVODART) 0.5 MG capsule TAKE 1 CAPSULE DAILY 90 capsule 3   glucose blood (ACCU-CHEK GUIDE) test strip Check once daily. E11.9 90 strip 3   Naphazoline-Pheniramine (OPCON-A OP) Apply 1 drop to eye daily as needed (allergies).  nitroGLYCERIN (NITROSTAT) 0.4 MG SL tablet Place 1 tablet (0.4 mg total) under the tongue every 5 (five) minutes as needed for chest pain (for chest pain). Use up to 3 dosages, if no relief call 911. 75 tablet 1   Omega-3 Fatty Acids (FISH OIL) 1200 MG CAPS Take 2 capsules by mouth 2 (two) times daily.     rosuvastatin (CRESTOR) 10 MG tablet TAKE 1 TABLET DAILY 90 tablet 3   tamsulosin (FLOMAX) 0.4 MG CAPS capsule TAKE 1 CAPSULE DAILY 90 capsule 1   XARELTO 20 MG TABS tablet TAKE 1 TABLET DAILY WITH   SUPPER 90 tablet 3   hydrocortisone 2.5 % ointment Apply topically 2 (two) times daily. (Patient not taking: Reported on 03/30/2022) 30 g 6   sildenafil (REVATIO) 20 MG tablet Take 1 tablet daily PRN for erectile dysfunciton.Take 1 tablet daily PRN for  erectile dysfunciton. 30 tablet 3   No current facility-administered medications for this visit.     Objective:  BP 120/62   Pulse 72   Temp 97.8 F (36.6 C)   Ht '6\' 1"'  (1.854 m)   Wt 187 lb 3.2 oz (84.9 kg)   SpO2 96%   BMI 24.70 kg/m  Gen: NAD, resting comfortably CV: RRR no murmurs rubs or gallops Lungs: CTAB no crackles, wheeze, rhonchi Abdomen: soft/nontender/nondistended/normal bowel sounds. No rebound or guarding.  Ext: trace edema Skin: warm, dry     Assessment and Plan   # Diabetes S: Medication: diet controlled  CBGs- once a week 125 today, 105 last friday Exercise and diet- no energy to exercise- in past had used exercise bike Lab Results  Component Value Date   HGBA1C 6.3 10/13/2021   HGBA1C 6.4 04/11/2021   HGBA1C 6.4 10/11/2020  A/P: hopefully stable- update a1c today. Continue without meds for now  # Atrial flutter S: Rate controlled without medicine Anticoagulated with xarelto 20 mg- have to avoid falls to stay on this - very strongly advised to use cane at minimum A/P: rate controlled. Appropriately anticoagulated- he is going to have to avoid falls (use cane and avoid uneven ground where issues occurred) - if has falls then we will need to stop xarelto most likely even with stroke risk -with balance issues he is open to referral to PT for gait and balance training  - doesn't think could tolerate being off BPH meds- significant issues urinating off of meds  #CAD with stent history #hyperlipidemia S: Medication:rosuvastatin 10 mg , asa 81 mg - no chest pain- has seen cardiology Lab Results  Component Value Date   CHOL 118 04/11/2021   HDL 44.10 04/11/2021   LDLCALC 46 04/11/2021   LDLDIRECT 54.0 06/05/2017   TRIG 141.0 04/11/2021   CHOLHDL 3 04/11/2021   A/P: HLD-hopefully stable- update lipids today. Continue current meds for now CAD appears asymptomatic (no cp or SOB) - if cardiology would be ok with just continuing xarelto and stopping  aspirin  #Fatigue/family history of low b12- we will update b12- he knows there is a chance insurance will not cover it and need to pay $55. Not as active/more tired/sleeping more. Diffuse body aches - some memory changes as well- will check b12 for that as well- he thinks at least a year- tsh normal in this time. Does have hearing loss- which can contribute to memory loss. No issues with driving/trouble getting lost. Encouraged retest with hearing life  Lab Results  Component Value Date   TSH 0.89 10/13/2021    Recommended follow  up: Return in about 6 months (around 11/09/2022) for followup or sooner if needed.Schedule b4 you leave. Future Appointments  Date Time Provider [place]  05/22/2022  2:30 PM [name], DO [facility] None  05/30/2022  3:30 PM [name], DPM [facility]  05/04/2023  1:30 PM [facility] HEALTH COACH [facility]   Lab/Order associations:   ICD-10-CM   1. Fatigue, unspecified type  R53.83 Vitamin B12    2. Type 2 diabetes mellitus with other circulatory complication, without long-term current use of insulin (HCC)  E11.59 Microalbumin / creatinine urine ratio    CBC with Differential/Platelet    Comprehensive metabolic panel    Lipid panel    HgB A1c    3. Balance disorder  R26.89 Ambulatory referral to Physical Therapy    4. Coronary artery disease involving native coronary artery of native heart without angina pectoris  I25.10     5. Hyperlipidemia, unspecified hyperlipidemia type  E78.5     6. Atrial flutter, unspecified type ([place])  I48.92     7. Memory loss  R41.3 Vitamin B12     No orders of the defined types were placed in this encounter.  Return precautions advised.  [name], MD

## 2022-05-12 ENCOUNTER — Encounter: Payer: Self-pay | Admitting: Family Medicine

## 2022-05-12 ENCOUNTER — Other Ambulatory Visit: Payer: Self-pay

## 2022-05-12 DIAGNOSIS — Z23 Encounter for immunization: Secondary | ICD-10-CM | POA: Diagnosis not present

## 2022-05-12 DIAGNOSIS — H9193 Unspecified hearing loss, bilateral: Secondary | ICD-10-CM

## 2022-05-12 DIAGNOSIS — R339 Retention of urine, unspecified: Secondary | ICD-10-CM | POA: Insufficient documentation

## 2022-05-12 LAB — CBC WITH DIFFERENTIAL/PLATELET
Basophils Absolute: 0 10*3/uL (ref 0.0–0.1)
Basophils Relative: 1 % (ref 0.0–3.0)
Eosinophils Absolute: 0.1 10*3/uL (ref 0.0–0.7)
Eosinophils Relative: 2 % (ref 0.0–5.0)
HCT: 36.2 % — ABNORMAL LOW (ref 39.0–52.0)
Hemoglobin: 11.8 g/dL — ABNORMAL LOW (ref 13.0–17.0)
Lymphocytes Relative: 22.1 % (ref 12.0–46.0)
Lymphs Abs: 1 10*3/uL (ref 0.7–4.0)
MCHC: 32.5 g/dL (ref 30.0–36.0)
MCV: 84.9 fl (ref 78.0–100.0)
Monocytes Absolute: 0.4 10*3/uL (ref 0.1–1.0)
Monocytes Relative: 7.8 % (ref 3.0–12.0)
Neutro Abs: 3.2 10*3/uL (ref 1.4–7.7)
Neutrophils Relative %: 67.1 % (ref 43.0–77.0)
Platelets: 216 10*3/uL (ref 150.0–400.0)
RBC: 4.26 Mil/uL (ref 4.22–5.81)
RDW: 17.9 % — ABNORMAL HIGH (ref 11.5–15.5)
WBC: 4.7 10*3/uL (ref 4.0–10.5)

## 2022-05-12 LAB — COMPREHENSIVE METABOLIC PANEL
ALT: 12 U/L (ref 0–53)
AST: 19 U/L (ref 0–37)
Albumin: 4.1 g/dL (ref 3.5–5.2)
Alkaline Phosphatase: 86 U/L (ref 39–117)
BUN: 21 mg/dL (ref 6–23)
CO2: 26 mEq/L (ref 19–32)
Calcium: 10 mg/dL (ref 8.4–10.5)
Chloride: 106 mEq/L (ref 96–112)
Creatinine, Ser: 0.96 mg/dL (ref 0.40–1.50)
GFR: 68.92 mL/min (ref >60.00)
Glucose, Bld: 117 mg/dL — ABNORMAL HIGH (ref 70–99)
Potassium: 4.3 mEq/L (ref 3.5–5.1)
Sodium: 140 mEq/L (ref 135–145)
Total Bilirubin: 0.3 mg/dL (ref 0.2–1.2)
Total Protein: 6.7 g/dL (ref 6.0–8.3)

## 2022-05-12 LAB — LIPID PANEL
Cholesterol: 112 mg/dL (ref 0–200)
HDL: 36.6 mg/dL — ABNORMAL LOW (ref >39.00)
LDL Cholesterol: 45 mg/dL (ref 0–99)
NonHDL: 75.25
Total CHOL/HDL Ratio: 3
Triglycerides: 149 mg/dL (ref 0.0–149.0)
VLDL: 29.8 mg/dL (ref 0.0–40.0)

## 2022-05-12 LAB — MICROALBUMIN / CREATININE URINE RATIO
Creatinine,U: 114 mg/dL
Microalb Creat Ratio: 4.1 mg/g (ref 0.0–30.0)
Microalb, Ur: 4.7 mg/dL — ABNORMAL HIGH (ref 0.0–1.9)

## 2022-05-12 LAB — VITAMIN B12: Vitamin B-12: 452 pg/mL (ref 211–911)

## 2022-05-12 LAB — HEMOGLOBIN A1C: Hgb A1c MFr Bld: 6.6 % — ABNORMAL HIGH (ref 4.6–6.5)

## 2022-05-17 ENCOUNTER — Other Ambulatory Visit: Payer: Self-pay | Admitting: Family Medicine
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
## 2022-05-22 ENCOUNTER — Ambulatory Visit (INDEPENDENT_AMBULATORY_CARE_PROVIDER_SITE_OTHER): Payer: Medicare Other | Admitting: Family Medicine

## 2022-05-22 VITALS — BP 132/66 | HR 71 | Ht 73.0 in | Wt 189.0 lb

## 2022-05-22 DIAGNOSIS — M5136 Other intervertebral disc degeneration, lumbar region: Secondary | ICD-10-CM | POA: Diagnosis not present

## 2022-05-22 DIAGNOSIS — I2510 Atherosclerotic heart disease of native coronary artery without angina pectoris: Secondary | ICD-10-CM | POA: Diagnosis not present

## 2022-05-22 DIAGNOSIS — M9901 Segmental and somatic dysfunction of cervical region: Secondary | ICD-10-CM | POA: Diagnosis not present

## 2022-05-22 DIAGNOSIS — M9904 Segmental and somatic dysfunction of sacral region: Secondary | ICD-10-CM | POA: Diagnosis not present

## 2022-05-22 DIAGNOSIS — D696 Thrombocytopenia, unspecified: Secondary | ICD-10-CM | POA: Diagnosis not present

## 2022-05-22 DIAGNOSIS — M9908 Segmental and somatic dysfunction of rib cage: Secondary | ICD-10-CM | POA: Diagnosis not present

## 2022-05-22 DIAGNOSIS — M9903 Segmental and somatic dysfunction of lumbar region: Secondary | ICD-10-CM

## 2022-05-22 DIAGNOSIS — M9902 Segmental and somatic dysfunction of thoracic region: Secondary | ICD-10-CM

## 2022-05-22 NOTE — Assessment & Plan Note (Signed)
Degenerative disc disease in multiple areas.  Patient was doing better withMRIs and SNRIs but unfortunately was unable to tolerate the side effects within dreams.  At this point we will continue to work on home exercises, icing regimen, discussed posture and ergonomics.  We will follow-up again in 6 to 8 weeks.

## 2022-05-22 NOTE — Patient Instructions (Addendum)
Good to see you! Good luck with irrigation

## 2022-05-22 NOTE — Assessment & Plan Note (Signed)
Can consider that there is some iron labs or MGUS labs if this continues.

## 2022-05-29 ENCOUNTER — Ambulatory Visit (INDEPENDENT_AMBULATORY_CARE_PROVIDER_SITE_OTHER): Payer: Medicare Other | Admitting: Physical Therapy

## 2022-05-29 DIAGNOSIS — M6281 Muscle weakness (generalized): Secondary | ICD-10-CM

## 2022-05-29 DIAGNOSIS — R2689 Other abnormalities of gait and mobility: Secondary | ICD-10-CM | POA: Diagnosis not present
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
## 2022-05-30 ENCOUNTER — Encounter: Payer: Self-pay | Admitting: Podiatry

## 2022-05-30 ENCOUNTER — Ambulatory Visit (INDEPENDENT_AMBULATORY_CARE_PROVIDER_SITE_OTHER): Payer: Medicare Other | Admitting: Podiatry

## 2022-05-30 ENCOUNTER — Encounter: Payer: Self-pay | Admitting: Physical Therapy

## 2022-05-30 DIAGNOSIS — D689 Coagulation defect, unspecified: Secondary | ICD-10-CM | POA: Diagnosis not present

## 2022-05-30 DIAGNOSIS — M79675 Pain in left toe(s): Secondary | ICD-10-CM | POA: Diagnosis not present

## 2022-05-30 DIAGNOSIS — B351 Tinea unguium: Secondary | ICD-10-CM

## 2022-05-30 DIAGNOSIS — E1151 Type 2 diabetes mellitus with diabetic peripheral angiopathy without gangrene: Secondary | ICD-10-CM

## 2022-05-30 DIAGNOSIS — M79674 Pain in right toe(s): Secondary | ICD-10-CM

## 2022-05-30 NOTE — Progress Notes (Signed)
This patient returns to my office for at risk foot care.  This patient requires this care by a professional since this patient will be at risk due to having type 2 diabetes and coagulation defect.  Patient is taking xarelto.  This patient presents to the office with his wife.  This patient is unable to cut nails himself since the patient cannot reach his nails.These nails are painful walking and wearing shoes.  This patient presents for at risk foot care today.  General Appearance  Alert, conversant and in no acute stress.  Vascular  Dorsalis pedis   pulses are palpable  bilaterally. Posterior tibial pulses are absent  Bilaterally. Capillary return is within normal limits  bilaterally. Temperature is within normal limits  bilaterally.  Neurologic  Senn-Weinstein monofilament wire test diminished   bilaterally. Muscle power within normal limits bilaterally.  Nails Thick disfigured discolored nails with subungual debris  from hallux to fifth toes bilaterally. Hammer toes 2-4  B/L.  Orthopedic  No limitations of motion  feet .  No crepitus or effusions noted.  No bony pathology or digital deformities noted.  Skin  normotropic skin with no porokeratosis noted bilaterally.  No signs of infections or ulcers noted.     Onychomycosis  Pain in right toes  Pain in left toes  Consent was obtained for treatment procedures.   Mechanical debridement of nails 1-5  bilaterally performed with a nail nipper.  Filed with dremel without incident.    Return office visit   10 weeks                 Told patient to return for periodic foot care and evaluation due to potential at risk complications.   [name] DPM

## 2022-06-01 DIAGNOSIS — D692 Other nonthrombocytopenic purpura: Secondary | ICD-10-CM | POA: Diagnosis not present

## 2022-06-01 DIAGNOSIS — L218 Other seborrheic dermatitis: Secondary | ICD-10-CM | POA: Diagnosis not present

## 2022-06-01 DIAGNOSIS — D2262 Melanocytic nevi of left upper limb, including shoulder: Secondary | ICD-10-CM | POA: Diagnosis not present

## 2022-06-01 DIAGNOSIS — D485 Neoplasm of uncertain behavior of skin: Secondary | ICD-10-CM | POA: Diagnosis not present

## 2022-06-01 DIAGNOSIS — L821 Other seborrheic keratosis: Secondary | ICD-10-CM | POA: Diagnosis not present

## 2022-06-01 DIAGNOSIS — C44311 Basal cell carcinoma of skin of nose: Secondary | ICD-10-CM | POA: Diagnosis not present

## 2022-06-04 ENCOUNTER — Other Ambulatory Visit: Payer: Self-pay | Admitting: Family Medicine

## 2022-06-12 ENCOUNTER — Encounter: Payer: Medicare Other | Admitting: Physical Therapy

## 2022-06-14 ENCOUNTER — Encounter: Payer: Medicare Other | Admitting: Physical Therapy

## 2022-06-19 ENCOUNTER — Encounter: Payer: Medicare Other | Admitting: Physical Therapy

## 2022-06-21 ENCOUNTER — Encounter: Payer: Medicare Other | Admitting: Physical Therapy

## 2022-06-23 ENCOUNTER — Telehealth: Payer: Self-pay | Admitting: Cardiovascular Disease

## 2022-06-23 ENCOUNTER — Telehealth: Payer: Self-pay | Admitting: Family Medicine

## 2022-06-23 NOTE — Telephone Encounter (Signed)
Ok happy to see him if BP does not remain lower- hope he has a happy 89nd birthday

## 2022-06-23 NOTE — Telephone Encounter (Signed)
Called patient's wife (DPR) back about message. She stated patient has been digging in the yard all day looking for a leak. He has not been feeling well the last couple of days, feels tired and worn out. Today is the first day patient's BP has been up and patient's wife thinks it is due to all the physical labor he is doing in the yard.  Encouraged patient's wife to make sure patient is keeping a low salt diet and resting. Encourage her to call patient's PCP if he starts to have any cold like symptoms, and if his symptoms get worse to go to urgent care. Made patient an appointment on Monday to see NP for further evaluation.

## 2022-06-23 NOTE — Telephone Encounter (Signed)
Please schedule f/u for this.

## 2022-06-23 NOTE — Telephone Encounter (Signed)
Caller States: -elevated BP for 2 days -06/23/22 1:00pm 175/79   65 -06/23/22 AM 178/71   Caller is with patient. Call transferred to [facility], patient coordinator, for PCP Triage / Team Health Nurse

## 2022-06-23 NOTE — Telephone Encounter (Signed)
Not sure why this was sent to Triage.

## 2022-06-23 NOTE — Telephone Encounter (Signed)
FYI

## 2022-06-23 NOTE — Telephone Encounter (Signed)
Pt refused to go to ED as advised.   Patient Name: Sean Lane Gender: Male DOB: 04/02/1928 Age: 88 Y 11 M 28 D Return Phone Number: [phone] (Primary) Address: City/ State/ Zip: [place]  [address] Client [place] [facility] [place] Client Site [place] [facility] [place] Day Provider [name]- MD Contact Type Call Who Is Calling Patient / Member / Family / Caregiver Call Type Triage / Clinical Caller Name [name] Shanker Relationship To Patient Spouse Return Phone Number [phone] (Primary) Chief Complaint Blood Pressure High Reason for Call Symptomatic / Request for [place] states her husband has high blood pressure (178/71) pulse of 65. [employer], will contact clniic as per directives. Translation No Nurse Assessment Nurse: Patsey Berthold, RN, [place] Date/Time (Eastern Time): 06/23/2022 1:18:04 PM Confirm and document reason for call. If symptomatic, describe symptoms. ---Caller states patient has BP 178/71 and HR 65 and reports "Not feeling well". Caller reports patient expressed being under "stress". Denies any neuro symptoms at this time. Denies any chest pain, reports "loss of energy". Currently not on BP medication. Does the patient have any new or worsening symptoms? ---Yes Will a triage be completed? ---Yes Related visit to physician within the last 2 weeks? ---No Does the PT have any chronic conditions? (i.e. diabetes, asthma, this includes High risk factors for pregnancy, etc.) ---Yes List chronic conditions. ---A-flutter - on Xarelto Is this a behavioral health or substance abuse call? ---No Guidelines Guideline Title Affirmed Question Affirmed Notes Nurse Date/Time (Eastern Time) Blood Pressure - High [7] Systolic BP >= 741 OR Diastolic >= 423 AND [9] cardiac (e.g., breathing Patsey Berthold, RN, [place] 06/23/2022  1:24:07 PM Guidelines Guideline Title Affirmed Question Affirmed Notes Nurse Date/Time (Eastern Time) difficulty, chest pain) or neurologic symptoms (e.g., new-onset blurred or double vision, unsteady gait) Disp. Time Eilene Ghazi Time) Disposition Final User 06/23/2022 1:32:20 PM Go to ED Now Yes Patsey Berthold, RN, [place] Final Disposition 06/23/2022 1:32:20 PM Go to ED Now Yes Patsey Berthold, RN, [place] Disagree/Comply Disagree Caller Understands Yes PreDisposition Did not know what to do Care Advice Given Per Guideline GO TO ED NOW: * You need to be seen in the Emergency Department. NOTE TO TRIAGER - DRIVING: * Another adult should drive. CALL EMS 911 IF: * Passes out or faints * Becomes confused * Becomes too weak to stand * You become worse CARE ADVICE given per High Blood Pressure (Adult) guideline. Comments User: Lerry Liner, RN Date/Time Eilene Ghazi Time): 06/23/2022 1:24:02 PM caller reports stents x2 2019 User: Lerry Liner, RN Date/Time Eilene Ghazi Time): 06/23/2022 1:25:11 PM correction - stents on 2009 User: Lerry Liner, RN Date/Time Eilene Ghazi Time): 06/23/2022 1:35:01 PM [facility] called and information relayed as per clinic directives. Referrals GO TO FACILITY OTHER - SPECIFY

## 2022-06-23 NOTE — Telephone Encounter (Signed)
Pt c/o BP issue: STAT if pt c/o blurred vision, one-sided weakness or slurred speech  1. What are your last 5 BP readings?  178/71  hr65  175/68?   2. Are you having any other symptoms (ex. Dizziness, headache, blurred vision, passed out)? No   3. What is your BP issue? Pt wife states pt hasn't been feeling well. The last month she states he has been doing a lot of physical labor.
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
## 2022-06-26 ENCOUNTER — Encounter: Payer: Medicare Other | Admitting: Physical Therapy

## 2022-06-26 ENCOUNTER — Telehealth: Payer: Self-pay | Admitting: Cardiovascular Disease

## 2022-06-26 ENCOUNTER — Encounter: Payer: Self-pay | Admitting: Nurse Practitioner

## 2022-06-26 ENCOUNTER — Ambulatory Visit: Payer: Medicare Other | Attending: Nurse Practitioner | Admitting: Nurse Practitioner

## 2022-06-26 VITALS — BP 128/62 | HR 98 | Ht 73.0 in | Wt 189.0 lb

## 2022-06-26 DIAGNOSIS — I10 Essential (primary) hypertension: Secondary | ICD-10-CM | POA: Insufficient documentation

## 2022-06-26 DIAGNOSIS — I4892 Unspecified atrial flutter: Secondary | ICD-10-CM | POA: Insufficient documentation

## 2022-06-26 DIAGNOSIS — I4510 Unspecified right bundle-branch block: Secondary | ICD-10-CM | POA: Diagnosis not present

## 2022-06-26 DIAGNOSIS — R600 Localized edema: Secondary | ICD-10-CM | POA: Insufficient documentation

## 2022-06-26 DIAGNOSIS — E785 Hyperlipidemia, unspecified: Secondary | ICD-10-CM | POA: Insufficient documentation

## 2022-06-26 MED ORDER — FUROSEMIDE 20 MG PO TABS: 20.0000 mg | ORAL_TABLET | ORAL | 1 refills | Status: DC | PRN

## 2022-06-26 MED ORDER — FUROSEMIDE 20 MG PO TABS
20.0000 mg | ORAL_TABLET | Freq: Every day | ORAL | 1 refills | Status: DC
Start: 2022-06-26 — End: 2022-06-26

## 2022-06-26 MED ORDER — LISINOPRIL 10 MG PO TABS
10.0000 mg | ORAL_TABLET | Freq: Every day | ORAL | 3 refills | Status: DC
Start: 2022-06-26 — End: 2022-07-31

## 2022-06-26 MED ORDER — LISINOPRIL 10 MG PO TABS
10.0000 mg | ORAL_TABLET | Freq: Every day | ORAL | 3 refills | Status: DC
Start: 2022-06-26 — End: 2022-06-26

## 2022-06-26 NOTE — Telephone Encounter (Signed)
*  STAT* If patient is at the pharmacy, call can be transferred to refill team.   1. Which medications need to be refilled? (please list name of each medication and dose if known) furosemide (LASIX) 20 MG tablet  lisinopril (ZESTRIL) 10 MG tablet  2. Which pharmacy/location (including street and city if local pharmacy) is medication to be sent to? [id number] [place] [place] - [address] 3. Do they need a 30 day or 90 day supply? [address]

## 2022-06-26 NOTE — Telephone Encounter (Signed)
Pt's medications were sent to pt's pharmacy as requested. Confirmation received.  

## 2022-06-26 NOTE — Patient Instructions (Signed)
Medication Instructions:  Your physician has recommended you make the following change in your medication:   Start taking lisinopril '10mg'$  daily Take lasix '20mg'$   2 times per day for 3 days then as needed for wt gain of 2lbs in 24 hours or 5lbs in 7 days  *If you need a refill on your cardiac medications before your next appointment, please call your pharmacy*   Lab Work: Bmet 1 week  If you have labs (blood work) drawn today and your tests are completely normal, you will receive your results only by: [place] (if you have MyChart) OR A paper copy in the mail If you have any lab test that is abnormal or we need to change your treatment, we will call you to review the results.  Follow-Up: At [facility], you and your health needs are our priority.  As part of our continuing mission to provide you with exceptional heart care, we have created designated Provider Care Teams.  These Care Teams include your primary Cardiologist (physician) and Advanced Practice Providers (APPs -  Physician Assistants and Nurse Practitioners) who all work together to provide you with the care you need, when you need it.   Your next appointment:   2 week(s)  The format for your next appointment:   In Person  Provider:   Ambrose Pancoast, NP  Other Instructions Weigh daily in the morning before breakfast   Keep a record for your next appointment.

## 2022-06-27 NOTE — Addendum Note (Signed)
Addended by: Wadie Lessen on: 06/27/2022 09:40 AM   Modules accepted: Orders

## 2022-06-28 ENCOUNTER — Encounter: Payer: Medicare Other | Admitting: Physical Therapy

## 2022-06-29 ENCOUNTER — Encounter: Payer: Self-pay | Admitting: Family Medicine
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
## 2022-07-03 ENCOUNTER — Ambulatory Visit: Payer: Medicare Other | Admitting: Family Medicine

## 2022-07-05 ENCOUNTER — Other Ambulatory Visit: Payer: Medicare Other

## 2022-07-06 ENCOUNTER — Ambulatory Visit: Payer: Medicare Other | Attending: Nurse Practitioner

## 2022-07-06 DIAGNOSIS — I10 Essential (primary) hypertension: Secondary | ICD-10-CM

## 2022-07-06 DIAGNOSIS — I4510 Unspecified right bundle-branch block: Secondary | ICD-10-CM | POA: Diagnosis not present

## 2022-07-06 DIAGNOSIS — I4892 Unspecified atrial flutter: Secondary | ICD-10-CM | POA: Diagnosis not present

## 2022-07-07 LAB — BASIC METABOLIC PANEL
BUN/Creatinine Ratio: 19 (ref 10–24)
BUN: 17 mg/dL (ref 10–36)
CO2: 23 mmol/L (ref 20–29)
Calcium: 10.1 mg/dL (ref 8.6–10.2)
Chloride: 104 mmol/L (ref 96–106)
Creatinine, Ser: 0.9 mg/dL (ref 0.76–1.27)
Glucose: 119 mg/dL — ABNORMAL HIGH (ref 70–99)
Potassium: 4.4 mmol/L (ref 3.5–5.2)
Sodium: 141 mmol/L (ref 134–144)
eGFR: 80 mL/min/{1.73_m2} (ref >59)

## 2022-07-10 ENCOUNTER — Ambulatory Visit: Payer: Medicare Other | Admitting: Nurse Practitioner
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
## 2022-07-11 ENCOUNTER — Encounter: Payer: Self-pay | Admitting: General Practice

## 2022-07-11 ENCOUNTER — Ambulatory Visit: Payer: Medicare Other | Attending: General Practice | Admitting: Student

## 2022-07-11 VITALS — BP 128/66 | HR 65 | Ht 73.0 in | Wt 185.8 lb

## 2022-07-11 DIAGNOSIS — I2510 Atherosclerotic heart disease of native coronary artery without angina pectoris: Secondary | ICD-10-CM | POA: Diagnosis not present

## 2022-07-11 DIAGNOSIS — I4892 Unspecified atrial flutter: Secondary | ICD-10-CM | POA: Diagnosis not present

## 2022-07-11 DIAGNOSIS — I480 Paroxysmal atrial fibrillation: Secondary | ICD-10-CM | POA: Insufficient documentation

## 2022-07-11 DIAGNOSIS — R600 Localized edema: Secondary | ICD-10-CM | POA: Insufficient documentation

## 2022-07-11 NOTE — Patient Instructions (Signed)
Medication Instructions:  No Changes *If you need a refill on your cardiac medications before your next appointment, please call your pharmacy*   Lab Work: No Labs If you have labs (blood work) drawn today and your tests are completely normal, you will receive your results only by: [place] (if you have MyChart) OR A paper copy in the mail If you have any lab test that is abnormal or we need to change your treatment, we will call you to review the results.   Testing/Procedures: No Testing   Follow-Up: At [facility], you and your health needs are our priority.  As part of our continuing mission to provide you with exceptional heart care, we have created designated Provider Care Teams.  These Care Teams include your primary Cardiologist (physician) and Advanced Practice Providers (APPs -  Physician Assistants and Nurse Practitioners) who all work together to provide you with the care you need, when you need it.  We recommend signing up for the patient portal called "MyChart".  Sign up information is provided on this After Visit Summary.  MyChart is used to connect with patients for Virtual Visits (Telemedicine).  Patients are able to view lab/test results, encounter notes, upcoming appointments, etc.  Non-urgent messages can be sent to your provider as well.   To learn more about what you can do with MyChart, go to [email or web address].    Your next appointment:   Keep Scheduled Appointment.  The format for your next appointment:   In Person  Provider:   Jenkins Rouge, MD     Other Instructions If weight increases by 3 pounds overnight take 20 mg of Lasix. On Day 2 if weight has not reduced Take 20 mg of Lasix. On Day 3 if there is no change in weight please call office. Slowly resume normal activity.

## 2022-07-13 ENCOUNTER — Ambulatory Visit: Payer: Medicare Other | Admitting: Audiologist

## 2022-07-15 ENCOUNTER — Encounter: Payer: Self-pay | Admitting: Family Medicine

## 2022-07-15 DIAGNOSIS — Z23 Encounter for immunization: Secondary | ICD-10-CM | POA: Diagnosis not present

## 2022-07-18 ENCOUNTER — Other Ambulatory Visit: Payer: Self-pay | Admitting: Cardiovascular Disease

## 2022-07-24 ENCOUNTER — Telehealth: Payer: Self-pay | Admitting: Cardiovascular Disease

## 2022-07-24 NOTE — Telephone Encounter (Signed)
Spoke with pt's wife, [name],DPR.  Pt's wife states recent BP readings were taken last night and this morning prior to medication.  She denies pt complaints of CP, SOB, dizziness or HA.  She reports edema has been intermittent for several months and pt is taking Furosemide for swelling and or weight gain as needed which does help. Weight has been fairly stable.  Edema does usually resolve overnight.  Pt did take Furosemide this morning.  Pt's wife is concerned regarding pt remaining in Aflutter and the amount of fatigue it is causing.  Pt has resumed yard work about 2 hours every other day. Recommended pt continue to monitor and log his BP's and send results through MyChart in the next 3-5 days or call if BP continues to be elevated or pt develops additional symptoms.  Advised pt's wife to obtain most accurate BP is to check BP 2 hours after taking Lisinopril, rest 5-10 minutes prior to checking with both feet on the floor.  Continue current medications as prescribed. Will forward to provider for review and further recommendation.  Pt's wife verbalizes understanding and agrees with current plan.

## 2022-07-24 NOTE — Telephone Encounter (Signed)
Pt c/o BP issue: STAT if pt c/o blurred vision, one-sided weakness or slurred speech  1. What are your last 5 BP readings?  188/78 HR 63 180/72 HR 66  2. Are you having any other symptoms (ex. Dizziness, headache, blurred vision, passed out)? Edema (legs, feet and is taking fluid pill)   3. What is your BP issue? Pt's wife is requesting call back to discuss pt's bp. She believes his bp medicine needs to either be changed or increased in dosage. Requesting call back.

## 2022-07-24 NOTE — Telephone Encounter (Signed)
Will you please call the patient to find out: ~When he got these BP readings and does he have any other leading up to these readings? ~What are his last few weights? ~When did edema in legs begin and how often is he taking the Lasix?  I need this info before making changes.  Thank you!

## 2022-07-24 NOTE — Telephone Encounter (Signed)
No further recommendations at this time. His BP was well-controlled at his last visit. He has an upcoming office visit with Dr. Johnsie Cancel (08/09/2022). Can discuss plan regarding a-flutter going forward at that time.  Thank you!

## 2022-07-24 NOTE — Telephone Encounter (Signed)
Spoke with pt's wife,DPR and advised of providers message as below.  Pt's wife verbalizes understanding and agrees with current plan.
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
## 2022-07-28 ENCOUNTER — Telehealth: Payer: Self-pay | Admitting: Cardiovascular Disease

## 2022-07-28 ENCOUNTER — Telehealth: Payer: Self-pay | Admitting: Pharmacist

## 2022-07-28 NOTE — Telephone Encounter (Signed)
Called patient's wife back. She stated patient's BP's were done about 2 hours after taking lisinopril 10 mg. Patient is also on Lasix 20 mg PRN for edema. Encouraged patient's wife to give lasix if patient has any edema to see if that helps with the BP as well. She stated she would check . Also informed patient's wife that Dr. Johnsie Cancel is out of the office today, and she could also call patient's PCP for advisement. Patient's wife stated it would be fine to get a mychart response if Dr. Johnsie Cancel gets back to them over the weekend. Advised for patient to eat a low salt diet. Patient's wife is unable to do anything with her arm after injuring it from a fall, so they are eating a lot of food that people are bringing them. Will forward to Dr. Johnsie Cancel for advisement.

## 2022-07-28 NOTE — Progress Notes (Signed)
Chronic Care Management Pharmacy Assistant   Name: Sean Lane  MRN: [id number] DOB: [birth year]   Reason for Encounter: Hypertension Adherence Call    Recent office visits:  05/11/2022 OV (PCP) [name], MD; we will update b12- he knows there is a chance insurance will not cover it and need to pay $55.   Recent consult visits:  07/11/2022 OV (Cardiology) Mayra Reel, NP; no medication changes noted.  06/26/2022 OV (Cardiology) Marylu Lund., NP; Patient was started on Zestril 10 mg daily.  05/30/2022 OV (Podiatry) [name], DPM; no medication changes noted.  05/22/2022 OV (Sports Medicine) [name], DO; no medication changes noted.  04/10/2022 OV (Sports Medicine) [name], DO; no medication changes noted.  03/21/2022 OV (Podiatry) [name], DPM; no medication changes noted.  03/02/2022 OV (Podiatry) Felipa Furnace, DPM; no medication changes noted.  01/26/2022 OV (Sports Medicine) [name], DO; no medication changes noted.   Hospital visits:  01/28/2022 ED for Fall -No medications noted.  Medications: Outpatient Encounter Medications as of 07/28/2022  Medication Sig   Accu-Chek Softclix Lancets lancets Check blood sugar once daily. E11.9   acetaminophen (TYLENOL) 650 MG CR tablet Take 1,300 mg by mouth every 8 (eight) hours as needed for pain.   aspirin EC 81 MG tablet Take 81 mg by mouth daily.   Blood Glucose Monitoring Suppl (ACCU-CHEK GUIDE ME) w/Device KIT Use to test blood sugars once daily. Dx: E11.9. Last meter supplied over 5 years ago- please give patient cash pay price if this is not covered or send [place] the script you need and we will sign.   cholecalciferol (VITAMIN D3) 25 MCG (1000 UNIT) tablet Take 2,000 Units by mouth daily.   clobetasol (OLUX) 0.05 % topical foam Apply topically daily.   CREAM BASE EX Apply 1 application topically at bedtime. Diabetics' Dry Skin Relief (applied to feet)    dutasteride (AVODART) 0.5 MG capsule TAKE 1 CAPSULE DAILY   furosemide (LASIX) 20 MG tablet TAKE 1 TABLET BY MOUTH AS NEEDED   glucose blood (ACCU-CHEK GUIDE) test strip Check once daily. E11.9   lisinopril (ZESTRIL) 10 MG tablet Take 1 tablet (10 mg total) by mouth daily.   Naphazoline-Pheniramine (OPCON-A OP) Apply 1 drop to eye daily as needed (allergies).   nitroGLYCERIN (NITROSTAT) 0.4 MG SL tablet Place 1 tablet (0.4 mg total) under the tongue every 5 (five) minutes as needed for chest pain (for chest pain). Use up to 3 dosages, if no relief call 911.   Omega-3 Fatty Acids (FISH OIL) 1200 MG CAPS Take 2 capsules by mouth 2 (two) times daily.   rosuvastatin (CRESTOR) 10 MG tablet TAKE 1 TABLET DAILY   tamsulosin (FLOMAX) 0.4 MG CAPS capsule TAKE 1 CAPSULE DAILY   XARELTO 20 MG TABS tablet TAKE 1 TABLET DAILY WITH   SUPPER   No facility-administered encounter medications on file as of 07/28/2022.   Reviewed chart prior to disease state call. Spoke with patient regarding BP  Recent Office Vitals: BP Readings from Last 3 Encounters:  07/11/22 128/66  06/26/22 128/62  05/22/22 132/66   Pulse Readings from Last 3 Encounters:  07/11/22 65  06/26/22 98  05/22/22 71    Wt Readings from Last 3 Encounters:  07/11/22 185 lb 12.8 oz (84.3 kg)  06/26/22 189 lb (85.7 kg)  05/22/22 189 lb (85.7 kg)     Kidney Function Lab Results  Component Value Date/Time   CREATININE 0.90 07/06/2022  11:55 AM   CREATININE 0.96 05/11/2022 04:38 PM   GFR 68.92 05/11/2022 04:38 PM   GFRNONAA 84 08/27/2017 02:04 PM   GFRAA 97 08/27/2017 02:04 PM       Latest Ref Rng & Units 07/06/2022   11:55 AM 05/11/2022    4:38 PM 10/13/2021    2:44 PM  BMP  Glucose 70 - 99 mg/dL 119  117  150   BUN 10 - 36 mg/dL _0 Creatinine 0.76 - 1.27 mg/dL 0.90  0.96  1.04   BUN/Creat Ratio 10 - 24 19     Sodium 134 - 144 mmol/L 141  140  140   Potassium 3.5 - 5.2 mmol/L 4.4  4.3  4.1   Chloride 96 - 106 mmol/L  104  106  104   CO2 20 - 29 mmol/L _1 Calcium 8.6 - 10.2 mg/dL 10.1  10.0  10.0     Current antihypertensive regimen:  Furosemide 20 mg as needed - restart lasix per patient wife Lisinopril 10 mg daily  How often are you checking your Blood Pressure? daily  Current home BP readings: 153/68 pulse 51 , 153/65 pulse 57, 162/69 pulse 58, 161/68 pulse 61, 163/69 67  What recent interventions/DTPs have been made by any provider to improve Blood Pressure control since last CPP Visit: Per patient wife, patient restarted taking Lasix today (07/28/2022) due to high home blood pressure readings.  Any recent hospitalizations or ED visits since last visit with CPP? No  -Patients wife states she recently fell and broke her shoulder and her arm. She states the patient has been busy taking care of her since her fall. She was not able to schedule a follow up telephone call for the patient at this time. She agreed to schedule at a later time.  Adherence Review: Is the patient currently on ACE/ARB medication? Yes Does the patient have >5 day gap between last estimated fill dates? No   Care Gaps: Medicare Annual Wellness: Next due 03/31/2023 Ophthalmology Exam: Next due on 11/12/2022 Foot Exam: Next due on 08/15/2022 Hemoglobin A1C: 6.6% on 05/11/2022  Future Appointments  Date Time Provider [place]  08/08/2022  3:45 PM [name], DPM [facility]  08/09/2022  8:30 AM [name], MD [facility]  08/24/2022  1:30 PM [name], DO [facility] None  11/09/2022  2:00 PM [name], MD [facility]  05/04/2023  1:30 PM [facility] HEALTH COACH [facility]   Star Rating Drugs: Lisinopril 10 mg last filled 06/26/2022 90 DS Rosuvastatin 10 mg last filled 05/23/2022 90 DS  April D Calhoun, [place] Pharmacist Assistant [phone]

## 2022-07-28 NOTE — Telephone Encounter (Signed)
Wife is calling in with patient bp   11/13 153/68 HR 51 11/14 153/65 HR 57 11/15 162/69 HR 58 11/16 161/68 HR 61 11/17 163/69 HR 67  Please advise

## 2022-07-31 MED ORDER — LISINOPRIL 20 MG PO TABS
20.0000 mg | ORAL_TABLET | Freq: Every day | ORAL | 3 refills | Status: DC
Start: 2022-07-31 — End: 2022-08-09

## 2022-07-31 NOTE — Telephone Encounter (Signed)
[name], MD  You2 days ago   Can increase lisinoprol to 20 mg  take 10 bid until needs to fill new script   Called patient and his wife with Dr. Kyla Balzarine advisement. Patient and wife verbalized understanding.

## 2022-08-08 ENCOUNTER — Telehealth: Payer: Self-pay | Admitting: Cardiovascular Disease

## 2022-08-08 ENCOUNTER — Ambulatory Visit: Payer: Medicare Other | Admitting: Podiatry

## 2022-08-08 DIAGNOSIS — I10 Essential (primary) hypertension: Secondary | ICD-10-CM

## 2022-08-08 NOTE — Telephone Encounter (Signed)
Called patient's wife back to let her know that Dr. Johnsie Cancel is fine with patient taking an extra lisinopril. Also informed her that a referral had been placed for care management to see if they could get some assistance in the home. Patient's wife thank [place] for the call back.

## 2022-08-08 NOTE — Telephone Encounter (Signed)
Called patient's wife. She was wondering if patient should take another lisinopril to help with his BP. Asked if patient had taken his PRN lasix today. Patient has been taking his lasix daily here and there, and he took it today around noon. Asked what the BP is after taking lasix, but they have not checked it. Asked patient's wife to check BP and we will call them back, since this will take sometime due to patient having to be reached by phone to come up stairs to where his wife is since she is unable to take the stairs safely at this time. Informed her that patient's BP might be up due to their current situation. Wife stated she wish they could get some help around the house while she is having issues with her arm. Will reach out to social worker to see what they advise about any services out there that can help in cases like these. Will send message to Dr. Johnsie Cancel for advisement on taking any extra lisinopril.

## 2022-08-08 NOTE — Telephone Encounter (Signed)
Pt c/o BP issue: STAT if pt c/o blurred vision, one-sided weakness or slurred speech  1. What are your last 5 BP readings?  11/28 180-80 HR 59 181/83 HR 62 11/27 179/76 HR 55 11/26 163/69 HR 45 11/25 160/69 HR 51  2. Are you having any other symptoms (ex. Dizziness, headache, blurred vision, passed out)? Fatigued and feeling bad   3. What is your BP issue? Wife states she believes hypertension may be coming from her not being able to cook due to a broken wrist causing them to eat things others have made like ham. She is wanting to know if patient should take an additional 10 mg's of lisinopril today for BP until he comes in tomorrow.

## 2022-08-09 ENCOUNTER — Telehealth: Payer: Self-pay | Admitting: *Deleted

## 2022-08-09 ENCOUNTER — Encounter: Payer: Self-pay | Admitting: Cardiovascular Disease

## 2022-08-09 ENCOUNTER — Ambulatory Visit: Payer: Medicare Other | Attending: Cardiovascular Disease | Admitting: Cardiovascular Disease

## 2022-08-09 ENCOUNTER — Ambulatory Visit: Payer: Self-pay | Admitting: Licensed Clinical Social Worker

## 2022-08-09 VITALS — BP 150/90 | HR 88 | Ht 73.0 in | Wt 185.0 lb

## 2022-08-09 DIAGNOSIS — Z7901 Long term (current) use of anticoagulants: Secondary | ICD-10-CM | POA: Diagnosis not present

## 2022-08-09 DIAGNOSIS — I480 Paroxysmal atrial fibrillation: Secondary | ICD-10-CM | POA: Diagnosis not present

## 2022-08-09 DIAGNOSIS — I4510 Unspecified right bundle-branch block: Secondary | ICD-10-CM | POA: Diagnosis not present

## 2022-08-09 DIAGNOSIS — I2510 Atherosclerotic heart disease of native coronary artery without angina pectoris: Secondary | ICD-10-CM | POA: Insufficient documentation

## 2022-08-09 DIAGNOSIS — Z5181 Encounter for therapeutic drug level monitoring: Secondary | ICD-10-CM | POA: Insufficient documentation

## 2022-08-09 DIAGNOSIS — I10 Essential (primary) hypertension: Secondary | ICD-10-CM | POA: Diagnosis not present

## 2022-08-09 DIAGNOSIS — Z01810 Encounter for preprocedural cardiovascular examination: Secondary | ICD-10-CM | POA: Diagnosis not present

## 2022-08-09 MED ORDER — LISINOPRIL 40 MG PO TABS: 40.0000 mg | ORAL_TABLET | Freq: Every day | ORAL | 3 refills | Status: DC

## 2022-08-09 MED ORDER — HYDRALAZINE HCL 10 MG PO TABS: 10.0000 mg | ORAL_TABLET | ORAL | 3 refills | Status: DC | PRN

## 2022-08-09 NOTE — Patient Outreach (Signed)
SW reviewed chart with care guide in preparation for scheduled appointment.   Milus Height, Arita Miss, MSW, [place]  Social Worker [facility] Care Management  [phone]

## 2022-08-09 NOTE — Patient Instructions (Signed)
Medication Instructions:  Your physician has recommended you make the following change in your medication:  1-INCREASE Lisinopril 40 mg by mouth daily. 2-Take Hydralazine 10 mg by mouth if SBP is over 180.  *If you need a refill on your cardiac medications before your next appointment, please call your pharmacy*  Lab Work: If you have labs (blood work) drawn today and your tests are completely normal, you will receive your results only by: [place] (if you have MyChart) OR A paper copy in the mail If you have any lab test that is abnormal or we need to change your treatment, we will call you to review the results.  Testing/Procedures: None ordered today.  Follow-Up: At [facility], you and your health needs are our priority.  As part of our continuing mission to provide you with exceptional heart care, we have created designated Provider Care Teams.  These Care Teams include your primary Cardiologist (physician) and Advanced Practice Providers (APPs -  Physician Assistants and Nurse Practitioners) who all work together to provide you with the care you need, when you need it.  We recommend signing up for the patient portal called "MyChart".  Sign up information is provided on this After Visit Summary.  MyChart is used to connect with patients for Virtual Visits (Telemedicine).  Patients are able to view lab/test results, encounter notes, upcoming appointments, etc.  Non-urgent messages can be sent to your provider as well.   To learn more about what you can do with MyChart, go to [email or web address].    Your next appointment:   6 month(s)  The format for your next appointment:   In Person  Provider:   Jenkins Rouge, MD      Important Information About Sugar

## 2022-08-09 NOTE — Progress Notes (Signed)
  Care Coordination  Outreach Note  08/09/2022 Name: Sean Lane MRN: [id number] DOB: 26-Jun-1928   Care Coordination Outreach Attempts: An unsuccessful telephone outreach was attempted today to offer the patient information about available care coordination services as a benefit of their health plan.   Follow Up Plan:  Additional outreach attempts will be made to offer the patient care coordination information and services.   Encounter Outcome:  No Answer  [place]  Direct Dial: [phone]

## 2022-08-11 ENCOUNTER — Ambulatory Visit: Payer: Self-pay | Admitting: Licensed Clinical Social Worker

## 2022-08-11 ENCOUNTER — Ambulatory Visit (INDEPENDENT_AMBULATORY_CARE_PROVIDER_SITE_OTHER): Payer: Medicare Other | Admitting: Physical Therapy

## 2022-08-11 DIAGNOSIS — M6281 Muscle weakness (generalized): Secondary | ICD-10-CM | POA: Diagnosis not present

## 2022-08-11 DIAGNOSIS — R2689 Other abnormalities of gait and mobility: Secondary | ICD-10-CM

## 2022-08-11 DIAGNOSIS — M5459 Other low back pain: Secondary | ICD-10-CM | POA: Diagnosis not present
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
## 2022-08-11 NOTE — Progress Notes (Signed)
  Care Coordination   Note   08/11/2022 Name: Sean Lane MRN: [id number] DOB: [birth year]  Sean Lane is a 88 y.o. year old male who sees Hunter, Brayton Mars, MD for primary care. I reached out to [name] by phone today to offer care coordination services.  Sean Lane was given information about Care Coordination services today including:   The Care Coordination services include support from the care team which includes your Nurse Coordinator, Clinical Social Worker, or Pharmacist.  The Care Coordination team is here to help remove barriers to the health concerns and goals most important to you. Care Coordination services are voluntary, and the patient may decline or stop services at any time by request to their care team member.   Care Coordination Consent Status: Patient agreed to services and verbal consent obtained.   Follow up plan:  Telephone appointment with care coordination team member scheduled for:  08/14/22 with SW and 08/15/22  Encounter Outcome:  Pt. Scheduled  [place]  Direct Dial: [phone]

## 2022-08-11 NOTE — Patient Outreach (Signed)
  Care Coordination   Initial Visit Note   08/11/2022 Name: Sean Lane MRN: [id number] DOB: Jan 09, 1928  Sean Lane is a 88 y.o. year old male who sees Hunter, Brayton Mars, MD for primary care. I spoke with  [employer]als [employer] wheels  What matters to the patients health and wellness today?  Meals   SW placed referrals for [employer]als [employer] on today.   SDOH assessments and interventions completed:  Yes     Care Coordination Interventions:  Yes, provided   Follow up plan: Follow up call scheduled for 12/04    Encounter Outcome:  Pt. Scheduled

## 2022-08-13 ENCOUNTER — Encounter: Payer: Self-pay | Admitting: Physical Therapy

## 2022-08-14 ENCOUNTER — Telehealth: Payer: Self-pay | Admitting: Family Medicine

## 2022-08-14 ENCOUNTER — Ambulatory Visit: Payer: Self-pay | Admitting: Licensed Clinical Social Worker

## 2022-08-14 ENCOUNTER — Ambulatory Visit (INDEPENDENT_AMBULATORY_CARE_PROVIDER_SITE_OTHER): Payer: Medicare Other | Admitting: Physical Therapy

## 2022-08-14 ENCOUNTER — Encounter: Payer: Self-pay | Admitting: Physical Therapy

## 2022-08-14 DIAGNOSIS — M6281 Muscle weakness (generalized): Secondary | ICD-10-CM

## 2022-08-14 DIAGNOSIS — M5459 Other low back pain: Secondary | ICD-10-CM

## 2022-08-14 DIAGNOSIS — R2689 Other abnormalities of gait and mobility: Secondary | ICD-10-CM
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
## 2022-08-14 NOTE — Telephone Encounter (Signed)
Pt was advised to go to UC  Patient Name: Sean Lane Gender: Male DOB: Nov 21, 1927 Age: 88 Y 1 M 17 D Return Phone Number: [phone] (Primary), [phone] (Secondary) Address: City/ State/ Zip: [place]  [address] Client [facility] [place] Client [facility] [place] Night Provider [name]- MD Contact Type Call Who Is Calling Patient / Member / Family / Caregiver Call Type Triage / Clinical Caller Name [name] Akerson Relationship To Patient Spouse Return Phone Number [phone] (Secondary) Chief Complaint Urination Pain Reason for Call Symptomatic / Request for [place] states patient's prescription takes two medications. Has difficulty urinating, and painful. It should be due to eating foot that has had more sodium than what patient takes. Okay to take and extra one of those medications? Translation No Nurse Assessment Nurse: Lissa Merlin, RN, Abigail Date/Time (Eastern Time): 08/12/2022 9:26:54 AM Confirm and document reason for call. If symptomatic, describe symptoms. ---Caller states that husband is having painful urination and problems urinating. States that he ate something that seemed to have a large amount of sodium and has a large amount of problems urinating and has been dribbling today with pain. States that he is currently taking tamsulosin, dutasteride capsules. States that he also has been taking lasix. States that htey want to know what to do. States that at approximately 815 he was able to urinate some but it was still just dribbles. States that he waws able to urinate a few drops at a time over 15 minutes. No fever. Does the patient have any new or worsening symptoms? ---Yes Will a triage be completed? ---Yes Related visit to physician within the last 2 weeks? ---Yes Does the PT have any chronic conditions? (i.e. diabetes, asthma, this  includes High risk factors for pregnancy, etc.) ---Yes List chronic conditions. ---A Flutter HTN Diabetes Poor balance Bradycardia Is this a behavioral health or substance abuse call? ---No Guidelines Guideline Title Affirmed Question Affirmed Notes Nurse Date/Time Eilene Ghazi Time) Urination Pain - Male Diabetes mellitus or weak immune system (e.g., HIV positive, cancer chemo, splenectomy, organ transplant, chronic steroids) Lissa Merlin, RN, Abigail 08/12/2022 9:40:19 AM Disp. Time Eilene Ghazi Time) Disposition Final User 08/12/2022 9:47:04 AM See HCP within 4 Hours (or PCP triage) Yes Lissa Merlin, RN, Abigail Final Disposition 08/12/2022 9:47:04 AM See HCP within 4 Hours (or PCP triage) Yes Lissa Merlin, RN, Abigail Caller Disagree/Comply Comply Caller Understands Yes PreDisposition Did not know what to do Care Advice Given Per Guideline SEE HCP (OR PCP TRIAGE) WITHIN 4 HOURS: * IF OFFICE WILL BE OPEN: You need to be seen within the next 3 or 4 hours. Call your doctor (or NP/PA) now or as soon as the office opens. CALL BACK IF: * You become worse CARE ADVICE given per Urination Pain - Male (Adult) guideline. Referrals [facility] [place] [place]
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
## 2022-08-15 ENCOUNTER — Ambulatory Visit: Payer: Self-pay

## 2022-08-15 NOTE — Telephone Encounter (Signed)
LVM to schedule an appt
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
## 2022-08-15 NOTE — Telephone Encounter (Signed)
Spouse called back. I informed her that PCP would like pt to be seen asap due to symptoms. Informed her that PCP isn't available until 12/08. I offered caller an OV with another provider on 12/06.   Caller states she prefers to see Dr. Yong Channel on 08/18/22. Symptoms have been onset for 3 days.   Please Advise.
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
## 2022-08-15 NOTE — Telephone Encounter (Signed)
I have an administrative half day on Wednesday-I have an early afternoon appointment and I do not think I can work him in-really would prefer for him not to wait until the eighth with his symptoms-urgent care or seeing an alternative provider tomorrow would probably make the most sense

## 2022-08-15 NOTE — Patient Instructions (Signed)
Visit Information  Thank you for taking time to visit with me today. Please don't hesitate to contact me if I can be of assistance to you.   Following are the goals we discussed today:   Goals Addressed             This Visit's Progress    Care Coordination Activities-increased B/P and not feeling good       Care Coordination Interventions: Advised patient to call primary care provider to schedule appointment to check on urinary hesitancy  Social Work referral for pt has had calls with BSW and has referral for Meals on Wheels now Evaluation of current treatment plan related to hypertension self management and patient's adherence to plan as established by provider Reviewed medications with patient and discussed importance of compliance Advised patient, providing education and rationale, to monitor blood pressure daily and record, calling PCP for findings outside established parameters Advised patient to discuss B/P readings if elevated with provider Provided education on prescribed diet low sodium heart healthy Assessed social determinant of health barriers Reviewed to pace activity and to use cane           Our next appointment is by telephone on 08/28/22 at 3:45 PM  Please call the care guide team at [phone] if you need to cancel or reschedule your appointment.   If you are experiencing a Mental Health or [place] or need someone to talk to, please call the Suicide and Crisis Lifeline: 988 call the [place] National Suicide Prevention Lifeline: 847-626-8587 or TTY: (253)818-9664 TTY 3073883081) to talk to a trained counselor call 1-800-273-TALK (toll free, 24 hour hotline) go to [facility] [address], [place] [phone]) call 911   Patient verbalizes understanding of instructions and care plan provided today and agrees to view in [place]. Active MyChart status and patient understanding of how to access  instructions and care plan via MyChart confirmed with patient.     Telephone follow up appointment with care management team member scheduled for: 08/28/22  Peter Garter RN, [place] [place], [place] [facility] [phone]

## 2022-08-16 ENCOUNTER — Encounter (HOSPITAL_COMMUNITY): Payer: Self-pay

## 2022-08-16 ENCOUNTER — Ambulatory Visit (HOSPITAL_COMMUNITY)
Admission: EM | Admit: 2022-08-16 | Discharge: 2022-08-16 | Disposition: A | Discharge status: Home / Self Care | Payer: Medicare Other | Attending: Physician Assistant | Admitting: Physician Assistant

## 2022-08-16 ENCOUNTER — Ambulatory Visit: Payer: Medicare Other | Admitting: Family

## 2022-08-16 ENCOUNTER — Encounter: Payer: Self-pay | Admitting: Family Medicine

## 2022-08-16 DIAGNOSIS — R339 Retention of urine, unspecified: Secondary | ICD-10-CM

## 2022-08-16 DIAGNOSIS — R3129 Other microscopic hematuria: Secondary | ICD-10-CM | POA: Diagnosis not present

## 2022-08-16 LAB — POCT URINALYSIS DIPSTICK, ED / UC
Bilirubin Urine: NEGATIVE
Glucose, UA: NEGATIVE mg/dL
Ketones, ur: 40 mg/dL — AB
Nitrite: NEGATIVE
Protein, ur: 30 mg/dL — AB
Specific Gravity, Urine: 1.025 (ref 1.005–1.030)
Urobilinogen, UA: 0.2 mg/dL (ref 0.0–1.0)
pH: 5.5 (ref 5.0–8.0)

## 2022-08-16 NOTE — Discharge Instructions (Addendum)
Advised to call [facility] at [phone] for evaluation of urinary retention if it continues over the next several days. Advised to stop caffeinated products of the evening, and make sure that there is no salt intake of the evening at suppertime. These products tend to cause urinary retention.  Vies to return to urgent care as needed.

## 2022-08-16 NOTE — Telephone Encounter (Signed)
Caller reached back out for an update. I was able to inform caller of Dr. Ansel Bong recommendation below in either going to an UC or seeing another provider on 12/06.   Caller states: -Patient is really not on board with going to the urgent care and really prefers to see Dr. Yong Channel.  - Patient and herself would like to be scheduled for an opening with Dr. Yong Channel on 12/08.   Due to the caller declining Dr. Ansel Bong recommendation I informed caller that I will need to escalate this situation to my supervisor as well as Dr. Yong Channel. Caller verbalized understanding, she is expecting a callback.    Please advise.

## 2022-08-16 NOTE — ED Triage Notes (Signed)
Pt states difficulty urinating for the past 4 days on and off during the day. On tamsulosin and dutasteride.

## 2022-08-16 NOTE — Telephone Encounter (Signed)
Thankful willing to be seen- appreciate Stephanie's help/expertise
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
## 2022-08-17 ENCOUNTER — Ambulatory Visit (INDEPENDENT_AMBULATORY_CARE_PROVIDER_SITE_OTHER): Payer: Medicare Other | Admitting: Family Medicine

## 2022-08-17 VITALS — BP 150/80 | HR 56 | Ht 73.0 in | Wt 182.0 lb

## 2022-08-17 DIAGNOSIS — M9902 Segmental and somatic dysfunction of thoracic region: Secondary | ICD-10-CM

## 2022-08-17 DIAGNOSIS — I2510 Atherosclerotic heart disease of native coronary artery without angina pectoris: Secondary | ICD-10-CM

## 2022-08-17 DIAGNOSIS — R972 Elevated prostate specific antigen [PSA]: Secondary | ICD-10-CM

## 2022-08-17 DIAGNOSIS — M5136 Other intervertebral disc degeneration, lumbar region: Secondary | ICD-10-CM

## 2022-08-17 DIAGNOSIS — M9904 Segmental and somatic dysfunction of sacral region: Secondary | ICD-10-CM

## 2022-08-17 DIAGNOSIS — R3916 Straining to void: Secondary | ICD-10-CM | POA: Diagnosis not present

## 2022-08-17 DIAGNOSIS — M9901 Segmental and somatic dysfunction of cervical region: Secondary | ICD-10-CM

## 2022-08-17 DIAGNOSIS — M2550 Pain in unspecified joint: Secondary | ICD-10-CM

## 2022-08-17 DIAGNOSIS — N401 Enlarged prostate with lower urinary tract symptoms: Secondary | ICD-10-CM | POA: Diagnosis not present

## 2022-08-17 DIAGNOSIS — M9903 Segmental and somatic dysfunction of lumbar region: Secondary | ICD-10-CM

## 2022-08-17 DIAGNOSIS — N200 Calculus of kidney: Secondary | ICD-10-CM | POA: Diagnosis not present

## 2022-08-17 LAB — CBC WITH DIFFERENTIAL/PLATELET
Basophils Absolute: 0.1 10*3/uL (ref 0.0–0.1)
Basophils Relative: 1.4 % (ref 0.0–3.0)
Eosinophils Absolute: 0.1 10*3/uL (ref 0.0–0.7)
Eosinophils Relative: 1.9 % (ref 0.0–5.0)
HCT: 37.8 % — ABNORMAL LOW (ref 39.0–52.0)
Hemoglobin: 12.2 g/dL — ABNORMAL LOW (ref 13.0–17.0)
Lymphocytes Relative: 19.1 % (ref 12.0–46.0)
Lymphs Abs: 0.8 10*3/uL (ref 0.7–4.0)
MCHC: 32.4 g/dL (ref 30.0–36.0)
MCV: 81.9 fl (ref 78.0–100.0)
Monocytes Absolute: 0.3 10*3/uL (ref 0.1–1.0)
Monocytes Relative: 6.8 % (ref 3.0–12.0)
Neutro Abs: 3.1 10*3/uL (ref 1.4–7.7)
Neutrophils Relative %: 70.8 % (ref 43.0–77.0)
Platelets: 212 10*3/uL (ref 150.0–400.0)
RBC: 4.62 Mil/uL (ref 4.22–5.81)
RDW: 19.1 % — ABNORMAL HIGH (ref 11.5–15.5)
WBC: 4.4 10*3/uL (ref 4.0–10.5)

## 2022-08-17 LAB — COMPREHENSIVE METABOLIC PANEL
ALT: 10 U/L (ref 0–53)
AST: 14 U/L (ref 0–37)
Albumin: 4.4 g/dL (ref 3.5–5.2)
Alkaline Phosphatase: 93 U/L (ref 39–117)
BUN: 16 mg/dL (ref 6–23)
CO2: 31 mEq/L (ref 19–32)
Calcium: 9.7 mg/dL (ref 8.4–10.5)
Chloride: 104 mEq/L (ref 96–112)
Creatinine, Ser: 0.85 mg/dL (ref 0.40–1.50)
GFR: 75.63 mL/min (ref >60.00)
Glucose, Bld: 146 mg/dL — ABNORMAL HIGH (ref 70–99)
Potassium: 3.6 mEq/L (ref 3.5–5.1)
Sodium: 142 mEq/L (ref 135–145)
Total Bilirubin: 0.6 mg/dL (ref 0.2–1.2)
Total Protein: 6.6 g/dL (ref 6.0–8.3)

## 2022-08-17 LAB — SEDIMENTATION RATE: Sed Rate: 30 mm/hr — ABNORMAL HIGH (ref 0–20)

## 2022-08-17 LAB — PSA: PSA: 4.91 ng/mL — ABNORMAL HIGH (ref 0.10–4.00)

## 2022-08-17 NOTE — Assessment & Plan Note (Signed)
History of the degenerative disc disease.  Reviewing patient's chart though there is a recent urgent care visit for urinary retention.  He is seeing urology in the near future.  At this point I do want to get some laboratory workup to make sure nothing else is contributing and make sure that patient is not having any hydronephrosis of the kidneys that could be also difficulty with patient having benign prostate hypertrophy.  Labs ordered today but did respond relatively well to home exercises.  Restart Cymbalta.  Patient previously had difficulty with nightmares but it was helping his pain significantly more and would like to see how this responds.  Follow-up with me again in 4 to 6 weeks

## 2022-08-17 NOTE — Patient Instructions (Addendum)
[facility] today May contact you about antibiotics See me again in 5 weeks

## 2022-08-18 ENCOUNTER — Encounter: Payer: Medicare Other | Admitting: Physical Therapy

## 2022-08-20 ENCOUNTER — Encounter: Payer: Self-pay | Admitting: Cardiovascular Disease

## 2022-08-21 ENCOUNTER — Encounter: Payer: Medicare Other | Admitting: Physical Therapy

## 2022-08-22 ENCOUNTER — Other Ambulatory Visit: Payer: Self-pay

## 2022-08-22 ENCOUNTER — Encounter: Payer: Self-pay | Admitting: Family Medicine

## 2022-08-22 MED ORDER — DULOXETINE HCL 20 MG PO CPEP: 20.0000 mg | ORAL_CAPSULE | Freq: Every day | ORAL | 0 refills | Status: DC

## 2022-08-23 ENCOUNTER — Encounter: Payer: Medicare Other | Admitting: Physical Therapy

## 2022-08-24 ENCOUNTER — Ambulatory Visit: Payer: Medicare Other | Admitting: Family Medicine

## 2022-08-28 ENCOUNTER — Ambulatory Visit: Payer: Self-pay

## 2022-08-28 ENCOUNTER — Encounter: Payer: Medicare Other | Admitting: Physical Therapy

## 2022-08-28 NOTE — Patient Outreach (Signed)
  Care Coordination   Follow Up Visit Note   08/28/2022 Name: Sean Lane MRN: [id number] DOB: 28-Sep-1927  Sean Lane is a 88 y.o. year old male who sees Hunter, Brayton Mars, MD for primary care. I spoke with  [name] by phone today.  What matters to the patients health and wellness today?  Pt states that his urinary retention is better now that he is taking his  tamsulosin in the evening as directed by urology.  States his pain is improving since he restarted the Cymbalta.  States his B/P is improved to 150-160/66 range. States he is having less stress taking care of his wife and is able to prepare low sodium food   Goals Addressed             This Visit's Progress    COMPLETED: Care Coordination Activities-increased B/P and not feeling good       Care Coordination Interventions: Advised patient to call primary care provider if urinary hesitancy starts to worsen again  Evaluation of current treatment plan related to hypertension self management and patient's adherence to plan as established by provider Provided education to patient re: stroke prevention, s/s of heart attack and stroke Reviewed medications with patient and discussed importance of compliance Advised patient, providing education and rationale, to monitor blood pressure daily and record, calling PCP for findings outside established parameters Reviewed scheduled/upcoming provider appointments including:  Advised patient to discuss B/P readings if elevated with provider Provided education on prescribed diet low sodium heart healthy Reinforced to pace activity and to use cane  Pt and wife have good knowledge when to contact providers and no further care coordination needs at this time          SDOH assessments and interventions completed:  No     Care Coordination Interventions:  Yes, provided   Follow up plan: No further intervention required.   Encounter Outcome:  Pt. Visit Completed   Peter Garter RN, BSN,CCM, CDE Care Management Coordinator [place] [facility] [phone]

## 2022-08-28 NOTE — Patient Instructions (Signed)
Visit Information  Thank you for taking time to visit with me today. Please don't hesitate to contact me if I can be of assistance to you.   Following are the goals we discussed today:   Goals Addressed             This Visit's Progress    COMPLETED: Care Coordination Activities-increased B/P and not feeling good       Care Coordination Interventions: Advised patient to call primary care provider if urinary hesitancy starts to worsen again  Evaluation of current treatment plan related to hypertension self management and patient's adherence to plan as established by provider Provided education to patient re: stroke prevention, s/s of heart attack and stroke Reviewed medications with patient and discussed importance of compliance Advised patient, providing education and rationale, to monitor blood pressure daily and record, calling PCP for findings outside established parameters Reviewed scheduled/upcoming provider appointments including:  Advised patient to discuss B/P readings if elevated with provider Provided education on prescribed diet low sodium heart healthy Reinforced to pace activity and to use cane  Pt and wife have good knowledge when to contact providers and no further care coordination needs at this time          If you are experiencing a Mental Health or [place] or need someone to talk to, please call the Suicide and Crisis Lifeline: 988 call the [place] National Suicide Prevention Lifeline: (707)140-7293 or TTY: 480-161-0362 TTY (650) 440-1869) to talk to a trained counselor call 1-800-273-TALK (toll free, 24 hour hotline) go to [facility] [address], [place] [id number]) call 911   Patient verbalizes understanding of instructions and care plan provided today and agrees to view in [place]. Active MyChart status and patient understanding of how to access instructions and care plan via MyChart confirmed with  patient.     No further follow up required:    Peter Garter RN, [place], [place] [facility] [phone]

## 2022-08-30 ENCOUNTER — Ambulatory Visit (INDEPENDENT_AMBULATORY_CARE_PROVIDER_SITE_OTHER): Payer: Medicare Other | Admitting: Podiatry

## 2022-08-30 ENCOUNTER — Encounter: Payer: Self-pay | Admitting: Podiatry

## 2022-08-30 DIAGNOSIS — M79674 Pain in right toe(s): Secondary | ICD-10-CM | POA: Diagnosis not present

## 2022-08-30 DIAGNOSIS — M79675 Pain in left toe(s): Secondary | ICD-10-CM

## 2022-08-30 DIAGNOSIS — D689 Coagulation defect, unspecified: Secondary | ICD-10-CM | POA: Diagnosis not present

## 2022-08-30 DIAGNOSIS — B351 Tinea unguium: Secondary | ICD-10-CM

## 2022-08-30 DIAGNOSIS — E1151 Type 2 diabetes mellitus with diabetic peripheral angiopathy without gangrene: Secondary | ICD-10-CM

## 2022-08-30 NOTE — Progress Notes (Signed)
This patient returns to my office for at risk foot care.  This patient requires this care by a professional since this patient will be at risk due to having type 2 diabetes and coagulation defect.  Patient is taking xarelto.  This patient presents to the office with his wife.  This patient is unable to cut nails himself since the patient cannot reach his nails.These nails are painful walking and wearing shoes.  This patient presents for at risk foot care today.  General Appearance  Alert, conversant and in no acute stress.  Vascular  Dorsalis pedis   pulses are  weakly palpable  bilaterally. Posterior tibial pulses are absent  Bilaterally. Capillary return is within normal limits  bilaterally. Temperature is within normal limits  bilaterally.  Neurologic  Senn-Weinstein monofilament wire test diminished   bilaterally. Muscle power within normal limits bilaterally.  Nails Thick disfigured discolored nails with subungual debris  from hallux to fifth toes bilaterally. Hammer toes 2-4  B/L.  Orthopedic  No limitations of motion  feet .  No crepitus or effusions noted.  No bony pathology or digital deformities noted.  Skin  normotropic skin with no porokeratosis noted bilaterally.  No signs of infections or ulcers noted.     Onychomycosis  Pain in right toes  Pain in left toes  Consent was obtained for treatment procedures.   Mechanical debridement of nails 1-5  bilaterally performed with a nail nipper.  Filed with dremel without incident.    Return office visit   10 weeks                 Told patient to return for periodic foot care and evaluation due to potential at risk complications.   [name] DPM

## 2022-08-31 ENCOUNTER — Emergency Department (HOSPITAL_BASED_OUTPATIENT_CLINIC_OR_DEPARTMENT_OTHER)
Admission: EM | Admit: 2022-08-31 | Discharge: 2022-08-31 | Disposition: A | Discharge status: Home / Self Care | Payer: Medicare Other | Attending: Emergency Medicine | Admitting: Emergency Medicine

## 2022-08-31 ENCOUNTER — Emergency Department (HOSPITAL_BASED_OUTPATIENT_CLINIC_OR_DEPARTMENT_OTHER): Payer: Medicare Other | Admitting: Radiology

## 2022-08-31 ENCOUNTER — Emergency Department (HOSPITAL_BASED_OUTPATIENT_CLINIC_OR_DEPARTMENT_OTHER): Payer: Medicare Other

## 2022-08-31 ENCOUNTER — Encounter (HOSPITAL_BASED_OUTPATIENT_CLINIC_OR_DEPARTMENT_OTHER): Payer: Self-pay | Admitting: Emergency Medicine

## 2022-08-31 ENCOUNTER — Other Ambulatory Visit: Payer: Self-pay

## 2022-08-31 ENCOUNTER — Encounter: Payer: Medicare Other | Admitting: Physical Therapy

## 2022-08-31 DIAGNOSIS — R0602 Shortness of breath: Secondary | ICD-10-CM | POA: Diagnosis not present

## 2022-08-31 DIAGNOSIS — I2510 Atherosclerotic heart disease of native coronary artery without angina pectoris: Secondary | ICD-10-CM | POA: Diagnosis not present

## 2022-08-31 DIAGNOSIS — Z7901 Long term (current) use of anticoagulants: Secondary | ICD-10-CM | POA: Diagnosis not present

## 2022-08-31 DIAGNOSIS — Z7982 Long term (current) use of aspirin: Secondary | ICD-10-CM | POA: Insufficient documentation

## 2022-08-31 DIAGNOSIS — R339 Retention of urine, unspecified: Secondary | ICD-10-CM | POA: Diagnosis not present

## 2022-08-31 DIAGNOSIS — R600 Localized edema: Secondary | ICD-10-CM | POA: Diagnosis not present

## 2022-08-31 LAB — CBC WITH DIFFERENTIAL/PLATELET
Abs Immature Granulocytes: 0.01 10*3/uL (ref 0.00–0.07)
Basophils Absolute: 0.1 10*3/uL (ref 0.0–0.1)
Basophils Relative: 1 %
Eosinophils Absolute: 0 10*3/uL (ref 0.0–0.5)
Eosinophils Relative: 1 %
HCT: 36.2 % — ABNORMAL LOW (ref 39.0–52.0)
Hemoglobin: 11.5 g/dL — ABNORMAL LOW (ref 13.0–17.0)
Immature Granulocytes: 0 %
Lymphocytes Relative: 9 %
Lymphs Abs: 0.6 10*3/uL — ABNORMAL LOW (ref 0.7–4.0)
MCH: 26.9 pg (ref 26.0–34.0)
MCHC: 31.8 g/dL (ref 30.0–36.0)
MCV: 84.8 fL (ref 80.0–100.0)
Monocytes Absolute: 0.4 10*3/uL (ref 0.1–1.0)
Monocytes Relative: 7 %
Neutro Abs: 4.9 10*3/uL (ref 1.7–7.7)
Neutrophils Relative %: 82 %
Platelets: 176 10*3/uL (ref 150–400)
RBC: 4.27 MIL/uL (ref 4.22–5.81)
RDW: 18.3 % — ABNORMAL HIGH (ref 11.5–15.5)
WBC: 5.9 10*3/uL (ref 4.0–10.5)
nRBC: 0 % (ref 0.0–0.2)

## 2022-08-31 LAB — BASIC METABOLIC PANEL
Anion gap: 11 (ref 5–15)
BUN: 15 mg/dL (ref 8–23)
CO2: 30 mmol/L (ref 22–32)
Calcium: 9.8 mg/dL (ref 8.9–10.3)
Chloride: 101 mmol/L (ref 98–111)
Creatinine, Ser: 0.78 mg/dL (ref 0.61–1.24)
GFR, Estimated: >60 mL/min (ref >60)
Glucose, Bld: 150 mg/dL — ABNORMAL HIGH (ref 70–99)
Potassium: 3.2 mmol/L — ABNORMAL LOW (ref 3.5–5.1)
Sodium: 142 mmol/L (ref 135–145)

## 2022-08-31 LAB — BRAIN NATRIURETIC PEPTIDE: B Natriuretic Peptide: 149.3 pg/mL — ABNORMAL HIGH (ref 0.0–100.0)

## 2022-08-31 LAB — URINALYSIS, ROUTINE W REFLEX MICROSCOPIC
Bilirubin Urine: NEGATIVE
Glucose, UA: NEGATIVE mg/dL
Nitrite: NEGATIVE
Protein, ur: 30 mg/dL — AB
RBC / HPF: >50 RBC/hpf — ABNORMAL HIGH (ref 0–5)
Specific Gravity, Urine: 1.016 (ref 1.005–1.030)
WBC, UA: >50 WBC/hpf — ABNORMAL HIGH (ref 0–5)
pH: 6 (ref 5.0–8.0)

## 2022-08-31 MED ORDER — CEPHALEXIN 500 MG PO CAPS: 500.0000 mg | ORAL_CAPSULE | Freq: Two times a day (BID) | ORAL | 0 refills | Status: DC

## 2022-08-31 NOTE — ED Provider Notes (Addendum)
[name]ERGENC[facility] DEPT Provider Note   CSN: [id number] Arrival date & time: 08/31/22  8832     History  Chief Complaint  Patient presents with   Urinary Retention    Sean Lane is a 88 y.o. male.  Patient is a 88 year old male with a history of BPH, sick sinus syndrome/right bundle branch block, paroxysmal atrial fibrillation on Xarelto, coronary artery disease status post stent placement who presents with difficulty urinating.  He was recently seen in urgent care after he had some intermittent difficulty urinating and subsequently followed up with urology.  At that time he was able to get his urine out.  He was maintained on his BPH medications.  He was advised to start taking it at night versus midday.  During the night last night, he noted some increased difficulty urinating and has not been able to urinate since about 4 AM.  He has some increased pressure to his lower abdomen.  He was found on bladder scan on arrival to have about 500 cc of urine in his bladder.  Foley catheter was placed and he is currently feeling much better.  He denies any recent fevers.  No nausea or vomiting.  No burning on urination.  He has noted to have some swelling in his right leg as compared to his left.  He says normally he does not have much swelling in his legs and does not typically have increased swelling of his right leg as compared to his left.  He has some baseline shortness of breath which she says is not really different than normal although his significant other at bedside says that she has noted him to be more short of breath with exertion.  He denies any chest discomfort.  No cough or cold symptoms.       Home Medications Prior to Admission medications   Medication Sig Start Date End Date Taking? Authorizing Provider  cephALEXin (KEFLEX) 500 MG capsule Take 1 capsule (500 mg total) by mouth 2 (two) times daily. 08/31/22  Yes Malvin Johns, MD  Accu-Chek Softclix  Lancets lancets Check blood sugar once daily. E11.9 10/13/21   [name], MD  acetaminophen (TYLENOL) 650 MG CR tablet Take 1,300 mg by mouth every 8 (eight) hours as needed for pain.    [provider]  aspirin EC 81 MG tablet Take 81 mg by mouth daily.    [provider]  Blood Glucose Monitoring Suppl (ACCU-CHEK GUIDE ME) w/Device KIT Use to test blood sugars once daily. Dx: E11.9. Last meter supplied over 5 years ago- please give patient cash pay price if this is not covered or send [place] the script you need and we will sign. 10/13/21   [name], MD  cholecalciferol (VITAMIN D3) 25 MCG (1000 UNIT) tablet Take 2,000 Units by mouth daily.    [provider]  clobetasol (OLUX) 0.05 % topical foam Apply topically daily. 11/23/21   Lavonna Monarch, MD  CREAM BASE EX Apply 1 application topically at bedtime. Diabetics' Dry Skin Relief (applied to feet)    [provider]  DULoxetine (CYMBALTA) 20 MG capsule Take 1 capsule (20 mg total) by mouth daily. 08/22/22   [name], DO  dutasteride (AVODART) 0.5 MG capsule TAKE 1 CAPSULE DAILY 05/17/22   [name], MD  furosemide (LASIX) 20 MG tablet TAKE 1 TABLET BY MOUTH AS NEEDED 07/18/22   [name], MD  glucose blood (ACCU-CHEK GUIDE) test strip Check once daily.  E11.9 10/13/21   [name], MD  hydrALAZINE (APRESOLINE) 10 MG tablet Take 1 tablet (10 mg total) by mouth as needed (for SBP over 180). 08/09/22   [name], MD  lisinopril (ZESTRIL) 40 MG tablet Take 1 tablet (40 mg total) by mouth daily. 08/09/22   [name], MD  Naphazoline-Pheniramine (OPCON-A OP) Apply 1 drop to eye daily as needed (allergies).    [provider]  nitroGLYCERIN (NITROSTAT) 0.4 MG SL tablet Place 1 tablet (0.4 mg total) under the tongue every 5 (five) minutes as needed for chest pain (for chest pain). Use up to 3 dosages, if no relief call 911. 03/31/22   [name], MD  Omega-3 Fatty  Acids (FISH OIL) 1200 MG CAPS Take 2 capsules by mouth 2 (two) times daily.    [provider]  rosuvastatin (CRESTOR) 10 MG tablet TAKE 1 TABLET DAILY 02/20/22   [name], MD  tamsulosin (FLOMAX) 0.4 MG CAPS capsule TAKE 1 CAPSULE DAILY 06/06/22   [name], MD  XARELTO 20 MG TABS tablet TAKE 1 TABLET DAILY WITH   SUPPER 03/17/22   [name], MD      Allergies    Hydromorphone hcl, Ketoconazole, and Sulfonamide derivatives    Review of Systems   Review of Systems  Constitutional:  Negative for chills, diaphoresis, fatigue and fever.  HENT:  Negative for congestion, rhinorrhea and sneezing.   Eyes: Negative.   Respiratory:  Positive for shortness of breath. Negative for cough and chest tightness.   Cardiovascular:  Positive for leg swelling. Negative for chest pain.  Gastrointestinal:  Negative for abdominal pain, blood in stool, diarrhea, nausea and vomiting.  Genitourinary:  Positive for difficulty urinating. Negative for flank pain, frequency and hematuria.  Musculoskeletal:  Negative for arthralgias and back pain.  Skin:  Negative for rash.  Neurological:  Negative for dizziness, speech difficulty, weakness, numbness and headaches.    Physical Exam Updated Vital Signs BP (!) 162/74   Pulse 79   Temp 99.7 F (37.6 C) (Oral)   Resp (!) 22   Ht _0  (1.854 m)   Wt 78.9 kg   SpO2 95%   BMI 22.96 kg/m  Physical Exam Constitutional:      Appearance: He is well-developed.  HENT:     Head: Normocephalic and atraumatic.  Eyes:     Pupils: Pupils are equal, round, and reactive to light.  Cardiovascular:     Rate and Rhythm: Normal rate and regular rhythm.     Heart sounds: Normal heart sounds.  Pulmonary:     Effort: Pulmonary effort is normal. No respiratory distress.     Breath sounds: Normal breath sounds. No wheezing or rales.  Chest:     Chest wall: No tenderness.  Abdominal:     General: Bowel sounds are normal.     Palpations:  Abdomen is soft.     Tenderness: There is no abdominal tenderness. There is no guarding or rebound.  Musculoskeletal:        General: Normal range of motion.     Cervical back: Normal range of motion and neck supple.     Comments: 1+ pitting edema to the left lower extremity, 2+ pitting edema to the right lower extremity, both legs are warm and of normal coloration.  No calf tenderness.  Lymphadenopathy:     Cervical: No cervical adenopathy.  Skin:    General: Skin is warm and dry.  Findings: No rash.  Neurological:     Mental Status: He is alert and oriented to person, place, and time.     ED Results / Procedures / Treatments   Labs (all labs ordered are listed, but only abnormal results are displayed) Labs Reviewed  BASIC METABOLIC PANEL - Abnormal; Notable for the following components:      Result Value   Potassium 3.2 (*)    Glucose, Bld 150 (*)    All other components within normal limits  BRAIN NATRIURETIC PEPTIDE - Abnormal; Notable for the following components:   B Natriuretic Peptide 149.3 (*)    All other components within normal limits  CBC WITH DIFFERENTIAL/PLATELET - Abnormal; Notable for the following components:   Hemoglobin 11.5 (*)    HCT 36.2 (*)    RDW 18.3 (*)    Lymphs Abs 0.6 (*)    All other components within normal limits  URINALYSIS, ROUTINE W REFLEX MICROSCOPIC - Abnormal; Notable for the following components:   APPearance HAZY (*)    Hgb urine dipstick LARGE (*)    Ketones, ur TRACE (*)    Protein, ur 30 (*)    Leukocytes,Ua SMALL (*)    RBC / HPF >50 (*)    WBC, UA >50 (*)    Bacteria, UA RARE (*)    All other components within normal limits    EKG None  Radiology US Venous Img Lower Right (DVT Study)  Result Date: 08/31/2022 CLINICAL DATA:  Right lower extremity edema.  Evaluate for DVT. EXAM: RIGHT LOWER EXTREMITY VENOUS DOPPLER ULTRASOUND TECHNIQUE: Gray-scale sonography with graded compression, as well as color Doppler and duplex  ultrasound were performed to evaluate the lower extremity deep venous systems from the level of the common femoral vein and including the common femoral, femoral, profunda femoral, popliteal and calf veins including the posterior tibial, peroneal and gastrocnemius veins when visible. The superficial great saphenous vein was also interrogated. Spectral Doppler was utilized to evaluate flow at rest and with distal augmentation maneuvers in the common femoral, femoral and popliteal veins. COMPARISON:  None Available. FINDINGS: Contralateral Common Femoral Vein: Respiratory phasicity is normal and symmetric with the symptomatic side. No evidence of thrombus. Normal compressibility. Common Femoral Vein: No evidence of thrombus. Normal compressibility, respiratory phasicity and response to augmentation. Saphenofemoral Junction: No evidence of thrombus. Normal compressibility and flow on color Doppler imaging. Profunda Femoral Vein: No evidence of thrombus. Normal compressibility and flow on color Doppler imaging. Femoral Vein: No evidence of thrombus. Normal compressibility, respiratory phasicity and response to augmentation. Popliteal Vein: No evidence of thrombus. Normal compressibility, respiratory phasicity and response to augmentation. Calf Veins: No evidence of thrombus. Normal compressibility and flow on color Doppler imaging. Superficial Great Saphenous Vein: No evidence of thrombus. Normal compressibility. Other Findings: There is a minimal amount of subcutaneous edema at the level of the right lower leg and ankle. IMPRESSION: No evidence of DVT within the right lower extremity. Electronically Signed   By: Sandi Mariscal M.D.   On: 08/31/2022 10:41   DG Chest 2 View  Result Date: 08/31/2022 CLINICAL DATA:  Shortness of breath EXAM: CHEST - 2 VIEW COMPARISON:  CXR 02/23/21 FINDINGS: No pleural effusion. No pneumothorax. Unchanged cardiac and mediastinal contours. Low lung volumes with prominent interstitial  opacities bilaterally. No displaced rib fractures. Visualized upper abdomen is unremarkable. Vertebral body heights are maintained. IMPRESSION: No focal airspace opacity. Electronically Signed   By: Marin Roberts M.D.   On: 08/31/2022 10:07  Procedures Procedures    Medications Ordered in ED Medications - No data to display  ED Course/ Medical Decision Making/ A&P                           Medical Decision Making Amount and/or Complexity of Data Reviewed External Data Reviewed: labs, ECG and notes. Labs: ordered. Decision-making details documented in ED Course. Radiology: ordered and independent interpretation performed. Decision-making details documented in ED Course. ECG/medicine tests: ordered and independent interpretation performed. Decision-making details documented in ED Course.  Risk Prescription drug management. Decision regarding hospitalization.   Patient is an 88 year old male who presents with urinary retention.  Catheter was placed and he had a large amount of clear urine return.  It was sent for analysis and shows some suggestions of infection.  Will start on Keflex.  There was some question of some shortness of breath.  He does not feel like his breathing is really different compared to his baseline although his significant other feels like his breathing is a little worse.  His EKG is noted to be atrial fibrillation which is a known condition for him.  He is on anticoagulants.  His rate is controlled.  Per his significant other, he is followed by cardiology and has recently increased his medications.  He is known to be in A-fib recently by his cardiologist per his report.  Chest x-ray was performed to be which was interpreted by me and confirmed by the radiologist to show no acute disease.  No evidence of pneumonia.  No pulmonary edema.  His BNP is only mildly elevated.  No clinical symptoms of ACS.  He did have some unilateral increased swelling of his right leg.   Ultrasound was performed which shows no evidence of DVT.  He currently does not have any shortness of breath.  His oxygen saturation is normal.  No tachypnea or increased work of breathing.  No indication for hospitalization at this point.  He was discharged home in good condition.  He was given a prescription for Keflex.  He was encouraged to have close follow-up with [facility].  Will maintain his urinary catheter and this can be removed at the urologist office.  He was also encouraged to follow-up with his PCP regarding his shortness of breath.  Return precautions were given.  Final Clinical Impression(s) / ED Diagnoses Final diagnoses:  Urinary retention  Shortness of breath    Rx / DC Orders ED Discharge Orders          Ordered    cephALEXin (KEFLEX) 500 MG capsule  2 times daily        08/31/22 1133              Malvin Johns, MD 08/31/22 1137    Malvin Johns, MD 08/31/22 1138

## 2022-08-31 NOTE — ED Notes (Addendum)
Patient transported to us 

## 2022-08-31 NOTE — ED Notes (Signed)
Changed foley bag to leg bag. Discussed /demonstrated bag change with wife and husband. Discussed foley care with them as well and able to repeat back to nurse. Dc instructions reviewed with patient. Patient voiced understanding. Dc with belongings.

## 2022-08-31 NOTE — Discharge Instructions (Addendum)
Follow-up with your urologist as discussed.  Make an appointment to follow-up with your primary care doctor regarding your shortness of breath.  Return to the emergency room if you have any worsening symptoms.

## 2022-08-31 NOTE — ED Triage Notes (Signed)
Pt arrives to ED with c/o urinary retention.

## 2022-08-31 NOTE — ED Notes (Addendum)
16 french foley inserted sterile procedure with no resistance noted. Immediate return of clear yellow urine with few specks of blood. Pt felt immediate relief.2nd person Tobie Lords, EMT

## 2022-09-06 ENCOUNTER — Emergency Department (HOSPITAL_COMMUNITY)
Admission: EM | Admit: 2022-09-06 | Discharge: 2022-09-06 | Disposition: A | Discharge status: Home / Self Care | Payer: Medicare Other | Attending: Emergency Medicine | Admitting: Emergency Medicine

## 2022-09-06 ENCOUNTER — Other Ambulatory Visit: Payer: Self-pay

## 2022-09-06 DIAGNOSIS — R310 Gross hematuria: Secondary | ICD-10-CM | POA: Diagnosis not present

## 2022-09-06 DIAGNOSIS — Z7982 Long term (current) use of aspirin: Secondary | ICD-10-CM | POA: Diagnosis not present

## 2022-09-06 DIAGNOSIS — Z7901 Long term (current) use of anticoagulants: Secondary | ICD-10-CM | POA: Diagnosis not present

## 2022-09-06 LAB — URINALYSIS, ROUTINE W REFLEX MICROSCOPIC
Bacteria, UA: NONE SEEN
RBC / HPF: >50 RBC/hpf — ABNORMAL HIGH (ref 0–5)

## 2022-09-06 LAB — COMPREHENSIVE METABOLIC PANEL
ALT: 16 U/L (ref 0–44)
AST: 20 U/L (ref 15–41)
Albumin: 4.3 g/dL (ref 3.5–5.0)
Alkaline Phosphatase: 96 U/L (ref 38–126)
Anion gap: 10 (ref 5–15)
BUN: 18 mg/dL (ref 8–23)
CO2: 28 mmol/L (ref 22–32)
Calcium: 10 mg/dL (ref 8.9–10.3)
Chloride: 105 mmol/L (ref 98–111)
Creatinine, Ser: 0.84 mg/dL (ref 0.61–1.24)
GFR, Estimated: >60 mL/min (ref >60)
Glucose, Bld: 142 mg/dL — ABNORMAL HIGH (ref 70–99)
Potassium: 2.8 mmol/L — ABNORMAL LOW (ref 3.5–5.1)
Sodium: 143 mmol/L (ref 135–145)
Total Bilirubin: 0.7 mg/dL (ref 0.3–1.2)
Total Protein: 7.6 g/dL (ref 6.5–8.1)

## 2022-09-06 LAB — CBC WITH DIFFERENTIAL/PLATELET
Abs Immature Granulocytes: 0.01 10*3/uL (ref 0.00–0.07)
Basophils Absolute: 0 10*3/uL (ref 0.0–0.1)
Basophils Relative: 1 %
Eosinophils Absolute: 0.1 10*3/uL (ref 0.0–0.5)
Eosinophils Relative: 2 %
HCT: 39 % (ref 39.0–52.0)
Hemoglobin: 12.2 g/dL — ABNORMAL LOW (ref 13.0–17.0)
Immature Granulocytes: 0 %
Lymphocytes Relative: 23 %
Lymphs Abs: 1.1 10*3/uL (ref 0.7–4.0)
MCH: 26.4 pg (ref 26.0–34.0)
MCHC: 31.3 g/dL (ref 30.0–36.0)
MCV: 84.4 fL (ref 80.0–100.0)
Monocytes Absolute: 0.3 10*3/uL (ref 0.1–1.0)
Monocytes Relative: 7 %
Neutro Abs: 3 10*3/uL (ref 1.7–7.7)
Neutrophils Relative %: 67 %
Platelets: 215 10*3/uL (ref 150–400)
RBC: 4.62 MIL/uL (ref 4.22–5.81)
RDW: 18.2 % — ABNORMAL HIGH (ref 11.5–15.5)
WBC: 4.5 10*3/uL (ref 4.0–10.5)
nRBC: 0 % (ref 0.0–0.2)

## 2022-09-06 NOTE — ED Triage Notes (Signed)
Pt via POV c/o hematuria. He was unable to urinate last Thursday and went to [facility] where he received an indwelling catheter. They think the catheter tube is not long enough and is irritating his urethra with movement which is causing him to bleed. Pt woke this morning to find about 621m bright red output in drainage bag. Leg bag currently contains obviously bloody urine and he has continuous penile pain. He has more internal discomfort through lower urinary tract but some external pain as well.

## 2022-09-06 NOTE — Discharge Instructions (Signed)
Please call the urologist tomorrow to try and set up an appointment.  Please return if you are unable to urinate start developing worsening suprapubic pain or develop a fever or pain in your side.

## 2022-09-06 NOTE — ED Provider Triage Note (Signed)
Emergency Medicine Provider Triage Evaluation Note  Sean Lane , a 88 y.o. male  was evaluated in triage.  Pt complains of blood in foley bag since last night. Seen at [facility] on 12/21 due to urinary retention where a Foley was placed.  Patient states last night he noticed blood in his Foley bag.  He is currently on Xarelto.  Patient states he feels tugging in the penis due to short tubing. He is currently on keflex for UTI. NO fever.   Review of Systems  Positive: hematuria Negative: fever  Physical Exam  BP (!) 147/78 (BP Location: Left Arm)   Pulse 87   Temp 98.3 F (36.8 C) (Oral)   Resp 18   Ht '6\' 1"'$  (1.854 m)   Wt 79.4 kg   SpO2 93%   BMI 23.09 kg/m  Gen:   Awake, no distress   Resp:  Normal effort  MSK:   Moves extremities without difficulty  Other:  Blood in foley bag  Medical Decision Making  Medically screening exam initiated at 1:43 PM.  Appropriate orders placed.  PHENIX VANDERMEULEN was informed that the remainder of the evaluation will be completed by another provider, this initial triage assessment does not replace that evaluation, and the importance of remaining in the ED until their evaluation is complete.  Routine labs   Karie Kirks 09/06/22 1346

## 2022-09-06 NOTE — ED Provider Notes (Signed)
[facility] Provider Note   CSN: [id number] Arrival date & time: 09/06/22  1114     History  Chief Complaint  Patient presents with   Hematuria    Sean Lane is a 88 y.o. male.  88 yo M with a chief complaint of hematuria.  Patient had acute urinary retention and was seen in the ER setting and had a Foley catheter placed.  He tells me that he thinks the tube is too short because every time he bends his leg in a certain way it pulls at the attachment to his leg and causes pain at the catheter insertion site.  He had developed some hematuria couple days ago.  He denies any fevers flank pain.  Tells me he feels like he is draining his bladder without issue.   Hematuria       Home Medications Prior to Admission medications   Medication Sig Start Date End Date Taking? Authorizing Provider  Accu-Chek Softclix Lancets lancets Check blood sugar once daily. E11.9 10/13/21   [name], MD  acetaminophen (TYLENOL) 650 MG CR tablet Take 1,300 mg by mouth every 8 (eight) hours as needed for pain.    [provider]  aspirin EC 81 MG tablet Take 81 mg by mouth daily.    [provider]  Blood Glucose Monitoring Suppl (ACCU-CHEK GUIDE ME) w/Device KIT Use to test blood sugars once daily. Dx: E11.9. Last meter supplied over 5 years ago- please give patient cash pay price if this is not covered or send [place] the script you need and we will sign. 10/13/21   [name], MD  cephALEXin (KEFLEX) 500 MG capsule Take 1 capsule (500 mg total) by mouth 2 (two) times daily. 08/31/22   Malvin Johns, MD  cholecalciferol (VITAMIN D3) 25 MCG (1000 UNIT) tablet Take 2,000 Units by mouth daily.    [provider]  clobetasol (OLUX) 0.05 % topical foam Apply topically daily. 11/23/21   Lavonna Monarch, MD  CREAM BASE EX Apply 1 application topically at bedtime. Diabetics' Dry Skin Relief (applied to feet)    [provider]   DULoxetine (CYMBALTA) 20 MG capsule Take 1 capsule (20 mg total) by mouth daily. 08/22/22   [name], DO  dutasteride (AVODART) 0.5 MG capsule TAKE 1 CAPSULE DAILY 05/17/22   [name], MD  furosemide (LASIX) 20 MG tablet TAKE 1 TABLET BY MOUTH AS NEEDED 07/18/22   [name], MD  glucose blood (ACCU-CHEK GUIDE) test strip Check once daily. E11.9 10/13/21   [name], MD  hydrALAZINE (APRESOLINE) 10 MG tablet Take 1 tablet (10 mg total) by mouth as needed (for SBP over 180). 08/09/22   [name], MD  lisinopril (ZESTRIL) 40 MG tablet Take 1 tablet (40 mg total) by mouth daily. 08/09/22   [name], MD  Naphazoline-Pheniramine (OPCON-A OP) Apply 1 drop to eye daily as needed (allergies).    [provider]  nitroGLYCERIN (NITROSTAT) 0.4 MG SL tablet Place 1 tablet (0.4 mg total) under the tongue every 5 (five) minutes as needed for chest pain (for chest pain). Use up to 3 dosages, if no relief call 911. 03/31/22   [name], MD  Omega-3 Fatty Acids (FISH OIL) 1200 MG CAPS Take 2 capsules by mouth 2 (two) times daily.    [provider]  rosuvastatin (CRESTOR) 10 MG tablet TAKE 1 TABLET DAILY 02/20/22   [name], MD  tamsulosin (FLOMAX) 0.4 MG CAPS capsule TAKE 1 CAPSULE DAILY 06/06/22   [name], MD  XARELTO 20 MG TABS tablet TAKE 1 TABLET DAILY WITH   SUPPER 03/17/22   [name], MD      Allergies    Hydromorphone hcl, Ketoconazole, and Sulfonamide derivatives    Review of Systems   Review of Systems  Genitourinary:  Positive for hematuria.    Physical Exam Updated Vital Signs BP (!) 189/90   Pulse 98   Temp 98 F (36.7 C) (Oral)   Resp 18   Ht _0  (1.854 m)   Wt 79.4 kg   SpO2 100%   BMI 23.09 kg/m  Physical Exam Vitals and nursing note reviewed.  Constitutional:      Appearance: He is well-developed.  HENT:     Head: Normocephalic and atraumatic.  Eyes:     Pupils: Pupils are equal,  round, and reactive to light.  Neck:     Vascular: No JVD.  Cardiovascular:     Rate and Rhythm: Normal rate and regular rhythm.     Heart sounds: No murmur heard.    No friction rub. No gallop.  Pulmonary:     Effort: No respiratory distress.     Breath sounds: No wheezing.  Abdominal:     General: There is no distension.     Tenderness: There is no abdominal tenderness. There is no guarding or rebound.  Genitourinary:    Comments: Foley catheter in place.  Appears to be draining well.  Bloody urine. Musculoskeletal:        General: Normal range of motion.     Cervical back: Normal range of motion and neck supple.  Skin:    Coloration: Skin is not pale.     Findings: No rash.  Neurological:     Mental Status: He is alert and oriented to person, place, and time.  Psychiatric:        Behavior: Behavior normal.     ED Results / Procedures / Treatments   Labs (all labs ordered are listed, but only abnormal results are displayed) Labs Reviewed  CBC WITH DIFFERENTIAL/PLATELET - Abnormal; Notable for the following components:      Result Value   Hemoglobin 12.2 (*)    RDW 18.2 (*)    All other components within normal limits  COMPREHENSIVE METABOLIC PANEL - Abnormal; Notable for the following components:   Potassium 2.8 (*)    Glucose, Bld 142 (*)    All other components within normal limits  URINE CULTURE  URINALYSIS, ROUTINE W REFLEX MICROSCOPIC    EKG None  Radiology No results found.  Procedures Procedures    Medications Ordered in ED Medications - No data to display  ED Course/ Medical Decision Making/ A&P                           Medical Decision Making Amount and/or Complexity of Data Reviewed Labs: ordered.   88 yo M with a chief complaints of hematuria.  Could be traumatic due to the tugging at his Foley catheter site.  He unfortunately had it attached to his leg perhaps 2 distally.  This was removed and then replaced more proximally with some  improvement.  Bladder appears to be draining well.  Will send off urine studies for urology to review at his visit.  He is currently on antibiotics for possible urinary tract infection.  8:01 PM:  I  have discussed the diagnosis/risks/treatment options with the patient and family.  Evaluation and diagnostic testing in the emergency department does not suggest an emergent condition requiring admission or immediate intervention beyond what has been performed at this time.  They will follow up with PCP, urology. We also discussed returning to the ED immediately if new or worsening sx occur. We discussed the sx which are most concerning (e.g., sudden worsening pain, fever, inability to tolerate by mouth) that necessitate immediate return. Medications administered to the patient during their visit and any new prescriptions provided to the patient are listed below.  Medications given during this visit Medications - No data to display   The patient appears reasonably screen and/or stabilized for discharge and I doubt any other medical condition or other [facility] requiring further screening, evaluation, or treatment in the ED at this time prior to discharge.          Final Clinical Impression(s) / ED Diagnoses Final diagnoses:  Gross hematuria    Rx / DC Orders ED Discharge Orders     None         Deno Etienne, DO 09/06/22 2001

## 2022-09-07 ENCOUNTER — Encounter: Payer: Medicare Other | Admitting: Physical Therapy

## 2022-09-07 LAB — URINE CULTURE: Culture: NO GROWTH

## 2022-09-12 ENCOUNTER — Telehealth: Payer: Self-pay | Admitting: Family Medicine

## 2022-09-12 ENCOUNTER — Encounter: Payer: Medicare Other | Admitting: Physical Therapy

## 2022-09-12 NOTE — Telephone Encounter (Signed)
HOMECARE ADVISED    Patient Name: Sean Lane Gender: Male DOB: Aug 22, 1928 Age: 89 Y 2 M 16 D Return Phone Number: [phone] (Primary), [phone] (Secondary) Address: City/ State/ Zip:  [place]  [address] Client [facility] Client Site [facility] Night Provider [name]- MD Contact Type Call Who Is Calling Patient / Member / Family / Caregiver Call Type Triage / Clinical Caller Name [name] Hatton Relationship To Patient Spouse Return Phone Number [phone] (Primary) Chief Complaint Flu Symptom Reason for Call Symptomatic / Request for [place] states her husband in the last 12 days been in 2 ED's. for a urinary problem that caused him to bleed quite a bit. He may have contracted an illness from the sick pt in the ED. He has a cough with phlegm, he has a fever, and his body is sore. Translation No Nurse Assessment Nurse: Zenia Resides, RN, Diane Date/Time Eilene Ghazi Time): 09/10/2022 2:47:52 PM Confirm and document reason for call. If symptomatic, describe symptoms. ---Caller states pt. has a cough with phlegm, he has a fever (99.3), and his body is sore and fatigue. No difficulty breathing. On 12/21, we could not urinate. Went to ER and they placed a catheter and then the catheter bled and blood in the catheter. On 12/27 went to [facility] for the bloody catheter. Does the patient have any new or worsening symptoms? ---Yes Will a triage be completed? ---Yes Related visit to physician within the last 2 weeks? ---No Does the PT have any chronic conditions? (i.e. diabetes, asthma, this includes High risk factors for pregnancy, etc.) ---Yes List chronic conditions. ---DM, heart problems Is this a behavioral health or substance abuse call? ---No PLEASE NOTE: All timestamps contained within this report are represented as [place] Standard  Time. CONFIDENTIALTY NOTICE: This fax transmission is intended only for the addressee. It contains information that is legally privileged, confidential or otherwise protected from use or disclosure. If you are not the intended recipient, you are strictly prohibited from reviewing, disclosing, copying using or disseminating any of this information or taking any action in reliance on or regarding this information. If you have received this fax in error, please notify us immediately by telephone so that we can arrange for its return to [place]. Phone: [phone], Toll-Free: [phone], Fax: [phone] Page: 2 of 2 Call Id: [id number] Guidelines Guideline Title Affirmed Question Affirmed Notes Nurse Date/Time Eilene Ghazi Time) Cough - Acute Productive ALSO, mild central chest pain occurs only when coughing Zenia Resides, RN, Diane 09/10/2022 2:51:21 PM Disp. Time Eilene Ghazi Time) Disposition Final User 09/10/2022 2:12:42 PM Send to [place], RN, April 09/10/2022 2:56:23 [place], RN, Diane Final Disposition 09/10/2022 2:56:23 [place], RN, Diane Caller Disagree/Comply Comply Caller Understands Yes PreDisposition Did not know what to do Care Advice Given Per Guideline HOME CARE: * You should be able to treat this at home. REASSURANCE AND EDUCATION - COUGH: * It doesn't sound like a serious cough. * Coughing up mucus is very important for protecting the lungs from pneumonia. COUGH MEDICINES: * COUGH DROPS: Over-the-counter cough drops can help a lot, especially for mild coughs. They soothe an irritated throat and remove the tickle sensation in the back of the throat. Cough drops are easy to carry with you. * COUGH SYRUP WITH DEXTROMETHORPHAN: An over-the-counter cough syrup can help your cough. The most common cough suppressant in overthe-counter cough medicines is dextromethorphan. *  HOME REMEDY - HONEY: This old home remedy has been shown to help decrease  coughing at night. The adult dosage is 2 teaspoons (10 ml) at bedtime. * Examples: Delsym 12-hour Cough, Robitussin Cough Long-Acting, Triaminic Long-Acting, Vicks DayQuil Cough. COUGH SYRUP WITH DEXTROMETHORPHAN: * If the air is dry, use a humidifier in the bedroom. HUMIDIFIER: * Drink plenty of liquids. DRINK PLENTY OF LIQUIDS: CALL BACK IF: * Cough lasts over 3 weeks * Continuous coughing persists over 2 hours after cough treatment * Difficulty breathing occurs * Fever over 103 F (39.4 C) CARE ADVICE given per Cough - Acute Productive (Adult) guideline. EXPECTED COURSE: * A viral upper respiratory infection (such as a cold or flu) causes a cough for 1 to 3 weeks. Comments User: Hildred Priest, RN Date/Time Eilene Ghazi Time): 09/10/2022 2:56:21 PM Pt. did take Keflex for a UTI - still has a catheter. User: Hildred Priest, RN Date/Time Eilene Ghazi Time): 09/10/2022 2:58:38 PM Also has a large kidney stone. Bleeding has stopped

## 2022-09-13 ENCOUNTER — Telehealth: Payer: Self-pay | Admitting: Family Medicine

## 2022-09-13 ENCOUNTER — Encounter: Payer: Self-pay | Admitting: Family Medicine

## 2022-09-13 ENCOUNTER — Telehealth (INDEPENDENT_AMBULATORY_CARE_PROVIDER_SITE_OTHER): Payer: Medicare Other | Admitting: Family Medicine

## 2022-09-13 VITALS — Temp 100.0°F | Ht 73.0 in | Wt 163.0 lb

## 2022-09-13 DIAGNOSIS — E876 Hypokalemia: Secondary | ICD-10-CM | POA: Diagnosis not present

## 2022-09-13 DIAGNOSIS — J208 Acute bronchitis due to other specified organisms: Secondary | ICD-10-CM | POA: Diagnosis not present

## 2022-09-13 DIAGNOSIS — R339 Retention of urine, unspecified: Secondary | ICD-10-CM

## 2022-09-13 DIAGNOSIS — B9689 Other specified bacterial agents as the cause of diseases classified elsewhere: Secondary | ICD-10-CM | POA: Diagnosis not present

## 2022-09-13 MED ORDER — AZITHROMYCIN 250 MG PO TABS: ORAL_TABLET | ORAL | 0 refills | Status: DC

## 2022-09-13 MED ORDER — POTASSIUM CHLORIDE CRYS ER 20 MEQ PO TBCR: EXTENDED_RELEASE_TABLET | ORAL | 0 refills | Status: DC

## 2022-09-13 NOTE — Telephone Encounter (Signed)
Following video visit with Dr. Jonni Sanger on 09/13/22, Dr. Jonni Sanger requested pt schedule a lab only visit for either 01/08 or 01/09. This is for a potassium recheck. I LVM to get pt scheduled.

## 2022-09-13 NOTE — Progress Notes (Signed)
Virtual Visit via Video Note  Subjective  CC:  Chief Complaint  Patient presents with   Cough     I connected with Sean Lane on 09/13/22 at  3:00 PM EST by a video enabled telemedicine application and verified that I am speaking with the correct person using two identifiers. Location patient: Home Location provider:  [facility], Office Persons participating in the virtual visit: Sean George, MD Darlina Rumpf CMA  I discussed the limitations of evaluation and management by telemedicine and the availability of in person appointments. The patient expressed understanding and agreed to proceed. HPI: Sean Lane is a 89 y.o. male who was contacted today to address the problems listed above in the chief complaint. Reviewed 2 ED visits 12/21 and 12/27. Treated for acute urinary retention and gross hematuria. Neg urine culture. Completed 7 days of keflex over a week ago.  Now with URI sxs: harsh hacking productive cough. Multiple comorbidities. Chart reviewed. No sob. Fevers to 101. No cp. Some malaise. Eating and drinking ok. No gi sxs.  Potassium was 2.8 in ER: not addressed. On lasix prn. On ace.  Lab Results  Component Value Date   CREATININE 0.84 09/06/2022   BUN 18 09/06/2022   NA 143 09/06/2022   K 2.8 (L) 09/06/2022   CL 105 09/06/2022   CO2 28 09/06/2022    Assessment  1. Acute bacterial bronchitis   2. Hypokalemia   3. Urinary retention      Plan  Treat for bacterial bronchitis:  discussed it could be viral but given video visit, age and recent exposures, will treat with zpak and delsym. No respiratory distress on video. Negative home covid test today. Supportive care discussed. Red flags discussed Replete potassium with kdur 20 daily x 5 days, then recheck in office next week. Lab appt scheduled. Then rec prn with lasix.  Will f/u with urology; no more gross bleeding.   I discussed the assessment  and treatment plan with the patient. The patient was provided an opportunity to ask questions and all were answered. The patient agreed with the plan and demonstrated an understanding of the instructions.   The patient was advised to call back or seek an in-person evaluation if the symptoms worsen or if the condition fails to improve as anticipated. Follow up: Return if symptoms worsen or fail to improve.  11/09/2022  Meds ordered this encounter  Medications   azithromycin (ZITHROMAX) 250 MG tablet    Sig: Take 2 tabs today, then 1 tab daily for 4 days    Dispense:  1 each    Refill:  0   potassium chloride SA (KLOR-CON M) 20 MEQ tablet    Sig: Take 1 tablet daily for 5 days, then take 1 tablet with furosemide as needed    Dispense:  20 tablet    Refill:  0      I reviewed the patients updated PMH, FH, and SocHx.    Patient Active Problem List   Diagnosis Date Noted   Bilateral wrist pain 05/18/2021   Arthritis of carpometacarpal [facility]) joint of both thumbs 05/03/2021   Left rotator cuff tear 12/09/2020   Degenerative disc disease, lumbar 07/21/2020   Nonallopathic lesion of sacral region 12/17/2019   Nonallopathic lesion of lumbosacral region 12/17/2019   Spasm of left piriformis muscle 06/27/2019   Degenerative disc disease, cervical 05/28/2019   Nonallopathic lesion of thoracic region 05/28/2019  Thrombocytopenia ([place]) 12/04/2016   CAD S/P percutaneous coronary angioplasty 10/26/2016   Diverticulitis of colon 11/22/2015   Nephrolithiasis 05/07/2014   Gout attack 12/14/2012   Type 2 diabetes mellitus with CAD(HCC) 12/14/2012   Hematuria 03/28/2012   Atrial flutter ([place]) 10/03/2011   SINUS BRADYCARDIA 07/20/2010   BPH associated with nocturia 06/08/2009   RBBB 12/28/2008   GERD 12/15/2008   DIZZINESS 12/15/2008   CAD (coronary artery disease) 12/17/2007   LOSS, CONDUCTIVE HEARING, COMBINED TYPE 04/16/2007   Hyperlipidemia 04/02/2007   Essential hypertension 04/02/2007    Allergic rhinitis 04/02/2007   OA (osteoarthritis) of knee 04/02/2007   Current Meds  Medication Sig   Accu-Chek Softclix Lancets lancets Check blood sugar once daily. E11.9   acetaminophen (TYLENOL) 650 MG CR tablet Take 1,300 mg by mouth every 8 (eight) hours as needed for pain.   aspirin EC 81 MG tablet Take 81 mg by mouth daily.   azithromycin (ZITHROMAX) 250 MG tablet Take 2 tabs today, then 1 tab daily for 4 days   Blood Glucose Monitoring Suppl (ACCU-CHEK GUIDE ME) w/Device KIT Use to test blood sugars once daily. Dx: E11.9. Last meter supplied over 5 years ago- please give patient cash pay price if this is not covered or send [place] the script you need and we will sign.   cholecalciferol (VITAMIN D3) 25 MCG (1000 UNIT) tablet Take 2,000 Units by mouth daily.   clobetasol (OLUX) 0.05 % topical foam Apply topically daily.   CREAM BASE EX Apply 1 application topically at bedtime. Diabetics' Dry Skin Relief (applied to feet)   DULoxetine (CYMBALTA) 20 MG capsule Take 1 capsule (20 mg total) by mouth daily.   dutasteride (AVODART) 0.5 MG capsule TAKE 1 CAPSULE DAILY   furosemide (LASIX) 20 MG tablet TAKE 1 TABLET BY MOUTH AS NEEDED   glucose blood (ACCU-CHEK GUIDE) test strip Check once daily. E11.9   hydrALAZINE (APRESOLINE) 10 MG tablet Take 1 tablet (10 mg total) by mouth as needed (for SBP over 180).   lisinopril (ZESTRIL) 40 MG tablet Take 1 tablet (40 mg total) by mouth daily.   Naphazoline-Pheniramine (OPCON-A OP) Apply 1 drop to eye daily as needed (allergies).   nitroGLYCERIN (NITROSTAT) 0.4 MG SL tablet Place 1 tablet (0.4 mg total) under the tongue every 5 (five) minutes as needed for chest pain (for chest pain). Use up to 3 dosages, if no relief call 911.   Omega-3 Fatty Acids (FISH OIL) 1200 MG CAPS Take 2 capsules by mouth 2 (two) times daily.   potassium chloride SA (KLOR-CON M) 20 MEQ tablet Take 1 tablet daily for 5 days, then take 1 tablet with furosemide as needed    rosuvastatin (CRESTOR) 10 MG tablet TAKE 1 TABLET DAILY   tamsulosin (FLOMAX) 0.4 MG CAPS capsule TAKE 1 CAPSULE DAILY   XARELTO 20 MG TABS tablet TAKE 1 TABLET DAILY WITH   SUPPER    Allergies: Patient is allergic to hydromorphone hcl, ketoconazole, and sulfonamide derivatives. Family History: Patient family history includes Arthritis in his mother; Diabetes in his maternal grandfather and another family member; Hearing loss in his father and paternal grandfather; Heart disease in his father; Hypertension in his mother; Pleurisy in his father; Stroke in an other family member. Social History:  Patient  reports that he quit smoking about 61 years ago. His smoking use included cigarettes. He has been exposed to tobacco smoke. He has never used smokeless tobacco. He reports that he does not drink alcohol and does not  use drugs.  Review of Systems: Constitutional: + for fever malaise no anorexia Cardiovascular: negative for chest pain Respiratory: negative for SOB + persistent cough Gastrointestinal: negative for abdominal pain  OBJECTIVE Vitals: Temp 100 F (37.8 C)   Ht _0  (1.854 m)   Wt 163 lb (73.9 kg)   BMI 21.51 kg/m  General: no acute distress , A&Ox3, rare cough. No respiratory distress Speaking in clear sentences  Leamon Arnt, MD

## 2022-09-14 NOTE — Telephone Encounter (Signed)
Labs need to be ordered for this pt to be scheduled. Please send back to Admin pool once ordered.

## 2022-09-17 ENCOUNTER — Other Ambulatory Visit: Payer: Self-pay | Admitting: Family Medicine

## 2022-09-19 ENCOUNTER — Encounter: Payer: Self-pay | Admitting: Family Medicine

## 2022-09-19 DIAGNOSIS — E785 Hyperlipidemia, unspecified: Secondary | ICD-10-CM

## 2022-09-19 DIAGNOSIS — E1159 Type 2 diabetes mellitus with other circulatory complications: Secondary | ICD-10-CM

## 2022-09-20 NOTE — Telephone Encounter (Signed)
See below regarding scheduling. ?

## 2022-09-20 NOTE — Telephone Encounter (Signed)
Willing to work pt in?

## 2022-09-20 NOTE — Telephone Encounter (Signed)
Patient has been sch with Dr Louis Meckel for 1/23 at 830 am - patient aware- Please note Patient refused to see a NP on 1/15 at 145pm, prefers seeing an MD.

## 2022-09-20 NOTE — Telephone Encounter (Signed)
Pls advised when can Dr Yong Channel see patient -   [facility] to see if he can be seen soon with them.

## 2022-09-20 NOTE — Telephone Encounter (Signed)
FYI

## 2022-09-21 ENCOUNTER — Other Ambulatory Visit: Payer: Self-pay

## 2022-09-21 ENCOUNTER — Encounter: Payer: Self-pay | Admitting: Family Medicine

## 2022-09-21 ENCOUNTER — Emergency Department (HOSPITAL_BASED_OUTPATIENT_CLINIC_OR_DEPARTMENT_OTHER)
Admission: EM | Admit: 2022-09-21 | Discharge: 2022-09-21 | Disposition: A | Discharge status: Home / Self Care | Payer: Medicare Other | Attending: Emergency Medicine | Admitting: Emergency Medicine

## 2022-09-21 ENCOUNTER — Ambulatory Visit (INDEPENDENT_AMBULATORY_CARE_PROVIDER_SITE_OTHER): Payer: Medicare Other | Admitting: Family Medicine

## 2022-09-21 ENCOUNTER — Encounter (HOSPITAL_BASED_OUTPATIENT_CLINIC_OR_DEPARTMENT_OTHER): Payer: Self-pay | Admitting: Emergency Medicine

## 2022-09-21 ENCOUNTER — Other Ambulatory Visit (INDEPENDENT_AMBULATORY_CARE_PROVIDER_SITE_OTHER): Payer: Medicare Other

## 2022-09-21 VITALS — BP 122/64 | HR 88 | Temp 98.0°F | Ht 73.0 in | Wt 168.4 lb

## 2022-09-21 DIAGNOSIS — R140 Abdominal distension (gaseous): Secondary | ICD-10-CM | POA: Insufficient documentation

## 2022-09-21 DIAGNOSIS — R339 Retention of urine, unspecified: Secondary | ICD-10-CM | POA: Insufficient documentation

## 2022-09-21 DIAGNOSIS — R310 Gross hematuria: Secondary | ICD-10-CM | POA: Diagnosis not present

## 2022-09-21 DIAGNOSIS — R1030 Lower abdominal pain, unspecified: Secondary | ICD-10-CM | POA: Diagnosis not present

## 2022-09-21 DIAGNOSIS — I2510 Atherosclerotic heart disease of native coronary artery without angina pectoris: Secondary | ICD-10-CM | POA: Diagnosis not present

## 2022-09-21 DIAGNOSIS — E785 Hyperlipidemia, unspecified: Secondary | ICD-10-CM | POA: Diagnosis not present

## 2022-09-21 DIAGNOSIS — E1159 Type 2 diabetes mellitus with other circulatory complications: Secondary | ICD-10-CM

## 2022-09-21 DIAGNOSIS — I10 Essential (primary) hypertension: Secondary | ICD-10-CM | POA: Diagnosis not present

## 2022-09-21 DIAGNOSIS — R351 Nocturia: Secondary | ICD-10-CM | POA: Diagnosis not present

## 2022-09-21 DIAGNOSIS — E119 Type 2 diabetes mellitus without complications: Secondary | ICD-10-CM | POA: Insufficient documentation

## 2022-09-21 DIAGNOSIS — N401 Enlarged prostate with lower urinary tract symptoms: Secondary | ICD-10-CM

## 2022-09-21 LAB — URINALYSIS, ROUTINE W REFLEX MICROSCOPIC
Bilirubin Urine: NEGATIVE
Glucose, UA: NEGATIVE mg/dL
Ketones, ur: NEGATIVE mg/dL
Nitrite: POSITIVE — AB
Protein, ur: >300 mg/dL — AB
RBC / HPF: >50 RBC/hpf — ABNORMAL HIGH (ref 0–5)
Specific Gravity, Urine: 1.017 (ref 1.005–1.030)
WBC, UA: >50 WBC/hpf — ABNORMAL HIGH (ref 0–5)
pH: 8.5 — ABNORMAL HIGH (ref 5.0–8.0)

## 2022-09-21 LAB — COMPREHENSIVE METABOLIC PANEL
ALT: 9 U/L (ref 0–53)
AST: 13 U/L (ref 0–37)
Albumin: 3.8 g/dL (ref 3.5–5.2)
Alkaline Phosphatase: 104 U/L (ref 39–117)
BUN: 16 mg/dL (ref 6–23)
CO2: 30 mEq/L (ref 19–32)
Calcium: 9.7 mg/dL (ref 8.4–10.5)
Chloride: 101 mEq/L (ref 96–112)
Creatinine, Ser: 1.01 mg/dL (ref 0.40–1.50)
GFR: 64.68 mL/min (ref >60.00)
Glucose, Bld: 253 mg/dL — ABNORMAL HIGH (ref 70–99)
Potassium: 3.6 mEq/L (ref 3.5–5.1)
Sodium: 142 mEq/L (ref 135–145)
Total Bilirubin: 0.6 mg/dL (ref 0.2–1.2)
Total Protein: 6.2 g/dL (ref 6.0–8.3)

## 2022-09-21 LAB — CBC WITH DIFFERENTIAL/PLATELET
Basophils Absolute: 0.1 10*3/uL (ref 0.0–0.1)
Basophils Relative: 1 % (ref 0.0–3.0)
Eosinophils Absolute: 0.1 10*3/uL (ref 0.0–0.7)
Eosinophils Relative: 1.3 % (ref 0.0–5.0)
HCT: 36.4 % — ABNORMAL LOW (ref 39.0–52.0)
Hemoglobin: 11.9 g/dL — ABNORMAL LOW (ref 13.0–17.0)
Lymphocytes Relative: 14.4 % (ref 12.0–46.0)
Lymphs Abs: 0.7 10*3/uL (ref 0.7–4.0)
MCHC: 32.6 g/dL (ref 30.0–36.0)
MCV: 81.4 fl (ref 78.0–100.0)
Monocytes Absolute: 0.3 10*3/uL (ref 0.1–1.0)
Monocytes Relative: 6.1 % (ref 3.0–12.0)
Neutro Abs: 4 10*3/uL (ref 1.4–7.7)
Neutrophils Relative %: 77.2 % — ABNORMAL HIGH (ref 43.0–77.0)
Platelets: 269 10*3/uL (ref 150.0–400.0)
RBC: 4.47 Mil/uL (ref 4.22–5.81)
RDW: 18.6 % — ABNORMAL HIGH (ref 11.5–15.5)
WBC: 5.2 10*3/uL (ref 4.0–10.5)

## 2022-09-21 LAB — TSH: TSH: 1.43 u[IU]/mL (ref 0.35–5.50)

## 2022-09-21 LAB — HEMOGLOBIN A1C: Hgb A1c MFr Bld: 6.8 % — ABNORMAL HIGH (ref 4.6–6.5)

## 2022-09-21 NOTE — ED Triage Notes (Signed)
  Patient comes in with urinary retention that he has noticed for the last 12 hours.  Patient states he was seen here on 12/21 and urinary catheter was placed due to retention.  Has been waiting for urology appointment but noticed no urine output in the last 8 hours.  Patient had hematuria related to kidney stone and was concerned for obstruction.  Pain 4/10, pressure in lower abdomen.

## 2022-09-21 NOTE — Patient Instructions (Addendum)
Lets extend xarelto hold until Monday- does increase stroke risk some but we have got to get these bleeding situation under control and that would give [place] more space in case they do an intervention  We will reach out once bloodwork available  Recommended follow up: Return for next already scheduled visit or sooner if needed.
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
## 2022-09-21 NOTE — ED Notes (Signed)
Drainage bag changed to leg bag, urometer sent with pt and wife, pt verbalized understanding of catheter care, cleansing and how to change bags.

## 2022-09-21 NOTE — ED Notes (Signed)
  Urinary catheter irrigated with 300 ml of NS.  Patient tolerated well.  Dr Laverta Baltimore notified.

## 2022-09-21 NOTE — Progress Notes (Signed)
Phone [phone] In person visit   Subjective:   Sean Lane is a 89 y.o. year old very pleasant male patient who presents for/with See problem oriented charting Chief Complaint  Patient presents with   Follow-up    Pt is here to f/u on ER visit from today and to discuss mychart message   Past Medical History-  Patient Active Problem List   Diagnosis Date Noted   CAD S/P percutaneous coronary angioplasty 10/26/2016    Priority: High   Type 2 diabetes mellitus with CAD(HCC) 12/14/2012    Priority: High   Atrial flutter ([place]) 10/03/2011    Priority: High   CAD (coronary artery disease) 12/17/2007    Priority: High   SINUS BRADYCARDIA 07/20/2010    Priority: Medium    BPH associated with nocturia 06/08/2009    Priority: Medium    DIZZINESS 12/15/2008    Priority: Medium    Hyperlipidemia 04/02/2007    Priority: Medium    Essential hypertension 04/02/2007    Priority: Medium    Thrombocytopenia ([place]) 12/04/2016    Priority: Low   Diverticulitis of colon 11/22/2015    Priority: Low   Nephrolithiasis 05/07/2014    Priority: Low   Gout attack 12/14/2012    Priority: Low   Hematuria 03/28/2012    Priority: Low   RBBB 12/28/2008    Priority: Low   GERD 12/15/2008    Priority: Low   LOSS, CONDUCTIVE HEARING, COMBINED TYPE 04/16/2007    Priority: Low   Allergic rhinitis 04/02/2007    Priority: Low   OA (osteoarthritis) of knee 04/02/2007    Priority: Low   Bilateral wrist pain 05/18/2021    Priority: 1.   Arthritis of carpometacarpal [facility]) joint of both thumbs 05/03/2021    Priority: 1.   Left rotator cuff tear 12/09/2020    Priority: 1.   Degenerative disc disease, lumbar 07/21/2020    Priority: 1.   Nonallopathic lesion of sacral region 12/17/2019    Priority: 1.   Nonallopathic lesion of lumbosacral region 12/17/2019    Priority: 1.   Spasm of left piriformis muscle 06/27/2019    Priority: 1.   Degenerative disc disease, cervical 05/28/2019     Priority: 1.   Nonallopathic lesion of thoracic region 05/28/2019    Priority: 1.    Medications- reviewed and updated Current Outpatient Medications  Medication Sig Dispense Refill   Accu-Chek Softclix Lancets lancets Check blood sugar once daily. E11.9 90 each 3   acetaminophen (TYLENOL) 650 MG CR tablet Take 1,300 mg by mouth every 8 (eight) hours as needed for pain.     aspirin EC 81 MG tablet Take 81 mg by mouth daily.     Blood Glucose Monitoring Suppl (ACCU-CHEK GUIDE ME) w/Device KIT Use to test blood sugars once daily. Dx: E11.9. Last meter supplied over 5 years ago- please give patient cash pay price if this is not covered or send [place] the script you need and we will sign. 1 kit 0   cholecalciferol (VITAMIN D3) 25 MCG (1000 UNIT) tablet Take 2,000 Units by mouth daily.     clobetasol (OLUX) 0.05 % topical foam Apply topically daily. 50 g 3   CREAM BASE EX Apply 1 application topically at bedtime. Diabetics' Dry Skin Relief (applied to feet)     DULoxetine (CYMBALTA) 20 MG capsule TAKE 1 CAPSULE BY MOUTH EVERY DAY 90 capsule 1   dutasteride (AVODART) 0.5 MG capsule TAKE 1 CAPSULE DAILY 90 capsule 3  furosemide (LASIX) 20 MG tablet TAKE 1 TABLET BY MOUTH AS NEEDED 90 tablet 3   glucose blood (ACCU-CHEK GUIDE) test strip Check once daily. E11.9 90 strip 3   hydrALAZINE (APRESOLINE) 10 MG tablet Take 1 tablet (10 mg total) by mouth as needed (for SBP over 180). 30 tablet 3   lisinopril (ZESTRIL) 40 MG tablet Take 1 tablet (40 mg total) by mouth daily. 90 tablet 3   Naphazoline-Pheniramine (OPCON-A OP) Apply 1 drop to eye daily as needed (allergies).     nitroGLYCERIN (NITROSTAT) 0.4 MG SL tablet Place 1 tablet (0.4 mg total) under the tongue every 5 (five) minutes as needed for chest pain (for chest pain). Use up to 3 dosages, if no relief call 911. 75 tablet 1   Omega-3 Fatty Acids (FISH OIL) 1200 MG CAPS Take 2 capsules by mouth 2 (two) times daily.     potassium chloride SA (KLOR-CON  M) 20 MEQ tablet Take 1 tablet daily for 5 days, then take 1 tablet with furosemide as needed 20 tablet 0   rosuvastatin (CRESTOR) 10 MG tablet TAKE 1 TABLET DAILY 90 tablet 3   tamsulosin (FLOMAX) 0.4 MG CAPS capsule TAKE 1 CAPSULE DAILY 90 capsule 1   XARELTO 20 MG TABS tablet TAKE 1 TABLET DAILY WITH   SUPPER (Patient not taking: Reported on 09/21/2022) 90 tablet 3   No current facility-administered medications for this visit.     Objective:  BP 122/64   Pulse 88   Temp 98 F (36.7 C)   Ht '6\' 1"'$  (1.854 m)   Wt 168 lb 6.4 oz (76.4 kg)   SpO2 97%   BMI 22.22 kg/m  Gen: NAD but does appear fatigued CV: irregularly irregular  Lungs: CTAB no crackles, wheeze, rhonchi Ext: Trace to 1+ edema Skin: warm, dry     Assessment and Plan   # ED follow-up-hematuria/fatigue/frequent medical intervention S: Patient reached out by MyChart on 09/19/2022 in the evening recounting recent issues particularly related to urinary retention and hematuria related to urinary retention and need for catheter-he reported feeling quite ill, depressed, not sleeping, not eating well and he was uncertain what next steps to take.  By the time I received a message my day of seeing patients had ended-we planned to do labs today and then see him today (but see below had emergency department visit earlier today).  Had most recently been in the emergency department on 09/06/2022 due to constant hematuria and was recommended to have follow-up with urology which was able to be scheduled within the next few weeks.  Unfortunate emergency department he was exposed to various elements and 2 days later developed flulike symptoms and had an appointment with Dr. Jonni Sanger on January 3 virtually and diagnosed with bronchitis and was given treatment for bacterial bronchitis given overall risk with age and recent exposures with azithromycin and Delsym I recommended supportive care.  COVID test at home have been negative.  History of low  potassium so potassium was given and recommended repeat  Patient was seen this morning by Dr. Laverta Baltimore in the emergency department with mild lower abdominal discomfort and no urine outflow for at least 12 hours without hematuria-they attempted to flush Foley catheter leg bag but this did not restore flow.  They opted to replace the Foley catheter-but noted large clot and gross hematuria.  Bladder was hand irrigated through new Foley and no additional clots were found with new urine only being pink-tinged and flow much improved.  He  was to call urology for close follow-up -Catheterized urine was obtained.  Urinalysis with protein, blood, nitrates, leukocytes and microscopic with RBCs, WBCs, bacteria.  No new medications were given  At this appointment: Patient reports to me that he continues to feel very fatigued.  Urine is flowing but can tell that his blood bmet-reddish discoloration.  He stopped Xarelto as of this morning and he was told to hold for 3 days-I believe that was by the emergency room physician.  He is scheduled to see urology on Monday.  Patient reports he has had a kidney stone since 1983-he is worried the blood could be related to this. - In regard to respiratory illness he has been dealing with some lingering cough.  Still having some nasal congestion but much better overall.  This has been tough on him as the last time he apparently had a respiratory illness was 2002. -He has noted some increased edema but has not taken Lasix yet  A/P: 89 year old male with recurrent episodes of gross hematuria and clots that seem to be associated with urinary retention.  He does have elevated stroke risk with atrial flutter history but we opted to hold his Xarelto at least until his urology visit-would like for him to update me with plan from that day if possible.  Originally plan had been to hold only 3 days but if he restarts medicine before visit he needs a procedure I am concerned could cause recurrent  bleeding.  He may need cystoscopy.  Symptoms could be caused by BPH or recurrent instrumentation.  Also pending UTI evaluation -To be honest patient had some falls last year and already considered in 2023 stopping Xarelto (we will continue aspirin with CAD for now) -Most important piece of this puzzle is keeping urology follow-up on Monday-sounds like visit is with Lina Sayre and will see Dr. Louis Meckel couple weeks later  He also is recovering from respiratory illness but seems to be improving-continue to monitor  Finally does have some increased edema but has Lasix available as needed-he is going to take this for a few days  #hypertension S: medication: Lisinopril 40 mg, hydralazine 10 mg as needed if blood pressure over 180 BP Readings from Last 3 Encounters:  09/21/22 122/64  09/21/22 (!) 160/81  09/06/22 (!) 189/90  A/P: Controlled. Continue current medications.  # Atrial flutter S: Rate controlled with no medication Anticoagulated with Xarelto until today A/P: Appropriately rate controlled on medication.  Short-term we are going to hold his Gabriel Rainwater understands increased stroke risk   # Diabetes S: Medication: None/diet controlled Lab Results  Component Value Date   HGBA1C 6.8 (H) 09/21/2022   HGBA1C 6.6 (H) 05/11/2022   HGBA1C 6.3 10/13/2021   A/P: Well-controlled-continue current medication   Recommended follow up: Return for next already scheduled visit or sooner if needed. Future Appointments  Date Time Provider [place]  11/09/2022  2:00 PM [name], MD [facility]  11/29/2022  4:00 PM [name], DPM [facility]  02/15/2023  8:45 AM [name], MD [facility]  05/04/2023  1:30 PM [facility]    Lab/Order associations:   ICD-10-CM   1. Gross hematuria  R31.0     2. BPH associated with nocturia  N40.1    R35.1     3. Type 2 diabetes mellitus with other circulatory complication, without  long-term current use of insulin (HCC)  E11.59     4. Essential hypertension  I10  No orders of the defined types were placed in this encounter.   Return precautions advised.  [name], MD

## 2022-09-21 NOTE — Discharge Instructions (Signed)
You were seen in the emergency room today with blood in the urine causing a blockage.  We replaced your Foley catheter and have irrigated your bladder.  I would have you hold your Xarelto for the next 3 days and call the urologist for a close follow-up.  If you have additional issues with urine not flowing into your Foley bag, please return to the emergency department or Urology office for evaluation.

## 2022-09-23 LAB — URINE CULTURE: Culture: >=100000 — AB

## 2022-09-24 ENCOUNTER — Telehealth (HOSPITAL_BASED_OUTPATIENT_CLINIC_OR_DEPARTMENT_OTHER): Payer: Self-pay | Admitting: *Deleted

## 2022-09-24 NOTE — Telephone Encounter (Signed)
Post ED Visit - Positive Culture Follow-up  Culture report reviewed by antimicrobial stewardship pharmacist: [place] Team '[]'$  Elenor Quinones, Pharm.D. '[]'$  Heide Guile, Pharm.D., BCPS AQ-ID '[]'$  Parks Neptune, Pharm.D., BCPS '[]'$  Alycia Rossetti, Pharm.D., BCPS '[]'$  Hemet, Pharm.D., BCPS, AAHIVP '[]'$  Legrand Como, Pharm.D., BCPS, AAHIVP '[]'$  Salome Arnt, PharmD, BCPS '[]'$  Johnnette Gourd, PharmD, BCPS '[]'$  Hughes Better, PharmD, BCPS '[]'$  Leeroy Cha, PharmD '[]'$  Laqueta Linden, PharmD, BCPS '[x]'$  Bertis Ruddy, PharmD  [place] Team '[]'$  Leodis Sias, PharmD '[]'$  Lindell Spar, PharmD '[]'$  Royetta Asal, PharmD '[]'$  Graylin Shiver, Rph '[]'$  Rema Fendt) Glennon Mac, PharmD '[]'$  Arlyn Dunning, PharmD '[]'$  Netta Cedars, PharmD '[]'$  Dia Sitter, PharmD '[]'$  Leone Haven, PharmD '[]'$  Gretta Arab, PharmD '[]'$  Theodis Shove, PharmD '[]'$  Peggyann Juba, PharmD '[]'$  Reuel Boom, PharmD   Positive urine culture Do not treat per Astrid Drafts, PA-C  [name] 09/24/2022, 10:22 AM

## 2022-09-25 DIAGNOSIS — N3000 Acute cystitis without hematuria: Secondary | ICD-10-CM | POA: Diagnosis not present

## 2022-09-25 DIAGNOSIS — R338 Other retention of urine: Secondary | ICD-10-CM | POA: Diagnosis not present

## 2022-09-25 NOTE — Telephone Encounter (Signed)
Please schedule lab visit

## 2022-09-26 ENCOUNTER — Ambulatory Visit: Payer: Medicare Other | Admitting: Family Medicine

## 2022-09-26 ENCOUNTER — Encounter: Payer: Self-pay | Admitting: Family Medicine

## 2022-09-30 ENCOUNTER — Other Ambulatory Visit: Payer: Self-pay

## 2022-09-30 ENCOUNTER — Emergency Department (HOSPITAL_BASED_OUTPATIENT_CLINIC_OR_DEPARTMENT_OTHER)
Admission: EM | Admit: 2022-09-30 | Discharge: 2022-09-30 | Disposition: A | Discharge status: Home / Self Care | Payer: Medicare Other | Attending: Emergency Medicine | Admitting: Emergency Medicine

## 2022-09-30 ENCOUNTER — Encounter (HOSPITAL_BASED_OUTPATIENT_CLINIC_OR_DEPARTMENT_OTHER): Payer: Self-pay | Admitting: Emergency Medicine

## 2022-09-30 DIAGNOSIS — Z7901 Long term (current) use of anticoagulants: Secondary | ICD-10-CM | POA: Insufficient documentation

## 2022-09-30 DIAGNOSIS — Z96653 Presence of artificial knee joint, bilateral: Secondary | ICD-10-CM | POA: Insufficient documentation

## 2022-09-30 DIAGNOSIS — Z87891 Personal history of nicotine dependence: Secondary | ICD-10-CM | POA: Diagnosis not present

## 2022-09-30 DIAGNOSIS — T83091A Other mechanical complication of indwelling urethral catheter, initial encounter: Secondary | ICD-10-CM

## 2022-09-30 DIAGNOSIS — I2510 Atherosclerotic heart disease of native coronary artery without angina pectoris: Secondary | ICD-10-CM | POA: Insufficient documentation

## 2022-09-30 DIAGNOSIS — I10 Essential (primary) hypertension: Secondary | ICD-10-CM | POA: Diagnosis not present

## 2022-09-30 DIAGNOSIS — Z7982 Long term (current) use of aspirin: Secondary | ICD-10-CM | POA: Diagnosis not present

## 2022-09-30 DIAGNOSIS — Y69 Unspecified misadventure during surgical and medical care: Secondary | ICD-10-CM | POA: Insufficient documentation

## 2022-09-30 DIAGNOSIS — Z79899 Other long term (current) drug therapy: Secondary | ICD-10-CM | POA: Diagnosis not present

## 2022-09-30 DIAGNOSIS — E119 Type 2 diabetes mellitus without complications: Secondary | ICD-10-CM | POA: Diagnosis not present

## 2022-09-30 LAB — URINALYSIS, ROUTINE W REFLEX MICROSCOPIC
Bilirubin Urine: NEGATIVE
Glucose, UA: NEGATIVE mg/dL
Ketones, ur: NEGATIVE mg/dL
Nitrite: NEGATIVE
Protein, ur: 30 mg/dL — AB
RBC / HPF: >50 RBC/hpf — ABNORMAL HIGH (ref 0–5)
Specific Gravity, Urine: 1.02 (ref 1.005–1.030)
WBC, UA: >50 WBC/hpf — ABNORMAL HIGH (ref 0–5)
pH: 6 (ref 5.0–8.0)

## 2022-09-30 NOTE — ED Notes (Signed)
Call placed to lab to add urine culture to previous collection
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
## 2022-09-30 NOTE — ED Notes (Addendum)
Replaced foley bag w/leg bag per pt request.  Pt cleaned per pt's family request.  Discharge instructions discussed w/pt by previous RN.

## 2022-09-30 NOTE — ED Triage Notes (Signed)
Pt reports having a foley catheter placed 09/21/2022, pt reports foley is not draining freely, reports if he pushes on his bladder it will drain, pt c/o bladder pain

## 2022-10-01 LAB — URINE CULTURE: Culture: <10000 — AB

## 2022-10-03 ENCOUNTER — Telehealth: Payer: Self-pay

## 2022-10-03 DIAGNOSIS — R338 Other retention of urine: Secondary | ICD-10-CM | POA: Diagnosis not present

## 2022-10-03 NOTE — Telephone Encounter (Signed)
Please disregard- opened in error

## 2022-10-04 ENCOUNTER — Telehealth: Payer: Self-pay

## 2022-10-04 DIAGNOSIS — R338 Other retention of urine: Secondary | ICD-10-CM | POA: Diagnosis not present

## 2022-10-04 NOTE — Telephone Encounter (Signed)
        Patient  visited [place] on 1/20   Telephone encounter attempt : 1st   A HIPAA compliant voice message was left requesting a return call.  Instructed patient to call back   [place], [facility]  [phone] [address], [place], [place] [address] Phone: [phone] Email: [email or web address]

## 2022-10-05 ENCOUNTER — Telehealth: Payer: Self-pay

## 2022-10-05 DIAGNOSIS — N139 Obstructive and reflux uropathy, unspecified: Secondary | ICD-10-CM | POA: Diagnosis not present

## 2022-10-05 DIAGNOSIS — R338 Other retention of urine: Secondary | ICD-10-CM | POA: Diagnosis not present

## 2022-10-05 DIAGNOSIS — R310 Gross hematuria: Secondary | ICD-10-CM | POA: Diagnosis not present

## 2022-10-05 DIAGNOSIS — N401 Enlarged prostate with lower urinary tract symptoms: Secondary | ICD-10-CM | POA: Diagnosis not present

## 2022-10-05 DIAGNOSIS — R311 Benign essential microscopic hematuria: Secondary | ICD-10-CM | POA: Diagnosis not present

## 2022-10-05 NOTE — Telephone Encounter (Signed)
     Patient  visit on 1/20  at [place]   Have you been able to follow up with your primary care physician? Yes this morning  The patient was or was not able to obtain any needed medicine or equipment. NA, Patient was rushing off the phone to get to the appointment and didn't answer the question    Are there diet recommendations that you are having difficulty following? NA   Patient expresses understanding of discharge instructions and education provided has no other needs at this time.  Yes      [place], [facility], Care Management  [phone] [address], [place], [place] [address] Phone: [phone] Email: [email or web address]

## 2022-10-09 ENCOUNTER — Encounter: Payer: Self-pay | Admitting: Family Medicine

## 2022-10-09 ENCOUNTER — Other Ambulatory Visit: Payer: Self-pay

## 2022-10-09 MED ORDER — DULOXETINE HCL 20 MG PO CPEP: 20.0000 mg | ORAL_CAPSULE | Freq: Every day | ORAL | 1 refills | Status: DC

## 2022-10-10 ENCOUNTER — Encounter: Payer: Self-pay | Admitting: Family Medicine

## 2022-10-11 ENCOUNTER — Other Ambulatory Visit: Payer: Self-pay

## 2022-10-11 DIAGNOSIS — R310 Gross hematuria: Secondary | ICD-10-CM | POA: Diagnosis not present

## 2022-10-11 DIAGNOSIS — R338 Other retention of urine: Secondary | ICD-10-CM | POA: Diagnosis not present

## 2022-10-11 DIAGNOSIS — N139 Obstructive and reflux uropathy, unspecified: Secondary | ICD-10-CM | POA: Diagnosis not present

## 2022-10-11 DIAGNOSIS — N401 Enlarged prostate with lower urinary tract symptoms: Secondary | ICD-10-CM | POA: Diagnosis not present

## 2022-10-11 MED ORDER — FUROSEMIDE 20 MG PO TABS: 20.0000 mg | ORAL_TABLET | ORAL | 3 refills | Status: DC | PRN

## 2022-11-09 ENCOUNTER — Other Ambulatory Visit: Payer: Self-pay | Admitting: Family Medicine

## 2022-11-09 ENCOUNTER — Encounter: Payer: Self-pay | Admitting: Family Medicine

## 2022-11-09 ENCOUNTER — Ambulatory Visit (INDEPENDENT_AMBULATORY_CARE_PROVIDER_SITE_OTHER): Payer: Medicare Other | Admitting: Family Medicine

## 2022-11-09 VITALS — BP 120/60 | HR 64 | Temp 97.9°F | Ht 73.0 in | Wt 169.8 lb

## 2022-11-09 DIAGNOSIS — R413 Other amnesia: Secondary | ICD-10-CM | POA: Diagnosis not present

## 2022-11-09 DIAGNOSIS — N401 Enlarged prostate with lower urinary tract symptoms: Secondary | ICD-10-CM | POA: Diagnosis not present

## 2022-11-09 DIAGNOSIS — E1151 Type 2 diabetes mellitus with diabetic peripheral angiopathy without gangrene: Secondary | ICD-10-CM | POA: Diagnosis not present

## 2022-11-09 DIAGNOSIS — E785 Hyperlipidemia, unspecified: Secondary | ICD-10-CM

## 2022-11-09 DIAGNOSIS — E1159 Type 2 diabetes mellitus with other circulatory complications: Secondary | ICD-10-CM | POA: Diagnosis not present

## 2022-11-09 DIAGNOSIS — R351 Nocturia: Secondary | ICD-10-CM

## 2022-11-09 DIAGNOSIS — I2510 Atherosclerotic heart disease of native coronary artery without angina pectoris: Secondary | ICD-10-CM | POA: Diagnosis not present

## 2022-11-09 DIAGNOSIS — I10 Essential (primary) hypertension: Secondary | ICD-10-CM

## 2022-11-09 DIAGNOSIS — Z0110 Encounter for examination of ears and hearing without abnormal findings: Secondary | ICD-10-CM

## 2022-11-09 DIAGNOSIS — I4892 Unspecified atrial flutter: Secondary | ICD-10-CM

## 2022-11-09 MED ORDER — ACCU-CHEK GUIDE VI STRP: ORAL_STRIP | 3 refills | Status: DC

## 2022-11-09 MED ORDER — ACCU-CHEK SOFTCLIX LANCETS MISC: 3 refills | Status: DC

## 2022-11-09 NOTE — Assessment & Plan Note (Signed)
#   Memory concerns S:may forget things from the day before like what show they watched on [employer]. No trouble driving. No trouble with finances other than some cpu software. Wife does help with meds -low energy recently but has had a lot of medical illness, coughs a lot even without outdoor exposure- years of issues but worse recently. Was planning to schedule follow up with [facility] Redmond Baseman [facility].  A/P: Mild MMSE reduction 28/30- we opted to monitor fo rnow- if worsens consider further workup   -wants audiology referral for hearing loss- can have associations with memory -discussed 6-12 months repeat for MMSE

## 2022-11-09 NOTE — Patient Instructions (Addendum)
Let me know if any worsening memory issues- glad you are improving overall  Recommended follow up: Return in about 6 months (around 05/10/2023) for followup or sooner if needed.Schedule b4 you leave.

## 2022-11-09 NOTE — Progress Notes (Signed)
Phone [phone] In person visit   Subjective:   Sean Lane is a 89 y.o. year old very pleasant male patient who presents for/with See problem oriented charting Chief Complaint  Patient presents with   Follow-up   Hyperlipidemia   Diabetes   Hypertension   short term memory    Pt having short term memory issues and wants to know if something can be taken OTC.    Past Medical History-  Patient Active Problem List   Diagnosis Date Noted   Memory change 11/09/2022    Priority: High   CAD S/P percutaneous coronary angioplasty 10/26/2016    Priority: High   Type 2 diabetes mellitus with CAD(HCC) 12/14/2012    Priority: High   Atrial flutter ([place]) 10/03/2011    Priority: High   CAD (coronary artery disease) 12/17/2007    Priority: High   SINUS BRADYCARDIA 07/20/2010    Priority: Medium    BPH associated with nocturia 06/08/2009    Priority: Medium    DIZZINESS 12/15/2008    Priority: Medium    Hyperlipidemia 04/02/2007    Priority: Medium    Essential hypertension 04/02/2007    Priority: Medium    Thrombocytopenia ([place]) 12/04/2016    Priority: Low   Diverticulitis of colon 11/22/2015    Priority: Low   Nephrolithiasis 05/07/2014    Priority: Low   Gout attack 12/14/2012    Priority: Low   Hematuria 03/28/2012    Priority: Low   RBBB 12/28/2008    Priority: Low   GERD 12/15/2008    Priority: Low   LOSS, CONDUCTIVE HEARING, COMBINED TYPE 04/16/2007    Priority: Low   Allergic rhinitis 04/02/2007    Priority: Low   OA (osteoarthritis) of knee 04/02/2007    Priority: Low   Bilateral wrist pain 05/18/2021    Priority: 1.   Arthritis of carpometacarpal [facility]) joint of both thumbs 05/03/2021    Priority: 1.   Left rotator cuff tear 12/09/2020    Priority: 1.   Degenerative disc disease, lumbar 07/21/2020    Priority: 1.   Nonallopathic lesion of sacral region 12/17/2019    Priority: 1.   Nonallopathic lesion of lumbosacral region 12/17/2019     Priority: 1.   Spasm of left piriformis muscle 06/27/2019    Priority: 1.   Degenerative disc disease, cervical 05/28/2019    Priority: 1.   Nonallopathic lesion of thoracic region 05/28/2019    Priority: 1.    Medications- reviewed and updated Current Outpatient Medications  Medication Sig Dispense Refill   Accu-Chek Softclix Lancets lancets Check blood sugar once daily. E11.9 90 each 3   acetaminophen (TYLENOL) 650 MG CR tablet Take 1,300 mg by mouth every 8 (eight) hours as needed for pain.     aspirin EC 81 MG tablet Take 81 mg by mouth daily.     Blood Glucose Monitoring Suppl (ACCU-CHEK GUIDE ME) w/Device KIT Use to test blood sugars once daily. Dx: E11.9. Last meter supplied over 5 years ago- please give patient cash pay price if this is not covered or send [place] the script you need and we will sign. 1 kit 0   cholecalciferol (VITAMIN D3) 25 MCG (1000 UNIT) tablet Take 2,000 Units by mouth daily.     clobetasol (OLUX) 0.05 % topical foam Apply topically daily. 50 g 3   CREAM BASE EX Apply 1 application topically at bedtime. Diabetics' Dry Skin Relief (applied to feet)     DULoxetine (CYMBALTA) 20 MG capsule  Take 1 capsule (20 mg total) by mouth daily. 90 capsule 1   dutasteride (AVODART) 0.5 MG capsule TAKE 1 CAPSULE DAILY 90 capsule 3   furosemide (LASIX) 20 MG tablet Take 1 tablet (20 mg total) by mouth as needed. 90 tablet 3   glucose blood (ACCU-CHEK GUIDE) test strip Check once daily. E11.9 90 strip 3   hydrALAZINE (APRESOLINE) 10 MG tablet Take 1 tablet (10 mg total) by mouth as needed (for SBP over 180). 30 tablet 3   lisinopril (ZESTRIL) 40 MG tablet Take 1 tablet (40 mg total) by mouth daily. 90 tablet 3   Naphazoline-Pheniramine (OPCON-A OP) Apply 1 drop to eye daily as needed (allergies).     nitroGLYCERIN (NITROSTAT) 0.4 MG SL tablet Place 1 tablet (0.4 mg total) under the tongue every 5 (five) minutes as needed for chest pain (for chest pain). Use up to 3 dosages, if no  relief call 911. 75 tablet 1   Omega-3 Fatty Acids (FISH OIL) 1200 MG CAPS Take 2 capsules by mouth 2 (two) times daily.     potassium chloride SA (KLOR-CON M) 20 MEQ tablet Take 1 tablet daily for 5 days, then take 1 tablet with furosemide as needed 20 tablet 0   rosuvastatin (CRESTOR) 10 MG tablet TAKE 1 TABLET DAILY 90 tablet 3   tamsulosin (FLOMAX) 0.4 MG CAPS capsule TAKE 1 CAPSULE DAILY 90 capsule 1   XARELTO 20 MG TABS tablet TAKE 1 TABLET DAILY WITH   SUPPER 90 tablet 3   No current facility-administered medications for this visit.     Objective:  BP 120/60   Pulse 64   Temp 97.9 F (36.6 C)   Ht '6\' 1"'$  (1.854 m)   Wt 169 lb 12.8 oz (77 kg)   SpO2 96%   BMI 22.40 kg/m  Gen: NAD, resting comfortably CV: RRR no murmurs rubs or gallops Lungs: CTAB no crackles, wheeze, rhonchi Ext: trace edema bilaterally Skin: warm, dry Neuro: hard of hearing    Assessment and Plan   # Memory concerns S:may forget things from the day before like what show they watched on [employer]. No trouble driving. No trouble with finances other than some cpu software. Wife does help with meds. Gradual memory changes and worse in last 2-3 months but has had more medical interventions lately -low energy recently but has had a lot of medical illness, coughs a lot even without outdoor exposure- years of issues but worse recently. Was planning to schedule follow up with Dr. Redmond Baseman [facility].  A/P: Mild MMSE reduction 28/30- we opted to monitor for now- if worsens consider further workup  . Discussed mild cognitive impairment but no clear dementia  -wants audiology referral for hearing loss- can have associations with memory -discussed 6-12 months repeat for MMSE  # Bladder obstruction with BPH-patient required ED visit for changing out Foley catheter on September 30, 2022 in January 2024.  He remains on dutasteride and tamsulosin-reports since that time was taken out off of indwelling catheter and self  caths if needed- but hasn't needed to- bladder is flowing.  Enjoying being off the catheter- life feels more normal.   #CAD #hyperlipidemia S: Medication:not on asa as on xarelto 20 mg, rosuvastatin 10 mg daily No chest pain or shortness of breath  (other than with garbage can at times) Lab Results  Component Value Date   CHOL 112 05/11/2022   HDL 36.60 (L) 05/11/2022   LDLCALC 45 05/11/2022   LDLDIRECT 54.0 06/05/2017  TRIG 149.0 05/11/2022   CHOLHDL 3 05/11/2022   A/P: CAD asymptomatic - continue current medications Lipids at goal- continue current medications   # Diabetes S: Medication: none  Lab Results  Component Value Date   HGBA1C 6.8 (H) 09/21/2022   HGBA1C 6.6 (H) 05/11/2022   HGBA1C 6.3 10/13/2021   A/P: diet controlled- continue to monitor  #hypertension S: medication: Lisinopril 40 mg, hydralazine 10 mg if blood pressure gets over 180, Lasix 20 mg daily as needed- needing lasix for swelling lately  BP Readings from Last 3 Encounters:  11/09/22 120/60  09/30/22 (!) 150/76  09/21/22 122/64  A/P: stable- continue current medicines    # Atrial bladder S: Rate controlled with no medication Anticoagulated with Xarelto 20 mg A/P: appropriately anticoagulated and rate controlled without medicine- continue current medicine  Recommended follow up: Return in about 6 months (around 05/10/2023) for followup or sooner if needed.Schedule b4 you leave. Future Appointments  Date Time Provider [place]  11/21/2022  3:00 PM [name], DO [facility] None  11/29/2022  4:00 PM [name], DPM [facility]  02/15/2023  8:45 AM [name], MD [facility]  05/04/2023  1:30 PM [facility]  05/11/2023  2:20 PM [name], MD [facility]   Immunization History  Administered Date(s) Administered   COVID-19, mRNA, vaccine(Comirnaty)12 years and older 07/15/2022   Fluad Quad(high Dose 65+) 05/24/2019, 05/12/2022    Influenza Split 05/28/2012   Influenza Whole 06/08/2009   Influenza, High Dose Seasonal PF 06/27/2013, 06/06/2016, 06/05/2017, 06/07/2018, 05/31/2020, 05/12/2022   Influenza,inj,Quad PF,6+ Mos 05/07/2014, 05/24/2015   Influenza-Unspecified 05/31/2020, 05/27/2021   PFIZER(Purple Top)SARS-COV-2 Vaccination 10/04/2019, 10/25/2019, 06/15/2020   Pneumococcal Conjugate-13 11/03/2013, 05/07/2014   Pneumococcal Polysaccharide-23 05/24/2015   Pneumococcal-Unspecified 06/20/1982   Td 09/11/2002   Tdap 10/25/2011, 12/04/2021    Lab/Order associations:   ICD-10-CM   1. Encounter for hearing examination, unspecified whether abnormal findings  Z01.10 Ambulatory referral to Audiology    2. Atrial flutter, unspecified type ([place])  I48.92     3. Coronary artery disease involving native coronary artery of native heart without angina pectoris  I25.10     4. Type 2 diabetes mellitus with other circulatory complication, without long-term current use of insulin (HCC)  E11.59     5. BPH associated with nocturia  N40.1    R35.1     6. Essential hypertension  I10     7. Hyperlipidemia, unspecified hyperlipidemia type  E78.5     8. Type II diabetes mellitus with peripheral circulatory disorder (HCC) Chronic E11.51     9. Memory change  R41.3       Meds ordered this encounter  Medications   glucose blood (ACCU-CHEK GUIDE) test strip    Sig: Check once daily. E11.9    Dispense:  90 strip    Refill:  3   Accu-Chek Softclix Lancets lancets    Sig: Check blood sugar once daily. E11.9    Dispense:  90 each    Refill:  3    Return precautions advised.  [name], MD

## 2022-11-10 ENCOUNTER — Encounter: Payer: Self-pay | Admitting: Family Medicine

## 2022-11-10 ENCOUNTER — Other Ambulatory Visit: Payer: Self-pay | Admitting: Family Medicine

## 2022-11-14 DIAGNOSIS — R351 Nocturia: Secondary | ICD-10-CM | POA: Diagnosis not present

## 2022-11-14 DIAGNOSIS — N401 Enlarged prostate with lower urinary tract symptoms: Secondary | ICD-10-CM | POA: Diagnosis not present
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
## 2022-11-21 ENCOUNTER — Ambulatory Visit (INDEPENDENT_AMBULATORY_CARE_PROVIDER_SITE_OTHER): Payer: Medicare Other | Admitting: Family Medicine

## 2022-11-21 VITALS — BP 128/78 | HR 68 | Ht 73.0 in | Wt 170.0 lb

## 2022-11-21 DIAGNOSIS — M9902 Segmental and somatic dysfunction of thoracic region: Secondary | ICD-10-CM | POA: Diagnosis not present

## 2022-11-21 DIAGNOSIS — M9908 Segmental and somatic dysfunction of rib cage: Secondary | ICD-10-CM | POA: Diagnosis not present

## 2022-11-21 DIAGNOSIS — M5136 Other intervertebral disc degeneration, lumbar region: Secondary | ICD-10-CM

## 2022-11-21 DIAGNOSIS — M9903 Segmental and somatic dysfunction of lumbar region: Secondary | ICD-10-CM | POA: Diagnosis not present

## 2022-11-21 DIAGNOSIS — M9904 Segmental and somatic dysfunction of sacral region: Secondary | ICD-10-CM | POA: Diagnosis not present

## 2022-11-21 DIAGNOSIS — M9901 Segmental and somatic dysfunction of cervical region: Secondary | ICD-10-CM | POA: Diagnosis not present

## 2022-11-21 NOTE — Patient Instructions (Signed)
Glad you all are better See me in 6 weeks

## 2022-11-21 NOTE — Assessment & Plan Note (Signed)
Degenerative disc disease with some increasing tightness secondary to patient not being able to be as active with patient's recent illnesses and sitting.  Discussed the range of motion exercises.  Patient is having some deconditioning we did discuss the possibility of physical therapy but at the moment we will hold.  Will see how patient responds, discussed icing regimen and home exercises otherwise.  Follow-up again in 6 to 8 weeks

## 2022-11-27 ENCOUNTER — Ambulatory Visit: Payer: Medicare Other | Admitting: Dermatology

## 2022-11-29 ENCOUNTER — Ambulatory Visit (INDEPENDENT_AMBULATORY_CARE_PROVIDER_SITE_OTHER): Payer: Medicare Other | Admitting: Podiatry

## 2022-11-29 DIAGNOSIS — E1151 Type 2 diabetes mellitus with diabetic peripheral angiopathy without gangrene: Secondary | ICD-10-CM

## 2022-11-29 DIAGNOSIS — M79675 Pain in left toe(s): Secondary | ICD-10-CM

## 2022-11-29 DIAGNOSIS — M79674 Pain in right toe(s): Secondary | ICD-10-CM

## 2022-11-29 DIAGNOSIS — B351 Tinea unguium: Secondary | ICD-10-CM

## 2022-11-30 ENCOUNTER — Encounter: Payer: Self-pay | Admitting: Podiatry

## 2022-11-30 NOTE — Progress Notes (Signed)
This patient returns to my office for at risk foot care.  This patient requires this care by a professional since this patient will be at risk due to having type 2 diabetes and coagulation defect.  Patient is taking xarelto.  This patient presents to the office with his wife.  This patient is unable to cut nails himself since the patient cannot reach his nails.These nails are painful walking and wearing shoes.  This patient presents for at risk foot care today.  General Appearance  Alert, conversant and in no acute stress.  Vascular  Dorsalis pedis   pulses are  weakly palpable  bilaterally. Posterior tibial pulses are absent  Bilaterally. Capillary return is within normal limits  bilaterally. Temperature is within normal limits  bilaterally.  Neurologic  Senn-Weinstein monofilament wire test diminished   bilaterally. Muscle power within normal limits bilaterally.  Nails Thick disfigured discolored nails with subungual debris  from hallux to fifth toes bilaterally. Hammer toes 2-4  B/L.  Orthopedic  No limitations of motion  feet .  No crepitus or effusions noted.  No bony pathology or digital deformities noted.  Skin  normotropic skin with no porokeratosis noted bilaterally.  No signs of infections or ulcers noted.     Onychomycosis  Pain in right toes  Pain in left toes  Consent was obtained for treatment procedures.   Mechanical debridement of nails 1-5  bilaterally performed with a nail nipper.  Filed with dremel without incident.    Return office visit   10 weeks                 Told patient to return for periodic foot care and evaluation due to potential at risk complications.   Ambriel Gorelick DPM  

## 2022-12-01 DIAGNOSIS — Z0110 Encounter for examination of ears and hearing without abnormal findings: Secondary | ICD-10-CM | POA: Diagnosis not present

## 2022-12-01 DIAGNOSIS — H903 Sensorineural hearing loss, bilateral: Secondary | ICD-10-CM | POA: Diagnosis not present
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
## 2023-01-04 ENCOUNTER — Ambulatory Visit (INDEPENDENT_AMBULATORY_CARE_PROVIDER_SITE_OTHER): Payer: Medicare Other | Admitting: Family Medicine

## 2023-01-04 VITALS — BP 140/68 | HR 80 | Ht 73.0 in | Wt 173.0 lb

## 2023-01-04 DIAGNOSIS — M9902 Segmental and somatic dysfunction of thoracic region: Secondary | ICD-10-CM

## 2023-01-04 DIAGNOSIS — M542 Cervicalgia: Secondary | ICD-10-CM | POA: Diagnosis not present

## 2023-01-04 DIAGNOSIS — M9904 Segmental and somatic dysfunction of sacral region: Secondary | ICD-10-CM | POA: Diagnosis not present

## 2023-01-04 DIAGNOSIS — M9901 Segmental and somatic dysfunction of cervical region: Secondary | ICD-10-CM | POA: Diagnosis not present

## 2023-01-04 DIAGNOSIS — M9903 Segmental and somatic dysfunction of lumbar region: Secondary | ICD-10-CM

## 2023-01-04 DIAGNOSIS — M5136 Other intervertebral disc degeneration, lumbar region: Secondary | ICD-10-CM | POA: Diagnosis not present

## 2023-01-04 DIAGNOSIS — M9908 Segmental and somatic dysfunction of rib cage: Secondary | ICD-10-CM | POA: Diagnosis not present

## 2023-01-04 MED ORDER — METHYLPREDNISOLONE ACETATE 40 MG/ML IJ SUSP
40.0000 mg | Freq: Once | INTRAMUSCULAR | Status: AC
  Administered 2023-01-04: 40 mg via INTRAMUSCULAR

## 2023-01-04 NOTE — Patient Instructions (Addendum)
40 depo injection today See you again in 6 weeks

## 2023-01-05 NOTE — Assessment & Plan Note (Signed)
Degenerative disc disease of the lumbar spine.  Patient does have limitation of the sidebending bilaterally.  Patient does have some decrease in flexion as well.  Due to patient's age and other comorbidities somewhat difficult to treat.  Patient does have related social connection with his wife.  Has had social determinant of health also includes high risk for depression and is physically and active.  Some of this is secondary to his arthritis.  Continue on the Cymbalta.  Discussed insulin regimen and home exercises.  Does seem to respond relatively well though to manipulation.  Follow-up with me again in 6 weeks

## 2023-01-08 ENCOUNTER — Encounter: Payer: Self-pay | Admitting: Family Medicine

## 2023-01-23 DIAGNOSIS — N200 Calculus of kidney: Secondary | ICD-10-CM | POA: Diagnosis not present

## 2023-01-23 DIAGNOSIS — R310 Gross hematuria: Secondary | ICD-10-CM | POA: Diagnosis not present

## 2023-01-30 DIAGNOSIS — N3000 Acute cystitis without hematuria: Secondary | ICD-10-CM | POA: Diagnosis not present

## 2023-01-30 DIAGNOSIS — R310 Gross hematuria: Secondary | ICD-10-CM | POA: Diagnosis not present

## 2023-01-31 ENCOUNTER — Encounter (HOSPITAL_COMMUNITY): Payer: Self-pay

## 2023-01-31 ENCOUNTER — Other Ambulatory Visit: Payer: Self-pay

## 2023-01-31 ENCOUNTER — Emergency Department (HOSPITAL_COMMUNITY)
Admission: EM | Admit: 2023-01-31 | Discharge: 2023-01-31 | Disposition: A | Discharge status: Home / Self Care | Payer: Medicare Other | Attending: Emergency Medicine | Admitting: Emergency Medicine

## 2023-01-31 DIAGNOSIS — N3001 Acute cystitis with hematuria: Secondary | ICD-10-CM | POA: Insufficient documentation

## 2023-01-31 DIAGNOSIS — R339 Retention of urine, unspecified: Secondary | ICD-10-CM | POA: Diagnosis not present

## 2023-01-31 DIAGNOSIS — Z7901 Long term (current) use of anticoagulants: Secondary | ICD-10-CM | POA: Insufficient documentation

## 2023-01-31 DIAGNOSIS — Z7982 Long term (current) use of aspirin: Secondary | ICD-10-CM | POA: Diagnosis not present

## 2023-01-31 LAB — URINALYSIS, W/ REFLEX TO CULTURE (INFECTION SUSPECTED)
Bilirubin Urine: NEGATIVE
Glucose, UA: NEGATIVE mg/dL
Ketones, ur: 5 mg/dL — AB
Nitrite: NEGATIVE
Protein, ur: NEGATIVE mg/dL
RBC / HPF: >50 RBC/hpf (ref 0–5)
Specific Gravity, Urine: 1.009 (ref 1.005–1.030)
WBC, UA: >50 WBC/hpf (ref 0–5)
pH: 6 (ref 5.0–8.0)

## 2023-01-31 LAB — CBC WITH DIFFERENTIAL/PLATELET
Abs Immature Granulocytes: 0.01 10*3/uL (ref 0.00–0.07)
Basophils Absolute: 0.1 10*3/uL (ref 0.0–0.1)
Basophils Relative: 1 %
Eosinophils Absolute: 0 10*3/uL (ref 0.0–0.5)
Eosinophils Relative: 1 %
HCT: 35.6 % — ABNORMAL LOW (ref 39.0–52.0)
Hemoglobin: 11.3 g/dL — ABNORMAL LOW (ref 13.0–17.0)
Immature Granulocytes: 0 %
Lymphocytes Relative: 13 %
Lymphs Abs: 0.6 10*3/uL — ABNORMAL LOW (ref 0.7–4.0)
MCH: 28.4 pg (ref 26.0–34.0)
MCHC: 31.7 g/dL (ref 30.0–36.0)
MCV: 89.4 fL (ref 80.0–100.0)
Monocytes Absolute: 0.4 10*3/uL (ref 0.1–1.0)
Monocytes Relative: 8 %
Neutro Abs: 3.9 10*3/uL (ref 1.7–7.7)
Neutrophils Relative %: 77 %
Platelets: 148 10*3/uL — ABNORMAL LOW (ref 150–400)
RBC: 3.98 MIL/uL — ABNORMAL LOW (ref 4.22–5.81)
RDW: 16.7 % — ABNORMAL HIGH (ref 11.5–15.5)
WBC: 5.1 10*3/uL (ref 4.0–10.5)
nRBC: 0 % (ref 0.0–0.2)

## 2023-01-31 LAB — BASIC METABOLIC PANEL
Anion gap: 9 (ref 5–15)
BUN: 16 mg/dL (ref 8–23)
CO2: 23 mmol/L (ref 22–32)
Calcium: 9.4 mg/dL (ref 8.9–10.3)
Chloride: 105 mmol/L (ref 98–111)
Creatinine, Ser: 0.82 mg/dL (ref 0.61–1.24)
GFR, Estimated: >60 mL/min (ref >60)
Glucose, Bld: 138 mg/dL — ABNORMAL HIGH (ref 70–99)
Potassium: 3.2 mmol/L — ABNORMAL LOW (ref 3.5–5.1)
Sodium: 137 mmol/L (ref 135–145)

## 2023-01-31 MED ORDER — CEPHALEXIN 500 MG PO CAPS: 500.0000 mg | ORAL_CAPSULE | Freq: Three times a day (TID) | ORAL | 0 refills | Status: AC

## 2023-01-31 NOTE — ED Notes (Signed)
Changed pt to catheter leg bag

## 2023-01-31 NOTE — ED Notes (Signed)
Pt states that he is in pain due to urine retention. Pt has hx of this problem and has recently has a catheter removed. Pt states he handles pain well most of the time but this is almost too much.

## 2023-01-31 NOTE — Discharge Instructions (Addendum)
Continue on the antibiotics from Urology, I have added Keflex for a UTI  Call urology to let them know you were seen  Return for new or worsening symptoms

## 2023-01-31 NOTE — ED Notes (Signed)
Pt states that all pain has subsided with insertion of catheter.

## 2023-01-31 NOTE — ED Provider Notes (Signed)
[place] EMERGENCY DEPARTMENT AT [facility] Provider Note   CSN: [id number] Arrival date & time: 01/31/23  0725     History  Chief Complaint  Patient presents with   Hematuria    Sean Lane is a 89 y.o. male history of recurrent urinary retention followed by Dr. Marlou Porch with [facility] here for evaluation of difficulty urinating.  Was seen by Dr. Marlou Porch yesterday diagnosed UTI, prescribed doxycycline.  Took his first dose yesterday.  Noted when he woke up this morning he was unable to void.  He tried performing self-catheterization x 2 without any return of urine.  Last time he did get a small amount of blood.  He was told that urology yesterday to stop his anticoagulation.  Missed 2 days worth.  Has been suprapubic fullness and sensation of needing to void without being able to.  No fever, chills, nausea, vomiting, chest pain, shortness of breath, back pain, pain with bowel movements.  No easy bruising.  No recent injury or trauma.  Feels consistent with his prior episodes where he required insertion of a Foley catheter.  HPI     Home Medications Prior to Admission medications   Medication Sig Start Date End Date Taking? Authorizing Provider  cephALEXin (KEFLEX) 500 MG capsule Take 1 capsule (500 mg total) by mouth 3 (three) times daily for 7 days. 01/31/23 02/07/23 Yes Fred Hammes A, PA-C  Accu-Chek Softclix Lancets lancets USE TO CHECK BLOOD SUGAR ONCE DAILY. Dx: E11.9 11/09/22   Shelva Majestic, MD  acetaminophen (TYLENOL) 650 MG CR tablet Take 1,300 mg by mouth every 8 (eight) hours as needed for pain.    [provider]  aspirin EC 81 MG tablet Take 81 mg by mouth daily.    [provider]  Blood Glucose Monitoring Suppl (ACCU-CHEK GUIDE ME) w/Device KIT Use to test blood sugars once daily. Dx: E11.9. Last meter supplied over 5 years ago- please give patient cash pay price if this is not covered or send [place] the script you need and we  will sign. 10/13/21   Shelva Majestic, MD  cholecalciferol (VITAMIN D3) 25 MCG (1000 UNIT) tablet Take 2,000 Units by mouth daily.    [provider]  clobetasol (OLUX) 0.05 % topical foam Apply topically daily. 11/23/21   Janalyn Harder, MD  CREAM BASE EX Apply 1 application topically at bedtime. Diabetics' Dry Skin Relief (applied to feet)    [provider]  DULoxetine (CYMBALTA) 20 MG capsule Take 1 capsule (20 mg total) by mouth daily. 10/09/22   Judi Saa, DO  dutasteride (AVODART) 0.5 MG capsule TAKE 1 CAPSULE DAILY 05/17/22   Shelva Majestic, MD  furosemide (LASIX) 20 MG tablet Take 1 tablet (20 mg total) by mouth as needed. 10/11/22   Shelva Majestic, MD  glucose blood (ACCU-CHEK GUIDE) test strip USE TO TEST ONCE DAILY. Dx: E11.9 11/09/22   Shelva Majestic, MD  hydrALAZINE (APRESOLINE) 10 MG tablet Take 1 tablet (10 mg total) by mouth as needed (for SBP over 180). 08/09/22   Wendall Stade, MD  lisinopril (ZESTRIL) 40 MG tablet Take 1 tablet (40 mg total) by mouth daily. 08/09/22   Wendall Stade, MD  Naphazoline-Pheniramine (OPCON-A OP) Apply 1 drop to eye daily as needed (allergies).    [provider]  nitroGLYCERIN (NITROSTAT) 0.4 MG SL tablet Place 1 tablet (0.4 mg total) under the tongue every 5 (five) minutes as needed for chest pain (for  chest pain). Use up to 3 dosages, if no relief call 911. 03/31/22   Shelva Majestic, MD  Omega-3 Fatty Acids (FISH OIL) 1200 MG CAPS Take 2 capsules by mouth 2 (two) times daily.    [provider]  potassium chloride SA (KLOR-CON M) 20 MEQ tablet Take 1 tablet daily for 5 days, then take 1 tablet with furosemide as needed 09/13/22   Willow Ora, MD  rosuvastatin (CRESTOR) 10 MG tablet TAKE 1 TABLET DAILY 02/20/22   Shelva Majestic, MD  tamsulosin (FLOMAX) 0.4 MG CAPS capsule TAKE 1 CAPSULE DAILY 11/10/22   Shelva Majestic, MD  XARELTO 20 MG TABS tablet TAKE 1 TABLET DAILY WITH   SUPPER 03/17/22    Shelva Majestic, MD      Allergies    Hydromorphone hcl, Hydromorphone hcl, Ketoconazole, and Sulfonamide derivatives    Review of Systems   Review of Systems  Constitutional: Negative.   HENT: Negative.    Respiratory: Negative.    Cardiovascular: Negative.   Gastrointestinal: Negative.   Genitourinary:  Positive for decreased urine volume, difficulty urinating and hematuria. Negative for enuresis, flank pain, frequency, genital sores, penile discharge, penile pain, penile swelling, scrotal swelling, testicular pain and urgency.  Musculoskeletal: Negative.   Skin: Negative.   Neurological: Negative.   All other systems reviewed and are negative.   Physical Exam Updated Vital Signs BP 135/68   Pulse 76   Temp 97.9 F (36.6 C) (Oral)   Resp 18   Wt 78 kg   SpO2 93%   BMI 22.69 kg/m  Physical Exam Vitals and nursing note reviewed.  Constitutional:      General: He is not in acute distress.    Appearance: He is well-developed. He is not ill-appearing, toxic-appearing or diaphoretic.  HENT:     Head: Normocephalic and atraumatic.     Mouth/Throat:     Mouth: Mucous membranes are moist.  Eyes:     Pupils: Pupils are equal, round, and reactive to light.  Cardiovascular:     Rate and Rhythm: Normal rate and regular rhythm.     Pulses: Normal pulses.     Heart sounds: Normal heart sounds.  Pulmonary:     Effort: Pulmonary effort is normal. No respiratory distress.     Breath sounds: Normal breath sounds.  Abdominal:     General: Bowel sounds are normal. There is no distension.     Palpations: Abdomen is soft.     Tenderness: There is abdominal tenderness in the suprapubic area. There is no right CVA tenderness, left CVA tenderness, guarding or rebound.  Musculoskeletal:        General: Normal range of motion.     Cervical back: Normal range of motion and neck supple.  Skin:    General: Skin is warm and dry.     Capillary Refill: Capillary refill takes less than 2  seconds.  Neurological:     General: No focal deficit present.     Mental Status: He is alert and oriented to person, place, and time.     ED Results / Procedures / Treatments   Labs (all labs ordered are listed, but only abnormal results are displayed) Labs Reviewed  CBC WITH DIFFERENTIAL/PLATELET - Abnormal; Notable for the following components:      Result Value   RBC 3.98 (*)    Hemoglobin 11.3 (*)    HCT 35.6 (*)    RDW 16.7 (*)    Platelets 148 (*)  Lymphs Abs 0.6 (*)    All other components within normal limits  BASIC METABOLIC PANEL - Abnormal; Notable for the following components:   Potassium 3.2 (*)    Glucose, Bld 138 (*)    All other components within normal limits  URINALYSIS, W/ REFLEX TO CULTURE (INFECTION SUSPECTED) - Abnormal; Notable for the following components:   Hgb urine dipstick LARGE (*)    Ketones, ur 5 (*)    Leukocytes,Ua LARGE (*)    Bacteria, UA MANY (*)    All other components within normal limits  URINE CULTURE    EKG None  Radiology No results found.  Procedures Ultrasound ED Renal  Date/Time: 01/31/2023 8:14 AM  Performed by: Linwood Dibbles, PA-C Authorized by: Linwood Dibbles, PA-C   Procedure details:    Indications: urinary retention     Technique:  BladderImages: archivedStudy Limitations: bowel gas Bladder findings:    Bladder:  Visualized   Free pelvic fluid: not identified     Volume:  600 Comments:     No obvious blood clot     Medications Ordered in ED Medications - No data to display  ED Course/ Medical Decision Making/ A&P   89 year old here for evaluation of urinary retention.  Seen by urology yesterday started on doxycycline.  Woke up this morning unable to void.  Tried self-catheterization x 2 without any urine return.  Did note some blood at his urethral meatus on last attempt.  He has some suprapubic fullness and pain.  He is afebrile, nonseptic, not ill-appearing.  Denies any recent injury or  trauma.  He has no flank pain.  No pain with bowel movements.  He is on Xarelto for a flutter however stopped 2 days ago when he started getting urinary symptoms as he has had hematuria previously.  Bladder scan here shows greater than 500.  Will plan on inserting Foley catheter, sending urinalysis, check labs and reassess  Labs and imaging personally viewed and interpreted:  CBC without leukocytosis, hgb 11.3 at baseline BMP potasium 3.2, normal creatine UA positive for UTI  Patient reassessed.  Bedside ultrasound performed without large blood clot in bladder.  Patient reassessed. Pain resolved with foley in place. Abd soft non tender, non distended.  Foley without large clots, scant tinged blood in bag. >400 cc in bag  Will have him continue abx rx'ed by Urology yesterday. Return for new or worsening symptoms  Patient does not meet the SIRS or Sepsis criteria.  On repeat exam patient does not have a surgical abdomin and there are no peritoneal signs.  No indication of appendicitis, bowel obstruction, bowel perforation, cholecystitis, diverticulitis, stone, obstruction, abscess.    The patient has been appropriately medically screened and/or stabilized in the ED. I have low suspicion for any other emergent medical condition which would require further screening, evaluation or treatment in the ED or require inpatient management.  Patient is hemodynamically stable and in no acute distress.  Patient able to ambulate in department prior to ED.  Evaluation does not show acute pathology that would require ongoing or additional emergent interventions while in the emergency department or further inpatient treatment.  I have discussed the diagnosis with the patient and answered all questions.  Pain is been managed while in the emergency department and patient has no further complaints prior to discharge.  Patient is comfortable with plan discussed in room and is stable for discharge at this time.  I have  discussed strict return precautions for returning to  the emergency department.  Patient was encouraged to follow-up with PCP/specialist refer to at discharge.                            Medical Decision Making Amount and/or Complexity of Data Reviewed Independent Historian:     Details: Wife in room External Data Reviewed: labs, radiology and notes. Labs: ordered. Decision-making details documented in ED Course.  Risk OTC drugs. Prescription drug management. Parenteral controlled substances. Decision regarding hospitalization. Diagnosis or treatment significantly limited by social determinants of health.          Final Clinical Impression(s) / ED Diagnoses Final diagnoses:  Urinary retention  Acute cystitis with hematuria    Rx / DC Orders ED Discharge Orders          Ordered    cephALEXin (KEFLEX) 500 MG capsule  3 times daily        01/31/23 0929              Darius Lundberg A, PA-C 01/31/23 [id number]    Gloris Manchester, MD 02/04/23 1711

## 2023-01-31 NOTE — ED Notes (Signed)
Bladder scan 526 mL. 

## 2023-01-31 NOTE — ED Triage Notes (Signed)
C/o urinary retention with hematuria.  Doxycycline prescribed yesterday with first dose last night.  Pt reports self-cath as needed and attempted this am with slight blood output and no urine.  Currently on xarelto.

## 2023-02-04 LAB — URINE CULTURE: Culture: >=100000 — AB

## 2023-02-05 ENCOUNTER — Telehealth (HOSPITAL_BASED_OUTPATIENT_CLINIC_OR_DEPARTMENT_OTHER): Payer: Self-pay | Admitting: *Deleted

## 2023-02-05 NOTE — Telephone Encounter (Signed)
Post ED Visit - Positive Culture Follow-up  Culture report reviewed by antimicrobial stewardship pharmacist: [facility] []  [address] Pharm.D. []  Celedonio Miyamoto, Pharm.D., BCPS AQ-ID []  Garvin Fila, Pharm.D., BCPS []  Georgina Pillion, Pharm.D., BCPS []  P[place]her, [place].D., BCPS, AAHIVP []  Estella Husk, Pharm.D., BCPS, AAHIVP []  Lysle Pearl, PharmD, BCPS []  Phillips Climes, PharmD, BCPS []  Agapito Games, PharmD, BCPS []  Verlan Friends, PharmD []  Mervyn Gay, PharmD, BCPS []  Vinnie Level, PharmD  [facility] []  Len Childs, PharmD []  Greer Pickerel, PharmD []  Adalberto Cole, PharmD []  Perlie Gold, Rph []  Lonell Face) Jean Rosenthal, PharmD []  Earl Many, PharmD []  Junita Push, PharmD []  Dorna Leitz, PharmD []  Terrilee Files, PharmD []  Lynann Beaver, PharmD []  Keturah Barre, PharmD []  Loralee Pacas, PharmD [x]  Bernadene Person, PharmD   Positive urine culture Treated with Cephalexin, organism sensitive to the same and no further patient follow-up is required at this time.  Virl Axe [facility] 02/05/2023, 2:02 PM

## 2023-02-05 NOTE — Progress Notes (Signed)
ED Antimicrobial Stewardship Positive Culture Follow Up   Sean Lane is an 89 y.o. male who presented to [facility] on 01/31/2023 with a chief complaint of  Chief Complaint  Patient presents with   Hematuria    Recent Results (from the past 720 hour(s))  Urine Culture     Status: Abnormal   Collection Time: 01/31/23  8:27 AM   Specimen: In/Out Cath Urine  Result Value Ref Range Status   Specimen Description   Final    IN/OUT CATH URINE Performed at [facility], [address], [place], [place] [address]    Special Requests   Final    NONE Reflexed from [id number] Performed at [facility], [address], [place], [place] [address]    Culture (A)  Final    >=100,000 COLONIES/mL ESCHERICHIA COLI 10,000 COLONIES/mL ENTEROCOCCUS FAECALIS    Report Status 02/04/2023 FINAL  Final   Organism ID, Bacteria ESCHERICHIA COLI (A)  Final   Organism ID, Bacteria ENTEROCOCCUS FAECALIS (A)  Final      Susceptibility   Escherichia coli - MIC*    AMPICILLIN 4 SENSITIVE Sensitive     CEFAZOLIN <=4 SENSITIVE Sensitive     CEFEPIME <=0.12 SENSITIVE Sensitive     CIPROFLOXACIN <=0.25 SENSITIVE Sensitive     GENTAMICIN <=1 SENSITIVE Sensitive     IMIPENEM <=0.25 SENSITIVE Sensitive     NITROFURANTOIN <=16 SENSITIVE Sensitive     TRIMETH/SULFA <=20 SENSITIVE Sensitive     AMPICILLIN/SULBACTAM <=2 SENSITIVE Sensitive     PIP/TAZO <=4 SENSITIVE Sensitive     * >=100,000 COLONIES/mL ESCHERICHIA COLI   Enterococcus faecalis - MIC*    AMPICILLIN <=2 SENSITIVE Sensitive     NITROFURANTOIN <=16 SENSITIVE Sensitive     VANCOMYCIN <=0.5 SENSITIVE Sensitive     * 10,000 COLONIES/mL ENTEROCOCCUS FAECALIS    [x]  Treated with Keflex, organism resistant to prescribed antimicrobial - Enterococcus likely a colonizer, but since taken from I/O cath, cannot rule out pathogenicity  Plan: Call for symptom check If still symptomatic (D5 Keflex), should follow up with urology ASAP  (would not treat with additional abx without Urology oversight, given multiple anatomical issues Continue Keflex until finished  ED Provider: Aline August, PA-C   Mariyah Upshaw A 02/05/2023, 1:03 PM Clinical Pharmacist [phone]
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
## 2023-02-08 ENCOUNTER — Other Ambulatory Visit: Payer: Self-pay | Admitting: Family Medicine

## 2023-02-10 ENCOUNTER — Emergency Department (HOSPITAL_COMMUNITY)
Admission: EM | Admit: 2023-02-10 | Discharge: 2023-02-10 | Disposition: A | Discharge status: Home / Self Care | Payer: Medicare Other | Attending: Emergency Medicine | Admitting: Emergency Medicine

## 2023-02-10 ENCOUNTER — Encounter (HOSPITAL_COMMUNITY): Payer: Self-pay

## 2023-02-10 ENCOUNTER — Other Ambulatory Visit: Payer: Self-pay

## 2023-02-10 DIAGNOSIS — R339 Retention of urine, unspecified: Secondary | ICD-10-CM | POA: Diagnosis not present

## 2023-02-10 DIAGNOSIS — Z7982 Long term (current) use of aspirin: Secondary | ICD-10-CM | POA: Insufficient documentation

## 2023-02-10 DIAGNOSIS — Z7901 Long term (current) use of anticoagulants: Secondary | ICD-10-CM | POA: Diagnosis not present

## 2023-02-10 NOTE — ED Provider Notes (Signed)
[place] EMERGENCY DEPARTMENT AT [facility] Provider Note   CSN: [id number] Arrival date & time: 02/10/23  2038     History  Chief Complaint  Patient presents with   Urinary Retention    Sean Lane is a 89 y.o. male.  89 yo M with a chief complaints of the urinating.  Feels that he is not having any urine come out into the catheter.  He is currently on doxycycline after seeing his urologist.  Was seen in the ER about a week ago for similar problem.  His wife has been frustrated because this seems to be happening only once using the wife.  No fevers no flank pain.        Home Medications Prior to Admission medications   Medication Sig Start Date End Date Taking? Authorizing Provider  Accu-Chek Softclix Lancets lancets USE TO CHECK BLOOD SUGAR ONCE DAILY. Dx: E11.9 11/09/22   Shelva Majestic, MD  acetaminophen (TYLENOL) 650 MG CR tablet Take 1,300 mg by mouth every 8 (eight) hours as needed for pain.    [provider]  aspirin EC 81 MG tablet Take 81 mg by mouth daily.    [provider]  Blood Glucose Monitoring Suppl (ACCU-CHEK GUIDE ME) w/Device KIT Use to test blood sugars once daily. Dx: E11.9. Last meter supplied over 5 years ago- please give patient cash pay price if this is not covered or send [place] the script you need and we will sign. 10/13/21   Shelva Majestic, MD  cholecalciferol (VITAMIN D3) 25 MCG (1000 UNIT) tablet Take 2,000 Units by mouth daily.    [provider]  clobetasol (OLUX) 0.05 % topical foam Apply topically daily. 11/23/21   Janalyn Harder, MD  CREAM BASE EX Apply 1 application topically at bedtime. Diabetics' Dry Skin Relief (applied to feet)    [provider]  DULoxetine (CYMBALTA) 20 MG capsule Take 1 capsule (20 mg total) by mouth daily. 10/09/22   Judi Saa, DO  dutasteride (AVODART) 0.5 MG capsule TAKE 1 CAPSULE DAILY 05/17/22   Shelva Majestic, MD  furosemide (LASIX) 20 MG tablet Take 1  tablet (20 mg total) by mouth as needed. 10/11/22   Shelva Majestic, MD  glucose blood (ACCU-CHEK GUIDE) test strip USE TO TEST ONCE DAILY. Dx: E11.9 11/09/22   Shelva Majestic, MD  hydrALAZINE (APRESOLINE) 10 MG tablet Take 1 tablet (10 mg total) by mouth as needed (for SBP over 180). 08/09/22   Wendall Stade, MD  lisinopril (ZESTRIL) 40 MG tablet Take 1 tablet (40 mg total) by mouth daily. 08/09/22   Wendall Stade, MD  Naphazoline-Pheniramine (OPCON-A OP) Apply 1 drop to eye daily as needed (allergies).    [provider]  nitroGLYCERIN (NITROSTAT) 0.4 MG SL tablet Place 1 tablet (0.4 mg total) under the tongue every 5 (five) minutes as needed for chest pain (for chest pain). Use up to 3 dosages, if no relief call 911. 03/31/22   Shelva Majestic, MD  Omega-3 Fatty Acids (FISH OIL) 1200 MG CAPS Take 2 capsules by mouth 2 (two) times daily.    [provider]  potassium chloride SA (KLOR-CON M) 20 MEQ tablet Take 1 tablet daily for 5 days, then take 1 tablet with furosemide as needed 09/13/22   Willow Ora, MD  rosuvastatin (CRESTOR) 10 MG tablet TAKE 1 TABLET DAILY 02/08/23   Shelva Majestic, MD  tamsulosin (FLOMAX) 0.4 MG CAPS capsule TAKE  1 CAPSULE DAILY 11/10/22   Shelva Majestic, MD  XARELTO 20 MG TABS tablet TAKE 1 TABLET DAILY WITH   SUPPER 03/17/22   Shelva Majestic, MD      Allergies    Hydromorphone hcl, Hydromorphone hcl, Ketoconazole, and Sulfonamide derivatives    Review of Systems   Review of Systems  Physical Exam Updated Vital Signs BP (!) 141/69   Pulse 70   Temp 98.6 F (37 C) (Oral)   Resp 18   SpO2 97%  Physical Exam Vitals and nursing note reviewed.  Constitutional:      Appearance: He is well-developed.  HENT:     Head: Normocephalic and atraumatic.  Eyes:     Pupils: Pupils are equal, round, and reactive to light.  Neck:     Vascular: No JVD.  Cardiovascular:     Rate and Rhythm: Normal rate and regular rhythm.     Heart  sounds: No murmur heard.    No friction rub. No gallop.  Pulmonary:     Effort: No respiratory distress.     Breath sounds: No wheezing.  Abdominal:     General: There is no distension.     Tenderness: There is no abdominal tenderness. There is no guarding or rebound.     Comments: Fullness sensation mild discomfort to the suprapubic area.  Genitourinary:    Comments: Foley catheter in place. Musculoskeletal:        General: Normal range of motion.     Cervical back: Normal range of motion and neck supple.  Skin:    Coloration: Skin is not pale.     Findings: No rash.  Neurological:     Mental Status: He is alert and oriented to person, place, and time.  Psychiatric:        Behavior: Behavior normal.     ED Results / Procedures / Treatments   Labs (all labs ordered are listed, but only abnormal results are displayed) Labs Reviewed  URINE CULTURE  URINALYSIS, ROUTINE W REFLEX MICROSCOPIC    EKG None  Radiology No results found.  Procedures Procedures    Medications Ordered in ED Medications - No data to display  ED Course/ Medical Decision Making/ A&P                             Medical Decision Making Amount and/or Complexity of Data Reviewed Labs: ordered.   89 yo M with a chief complaints of suprapubic pain and feeling like his catheter is not draining.  Going on for the better part of this afternoon.  No fevers no pain in the side.  Has been treated with antibiotics off and on for possible urinary tract infections.  Currently is finishing up a course of doxycycline.  UA here initially grossly purulent.  He has no systemic signs though and is otherwise well-appearing.  Multiple antibiotic changes recently.  After some discussion with the family plan to have them discuss with urology.   10:56 PM:  I have discussed the diagnosis/risks/treatment options with the patient.  Evaluation and diagnostic testing in the emergency department does not suggest an  emergent condition requiring admission or immediate intervention beyond what has been performed at this time.  They will follow up with PCP. We also discussed returning to the ED immediately if new or worsening sx occur. We discussed the sx which are most concerning (e.g., sudden worsening pain, fever, inability to tolerate by mouth,  inability to urinate) that necessitate immediate return. Medications administered to the patient during their visit and any new prescriptions provided to the patient are listed below.  Medications given during this visit Medications - No data to display   The patient appears reasonably screen and/or stabilized for discharge and I doubt any other medical condition or other [facility] requiring further screening, evaluation, or treatment in the ED at this time prior to discharge.          Final Clinical Impression(s) / ED Diagnoses Final diagnoses:  Urinary retention    Rx / DC Orders ED Discharge Orders     None         Melene Plan, DO 02/10/23 2256

## 2023-02-10 NOTE — ED Notes (Signed)
Lab called to add on urine culture.  ?

## 2023-02-10 NOTE — Discharge Instructions (Signed)
Please call your urologist on Monday and let them know about your visit to the ER today.  See if they want to change her antibiotics or if they want to see you in the office sooner.  Please return for fever or pain in your side.

## 2023-02-10 NOTE — ED Triage Notes (Signed)
Pt reports with urinary retention since today. Wife states that he has a foley in place but there is leakage around the catheter and "sludge" in the bag that is blood tinged.

## 2023-02-11 LAB — URINALYSIS, ROUTINE W REFLEX MICROSCOPIC
Glucose, UA: NEGATIVE mg/dL
Hgb urine dipstick: NEGATIVE
Ketones, ur: NEGATIVE mg/dL
Nitrite: NEGATIVE
Protein, ur: >=300 mg/dL — AB
Specific Gravity, Urine: 1.018 (ref 1.005–1.030)
pH: 9 — ABNORMAL HIGH (ref 5.0–8.0)

## 2023-02-13 ENCOUNTER — Telehealth: Payer: Self-pay

## 2023-02-13 ENCOUNTER — Ambulatory Visit: Payer: Self-pay

## 2023-02-13 ENCOUNTER — Encounter: Payer: Self-pay | Admitting: Family Medicine

## 2023-02-13 LAB — URINE CULTURE: Culture: >=100000 — AB

## 2023-02-13 NOTE — Telephone Encounter (Signed)
Please see pt msg and concerns and advise if ok to place new referral at pt request

## 2023-02-13 NOTE — Chronic Care Management (AMB) (Signed)
   02/13/2023  Sean Lane [birth year] [id number]   Reason for Encounter: Patient is not currently enrolled in the CCM program. CCM status changed to previously enrolled.   [facility]/Chronic Care Management [phone]

## 2023-02-13 NOTE — Transitions of Care (Post Inpatient/ED Visit) (Signed)
02/13/2023  Name: Sean Lane MRN: [id number] DOB: 14-Oct-1927  Today's TOC FU Call Status: Today's TOC FU Call Status:: Successful TOC FU Call Competed TOC FU Call Complete Date: 02/13/23  Red on EMMI-ED Discharge Alert Date & Reason:02/12/23 "Scheduled follow-up appt? No"  Transition Care Management Follow-up Telephone Call Date of Discharge: 02/10/23 Discharge Facility: [facility]) Type of Discharge: Emergency Department Reason for ED Visit: Other: ("urinary retention") How have you been since you were released from the hospital?: Same (Wife reports catheter in place-draining yellow urine. Patient continues to have some discomfort with foley at times.) Any questions or concerns?: Yes Patient Questions/Concerns:: Wife voices some frustrations with them not being able to "get answers to what's wrong with pt and this has been going on since Sept-keep having to go to ED and taking mx abxs in the past few months-states she is considering finding another urologist Patient Questions/Concerns Addressed: Other: (Wife wanted info on how to obtain another urologist -possibly with [facility] or [facility] if they chose to do so-instructed her on process)  Items Reviewed: Did you receive and understand the discharge instructions provided?: Yes Medications obtained,verified, and reconciled?: Partial Review Completed Reason for Partial Mediation Review: wife reported no med changes-managing pt's meds Any new allergies since your discharge?: No Dietary orders reviewed?: NA Do you have support at home?: Yes People in Home: spouse Name of Support/Comfort Primary Source: [name]  Medications Reviewed Today: Medications Reviewed Today     Reviewed by Charlyn Minerva, RN (Registered Nurse) on 02/13/23 at 1125  Med List Status: <None>   Medication Order Taking? Sig Documenting Provider Last Dose Status Informant  Accu-Chek Softclix Lancets lancets [id number] No USE TO CHECK BLOOD SUGAR ONCE  DAILY. Dx: E11.9 Shelva Majestic, MD Taking Active   acetaminophen (TYLENOL) 650 MG CR tablet [id number] No Take 1,300 mg by mouth every 8 (eight) hours as needed for pain. [provider] Taking Active Spouse/Significant Other  aspirin EC 81 MG tablet [id number] No Take 81 mg by mouth daily. [provider] Taking Active   Blood Glucose Monitoring Suppl (ACCU-CHEK GUIDE ME) w/Device KIT [id number] No Use to test blood sugars once daily. Dx: E11.9. Last meter supplied over 5 years ago- please give patient cash pay price if this is not covered or send [place] the script you need and we will sign. Shelva Majestic, MD Taking Active   cholecalciferol (VITAMIN D3) 25 MCG (1000 UNIT) tablet [id number] No Take 2,000 Units by mouth daily. [provider] Taking Active   clobetasol (OLUX) 0.05 % topical foam [id number] No Apply topically daily. Janalyn Harder, MD Taking Active   CREAM BASE [place] [id number] No Apply 1 application topically at bedtime. Diabetics' Dry Skin Relief (applied to feet) [provider] Taking Active Spouse/Significant Other  DULoxetine (CYMBALTA) 20 MG capsule [id number] No Take 1 capsule (20 mg total) by mouth daily. Judi Saa, DO Taking Active   dutasteride (AVODART) 0.5 MG capsule [id number] No TAKE 1 CAPSULE DAILY Shelva Majestic, MD Taking Active   furosemide (LASIX) 20 MG tablet [id number] No Take 1 tablet (20 mg total) by mouth as needed. Shelva Majestic, MD Taking Active   glucose blood (ACCU-CHEK GUIDE) test strip [id number] No USE TO TEST ONCE DAILY. Dx: E11.9 Shelva Majestic, MD Taking Active   hydrALAZINE (APRESOLINE) 10 MG tablet [id number] No Take 1 tablet (10 mg total) by mouth as needed (for SBP over 180). Wendall Stade, MD Taking Active  lisinopril (ZESTRIL) 40 MG tablet [id number] No Take 1 tablet (40 mg total) by mouth daily. Wendall Stade, MD Taking Active   Naphazoline-Pheniramine [facility] OP) [id number] No Apply 1 drop to eye  daily as needed (allergies). [provider] Taking Active Spouse/Significant Other  nitroGLYCERIN (NITROSTAT) 0.4 MG SL tablet [id number] No Place 1 tablet (0.4 mg total) under the tongue every 5 (five) minutes as needed for chest pain (for chest pain). Use up to 3 dosages, if no relief call 911. Shelva Majestic, MD Taking Active   Omega-3 Fatty Acids (FISH OIL) 1200 MG CAPS [id number] No Take 2 capsules by mouth 2 (two) times daily. [provider] Taking Active   potassium chloride SA (KLOR-CON M) 20 MEQ tablet [id number] No Take 1 tablet daily for 5 days, then take 1 tablet with furosemide as needed Willow Ora, MD Taking Active   rosuvastatin (CRESTOR) 10 MG tablet [id number]  TAKE 1 TABLET DAILY Shelva Majestic, MD  Active   tamsulosin (FLOMAX) 0.4 MG CAPS capsule [id number] No TAKE 1 CAPSULE DAILY Shelva Majestic, MD Taking Active   XARELTO 20 MG TABS tablet [id number] No TAKE 1 TABLET DAILY WITH   SUPPER Shelva Majestic, MD Taking Active             Home Care and Equipment/Supplies: Were Home Health Services Ordered?: NA Any new equipment or medical supplies ordered?: NA  Functional Questionnaire: Do you need assistance with bathing/showering or dressing?: No Do you need assistance with meal preparation?: No Do you need assistance with eating?: No Do you have difficulty maintaining continence: Yes Do you need assistance with getting out of bed/getting out of a chair/moving?: No Do you have difficulty managing or taking your medications?: No  Follow up appointments reviewed: PCP Follow-up appointment confirmed?: NA MD Provider Line [phone] Given: No Specialist Hospital Follow-up appointment confirmed?: Yes Date of Specialist follow-up appointment?: 02/15/23 Follow-Up Specialty Provider:: [facility] Do you need transportation to your follow-up appointment?: No Do you understand care options if your condition(s) worsen?: Yes-patient  verbalized understanding   TOC Interventions Today    Flowsheet Row Most Recent Value  TOC Interventions   TOC Interventions Discussed/Reviewed TOC Interventions Discussed      Interventions Today    Flowsheet Row Most Recent Value  General Interventions   General Interventions Discussed/Reviewed General Interventions Discussed, Doctor Visits  Doctor Visits Discussed/Reviewed Doctor Visits Discussed, PCP, Specialist  PCP/Specialist Visits Compliance with follow-up visit  Education Interventions   Education Provided Provided Education  Provided Verbal Education On Nutrition, When to see the doctor  Nutrition Interventions   Nutrition Discussed/Reviewed Nutrition Discussed  Pharmacy Interventions   Pharmacy Dicussed/Reviewed Pharmacy Topics Discussed, Medications and their functions  Safety Interventions   Safety Discussed/Reviewed Safety Discussed       Alessandra Grout [facility] Care Management Care Management Community Coordinator Direct Phone: [phone] Toll Free: [phone] Fax: [phone]

## 2023-02-14 ENCOUNTER — Telehealth (HOSPITAL_BASED_OUTPATIENT_CLINIC_OR_DEPARTMENT_OTHER): Payer: Self-pay

## 2023-02-14 ENCOUNTER — Other Ambulatory Visit: Payer: Self-pay

## 2023-02-14 DIAGNOSIS — R339 Retention of urine, unspecified: Secondary | ICD-10-CM

## 2023-02-14 NOTE — Progress Notes (Signed)
ED Antimicrobial Stewardship Positive Culture Follow Up   Sean Lane is an 89 y.o. male who presented to [facility] on 02/10/2023 with a chief complaint of  Chief Complaint  Patient presents with   Urinary Retention    Recent Results (from the past 720 hour(s))  Urine Culture     Status: Abnormal   Collection Time: 01/31/23  8:27 AM   Specimen: In/Out Cath Urine  Result Value Ref Range Status   Specimen Description   Final    IN/OUT CATH URINE Performed at [facility], [address], [place], [place] [address]    Special Requests   Final    NONE Reflexed from [id number] Performed at [facility], [address] [place], [place] [address]    Culture (A)  Final    >=100,000 COLONIES/mL ESCHERICHIA COLI 10,000 COLONIES/mL ENTEROCOCCUS FAECALIS    Report Status 02/04/2023 FINAL  Final   Organism ID, Bacteria ESCHERICHIA COLI (A)  Final   Organism ID, Bacteria ENTEROCOCCUS FAECALIS (A)  Final      Susceptibility   Escherichia coli - MIC*    AMPICILLIN 4 SENSITIVE Sensitive     CEFAZOLIN <=4 SENSITIVE Sensitive     CEFEPIME <=0.12 SENSITIVE Sensitive     CIPROFLOXACIN <=0.25 SENSITIVE Sensitive     GENTAMICIN <=1 SENSITIVE Sensitive     IMIPENEM <=0.25 SENSITIVE Sensitive     NITROFURANTOIN <=16 SENSITIVE Sensitive     TRIMETH/SULFA <=20 SENSITIVE Sensitive     AMPICILLIN/SULBACTAM <=2 SENSITIVE Sensitive     PIP/TAZO <=4 SENSITIVE Sensitive     * >=100,000 COLONIES/mL ESCHERICHIA COLI   Enterococcus faecalis - MIC*    AMPICILLIN <=2 SENSITIVE Sensitive     NITROFURANTOIN <=16 SENSITIVE Sensitive     VANCOMYCIN <=0.5 SENSITIVE Sensitive     * 10,000 COLONIES/mL ENTEROCOCCUS FAECALIS  Urine Culture     Status: Abnormal   Collection Time: 02/10/23 11:43 PM   Specimen: Urine, Catheterized  Result Value Ref Range Status   Specimen Description   Final    URINE, CATHETERIZED Performed at [facility], [address],  [place], [place] [address]    Special Requests   Final    NONE Performed at [facility], [address], [place], [place] [address]    Culture >=100,000 COLONIES/mL PROTEUS MIRABILIS (A)  Final   Report Status 02/13/2023 FINAL  Final   Organism ID, Bacteria PROTEUS MIRABILIS (A)  Final      Susceptibility   Proteus mirabilis - MIC*    AMPICILLIN <=2 SENSITIVE Sensitive     CEFAZOLIN <=4 SENSITIVE Sensitive     CEFEPIME <=0.12 SENSITIVE Sensitive     CEFTRIAXONE <=0.25 SENSITIVE Sensitive     CIPROFLOXACIN <=0.25 SENSITIVE Sensitive     GENTAMICIN <=1 SENSITIVE Sensitive     IMIPENEM 2 SENSITIVE Sensitive     NITROFURANTOIN 128 RESISTANT Resistant     TRIMETH/SULFA <=20 SENSITIVE Sensitive     AMPICILLIN/SULBACTAM <=2 SENSITIVE Sensitive     PIP/TAZO <=4 SENSITIVE Sensitive     * >=100,000 COLONIES/mL PROTEUS MIRABILIS   Patient presented with urinary retention. Has had multiple recent visits. Currently on doxycycline, indication unknown. Also trying to get urgent follow up with [facility]. Currently follows up with [facility].  Urine culture shows > 100K of proteus but not experiencing any urinary symptoms indicative of UTI. Most likely asymptomatic bacteruria. Has had multiple bacteria grow in recent cultures with multiple different courses of antibiotics.  Plan: No change needed at this time Patient to follow up with Urology   ED Provider: Renne Crigler PA-C  Enzo Bi, PharmD, BCPS, BCIDP Clinical Pharmacist 02/14/2023 10:19 AM

## 2023-02-14 NOTE — Telephone Encounter (Signed)
Post ED Visit - Positive Culture Follow-up  Culture report reviewed by antimicrobial stewardship pharmacist: [facility] [x]  Enzo Bi, Pharm.D. []  Celedonio Miyamoto, Pharm.D., BCPS AQ-ID []  Garvin Fila, Pharm.D., BCPS []  Georgina Pillion, Pharm.D., BCPS []  [place], [place].D., BCPS, AAHIVP []  Estella Husk, Pharm.D., BCPS, AAHIVP []  Lysle Pearl, PharmD, BCPS []  Phillips Climes, PharmD, BCPS []  Agapito Games, PharmD, BCPS []  Verlan Friends, PharmD []  Mervyn Gay, PharmD, BCPS []  Vinnie Level, PharmD  [facility] []  Len Childs, PharmD []  Greer Pickerel, PharmD []  Adalberto Cole, PharmD []  Perlie Gold, Rph []  Lonell Face) Jean Rosenthal, PharmD []  Earl Many, PharmD []  Junita Push, PharmD []  Dorna Leitz, PharmD []  Terrilee Files, PharmD []  Lynann Beaver, PharmD []  Keturah Barre, PharmD []  Loralee Pacas, PharmD []  Bernadene Person, PharmD   Positive urine culture Pt has been taking Doxycycline, no systemic issues. No change needed pt to f/u with urgent referral to [facility]. no further patient follow-up is required at this time.  Sandria Senter 02/14/2023, 10:46 AM

## 2023-02-15 ENCOUNTER — Emergency Department (HOSPITAL_COMMUNITY)
Admission: EM | Admit: 2023-02-15 | Discharge: 2023-02-16 | Disposition: A | Discharge status: Home / Self Care | Payer: Medicare Other | Attending: Emergency Medicine | Admitting: Emergency Medicine

## 2023-02-15 ENCOUNTER — Other Ambulatory Visit: Payer: Self-pay

## 2023-02-15 ENCOUNTER — Encounter (HOSPITAL_COMMUNITY): Payer: Self-pay

## 2023-02-15 ENCOUNTER — Encounter: Payer: Self-pay | Admitting: Cardiovascular Disease

## 2023-02-15 ENCOUNTER — Other Ambulatory Visit: Payer: Self-pay | Admitting: Family Medicine

## 2023-02-15 ENCOUNTER — Ambulatory Visit (INDEPENDENT_AMBULATORY_CARE_PROVIDER_SITE_OTHER): Payer: Medicare Other | Admitting: Cardiovascular Disease

## 2023-02-15 VITALS — BP 110/60 | HR 67 | Ht 73.0 in | Wt 166.4 lb

## 2023-02-15 DIAGNOSIS — N401 Enlarged prostate with lower urinary tract symptoms: Secondary | ICD-10-CM | POA: Diagnosis not present

## 2023-02-15 DIAGNOSIS — I2510 Atherosclerotic heart disease of native coronary artery without angina pectoris: Secondary | ICD-10-CM | POA: Diagnosis not present

## 2023-02-15 DIAGNOSIS — Y732 Prosthetic and other implants, materials and accessory gastroenterology and urology devices associated with adverse incidents: Secondary | ICD-10-CM | POA: Insufficient documentation

## 2023-02-15 DIAGNOSIS — T839XXA Unspecified complication of genitourinary prosthetic device, implant and graft, initial encounter: Secondary | ICD-10-CM | POA: Insufficient documentation

## 2023-02-15 DIAGNOSIS — I4892 Unspecified atrial flutter: Secondary | ICD-10-CM | POA: Insufficient documentation

## 2023-02-15 DIAGNOSIS — R310 Gross hematuria: Secondary | ICD-10-CM | POA: Diagnosis not present

## 2023-02-15 DIAGNOSIS — T83091A Other mechanical complication of indwelling urethral catheter, initial encounter: Secondary | ICD-10-CM | POA: Diagnosis not present

## 2023-02-15 DIAGNOSIS — I10 Essential (primary) hypertension: Secondary | ICD-10-CM | POA: Insufficient documentation

## 2023-02-15 DIAGNOSIS — I480 Paroxysmal atrial fibrillation: Secondary | ICD-10-CM | POA: Insufficient documentation

## 2023-02-15 DIAGNOSIS — N138 Other obstructive and reflux uropathy: Secondary | ICD-10-CM | POA: Diagnosis not present

## 2023-02-15 DIAGNOSIS — N200 Calculus of kidney: Secondary | ICD-10-CM | POA: Diagnosis not present

## 2023-02-15 DIAGNOSIS — K5730 Diverticulosis of large intestine without perforation or abscess without bleeding: Secondary | ICD-10-CM | POA: Diagnosis not present

## 2023-02-15 NOTE — ED Triage Notes (Signed)
Patient reports he had catheter changed out on Saturday due to it being blocked, thinks it is blocked again tonight. Noted decreased urine output tonight and increased pain/pressure.

## 2023-02-15 NOTE — Patient Instructions (Signed)
Medication Instructions:  Your physician recommends that you continue on your current medications as directed. Please refer to the Current Medication list given to you today.  *If you need a refill on your cardiac medications before your next appointment, please call your pharmacy*  Lab Work: If you have labs (blood work) drawn today and your tests are completely normal, you will receive your results only by: MyChart Message (if you have MyChart) OR A paper copy in the mail If you have any lab test that is abnormal or we need to change your treatment, we will call you to review the results.  Testing/Procedures: None ordered today.  Follow-Up: At [facility], you and your health needs are our priority.  As part of our continuing mission to provide you with exceptional heart care, we have created designated Provider Care Teams.  These Care Teams include your primary Cardiologist (physician) and Advanced Practice Providers (APPs -  Physician Assistants and Nurse Practitioners) who all work together to provide you with the care you need, when you need it.  We recommend signing up for the patient portal called "MyChart".  Sign up information is provided on this After Visit Summary.  MyChart is used to connect with patients for Virtual Visits (Telemedicine).  Patients are able to view lab/test results, encounter notes, upcoming appointments, etc.  Non-urgent messages can be sent to your provider as well.   To learn more about what you can do with MyChart, go to [email or web address].    Your next appointment:   6 month(s)  Provider:   Peter Nishan, MD     

## 2023-02-16 ENCOUNTER — Ambulatory Visit: Payer: Medicare Other | Admitting: Urology

## 2023-02-16 NOTE — ED Notes (Signed)
Leg bag provided. Discharge instructions reviewed and pt states understanding. Assisted to vehicle via wheelchair.

## 2023-02-16 NOTE — Discharge Instructions (Signed)
You were seen today due to a blocked Foley catheter.  Your catheter was exchanged with quick return of urine.  Please follow-up with urology for further evaluation and management.  If you experience further urinary retention or other life-threatening symptoms please return to the emergency department.
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
## 2023-02-16 NOTE — ED Provider Notes (Signed)
[place] EMERGENCY DEPARTMENT AT [facility] Provider Note   CSN: [id number] Arrival date & time: 02/15/23  2246     History  Chief Complaint  Patient presents with   Catheter Issue    Sean Lane is a 89 y.o. male.  Patient presents to the emergency room complaining of possible urinary catheter blockage.  Patient has had multiple issues recently with his urinary catheter having blockages and has had 2 previous emergency department visits over the past 2 weeks for the same.  He was seen earlier today by urology and the physician assistant stated that he has stones in his bladder and may need surgical intervention for removal due to his frequent blockages.  The patient is supposed to have his case discussed with Dr. Marlou Porch for further recommendations.  He has no other complaints at this time.  HPI     Home Medications Prior to Admission medications   Medication Sig Start Date End Date Taking? Authorizing Provider  ACCU-CHEK GUIDE test strip USE TO TEST ONCE DAILY. DX: E11.9 02/15/23   Shelva Majestic, MD  Accu-Chek Softclix Lancets lancets USE TO CHECK BLOOD SUGAR ONCE DAILY. Dx: E11.9 11/09/22   Shelva Majestic, MD  acetaminophen (TYLENOL) 650 MG CR tablet Take 1,300 mg by mouth every 8 (eight) hours as needed for pain.    [provider]  aspirin EC 81 MG tablet Take 81 mg by mouth daily.    [provider]  Blood Glucose Monitoring Suppl (ACCU-CHEK GUIDE ME) w/Device KIT Use to test blood sugars once daily. Dx: E11.9. Last meter supplied over 5 years ago- please give patient cash pay price if this is not covered or send [place] the script you need and we will sign. 10/13/21   Shelva Majestic, MD  cholecalciferol (VITAMIN D3) 25 MCG (1000 UNIT) tablet Take 2,000 Units by mouth daily.    [provider]  clobetasol (OLUX) 0.05 % topical foam Apply topically daily. 11/23/21   Janalyn Harder, MD  CREAM BASE EX Apply 1 application topically at  bedtime. Diabetics' Dry Skin Relief (applied to feet)    [provider]  DULoxetine (CYMBALTA) 20 MG capsule Take 1 capsule (20 mg total) by mouth daily. 10/09/22   Judi Saa, DO  dutasteride (AVODART) 0.5 MG capsule TAKE 1 CAPSULE DAILY 05/17/22   Shelva Majestic, MD  furosemide (LASIX) 20 MG tablet Take 1 tablet (20 mg total) by mouth as needed. 10/11/22   Shelva Majestic, MD  hydrALAZINE (APRESOLINE) 10 MG tablet Take 1 tablet (10 mg total) by mouth as needed (for SBP over 180). 08/09/22   Wendall Stade, MD  lisinopril (ZESTRIL) 40 MG tablet Take 1 tablet (40 mg total) by mouth daily. 08/09/22   Wendall Stade, MD  Naphazoline-Pheniramine (OPCON-A OP) Apply 1 drop to eye daily as needed (allergies).    [provider]  nitroGLYCERIN (NITROSTAT) 0.4 MG SL tablet Place 1 tablet (0.4 mg total) under the tongue every 5 (five) minutes as needed for chest pain (for chest pain). Use up to 3 dosages, if no relief call 911. 03/31/22   Shelva Majestic, MD  Omega-3 Fatty Acids (FISH OIL) 1200 MG CAPS Take 2 capsules by mouth 2 (two) times daily.    [provider]  potassium chloride SA (KLOR-CON M) 20 MEQ tablet Take 1 tablet daily for 5 days, then take 1 tablet with furosemide as needed 09/13/22   Willow Ora, MD  rosuvastatin (CRESTOR) 10 MG tablet TAKE 1 TABLET DAILY 02/08/23   Shelva Majestic, MD  tamsulosin [facility]) 0.4 MG CAPS capsule TAKE 1 CAPSULE DAILY 11/10/22   Shelva Majestic, MD  XARELTO 20 MG TABS tablet TAKE 1 TABLET DAILY WITH   SUPPER Patient not taking: Reported on 02/15/2023 03/17/22   Shelva Majestic, MD      Allergies    Hydromorphone hcl, Hydromorphone hcl, Ketoconazole, and Sulfonamide derivatives    Review of Systems   Review of Systems  Physical Exam Updated Vital Signs BP 139/66   Pulse 72   Temp 98.1 F (36.7 C) (Oral)   Resp 18   Ht 6\' 1"  (1.854 m)   Wt 75 kg   SpO2 100%   BMI 21.81 kg/m  Physical Exam Vitals and nursing  note reviewed.  Constitutional:      General: He is not in acute distress.    Appearance: He is well-developed.  HENT:     Head: Normocephalic and atraumatic.  Eyes:     Conjunctiva/sclera: Conjunctivae normal.  Cardiovascular:     Rate and Rhythm: Normal rate and regular rhythm.     Heart sounds: No murmur heard. Pulmonary:     Effort: Pulmonary effort is normal. No respiratory distress.     Breath sounds: Normal breath sounds.  Abdominal:     Palpations: Abdomen is soft.     Tenderness: There is no abdominal tenderness.  Genitourinary:    Comments: Foley catheter in place Musculoskeletal:        General: No swelling.     Cervical back: Neck supple.  Skin:    General: Skin is warm and dry.     Capillary Refill: Capillary refill takes less than 2 seconds.  Neurological:     Mental Status: He is alert.  Psychiatric:        Mood and Affect: Mood normal.     ED Results / Procedures / Treatments   Labs (all labs ordered are listed, but only abnormal results are displayed) Labs Reviewed - No data to display  EKG None  Radiology No results found.  Procedures Procedures    Medications Ordered in ED Medications - No data to display  ED Course/ Medical Decision Making/ A&P                             Medical Decision Making  This patient presents to the ED for concern of Foley catheter issue, this involves an extensive number of treatment options, and is a complaint that carries with it a high risk of complications and morbidity.  The differential diagnosis includes bladder outlet obstruction, clot, infection, others   Additional history obtained:  Additional history obtained from patient's wife External records from outside source obtained and reviewed including family medicine notes for urinary retention   Test / Admission - Considered:  Patient had Foley catheter changed with 650 mL of urine in return.  Patient feels much better at this time.  Patient has  close follow-up with urology and I recommend the patient continue to follow-up with their office for further evaluation of his frequent urinary retention/blockages.  Patient and his wife voiced understanding with plan.  Return precautions provided.  Discharged home         Final Clinical Impression(s) / ED Diagnoses Final diagnoses:  Problem with Foley catheter, initial encounter [facility])    Rx / DC Orders ED Discharge Orders  None         Pamala Duffel 02/16/23 0200    Glynn Octave, MD 02/16/23 [phone]

## 2023-02-19 ENCOUNTER — Other Ambulatory Visit: Payer: Self-pay | Admitting: Urology

## 2023-02-19 ENCOUNTER — Telehealth: Payer: Self-pay | Admitting: Cardiovascular Disease

## 2023-02-19 NOTE — Telephone Encounter (Signed)
Patient with diagnosis of aflutter on Xarelto for anticoagulation.    Procedure: cystoscopy with litholapaxy Date of procedure: 03/28/23  CHA2DS2-VASc Score = 5  This indicates a 7.2% annual risk of stroke. The patient's score is based upon: CHF History: 0 HTN History: 1 Diabetes History: 1 Stroke History: 0 Vascular Disease History: 1 Age Score: 2 Gender Score: 0   CrCl 91mL/min Platelet count 148K  Ok to Xarelto for 3 days prior to procedure as requested. Would also confirm pt is still on med, med list shows Xarelto was marked as not taking on 02/15/23 cardiologist appt, no comment on this in note though.  **This guidance is not considered finalized until pre-operative APP has relayed final recommendations.**

## 2023-02-19 NOTE — Telephone Encounter (Addendum)
   Patient Name: Sean Lane  DOB: [birth year] MRN: [id number]  Primary Cardiologist: Charlton Haws, MD  Chart reviewed as part of pre-operative protocol coverage. Pre-op clearance already addressed by colleagues in earlier phone notes. To summarize recommendations:  -Preoperative: Biggest issue will be Parkinson's in regard to anesthesia and air way Vagal inputs may cause more bradycardia in setting of bifasicular block so pain control important Ok to proceed from cardiac perspective Hold xarelto 2 days before surgery  -Dr. Eden Emms  Per office protocol, Ok to hold Xarelto for 2-3 days prior to procedure as requested. Would also confirm pt is still on med, med list shows Xarelto was marked as not taking on 02/15/23 cardiologist appt, no comment on this in note though.    Can hold ASA for 5 days prior and restart when medically safe to do so.   Will route this bundled recommendation to requesting provider via Epic fax function and remove from pre-op pool. Please call with questions.  Sharlene Dory, PA-C 02/19/2023, 4:12 PM

## 2023-02-19 NOTE — Telephone Encounter (Signed)
   Pre-operative Risk Assessment    Patient Name: Sean Lane  DOB: 1928-08-08 MRN: [id number]      Request for Surgical Clearance    Procedure:   CYSTOSCOPY WITH LITHOLAPAXY     Date of Surgery:  Clearance 03/28/23                                 Surgeon:  Dr. Berniece Salines  Surgeon's Group or Practice Name:  [facility]  Phone number:  [phone] Fax number:  [phone]   Type of Clearance Requested:   - Medical  - Pharmacy:  Hold Aspirin and Rivaroxaban (Xarelto) 3 day xarelto / 5 day aspirin    Type of Anesthesia:  Spinal   Additional requests/questions:      Signed, April Henson   02/19/2023, 12:35 PM

## 2023-02-20 ENCOUNTER — Other Ambulatory Visit: Payer: Self-pay | Admitting: Family Medicine

## 2023-02-20 ENCOUNTER — Encounter (HOSPITAL_COMMUNITY): Payer: Self-pay

## 2023-02-20 ENCOUNTER — Encounter: Payer: Self-pay | Admitting: Family Medicine

## 2023-02-20 ENCOUNTER — Encounter: Payer: Self-pay | Admitting: Cardiovascular Disease

## 2023-02-21 ENCOUNTER — Telehealth: Payer: Self-pay

## 2023-02-21 NOTE — Telephone Encounter (Signed)
Transition Care Management Follow-up Telephone Call Date of discharge and from where: 02/16/2023 [facility] How have you been since you were released from the hospital? Patient is still having significant bleeding and is not feeling any better. Any questions or concerns? No  Items Reviewed: Did the pt receive and understand the discharge instructions provided? Yes  Medications obtained and verified? Yes  Other? No  Any new allergies since your discharge? No  Dietary orders reviewed? Yes Do you have support at home? Yes   Follow up appointments reviewed:  PCP Hospital f/u appt confirmed? No  Scheduled to see  on  @ . Specialist Hospital f/u appt confirmed?  Patient's wife stated he has an upcoming appointment with Urologist.  Scheduled to see  on  @ . Are transportation arrangements needed? No  If their condition worsens, is the pt aware to call PCP or go to the Emergency Dept.? Yes Was the patient provided with contact information for the PCP's office or ED? Yes Was to pt encouraged to call back with questions or concerns? Yes  Daelon Dunivan [facility]  [facility] Population Health Community Resource Care Guide   [email or web address]  ?? [phone]   Website: [email or web address]  [email or web address]

## 2023-02-22 ENCOUNTER — Ambulatory Visit: Payer: Medicare Other | Admitting: Family Medicine

## 2023-02-27 ENCOUNTER — Encounter: Payer: Self-pay | Admitting: Podiatry

## 2023-02-28 ENCOUNTER — Encounter: Payer: Self-pay | Admitting: Podiatry

## 2023-02-28 ENCOUNTER — Ambulatory Visit (INDEPENDENT_AMBULATORY_CARE_PROVIDER_SITE_OTHER): Payer: Medicare Other | Admitting: Podiatry

## 2023-02-28 VITALS — BP 125/50

## 2023-02-28 DIAGNOSIS — M79675 Pain in left toe(s): Secondary | ICD-10-CM | POA: Diagnosis not present

## 2023-02-28 DIAGNOSIS — B351 Tinea unguium: Secondary | ICD-10-CM | POA: Diagnosis not present

## 2023-02-28 DIAGNOSIS — E1151 Type 2 diabetes mellitus with diabetic peripheral angiopathy without gangrene: Secondary | ICD-10-CM

## 2023-02-28 DIAGNOSIS — M79674 Pain in right toe(s): Secondary | ICD-10-CM | POA: Diagnosis not present

## 2023-02-28 NOTE — Progress Notes (Signed)
This patient returns to my office for at risk foot care.  This patient requires this care by a professional since this patient will be at risk due to having type 2 diabetes and coagulation defect.  Patient is taking xarelto.  This patient presents to the office with his wife.  This patient is unable to cut nails himself since the patient cannot reach his nails.These nails are painful walking and wearing shoes.  This patient presents for at risk foot care today.  General Appearance  Alert, conversant and in no acute stress.  Vascular  Dorsalis pedis   pulses are  weakly palpable  bilaterally. Posterior tibial pulses are absent  Bilaterally. Capillary return is within normal limits  bilaterally. Temperature is within normal limits  bilaterally.  Neurologic  Senn-Weinstein monofilament wire test diminished   bilaterally. Muscle power within normal limits bilaterally.  Nails Thick disfigured discolored nails with subungual debris  from hallux to fifth toes bilaterally. Hammer toes 2-4  B/L.  Orthopedic  No limitations of motion  feet .  No crepitus or effusions noted.  No bony pathology or digital deformities noted.  Skin  normotropic skin with no porokeratosis noted bilaterally.  No signs of infections or ulcers noted.     Onychomycosis  Pain in right toes  Pain in left toes  Consent was obtained for treatment procedures.   Mechanical debridement of nails 1-5  bilaterally performed with a nail nipper.  Filed with dremel without incident.    Return office visit   10 weeks                 Told patient to return for periodic foot care and evaluation due to potential at risk complications.   Joanne Brander DPM  

## 2023-03-01 ENCOUNTER — Ambulatory Visit: Payer: Medicare Other | Admitting: Podiatry

## 2023-03-14 NOTE — Patient Instructions (Signed)
DUE TO COVID-19 ONLY TWO VISITORS  (aged 87 and older)  ARE ALLOWED TO COME WITH YOU AND STAY IN THE WAITING ROOM ONLY DURING PRE OP AND PROCEDURE.   **NO VISITORS ARE ALLOWED IN THE SHORT STAY AREA OR RECOVERY ROOM!!**  IF YOU WILL BE ADMITTED INTO THE HOSPITAL YOU ARE ALLOWED ONLY FOUR SUPPORT PEOPLE DURING VISITATION HOURS ONLY (7 AM -8PM)   The support person(s) must pass our screening, gel in and out, and wear a mask at all times, including in the patient's room. Patients must also wear a mask when staff or their support person are in the room. Visitors GUEST BADGE MUST BE WORN VISIBLY  One adult visitor may remain with you overnight and MUST be in the room by 8 P.M.     Your procedure is scheduled on: 03/28/23   Report to [facility] Main Entrance    Report to admitting at: 10:15 AM   Call this number if you have problems the morning of surgery [phone]   Do not eat food or drink: After Midnight.  Oral Hygiene is also important to reduce your risk of infection.                                    Remember - BRUSH YOUR TEETH THE MORNING OF SURGERY WITH YOUR REGULAR TOOTHPASTE  DENTURES WILL BE REMOVED PRIOR TO SURGERY PLEASE DO NOT APPLY "Poly grip" OR ADHESIVES!!!   Do NOT smoke after Midnight   Take these medicines the morning of surgery with A SIP OF WATER: duloxetine,dutasteride,tamsulosin.Hydralazine,tylenol as needed.  DO NOT TAKE ANY ORAL DIABETIC MEDICATIONS DAY OF YOUR SURGERY                              You may not have any metal on your body including hair pins, jewelry, and body piercing             Do not wear lotions, powders, perfumes/cologne, or deodorant              Men may shave face and neck.   Do not bring valuables to the hospital. [facility] IS NOT             RESPONSIBLE   FOR VALUABLES.   Contacts, glasses, or bridgework may not be worn into surgery.   Bring small overnight bag day of surgery.   DO NOT BRING YOUR HOME MEDICATIONS  TO THE HOSPITAL. PHARMACY WILL DISPENSE MEDICATIONS LISTED ON YOUR MEDICATION LIST TO YOU DURING YOUR ADMISSION IN THE HOSPITAL!    Patients discharged on the day of surgery will not be allowed to drive home.  Someone NEEDS to stay with you for the first 24 hours after anesthesia.   Special Instructions: Bring a copy of your healthcare power of attorney and living will documents         the day of surgery if you haven't scanned them before.              Please read over the following fact sheets you were given: IF YOU HAVE QUESTIONS ABOUT YOUR PRE-OP INSTRUCTIONS PLEASE CALL [phone]    [facility] - Preparing for Surgery Before surgery, you can play an important role.  Because skin is not sterile, your skin needs to be as free of germs as possible.  You can reduce the  number of germs on your skin by washing with CHG (chlorahexidine gluconate) soap before surgery.  CHG is an antiseptic cleaner which kills germs and bonds with the skin to continue killing germs even after washing. Please DO NOT use if you have an allergy to CHG or antibacterial soaps.  If your skin becomes reddened/irritated stop using the CHG and inform your nurse when you arrive at Short Stay. Do not shave (including legs and underarms) for at least 48 hours prior to the first CHG shower.  You may shave your face/neck. Please follow these instructions carefully:  1.  Shower with CHG Soap the night before surgery and the  morning of Surgery.  2.  If you choose to wash your hair, wash your hair first as usual with your  normal  shampoo.  3.  After you shampoo, rinse your hair and body thoroughly to remove the  shampoo.                           4.  Use CHG as you would any other liquid soap.  You can apply chg directly  to the skin and wash                       Gently with a scrungie or clean washcloth.  5.  Apply the CHG Soap to your body ONLY FROM THE NECK DOWN.   Do not use on face/ open                           Wound or  open sores. Avoid contact with eyes, ears mouth and genitals (private parts).                       Wash face,  Genitals (private parts) with your normal soap.             6.  Wash thoroughly, paying special attention to the area where your surgery  will be performed.  7.  Thoroughly rinse your body with warm water from the neck down.  8.  DO NOT shower/wash with your normal soap after using and rinsing off  the CHG Soap.                9.  Pat yourself dry with a clean towel.            10.  Wear clean pajamas.            11.  Place clean sheets on your bed the night of your first shower and do not  sleep with pets. Day of Surgery : Do not apply any lotions/deodorants the morning of surgery.  Please wear clean clothes to the hospital/surgery center.  FAILURE TO FOLLOW THESE INSTRUCTIONS MAY RESULT IN THE CANCELLATION OF YOUR SURGERY PATIENT SIGNATURE_________________________________  NURSE SIGNATURE__________________________________  ________________________________________________________________________

## 2023-03-16 ENCOUNTER — Encounter (HOSPITAL_COMMUNITY)
Admission: RE | Admit: 2023-03-16 | Discharge: 2023-03-16 | Disposition: A | Discharge status: Home / Self Care | Payer: Medicare Other | Source: Ambulatory Visit | Attending: Urology | Admitting: Urology

## 2023-03-16 ENCOUNTER — Encounter (HOSPITAL_COMMUNITY): Payer: Self-pay

## 2023-03-16 ENCOUNTER — Other Ambulatory Visit: Payer: Self-pay

## 2023-03-16 VITALS — BP 117/61 | HR 70 | Temp 98.5°F | Ht 73.0 in | Wt 164.0 lb

## 2023-03-16 DIAGNOSIS — E1159 Type 2 diabetes mellitus with other circulatory complications: Secondary | ICD-10-CM | POA: Insufficient documentation

## 2023-03-16 DIAGNOSIS — I4892 Unspecified atrial flutter: Secondary | ICD-10-CM

## 2023-03-16 DIAGNOSIS — I10 Essential (primary) hypertension: Secondary | ICD-10-CM | POA: Insufficient documentation

## 2023-03-16 DIAGNOSIS — Z01818 Encounter for other preprocedural examination: Secondary | ICD-10-CM | POA: Diagnosis not present

## 2023-03-16 HISTORY — DX: Cardiac arrhythmia, unspecified: I49.9

## 2023-03-16 HISTORY — DX: Malignant (primary) neoplasm, unspecified: C80.1

## 2023-03-16 LAB — BASIC METABOLIC PANEL
Anion gap: 7 (ref 5–15)
BUN: 23 mg/dL (ref 8–23)
CO2: 25 mmol/L (ref 22–32)
Calcium: 10.1 mg/dL (ref 8.9–10.3)
Chloride: 107 mmol/L (ref 98–111)
Creatinine, Ser: 0.92 mg/dL (ref 0.61–1.24)
GFR, Estimated: >60 mL/min (ref >60)
Glucose, Bld: 128 mg/dL — ABNORMAL HIGH (ref 70–99)
Potassium: 4.7 mmol/L (ref 3.5–5.1)
Sodium: 139 mmol/L (ref 135–145)

## 2023-03-16 LAB — CBC
HCT: 38.9 % — ABNORMAL LOW (ref 39.0–52.0)
Hemoglobin: 12.1 g/dL — ABNORMAL LOW (ref 13.0–17.0)
MCH: 28.9 pg (ref 26.0–34.0)
MCHC: 31.1 g/dL (ref 30.0–36.0)
MCV: 93.1 fL (ref 80.0–100.0)
Platelets: 211 10*3/uL (ref 150–400)
RBC: 4.18 MIL/uL — ABNORMAL LOW (ref 4.22–5.81)
RDW: 18.1 % — ABNORMAL HIGH (ref 11.5–15.5)
WBC: 5.5 10*3/uL (ref 4.0–10.5)
nRBC: 0 % (ref 0.0–0.2)

## 2023-03-16 LAB — HEMOGLOBIN A1C
Hgb A1c MFr Bld: 6.3 % — ABNORMAL HIGH (ref 4.8–5.6)
Mean Plasma Glucose: 134.11 mg/dL

## 2023-03-16 LAB — GLUCOSE, CAPILLARY: Glucose-Capillary: 130 mg/dL — ABNORMAL HIGH (ref 70–99)

## 2023-03-16 NOTE — Progress Notes (Signed)
For Short Stay: COVID SWAB appointment date:  Bowel Prep reminder:   For Anesthesia: PCP - Dr. Tana Conch. Cardiologist - Dr. Charlton Haws.: Clearance: 02/19/23  Chest x-ray - 08/31/22 EKG - 08/31/22 Stress Test -  ECHO - 11/20/13 Cardiac Cath - 10/26/16 Pacemaker/ICD device last checked: Pacemaker orders received: Device Rep notified:  Spinal Cord Stimulator: N/A  Sleep Study - N/A CPAP -   Fasting Blood Sugar - 100's Checks Blood Sugar ___1__ times a week. Date and result of last Hgb A1c-6.8: 09/21/22  Last dose of GLP1 agonist- N/A GLP1 instructions:   Last dose of SGLT-2 inhibitors- N/A SGLT-2 instructions:   Blood Thinner Instructions: Xarelto: ON HOLD Aspirin Instructions: To hold it 5 days before surgery. Last Dose:  Activity level: Can go up a flight of stairs and activities of daily living without stopping and without chest pain and/or shortness of breath   Able to exercise without chest pain and/or shortness of breath  Anesthesia review: Hx: HTN,Aflutter,DIA.  Patient denies shortness of breath, fever, cough and chest pain at PAT appointment   Patient verbalized understanding of instructions that were given to them at the PAT appointment. Patient was also instructed that they will need to review over the PAT instructions again at home before surgery.
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
## 2023-03-21 NOTE — Anesthesia Preprocedure Evaluation (Addendum)
Anesthesia Evaluation  Patient identified by MRN, date of birth, ID band Patient awake    Reviewed: Allergy & Precautions, NPO status , Patient's Chart, lab work & pertinent test results  Airway Mallampati: II  TM Distance: >3 FB Neck ROM: Full    Dental no notable dental hx. (+) Teeth Intact, Dental Advisory Given   Pulmonary former smoker   Pulmonary exam normal breath sounds clear to auscultation       Cardiovascular hypertension, + CAD  Normal cardiovascular exam+ dysrhythmias + Valvular Problems/Murmurs MR  Rhythm:Regular Rate:Normal     Neuro/Psych    GI/Hepatic ,GERD  Medicated and Controlled,,  Endo/Other  diabetes, Type 2    Renal/GU Lab Results      Component                Value               Date                      NA                       139                 03/16/2023                CL                       107                 03/16/2023                K                        4.7                 03/16/2023                CO2                      25                  03/16/2023                BUN                      23                  03/16/2023                CREATININE               0.92                03/16/2023                GFRNONAA                 >60                 03/16/2023                CALCIUM                  10.1                03/16/2023  Musculoskeletal  (+) Arthritis , Osteoarthritis,    Abdominal   Peds  Hematology   Anesthesia Other Findings All: ketoconazole, Sulfa, Hydromorphone  Reproductive/Obstetrics                             Anesthesia Physical Anesthesia Plan  ASA: 3  Anesthesia Plan: General   Post-op Pain Management: Ofirmev IV (intra-op)* and Precedex   Induction: Intravenous  PONV Risk Score and Plan: 3 and Treatment may vary due to age or medical condition, Ondansetron, Propofol infusion and TIVA  Airway  Management Planned: LMA  Additional Equipment: None  Intra-op Plan:   Post-operative Plan: Extubation in OR  Informed Consent: I have reviewed the patients History and Physical, chart, labs and discussed the procedure including the risks, benefits and alternatives for the proposed anesthesia with the patient or authorized representative who has indicated his/her understanding and acceptance.     Dental advisory given  Plan Discussed with:   Anesthesia Plan Comments: (See PAT note 03/16/2023 TIVA LMA  Check Xarelto last dose sat 7/13)       Anesthesia Quick Evaluation

## 2023-03-28 ENCOUNTER — Encounter (HOSPITAL_COMMUNITY): Payer: Self-pay | Admitting: Urology

## 2023-03-28 ENCOUNTER — Ambulatory Visit (HOSPITAL_BASED_OUTPATIENT_CLINIC_OR_DEPARTMENT_OTHER): Payer: Medicare Other | Admitting: Certified Registered Nurse Anesthetist

## 2023-03-28 ENCOUNTER — Other Ambulatory Visit: Payer: Self-pay

## 2023-03-28 ENCOUNTER — Encounter (HOSPITAL_COMMUNITY)
Admission: RE | Disposition: A | Discharge status: Home / Self Care | Payer: Self-pay | Source: Home / Self Care | Attending: Urology

## 2023-03-28 ENCOUNTER — Ambulatory Visit (HOSPITAL_COMMUNITY)
Admission: RE | Admit: 2023-03-28 | Discharge: 2023-03-28 | Disposition: A | Discharge status: Home / Self Care | Payer: Medicare Other | Attending: Urology | Admitting: Urology

## 2023-03-28 ENCOUNTER — Ambulatory Visit (HOSPITAL_COMMUNITY): Payer: Medicare Other | Admitting: Physician Assistant

## 2023-03-28 DIAGNOSIS — M1990 Unspecified osteoarthritis, unspecified site: Secondary | ICD-10-CM | POA: Diagnosis not present

## 2023-03-28 DIAGNOSIS — I4892 Unspecified atrial flutter: Secondary | ICD-10-CM | POA: Diagnosis not present

## 2023-03-28 DIAGNOSIS — R311 Benign essential microscopic hematuria: Secondary | ICD-10-CM

## 2023-03-28 DIAGNOSIS — I10 Essential (primary) hypertension: Secondary | ICD-10-CM | POA: Diagnosis not present

## 2023-03-28 DIAGNOSIS — I2510 Atherosclerotic heart disease of native coronary artery without angina pectoris: Secondary | ICD-10-CM | POA: Diagnosis not present

## 2023-03-28 DIAGNOSIS — R310 Gross hematuria: Secondary | ICD-10-CM | POA: Diagnosis not present

## 2023-03-28 DIAGNOSIS — N210 Calculus in bladder: Secondary | ICD-10-CM | POA: Insufficient documentation

## 2023-03-28 DIAGNOSIS — N401 Enlarged prostate with lower urinary tract symptoms: Secondary | ICD-10-CM | POA: Insufficient documentation

## 2023-03-28 DIAGNOSIS — E119 Type 2 diabetes mellitus without complications: Secondary | ICD-10-CM | POA: Insufficient documentation

## 2023-03-28 DIAGNOSIS — Z79899 Other long term (current) drug therapy: Secondary | ICD-10-CM | POA: Insufficient documentation

## 2023-03-28 DIAGNOSIS — Z87891 Personal history of nicotine dependence: Secondary | ICD-10-CM | POA: Insufficient documentation

## 2023-03-28 DIAGNOSIS — E785 Hyperlipidemia, unspecified: Secondary | ICD-10-CM | POA: Diagnosis not present

## 2023-03-28 DIAGNOSIS — K219 Gastro-esophageal reflux disease without esophagitis: Secondary | ICD-10-CM | POA: Insufficient documentation

## 2023-03-28 DIAGNOSIS — N400 Enlarged prostate without lower urinary tract symptoms: Secondary | ICD-10-CM | POA: Diagnosis not present

## 2023-03-28 DIAGNOSIS — N138 Other obstructive and reflux uropathy: Secondary | ICD-10-CM | POA: Insufficient documentation

## 2023-03-28 DIAGNOSIS — R338 Other retention of urine: Secondary | ICD-10-CM | POA: Insufficient documentation

## 2023-03-28 DIAGNOSIS — Z7901 Long term (current) use of anticoagulants: Secondary | ICD-10-CM | POA: Diagnosis not present

## 2023-03-28 HISTORY — PX: CYSTOSCOPY WITH LITHOLAPAXY: SHX1425

## 2023-03-28 LAB — GLUCOSE, CAPILLARY
Glucose-Capillary: 129 mg/dL — ABNORMAL HIGH (ref 70–99)
Glucose-Capillary: 120 mg/dL — ABNORMAL HIGH (ref 70–99)

## 2023-03-28 SURGERY — CYSTOSCOPY, WITH BLADDER CALCULUS LITHOLAPAXY
Anesthesia: General

## 2023-03-28 MED ORDER — CHLORHEXIDINE GLUCONATE 0.12 % MT SOLN
15.0000 mL | Freq: Once | OROMUCOSAL | Status: AC
  Administered 2023-03-28: 15 mL via OROMUCOSAL

## 2023-03-28 MED ORDER — CALCIUM CARBONATE ANTACID 500 MG PO CHEW: 2.0000 | CHEWABLE_TABLET | Freq: Every day | ORAL | Status: DC | PRN

## 2023-03-28 MED ORDER — FENTANYL CITRATE (PF) 100 MCG/2ML IJ SOLN
INTRAMUSCULAR | Status: AC
  Filled 2023-03-28: qty 2

## 2023-03-28 MED ORDER — CEFAZOLIN SODIUM-DEXTROSE 2-4 GM/100ML-% IV SOLN
2.0000 g | INTRAVENOUS | Status: AC
  Administered 2023-03-28: 2 g via INTRAVENOUS
  Filled 2023-03-28: qty 100

## 2023-03-28 MED ORDER — FENTANYL CITRATE (PF) 100 MCG/2ML IJ SOLN
INTRAMUSCULAR | Status: DC | PRN
  Administered 2023-03-28 (×2): 25 ug via INTRAVENOUS
  Administered 2023-03-28: 50 ug via INTRAVENOUS

## 2023-03-28 MED ORDER — ORAL CARE MOUTH RINSE: 15.0000 mL | Freq: Once | OROMUCOSAL | Status: AC

## 2023-03-28 MED ORDER — ONDANSETRON HCL 4 MG/2ML IJ SOLN
INTRAMUSCULAR | Status: AC
  Filled 2023-03-28: qty 2

## 2023-03-28 MED ORDER — PHENYLEPHRINE 80 MCG/ML (10ML) SYRINGE FOR IV PUSH (FOR BLOOD PRESSURE SUPPORT)
PREFILLED_SYRINGE | INTRAVENOUS | Status: AC
  Filled 2023-03-28: qty 10

## 2023-03-28 MED ORDER — 0.9 % SODIUM CHLORIDE (POUR BTL) OPTIME
TOPICAL | Status: DC | PRN
  Administered 2023-03-28: 1000 mL

## 2023-03-28 MED ORDER — PHENYLEPHRINE 80 MCG/ML (10ML) SYRINGE FOR IV PUSH (FOR BLOOD PRESSURE SUPPORT)
PREFILLED_SYRINGE | INTRAVENOUS | Status: DC | PRN
  Administered 2023-03-28: 160 ug via INTRAVENOUS
  Administered 2023-03-28 (×2): 120 ug via INTRAVENOUS

## 2023-03-28 MED ORDER — ONDANSETRON HCL 4 MG/2ML IJ SOLN
INTRAMUSCULAR | Status: DC | PRN
Start: 2023-03-28 — End: 2023-03-28
  Administered 2023-03-28: 4 mg via INTRAVENOUS

## 2023-03-28 MED ORDER — ACETAMINOPHEN 10 MG/ML IV SOLN: 1000.0000 mg | Freq: Once | INTRAVENOUS | Status: DC | PRN

## 2023-03-28 MED ORDER — DEXAMETHASONE SODIUM PHOSPHATE 10 MG/ML IJ SOLN
INTRAMUSCULAR | Status: AC
  Filled 2023-03-28: qty 1

## 2023-03-28 MED ORDER — FENTANYL CITRATE PF 50 MCG/ML IJ SOSY: 25.0000 ug | PREFILLED_SYRINGE | INTRAMUSCULAR | Status: DC | PRN

## 2023-03-28 MED ORDER — PROPOFOL 500 MG/50ML IV EMUL
INTRAVENOUS | Status: DC | PRN
  Administered 2023-03-28: 150 ug/kg/min via INTRAVENOUS

## 2023-03-28 MED ORDER — EPHEDRINE SULFATE-NACL 50-0.9 MG/10ML-% IV SOSY
PREFILLED_SYRINGE | INTRAVENOUS | Status: DC | PRN
  Administered 2023-03-28: 5 mg via INTRAVENOUS

## 2023-03-28 MED ORDER — DEXAMETHASONE SODIUM PHOSPHATE 4 MG/ML IJ SOLN
INTRAMUSCULAR | Status: DC | PRN
  Administered 2023-03-28: 4 mg via INTRAVENOUS

## 2023-03-28 MED ORDER — LIDOCAINE 2% (20 MG/ML) 5 ML SYRINGE
INTRAMUSCULAR | Status: DC | PRN
  Administered 2023-03-28: 50 mg via INTRAVENOUS

## 2023-03-28 MED ORDER — DEXMEDETOMIDINE HCL IN NACL 80 MCG/20ML IV SOLN
INTRAVENOUS | Status: AC
  Filled 2023-03-28: qty 20

## 2023-03-28 MED ORDER — PROPOFOL 1000 MG/100ML IV EMUL
INTRAVENOUS | Status: AC
  Filled 2023-03-28: qty 100

## 2023-03-28 MED ORDER — EPHEDRINE 5 MG/ML INJ
INTRAVENOUS | Status: AC
  Filled 2023-03-28: qty 5

## 2023-03-28 MED ORDER — PROPOFOL 10 MG/ML IV BOLUS
INTRAVENOUS | Status: DC | PRN
Start: 2023-03-28 — End: 2023-03-28
  Administered 2023-03-28: 100 mg via INTRAVENOUS

## 2023-03-28 MED ORDER — INSULIN ASPART 100 UNIT/ML IJ SOLN: 0.0000 [IU] | INTRAMUSCULAR | Status: DC | PRN

## 2023-03-28 MED ORDER — ONDANSETRON HCL 4 MG/2ML IJ SOLN: 4.0000 mg | Freq: Once | INTRAMUSCULAR | Status: DC | PRN

## 2023-03-28 MED ORDER — LIDOCAINE HCL (PF) 2 % IJ SOLN
INTRAMUSCULAR | Status: AC
  Filled 2023-03-28: qty 5

## 2023-03-28 MED ORDER — STERILE WATER FOR IRRIGATION IR SOLN
Status: DC | PRN
  Administered 2023-03-28 (×2): 3000 mL

## 2023-03-28 MED ORDER — LACTATED RINGERS IV SOLN: INTRAVENOUS | Status: DC

## 2023-03-28 SURGICAL SUPPLY — 12 items
BAG URO CATCHER STRL LF (MISCELLANEOUS) ×2 IMPLANT
CLOTH BEACON ORANGE TIMEOUT ST (SAFETY) ×2 IMPLANT
GLOVE SURG LX STRL 7.5 STRW (GLOVE) ×2 IMPLANT
GOWN STRL REUS W/ TWL XL LVL3 (GOWN DISPOSABLE) ×2 IMPLANT
GOWN STRL REUS W/TWL XL LVL3 (GOWN DISPOSABLE) ×1
KIT TURNOVER KIT A (KITS) IMPLANT
MANIFOLD NEPTUNE II (INSTRUMENTS) ×2 IMPLANT
PACK CYSTO (CUSTOM PROCEDURE TRAY) ×2 IMPLANT
SYR TOOMEY IRRIG 70ML (MISCELLANEOUS)
SYRINGE TOOMEY IRRIG 70ML (MISCELLANEOUS) IMPLANT
TUBING CONNECTING 10 (TUBING) IMPLANT
TUBING UROLOGY SET (TUBING) ×2 IMPLANT

## 2023-03-28 NOTE — Transfer of Care (Signed)
Immediate Anesthesia Transfer of Care Note  Patient: Sean Lane  Procedure(s) Performed: CYSTOSCOPY WITH LITHOLAPAXY  Patient Location: PACU  Anesthesia Type:General  Level of Consciousness: awake and patient cooperative  Airway & Oxygen Therapy: Patient Spontanous Breathing and Patient connected to face mask  Post-op Assessment: Report given to RN and Post -op Vital signs reviewed and stable  Post vital signs: Reviewed and stable  Last Vitals:  Vitals Value Taken Time  BP 104/52 03/28/23 1311  Temp    Pulse 64 03/28/23 1313  Resp 17 03/28/23 1313  SpO2 100 % 03/28/23 1313  Vitals shown include unfiled device data.  Last Pain:  Vitals:   03/28/23 1005  TempSrc: Oral         Complications: No notable events documented.

## 2023-03-28 NOTE — Discharge Instructions (Signed)
CYSTOSCOPY HOME CARE INSTRUCTIONS  Activity: Rest for the remainder of the day.  Do not drive or operate equipment today.  You may resume normal activities in one to two days as instructed by your physician.   Meals: Drink plenty of liquids and eat light foods such as gelatin or soup this evening.  You may return to a normal meal plan tomorrow.  Return to Work: You may return to work in one to two days or as instructed by your physician.  Special Instructions / Symptoms: Call your physician if any of these symptoms occur:   -persistent or heavy bleeding  -bleeding which continues after first few urination  -large blood clots that are difficult to pass  -urine stream diminishes or stops completely  -fever equal to or higher than 101 degrees Farenheit.  -cloudy urine with a strong, foul odor  -severe pain

## 2023-03-28 NOTE — H&P (Signed)
History of urinary retention, ultimately passed a voiding trial and was taught CIC.   01/23/2023: 89 year old man who presents today with concerns of gross hematuria. This is not associated with pain. He does have frequency of urination. He denies dysuria, leakage of urine, difficulty emptying his bladder, straining on urination. He has a known renal stone as well as known bladder stones. He continues tamsulosin and finasteride. He has not had any fevers, chills, nausea.   Today the patient follows up for worsening gross hematuria. Last night the patient was voiding well, but did develop an issue with inability to void. He subsequently cath himself and since then has had significant bleeding. He denies any real associated pain. He is having worsening frequency, urgency, and some mild dysuria. The hematuria seems to be improving. The patient takes Xarelto, is held at this morning.   02/15/2023: Sean Lane is a 89 year old man who performs CIC 2-3 times per night. He presents today after he was seen in the emergency department 5 days ago due to inability to self cath, suprapubic pain, gross hematuria. He states it feels like something keeps getting stuck around his urethra and it makes it difficult to void or perform catheterization. He has been treated for 4-5 UTIs over the past 4 months. He continues tamsulosin and finasteride. He is on Xarelto.     ALLERGIES: Dilaudid ketoconazole Sulfa Drugs    MEDICATIONS: Lisinopril 40 mg tablet  Tamsulosin Hcl 0.4 mg capsule  Aspirin Ec 81 mg tablet, delayed release 0 Oral  Cream Base Ex  Duloxetine Hcl 20 mg capsule,delayed release 1 capsule PO Daily  Dutasteride 0.5 mg capsule  Freestyle Insulinx Test Strips  Furosemide 20 mg tablet  Hydralazine Hcl 10 mg tablet  Nitroglycerin 0.4 mg tablet, sublingual  Opcon-A 0.02675 %-0.315 % drops  Rosuvastatin Calcium 10 mg tablet  Tylenol Arthritis 650 mg tablet, extended release  Vitamin D3  Xarelto 20 MG Oral  Tablet Oral     GU PSH: Remove Kidney Stone - 2008       PSH Notes: Heart Surgery, Cataract Surgery, Knee Replacement, Lithotomy   NON-GU PSH: Knee replacement, Right, partial - about 2018 Revise Knee Joint - 2008 Visit Complexity (formerly GPC1X) - 01/30/2023, 11/14/2022     GU PMH: Acute Cystitis/UTI - 01/30/2023, - 09/25/2022 Gross hematuria - 01/30/2023, - 01/23/2023, - 10/11/2022, - 10/05/2022, Gross hematuria, - 2015 Renal calculus - 01/23/2023, - 08/17/2022 (Stable), - 06/03/2021, Nephrolithiasis, - 2016 BPH w/LUTS - 11/14/2022, - 10/11/2022, - 10/05/2022, - 08/17/2022, Benign prostatic hyperplasia with urinary obstruction, - 2014 Nocturia - 11/14/2022, - 2017 Obstructive and reflux uropathy, Unspec - 10/11/2022, - 10/05/2022 Urinary Retention - 10/11/2022, - 10/05/2022, - 10/04/2022, - 10/03/2022, - 09/25/2022 Elevated PSA - 08/17/2022, - 06/03/2021, Elevated prostate specific antigen (PSA), - 2014 Straining on Urination - 08/17/2022 Encounter for Prostate Cancer screening - 2019 Other microscopic hematuria, Microscopic hematuria - 2016 Abdominal Pain Unspec, Lt flank pain - 2015      PMH Notes:  2006-09-24 15:07:51 - Note: Peptic Ulcer  2006-09-24 15:07:51 - Note: Arthritis   NON-GU PMH: Encounter for general adult medical examination without abnormal findings, Encounter for preventive health examination - 2016 Personal history of other endocrine, nutritional and metabolic disease, History of diabetes mellitus - 2014, History of hypercholesterolemia, - 2014 Personal history of other diseases of the circulatory system, History of hypertension - 2014 Personal history of other diseases of the digestive system, History of esophageal reflux - 2014    FAMILY HISTORY: Family Health  Status Number - Runs In Family Urologic Disorder - Runs In Family, Father   SOCIAL HISTORY: Marital Status: Single Preferred Language: English; Race: Black or African American Current Smoking Status: Patient does not  smoke anymore. Has not smoked since 06/12/1958.  Has never drank.  Drinks 1 caffeinated drink per day. Patient's occupation is/was Retired.    REVIEW OF SYSTEMS:    GU Review Male:   Patient denies frequent urination, hard to postpone urination, burning/ pain with urination, get up at night to urinate, leakage of urine, stream starts and stops, trouble starting your stream, have to strain to urinate , erection problems, and penile pain.  Gastrointestinal (Upper):   Patient denies nausea, vomiting, and indigestion/ heartburn.  Gastrointestinal (Lower):   Patient denies diarrhea and constipation.  Constitutional:   Patient denies night sweats, weight loss, fatigue, and fever.  Skin:   Patient denies skin rash/ lesion and itching.  Eyes:   Patient denies blurred vision and double vision.  Ears/ Nose/ Throat:   Patient denies sore throat and sinus problems.  Hematologic/Lymphatic:   Patient denies swollen glands and easy bruising.  Cardiovascular:   Patient denies leg swelling and chest pains.  Respiratory:   Patient denies cough and shortness of breath.  Endocrine:   Patient denies excessive thirst.  Musculoskeletal:   Patient denies back pain and joint pain.  Neurological:   Patient denies headaches and dizziness.  Psychologic:   Patient denies depression and anxiety.   Notes: Patient just had a hospital visit on last Saturday 02/10/23 for urinary retention and he had a indwelling Foley placed. But patient and his wife was told that his urine showed that he still has infection.    VITAL SIGNS:      02/15/2023 02:59 PM  BP 138/53 mmHg  Pulse 78 /min  Temperature 98.0 F / 36.6 C   GU PHYSICAL EXAMINATION:      Notes: Foley catheter in place draining yellow urine with some slight sediment in the bag.   MULTI-SYSTEM PHYSICAL EXAMINATION:    Constitutional: Thin. No physical deformities. Normally developed. Good grooming.   Neurologic / Psychiatric: Oriented to time, oriented to place,  oriented to person. No depression, no anxiety, no agitation.      Complexity of Data:  Source Of History:  Patient  Records Review:   Previous Doctor Records, Previous Hospital Records, Previous Patient Records  X-Ray Review: C.T. Abdomen/Pelvis: Reviewed Films. Reviewed Report. Discussed With Patient.     10/04/07 04/03/07 09/24/06 10/31/05 09/20/04 04/11/04  PSA  Total PSA 1.27  1.46  3.68  4.33  3.16  4.41   Free PSA 0.31    0.96   0.91   % Free PSA 24.4    22.2   20.6     PROCEDURES:         C.T. Urogram - [id number]      Patient confirmed No Neulasta OnPro Device.   ASSESSMENT:      ICD-10 Details  1 GU:   Obstructive and reflux uropathy, Unspec - N13.9 Chronic, Worsening  2   Urinary Retention - R33.8 Acute, Uncomplicated   PLAN:            Medications New Meds: Cephalexin 250 mg tablet 1 tablet PO Q HS   #30  1 Refill(s)  Pharmacy Name:  [facility] [phone]  Address:  [address]   [place], [place] [address]  Phone:  [phone]  Fax:  [phone]  Orders X-Rays: C.T. Stone Protocol Without I.V. Contrast  X-Ray Notes: History:  Hematuria: Yes/No  Patient to see MD after exam: Yes/No  Previous exam: CT / IVP/ US/ KUB/ None  When:  Where:  Diabetic: Yes/ No  BUN/ Creatinine:  Date of last BUN Creatinine:  Weight in pounds:  Allergy- IV Contrast: Yes/ No  Conflicting diabetic meds: Yes/ No  Diabetic Meds:  Prior Authorization #: [id number]            Schedule         Document Letter(s):  Created for Patient: Clinical Summary         Notes:   CT imaging obtained today. This shows large bladder stones. This is likely the source of hematuria as well as difficulty performing self-catheterization, retention and UTI. I discussed this further with his urologist Dr. Marlou Porch who advised proceeding with cystolitholopaxy. He will likely need this under spinal instead of general anesthesia. I will place a green sheet. I also placed him  on a low-dose nightly cephalexin.   foley catheter to remain in place for now.

## 2023-03-28 NOTE — Interval H&P Note (Signed)
History and Physical Interval Note:  03/28/2023 12:04 PM  Sean Lane  has presented today for surgery, with the diagnosis of BLADDER STONES.  The various methods of treatment have been discussed with the patient and family. After consideration of risks, benefits and other options for treatment, the patient has consented to  Procedure(s) with comments: CYSTOSCOPY WITH LITHOLAPAXY (N/A) - 60 MINUTES as a surgical intervention.  The patient's history has been reviewed, patient examined, no change in status, stable for surgery.  I have reviewed the patient's chart and labs.  Questions were answered to the patient's satisfaction.     Crist Fat

## 2023-03-28 NOTE — Anesthesia Procedure Notes (Signed)
Procedure Name: LMA Insertion Date/Time: 03/28/2023 12:30 PM  Performed by: Vanessa Liberty, CRNAPre-anesthesia Checklist: Emergency Drugs available, Patient identified, Suction available and Patient being monitored Patient Re-evaluated:Patient Re-evaluated prior to induction Oxygen Delivery Method: Circle system utilized Preoxygenation: Pre-oxygenation with 100% oxygen Induction Type: IV induction Ventilation: Mask ventilation without difficulty LMA: LMA inserted LMA Size: 4.0 Number of attempts: 1 Placement Confirmation: positive ETCO2 and breath sounds checked- equal and bilateral Tube secured with: Tape Dental Injury: Teeth and Oropharynx as per pre-operative assessment

## 2023-03-28 NOTE — Anesthesia Postprocedure Evaluation (Signed)
Anesthesia Post Note  Patient: Sean Lane  Procedure(s) Performed: CYSTOSCOPY WITH LITHOLAPAXY     Patient location during evaluation: PACU Anesthesia Type: General Level of consciousness: awake and alert Pain management: pain level controlled Vital Signs Assessment: post-procedure vital signs reviewed and stable Respiratory status: spontaneous breathing, nonlabored ventilation, respiratory function stable and patient connected to nasal cannula oxygen Cardiovascular status: blood pressure returned to baseline and stable Postop Assessment: no apparent nausea or vomiting Anesthetic complications: no   No notable events documented.  Last Vitals:  Vitals:   03/28/23 1400 03/28/23 1415  BP: 129/63 (!) 143/66  Pulse: 62 69  Resp: 18   Temp:  (!) 36.4 C  SpO2: 97% 100%    Last Pain:  Vitals:   03/28/23 1400  TempSrc:   PainSc: 0-No pain                 Trevor Iha

## 2023-03-28 NOTE — Op Note (Signed)
Preoperative diagnosis:  Gross hematuria Bladder stones, 2.5 cm  Postoperative diagnosis:  Same  Procedure: Cystoscopy, cystolitholapaxy  Surgeon: Crist Fat, MD  Anesthesia: General  Complications: None  Intraoperative findings:  1: The patient had a high median bar with obstructing prostate. #2: The patient had mild trabeculation of his bladder, but otherwise no significant abnormalities.  The ureteral orifice ease were orthotopic. #3: The patient had 2.5 cm of stone in total which was brittle and easily crushed with the stone crusher forceps.  EBL: Minimal  Specimens: Bladder stone  Indication: FLOYD WADE is a 89 y.o. patient with recalcitrant gross hematuria and urinary retention in part due to large volume bladder stones.  After reviewing the management options for treatment, he elected to proceed with the above surgical procedure(s). We have discussed the potential benefits and risks of the procedure, side effects of the proposed treatment, the likelihood of the patient achieving the goals of the procedure, and any potential problems that might occur during the procedure or recuperation. Informed consent has been obtained.  Description of procedure:  Consent was obtained the preoperative holding area.  He was then brought back to the operating room placed on the table in supine position.  Anesthesia was then induced endotracheal tube was inserted.  Was placed in dorsolithotomy position prepped and draped in the routine sterile fashion.  Timeout was then performed.  21 French 30 degree cystoscope was then gently passed to the patient's urethra into the bladder under vision guidance.  Cystoscopy was performed the above findings.  I then removed the 21 French 30 degree cystoscope and exchanged for a 23 French sheath and the stone crusher forceps.  I gently introduced this into the patient's bladder under visual guidance.  I then systematically crushed the stones into  pieces that I could easily irrigate.  Once all the stones were of passable size I irrigated the bladder out several times and was able to remove all the stones.  I triple checked to make sure that there were no additional stone fragments.  I then recently called the fluid and evaluated for ongoing bleeding which I did not appreciate and as such I opted not to leave a Foley catheter.  The patient bladder was subsequently emptied and the scope was removed.  He was then extubated and returned to the PACU in stable condition.  Disposition: The patient be discharged home with no Foley catheter.

## 2023-03-29 ENCOUNTER — Encounter (HOSPITAL_COMMUNITY): Payer: Self-pay | Admitting: Urology

## 2023-03-29 ENCOUNTER — Encounter: Payer: Self-pay | Admitting: Family Medicine

## 2023-04-04 DIAGNOSIS — N3000 Acute cystitis without hematuria: Secondary | ICD-10-CM | POA: Diagnosis not present

## 2023-04-04 DIAGNOSIS — R338 Other retention of urine: Secondary | ICD-10-CM | POA: Diagnosis not present

## 2023-04-04 DIAGNOSIS — R310 Gross hematuria: Secondary | ICD-10-CM | POA: Diagnosis not present

## 2023-04-11 ENCOUNTER — Encounter (INDEPENDENT_AMBULATORY_CARE_PROVIDER_SITE_OTHER): Payer: Self-pay

## 2023-04-26 ENCOUNTER — Ambulatory Visit (INDEPENDENT_AMBULATORY_CARE_PROVIDER_SITE_OTHER): Payer: Medicare Other

## 2023-04-26 VITALS — Wt 160.0 lb

## 2023-04-26 DIAGNOSIS — Z0000 Encounter for general adult medical examination without abnormal findings: Secondary | ICD-10-CM
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
## 2023-04-26 NOTE — Patient Instructions (Signed)
Sean Lane , Thank you for taking time to come for your Medicare Wellness Visit. I appreciate your ongoing commitment to your health goals. Please review the following plan we discussed and let me know if I can assist you in the future.   Referrals/Orders/Follow-Ups/Clinician Recommendations: get strength back up   This is a list of the screening recommended for you and due dates:  Health Maintenance  Topic Date Due   Zoster (Shingles) Vaccine (1 of 2) Never done   Eye exam for diabetics  11/12/2022   COVID-19 Vaccine (5 - 2023-24 season) 11/13/2022   Flu Shot  04/12/2023   Complete foot exam   08/31/2023   Hemoglobin A1C  09/16/2023   Medicare Annual Wellness Visit  04/25/2024   DTaP/Tdap/Td vaccine (4 - Td or Tdap) 12/05/2031   Pneumonia Vaccine  Completed   HPV Vaccine  Aged Out    Advanced directives: (Copy Requested) Please bring a copy of your health care power of attorney and living will to the office to be added to your chart at your convenience.  Next Medicare Annual Wellness Visit scheduled for next year: Yes  Preventive Care 37 Years and Older, Male  Preventive care refers to lifestyle choices and visits with your health care provider that can promote health and wellness. What does preventive care include? A yearly physical exam. This is also called an annual well check. Dental exams once or twice a year. Routine eye exams. Ask your health care provider how often you should have your eyes checked. Personal lifestyle choices, including: Daily care of your teeth and gums. Regular physical activity. Eating a healthy diet. Avoiding tobacco and drug use. Limiting alcohol use. Practicing safe sex. Taking low doses of aspirin every day. Taking vitamin and mineral supplements as recommended by your health care provider. What happens during an annual well check? The services and screenings done by your health care provider during your annual well check will depend on your  age, overall health, lifestyle risk factors, and family history of disease. Counseling  Your health care provider may ask you questions about your: Alcohol use. Tobacco use. Drug use. Emotional well-being. Home and relationship well-being. Sexual activity. Eating habits. History of falls. Memory and ability to understand (cognition). Work and work Astronomer. Screening  You may have the following tests or measurements: Height, weight, and BMI. Blood pressure. Lipid and cholesterol levels. These may be checked every 5 years, or more frequently if you are over 47 years old. Skin check. Lung cancer screening. You may have this screening every year starting at age 62 if you have a 30-pack-year history of smoking and currently smoke or have quit within the past 15 years. Fecal occult blood test (FOBT) of the stool. You may have this test every year starting at age 37. Flexible sigmoidoscopy or colonoscopy. You may have a sigmoidoscopy every 5 years or a colonoscopy every 10 years starting at age 68. Prostate cancer screening. Recommendations will vary depending on your family history and other risks. Hepatitis C blood test. Hepatitis B blood test. Sexually transmitted disease (STD) testing. Diabetes screening. This is done by checking your blood sugar (glucose) after you have not eaten for a while (fasting). You may have this done every 1-3 years. Abdominal aortic aneurysm (AAA) screening. You may need this if you are a current or former smoker. Osteoporosis. You may be screened starting at age 37 if you are at high risk. Talk with your health care provider about your test results,  treatment options, and if necessary, the need for more tests. Vaccines  Your health care provider may recommend certain vaccines, such as: Influenza vaccine. This is recommended every year. Tetanus, diphtheria, and acellular pertussis (Tdap, Td) vaccine. You may need a Td booster every 10 years. Zoster  vaccine. You may need this after age 42. Pneumococcal 13-valent conjugate (PCV13) vaccine. One dose is recommended after age 39. Pneumococcal polysaccharide (PPSV23) vaccine. One dose is recommended after age 46. Talk to your health care provider about which screenings and vaccines you need and how often you need them. This information is not intended to replace advice given to you by your health care provider. Make sure you discuss any questions you have with your health care provider. Document Released: 09/24/2015 Document Revised: 05/17/2016 Document Reviewed: 06/29/2015 Elsevier Interactive Patient Education  2017 [employer].  Fall Prevention in the Home Falls can cause injuries. They can happen to people of all ages. There are many things you can do to make your home safe and to help prevent falls. What can I do on the outside of my home? Regularly fix the edges of walkways and driveways and fix any cracks. Remove anything that might make you trip as you walk through a door, such as a raised step or threshold. Trim any bushes or trees on the path to your home. Use bright outdoor lighting. Clear any walking paths of anything that might make someone trip, such as rocks or tools. Regularly check to see if handrails are loose or broken. Make sure that both sides of any steps have handrails. Any raised decks and porches should have guardrails on the edges. Have any leaves, snow, or ice cleared regularly. Use sand or salt on walking paths during winter. Clean up any spills in your garage right away. This includes oil or grease spills. What can I do in the bathroom? Use night lights. Install grab bars by the toilet and in the tub and shower. Do not use towel bars as grab bars. Use non-skid mats or decals in the tub or shower. If you need to sit down in the shower, use a plastic, non-slip stool. Keep the floor dry. Clean up any water that spills on the floor as soon as it happens. Remove  soap buildup in the tub or shower regularly. Attach bath mats securely with double-sided non-slip rug tape. Do not have throw rugs and other things on the floor that can make you trip. What can I do in the bedroom? Use night lights. Make sure that you have a light by your bed that is easy to reach. Do not use any sheets or blankets that are too big for your bed. They should not hang down onto the floor. Have a firm chair that has side arms. You can use this for support while you get dressed. Do not have throw rugs and other things on the floor that can make you trip. What can I do in the kitchen? Clean up any spills right away. Avoid walking on wet floors. Keep items that you use a lot in easy-to-reach places. If you need to reach something above you, use a strong step stool that has a grab bar. Keep electrical cords out of the way. Do not use floor polish or wax that makes floors slippery. If you must use wax, use non-skid floor wax. Do not have throw rugs and other things on the floor that can make you trip. What can I do with my stairs? Do  not leave any items on the stairs. Make sure that there are handrails on both sides of the stairs and use them. Fix handrails that are broken or loose. Make sure that handrails are as long as the stairways. Check any carpeting to make sure that it is firmly attached to the stairs. Fix any carpet that is loose or worn. Avoid having throw rugs at the top or bottom of the stairs. If you do have throw rugs, attach them to the floor with carpet tape. Make sure that you have a light switch at the top of the stairs and the bottom of the stairs. If you do not have them, ask someone to add them for you. What else can I do to help prevent falls? Wear shoes that: Do not have high heels. Have rubber bottoms. Are comfortable and fit you well. Are closed at the toe. Do not wear sandals. If you use a stepladder: Make sure that it is fully opened. Do not climb a  closed stepladder. Make sure that both sides of the stepladder are locked into place. Ask someone to hold it for you, if possible. Clearly mark and make sure that you can see: Any grab bars or handrails. First and last steps. Where the edge of each step is. Use tools that help you move around (mobility aids) if they are needed. These include: Canes. Walkers. Scooters. Crutches. Turn on the lights when you go into a dark area. Replace any light bulbs as soon as they burn out. Set up your furniture so you have a clear path. Avoid moving your furniture around. If any of your floors are uneven, fix them. If there are any pets around you, be aware of where they are. Review your medicines with your doctor. Some medicines can make you feel dizzy. This can increase your chance of falling. Ask your doctor what other things that you can do to help prevent falls. This information is not intended to replace advice given to you by your health care provider. Make sure you discuss any questions you have with your health care provider. Document Released: 06/24/2009 Document Revised: 02/03/2016 Document Reviewed: 10/02/2014 Elsevier Interactive Patient Education  2017 [employer].

## 2023-05-01 ENCOUNTER — Emergency Department (HOSPITAL_COMMUNITY)
Admission: EM | Admit: 2023-05-01 | Discharge: 2023-05-01 | Disposition: A | Discharge status: Home / Self Care | Payer: Medicare Other | Attending: Emergency Medicine | Admitting: Emergency Medicine

## 2023-05-01 ENCOUNTER — Other Ambulatory Visit: Payer: Self-pay

## 2023-05-01 DIAGNOSIS — N390 Urinary tract infection, site not specified: Secondary | ICD-10-CM | POA: Insufficient documentation

## 2023-05-01 DIAGNOSIS — E119 Type 2 diabetes mellitus without complications: Secondary | ICD-10-CM | POA: Insufficient documentation

## 2023-05-01 DIAGNOSIS — R42 Dizziness and giddiness: Secondary | ICD-10-CM | POA: Diagnosis not present

## 2023-05-01 DIAGNOSIS — I2510 Atherosclerotic heart disease of native coronary artery without angina pectoris: Secondary | ICD-10-CM | POA: Insufficient documentation

## 2023-05-01 DIAGNOSIS — Z7982 Long term (current) use of aspirin: Secondary | ICD-10-CM | POA: Insufficient documentation

## 2023-05-01 DIAGNOSIS — Z1152 Encounter for screening for COVID-19: Secondary | ICD-10-CM | POA: Diagnosis not present

## 2023-05-01 DIAGNOSIS — R338 Other retention of urine: Secondary | ICD-10-CM

## 2023-05-01 DIAGNOSIS — R339 Retention of urine, unspecified: Secondary | ICD-10-CM | POA: Diagnosis present

## 2023-05-01 DIAGNOSIS — K563 Gallstone ileus: Secondary | ICD-10-CM | POA: Diagnosis not present

## 2023-05-01 DIAGNOSIS — I10 Essential (primary) hypertension: Secondary | ICD-10-CM | POA: Diagnosis not present

## 2023-05-01 LAB — CBC WITH DIFFERENTIAL/PLATELET
Abs Immature Granulocytes: 0.03 10*3/uL (ref 0.00–0.07)
Basophils Absolute: 0 10*3/uL (ref 0.0–0.1)
Basophils Relative: 1 %
Eosinophils Absolute: 0 10*3/uL (ref 0.0–0.5)
Eosinophils Relative: 0 %
HCT: 37.4 % — ABNORMAL LOW (ref 39.0–52.0)
Hemoglobin: 12 g/dL — ABNORMAL LOW (ref 13.0–17.0)
Immature Granulocytes: 0 %
Lymphocytes Relative: 7 %
Lymphs Abs: 0.5 10*3/uL — ABNORMAL LOW (ref 0.7–4.0)
MCH: 30.1 pg (ref 26.0–34.0)
MCHC: 32.1 g/dL (ref 30.0–36.0)
MCV: 93.7 fL (ref 80.0–100.0)
Monocytes Absolute: 0.6 10*3/uL (ref 0.1–1.0)
Monocytes Relative: 7 %
Neutro Abs: 6.6 10*3/uL (ref 1.7–7.7)
Neutrophils Relative %: 85 %
Platelets: 174 10*3/uL (ref 150–400)
RBC: 3.99 MIL/uL — ABNORMAL LOW (ref 4.22–5.81)
RDW: 16.3 % — ABNORMAL HIGH (ref 11.5–15.5)
WBC: 7.7 10*3/uL (ref 4.0–10.5)
nRBC: 0 % (ref 0.0–0.2)

## 2023-05-01 LAB — URINALYSIS, W/ REFLEX TO CULTURE (INFECTION SUSPECTED)
Bilirubin Urine: NEGATIVE
Glucose, UA: NEGATIVE mg/dL
Ketones, ur: NEGATIVE mg/dL
Nitrite: NEGATIVE
Protein, ur: NEGATIVE mg/dL
Specific Gravity, Urine: 1.008 (ref 1.005–1.030)
WBC, UA: >50 WBC/hpf (ref 0–5)
pH: 5 (ref 5.0–8.0)

## 2023-05-01 LAB — SARS CORONAVIRUS 2 BY RT PCR: SARS Coronavirus 2 by RT PCR: NEGATIVE

## 2023-05-01 LAB — COMPREHENSIVE METABOLIC PANEL
ALT: 23 U/L (ref 0–44)
AST: 24 U/L (ref 15–41)
Albumin: 3.8 g/dL (ref 3.5–5.0)
Alkaline Phosphatase: 98 U/L (ref 38–126)
Anion gap: 11 (ref 5–15)
BUN: 16 mg/dL (ref 8–23)
CO2: 22 mmol/L (ref 22–32)
Calcium: 9.5 mg/dL (ref 8.9–10.3)
Chloride: 100 mmol/L (ref 98–111)
Creatinine, Ser: 0.95 mg/dL (ref 0.61–1.24)
GFR, Estimated: >60 mL/min (ref >60)
Glucose, Bld: 169 mg/dL — ABNORMAL HIGH (ref 70–99)
Potassium: 3.3 mmol/L — ABNORMAL LOW (ref 3.5–5.1)
Sodium: 133 mmol/L — ABNORMAL LOW (ref 135–145)
Total Bilirubin: 1.2 mg/dL (ref 0.3–1.2)
Total Protein: 6.9 g/dL (ref 6.5–8.1)

## 2023-05-01 MED ORDER — CEPHALEXIN 500 MG PO CAPS: 500.0000 mg | ORAL_CAPSULE | Freq: Two times a day (BID) | ORAL | 0 refills | Status: AC

## 2023-05-01 MED ORDER — CEPHALEXIN 500 MG PO CAPS
500.0000 mg | ORAL_CAPSULE | Freq: Once | ORAL | Status: AC
  Administered 2023-05-01: 500 mg via ORAL
  Filled 2023-05-01: qty 1

## 2023-05-01 NOTE — Discharge Instructions (Addendum)
As discussed, your evaluation today has been largely reassuring.  But, it is important that you monitor your condition carefully, and do not hesitate to return to the ED if you develop new, or concerning changes in your condition. ? ?Otherwise, please follow-up with your physician for appropriate ongoing care. ? ?

## 2023-05-01 NOTE — ED Provider Notes (Signed)
[facility] EMERGENCY DEPARTMENT AT [facility] Provider Note   CSN: [id number] Arrival date & time: 05/01/23  1100     History  Chief Complaint  Patient presents with   Urinary Retention    Sean Lane is a 89 y.o. male.  HPI Patient arrives via EMS with concern for abdominal discomfort, decreased urine production as well as generalized flulike illness.  Seems as though the flulike illness has been present for several days, but over the past few hours patient has had minimal urine production, and after an attempt to self cath was unsuccessful he called EMS.  Reports no hemodynamic instability, patient has known A-fib and this was demonstrated on their transport.  He was awake, alert throughout.    Home Medications Prior to Admission medications   Medication Sig Start Date End Date Taking? Authorizing Provider  cephALEXin (KEFLEX) 500 MG capsule Take 1 capsule (500 mg total) by mouth 2 (two) times daily for 5 days. 05/01/23 05/06/23 Yes Gerhard Munch, MD  ACCU-CHEK GUIDE test strip USE TO TEST ONCE DAILY. DX: E11.9 02/15/23   Shelva Majestic, MD  Accu-Chek Softclix Lancets lancets USE TO CHECK BLOOD SUGAR ONCE DAILY. Dx: E11.9 11/09/22   Shelva Majestic, MD  acetaminophen (TYLENOL) 650 MG CR tablet Take 1,300 mg by mouth 3 (three) times daily.    [provider]  aspirin EC 81 MG tablet Take 81 mg by mouth daily.    [provider]  Blood Glucose Monitoring Suppl (ACCU-CHEK GUIDE ME) w/Device KIT Use to test blood sugars once daily. Dx: E11.9. Last meter supplied over 5 years ago- please give patient cash pay price if this is not covered or send [place] the script you need and we will sign. 10/13/21   Shelva Majestic, MD  calcium carbonate (TUMS - DOSED IN MG ELEMENTAL CALCIUM) 500 MG chewable tablet Chew 2 tablets (400 mg of elemental calcium total) by mouth daily as needed for indigestion or heartburn. 03/28/23   Crist Fat, MD  cholecalciferol  (VITAMIN D3) 25 MCG (1000 UNIT) tablet Take 2,000 Units by mouth daily.    [provider]  clobetasol (OLUX) 0.05 % topical foam Apply topically daily. Patient taking differently: Apply 1 Application topically daily as needed (itchy scalp). 11/23/21   Janalyn Harder, MD  DULoxetine (CYMBALTA) 20 MG capsule Take 1 capsule (20 mg total) by mouth daily. 10/09/22   Judi Saa, DO  dutasteride (AVODART) 0.5 MG capsule TAKE 1 CAPSULE DAILY 05/17/22   Shelva Majestic, MD  Emollient (GOLD BOND DIABETICS DRY SKIN) CREA Apply 1 Application topically daily as needed (dry skin).    [provider]  furosemide (LASIX) 20 MG tablet Take 1 tablet (20 mg total) by mouth as needed. 10/11/22   Shelva Majestic, MD  hydrALAZINE (APRESOLINE) 10 MG tablet Take 1 tablet (10 mg total) by mouth as needed (for SBP over 180). 08/09/22   Wendall Stade, MD  lisinopril (ZESTRIL) 40 MG tablet Take 1 tablet (40 mg total) by mouth daily. 08/09/22   Wendall Stade, MD  Naphazoline-Pheniramine (OPCON-A OP) Apply 1 drop to eye daily as needed (allergies).    [provider]  nitroGLYCERIN (NITROSTAT) 0.4 MG SL tablet Place 1 tablet (0.4 mg total) under the tongue every 5 (five) minutes as needed for chest pain (for chest pain). Use up to 3 dosages, if no relief call 911. 03/31/22   Shelva Majestic, MD  oxymetazoline (AFRIN) 0.05 %  nasal spray Place 1 spray into both nostrils 2 (two) times daily as needed for congestion.    [provider]  rosuvastatin (CRESTOR) 10 MG tablet TAKE 1 TABLET DAILY 02/08/23   Shelva Majestic, MD  tamsulosin [facility]) 0.4 MG CAPS capsule TAKE 1 CAPSULE DAILY 11/10/22   Shelva Majestic, MD      Allergies    Hydromorphone hcl, Ketoconazole, and Sulfonamide derivatives    Review of Systems   Review of Systems  All other systems reviewed and are negative.   Physical Exam Updated Vital Signs BP (!) 128/57   Pulse 86   Temp 98.9 F (37.2 C) (Oral)   Resp  16   Ht 6\' 1"  (1.854 m)   Wt 73.9 kg   SpO2 94%   BMI 21.51 kg/m  Physical Exam Vitals and nursing note reviewed.  Constitutional:      General: He is not in acute distress.    Appearance: He is well-developed.  HENT:     Head: Normocephalic and atraumatic.  Eyes:     Conjunctiva/sclera: Conjunctivae normal.  Cardiovascular:     Rate and Rhythm: Normal rate. Rhythm irregular.  Pulmonary:     Effort: Pulmonary effort is normal. No respiratory distress.     Breath sounds: No stridor.  Abdominal:     General: There is no distension.  Skin:    General: Skin is warm and dry.  Neurological:     Mental Status: He is alert and oriented to person, place, and time.     ED Results / Procedures / Treatments   Labs (all labs ordered are listed, but only abnormal results are displayed) Labs Reviewed  COMPREHENSIVE METABOLIC PANEL - Abnormal; Notable for the following components:      Result Value   Sodium 133 (*)    Potassium 3.3 (*)    Glucose, Bld 169 (*)    All other components within normal limits  CBC WITH DIFFERENTIAL/PLATELET - Abnormal; Notable for the following components:   RBC 3.99 (*)    Hemoglobin 12.0 (*)    HCT 37.4 (*)    RDW 16.3 (*)    Lymphs Abs 0.5 (*)    All other components within normal limits  URINALYSIS, W/ REFLEX TO CULTURE (INFECTION SUSPECTED) - Abnormal; Notable for the following components:   APPearance HAZY (*)    Hgb urine dipstick LARGE (*)    Leukocytes,Ua LARGE (*)    Bacteria, UA FEW (*)    All other components within normal limits  SARS CORONAVIRUS 2 BY RT PCR  URINE CULTURE    EKG None  Radiology No results found.  Procedures Procedures    Medications Ordered in ED Medications  cephALEXin (KEFLEX) capsule 500 mg (has no administration in time range)    ED Course/ Medical Decision Making/ A&P                                 Medical Decision Making Thin adult male with history of A-fib, CAD, diabetes, kidney stones  presents with decreased urine production, concern for urinary retention in the context of possible generalized illness versus bacteremia, sepsis.  Amount and/or Complexity of Data Reviewed Independent Historian: EMS External Data Reviewed: notes. Labs: ordered. Decision-making details documented in ED Course.  Risk Prescription drug management.   2:24 PM On repeat exam patient is awake, alert, after finding half liter in his bladder patient placement of Foley  catheter he notes his symptoms essentially resolved at that point. Labs reviewed, notable for possible urinary tract infection, patient will start ceftriaxone.  No evidence for bacteremia, sepsis, given his improved condition here, patient can follow-up with his urologist.        Final Clinical Impression(s) / ED Diagnoses Final diagnoses:  Acute urinary retention  Lower urinary tract infectious disease    Rx / DC Orders ED Discharge Orders          Ordered    cephALEXin (KEFLEX) 500 MG capsule  2 times daily        05/01/23 1424              Gerhard Munch, MD 05/01/23 1425

## 2023-05-01 NOTE — ED Triage Notes (Signed)
Pt BIBA from home c/o urinary retention. Pt reports last voided yesterday afternoon. Pt had surgery to have kidney stones removed recently. Pt wife concerned for possible covid. A&Ox4  BP 158/82 P 90 T 97.8 CBG 180

## 2023-05-02 DIAGNOSIS — N3021 Other chronic cystitis with hematuria: Secondary | ICD-10-CM | POA: Diagnosis not present

## 2023-05-02 DIAGNOSIS — R338 Other retention of urine: Secondary | ICD-10-CM | POA: Diagnosis not present

## 2023-05-03 LAB — URINE CULTURE: Culture: >=100000 — AB
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
## 2023-05-04 ENCOUNTER — Telehealth (HOSPITAL_BASED_OUTPATIENT_CLINIC_OR_DEPARTMENT_OTHER): Payer: Self-pay | Admitting: *Deleted

## 2023-05-04 NOTE — Telephone Encounter (Signed)
Post ED Visit - Positive Culture Follow-up  Culture report reviewed by antimicrobial stewardship pharmacist: [facility] []  Enzo Bi, Pharm.D. []  Celedonio Miyamoto, Pharm.D., BCPS AQ-ID []  Garvin Fila, Pharm.D., BCPS []  Georgina Pillion, Pharm.D., BCPS []  [place], [address].D., BCPS, AAHIVP []  Estella Husk, Pharm.D., BCPS, AAHIVP []  Lysle Pearl, PharmD, BCPS []  Phillips Climes, PharmD, BCPS []  Agapito Games, PharmD, BCPS []  Verlan Friends, PharmD []  Mervyn Gay, PharmD, BCPS []  Vinnie Level, PharmD  [facility] [x]  Sherral Hammers,  PharmD []  Greer Pickerel, PharmD []  Adalberto Cole, PharmD []  Perlie Gold, Rph []  Lonell Face) Jean Rosenthal, PharmD []  Earl Many, PharmD []  Junita Push, PharmD []  Dorna Leitz, PharmD []  Terrilee Files, PharmD []  Lynann Beaver, PharmD []  Keturah Barre, PharmD []  Loralee Pacas, PharmD []  Bernadene Person, PharmD   Positive urine culture Treated with Cephalexin, organism sensitive to the same and no further patient follow-up is required at this time.  Virl Axe [facility] 05/04/2023, 8:12 AM

## 2023-05-07 ENCOUNTER — Ambulatory Visit: Payer: Medicare Other | Admitting: Family Medicine

## 2023-05-09 ENCOUNTER — Other Ambulatory Visit: Payer: Self-pay | Admitting: Family Medicine

## 2023-05-10 ENCOUNTER — Telehealth: Payer: Self-pay

## 2023-05-10 DIAGNOSIS — N139 Obstructive and reflux uropathy, unspecified: Secondary | ICD-10-CM | POA: Diagnosis not present

## 2023-05-10 DIAGNOSIS — N3021 Other chronic cystitis with hematuria: Secondary | ICD-10-CM | POA: Diagnosis not present

## 2023-05-10 DIAGNOSIS — N401 Enlarged prostate with lower urinary tract symptoms: Secondary | ICD-10-CM | POA: Diagnosis not present

## 2023-05-10 DIAGNOSIS — R338 Other retention of urine: Secondary | ICD-10-CM | POA: Diagnosis not present

## 2023-05-10 NOTE — Telephone Encounter (Signed)
Transition Care Management Unsuccessful Follow-up Telephone Call  Date of discharge and from where:  [facility] 8/20  Attempts:  1st  Reason for unsuccessful TCM follow-up call:  No answer/busy   Lenard Forth [place]  [facility], [facility] Guide, Phone: [phone] Website: [email or web address]

## 2023-05-10 NOTE — Telephone Encounter (Signed)
Transition Care Management Unsuccessful Follow-up Telephone Call  Date of discharge and from where:  [facility] 8/[facility]  Attempts:  2nd Attempt  Reason for unsuccessful TCM follow-up call:  No answer/busy   Lenard Forth [employer]  [facility], [facility] Guide, Phone: [phone] Website: [email or web address]

## 2023-05-11 ENCOUNTER — Ambulatory Visit: Payer: Medicare Other | Admitting: Family Medicine

## 2023-05-11 ENCOUNTER — Encounter: Payer: Self-pay | Admitting: Family Medicine

## 2023-05-14 ENCOUNTER — Other Ambulatory Visit: Payer: Self-pay | Admitting: Family Medicine

## 2023-05-18 DIAGNOSIS — R338 Other retention of urine: Secondary | ICD-10-CM | POA: Diagnosis not present

## 2023-05-18 DIAGNOSIS — N401 Enlarged prostate with lower urinary tract symptoms: Secondary | ICD-10-CM | POA: Diagnosis not present

## 2023-05-23 ENCOUNTER — Other Ambulatory Visit: Payer: Self-pay | Admitting: Family Medicine

## 2023-05-23 ENCOUNTER — Other Ambulatory Visit: Payer: Self-pay | Admitting: Urology

## 2023-05-23 DIAGNOSIS — N401 Enlarged prostate with lower urinary tract symptoms: Secondary | ICD-10-CM

## 2023-05-25 DIAGNOSIS — Z23 Encounter for immunization: Secondary | ICD-10-CM | POA: Diagnosis not present

## 2023-05-29 ENCOUNTER — Encounter: Payer: Self-pay | Admitting: Family Medicine

## 2023-05-29 ENCOUNTER — Ambulatory Visit (INDEPENDENT_AMBULATORY_CARE_PROVIDER_SITE_OTHER): Payer: Medicare Other | Admitting: Family Medicine

## 2023-05-29 VITALS — BP 152/76 | HR 65 | Temp 97.9°F | Ht 73.0 in | Wt 171.6 lb

## 2023-05-29 DIAGNOSIS — E1159 Type 2 diabetes mellitus with other circulatory complications: Secondary | ICD-10-CM | POA: Diagnosis not present

## 2023-05-29 DIAGNOSIS — E785 Hyperlipidemia, unspecified: Secondary | ICD-10-CM

## 2023-05-29 DIAGNOSIS — R413 Other amnesia: Secondary | ICD-10-CM | POA: Diagnosis not present

## 2023-05-29 DIAGNOSIS — I10 Essential (primary) hypertension: Secondary | ICD-10-CM

## 2023-05-29 DIAGNOSIS — I2510 Atherosclerotic heart disease of native coronary artery without angina pectoris: Secondary | ICD-10-CM | POA: Diagnosis not present

## 2023-05-29 LAB — COMPREHENSIVE METABOLIC PANEL
ALT: 9 U/L (ref 0–53)
AST: 14 U/L (ref 0–37)
Albumin: 4 g/dL (ref 3.5–5.2)
Alkaline Phosphatase: 99 U/L (ref 39–117)
BUN: 15 mg/dL (ref 6–23)
CO2: 27 meq/L (ref 19–32)
Calcium: 9.9 mg/dL (ref 8.4–10.5)
Chloride: 106 meq/L (ref 96–112)
Creatinine, Ser: 0.84 mg/dL (ref 0.40–1.50)
GFR: 75.48 mL/min (ref >60.00)
Glucose, Bld: 101 mg/dL — ABNORMAL HIGH (ref 70–99)
Potassium: 4 meq/L (ref 3.5–5.1)
Sodium: 140 meq/L (ref 135–145)
Total Bilirubin: 0.6 mg/dL (ref 0.2–1.2)
Total Protein: 6.9 g/dL (ref 6.0–8.3)

## 2023-05-29 LAB — LIPID PANEL
Cholesterol: 109 mg/dL (ref 0–200)
HDL: 41.6 mg/dL (ref >39.00)
LDL Cholesterol: 49 mg/dL (ref 0–99)
NonHDL: 67
Total CHOL/HDL Ratio: 3
Triglycerides: 91 mg/dL (ref 0.0–149.0)
VLDL: 18.2 mg/dL (ref 0.0–40.0)

## 2023-05-29 LAB — VITAMIN B12: Vitamin B-12: 378 pg/mL (ref 211–911)

## 2023-05-29 LAB — CBC WITH DIFFERENTIAL/PLATELET
Basophils Absolute: 0.1 10*3/uL (ref 0.0–0.1)
Basophils Relative: 1.6 % (ref 0.0–3.0)
Eosinophils Absolute: 0.1 10*3/uL (ref 0.0–0.7)
Eosinophils Relative: 2 % (ref 0.0–5.0)
HCT: 36.6 % — ABNORMAL LOW (ref 39.0–52.0)
Hemoglobin: 12 g/dL — ABNORMAL LOW (ref 13.0–17.0)
Lymphocytes Relative: 21 % (ref 12.0–46.0)
Lymphs Abs: 0.9 10*3/uL (ref 0.7–4.0)
MCHC: 32.8 g/dL (ref 30.0–36.0)
MCV: 93.1 fl (ref 78.0–100.0)
Monocytes Absolute: 0.3 10*3/uL (ref 0.1–1.0)
Monocytes Relative: 7.3 % (ref 3.0–12.0)
Neutro Abs: 2.8 10*3/uL (ref 1.4–7.7)
Neutrophils Relative %: 68.1 % (ref 43.0–77.0)
Platelets: 180 10*3/uL (ref 150.0–400.0)
RBC: 3.93 Mil/uL — ABNORMAL LOW (ref 4.22–5.81)
RDW: 16.6 % — ABNORMAL HIGH (ref 11.5–15.5)
WBC: 4.1 10*3/uL (ref 4.0–10.5)

## 2023-05-29 NOTE — Progress Notes (Signed)
Phone [phone] In person visit   Subjective:   Sean Lane is a 89 y.o. year old very pleasant male patient who presents for/with See problem oriented charting Chief Complaint  Patient presents with   Medical Management of Chronic Issues   Hyperlipidemia   Diabetes    Eye exam scheduled for this Friday.    Past Medical History-  Patient Active Problem List   Diagnosis Date Noted   Memory change 11/09/2022    Priority: High   CAD S/P percutaneous coronary angioplasty 10/26/2016    Priority: High   Type 2 diabetes mellitus with CAD(HCC) 12/14/2012    Priority: High   Atrial flutter (HCC) 10/03/2011    Priority: High   CAD (coronary artery disease) 12/17/2007    Priority: High   SINUS BRADYCARDIA 07/20/2010    Priority: Medium    BPH associated with nocturia 06/08/2009    Priority: Medium    DIZZINESS 12/15/2008    Priority: Medium    Hyperlipidemia 04/02/2007    Priority: Medium    Essential hypertension 04/02/2007    Priority: Medium    Thrombocytopenia (HCC) 12/04/2016    Priority: Low   Diverticulitis of colon 11/22/2015    Priority: Low   Nephrolithiasis 05/07/2014    Priority: Low   Gout attack 12/14/2012    Priority: Low   Hematuria 03/28/2012    Priority: Low   RBBB 12/28/2008    Priority: Low   GERD 12/15/2008    Priority: Low   LOSS, CONDUCTIVE HEARING, COMBINED TYPE 04/16/2007    Priority: Low   Allergic rhinitis 04/02/2007    Priority: Low   OA (osteoarthritis) of knee 04/02/2007    Priority: Low   Bilateral wrist pain 05/18/2021    Priority: 1.   Arthritis of carpometacarpal [facility]) joint of both thumbs 05/03/2021    Priority: 1.   Left rotator cuff tear 12/09/2020    Priority: 1.   Degenerative disc disease, lumbar 07/21/2020    Priority: 1.   Nonallopathic lesion of sacral region 12/17/2019    Priority: 1.   Nonallopathic lesion of lumbosacral region 12/17/2019    Priority: 1.   Spasm of left piriformis muscle 06/27/2019     Priority: 1.   Degenerative disc disease, cervical 05/28/2019    Priority: 1.   Nonallopathic lesion of thoracic region 05/28/2019    Priority: 1.   Sensorineural hearing loss (SNHL) of both ears 12/01/2022   Urine retention 05/12/2022    Medications- reviewed and updated Current Outpatient Medications  Medication Sig Dispense Refill   ACCU-CHEK GUIDE test strip USE TO TEST ONCE DAILY. DX: E11.9 100 strip 0   Accu-Chek Softclix Lancets lancets USE TO CHECK BLOOD SUGAR ONCE DAILY. Dx: E11.9 100 each 0   acetaminophen (TYLENOL) 650 MG CR tablet Take 1,300 mg by mouth 3 (three) times daily.     aspirin EC 81 MG tablet Take 81 mg by mouth daily.     Blood Glucose Monitoring Suppl (ACCU-CHEK GUIDE ME) w/Device KIT Use to test blood sugars once daily. Dx: E11.9. Last meter supplied over 5 years ago- please give patient cash pay price if this is not covered or send [place] the script you need and we will sign. 1 kit 0   calcium carbonate (TUMS - DOSED IN MG ELEMENTAL CALCIUM) 500 MG chewable tablet Chew 2 tablets (400 mg of elemental calcium total) by mouth daily as needed for indigestion or heartburn.     cholecalciferol (VITAMIN D3) 25 MCG (1000  UNIT) tablet Take 2,000 Units by mouth daily.     clobetasol (OLUX) 0.05 % topical foam Apply topically daily. (Patient taking differently: Apply 1 Application topically daily as needed (itchy scalp).) 50 g 3   DULoxetine (CYMBALTA) 20 MG capsule TAKE 1 CAPSULE DAILY 90 capsule 1   dutasteride (AVODART) 0.5 MG capsule TAKE 1 CAPSULE DAILY 90 capsule 3   Emollient (GOLD BOND DIABETICS DRY SKIN) CREA Apply 1 Application topically daily as needed (dry skin).     furosemide (LASIX) 20 MG tablet Take 1 tablet (20 mg total) by mouth as needed. 90 tablet 3   hydrALAZINE (APRESOLINE) 10 MG tablet Take 1 tablet (10 mg total) by mouth as needed (for SBP over 180). 30 tablet 3   lisinopril (ZESTRIL) 40 MG tablet Take 1 tablet (40 mg total) by mouth daily. 90 tablet 3    Naphazoline-Pheniramine (OPCON-A OP) Apply 1 drop to eye daily as needed (allergies).     nitroGLYCERIN (NITROSTAT) 0.4 MG SL tablet Place 1 tablet (0.4 mg total) under the tongue every 5 (five) minutes as needed for chest pain (for chest pain). Use up to 3 dosages, if no relief call 911. 75 tablet 1   oxymetazoline (AFRIN) 0.05 % nasal spray Place 1 spray into both nostrils 2 (two) times daily as needed for congestion.     rosuvastatin (CRESTOR) 10 MG tablet TAKE 1 TABLET DAILY 90 tablet 3   tamsulosin (FLOMAX) 0.4 MG CAPS capsule TAKE 1 CAPSULE DAILY 90 capsule 1   No current facility-administered medications for this visit.     Objective:  BP (!) 152/76   Pulse 65   Temp 97.9 F (36.6 C)   Ht 6\' 1"  (1.854 m)   Wt 171 lb 9.6 oz (77.8 kg)   SpO2 95%   BMI 22.64 kg/m  Gen: NAD, resting comfortably CV: RRR no murmurs rubs or gallops Lungs: CTAB no crackles, wheeze, rhonchi Ext: trace to 1+ edema Skin: warm, dry     Assessment and Plan   # Memory loss-mild MMSE reduction 28/30 at last visit-audiology referral for hearing loss last visit as can have associations with memory loss that he reports had been seen but this got derailed by bladder stone issues- he plans to call back today  -mmse was stressful for him last time so wants to hold off for now and likely long term unless worsening symptoms -does want another B12 check  # Bladder obstruction with BPH-on dutasteride and tamsulosin last visit after requiring ED visit to change out Foley catheter prior to last visit.  Was later able to come off of catheter but unfortunately another visit for urinary retention 05/01/2023 to the emergency department-half a liter of fluid in his bladder at that time and symptoms resolved with removal  -In mid July also had large bladder stones noted on CT imaging which was likely causing the prior hematuria as well as difficulty with self-catheterization, retention and UTI issues.  He ended up with  cystolitholopaxy under spinal anesthesia with Dr. Marlou Porch -apparently considering prostate artery embolization for BPH - not doing self cath at night though advised by Dr. Marlou Porch- challenging for him - has had improved strength since procedure. No more blood in urine.   # CAD-follows with Dr. Eden Emms  #hyperlipidemia S: Medication: Not on aspirin as on Xarelto 20 mg, rosuvastatin 10 mg daily -No chest pain.  Occasional shortness of breath with taking the garbage out but no worseing  Lab Results  Component Value Date  CHOL 112 05/11/2022   HDL 36.60 (L) 05/11/2022   LDLCALC 45 05/11/2022   LDLDIRECT 54.0 06/05/2017   TRIG 149.0 05/11/2022   CHOLHDL 3 05/11/2022   A/P: CAD largely asymptomatic-continue current medication.  Lipids have been at goal but update lipid panel-likely continue current medications  # Diabetes S: Medication: Diet controlled Lab Results  Component Value Date   HGBA1C 6.3 (H) 03/16/2023   HGBA1C 6.8 (H) 09/21/2022   HGBA1C 6.6 (H) 05/11/2022   A/P: Well-controlled on most recent check-continue diet control   #hypertension S: medication: Lisinopril 40 mg, hydralazine 10 mg of blood pressure gets over 180, Lasix 20 mg daily as needed for edema-lately almost never  BP Readings from Last 3 Encounters:  05/29/23 (!) 152/76  05/01/23 138/62  03/28/23 (!) 143/66  A/P: blood pressure slightly elevated today even on repeat- they are going of the do some home checks and update me by Monday- hold off on medication(s) changes for now     # Atrial fibrillation S: Rate controlled without medication Anticoagulated with Xarelto 20 mg A/P: appropriately anticoagulated and rate controlled- continue current medicine    Recommended follow up: No follow-ups on file. Future Appointments  Date Time Provider Department Center  05/31/2023  3:15 PM Helane Gunther, DPM [facility]  06/06/2023 11:00 AM [facility] [place] [facility] 1 [facility]  06/14/2023  2:45 PM Judi Saa, DO [facility] None  09/03/2023  2:15 PM Wendall Stade, MD [facility]  04/29/2024 10:00 AM [facility] ANNUAL WELLNESS VISIT 1 [facility]   Lab/Order associations: NOT fasting   ICD-10-CM   1. Type 2 diabetes mellitus with other circulatory complication, without long-term current use of insulin (HCC)  E11.59     2. Essential hypertension  I10     3. Hyperlipidemia, unspecified hyperlipidemia type  E78.5 Comprehensive metabolic panel    CBC with Differential/Platelet    Lipid panel    4. Coronary artery disease involving native coronary artery of native heart without angina pectoris  I25.10      No orders of the defined types were placed in this encounter.  Return precautions advised.  Tana Conch, MD

## 2023-05-29 NOTE — Patient Instructions (Addendum)
Let us know when you get your flu shot next week.  Have them send [place] copy of eye exam  blood pressure slightly elevated today even on repeat- they are going of the do some home checks and update me by Monday- hold off on medication(s) changes for now    Please stop by lab before you go If you have mychart- we will send your results within 3 business days of [place] receiving them.  If you do not have mychart- we will call you about results within 5 business days of [place] receiving them.  *please also note that you will see labs on mychart as soon as they post. I will later go in and write notes on them- will say "notes from Dr. Durene Cal"   Recommended follow up: Return in about 6 months (around 11/26/2023) for followup or sooner if needed.Schedule b4 you leave.

## 2023-05-29 NOTE — Addendum Note (Signed)
Addended by: Shelva Majestic on: 05/29/2023 05:00 PM   Modules accepted: Level of Service

## 2023-05-31 ENCOUNTER — Ambulatory Visit (INDEPENDENT_AMBULATORY_CARE_PROVIDER_SITE_OTHER): Payer: Medicare Other | Admitting: Podiatry

## 2023-05-31 ENCOUNTER — Encounter: Payer: Self-pay | Admitting: Podiatry

## 2023-05-31 DIAGNOSIS — B351 Tinea unguium: Secondary | ICD-10-CM | POA: Diagnosis not present

## 2023-05-31 DIAGNOSIS — M79675 Pain in left toe(s): Secondary | ICD-10-CM | POA: Diagnosis not present

## 2023-05-31 DIAGNOSIS — M79674 Pain in right toe(s): Secondary | ICD-10-CM

## 2023-05-31 DIAGNOSIS — E1151 Type 2 diabetes mellitus with diabetic peripheral angiopathy without gangrene: Secondary | ICD-10-CM

## 2023-05-31 NOTE — Progress Notes (Signed)
This patient returns to my office for at risk foot care.  This patient requires this care by a professional since this patient will be at risk due to having type 2 diabetes and coagulation defect.  Patient is taking xarelto.  This patient presents to the office with his wife.  This patient is unable to cut nails himself since the patient cannot reach his nails.These nails are painful walking and wearing shoes.  This patient presents for at risk foot care today.  General Appearance  Alert, conversant and in no acute stress.  Vascular  Dorsalis pedis   pulses are  weakly palpable  bilaterally. Posterior tibial pulses are absent  Bilaterally. Capillary return is within normal limits  bilaterally. Temperature is within normal limits  bilaterally.  Neurologic  Senn-Weinstein monofilament wire test diminished   bilaterally. Muscle power within normal limits bilaterally.  Nails Thick disfigured discolored nails with subungual debris  from hallux to fifth toes bilaterally. Hammer toes 2-4  B/L.  Orthopedic  No limitations of motion  feet .  No crepitus or effusions noted.  No bony pathology or digital deformities noted.  Skin  normotropic skin with no porokeratosis noted bilaterally.  No signs of infections or ulcers noted.     Onychomycosis  Pain in right toes  Pain in left toes  Consent was obtained for treatment procedures.   Mechanical debridement of nails 1-5  bilaterally performed with a nail nipper.  Filed with dremel without incident.    Return office visit   10 weeks                 Told patient to return for periodic foot care and evaluation due to potential at risk complications.   Helane Gunther DPM

## 2023-06-01 ENCOUNTER — Encounter: Payer: Self-pay | Admitting: Family Medicine

## 2023-06-01 DIAGNOSIS — Z961 Presence of intraocular lens: Secondary | ICD-10-CM | POA: Diagnosis not present

## 2023-06-01 DIAGNOSIS — E119 Type 2 diabetes mellitus without complications: Secondary | ICD-10-CM | POA: Diagnosis not present

## 2023-06-01 LAB — HM DIABETES EYE EXAM

## 2023-06-04 MED ORDER — AMLODIPINE BESYLATE 2.5 MG PO TABS: 2.5000 mg | ORAL_TABLET | Freq: Every day | ORAL | 5 refills | Status: DC
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
## 2023-06-06 ENCOUNTER — Encounter: Payer: Self-pay | Admitting: Internal Medicine

## 2023-06-06 ENCOUNTER — Other Ambulatory Visit: Payer: Self-pay | Admitting: Interventional Radiology

## 2023-06-06 ENCOUNTER — Ambulatory Visit
Admission: RE | Admit: 2023-06-06 | Discharge: 2023-06-06 | Disposition: A | Discharge status: Home / Self Care | Payer: Medicare Other | Source: Ambulatory Visit | Attending: Urology | Admitting: Urology

## 2023-06-06 ENCOUNTER — Ambulatory Visit: Payer: Medicare Other | Admitting: Internal Medicine

## 2023-06-06 VITALS — BP 140/76 | HR 69 | Temp 98.4°F | Resp 18 | Ht 73.0 in | Wt 170.2 lb

## 2023-06-06 DIAGNOSIS — Z87448 Personal history of other diseases of urinary system: Secondary | ICD-10-CM | POA: Diagnosis not present

## 2023-06-06 DIAGNOSIS — N138 Other obstructive and reflux uropathy: Secondary | ICD-10-CM

## 2023-06-06 DIAGNOSIS — I10 Essential (primary) hypertension: Secondary | ICD-10-CM | POA: Diagnosis not present

## 2023-06-06 DIAGNOSIS — N401 Enlarged prostate with lower urinary tract symptoms: Secondary | ICD-10-CM

## 2023-06-06 DIAGNOSIS — T50901A Poisoning by unspecified drugs, medicaments and biological substances, accidental (unintentional), initial encounter: Secondary | ICD-10-CM

## 2023-06-06 HISTORY — PX: IR RADIOLOGIST EVAL & MGMT: IMG5224

## 2023-06-06 NOTE — Patient Instructions (Signed)
VISIT SUMMARY:  During your recent visit, we discussed your accidental medication overdose, fluctuating blood pressure, and history of bladder stones. You had mistakenly taken double doses of your medications for two days, which led to a day of confusion and high blood pressure. We found no evidence of internal bleeding or neurological deficits. Your history of bladder stones seems to be resolved.  YOUR PLAN:  -ACCIDENTAL MEDICATION OVERDOSE: You accidentally took double doses of your medications, leading to confusion and lightheadedness. We will resume your regular medication regimen and monitor for any signs of internal bleeding due to the double dose of Xarelto, a blood thinner. We offered to consult with a clinical pharmacist to prevent future medication errors by arranging pill packs; you deferred for now but please reach out if you change your mind.  -HYPERTENSION: Your blood pressure has been fluctuating, with readings as high as 191/100 but you have a blood pressure pill for that situation and its reasonably controlled today. We will continue your current blood pressure medications and monitor your blood pressure regularly. Hypertension means high blood pressure, which can lead to serious health problems if not managed.  See Dr. Durene Cal soon for adjustment.  -BLADDER STONES: You have a history of bladder stones, which are hard deposits of minerals that form in your bladder. These have been resolved, and you are currently on Flomax, a medication to help with urination. We will consider discussing with Dr. Durene Cal the possibility of discontinuing Flomax, given the resolution of bladder stones and their associated symptom(s).  INSTRUCTIONS:  Please continue to take your medications as prescribed and monitor your blood pressure regularly. If you notice any unusual symptoms, such as severe headache, dizziness, or blood in your urine or stool, please contact us immediately. We will also arrange a  consultation with a clinical pharmacist to help manage your medications and prevent future errors.

## 2023-06-06 NOTE — Progress Notes (Signed)
[facility]: [phone]   -- Medical Office Visit --  Patient:  Sean Lane      Age: 89 y.o.       Sex:  male  Date:   06/06/2023 Patient Care Team: Shelva Majestic, MD as PCP - General (Family Medicine) Wendall Stade, MD as PCP - Cardiology (Cardiology) Ollen Gross, MD as Consulting Physician (Orthopedic Surgery) Sinda Du, MD as Consulting Physician (Ophthalmology) Judi Saa, DO as Consulting Physician (Family Medicine) Helane Gunther, DPM as Consulting Physician (Podiatry) [facility] as Consulting Physician (Audiology) Suzi Roots as Physician Assistant (Dermatology) Erroll Luna, [facility] (Inactive) (Pharmacist) Janalyn Harder, MD (Inactive) as Consulting Physician (Dermatology) Today's Healthcare Provider: Lula Olszewski, MD      Assessment & Plan Accidental drug overdose, initial encounter He inadvertently doubled doses of Cymbalta 20mg , Avodart 0.5mg , Hydralazine 10mg , Lisinopril 40mg , Crestor 10mg , Flomax, and Xarelto, leading to confusion and lightheadedness, likely from hypotension due to the doubled antihypertensive medications. There is no evidence of internal bleeding or neurological deficits. We will resume his regular medication regimen, check for signs of internal bleeding from the doubled dose of Xarelto, and consider a consultation with a clinical pharmacist to prevent future medication errors. Hypertension, unspecified type His blood pressure has been fluctuating, with readings as high as 191/100. He has Hydralazine to take if his BP exceeds 180. We will continue his current antihypertensive regimen and monitor his blood pressure regularly. History of bladder stone He has a history of bladder stones, which is now resolved, and he is currently on Flomax. We will consider discussing with Dr. Durene Cal the possibility of discontinuing Flomax, given the resolution of bladder stones.   There are no diagnoses  linked to this encounter. Recommended follow-up: Dr. Durene Cal sooner than 11/2023 Future Appointments  Date Time Provider Department Center  06/06/2023 11:00 AM [facility] 1 [facility]  06/14/2023  2:45 PM Judi Saa, DO [facility] None  08/16/2023  3:30 PM Helane Gunther, DPM [facility]  09/03/2023  2:15 PM Wendall Stade, MD [facility]  11/27/2023  2:20 PM Shelva Majestic, MD [facility]  04/29/2024 10:00 AM [facility] ANNUAL WELLNESS VISIT 1 [facility]            Subjective   89 y.o. male who has Hyperlipidemia; LOSS, CONDUCTIVE HEARING, COMBINED TYPE; Essential hypertension; CAD (coronary artery disease); RBBB; SINUS BRADYCARDIA; Allergic rhinitis; GERD; BPH associated with nocturia; OA (osteoarthritis) of knee; DIZZINESS; Atrial flutter (HCC); Hematuria; Gout attack; Type 2 diabetes mellitus with CAD(HCC); Nephrolithiasis; Diverticulitis of colon; CAD S/P percutaneous coronary angioplasty; Degenerative disc disease, cervical; Nonallopathic lesion of thoracic region; Spasm of left piriformis muscle; Nonallopathic lesion of sacral region; Nonallopathic lesion of lumbosacral region; Degenerative disc disease, lumbar; Left rotator cuff tear; Arthritis of carpometacarpal (CMC) joint of both thumbs; Bilateral wrist pain; Memory change; Sensorineural hearing loss (SNHL) of both ears; and Urine retention on their problem list. His reasons/main concerns/chief complaints for today's office visit are Memory Issues (Became confused Sunday night after mistakenly taken morning and evening medications for Sunday and Monday//)   ------------------------------------------------------------------------------------------------------------------------ AI-Extracted: Discussed the use of AI scribe software for clinical note transcription with the patient, who gave verbal consent to proceed.  History of Present Illness   The patient, a 89 year old under the care of Dr.  Durene Cal, experienced an episode of confusion and unintentional medication overdose. The patient's spouse, who assists with medication management, reported that the patient mistakenly took both the  morning and evening doses for two consecutive days, effectively doubling the medication intake. The medications included Cymbalta 20 mg, Avodart 0.5 mg, Lasix 20 mg, Hydralazine 10 mg, Lisinopril 40 mg, Crestor 10 mg, Flomax, and a high dose of Xarelto.  Following the incident, the patient experienced a day of significant confusion and difficulty keeping things straight, which was unusual for the patient, a detail-oriented individual by profession. The patient's blood pressure fluctuated significantly, ranging from the 140s to as high as 191. The patient's spouse administered additional Hydralazine on two occasions when the blood pressure exceeded 180.  The patient also has a history of bladder stones, which have been removed, and has been battling bladder issues for almost a year. The patient reported no current abdominal swelling or pain, no blood in the stool, and no headache. The patient's spouse believes he has resolved the issue that led to the medication overdose.      He has a past medical history of Allergy, Atrial flutter (HCC), Atypical mole (02/11/2020), BRONCHITIS, ACUTE WITH MILD BRONCHOSPASM (11/22/2009), Cancer (HCC), Conductive hearing loss, external ear, Coronary atherosclerosis of unspecified type of vessel, native or graft, Diabetes mellitus without complication (HCC), Diverticulosis of colon (without mention of hemorrhage), Dizziness and giddiness, Dysrhythmia, Family history of ischemic heart disease, Gout attack (12/14/2012), Headache(784.0), Hemorrhoids, HERPES ZOSTER (01/25/2009), History of kidney stones, Hyperlipidemia, Hypertension, Osteoarthrosis, unspecified whether generalized or localized, hand, Osteoarthrosis, unspecified whether generalized or localized, unspecified site, PEPTIC  ULCER DISEASE, HX OF (12/15/2008), Personal history of urinary calculi, Scab, and Ulcer.  Problem list overviews that were updated at today's visit: No problems updated. Current Outpatient Medications on File Prior to Visit  Medication Sig   ACCU-CHEK GUIDE test strip USE TO TEST ONCE DAILY. DX: E11.9   Accu-Chek Softclix Lancets lancets USE TO CHECK BLOOD SUGAR ONCE DAILY. Dx: E11.9   acetaminophen (TYLENOL) 650 MG CR tablet Take 1,300 mg by mouth 3 (three) times daily.   amLODipine (NORVASC) 2.5 MG tablet Take 1 tablet (2.5 mg total) by mouth daily.   aspirin EC 81 MG tablet Take 81 mg by mouth daily.   Blood Glucose Monitoring Suppl (ACCU-CHEK GUIDE ME) w/Device KIT Use to test blood sugars once daily. Dx: E11.9. Last meter supplied over 5 years ago- please give patient cash pay price if this is not covered or send [place] the script you need and we will sign.   calcium carbonate (TUMS - DOSED IN MG ELEMENTAL CALCIUM) 500 MG chewable tablet Chew 2 tablets (400 mg of elemental calcium total) by mouth daily as needed for indigestion or heartburn.   cholecalciferol (VITAMIN D3) 25 MCG (1000 UNIT) tablet Take 2,000 Units by mouth daily.   clobetasol (OLUX) 0.05 % topical foam Apply topically daily. (Patient taking differently: Apply 1 Application topically daily as needed (itchy scalp).)   DULoxetine (CYMBALTA) 20 MG capsule TAKE 1 CAPSULE DAILY   dutasteride (AVODART) 0.5 MG capsule TAKE 1 CAPSULE DAILY   Emollient (GOLD BOND DIABETICS DRY SKIN) CREA Apply 1 Application topically daily as needed (dry skin).   furosemide (LASIX) 20 MG tablet Take 1 tablet (20 mg total) by mouth as needed.   hydrALAZINE (APRESOLINE) 10 MG tablet Take 1 tablet (10 mg total) by mouth as needed (for SBP over 180).   lisinopril (ZESTRIL) 40 MG tablet Take 1 tablet (40 mg total) by mouth daily.   Naphazoline-Pheniramine (OPCON-A OP) Apply 1 drop to eye daily as needed (allergies).   nitrofurantoin (MACRODANTIN) 50 MG  capsule  nitroGLYCERIN (NITROSTAT) 0.4 MG SL tablet Place 1 tablet (0.4 mg total) under the tongue every 5 (five) minutes as needed for chest pain (for chest pain). Use up to 3 dosages, if no relief call 911.   oxymetazoline (AFRIN) 0.05 % nasal spray Place 1 spray into both nostrils 2 (two) times daily as needed for congestion.   rosuvastatin (CRESTOR) 10 MG tablet TAKE 1 TABLET DAILY   tamsulosin (FLOMAX) 0.4 MG CAPS capsule TAKE 1 CAPSULE DAILY   XARELTO 20 MG TABS tablet Take 20 mg by mouth daily.   No current facility-administered medications on file prior to visit.  There are no discontinued medications.   Objective   Physical Exam  BP (!) 149/76   Pulse 69   Temp 98.4 F (36.9 C) (Temporal)   Resp 18   Ht 6\' 1"  (1.854 m)   Wt 170 lb 4 oz (77.2 kg)   SpO2 98%   BMI 22.46 kg/m  Wt Readings from Last 10 Encounters:  06/06/23 170 lb 4 oz (77.2 kg)  05/29/23 171 lb 9.6 oz (77.8 kg)  05/01/23 163 lb (73.9 kg)  04/26/23 160 lb (72.6 kg)  03/28/23 164 lb (74.4 kg)  03/16/23 164 lb (74.4 kg)  02/15/23 165 lb 5.5 oz (75 kg)  02/15/23 166 lb 6.4 oz (75.5 kg)  01/31/23 171 lb 15.3 oz (78 kg)  01/04/23 173 lb (78.5 kg)   Vital signs reviewed.  Nursing notes reviewed. Weight trend reviewed. Abnormalities and Problem-Specific physical exam findings:  full cn testing negative except some nystagmus on eomi.  Can walk on own.  No abdominal swelling or signs & symptoms of internal bleeding from doubling Xarelto.  General Appearance:  No acute distress appreciable.   Well-groomed, healthy-appearing male.  Well proportioned with no abnormal fat distribution.  Good muscle tone. Pulmonary:  Normal work of breathing at rest, no respiratory distress apparent. SpO2: 98 %  Musculoskeletal: All extremities are intact.  Neurological:  Awake, alert, oriented, and engaged.  No obvious focal neurological deficits or cognitive impairments.  Sensorium seems unclouded.   Speech is clear and coherent  with logical content. Psychiatric:  Appropriate mood, pleasant and cooperative demeanor, thoughtful and engaged during the exam  Results            No results found for any visits on 06/06/23.  Scanned Document on 06/04/2023  Component Date Value   HM Diabetic Eye Exam 06/01/2023 No Retinopathy   Office Visit on 05/29/2023  Component Date Value   Sodium 05/29/2023 140    Potassium 05/29/2023 4.0    Chloride 05/29/2023 106    CO2 05/29/2023 27    Glucose, Bld 05/29/2023 101 (H)    BUN 05/29/2023 15    Creatinine, Ser 05/29/2023 0.84    Total Bilirubin 05/29/2023 0.6    Alkaline Phosphatase 05/29/2023 99    AST 05/29/2023 14    ALT 05/29/2023 9    Total Protein 05/29/2023 6.9    Albumin 05/29/2023 4.0    GFR 05/29/2023 75.48    Calcium 05/29/2023 9.9    WBC 05/29/2023 4.1    RBC 05/29/2023 3.93 (L)    Hemoglobin 05/29/2023 12.0 (L)    HCT 05/29/2023 36.6 (L)    MCV 05/29/2023 93.1    MCHC 05/29/2023 32.8    RDW 05/29/2023 16.6 (H)    Platelets 05/29/2023 180.0    Neutrophils Relative % 05/29/2023 68.1    Lymphocytes Relative 05/29/2023 21.0    Monocytes Relative 05/29/2023 7.3  Eosinophils Relative 05/29/2023 2.0    Basophils Relative 05/29/2023 1.6    Neutro Abs 05/29/2023 2.8    Lymphs Abs 05/29/2023 0.9    Monocytes Absolute 05/29/2023 0.3    Eosinophils Absolute 05/29/2023 0.1    Basophils Absolute 05/29/2023 0.1    Cholesterol 05/29/2023 109    Triglycerides 05/29/2023 91.0    HDL 05/29/2023 41.60    VLDL 05/29/2023 18.2    LDL Cholesterol 05/29/2023 49    Total CHOL/HDL Ratio 05/29/2023 3    NonHDL 05/29/2023 67.00    Vitamin B-12 05/29/2023 378   Admission on 05/01/2023, Discharged on 05/01/2023  Component Date Value   Sodium 05/01/2023 133 (L)    Potassium 05/01/2023 3.3 (L)    Chloride 05/01/2023 100    CO2 05/01/2023 22    Glucose, Bld 05/01/2023 169 (H)    BUN 05/01/2023 16    Creatinine, Ser 05/01/2023 0.95    Calcium 05/01/2023 9.5     Total Protein 05/01/2023 6.9    Albumin 05/01/2023 3.8    AST 05/01/2023 24    ALT 05/01/2023 23    Alkaline Phosphatase 05/01/2023 98    Total Bilirubin 05/01/2023 1.2    GFR, Estimated 05/01/2023 >60    Anion gap 05/01/2023 11    WBC 05/01/2023 7.7    RBC 05/01/2023 3.99 (L)    Hemoglobin 05/01/2023 12.0 (L)    HCT 05/01/2023 37.4 (L)    MCV 05/01/2023 93.7    MCH 05/01/2023 30.1    MCHC 05/01/2023 32.1    RDW 05/01/2023 16.3 (H)    Platelets 05/01/2023 174    nRBC 05/01/2023 0.0    Neutrophils Relative % 05/01/2023 85    Neutro Abs 05/01/2023 6.6    Lymphocytes Relative 05/01/2023 7    Lymphs Abs 05/01/2023 0.5 (L)    Monocytes Relative 05/01/2023 7    Monocytes Absolute 05/01/2023 0.6    Eosinophils Relative 05/01/2023 0    Eosinophils Absolute 05/01/2023 0.0    Basophils Relative 05/01/2023 1    Basophils Absolute 05/01/2023 0.0    Immature Granulocytes 05/01/2023 0    Abs Immature Granulocytes 05/01/2023 0.03    Specimen Source 05/01/2023 URINE, CATHETERIZED    Color, Urine 05/01/2023 YELLOW    APPearance 05/01/2023 HAZY (A)    Specific Gravity, Urine 05/01/2023 1.008    pH 05/01/2023 5.0    Glucose, UA 05/01/2023 NEGATIVE    Hgb urine dipstick 05/01/2023 LARGE (A)    Bilirubin Urine 05/01/2023 NEGATIVE    Ketones, ur 05/01/2023 NEGATIVE    Protein, ur 05/01/2023 NEGATIVE    Nitrite 05/01/2023 NEGATIVE    Leukocytes,Ua 05/01/2023 LARGE (A)    RBC / HPF 05/01/2023 21-50    WBC, UA 05/01/2023 >50    Bacteria, UA 05/01/2023 FEW (A)    Squamous Epithelial / HPF 05/01/2023 0-5    Mucus 05/01/2023 PRESENT    SARS Coronavirus 2 by RT* 05/01/2023 NEGATIVE    Specimen Description 05/01/2023                     Value:URINE, RANDOM Performed at [facility], [address], [place], [place] [address]    Special Requests 05/01/2023                     Value:NONE Reflexed from [id number] Performed at [facility], [address],  [place], [place] [address]    Culture 05/01/2023 >=100,000 COLONIES/mL ESCHERICHIA COLI (A)    Report  Status 05/01/2023 05/03/2023 FINAL    Organism ID, Bacteria 05/01/2023 ESCHERICHIA COLI (A)   Admission on 03/28/2023, Discharged on 03/28/2023  Component Date Value   Glucose-Capillary 03/28/2023 129 (H)    Source Calculi 03/28/2023 Comment    Color Calculi 03/28/2023 Tan    Size Calculi 03/28/2023 7x6    Weight Calculi 03/28/2023 823    Composition Calculi 03/28/2023 Comment    Calcium Oxalate Monohydr* 03/28/2023 10    Carbonate Apatite 03/28/2023 50    Mg NH4 PO4 (Struvite) 03/28/2023 40    Comment Calculi 1 03/28/2023 Comment    Comment Calculi 2 03/28/2023 Comment    Photo Calculi 03/28/2023 Comment    Comment Calculi 3 03/28/2023 Comment    Please Note: 03/28/2023 Comment    DISCLAIMER: 03/28/2023 Comment    Glucose-Capillary 03/28/2023 120 (H)   Hospital Outpatient Visit on 03/16/2023  Component Date Value   Hgb A1c MFr Bld 03/16/2023 6.3 (H)    Mean Plasma Glucose 03/16/2023 134.11    Sodium 03/16/2023 139    Potassium 03/16/2023 4.7    Chloride 03/16/2023 107    CO2 03/16/2023 25    Glucose, Bld 03/16/2023 128 (H)    BUN 03/16/2023 23    Creatinine, Ser 03/16/2023 0.92    Calcium 03/16/2023 10.1    GFR, Estimated 03/16/2023 >60    Anion gap 03/16/2023 7    WBC 03/16/2023 5.5    RBC 03/16/2023 4.18 (L)    Hemoglobin 03/16/2023 12.1 (L)    HCT 03/16/2023 38.9 (L)    MCV 03/16/2023 93.1    MCH 03/16/2023 28.9    MCHC 03/16/2023 31.1    RDW 03/16/2023 18.1 (H)    Platelets 03/16/2023 211    nRBC 03/16/2023 0.0    Glucose-Capillary 03/16/2023 130 (H)   Admission on 02/10/2023, Discharged on 02/10/2023  Component Date Value   Color, Urine 02/10/2023 YELLOW    APPearance 02/10/2023 TURBID (A)    Specific Gravity, Urine 02/10/2023 1.018    pH 02/10/2023 9.0 (H)    Glucose, UA 02/10/2023 NEGATIVE    Hgb urine dipstick 02/10/2023 NEGATIVE    Bilirubin Urine  02/10/2023 SMALL (A)    Ketones, ur 02/10/2023 NEGATIVE    Protein, ur 02/10/2023 >=300 (A)    Nitrite 02/10/2023 NEGATIVE    Leukocytes,Ua 02/10/2023 MODERATE (A)    RBC / HPF 02/10/2023 11-20    WBC, UA 02/10/2023 21-50    Bacteria, UA 02/10/2023 FEW (A)    Squamous Epithelial / HPF 02/10/2023 11-20    Mucus 02/10/2023 PRESENT    Specimen Description 02/10/2023                     Value:URINE, CATHETERIZED Performed at [facility], [address] [place], [place] [address]    Special Requests 02/10/2023                     Value:NONE Performed at [facility], [address], [place], [place] [address]    Culture 02/10/2023 >=100,000 COLONIES/mL PROTEUS MIRABILIS (A)    Report Status 02/10/2023 02/13/2023 FINAL    Organism ID, Bacteria 02/10/2023 PROTEUS MIRABILIS (A)   Admission on 01/31/2023, Discharged on 01/31/2023  Component Date Value   WBC 01/31/2023 5.1    RBC 01/31/2023 3.98 (L)    Hemoglobin 01/31/2023 11.3 (L)    HCT 01/31/2023 35.6 (L)    MCV 01/31/2023 89.4    MCH 01/31/2023 28.4    MCHC 01/31/2023  31.7    RDW 01/31/2023 16.7 (H)    Platelets 01/31/2023 148 (L)    nRBC 01/31/2023 0.0    Neutrophils Relative % 01/31/2023 77    Neutro Abs 01/31/2023 3.9    Lymphocytes Relative 01/31/2023 13    Lymphs Abs 01/31/2023 0.6 (L)    Monocytes Relative 01/31/2023 8    Monocytes Absolute 01/31/2023 0.4    Eosinophils Relative 01/31/2023 1    Eosinophils Absolute 01/31/2023 0.0    Basophils Relative 01/31/2023 1    Basophils Absolute 01/31/2023 0.1    Immature Granulocytes 01/31/2023 0    Abs Immature Granulocytes 01/31/2023 0.01    Sodium 01/31/2023 137    Potassium 01/31/2023 3.2 (L)    Chloride 01/31/2023 105    CO2 01/31/2023 23    Glucose, Bld 01/31/2023 138 (H)    BUN 01/31/2023 16    Creatinine, Ser 01/31/2023 0.82    Calcium 01/31/2023 9.4    GFR, Estimated 01/31/2023 >60    Anion gap 01/31/2023 9    Specimen  Source 01/31/2023 URINE, CLEAN CATCH    Color, Urine 01/31/2023 YELLOW    APPearance 01/31/2023 CLEAR    Specific Gravity, Urine 01/31/2023 1.009    pH 01/31/2023 6.0    Glucose, UA 01/31/2023 NEGATIVE    Hgb urine dipstick 01/31/2023 LARGE (A)    Bilirubin Urine 01/31/2023 NEGATIVE    Ketones, ur 01/31/2023 5 (A)    Protein, ur 01/31/2023 NEGATIVE    Nitrite 01/31/2023 NEGATIVE    Leukocytes,Ua 01/31/2023 LARGE (A)    RBC / HPF 01/31/2023 >50    WBC, UA 01/31/2023 >50    Bacteria, UA 01/31/2023 MANY (A)    Squamous Epithelial / HPF 01/31/2023 0-5    Specimen Description 01/31/2023                     Value:IN/OUT CATH URINE Performed at [facility], [address], [place], [place] [address]    Special Requests 01/31/2023                     Value:NONE Reflexed from [id number] Performed at [facility], [address] [place], [place] [address]    Culture 01/31/2023  (A)                    Value:>=100,000 COLONIES/mL ESCHERICHIA COLI 10,000 COLONIES/mL ENTEROCOCCUS FAECALIS    Report Status 01/31/2023 02/04/2023 FINAL    Organism ID, Bacteria 01/31/2023 ESCHERICHIA COLI (A)    Organism ID, Bacteria 01/31/2023 ENTEROCOCCUS FAECALIS (A)   Admission on 09/30/2022, Discharged on 09/30/2022  Component Date Value   Color, Urine 09/30/2022 ORANGE (A)    APPearance 09/30/2022 HAZY (A)    Specific Gravity, Urine 09/30/2022 1.020    pH 09/30/2022 6.0    Glucose, UA 09/30/2022 NEGATIVE    Hgb urine dipstick 09/30/2022 LARGE (A)    Bilirubin Urine 09/30/2022 NEGATIVE    Ketones, ur 09/30/2022 NEGATIVE    Protein, ur 09/30/2022 30 (A)    Nitrite 09/30/2022 NEGATIVE    Leukocytes,Ua 09/30/2022 LARGE (A)    RBC / HPF 09/30/2022 >50 (H)    WBC, UA 09/30/2022 >50 (H)    Bacteria, UA 09/30/2022 RARE (A)    Squamous Epithelial / HPF 09/30/2022 0-5    Mucus 09/30/2022 PRESENT    Specimen Description 09/30/2022                     Value:URINE,  CATHETERIZED Performed at [facility], [address] [place], [place] [address]    Special Requests 09/30/2022                     Value:NONE Performed at [facility], [address], [place], [place] [address]    Culture 09/30/2022  (A)                    Value:<10,000 COLONIES/mL INSIGNIFICANT GROWTH Performed at [facility], [address], [place], [place] [address]    Report Status 09/30/2022 10/01/2022 FINAL   Lab on 09/21/2022  Component Date Value   Hgb A1c MFr Bld 09/21/2022 6.8 (H)    TSH 09/21/2022 1.43    Sodium 09/21/2022 142    Potassium 09/21/2022 3.6    Chloride 09/21/2022 101    CO2 09/21/2022 30    Glucose, Bld 09/21/2022 253 (H)    BUN 09/21/2022 16    Creatinine, Ser 09/21/2022 1.01    Total Bilirubin 09/21/2022 0.6    Alkaline Phosphatase 09/21/2022 104    AST 09/21/2022 13    ALT 09/21/2022 9    Total Protein 09/21/2022 6.2    Albumin 09/21/2022 3.8    GFR 09/21/2022 64.68    Calcium 09/21/2022 9.7    WBC 09/21/2022 5.2    RBC 09/21/2022 4.47    Hemoglobin 09/21/2022 11.9 (L)    HCT 09/21/2022 36.4 (L)    MCV 09/21/2022 81.4    MCHC 09/21/2022 32.6    RDW 09/21/2022 18.6 (H)    Platelets 09/21/2022 269.0    Neutrophils Relative % 09/21/2022 77.2 (H)    Lymphocytes Relative 09/21/2022 14.4    Monocytes Relative 09/21/2022 6.1    Eosinophils Relative 09/21/2022 1.3    Basophils Relative 09/21/2022 1.0    Neutro Abs 09/21/2022 4.0    Lymphs Abs 09/21/2022 0.7    Monocytes Absolute 09/21/2022 0.3    Eosinophils Absolute 09/21/2022 0.1    Basophils Absolute 09/21/2022 0.1   Admission on 09/21/2022, Discharged on 09/21/2022  Component Date Value   Color, Urine 09/21/2022 ORANGE (A)    APPearance 09/21/2022 CLOUDY (A)    Specific Gravity, Urine 09/21/2022 1.017    pH 09/21/2022 8.5 (H)    Glucose, UA 09/21/2022 NEGATIVE    Hgb urine dipstick 09/21/2022 LARGE (A)    Bilirubin Urine 09/21/2022  NEGATIVE    Ketones, ur 09/21/2022 NEGATIVE    Protein, ur 09/21/2022 >300 (A)    Nitrite 09/21/2022 POSITIVE (A)    Leukocytes,Ua 09/21/2022 LARGE (A)    RBC / HPF 09/21/2022 >50 (H)    WBC, UA 09/21/2022 >50 (H)    Bacteria, UA 09/21/2022 MANY (A)    Squamous Epithelial / HPF 09/21/2022 0-5    WBC Clumps 09/21/2022 PRESENT    Triple Phosphate Crystal 09/21/2022 PRESENT    Non Squamous Epithelial 09/21/2022 0-5 (A)    Crystals 09/21/2022 PRESENT (A)    Specimen Description 09/21/2022                     Value:URINE, CATHETERIZED Performed at [facility], [address] [place], [place] [address]    Special Requests 09/21/2022                     Value:NONE Performed at [facility], [address], [place], [place] [address]    Culture 09/21/2022 >=100,000 COLONIES/mL PROTEUS MIRABILIS (A)    Report Status 09/21/2022  09/23/2022 FINAL    Organism ID, Bacteria 09/21/2022 PROTEUS MIRABILIS (A)   There may be more visits with results that are not included.   No image results found.   No results found.  No results found.     Additional Info: This encounter employed real-time, collaborative documentation. The patient actively reviewed and updated their medical record on a shared screen, ensuring transparency and facilitating joint problem-solving for the problem list, overview, and plan. This approach promotes accurate, informed care. The treatment plan was discussed and reviewed in detail, including medication safety, potential side effects, and all patient questions. We confirmed understanding and comfort with the plan. Follow-up instructions were established, including contacting the office for any concerns, returning if symptoms worsen, persist, or new symptoms develop, and precautions for potential emergency department visits.

## 2023-06-07 ENCOUNTER — Ambulatory Visit
Admission: RE | Admit: 2023-06-07 | Discharge: 2023-06-07 | Disposition: A | Discharge status: Home / Self Care | Payer: Medicare Other | Source: Ambulatory Visit | Attending: Interventional Radiology | Admitting: Interventional Radiology

## 2023-06-07 ENCOUNTER — Other Ambulatory Visit (HOSPITAL_COMMUNITY): Payer: Self-pay | Admitting: Interventional Radiology

## 2023-06-07 DIAGNOSIS — N400 Enlarged prostate without lower urinary tract symptoms: Secondary | ICD-10-CM

## 2023-06-07 DIAGNOSIS — K5730 Diverticulosis of large intestine without perforation or abscess without bleeding: Secondary | ICD-10-CM | POA: Diagnosis not present

## 2023-06-07 DIAGNOSIS — N138 Other obstructive and reflux uropathy: Secondary | ICD-10-CM

## 2023-06-07 MED ORDER — IOPAMIDOL (ISOVUE-370) INJECTION 76%
100.0000 mL | Freq: Once | INTRAVENOUS | Status: AC | PRN
  Administered 2023-06-07: 100 mL via INTRAVENOUS

## 2023-06-12 ENCOUNTER — Telehealth (HOSPITAL_COMMUNITY): Payer: Self-pay | Admitting: Radiology

## 2023-06-12 NOTE — Telephone Encounter (Signed)
Called pt, left VM for him to call to schedule his PAE with Dr. Elby Showers on 06/26/23. JM
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
## 2023-06-14 ENCOUNTER — Encounter: Payer: Self-pay | Admitting: Family Medicine

## 2023-06-14 ENCOUNTER — Ambulatory Visit: Payer: Medicare Other | Admitting: Family Medicine

## 2023-06-14 VITALS — BP 168/76 | HR 66 | Ht 73.0 in | Wt 170.0 lb

## 2023-06-14 DIAGNOSIS — M9902 Segmental and somatic dysfunction of thoracic region: Secondary | ICD-10-CM | POA: Diagnosis not present

## 2023-06-14 DIAGNOSIS — M9903 Segmental and somatic dysfunction of lumbar region: Secondary | ICD-10-CM | POA: Diagnosis not present

## 2023-06-14 DIAGNOSIS — M9901 Segmental and somatic dysfunction of cervical region: Secondary | ICD-10-CM

## 2023-06-14 DIAGNOSIS — M51362 Other intervertebral disc degeneration, lumbar region with discogenic back pain and lower extremity pain: Secondary | ICD-10-CM | POA: Diagnosis not present

## 2023-06-14 DIAGNOSIS — Z23 Encounter for immunization: Secondary | ICD-10-CM | POA: Diagnosis not present

## 2023-06-14 DIAGNOSIS — M9904 Segmental and somatic dysfunction of sacral region: Secondary | ICD-10-CM

## 2023-06-14 NOTE — Patient Instructions (Signed)
See [place] in 8 weeks

## 2023-06-14 NOTE — Assessment & Plan Note (Signed)
Significant arthritic changes noted.  We will need to continue to monitor.  The patient is weaker than usual and does have some deconditioning with patient having so much difficulty with his bladder stones as well as prostamegaly.  Patient is going to have another surgical intervention and would like to get him in to physical therapy soon afterwards.  Will see if we can get approval for him neurologist to see if possible.  He would like to do it for deconditioning as well as good balance and coordination.  Follow-up with me again in 6 to 8 weeks

## 2023-06-15 ENCOUNTER — Encounter: Payer: Self-pay | Admitting: Family Medicine

## 2023-06-24 ENCOUNTER — Other Ambulatory Visit (HOSPITAL_COMMUNITY): Payer: Self-pay | Admitting: Student

## 2023-06-24 DIAGNOSIS — N138 Other obstructive and reflux uropathy: Secondary | ICD-10-CM

## 2023-06-24 MED ORDER — SOLIFENACIN SUCCINATE 5 MG PO TABS: 5.0000 mg | ORAL_TABLET | Freq: Every day | ORAL | 0 refills | Status: AC

## 2023-06-24 MED ORDER — CIPROFLOXACIN HCL 500 MG PO TABS
500.0000 mg | ORAL_TABLET | Freq: Two times a day (BID) | ORAL | 0 refills | Status: AC
Start: 2023-06-24 — End: 2023-07-01

## 2023-06-24 MED ORDER — METHYLPREDNISOLONE 4 MG PO TBPK
ORAL_TABLET | ORAL | 0 refills | Status: DC
Start: 2023-06-24 — End: 2023-08-16

## 2023-06-24 MED ORDER — PHENAZOPYRIDINE HCL 100 MG PO TABS: 100.0000 mg | ORAL_TABLET | Freq: Three times a day (TID) | ORAL | 0 refills | Status: AC | PRN

## 2023-06-26 ENCOUNTER — Ambulatory Visit (HOSPITAL_COMMUNITY)
Admission: RE | Admit: 2023-06-26 | Discharge: 2023-06-26 | Disposition: A | Discharge status: Home / Self Care | Payer: Medicare Other | Source: Ambulatory Visit | Attending: Interventional Radiology | Admitting: Interventional Radiology

## 2023-06-26 ENCOUNTER — Encounter (HOSPITAL_COMMUNITY): Payer: Self-pay

## 2023-06-26 ENCOUNTER — Other Ambulatory Visit: Payer: Self-pay

## 2023-06-26 ENCOUNTER — Other Ambulatory Visit (HOSPITAL_COMMUNITY): Payer: Self-pay | Admitting: Interventional Radiology

## 2023-06-26 ENCOUNTER — Other Ambulatory Visit (HOSPITAL_COMMUNITY): Payer: Self-pay | Admitting: Radiology

## 2023-06-26 ENCOUNTER — Other Ambulatory Visit (HOSPITAL_COMMUNITY): Payer: Self-pay

## 2023-06-26 VITALS — BP 119/62 | HR 91 | Temp 97.7°F | Resp 16 | Ht 73.0 in | Wt 161.0 lb

## 2023-06-26 DIAGNOSIS — Z79899 Other long term (current) drug therapy: Secondary | ICD-10-CM | POA: Diagnosis not present

## 2023-06-26 DIAGNOSIS — N400 Enlarged prostate without lower urinary tract symptoms: Secondary | ICD-10-CM

## 2023-06-26 DIAGNOSIS — Z7982 Long term (current) use of aspirin: Secondary | ICD-10-CM | POA: Insufficient documentation

## 2023-06-26 DIAGNOSIS — I10 Essential (primary) hypertension: Secondary | ICD-10-CM | POA: Diagnosis not present

## 2023-06-26 DIAGNOSIS — Z7901 Long term (current) use of anticoagulants: Secondary | ICD-10-CM | POA: Insufficient documentation

## 2023-06-26 DIAGNOSIS — R339 Retention of urine, unspecified: Secondary | ICD-10-CM

## 2023-06-26 DIAGNOSIS — N401 Enlarged prostate with lower urinary tract symptoms: Secondary | ICD-10-CM | POA: Insufficient documentation

## 2023-06-26 DIAGNOSIS — R338 Other retention of urine: Secondary | ICD-10-CM | POA: Insufficient documentation

## 2023-06-26 DIAGNOSIS — E119 Type 2 diabetes mellitus without complications: Secondary | ICD-10-CM | POA: Insufficient documentation

## 2023-06-26 DIAGNOSIS — N138 Other obstructive and reflux uropathy: Secondary | ICD-10-CM

## 2023-06-26 DIAGNOSIS — I4891 Unspecified atrial fibrillation: Secondary | ICD-10-CM | POA: Insufficient documentation

## 2023-06-26 HISTORY — PX: IR ANGIOGRAM VISCERAL SELECTIVE: IMG657

## 2023-06-26 HISTORY — PX: IR ANGIOGRAM SELECTIVE EACH ADDITIONAL VESSEL: IMG667

## 2023-06-26 HISTORY — PX: IR US GUIDE VASC ACCESS RIGHT: IMG2390

## 2023-06-26 HISTORY — PX: IR EMBO ARTERIAL NOT HEMORR HEMANG INC GUIDE ROADMAPPING: IMG5448

## 2023-06-26 LAB — BASIC METABOLIC PANEL
Anion gap: 13 (ref 5–15)
BUN: 17 mg/dL (ref 8–23)
CO2: 22 mmol/L (ref 22–32)
Calcium: 10.2 mg/dL (ref 8.9–10.3)
Chloride: 106 mmol/L (ref 98–111)
Creatinine, Ser: 1 mg/dL (ref 0.61–1.24)
GFR, Estimated: >60 mL/min (ref >60)
Glucose, Bld: 141 mg/dL — ABNORMAL HIGH (ref 70–99)
Potassium: 4 mmol/L (ref 3.5–5.1)
Sodium: 141 mmol/L (ref 135–145)

## 2023-06-26 LAB — PROTIME-INR
INR: 1 (ref 0.8–1.2)
Prothrombin Time: 13.5 s (ref 11.4–15.2)

## 2023-06-26 LAB — CBC
HCT: 37.7 % — ABNORMAL LOW (ref 39.0–52.0)
Hemoglobin: 12 g/dL — ABNORMAL LOW (ref 13.0–17.0)
MCH: 29.2 pg (ref 26.0–34.0)
MCHC: 31.8 g/dL (ref 30.0–36.0)
MCV: 91.7 fL (ref 80.0–100.0)
Platelets: 177 10*3/uL (ref 150–400)
RBC: 4.11 MIL/uL — ABNORMAL LOW (ref 4.22–5.81)
RDW: 14.7 % (ref 11.5–15.5)
WBC: 4.9 10*3/uL (ref 4.0–10.5)
nRBC: 0 % (ref 0.0–0.2)

## 2023-06-26 LAB — GLUCOSE, CAPILLARY
Glucose-Capillary: 148 mg/dL — ABNORMAL HIGH (ref 70–99)
Glucose-Capillary: 168 mg/dL — ABNORMAL HIGH (ref 70–99)

## 2023-06-26 MED ORDER — MIDAZOLAM HCL 2 MG/2ML IJ SOLN
INTRAMUSCULAR | Status: AC
  Filled 2023-06-26: qty 4

## 2023-06-26 MED ORDER — RIVAROXABAN 10 MG PO TABS: 10.0000 mg | ORAL_TABLET | Freq: Every day | ORAL | Status: DC

## 2023-06-26 MED ORDER — FENTANYL CITRATE (PF) 100 MCG/2ML IJ SOLN
INTRAMUSCULAR | Status: AC
  Filled 2023-06-26: qty 4

## 2023-06-26 MED ORDER — MIDAZOLAM HCL 2 MG/2ML IJ SOLN
INTRAMUSCULAR | Status: AC | PRN
Start: 2023-06-26 — End: 2023-06-26
  Administered 2023-06-26 (×2): 1 mg via INTRAVENOUS
  Administered 2023-06-26: .5 mg via INTRAVENOUS
  Administered 2023-06-26: 1 mg via INTRAVENOUS
  Administered 2023-06-26: .5 mg via INTRAVENOUS

## 2023-06-26 MED ORDER — LIDOCAINE HCL URETHRAL/MUCOSAL 2 % EX GEL
1.0000 | Freq: Once | CUTANEOUS | Status: DC
  Filled 2023-06-26: qty 5

## 2023-06-26 MED ORDER — LIDOCAINE-EPINEPHRINE 1 %-1:100000 IJ SOLN
20.0000 mL | Freq: Once | INTRAMUSCULAR | Status: AC
  Administered 2023-06-26: 6 mL via INTRADERMAL

## 2023-06-26 MED ORDER — FENTANYL CITRATE (PF) 100 MCG/2ML IJ SOLN
INTRAMUSCULAR | Status: AC | PRN
Start: 2023-06-26 — End: 2023-06-26
  Administered 2023-06-26 (×3): 25 ug via INTRAVENOUS

## 2023-06-26 MED ORDER — CIPROFLOXACIN IN D5W 200 MG/100ML IV SOLN
INTRAVENOUS | Status: AC | PRN
  Administered 2023-06-26: 400 mg via INTRAVENOUS

## 2023-06-26 MED ORDER — CIPROFLOXACIN IN D5W 400 MG/200ML IV SOLN
400.0000 mg | INTRAVENOUS | Status: DC
  Filled 2023-06-26: qty 200

## 2023-06-26 MED ORDER — LIDOCAINE-EPINEPHRINE 1 %-1:100000 IJ SOLN
INTRAMUSCULAR | Status: AC
  Filled 2023-06-26: qty 1

## 2023-06-26 MED ORDER — RIVAROXABAN 10 MG PO TABS
10.0000 mg | ORAL_TABLET | Freq: Every day | ORAL | 0 refills | Status: DC
  Filled 2023-06-26: qty 30, 30d supply, fill #0

## 2023-06-26 MED ORDER — IOHEXOL 300 MG/ML  SOLN
150.0000 mL | Freq: Once | INTRAMUSCULAR | Status: AC | PRN
  Administered 2023-06-26: 23 mL via INTRA_ARTERIAL

## 2023-06-26 MED ORDER — RIVAROXABAN 10 MG PO TABS
10.0000 mg | ORAL_TABLET | Freq: Every day | ORAL | 0 refills | Status: DC
Start: 2023-06-26 — End: 2023-08-16

## 2023-06-26 MED ORDER — SODIUM CHLORIDE 0.9 % IV SOLN: INTRAVENOUS | Status: DC

## 2023-06-26 MED ORDER — PREDNISONE 20 MG PO TABS
20.0000 mg | ORAL_TABLET | Freq: Once | ORAL | Status: AC
  Administered 2023-06-26: 20 mg via ORAL
  Filled 2023-06-26: qty 1

## 2023-06-26 MED ORDER — IOHEXOL 300 MG/ML  SOLN
150.0000 mL | Freq: Once | INTRAMUSCULAR | Status: AC | PRN
  Administered 2023-06-26: 120 mL via INTRA_ARTERIAL

## 2023-06-26 NOTE — Procedures (Signed)
Interventional Radiology Procedure Note  Procedure: Right prostatic artery embolization  Findings: Please refer to procedural dictation for full description. 400 micron particle embolization of right prostatic artery with placement of coils.  Right CFA 5 Fr Mynx closure.  Complications: None immediate  Estimated Blood Loss: < 5 mL  Recommendations: Strict 2 hour bedrest (1 hour flat, followed by head of bed up to 30 degrees for 1 hour). IR will arrange Urologic and outpatient follow up.   Marliss Coots, MD
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
## 2023-06-26 NOTE — Progress Notes (Signed)
89 y.o male outpatient. History of BPH with bladder outlet obstruction and urinary retention s/p right prostatic artery embolization with IR Attending Dr. Marliss Coots.   Patient seen at bedside post procedure. Wife present. Currently without any significant complaints. Patient alert and laying in bed,calm. Denies any fevers, headache, chest pain, SOB, cough, abdominal pain, nausea, vomiting or bleeding.   Patient and wife instructed on post op plan of care including: - Discharge with foley catheter in place, - Follow up with Dr Marlou Porch @ [facility] in for 3-4 weeks for voiding trial/ possible foley removal  - Follow up with Dr Elby Showers in 3-4 weeks. - Due to the patient's propensity for hematuria. Change xarelto dosage to 10 mg  EPIC Chat message sent to Dr. Marlou Porch who is an agreement with the plan of care and will coordinate outpatient follow up appointment. Outpatient orders placed for IR Clinic follow up. New prescription sent to [facility] for new Xarelto prescription.  Patient and wife verbalized understanding and are in agreement with the plan of care.

## 2023-07-03 DIAGNOSIS — R338 Other retention of urine: Secondary | ICD-10-CM | POA: Diagnosis not present

## 2023-07-20 ENCOUNTER — Other Ambulatory Visit: Payer: Self-pay

## 2023-07-20 ENCOUNTER — Ambulatory Visit: Payer: Medicare Other | Admitting: Family Medicine

## 2023-07-20 ENCOUNTER — Encounter: Payer: Self-pay | Admitting: Family Medicine

## 2023-07-20 MED ORDER — LISINOPRIL 40 MG PO TABS: 40.0000 mg | ORAL_TABLET | Freq: Every day | ORAL | 3 refills | Status: DC
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
## 2023-07-27 ENCOUNTER — Ambulatory Visit
Admission: RE | Admit: 2023-07-27 | Discharge: 2023-07-27 | Disposition: A | Discharge status: Home / Self Care | Payer: Medicare Other | Source: Ambulatory Visit | Attending: Radiology | Admitting: Radiology

## 2023-07-27 DIAGNOSIS — N401 Enlarged prostate with lower urinary tract symptoms: Secondary | ICD-10-CM | POA: Diagnosis not present

## 2023-07-27 DIAGNOSIS — R338 Other retention of urine: Secondary | ICD-10-CM | POA: Diagnosis not present

## 2023-07-27 HISTORY — PX: IR RADIOLOGIST EVAL & MGMT: IMG5224

## 2023-07-30 ENCOUNTER — Ambulatory Visit: Payer: Medicare Other | Admitting: Family Medicine
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
## 2023-07-31 ENCOUNTER — Ambulatory Visit (INDEPENDENT_AMBULATORY_CARE_PROVIDER_SITE_OTHER): Payer: Medicare Other | Admitting: Family Medicine

## 2023-07-31 ENCOUNTER — Encounter: Payer: Self-pay | Admitting: Family Medicine

## 2023-07-31 VITALS — BP 118/66 | HR 83 | Ht 73.0 in | Wt 168.0 lb

## 2023-07-31 DIAGNOSIS — M9901 Segmental and somatic dysfunction of cervical region: Secondary | ICD-10-CM | POA: Diagnosis not present

## 2023-07-31 DIAGNOSIS — M51362 Other intervertebral disc degeneration, lumbar region with discogenic back pain and lower extremity pain: Secondary | ICD-10-CM | POA: Diagnosis not present

## 2023-07-31 DIAGNOSIS — M9908 Segmental and somatic dysfunction of rib cage: Secondary | ICD-10-CM | POA: Diagnosis not present

## 2023-07-31 DIAGNOSIS — M9904 Segmental and somatic dysfunction of sacral region: Secondary | ICD-10-CM | POA: Diagnosis not present

## 2023-07-31 DIAGNOSIS — R279 Unspecified lack of coordination: Secondary | ICD-10-CM | POA: Diagnosis not present

## 2023-07-31 DIAGNOSIS — M9902 Segmental and somatic dysfunction of thoracic region: Secondary | ICD-10-CM

## 2023-07-31 DIAGNOSIS — M9903 Segmental and somatic dysfunction of lumbar region: Secondary | ICD-10-CM | POA: Diagnosis not present

## 2023-07-31 DIAGNOSIS — R2689 Other abnormalities of gait and mobility: Secondary | ICD-10-CM

## 2023-07-31 NOTE — Patient Instructions (Addendum)
Good to see you! Aquatic Therapy See you again in 8 weeks

## 2023-07-31 NOTE — Assessment & Plan Note (Signed)
Patient is having exacerbation secondary to chronic difficulties.  Patient is trying to be more active but finding it hard because he is so deconditioned from his previous surgeries.  Do feel that starting him in aquatic therapy may be more helpful at the moment.  Discussed which activities to do and which ones to avoid.  Increase activity slowly over the course of next several weeks.  Follow-up with me again in 6 to 8 weeks.

## 2023-08-13 DIAGNOSIS — R338 Other retention of urine: Secondary | ICD-10-CM | POA: Diagnosis not present

## 2023-08-13 DIAGNOSIS — N401 Enlarged prostate with lower urinary tract symptoms: Secondary | ICD-10-CM | POA: Diagnosis not present

## 2023-08-13 DIAGNOSIS — R351 Nocturia: Secondary | ICD-10-CM | POA: Diagnosis not present

## 2023-08-16 ENCOUNTER — Encounter: Payer: Self-pay | Admitting: Podiatry

## 2023-08-16 ENCOUNTER — Ambulatory Visit (INDEPENDENT_AMBULATORY_CARE_PROVIDER_SITE_OTHER): Payer: Medicare Other | Admitting: Podiatry

## 2023-08-16 DIAGNOSIS — E1151 Type 2 diabetes mellitus with diabetic peripheral angiopathy without gangrene: Secondary | ICD-10-CM | POA: Diagnosis not present

## 2023-08-16 DIAGNOSIS — M79674 Pain in right toe(s): Secondary | ICD-10-CM

## 2023-08-16 DIAGNOSIS — B351 Tinea unguium: Secondary | ICD-10-CM

## 2023-08-16 DIAGNOSIS — M79675 Pain in left toe(s): Secondary | ICD-10-CM

## 2023-08-16 NOTE — Progress Notes (Signed)
This patient returns to my office for at risk foot care.  This patient requires this care by a professional since this patient will be at risk due to having type 2 diabetes and coagulation defect.  Patient is taking xarelto.  This patient presents to the office with his wife.  This patient is unable to cut nails himself since the patient cannot reach his nails.These nails are painful walking and wearing shoes.  This patient presents for at risk foot care today.  General Appearance  Alert, conversant and in no acute stress.  Vascular  Dorsalis pedis   pulses are  weakly palpable  bilaterally. Posterior tibial pulses are absent  Bilaterally. Capillary return is within normal limits  bilaterally. Temperature is within normal limits  bilaterally.  Neurologic  Senn-Weinstein monofilament wire test diminished   bilaterally. Muscle power within normal limits bilaterally.  Nails Thick disfigured discolored nails with subungual debris  from hallux to fifth toes bilaterally. Hammer toes 2-4  B/L.  Orthopedic  No limitations of motion  feet .  No crepitus or effusions noted.  No bony pathology or digital deformities noted.  Skin  normotropic skin with no porokeratosis noted bilaterally.  No signs of infections or ulcers noted.     Onychomycosis  Pain in right toes  Pain in left toes  Consent was obtained for treatment procedures.   Mechanical debridement of nails 1-5  bilaterally performed with a nail nipper.  Filed with dremel without incident.    Return office visit   10 weeks                 Told patient to return for periodic foot care and evaluation due to potential at risk complications.   Helane Gunther DPM

## 2023-08-28 ENCOUNTER — Encounter: Payer: Self-pay | Admitting: Family Medicine

## 2023-08-28 ENCOUNTER — Other Ambulatory Visit: Payer: Self-pay
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
## 2023-08-29 ENCOUNTER — Ambulatory Visit (HOSPITAL_BASED_OUTPATIENT_CLINIC_OR_DEPARTMENT_OTHER): Payer: Medicare Other | Admitting: Physical Therapy

## 2023-08-29 ENCOUNTER — Encounter (HOSPITAL_BASED_OUTPATIENT_CLINIC_OR_DEPARTMENT_OTHER): Payer: Self-pay

## 2023-08-30 ENCOUNTER — Other Ambulatory Visit: Payer: Self-pay | Admitting: Family Medicine

## 2023-09-03 ENCOUNTER — Ambulatory Visit: Payer: Medicare Other | Attending: Cardiovascular Disease | Admitting: Cardiovascular Disease

## 2023-09-03 ENCOUNTER — Encounter: Payer: Self-pay | Admitting: Cardiovascular Disease

## 2023-09-03 VITALS — BP 134/66 | HR 72 | Resp 16 | Ht 73.0 in | Wt 162.0 lb

## 2023-09-03 DIAGNOSIS — I10 Essential (primary) hypertension: Secondary | ICD-10-CM | POA: Diagnosis not present

## 2023-09-03 DIAGNOSIS — I480 Paroxysmal atrial fibrillation: Secondary | ICD-10-CM | POA: Diagnosis not present

## 2023-09-03 DIAGNOSIS — I4510 Unspecified right bundle-branch block: Secondary | ICD-10-CM | POA: Diagnosis not present

## 2023-09-03 DIAGNOSIS — I2510 Atherosclerotic heart disease of native coronary artery without angina pectoris: Secondary | ICD-10-CM | POA: Diagnosis not present

## 2023-09-03 NOTE — Patient Instructions (Signed)
Medication Instructions:  Your physician recommends that you continue on your current medications as directed. Please refer to the Current Medication list given to you today.  *If you need a refill on your cardiac medications before your next appointment, please call your pharmacy*  Lab Work: If you have labs (blood work) drawn today and your tests are completely normal, you will receive your results only by: MyChart Message (if you have MyChart) OR A paper copy in the mail If you have any lab test that is abnormal or we need to change your treatment, we will call you to review the results.  Follow-Up: At [facility], you and your health needs are our priority.  As part of our continuing mission to provide you with exceptional heart care, we have created designated Provider Care Teams.  These Care Teams include your primary Cardiologist (physician) and Advanced Practice Providers (APPs -  Physician Assistants and Nurse Practitioners) who all work together to provide you with the care you need, when you need it.  We recommend signing up for the patient portal called "MyChart".  Sign up information is provided on this After Visit Summary.  MyChart is used to connect with patients for Virtual Visits (Telemedicine).  Patients are able to view lab/test results, encounter notes, upcoming appointments, etc.  Non-urgent messages can be sent to your provider as well.   To learn more about what you can do with MyChart, go to [email or web address].    Your next appointment:   6 month(s)  Provider:   Peter Nishan, MD     

## 2023-09-07 MED ORDER — RIVAROXABAN 10 MG PO TABS: 10.0000 mg | ORAL_TABLET | Freq: Every day | ORAL | 3 refills | Status: DC

## 2023-09-10 ENCOUNTER — Ambulatory Visit (INDEPENDENT_AMBULATORY_CARE_PROVIDER_SITE_OTHER): Payer: Medicare Other | Admitting: Podiatry

## 2023-09-10 ENCOUNTER — Encounter: Payer: Self-pay | Admitting: Podiatry

## 2023-09-10 DIAGNOSIS — M2140 Flat foot [pes planus] (acquired), unspecified foot: Secondary | ICD-10-CM | POA: Diagnosis not present

## 2023-09-10 DIAGNOSIS — M216X1 Other acquired deformities of right foot: Secondary | ICD-10-CM | POA: Diagnosis not present
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
## 2023-09-25 ENCOUNTER — Ambulatory Visit: Payer: Medicare Other | Admitting: Family Medicine

## 2023-09-25 VITALS — BP 110/64 | HR 63 | Ht 73.0 in | Wt 176.0 lb

## 2023-09-25 DIAGNOSIS — M9903 Segmental and somatic dysfunction of lumbar region: Secondary | ICD-10-CM | POA: Diagnosis not present

## 2023-09-25 DIAGNOSIS — M9904 Segmental and somatic dysfunction of sacral region: Secondary | ICD-10-CM | POA: Diagnosis not present

## 2023-09-25 DIAGNOSIS — M9902 Segmental and somatic dysfunction of thoracic region: Secondary | ICD-10-CM | POA: Diagnosis not present

## 2023-09-25 DIAGNOSIS — M51362 Other intervertebral disc degeneration, lumbar region with discogenic back pain and lower extremity pain: Secondary | ICD-10-CM | POA: Diagnosis not present

## 2023-09-25 NOTE — Patient Instructions (Signed)
See me again in 6 weeks 

## 2023-09-25 NOTE — Assessment & Plan Note (Signed)
 Chronic problem with exacerbation noted today.  Likely just secondary to the arthritic changes, inactivity as well as the weather.  Social determinant health includes patient is exercising 0 days a week.  Encourage patient to try to be active or possible.  Discussed icing regimen.  Follow-up again in 6 to 8 weeks

## 2023-09-26 ENCOUNTER — Encounter: Payer: Self-pay | Admitting: Family Medicine

## 2023-10-25 ENCOUNTER — Ambulatory Visit: Payer: Medicare Other | Admitting: Podiatry

## 2023-10-29 ENCOUNTER — Encounter: Payer: Self-pay | Admitting: Podiatry

## 2023-10-29 ENCOUNTER — Ambulatory Visit (INDEPENDENT_AMBULATORY_CARE_PROVIDER_SITE_OTHER): Payer: Medicare Other | Admitting: Podiatry

## 2023-10-29 DIAGNOSIS — E1151 Type 2 diabetes mellitus with diabetic peripheral angiopathy without gangrene: Secondary | ICD-10-CM | POA: Diagnosis not present

## 2023-10-29 DIAGNOSIS — B351 Tinea unguium: Secondary | ICD-10-CM | POA: Diagnosis not present

## 2023-10-29 DIAGNOSIS — M79674 Pain in right toe(s): Secondary | ICD-10-CM

## 2023-10-29 DIAGNOSIS — M79675 Pain in left toe(s): Secondary | ICD-10-CM

## 2023-10-29 NOTE — Progress Notes (Signed)
This patient returns to my office for at risk foot care.  This patient requires this care by a professional since this patient will be at risk due to having type 2 diabetes and coagulation defect.  Patient is taking xarelto.  This patient presents to the office with his wife.  This patient is unable to cut nails himself since the patient cannot reach his nails.These nails are painful walking and wearing shoes.  This patient presents for at risk foot care today.  General Appearance  Alert, conversant and in no acute stress.  Vascular  Dorsalis pedis   pulses are  weakly palpable  bilaterally. Posterior tibial pulses are absent  Bilaterally. Capillary return is within normal limits  bilaterally. Temperature is within normal limits  bilaterally.  Neurologic  Senn-Weinstein monofilament wire test diminished   bilaterally. Muscle power within normal limits bilaterally.  Nails Thick disfigured discolored nails with subungual debris  from hallux to fifth toes bilaterally. Hammer toes 2-4  B/L.  Orthopedic  No limitations of motion  feet .  No crepitus or effusions noted.  No bony pathology or digital deformities noted.  Skin  normotropic skin with no porokeratosis noted bilaterally.  No signs of infections or ulcers noted.     Onychomycosis  Pain in right toes  Pain in left toes  Consent was obtained for treatment procedures.   Mechanical debridement of nails 1-5  bilaterally performed with a nail nipper.  Filed with dremel without incident.    Return office visit   10 weeks                 Told patient to return for periodic foot care and evaluation due to potential at risk complications.   Helane Gunther DPM

## 2023-10-30 ENCOUNTER — Encounter: Payer: Self-pay | Admitting: Family Medicine

## 2023-10-30 ENCOUNTER — Other Ambulatory Visit: Payer: Self-pay

## 2023-10-30 MED ORDER — AMLODIPINE BESYLATE 2.5 MG PO TABS
2.5000 mg | ORAL_TABLET | Freq: Every day | ORAL | 3 refills | Status: DC
Start: 2023-10-30 — End: 2024-02-05
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
## 2023-11-06 ENCOUNTER — Encounter: Payer: Self-pay | Admitting: Family Medicine

## 2023-11-06 ENCOUNTER — Ambulatory Visit (INDEPENDENT_AMBULATORY_CARE_PROVIDER_SITE_OTHER): Payer: Medicare Other | Admitting: Family Medicine

## 2023-11-06 VITALS — BP 132/60 | HR 63 | Ht 73.0 in | Wt 173.0 lb

## 2023-11-06 DIAGNOSIS — M9904 Segmental and somatic dysfunction of sacral region: Secondary | ICD-10-CM | POA: Diagnosis not present

## 2023-11-06 DIAGNOSIS — M9908 Segmental and somatic dysfunction of rib cage: Secondary | ICD-10-CM

## 2023-11-06 DIAGNOSIS — M9902 Segmental and somatic dysfunction of thoracic region: Secondary | ICD-10-CM | POA: Diagnosis not present

## 2023-11-06 DIAGNOSIS — M9903 Segmental and somatic dysfunction of lumbar region: Secondary | ICD-10-CM | POA: Diagnosis not present

## 2023-11-06 DIAGNOSIS — M51362 Other intervertebral disc degeneration, lumbar region with discogenic back pain and lower extremity pain: Secondary | ICD-10-CM

## 2023-11-06 DIAGNOSIS — M9901 Segmental and somatic dysfunction of cervical region: Secondary | ICD-10-CM | POA: Diagnosis not present

## 2023-11-06 NOTE — Patient Instructions (Signed)
 Good to see you! Enjoy the weather See you again in 6 weeks

## 2023-11-06 NOTE — Assessment & Plan Note (Signed)
 Significant arthritis noted, discussed hep, SDOH limited physical activity  RTC in 8 weeks

## 2023-11-17 ENCOUNTER — Other Ambulatory Visit: Payer: Self-pay | Admitting: Family Medicine

## 2023-11-27 ENCOUNTER — Ambulatory Visit (INDEPENDENT_AMBULATORY_CARE_PROVIDER_SITE_OTHER): Payer: Medicare Other | Admitting: Family Medicine

## 2023-11-27 ENCOUNTER — Encounter: Payer: Self-pay | Admitting: Family Medicine

## 2023-11-27 VITALS — BP 120/72 | HR 64 | Temp 97.2°F | Ht 73.0 in | Wt 176.6 lb

## 2023-11-27 DIAGNOSIS — I480 Paroxysmal atrial fibrillation: Secondary | ICD-10-CM | POA: Diagnosis not present

## 2023-11-27 DIAGNOSIS — E1159 Type 2 diabetes mellitus with other circulatory complications: Secondary | ICD-10-CM | POA: Diagnosis not present

## 2023-11-27 DIAGNOSIS — R413 Other amnesia: Secondary | ICD-10-CM

## 2023-11-27 DIAGNOSIS — I10 Essential (primary) hypertension: Secondary | ICD-10-CM

## 2023-11-27 DIAGNOSIS — E785 Hyperlipidemia, unspecified: Secondary | ICD-10-CM | POA: Diagnosis not present

## 2023-11-27 MED ORDER — TRIAMCINOLONE ACETONIDE 0.1 % EX CREA: 1.0000 | TOPICAL_CREAM | Freq: Two times a day (BID) | CUTANEOUS | 0 refills | Status: DC

## 2023-11-27 NOTE — Patient Instructions (Addendum)
 trial triamcinolone for foot and ankle rash for 7-10 days- could refer to dermatology if needed  We have placed a referral for you today to [facility]- please call their # if you do not hear within a week (may be listed below or you may see mychart message within a few days with #).   We will call you within two weeks about your referral for MRI brain  through [facility].  Their phone number is [phone].  Please call them if you have not heard in 1-2 weeks  Reasonable to get RSV next cold/flu season -send [place] dates of shingrix  Please stop by lab before you go If you have mychart- we will send your results within 3 business days of [place] receiving them.  If you do not have mychart- we will call you about results within 5 business days of [place] receiving them.  *please also note that you will see labs on mychart as soon as they post. I will later go in and write notes on them- will say "notes from Dr. Durene Cal"    Recommended follow up: Return in about 6 months (around 05/29/2024) for followup or sooner if needed.Schedule b4 you leave.

## 2023-11-27 NOTE — Progress Notes (Signed)
 Phone [phone] In person visit   Subjective:   Sean Lane is a 89 y.o. year old very pleasant male patient who presents for/with See problem oriented charting Chief Complaint  Patient presents with   Medical Management of Chronic Issues   Diabetes   Hyperlipidemia   Hypertension   memory issues    Pt c/o increased issues with memory.   runny nose    Pt c/o runny nose that has been going on for months.   Rash    Pt c/o rash on ankle.    Past Medical History-  Patient Active Problem List   Diagnosis Date Noted   Memory change 11/09/2022    Priority: High   CAD S/P percutaneous coronary angioplasty 10/26/2016    Priority: High   Type 2 diabetes mellitus with CAD(HCC) 12/14/2012    Priority: High   Atrial flutter (HCC) 10/03/2011    Priority: High   CAD (coronary artery disease) 12/17/2007    Priority: High   SINUS BRADYCARDIA 07/20/2010    Priority: Medium    BPH associated with nocturia 06/08/2009    Priority: Medium    DIZZINESS 12/15/2008    Priority: Medium    Hyperlipidemia 04/02/2007    Priority: Medium    Essential hypertension 04/02/2007    Priority: Medium    Diverticulitis of colon 11/22/2015    Priority: Low   Nephrolithiasis 05/07/2014    Priority: Low   Gout attack 12/14/2012    Priority: Low   Hematuria 03/28/2012    Priority: Low   RBBB 12/28/2008    Priority: Low   GERD 12/15/2008    Priority: Low   LOSS, CONDUCTIVE HEARING, COMBINED TYPE 04/16/2007    Priority: Low   Allergic rhinitis 04/02/2007    Priority: Low   Bilateral wrist pain 05/18/2021    Priority: 1.   Arthritis of carpometacarpal [facility]) joint of both thumbs 05/03/2021    Priority: 1.   Left rotator cuff tear 12/09/2020    Priority: 1.   Degenerative disc disease, lumbar 07/21/2020    Priority: 1.   Nonallopathic lesion of sacral region 12/17/2019    Priority: 1.   Nonallopathic lesion of lumbosacral region 12/17/2019    Priority: 1.   Spasm of left  piriformis muscle 06/27/2019    Priority: 1.   Degenerative disc disease, cervical 05/28/2019    Priority: 1.   Nonallopathic lesion of thoracic region 05/28/2019    Priority: 1.   Sensorineural hearing loss (SNHL) of both ears 12/01/2022    Medications- reviewed and updated Current Outpatient Medications  Medication Sig Dispense Refill   ACCU-CHEK GUIDE test strip USE TO TEST ONCE DAILY. DX: E11.9 100 strip 0   Accu-Chek Softclix Lancets lancets USE TO CHECK BLOOD SUGAR ONCE DAILY. Dx: E11.9 100 each 0   acetaminophen (TYLENOL) 650 MG CR tablet Take 1,300 mg by mouth 3 (three) times daily.     amLODipine (NORVASC) 2.5 MG tablet Take 1 tablet (2.5 mg total) by mouth daily. 90 tablet 3   aspirin EC 81 MG tablet Take 81 mg by mouth daily.     Blood Glucose Monitoring Suppl (ACCU-CHEK GUIDE ME) w/Device KIT Use to test blood sugars once daily. Dx: E11.9. Last meter supplied over 5 years ago- please give patient cash pay price if this is not covered or send [place] the script you need and we will sign. 1 kit 0   calcium carbonate (TUMS - DOSED IN MG ELEMENTAL CALCIUM) 500 MG chewable tablet  Chew 2 tablets (400 mg of elemental calcium total) by mouth daily as needed for indigestion or heartburn.     cholecalciferol (VITAMIN D3) 25 MCG (1000 UNIT) tablet Take 2,000 Units by mouth daily.     clobetasol (OLUX) 0.05 % topical foam Apply topically daily. (Patient taking differently: Apply 1 Application topically daily as needed (itchy scalp).) 50 g 3   DULoxetine (CYMBALTA) 20 MG capsule TAKE 1 CAPSULE DAILY 90 capsule 1   dutasteride (AVODART) 0.5 MG capsule Take 0.5 mg by mouth daily.     Emollient (GOLD BOND DIABETICS DRY SKIN) CREA Apply 1 Application topically daily as needed (dry skin).     furosemide (LASIX) 20 MG tablet TAKE 1 TABLET BY MOUTH EVERY DAY AS NEEDED 90 tablet 3   hydrALAZINE (APRESOLINE) 10 MG tablet Take 1 tablet (10 mg total) by mouth as needed (for SBP over 180). 30 tablet 3    lisinopril (ZESTRIL) 40 MG tablet Take 1 tablet (40 mg total) by mouth daily. 90 tablet 3   Naphazoline-Pheniramine (OPCON-A OP) Apply 1 drop to eye daily as needed (allergies).     nitroGLYCERIN (NITROSTAT) 0.4 MG SL tablet Place 1 tablet (0.4 mg total) under the tongue every 5 (five) minutes as needed for chest pain (for chest pain). Use up to 3 dosages, if no relief call 911. 75 tablet 1   rivaroxaban (XARELTO) 10 MG TABS tablet Take 1 tablet (10 mg total) by mouth daily. 90 tablet 3   rosuvastatin (CRESTOR) 10 MG tablet TAKE 1 TABLET DAILY 90 tablet 3   triamcinolone cream (KENALOG) 0.1 % Apply 1 Application topically 2 (two) times daily. For 7-10 days maximum for rash on ankle 80 g 0   No current facility-administered medications for this visit.     Objective:  BP 120/72   Pulse 64   Temp (!) 97.2 F (36.2 C)   Ht 6\' 1"  (1.854 m)   Wt 176 lb 9.6 oz (80.1 kg)   SpO2 96%   BMI 23.30 kg/m  Gen: NAD, resting comfortably CV: RRR no murmurs rubs or gallops Lungs: CTAB no crackles, wheeze, rhonchi Ext: trace edema Skin: warm, dry  Diabetic Foot Exam - Simple   Simple Foot Form Diabetic Foot exam was performed with the following findings: Yes 11/27/2023  3:27 PM  Visual Inspection No deformities, no ulcerations, no other skin breakdown bilaterally: Yes Sensation Testing Intact to touch and monofilament testing bilaterally: Yes Pulse Check Posterior Tibialis and Dorsalis pulse intact bilaterally: Yes Comments Pulse 1+ in each location - callous base of 1st MTP right foot -mild erythematous macules with reported pruritis on right foot top and ankle medially        Assessment and Plan   # Memory issues S: MMSE 28 out of 30 two visits ago.  Audiology referral for hearing loss due to potential associations- wearing new aids.  He had wanted to hold off on repeat MMSE as it was stressful for him.  B12 was reassuring.  -he reports gradual worsening as time goes on. Occasionally has  hard time finding words or understanding. More trouble with remote.  - reports not having trouble driving or making decisions- but things like computer work really bother him - B12 daily right now for the most part- prefer level over 400  Lab Results  Component Value Date   VITAMINB12 378 05/29/2023   A/P: ongoing memory loss issues- he declines MMSE testing- too stressful  - does want MRI and retest bloodwork  as below. Declines HIV and syphilis testing.  -interested in neurology consult with worsening- refer today  # Runny nose S: Patient reports has been going on for several months. Ongoing drip.  -Advised against afrin A/P: likely senile rhinitis- will monitor    #Rash S: Rash on hisright foot/ankle for 6 months- cortisone helps when sparingly uses- never consistent  ROS-not ill appearing, no fever/chills. No new medications. Not immunocompromised. No mucus membrane involvement.  A/P: unclear cause- not obviously fungal and responds to steroid- trial triamcinolone for foot and ankle rash for 7-10 days- could refer to dermatology if needed  # Atrial fibrillation fortunately with low A-fib burden less than 1% S: Rate controlled without medicine Anticoagulated with Xarelto 10 mg- $90 day supply is $700- mentioned possible coumadin but also 2000 medicine cap up front A/P: appropriately anticoagulated and rate controlled (not needing medicine for this portion)-  continue current medicine -discussed clinical pharmacist to help with cost potentially if needed or changing to coumadin   #hypertension S: medication: Amlodipine 2.5 mg, hydralazine 10 mg as needed for blood pressure over 180, lisinopril 40 mg -Dutasteride may give some small benefit BP Readings from Last 3 Encounters:  11/27/23 120/72  11/06/23 132/60  09/25/23 110/64  A/P: well controlled continue current medications    # CAD #hyperlipidemia S: Medication:Rosuvastatin 10 mg -no chest pain or shortness of breath  Lab  Results  Component Value Date   CHOL 109 05/29/2023   HDL 41.60 05/29/2023   LDLCALC 49 05/29/2023   LDLDIRECT 54.0 06/05/2017   TRIG 91.0 05/29/2023   CHOLHDL 3 05/29/2023   A/P: coronary artery disease asymptomatic continue current medications - lipids at goal- continue current medications    # Diabetes S: Medication: None-diet controlled Lab Results  Component Value Date   HGBA1C 6.3 (H) 03/16/2023   HGBA1C 6.8 (H) 09/21/2022   HGBA1C 6.6 (H) 05/11/2022   A/P: hopefully stable- update a1c today. Continue without meds for now  -Discussed lab error with microalbumin/creatinine ratio-fortunately heis already on lisinopril. Most recent adjustment would be 41 but not interested in jardiance at this time so as long as stable likely between 30-70 hold off on considering this agian  Recommended follow up: Return in about 6 months (around 05/29/2024) for followup or sooner if needed.Schedule b4 you leave. Future Appointments  Date Time Provider Department Center  12/19/2023  3:15 PM Judi Saa, DO [facility] None  01/28/2024  2:15 PM Helane Gunther, DPM [facility]  02/22/2024  2:00 PM Wendall Stade, MD [facility]  04/29/2024 10:00 AM [facility] ANNUAL WELLNESS VISIT 1 [facility]    Lab/Order associations:   ICD-10-CM   1. Memory loss  R41.3 MR BRAIN WO CONTRAST    Vitamin B12    TSH    Ambulatory referral to Neurology    2. Type 2 diabetes mellitus with other circulatory complication, without long-term current use of insulin (HCC)  E11.59 Hemoglobin A1c    Comprehensive metabolic panel    CBC with Differential/Platelet    Microalbumin / creatinine urine ratio    3. Hyperlipidemia, unspecified hyperlipidemia type  E78.5 Hemoglobin A1c    Comprehensive metabolic panel    CBC with Differential/Platelet    4. Essential hypertension  I10     5. PAF (paroxysmal atrial fibrillation) (HCC) Chronic I48.0       Meds ordered this encounter  Medications    triamcinolone cream (KENALOG) 0.1 %    Sig: Apply 1 Application topically 2 (two) times  daily. For 7-10 days maximum for rash on ankle    Dispense:  80 g    Refill:  0    Return precautions advised.  Tana Conch, MD

## 2023-11-28 ENCOUNTER — Encounter: Payer: Self-pay | Admitting: Family Medicine

## 2023-11-28 LAB — COMPREHENSIVE METABOLIC PANEL
ALT: 9 U/L (ref 0–53)
AST: 12 U/L (ref 0–37)
Albumin: 4.4 g/dL (ref 3.5–5.2)
Alkaline Phosphatase: 95 U/L (ref 39–117)
BUN: 21 mg/dL (ref 6–23)
CO2: 30 meq/L (ref 19–32)
Calcium: 10 mg/dL (ref 8.4–10.5)
Chloride: 104 meq/L (ref 96–112)
Creatinine, Ser: 0.88 mg/dL (ref 0.40–1.50)
GFR: 74.17 mL/min (ref >60.00)
Glucose, Bld: 106 mg/dL — ABNORMAL HIGH (ref 70–99)
Potassium: 4.2 meq/L (ref 3.5–5.1)
Sodium: 139 meq/L (ref 135–145)
Total Bilirubin: 0.4 mg/dL (ref 0.2–1.2)
Total Protein: 7 g/dL (ref 6.0–8.3)

## 2023-11-28 LAB — CBC WITH DIFFERENTIAL/PLATELET
Basophils Absolute: 0 10*3/uL (ref 0.0–0.1)
Basophils Relative: 1 % (ref 0.0–3.0)
Eosinophils Absolute: 0.2 10*3/uL (ref 0.0–0.7)
Eosinophils Relative: 4.5 % (ref 0.0–5.0)
HCT: 40.2 % (ref 39.0–52.0)
Hemoglobin: 13.4 g/dL (ref 13.0–17.0)
Lymphocytes Relative: 24.4 % (ref 12.0–46.0)
Lymphs Abs: 1 10*3/uL (ref 0.7–4.0)
MCHC: 33.4 g/dL (ref 30.0–36.0)
MCV: 96 fl (ref 78.0–100.0)
Monocytes Absolute: 0.3 10*3/uL (ref 0.1–1.0)
Monocytes Relative: 6.8 % (ref 3.0–12.0)
Neutro Abs: 2.5 10*3/uL (ref 1.4–7.7)
Neutrophils Relative %: 63.3 % (ref 43.0–77.0)
Platelets: 216 10*3/uL (ref 150.0–400.0)
RBC: 4.18 Mil/uL — ABNORMAL LOW (ref 4.22–5.81)
RDW: 15.7 % — ABNORMAL HIGH (ref 11.5–15.5)
WBC: 4 10*3/uL (ref 4.0–10.5)

## 2023-11-28 LAB — MICROALBUMIN / CREATININE URINE RATIO
Creatinine,U: 71.5 mg/dL
Microalb Creat Ratio: 31.2 mg/g — ABNORMAL HIGH (ref 0.0–30.0)
Microalb, Ur: 2.2 mg/dL — ABNORMAL HIGH (ref 0.0–1.9)

## 2023-11-28 LAB — HEMOGLOBIN A1C: Hgb A1c MFr Bld: 6.3 % (ref 4.6–6.5)

## 2023-11-28 LAB — VITAMIN B12: Vitamin B-12: 430 pg/mL (ref 211–911)

## 2023-11-28 LAB — TSH: TSH: 1 u[IU]/mL (ref 0.35–5.50)

## 2023-11-29 MED ORDER — LORAZEPAM 0.5 MG PO TABS: 0.2500 mg | ORAL_TABLET | Freq: Every day | ORAL | 0 refills | Status: DC | PRN

## 2023-12-04 ENCOUNTER — Encounter: Payer: Self-pay | Admitting: Physician Assistant

## 2023-12-04 ENCOUNTER — Encounter: Payer: Self-pay | Admitting: Family Medicine

## 2023-12-13 ENCOUNTER — Ambulatory Visit
Admission: RE | Admit: 2023-12-13 | Discharge: 2023-12-13 | Disposition: A | Discharge status: Home / Self Care | Source: Ambulatory Visit | Attending: Family Medicine | Admitting: Family Medicine

## 2023-12-13 DIAGNOSIS — R413 Other amnesia: Secondary | ICD-10-CM | POA: Diagnosis not present
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
## 2023-12-19 ENCOUNTER — Ambulatory Visit: Payer: Medicare Other | Admitting: Family Medicine

## 2023-12-19 ENCOUNTER — Encounter: Payer: Self-pay | Admitting: Family Medicine

## 2023-12-19 VITALS — BP 120/60 | HR 77 | Ht 73.0 in | Wt 174.0 lb

## 2023-12-19 DIAGNOSIS — M9902 Segmental and somatic dysfunction of thoracic region: Secondary | ICD-10-CM

## 2023-12-19 DIAGNOSIS — M51362 Other intervertebral disc degeneration, lumbar region with discogenic back pain and lower extremity pain: Secondary | ICD-10-CM

## 2023-12-19 DIAGNOSIS — M9908 Segmental and somatic dysfunction of rib cage: Secondary | ICD-10-CM

## 2023-12-19 DIAGNOSIS — M9903 Segmental and somatic dysfunction of lumbar region: Secondary | ICD-10-CM | POA: Diagnosis not present

## 2023-12-19 DIAGNOSIS — M9904 Segmental and somatic dysfunction of sacral region: Secondary | ICD-10-CM | POA: Diagnosis not present

## 2023-12-19 DIAGNOSIS — M9901 Segmental and somatic dysfunction of cervical region: Secondary | ICD-10-CM

## 2023-12-19 NOTE — Patient Instructions (Addendum)
 Good to see you. Return in 6 weeks.  May 7 [facility] appt.

## 2023-12-19 NOTE — Assessment & Plan Note (Addendum)
 Known degenerative disc disease.  Fairly severe overall.  Do think patient is having unfortunately some increased severity and tightness noted.  Discussed icing regimen at home exercises, discussed which activities to do and which ones to avoid.  Increase activity slowly.  Follow-up again in 6 to 8 weeks social determinants of health including patient is visibly inactive secondary to age and isolation

## 2023-12-30 ENCOUNTER — Other Ambulatory Visit

## 2023-12-31 ENCOUNTER — Encounter: Payer: Self-pay | Admitting: Family Medicine

## 2024-01-01 ENCOUNTER — Encounter: Payer: Self-pay | Admitting: Family Medicine

## 2024-01-14 ENCOUNTER — Ambulatory Visit (INDEPENDENT_AMBULATORY_CARE_PROVIDER_SITE_OTHER): Admitting: Family Medicine

## 2024-01-14 ENCOUNTER — Ambulatory Visit (INDEPENDENT_AMBULATORY_CARE_PROVIDER_SITE_OTHER)

## 2024-01-14 VITALS — BP 148/72 | HR 87 | Ht 73.0 in

## 2024-01-14 DIAGNOSIS — M47816 Spondylosis without myelopathy or radiculopathy, lumbar region: Secondary | ICD-10-CM | POA: Diagnosis not present

## 2024-01-14 DIAGNOSIS — M25551 Pain in right hip: Secondary | ICD-10-CM

## 2024-01-14 DIAGNOSIS — M545 Low back pain, unspecified: Secondary | ICD-10-CM

## 2024-01-14 DIAGNOSIS — M25552 Pain in left hip: Secondary | ICD-10-CM

## 2024-01-14 DIAGNOSIS — M160 Bilateral primary osteoarthritis of hip: Secondary | ICD-10-CM | POA: Diagnosis not present

## 2024-01-14 DIAGNOSIS — M51362 Other intervertebral disc degeneration, lumbar region with discogenic back pain and lower extremity pain: Secondary | ICD-10-CM

## 2024-01-14 DIAGNOSIS — M1129 Other chondrocalcinosis, multiple sites: Secondary | ICD-10-CM | POA: Diagnosis not present

## 2024-01-14 DIAGNOSIS — M25559 Pain in unspecified hip: Secondary | ICD-10-CM | POA: Diagnosis not present

## 2024-01-14 DIAGNOSIS — M48061 Spinal stenosis, lumbar region without neurogenic claudication: Secondary | ICD-10-CM | POA: Diagnosis not present

## 2024-01-14 MED ORDER — TRAMADOL HCL 50 MG PO TABS: 50.0000 mg | ORAL_TABLET | Freq: Three times a day (TID) | ORAL | 0 refills | Status: DC | PRN

## 2024-01-14 NOTE — Assessment & Plan Note (Signed)
 No x-rays today.  Does have some midline tenderness over the sacrum itself and does have some chronic changes there that does make it difficult.  Patient did have some difficulty with urination today.  Do feel a CT scan is necessary to rule out any cauda equina.  Patient's deep tendon reflexes are symmetric patient also knows if there is any hematuria and to seek medical attention immediately.  Patient is accompanied with his wife.  Patient does have some fullness noted over the superior rami and we will monitor.  No bruising noted.  No voluntary guarding or rebound tenderness of the abdominal area noted.  Discussed if nausea or vomiting occur to seek medical attention immediately.  Once again awaiting CT scan to further evaluate
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
## 2024-01-14 NOTE — Patient Instructions (Signed)
 Watch bowel and bladder If worsening need to go to ED CT lumbar and pelvis wo [facility] Take Tramadol  with tylenol -Can take 2x a day See me again in 3 weeks

## 2024-01-15 ENCOUNTER — Encounter: Payer: Self-pay | Admitting: Family Medicine

## 2024-01-15 ENCOUNTER — Ambulatory Visit: Admitting: Family Medicine

## 2024-01-16 ENCOUNTER — Ambulatory Visit: Admitting: Physician Assistant

## 2024-01-16 ENCOUNTER — Ambulatory Visit

## 2024-01-18 ENCOUNTER — Encounter: Payer: Self-pay | Admitting: Family Medicine

## 2024-01-18 ENCOUNTER — Other Ambulatory Visit: Payer: Self-pay | Admitting: Family Medicine

## 2024-01-19 ENCOUNTER — Other Ambulatory Visit: Payer: Self-pay

## 2024-01-19 ENCOUNTER — Emergency Department (HOSPITAL_COMMUNITY)

## 2024-01-19 ENCOUNTER — Inpatient Hospital Stay (HOSPITAL_COMMUNITY): Admitting: Anesthesiology

## 2024-01-19 ENCOUNTER — Inpatient Hospital Stay (HOSPITAL_COMMUNITY)

## 2024-01-19 ENCOUNTER — Encounter: Payer: Self-pay | Admitting: Family Medicine

## 2024-01-19 ENCOUNTER — Encounter (HOSPITAL_COMMUNITY): Payer: Self-pay

## 2024-01-19 ENCOUNTER — Ambulatory Visit (HOSPITAL_BASED_OUTPATIENT_CLINIC_OR_DEPARTMENT_OTHER)

## 2024-01-19 ENCOUNTER — Inpatient Hospital Stay (HOSPITAL_COMMUNITY)
Admission: EM | Admit: 2024-01-19 | Discharge: 2024-01-24 | DRG: 522 | Disposition: A | Discharge status: Rehabilitation Facility | Attending: Internal Medicine | Admitting: Internal Medicine

## 2024-01-19 DIAGNOSIS — W19XXXA Unspecified fall, initial encounter: Secondary | ICD-10-CM | POA: Diagnosis not present

## 2024-01-19 DIAGNOSIS — Z7901 Long term (current) use of anticoagulants: Secondary | ICD-10-CM | POA: Diagnosis not present

## 2024-01-19 DIAGNOSIS — I4891 Unspecified atrial fibrillation: Secondary | ICD-10-CM | POA: Diagnosis not present

## 2024-01-19 DIAGNOSIS — E119 Type 2 diabetes mellitus without complications: Secondary | ICD-10-CM | POA: Diagnosis not present

## 2024-01-19 DIAGNOSIS — Z87891 Personal history of nicotine dependence: Secondary | ICD-10-CM | POA: Diagnosis not present

## 2024-01-19 DIAGNOSIS — G319 Degenerative disease of nervous system, unspecified: Secondary | ICD-10-CM | POA: Diagnosis not present

## 2024-01-19 DIAGNOSIS — I482 Chronic atrial fibrillation, unspecified: Secondary | ICD-10-CM | POA: Diagnosis present

## 2024-01-19 DIAGNOSIS — R630 Anorexia: Secondary | ICD-10-CM | POA: Diagnosis not present

## 2024-01-19 DIAGNOSIS — M4856XA Collapsed vertebra, not elsewhere classified, lumbar region, initial encounter for fracture: Secondary | ICD-10-CM | POA: Diagnosis not present

## 2024-01-19 DIAGNOSIS — S79919A Unspecified injury of unspecified hip, initial encounter: Secondary | ICD-10-CM | POA: Diagnosis not present

## 2024-01-19 DIAGNOSIS — I495 Sick sinus syndrome: Secondary | ICD-10-CM | POA: Diagnosis not present

## 2024-01-19 DIAGNOSIS — I951 Orthostatic hypotension: Secondary | ICD-10-CM | POA: Diagnosis not present

## 2024-01-19 DIAGNOSIS — M25551 Pain in right hip: Secondary | ICD-10-CM

## 2024-01-19 DIAGNOSIS — R296 Repeated falls: Secondary | ICD-10-CM | POA: Diagnosis not present

## 2024-01-19 DIAGNOSIS — R29890 Loss of height: Secondary | ICD-10-CM | POA: Diagnosis not present

## 2024-01-19 DIAGNOSIS — E785 Hyperlipidemia, unspecified: Secondary | ICD-10-CM | POA: Diagnosis not present

## 2024-01-19 DIAGNOSIS — Z7982 Long term (current) use of aspirin: Secondary | ICD-10-CM

## 2024-01-19 DIAGNOSIS — Z833 Family history of diabetes mellitus: Secondary | ICD-10-CM

## 2024-01-19 DIAGNOSIS — F05 Delirium due to known physiological condition: Secondary | ICD-10-CM | POA: Diagnosis not present

## 2024-01-19 DIAGNOSIS — M25461 Effusion, right knee: Secondary | ICD-10-CM | POA: Diagnosis not present

## 2024-01-19 DIAGNOSIS — S7290XA Unspecified fracture of unspecified femur, initial encounter for closed fracture: Secondary | ICD-10-CM | POA: Diagnosis present

## 2024-01-19 DIAGNOSIS — E876 Hypokalemia: Secondary | ICD-10-CM | POA: Diagnosis not present

## 2024-01-19 DIAGNOSIS — N3000 Acute cystitis without hematuria: Secondary | ICD-10-CM | POA: Diagnosis not present

## 2024-01-19 DIAGNOSIS — I10 Essential (primary) hypertension: Secondary | ICD-10-CM | POA: Diagnosis not present

## 2024-01-19 DIAGNOSIS — G8918 Other acute postprocedural pain: Secondary | ICD-10-CM | POA: Diagnosis not present

## 2024-01-19 DIAGNOSIS — R410 Disorientation, unspecified: Secondary | ICD-10-CM | POA: Diagnosis not present

## 2024-01-19 DIAGNOSIS — D62 Acute posthemorrhagic anemia: Secondary | ICD-10-CM | POA: Diagnosis not present

## 2024-01-19 DIAGNOSIS — H903 Sensorineural hearing loss, bilateral: Secondary | ICD-10-CM | POA: Diagnosis not present

## 2024-01-19 DIAGNOSIS — M109 Gout, unspecified: Secondary | ICD-10-CM | POA: Diagnosis present

## 2024-01-19 DIAGNOSIS — K5900 Constipation, unspecified: Secondary | ICD-10-CM | POA: Diagnosis not present

## 2024-01-19 DIAGNOSIS — I4892 Unspecified atrial flutter: Secondary | ICD-10-CM | POA: Diagnosis present

## 2024-01-19 DIAGNOSIS — K449 Diaphragmatic hernia without obstruction or gangrene: Secondary | ICD-10-CM | POA: Diagnosis not present

## 2024-01-19 DIAGNOSIS — Z96653 Presence of artificial knee joint, bilateral: Secondary | ICD-10-CM | POA: Diagnosis present

## 2024-01-19 DIAGNOSIS — H905 Unspecified sensorineural hearing loss: Secondary | ICD-10-CM | POA: Diagnosis not present

## 2024-01-19 DIAGNOSIS — Z85828 Personal history of other malignant neoplasm of skin: Secondary | ICD-10-CM

## 2024-01-19 DIAGNOSIS — S72001A Fracture of unspecified part of neck of right femur, initial encounter for closed fracture: Secondary | ICD-10-CM | POA: Diagnosis not present

## 2024-01-19 DIAGNOSIS — Z8249 Family history of ischemic heart disease and other diseases of the circulatory system: Secondary | ICD-10-CM | POA: Diagnosis not present

## 2024-01-19 DIAGNOSIS — S72091S Other fracture of head and neck of right femur, sequela: Secondary | ICD-10-CM | POA: Diagnosis not present

## 2024-01-19 DIAGNOSIS — E1159 Type 2 diabetes mellitus with other circulatory complications: Secondary | ICD-10-CM | POA: Diagnosis present

## 2024-01-19 DIAGNOSIS — I2510 Atherosclerotic heart disease of native coronary artery without angina pectoris: Secondary | ICD-10-CM

## 2024-01-19 DIAGNOSIS — Z471 Aftercare following joint replacement surgery: Secondary | ICD-10-CM | POA: Diagnosis not present

## 2024-01-19 DIAGNOSIS — M545 Low back pain, unspecified: Secondary | ICD-10-CM

## 2024-01-19 DIAGNOSIS — N200 Calculus of kidney: Secondary | ICD-10-CM | POA: Diagnosis not present

## 2024-01-19 DIAGNOSIS — R609 Edema, unspecified: Secondary | ICD-10-CM | POA: Diagnosis not present

## 2024-01-19 DIAGNOSIS — S72011A Unspecified intracapsular fracture of right femur, initial encounter for closed fracture: Principal | ICD-10-CM | POA: Diagnosis present

## 2024-01-19 DIAGNOSIS — N401 Enlarged prostate with lower urinary tract symptoms: Secondary | ICD-10-CM | POA: Diagnosis present

## 2024-01-19 DIAGNOSIS — R350 Frequency of micturition: Secondary | ICD-10-CM | POA: Diagnosis not present

## 2024-01-19 DIAGNOSIS — N390 Urinary tract infection, site not specified: Secondary | ICD-10-CM | POA: Diagnosis not present

## 2024-01-19 DIAGNOSIS — S32040A Wedge compression fracture of fourth lumbar vertebra, initial encounter for closed fracture: Secondary | ICD-10-CM | POA: Diagnosis not present

## 2024-01-19 DIAGNOSIS — Z882 Allergy status to sulfonamides status: Secondary | ICD-10-CM

## 2024-01-19 DIAGNOSIS — G4700 Insomnia, unspecified: Secondary | ICD-10-CM | POA: Diagnosis not present

## 2024-01-19 DIAGNOSIS — S72002A Fracture of unspecified part of neck of left femur, initial encounter for closed fracture: Secondary | ICD-10-CM | POA: Diagnosis not present

## 2024-01-19 DIAGNOSIS — Z794 Long term (current) use of insulin: Secondary | ICD-10-CM | POA: Diagnosis not present

## 2024-01-19 DIAGNOSIS — M79604 Pain in right leg: Secondary | ICD-10-CM | POA: Diagnosis not present

## 2024-01-19 DIAGNOSIS — W1830XS Fall on same level, unspecified, sequela: Secondary | ICD-10-CM | POA: Diagnosis not present

## 2024-01-19 DIAGNOSIS — S72464D Nondisplaced supracondylar fracture with intracondylar extension of lower end of right femur, subsequent encounter for closed fracture with routine healing: Secondary | ICD-10-CM | POA: Diagnosis not present

## 2024-01-19 DIAGNOSIS — Z6821 Body mass index (BMI) 21.0-21.9, adult: Secondary | ICD-10-CM | POA: Diagnosis not present

## 2024-01-19 DIAGNOSIS — M160 Bilateral primary osteoarthritis of hip: Secondary | ICD-10-CM | POA: Diagnosis not present

## 2024-01-19 DIAGNOSIS — Z9181 History of falling: Secondary | ICD-10-CM

## 2024-01-19 DIAGNOSIS — M5134 Other intervertebral disc degeneration, thoracic region: Secondary | ICD-10-CM | POA: Diagnosis not present

## 2024-01-19 DIAGNOSIS — M8580 Other specified disorders of bone density and structure, unspecified site: Secondary | ICD-10-CM | POA: Diagnosis not present

## 2024-01-19 DIAGNOSIS — Z79899 Other long term (current) drug therapy: Secondary | ICD-10-CM

## 2024-01-19 DIAGNOSIS — S199XXA Unspecified injury of neck, initial encounter: Secondary | ICD-10-CM | POA: Diagnosis not present

## 2024-01-19 DIAGNOSIS — R5381 Other malaise: Secondary | ICD-10-CM | POA: Diagnosis not present

## 2024-01-19 DIAGNOSIS — Z885 Allergy status to narcotic agent status: Secondary | ICD-10-CM

## 2024-01-19 DIAGNOSIS — S72044A Nondisplaced fracture of base of neck of right femur, initial encounter for closed fracture: Secondary | ICD-10-CM | POA: Diagnosis not present

## 2024-01-19 DIAGNOSIS — Z955 Presence of coronary angioplasty implant and graft: Secondary | ICD-10-CM

## 2024-01-19 DIAGNOSIS — S0990XA Unspecified injury of head, initial encounter: Secondary | ICD-10-CM | POA: Diagnosis not present

## 2024-01-19 DIAGNOSIS — W010XXA Fall on same level from slipping, tripping and stumbling without subsequent striking against object, initial encounter: Secondary | ICD-10-CM | POA: Diagnosis present

## 2024-01-19 DIAGNOSIS — K219 Gastro-esophageal reflux disease without esophagitis: Secondary | ICD-10-CM | POA: Diagnosis present

## 2024-01-19 DIAGNOSIS — Z8261 Family history of arthritis: Secondary | ICD-10-CM

## 2024-01-19 DIAGNOSIS — R338 Other retention of urine: Secondary | ICD-10-CM | POA: Diagnosis not present

## 2024-01-19 DIAGNOSIS — S72001S Fracture of unspecified part of neck of right femur, sequela: Secondary | ICD-10-CM | POA: Diagnosis not present

## 2024-01-19 DIAGNOSIS — S72041A Displaced fracture of base of neck of right femur, initial encounter for closed fracture: Secondary | ICD-10-CM | POA: Diagnosis not present

## 2024-01-19 DIAGNOSIS — Z888 Allergy status to other drugs, medicaments and biological substances status: Secondary | ICD-10-CM

## 2024-01-19 DIAGNOSIS — Z96641 Presence of right artificial hip joint: Secondary | ICD-10-CM | POA: Diagnosis not present

## 2024-01-19 DIAGNOSIS — S32049A Unspecified fracture of fourth lumbar vertebra, initial encounter for closed fracture: Secondary | ICD-10-CM | POA: Diagnosis not present

## 2024-01-19 LAB — BASIC METABOLIC PANEL WITH GFR
Anion gap: 13 (ref 5–15)
BUN: 18 mg/dL (ref 8–23)
CO2: 22 mmol/L (ref 22–32)
Calcium: 9.7 mg/dL (ref 8.9–10.3)
Chloride: 104 mmol/L (ref 98–111)
Creatinine, Ser: 0.85 mg/dL (ref 0.61–1.24)
GFR, Estimated: >60 mL/min (ref >60)
Glucose, Bld: 132 mg/dL — ABNORMAL HIGH (ref 70–99)
Potassium: 3.3 mmol/L — ABNORMAL LOW (ref 3.5–5.1)
Sodium: 139 mmol/L (ref 135–145)

## 2024-01-19 LAB — URINALYSIS, ROUTINE W REFLEX MICROSCOPIC
Bacteria, UA: NONE SEEN
Bilirubin Urine: NEGATIVE
Glucose, UA: NEGATIVE mg/dL
Ketones, ur: 20 mg/dL — AB
Leukocytes,Ua: NEGATIVE
Nitrite: NEGATIVE
Protein, ur: NEGATIVE mg/dL
RBC / HPF: >50 RBC/hpf (ref 0–5)
Specific Gravity, Urine: 1.035 — ABNORMAL HIGH (ref 1.005–1.030)
pH: 5 (ref 5.0–8.0)

## 2024-01-19 LAB — CBC
HCT: 41.9 % (ref 39.0–52.0)
Hemoglobin: 13.7 g/dL (ref 13.0–17.0)
MCH: 31.4 pg (ref 26.0–34.0)
MCHC: 32.7 g/dL (ref 30.0–36.0)
MCV: 96.1 fL (ref 80.0–100.0)
Platelets: 188 10*3/uL (ref 150–400)
RBC: 4.36 MIL/uL (ref 4.22–5.81)
RDW: 14.6 % (ref 11.5–15.5)
WBC: 5.9 10*3/uL (ref 4.0–10.5)
nRBC: 0 % (ref 0.0–0.2)

## 2024-01-19 MED ORDER — FENTANYL CITRATE (PF) 100 MCG/2ML IJ SOLN: 50.0000 ug | Freq: Once | INTRAMUSCULAR | Status: DC

## 2024-01-19 MED ORDER — MORPHINE SULFATE (PF) 2 MG/ML IV SOLN
2.0000 mg | Freq: Once | INTRAVENOUS | Status: AC
  Administered 2024-01-19: 2 mg via INTRAVENOUS
  Filled 2024-01-19: qty 1

## 2024-01-19 MED ORDER — LORAZEPAM 1 MG PO TABS
1.0000 mg | ORAL_TABLET | Freq: Once | ORAL | Status: AC
  Administered 2024-01-19: 1 mg via ORAL
  Filled 2024-01-19: qty 1

## 2024-01-19 MED ORDER — AMLODIPINE BESYLATE 2.5 MG PO TABS: 2.5000 mg | ORAL_TABLET | Freq: Every day | ORAL | Status: DC

## 2024-01-19 MED ORDER — ROSUVASTATIN CALCIUM 5 MG PO TABS
10.0000 mg | ORAL_TABLET | Freq: Every day | ORAL | Status: DC
  Administered 2024-01-19 – 2024-01-24 (×5): 10 mg via ORAL
  Filled 2024-01-19 (×5): qty 2

## 2024-01-19 MED ORDER — BUPIVACAINE-EPINEPHRINE (PF) 0.5% -1:200000 IJ SOLN
INTRAMUSCULAR | Status: DC | PRN
Start: 2024-01-19 — End: 2024-01-19
  Administered 2024-01-19: 30 mL

## 2024-01-19 MED ORDER — FENTANYL CITRATE (PF) 100 MCG/2ML IJ SOLN
INTRAMUSCULAR | Status: AC
  Administered 2024-01-19: 50 ug
  Filled 2024-01-19: qty 2

## 2024-01-19 MED ORDER — POLYETHYLENE GLYCOL 3350 17 G PO PACK: 17.0000 g | PACK | Freq: Every day | ORAL | Status: DC | PRN

## 2024-01-19 MED ORDER — MORPHINE SULFATE (PF) 2 MG/ML IV SOLN
1.0000 mg | INTRAVENOUS | Status: DC | PRN
  Administered 2024-01-19 – 2024-01-20 (×2): 1 mg via INTRAVENOUS
  Filled 2024-01-19 (×3): qty 1

## 2024-01-19 MED ORDER — ACETAMINOPHEN 500 MG PO TABS
1000.0000 mg | ORAL_TABLET | Freq: Once | ORAL | Status: AC
  Administered 2024-01-19: 1000 mg via ORAL
  Filled 2024-01-19: qty 2

## 2024-01-19 MED ORDER — DULOXETINE HCL 20 MG PO CPEP
20.0000 mg | ORAL_CAPSULE | Freq: Every day | ORAL | Status: DC
  Administered 2024-01-19 – 2024-01-24 (×5): 20 mg via ORAL
  Filled 2024-01-19 (×6): qty 1

## 2024-01-19 MED ORDER — IOHEXOL 350 MG/ML SOLN
75.0000 mL | Freq: Once | INTRAVENOUS | Status: AC | PRN
  Administered 2024-01-19: 75 mL via INTRAVENOUS

## 2024-01-19 MED ORDER — LISINOPRIL 20 MG PO TABS
40.0000 mg | ORAL_TABLET | Freq: Every day | ORAL | Status: DC
  Filled 2024-01-19: qty 2

## 2024-01-19 MED ORDER — AMLODIPINE BESYLATE 2.5 MG PO TABS
2.5000 mg | ORAL_TABLET | Freq: Every day | ORAL | Status: DC
  Administered 2024-01-19 – 2024-01-24 (×5): 2.5 mg via ORAL
  Filled 2024-01-19 (×6): qty 1

## 2024-01-19 MED ORDER — DULOXETINE HCL 20 MG PO CPEP: 20.0000 mg | ORAL_CAPSULE | Freq: Every day | ORAL | Status: DC

## 2024-01-19 NOTE — Progress Notes (Signed)
 Transported to PACU from 5N 32 via bed, wife accompanied patient to PACU. Vital signs stable upon arrival to PACU. Dr. Jonne Netters at bedside to perform block for patients comfort.
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
## 2024-01-19 NOTE — Anesthesia Procedure Notes (Signed)
 Anesthesia Regional Block: Peng block   Pre-Anesthetic Checklist: , timeout performed,  Correct Patient, Correct Site, Correct Laterality,  Correct Procedure, Correct Position, site marked,  Risks and benefits discussed,  Surgical consent,  Pre-op evaluation,  At surgeon's request and post-op pain management  Laterality: Right and Lower  Prep: chloraprep       Needles:  Injection technique: Single-shot  Needle Type: Echogenic Needle     Needle Length: 9cm  Needle Gauge: 21     Additional Needles:   Procedures:,,,, ultrasound used (permanent image in chart),,    Narrative:  Start time: 01/19/2024 9:41 PM End time: 01/19/2024 9:50 PM Injection made incrementally with aspirations every 5 mL.  Performed by: Personally  Anesthesiologist: Jonne Netters, MD  Additional Notes: Pt identified in Holding room.  Monitors applied. Working IV access confirmed. Timeout, Sterile prep R ASIS and inguinal crease.  #21ga ECHOgenic Arrow block needle to PENG with US  guidance.  30cc 0.5% Bupivacaine  1:200k epi injected incrementally after negative test dose.  Patient asymptomatic, VSS, no heme aspirated, tolerated well.   Fay Hoop, MD

## 2024-01-19 NOTE — ED Triage Notes (Addendum)
 Patient arrives via [facility] for a fall, patient is on xarelto . Patient has hx of afib. No LOC. Patient did not hit head. Right elbow lac, bleeding controlled. Previous back and hip injury on left side, upon EMS assessment no sign of fracture. Patient endorses landing on right hip with pain. GCS 15. Patient arrives in a c collar. Patient endorses being off balance, "couldn't get his fit in line." Patient endorses hitting the counter top.   CBG 146 VSS.

## 2024-01-19 NOTE — H&P (Signed)
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 difficulty reaching the provider, please page the [facility] (Director on Call) for the Hospitalists listed on amion for assistance.  01/19/2024, 2:40 PM

## 2024-01-19 NOTE — Progress Notes (Signed)
 [facility] Ortho tech states trapeze bars are not given to any patient over 88 years old.
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
## 2024-01-19 NOTE — Progress Notes (Signed)
 Patietn arrived in PACU at 2123, Dr. Cleora Daft spoke with patienbt and spouse at bedside at 2130 and consent was signed.Time out performed at 2130 and procedure start time was 2141. Patient tolerated procedure well, vital signs remained stable. End time for procedure was 2146. Aaron Aas

## 2024-01-19 NOTE — ED Notes (Signed)
 Went to CT

## 2024-01-19 NOTE — Anesthesia Preprocedure Evaluation (Addendum)
 Anesthesia Evaluation  Patient identified by MRN, date of birth, ID band Patient confused    Reviewed: Allergy & Precautions, NPO status , Patient's Chart, lab work & pertinent test results, Unable to perform ROS - Chart review only  History of Anesthesia Complications Negative for: history of anesthetic complications  Airway Mallampati: II  TM Distance: >3 FB Neck ROM: Full    Dental  (+) Chipped, Dental Advisory Given   Pulmonary former smoker   breath sounds clear to auscultation       Cardiovascular hypertension, Pt. on medications (-) angina + CAD and + Cardiac Stents  + dysrhythmias Atrial Fibrillation  Rhythm:Irregular Rate:Normal  '18 cath:  Small, non-dominant RCA: Prox RCA lesion, 70 %stenosed. Mid RCA lesion, 70 %stenosed. Prox LAD to Mid LAD lesion, 50 %stenosed - calicified/irregular. Mid LAD lesion, 40 %stenosed. Prox Cx to Mid Cx overlapping Cypeher DES (from 11/2007), 0 %stenosed. The left ventricular systolic function is normal. The left ventricular ejection fraction is 55-65% by visual estimate.   False Positive Nuclear Stress Test  '15 ECHO: - Left ventricle: The cavity size was normal. Systolic    function was normal. The estimated ejection fraction was    in the range of 55% to 60%. Wall motion was normal; there    were no regional wall motion abnormalities.  - Mitral valve: Mild regurgitation.  - Left atrium: The atrium was mildly dilated.  - Atrial septum: No defect or patent foramen ovale was    identified.      Neuro/Psych  Headaches    GI/Hepatic Neg liver ROS, PUD,GERD  Controlled,,  Endo/Other  diabetes    Renal/GU stones     Musculoskeletal   Abdominal   Peds  Hematology Xarelto  Hb 13.7, plt 188k   Anesthesia Other Findings   Reproductive/Obstetrics                             Anesthesia Physical Anesthesia Plan  ASA: 3  Anesthesia Plan:     Post-op Pain Management: Regional block*   Induction:   PONV Risk Score and Plan:   Airway Management Planned: Nasal Cannula  Additional Equipment:   Intra-op Plan:   Post-operative Plan:   Informed Consent: I have reviewed the patients History and Physical, chart, labs and discussed the procedure including the risks, benefits and alternatives for the proposed anesthesia with the patient or authorized representative who has indicated his/her understanding and acceptance.     Consent reviewed with POA  Plan Discussed with: Surgeon  Anesthesia Plan Comments: (Plan routine monitors, PENG block for analgesia, consent from patient and his wife)        Anesthesia Quick Evaluation

## 2024-01-19 NOTE — Progress Notes (Signed)
 Patient tolerated procedure well. Dr, Jonne Netters at bedside assessing patient at this time and states  "he is ok to go back to his room now". Patient transported back to [address] and met by primary nurse Justine Oms, RN. Patient in no distress with no complaints of pain. Patient's wife remained at his side. She was appreciative of care he received.

## 2024-01-19 NOTE — ED Provider Notes (Signed)
 [name] AT [facility] Provider Note   CSN: [id number] Arrival date & time: 01/19/24  0801     History  Chief Complaint  Patient presents with   Sean Lane is a 89 y.o. male.  Sean Lane is 89 y.o. with pertinent PMHx of atrial flutter on Xarelto , multiple recent falls, type 2 diabetes, hypertension, CAD who presents today after fall at home.  History provided by patient and wife.  Patient reports that he was leaning over the counter when he lost his balance and fell onto his right side.  Landed on his right hip.  This is where he has most pain. He did not lose consciousness, he did not hit his head.  Recently saw PCP after fall onto back last week.  Pelvic and lumbar x-rays at this time significant for endplate compression deformity, but no pelvic fracture. He has no numbness or tingling, no weakness, no neck pain.   The history is provided by the patient and the spouse.       Home Medications Prior to Admission medications   Medication Sig Start Date End Date Taking? Authorizing Provider  ACCU-CHEK GUIDE test strip USE TO TEST ONCE DAILY. DX: E11.9 05/22/23   Almira Jaeger, MD  Accu-Chek Softclix Lancets lancets USE TO CHECK BLOOD SUGAR ONCE DAILY. Dx: E11.9 11/09/22   Almira Jaeger, MD  acetaminophen  (TYLENOL ) 650 MG CR tablet Take 1,300 mg by mouth 3 (three) times daily.    [provider]  amLODipine  (NORVASC ) 2.5 MG tablet Take 1 tablet (2.5 mg total) by mouth daily. 10/30/23   Almira Jaeger, MD  aspirin  EC 81 MG tablet Take 81 mg by mouth daily.    [provider]  Blood Glucose Monitoring Suppl (ACCU-CHEK GUIDE ME) w/Device KIT Use to test blood sugars once daily. Dx: E11.9. Last meter supplied over 5 years ago- please give patient cash pay price if this is not covered or send us  the script you need and we will sign. 10/13/21   Almira Jaeger, MD  calcium  carbonate (TUMS - DOSED IN MG  ELEMENTAL CALCIUM ) 500 MG chewable tablet Chew 2 tablets (400 mg of elemental calcium  total) by mouth daily as needed for indigestion or heartburn. 03/28/23   Andrez Banker, MD  cholecalciferol (VITAMIN D3) 25 MCG (1000 UNIT) tablet Take 2,000 Units by mouth daily.    [provider]  clobetasol  (OLUX ) 0.05 % topical foam Apply topically daily. Patient taking differently: Apply 1 Application topically daily as needed (itchy scalp). 11/23/21   Devon Fogo, MD  DULoxetine  (CYMBALTA ) 20 MG capsule TAKE 1 CAPSULE DAILY 11/17/23   Isidro Margo, DO  dutasteride  (AVODART ) 0.5 MG capsule Take 0.5 mg by mouth daily. 10/26/23   [provider]  Emollient (GOLD BOND DIABETICS DRY SKIN) CREA Apply 1 Application topically daily as needed (dry skin).    [provider]  furosemide  (LASIX ) 20 MG tablet TAKE 1 TABLET BY MOUTH EVERY DAY AS NEEDED 08/30/23   Almira Jaeger, MD  hydrALAZINE  (APRESOLINE ) 10 MG tablet Take 1 tablet (10 mg total) by mouth as needed (for SBP over 180). 08/09/22   Nishan, Peter C, MD  lisinopril  (ZESTRIL ) 40 MG tablet Take 1 tablet (40 mg total) by mouth daily. 07/20/23   Almira Jaeger, MD  LORazepam  (ATIVAN ) 0.5 MG tablet Take 0.5-1 tablets (0.25-0.5 mg total) by mouth daily as needed for anxiety (30 minutes before  MRI. do not drive for 8 hours after takings). 11/29/23   Almira Jaeger, MD  Naphazoline-Pheniramine (OPCON-A OP) Apply 1 drop to eye daily as needed (allergies).    [provider]  nitroGLYCERIN  (NITROSTAT ) 0.4 MG SL tablet Place 1 tablet (0.4 mg total) under the tongue every 5 (five) minutes as needed for chest pain (for chest pain). Use up to 3 dosages, if no relief call 911. 03/31/22   Almira Jaeger, MD  rivaroxaban  (XARELTO ) 10 MG TABS tablet Take 1 tablet (10 mg total) by mouth daily. 09/07/23   Nishan, Peter C, MD  rosuvastatin  (CRESTOR ) 10 MG tablet TAKE 1 TABLET DAILY 02/08/23   Almira Jaeger, MD  traMADol   (ULTRAM ) 50 MG tablet TAKE 1 TABLET (50 MG TOTAL) BY MOUTH EVERY 8 (EIGHT) HOURS AS NEEDED FOR UP TO 5 DAYS. 01/18/24 01/23/24  Smith, Zachary M, DO  triamcinolone  cream (KENALOG ) 0.1 % Apply 1 Application topically 2 (two) times daily. For 7-10 days maximum for rash on ankle 11/27/23   Almira Jaeger, MD      Allergies    Hydromorphone hcl, Ketoconazole , and Sulfonamide derivatives    Review of Systems   Review of Systems  Physical Exam Updated Vital Signs BP (!) 148/78 (BP Location: Right Arm)   Pulse 80   Temp 98.1 F (36.7 C) (Oral)   Resp 16   Ht 6\' 1"  (1.854 m)   Wt 75.3 kg   SpO2 100%   BMI 21.90 kg/m  Physical Exam Vitals and nursing note reviewed.  Constitutional:      General: He is not in acute distress.    Appearance: Normal appearance.     Comments: Frail appearing  HENT:     Head: Normocephalic and atraumatic.     Nose: Nose normal.  Neck:     Comments: C-collar in place Cardiovascular:     Rate and Rhythm: Normal rate and regular rhythm.     Pulses: Normal pulses.     Heart sounds: Normal heart sounds.  Pulmonary:     Effort: Pulmonary effort is normal.     Breath sounds: Normal breath sounds.  Abdominal:     General: Abdomen is flat.     Palpations: Abdomen is soft.  Musculoskeletal:     Comments: Strength 5/5 bilateral upper and lower extremities Mildly tender to palpation right posterior superior iliac crest, no obvious bruising across pelvis or abdomen or flanks  Skin:    General: Skin is warm and dry.  Neurological:     General: No focal deficit present.     Mental Status: He is alert and oriented to person, place, and time.  Psychiatric:        Mood and Affect: Mood normal.     ED Results / Procedures / Treatments   Labs (all labs ordered are listed, but only abnormal results are displayed) Labs Reviewed  CBC    EKG None  Radiology No results found.  Procedures Procedures    Medications Ordered in ED Medications  morphine   (PF) 2 MG/ML injection 2 mg (has no administration in time range)    ED Course/ Medical Decision Making/ A&P Clinical Course as of 01/19/24 1324  Sat Jan 19, 2024  1149 CT abdomen pelvis with contrast, CT lumbar spine reviewed and notable for known acute compression fracture of L4, with new nondisplaced right femoral neck fracture. Placed consult to orthopedic surgery. CBC reviewed with Hemoglobin stable. [MQ]  1155 There is updated patient had  bladder scan greater than 350, reportedly has had urinary issues in the past.  CT abdomen pelvis also notable for left renal stone with BPH and additional prostate calcifications.  Will place Foley at this time for retention.  UA ordered. [MQ]  1242 Dr. Martina Sledge spoke with Dr. Adonica Hoose, Orthopedic Surgery. Recommended medical admission, with potential for ORIF tomorrow AM. Requested to continue to get CT non-contrast of the pelvis. [MQ]  1311 Spoke to admitting team FMTS Dr. Fernand Howard who will admit the patient. [MQ]    Clinical Course User Index [MQ] Ivin Marrow, MD                                 Medical Decision Making Patient presents today after mechanical fall ground-level onto right hip, while on blood thinners.  On arrival vital signs stable, no acute signs of bleeding.  No obvious deformities or fractures.  Patient has had multiple falls in the past week scheduled for CT pelvis and lumbar spine this afternoon.  Previous lumbar and pelvic x-rays reviewed from May 5, with significant findings for endplate compression fracture.  Given new follow-up evaluate for further pelvic fracture with CT pelvis and lumbar spine.  Given high risk of bleeding on aspirin  and Xarelto  will get CT head without contrast and CT neck without contrast.  Awaiting CBC.  2 mg of morphine  ordered for pain control.   See ED course above for further MDM regarding clinical course. Patient is hemodynamically stable, awaiting admission from primary team.  Amount and/or  Complexity of Data Reviewed Independent Historian: spouse Labs: ordered. Radiology: ordered.  Risk OTC drugs. Prescription drug management. Decision regarding hospitalization.          Final Clinical Impression(s) / ED Diagnoses Final diagnoses:  None    Rx / DC Orders ED Discharge Orders     None         Ivin Marrow, MD 01/19/24 1326    Teddi Favors, DO 01/23/24 [phone]

## 2024-01-19 NOTE — Plan of Care (Signed)
  Problem: Pain Managment: Goal: General experience of comfort will improve and/or be controlled Outcome: Progressing

## 2024-01-20 ENCOUNTER — Inpatient Hospital Stay (HOSPITAL_COMMUNITY)

## 2024-01-20 ENCOUNTER — Encounter (HOSPITAL_COMMUNITY)
Admission: EM | Disposition: A | Discharge status: Rehabilitation Facility | Payer: Self-pay | Source: Home / Self Care | Attending: Family Medicine

## 2024-01-20 ENCOUNTER — Other Ambulatory Visit: Payer: Self-pay

## 2024-01-20 ENCOUNTER — Inpatient Hospital Stay (HOSPITAL_COMMUNITY): Admitting: Anesthesiology

## 2024-01-20 ENCOUNTER — Encounter (HOSPITAL_COMMUNITY): Payer: Self-pay | Admitting: Internal Medicine

## 2024-01-20 DIAGNOSIS — I4891 Unspecified atrial fibrillation: Secondary | ICD-10-CM

## 2024-01-20 DIAGNOSIS — S72464D Nondisplaced supracondylar fracture with intracondylar extension of lower end of right femur, subsequent encounter for closed fracture with routine healing: Secondary | ICD-10-CM | POA: Diagnosis not present

## 2024-01-20 DIAGNOSIS — I2510 Atherosclerotic heart disease of native coronary artery without angina pectoris: Secondary | ICD-10-CM

## 2024-01-20 DIAGNOSIS — S72001A Fracture of unspecified part of neck of right femur, initial encounter for closed fracture: Secondary | ICD-10-CM

## 2024-01-20 DIAGNOSIS — I10 Essential (primary) hypertension: Secondary | ICD-10-CM

## 2024-01-20 HISTORY — PX: ANTERIOR APPROACH HEMI HIP ARTHROPLASTY: SHX6690

## 2024-01-20 LAB — CBC
HCT: 41.9 % (ref 39.0–52.0)
Hemoglobin: 14 g/dL (ref 13.0–17.0)
MCH: 31 pg (ref 26.0–34.0)
MCHC: 33.4 g/dL (ref 30.0–36.0)
MCV: 92.9 fL (ref 80.0–100.0)
Platelets: 186 10*3/uL (ref 150–400)
RBC: 4.51 MIL/uL (ref 4.22–5.81)
RDW: 14.6 % (ref 11.5–15.5)
WBC: 7.2 10*3/uL (ref 4.0–10.5)
nRBC: 0 % (ref 0.0–0.2)

## 2024-01-20 LAB — BASIC METABOLIC PANEL WITH GFR
Anion gap: 12 (ref 5–15)
BUN: 16 mg/dL (ref 8–23)
CO2: 21 mmol/L — ABNORMAL LOW (ref 22–32)
Calcium: 9.7 mg/dL (ref 8.9–10.3)
Chloride: 105 mmol/L (ref 98–111)
Creatinine, Ser: 0.93 mg/dL (ref 0.61–1.24)
GFR, Estimated: >60 mL/min (ref >60)
Glucose, Bld: 147 mg/dL — ABNORMAL HIGH (ref 70–99)
Potassium: 3.6 mmol/L (ref 3.5–5.1)
Sodium: 138 mmol/L (ref 135–145)

## 2024-01-20 LAB — GLUCOSE, CAPILLARY: Glucose-Capillary: 174 mg/dL — ABNORMAL HIGH (ref 70–99)

## 2024-01-20 LAB — TYPE AND SCREEN
ABO/RH(D): O POS
Antibody Screen: NEGATIVE

## 2024-01-20 LAB — SURGICAL PCR SCREEN
MRSA, PCR: NEGATIVE
Staphylococcus aureus: NEGATIVE

## 2024-01-20 SURGERY — HEMIARTHROPLASTY, HIP, DIRECT ANTERIOR APPROACH, FOR FRACTURE
Anesthesia: General | Laterality: Right

## 2024-01-20 MED ORDER — OXYCODONE HCL 5 MG PO TABS: 5.0000 mg | ORAL_TABLET | Freq: Once | ORAL | Status: DC | PRN

## 2024-01-20 MED ORDER — ONDANSETRON HCL 4 MG/2ML IJ SOLN
INTRAMUSCULAR | Status: AC
  Filled 2024-01-20: qty 2

## 2024-01-20 MED ORDER — DEXMEDETOMIDINE HCL IN NACL 80 MCG/20ML IV SOLN
INTRAVENOUS | Status: DC | PRN
  Administered 2024-01-20: 12 ug via INTRAVENOUS

## 2024-01-20 MED ORDER — FENTANYL CITRATE (PF) 100 MCG/2ML IJ SOLN: 25.0000 ug | INTRAMUSCULAR | Status: DC | PRN

## 2024-01-20 MED ORDER — 0.9 % SODIUM CHLORIDE (POUR BTL) OPTIME
TOPICAL | Status: DC | PRN
  Administered 2024-01-20: 1000 mL

## 2024-01-20 MED ORDER — SUGAMMADEX SODIUM 200 MG/2ML IV SOLN
INTRAVENOUS | Status: DC | PRN
  Administered 2024-01-20: 200 mg via INTRAVENOUS

## 2024-01-20 MED ORDER — OXYCODONE HCL 5 MG/5ML PO SOLN: 5.0000 mg | Freq: Once | ORAL | Status: DC | PRN

## 2024-01-20 MED ORDER — FENTANYL CITRATE (PF) 250 MCG/5ML IJ SOLN
INTRAMUSCULAR | Status: AC
  Filled 2024-01-20: qty 5

## 2024-01-20 MED ORDER — HYDROCODONE-ACETAMINOPHEN 5-325 MG PO TABS
1.0000 | ORAL_TABLET | ORAL | Status: DC | PRN
  Administered 2024-01-24 (×2): 2 via ORAL
  Filled 2024-01-20 (×2): qty 2

## 2024-01-20 MED ORDER — CEFAZOLIN SODIUM-DEXTROSE 2-3 GM-%(50ML) IV SOLR
INTRAVENOUS | Status: DC | PRN
  Administered 2024-01-20: 2 g via INTRAVENOUS

## 2024-01-20 MED ORDER — ORAL CARE MOUTH RINSE: 15.0000 mL | Freq: Once | OROMUCOSAL | Status: DC

## 2024-01-20 MED ORDER — DEXAMETHASONE SODIUM PHOSPHATE 10 MG/ML IJ SOLN
INTRAMUSCULAR | Status: AC
  Filled 2024-01-20: qty 1

## 2024-01-20 MED ORDER — LACTATED RINGERS IV SOLN: INTRAVENOUS | Status: DC | PRN

## 2024-01-20 MED ORDER — LORAZEPAM 2 MG/ML IJ SOLN
0.5000 mg | Freq: Once | INTRAMUSCULAR | Status: AC
  Administered 2024-01-20: 0.5 mg via INTRAVENOUS
  Filled 2024-01-20: qty 1

## 2024-01-20 MED ORDER — PHENYLEPHRINE HCL-NACL 20-0.9 MG/250ML-% IV SOLN: INTRAVENOUS | Status: DC | PRN

## 2024-01-20 MED ORDER — CEFAZOLIN SODIUM-DEXTROSE 2-4 GM/100ML-% IV SOLN
INTRAVENOUS | Status: AC
  Administered 2024-01-21: 2000 mg
  Filled 2024-01-20: qty 100

## 2024-01-20 MED ORDER — PRONTOSAN WOUND IRRIGATION OPTIME
TOPICAL | Status: DC | PRN
  Administered 2024-01-20: 350 mL

## 2024-01-20 MED ORDER — HYDROCODONE-ACETAMINOPHEN 7.5-325 MG PO TABS
1.0000 | ORAL_TABLET | ORAL | Status: DC | PRN
Start: 2024-01-20 — End: 2024-01-24
  Administered 2024-01-21: 1 via ORAL
  Administered 2024-01-22: 2 via ORAL
  Administered 2024-01-22 – 2024-01-23 (×5): 1 via ORAL
  Filled 2024-01-20 (×4): qty 1
  Filled 2024-01-20 (×2): qty 2
  Filled 2024-01-20 (×2): qty 1

## 2024-01-20 MED ORDER — DEXAMETHASONE SODIUM PHOSPHATE 10 MG/ML IJ SOLN
INTRAMUSCULAR | Status: DC | PRN
  Administered 2024-01-20: 10 mg via INTRAVENOUS

## 2024-01-20 MED ORDER — CHLORHEXIDINE GLUCONATE 0.12 % MT SOLN: 15.0000 mL | Freq: Once | OROMUCOSAL | Status: DC

## 2024-01-20 MED ORDER — KETAMINE HCL 50 MG/5ML IJ SOSY
PREFILLED_SYRINGE | INTRAMUSCULAR | Status: AC
  Filled 2024-01-20: qty 5

## 2024-01-20 MED ORDER — ONDANSETRON HCL 4 MG/2ML IJ SOLN
INTRAMUSCULAR | Status: DC | PRN
  Administered 2024-01-20: 4 mg via INTRAVENOUS

## 2024-01-20 MED ORDER — TRANEXAMIC ACID-NACL 1000-0.7 MG/100ML-% IV SOLN
1000.0000 mg | Freq: Once | INTRAVENOUS | Status: AC
  Administered 2024-01-21: 1000 mg via INTRAVENOUS
  Filled 2024-01-20: qty 100

## 2024-01-20 MED ORDER — ACETAMINOPHEN 500 MG PO TABS
500.0000 mg | ORAL_TABLET | Freq: Four times a day (QID) | ORAL | Status: DC
  Administered 2024-01-21: 500 mg via ORAL
  Filled 2024-01-20 (×2): qty 1

## 2024-01-20 MED ORDER — VASOPRESSIN 20 UNIT/ML IV SOLN
INTRAVENOUS | Status: DC | PRN
  Administered 2024-01-20: 2 [IU] via INTRAVENOUS

## 2024-01-20 MED ORDER — ACETAMINOPHEN 10 MG/ML IV SOLN
1000.0000 mg | Freq: Once | INTRAVENOUS | Status: DC | PRN
  Administered 2024-01-20: 1000 mg via INTRAVENOUS

## 2024-01-20 MED ORDER — SENNA 8.6 MG PO TABS
1.0000 | ORAL_TABLET | Freq: Two times a day (BID) | ORAL | Status: DC
  Administered 2024-01-20 – 2024-01-24 (×7): 8.6 mg via ORAL
  Filled 2024-01-20 (×8): qty 1

## 2024-01-20 MED ORDER — ONDANSETRON HCL 4 MG/2ML IJ SOLN: 4.0000 mg | Freq: Four times a day (QID) | INTRAMUSCULAR | Status: DC | PRN

## 2024-01-20 MED ORDER — FENTANYL CITRATE (PF) 100 MCG/2ML IJ SOLN
25.0000 ug | INTRAMUSCULAR | Status: DC | PRN
  Administered 2024-01-20: 50 ug via INTRAVENOUS

## 2024-01-20 MED ORDER — PHENOL 1.4 % MT LIQD: 1.0000 | OROMUCOSAL | Status: DC | PRN

## 2024-01-20 MED ORDER — DROPERIDOL 2.5 MG/ML IJ SOLN: 0.6250 mg | Freq: Once | INTRAMUSCULAR | Status: DC | PRN

## 2024-01-20 MED ORDER — SODIUM CHLORIDE (PF) 0.9 % IJ SOLN
INTRAMUSCULAR | Status: DC | PRN
  Administered 2024-01-20: 30 mL

## 2024-01-20 MED ORDER — ACETAMINOPHEN 10 MG/ML IV SOLN
INTRAVENOUS | Status: AC
  Filled 2024-01-20: qty 100

## 2024-01-20 MED ORDER — METOCLOPRAMIDE HCL 5 MG PO TABS: 5.0000 mg | ORAL_TABLET | Freq: Three times a day (TID) | ORAL | Status: DC | PRN

## 2024-01-20 MED ORDER — VASOPRESSIN 20 UNIT/ML IV SOLN
INTRAVENOUS | Status: AC
  Filled 2024-01-20: qty 1

## 2024-01-20 MED ORDER — MENTHOL 3 MG MT LOZG: 1.0000 | LOZENGE | OROMUCOSAL | Status: DC | PRN

## 2024-01-20 MED ORDER — SODIUM CHLORIDE 0.9 % IV SOLN: INTRAVENOUS | Status: AC

## 2024-01-20 MED ORDER — PHENYLEPHRINE HCL-NACL 20-0.9 MG/250ML-% IV SOLN
INTRAVENOUS | Status: DC | PRN
  Administered 2024-01-20: 50 ug/min via INTRAVENOUS

## 2024-01-20 MED ORDER — LIDOCAINE 2% (20 MG/ML) 5 ML SYRINGE
INTRAMUSCULAR | Status: DC | PRN
  Administered 2024-01-20: 80 mg via INTRAVENOUS

## 2024-01-20 MED ORDER — MIDAZOLAM HCL 2 MG/2ML IJ SOLN
INTRAMUSCULAR | Status: AC
Start: 2024-01-20 — End: ?
  Filled 2024-01-20: qty 2

## 2024-01-20 MED ORDER — SODIUM CHLORIDE 0.9 % IV SOLN
INTRAVENOUS | Status: AC | PRN
  Administered 2024-01-20: 1000 mL

## 2024-01-20 MED ORDER — FENTANYL CITRATE (PF) 250 MCG/5ML IJ SOLN
INTRAMUSCULAR | Status: DC | PRN
Start: 2024-01-20 — End: 2024-01-20
  Administered 2024-01-20 (×3): 50 ug via INTRAVENOUS

## 2024-01-20 MED ORDER — ALBUMIN HUMAN 5 % IV SOLN: INTRAVENOUS | Status: DC | PRN

## 2024-01-20 MED ORDER — DOCUSATE SODIUM 100 MG PO CAPS
100.0000 mg | ORAL_CAPSULE | Freq: Two times a day (BID) | ORAL | Status: DC
  Administered 2024-01-20 – 2024-01-24 (×7): 100 mg via ORAL
  Filled 2024-01-20 (×8): qty 1

## 2024-01-20 MED ORDER — ACETAMINOPHEN 10 MG/ML IV SOLN: 1000.0000 mg | Freq: Once | INTRAVENOUS | Status: DC | PRN

## 2024-01-20 MED ORDER — KETOROLAC TROMETHAMINE 30 MG/ML IJ SOLN
INTRAMUSCULAR | Status: AC
  Filled 2024-01-20: qty 1

## 2024-01-20 MED ORDER — BUPIVACAINE-EPINEPHRINE (PF) 0.5% -1:200000 IJ SOLN
INTRAMUSCULAR | Status: AC
  Filled 2024-01-20: qty 30

## 2024-01-20 MED ORDER — MORPHINE SULFATE (PF) 2 MG/ML IV SOLN: 0.5000 mg | INTRAVENOUS | Status: DC | PRN

## 2024-01-20 MED ORDER — ROCURONIUM BROMIDE 10 MG/ML (PF) SYRINGE
PREFILLED_SYRINGE | INTRAVENOUS | Status: DC | PRN
  Administered 2024-01-20: 60 mg via INTRAVENOUS

## 2024-01-20 MED ORDER — ACETAMINOPHEN 325 MG PO TABS: 325.0000 mg | ORAL_TABLET | Freq: Four times a day (QID) | ORAL | Status: DC | PRN

## 2024-01-20 MED ORDER — CHLORHEXIDINE GLUCONATE 0.12 % MT SOLN
OROMUCOSAL | Status: AC
  Filled 2024-01-20: qty 15

## 2024-01-20 MED ORDER — METOCLOPRAMIDE HCL 5 MG/ML IJ SOLN: 5.0000 mg | Freq: Three times a day (TID) | INTRAMUSCULAR | Status: DC | PRN

## 2024-01-20 MED ORDER — METHOCARBAMOL 1000 MG/10ML IJ SOLN: 500.0000 mg | Freq: Four times a day (QID) | INTRAMUSCULAR | Status: DC | PRN

## 2024-01-20 MED ORDER — SODIUM CHLORIDE 0.9 % IR SOLN
Status: DC | PRN
  Administered 2024-01-20: 3000 mL

## 2024-01-20 MED ORDER — FENTANYL CITRATE (PF) 100 MCG/2ML IJ SOLN
INTRAMUSCULAR | Status: AC
  Filled 2024-01-20: qty 2

## 2024-01-20 MED ORDER — METHOCARBAMOL 500 MG PO TABS
500.0000 mg | ORAL_TABLET | Freq: Four times a day (QID) | ORAL | Status: DC | PRN
  Administered 2024-01-23: 500 mg via ORAL
  Filled 2024-01-20 (×2): qty 1

## 2024-01-20 MED ORDER — ONDANSETRON HCL 4 MG PO TABS: 4.0000 mg | ORAL_TABLET | Freq: Four times a day (QID) | ORAL | Status: DC | PRN

## 2024-01-20 MED ORDER — CEFAZOLIN SODIUM-DEXTROSE 2-4 GM/100ML-% IV SOLN
2.0000 g | Freq: Four times a day (QID) | INTRAVENOUS | Status: AC
  Administered 2024-01-21 (×2): 2 g via INTRAVENOUS
  Filled 2024-01-20 (×2): qty 100

## 2024-01-20 MED ORDER — PHENYLEPHRINE 80 MCG/ML (10ML) SYRINGE FOR IV PUSH (FOR BLOOD PRESSURE SUPPORT)
PREFILLED_SYRINGE | INTRAVENOUS | Status: DC | PRN
  Administered 2024-01-20: 160 ug via INTRAVENOUS
  Administered 2024-01-20: 240 ug via INTRAVENOUS
  Administered 2024-01-20: 400 ug via INTRAVENOUS

## 2024-01-20 MED ORDER — HALOPERIDOL LACTATE 5 MG/ML IJ SOLN
1.0000 mg | Freq: Once | INTRAMUSCULAR | Status: AC
  Administered 2024-01-20: 1 mg via INTRAVENOUS
  Filled 2024-01-20: qty 1

## 2024-01-20 MED ORDER — PROPOFOL 10 MG/ML IV BOLUS
INTRAVENOUS | Status: DC | PRN
  Administered 2024-01-20: 100 mg via INTRAVENOUS
  Administered 2024-01-20: 100 ug/kg/min via INTRAVENOUS

## 2024-01-20 MED ORDER — SORBITOL 70 % SOLN
30.0000 mL | Freq: Every day | Status: DC | PRN
  Filled 2024-01-20: qty 30

## 2024-01-20 MED ORDER — LACTATED RINGERS IV SOLN: INTRAVENOUS | Status: DC

## 2024-01-20 MED ORDER — BUPIVACAINE-EPINEPHRINE (PF) 0.5% -1:200000 IJ SOLN
INTRAMUSCULAR | Status: DC | PRN
  Administered 2024-01-20: 30 mL

## 2024-01-20 MED ORDER — FLEET ENEMA RE ENEM
1.0000 | ENEMA | Freq: Once | RECTAL | Status: DC | PRN
  Filled 2024-01-20: qty 1

## 2024-01-20 SURGICAL SUPPLY — 46 items
ALCOHOL 70% 16 OZ (MISCELLANEOUS) ×2 IMPLANT
BAG COUNTER SPONGE SURGICOUNT (BAG) ×2 IMPLANT
BLADE CLIPPER SURG (BLADE) IMPLANT
CABLE CERLAGE W/CRIMP 1.8 (Cable) IMPLANT
CHLORAPREP W/TINT 26 (MISCELLANEOUS) ×2 IMPLANT
COVER SURGICAL LIGHT HANDLE (MISCELLANEOUS) ×2 IMPLANT
CUP RINGBLOC BIPOLAR 28X54 (Orthopedic Implant) IMPLANT
DERMABOND ADVANCED .7 DNX12 (GAUZE/BANDAGES/DRESSINGS) ×4 IMPLANT
DRAPE 3/4 80X56 (DRAPES) ×6 IMPLANT
DRAPE C-ARM 42X72 X-RAY (DRAPES) ×2 IMPLANT
DRAPE STERI IOBAN 125X83 (DRAPES) ×2 IMPLANT
DRAPE U-SHAPE 47X51 STRL (DRAPES) ×6 IMPLANT
DRSG AQUACEL AG ADV 3.5X10 (GAUZE/BANDAGES/DRESSINGS) ×2 IMPLANT
ELECT PENCIL ROCKER SW 15FT (MISCELLANEOUS) ×2 IMPLANT
ELECTRODE BLDE 4.0 EZ CLN MEGD (MISCELLANEOUS) ×2 IMPLANT
ELECTRODE REM PT RTRN 9FT ADLT (ELECTROSURGICAL) ×2 IMPLANT
EVACUATOR 1/8 PVC DRAIN (DRAIN) IMPLANT
GLOVE BIO SURGEON STRL SZ8.5 (GLOVE) ×4 IMPLANT
GLOVE BIOGEL PI IND STRL 8.5 (GLOVE) ×2 IMPLANT
GOWN STRL REUS W/ TWL XL LVL3 (GOWN DISPOSABLE) ×2 IMPLANT
GOWN STRL REUS W/TWL 2XL LVL3 (GOWN DISPOSABLE) ×2 IMPLANT
HEAD MOD COCR 28MM HD -3MM NK (Orthopedic Implant) IMPLANT
HOOD PEEL AWAY T7 (MISCELLANEOUS) ×4 IMPLANT
KIT BASIN OR (CUSTOM PROCEDURE TRAY) ×2 IMPLANT
KIT TURNOVER KIT B (KITS) ×2 IMPLANT
MANIFOLD NEPTUNE II (INSTRUMENTS) ×2 IMPLANT
MARKER SKIN DUAL TIP RULER LAB (MISCELLANEOUS) ×4 IMPLANT
NDL SPNL 18GX3.5 QUINCKE PK (NEEDLE) ×2 IMPLANT
NEEDLE SPNL 18GX3.5 QUINCKE PK (NEEDLE) ×1 IMPLANT
NS IRRIG 1000ML POUR BTL (IV SOLUTION) ×2 IMPLANT
PACK ANTERIOR HIP CUSTOM (KITS) ×2 IMPLANT
PAD ARMBOARD POSITIONER FOAM (MISCELLANEOUS) ×4 IMPLANT
SAW OSC TIP CART 19.5X105X1.3 (SAW) ×2 IMPLANT
SEALER BIPOLAR AQUA 6.0 (INSTRUMENTS) IMPLANT
SET HNDPC FAN SPRY TIP SCT (DISPOSABLE) ×2 IMPLANT
SOLUTION PRONTOSAN WOUND 350ML (IRRIGATION / IRRIGATOR) ×2 IMPLANT
STAPLER SKIN PROX 35W (STAPLE) IMPLANT
STEM FEM DIST PS 20X160 T1 (Stem) IMPLANT
SUT MON AB 2-0 CT1 36 (SUTURE) ×2 IMPLANT
SUT VIC AB 2-0 CT1 TAPERPNT 27 (SUTURE) ×2 IMPLANT
SUTURE STRATFX 0 PDS 27 VIOLET (SUTURE) ×2 IMPLANT
SYR 50ML LL SCALE MARK (SYRINGE) ×2 IMPLANT
TOWEL GREEN STERILE (TOWEL DISPOSABLE) ×2 IMPLANT
TOWEL GREEN STERILE FF (TOWEL DISPOSABLE) ×2 IMPLANT
TUBE SUCTION HIGH CAP CLEAR NV (SUCTIONS) ×2 IMPLANT
WATER STERILE IRR 1000ML POUR (IV SOLUTION) ×6 IMPLANT

## 2024-01-20 NOTE — Progress Notes (Signed)
 Pt. Keeps pulling at foley cath and removing heart monitor. Hand mitts applied

## 2024-01-20 NOTE — OR Nursing (Signed)
 Small amount of dried blood noted around coude catheter at meatus. Urine tea colored.

## 2024-01-20 NOTE — Plan of Care (Signed)
   Problem: Coping: Goal: Level of anxiety will decrease Outcome: Progressing   Problem: Pain Managment: Goal: General experience of comfort will improve and/or be controlled Outcome: Progressing   Problem: Safety: Goal: Ability to remain free from injury will improve Outcome: Progressing

## 2024-01-20 NOTE — Plan of Care (Signed)

## 2024-01-20 NOTE — Anesthesia Preprocedure Evaluation (Signed)
 Anesthesia Evaluation  Patient identified by MRN, date of birth, ID band Patient confused    Reviewed: Allergy & Precautions, NPO status , Patient's Chart, lab work & pertinent test results, Unable to perform ROS - Chart review only  History of Anesthesia Complications Negative for: history of anesthetic complications  Airway Mallampati: II  TM Distance: >3 FB Neck ROM: Full    Dental  (+) Chipped, Dental Advisory Given   Pulmonary former smoker   breath sounds clear to auscultation       Cardiovascular hypertension, Pt. on medications (-) angina + CAD and + Cardiac Stents  + dysrhythmias Atrial Fibrillation  Rhythm:Irregular Rate:Normal  '18 cath:  Small, non-dominant RCA: Prox RCA lesion, 70 %stenosed. Mid RCA lesion, 70 %stenosed. Prox LAD to Mid LAD lesion, 50 %stenosed - calicified/irregular. Mid LAD lesion, 40 %stenosed. Prox Cx to Mid Cx overlapping Cypeher DES (from 11/2007), 0 %stenosed. The left ventricular systolic function is normal. The left ventricular ejection fraction is 55-65% by visual estimate.   False Positive Nuclear Stress Test  '15 ECHO: - Left ventricle: The cavity size was normal. Systolic    function was normal. The estimated ejection fraction was    in the range of 55% to 60%. Wall motion was normal; there    were no regional wall motion abnormalities.  - Mitral valve: Mild regurgitation.  - Left atrium: The atrium was mildly dilated.  - Atrial septum: No defect or patent foramen ovale was    identified.      Neuro/Psych  Headaches    GI/Hepatic Neg liver ROS, PUD,GERD  Controlled,,  Endo/Other  diabetes    Renal/GU stones     Musculoskeletal   Abdominal   Peds  Hematology Xarelto     Anesthesia Other Findings   Reproductive/Obstetrics                              Anesthesia Physical Anesthesia Plan  ASA: 3  Anesthesia Plan: General   Post-op  Pain Management:    Induction: Intravenous  PONV Risk Score and Plan: 2 and Ondansetron , Dexamethasone  and Treatment may vary due to age or medical condition  Airway Management Planned: Oral ETT  Additional Equipment:   Intra-op Plan:   Post-operative Plan:   Informed Consent: I have reviewed the patients History and Physical, chart, labs and discussed the procedure including the risks, benefits and alternatives for the proposed anesthesia with the patient or authorized representative who has indicated his/her understanding and acceptance.     Consent reviewed with POA  Plan Discussed with: CRNA  Anesthesia Plan Comments:          Anesthesia Quick Evaluation

## 2024-01-20 NOTE — Progress Notes (Addendum)
 Pt. Pulled out IV. New 20g inserted right forearm

## 2024-01-20 NOTE — Transfer of Care (Signed)
 Immediate Anesthesia Transfer of Care Note  Patient: Sean Lane  Procedure(s) Performed: HEMIARTHROPLASTY, HIP, DIRECT ANTERIOR APPROACH, FOR FRACTURE (Right)  Patient Location: PACU  Anesthesia Type:General  Level of Consciousness: sedated  Airway & Oxygen Therapy: Patient connected to face mask oxygen  Post-op Assessment: Report given to RN and Post -op Vital signs reviewed and stable  Post vital signs: Reviewed and stable  Last Vitals:  Vitals Value Taken Time  BP 123/62 01/20/24 1932  Temp    Pulse 77 01/20/24 1935  Resp 22 01/20/24 1935  SpO2 99 % 01/20/24 1935  Vitals shown include unfiled device data.  Last Pain:  Vitals:   01/20/24 1507  TempSrc: Oral  PainSc: Asleep      Patients Stated Pain Goal: 0 (01/19/24 2135)  Complications: No notable events documented.

## 2024-01-20 NOTE — Progress Notes (Signed)
 Pt. Wife removed hand mitts, wife educated on the importance of mit use for safety. Mitts reapplied.

## 2024-01-20 NOTE — Interval H&P Note (Signed)
 History and Physical Interval Note:  01/20/2024 5:01 PM  Sean Lane  has presented today for surgery, with the diagnosis of RIGHT FEMORAL NECK FRACTURE.  The various methods of treatment have been discussed with the patient and family. After consideration of risks, benefits and other options for treatment, the patient has consented to  Procedure(s): HEMIARTHROPLASTY, HIP, DIRECT ANTERIOR APPROACH, FOR FRACTURE (Right) as a surgical intervention.  The patient's history has been reviewed, patient examined, no change in status, stable for surgery.  I have reviewed the patient's chart and labs.  Questions were answered to the patient's satisfaction.    The risks, benefits, and alternatives were discussed with the patient. There are risks associated with the surgery including, but not limited to, problems with anesthesia (death), infection, instability (giving out of the joint), dislocation, differences in leg length/angulation/rotation, fracture of bones, loosening or failure of implants, hematoma (blood accumulation) which may require surgical drainage, blood clots, pulmonary embolism, nerve injury (foot drop and lateral thigh numbness), and blood vessel injury. The patient understands these risks and elects to proceed.    Margart Shears Oscar Hank

## 2024-01-20 NOTE — Progress Notes (Signed)
 [facility] ROUNDING NOTE JADIAN DROZ GNF:[id number]  DOB: 05-Jan-1928  DOA: 01/19/2024  PCP: Almira Jaeger, MD  01/20/2024,9:35 AM  LOS: 1 day    Code Status: Full code   from: Home current Dispo: Likely skilled   89 year old white male Known prior right knee arthritis status post procedure 2018 Sensorineural hearing loss CAD with stent 2009 Sick sinus syndrome on Xarelto  BPH with prior urinary retention self cathing previously  5/10 patient had a fall, landed on his right hip did not lose consciousness saw PCP pelvic x-rays lumbar x-rays significant for Nplate compression deformity no pelvic fracture Brought to ED and imaging showed right femoral neck fracture L4 endplate fracture from fall last week Orthopedic surgery consulted planning on surgery--- nerve block performed on 5/10  Plan  Right femoral neck fracture Going for definitive management and surgery 5/11.  Pain control anticoagulation weightbearing status etc. as per orthopedics-currently on IV Tylenol  1000 fentanyl  oxycodone   Delirium Patient confused pulling at lines and Foley catheter gentle redirection needed-avoid anxiolytics or pharmacological management given risk of compounding anesthesia from surgery Discussed with wife keep mittens in place monitor trends do not give Haldol or Ativan  unless absolutely necessary  Sick sinus syndrome A-fib CHADVASC >4 previously on Xarelto  10 supper Resume Xarelto  when cleared by orthopedics does not seem to be on rate control  CAD with stent in 2009 HTN Continues lisinopril  40  amlodipine  2.5  BPH with LUTS Foley catheter placed with some mild trauma no hematuria Observe and see if we can discontinue catheter in a.m.  DVT prophylaxis: SCD at this time  Status is: Inpatient Remains inpatient appropriate because: Needs surgery      Subjective: Slight confusion seems better than it was this morning Can tell me the year thinks the month is April and knows the  president Usually he is a lot more oriented I discussed with his wife at bedside risks of delirium etc. He is about to go to surgery  Objective + exam Vitals:   01/19/24 2200 01/19/24 2217 01/20/24 0500 01/20/24 0931  BP: (!) 146/56 131/63 123/71 (!) 142/77  Pulse: 99 96 94 95  Resp: 18 18 17 18   Temp:  99.4 F (37.4 C) 98 F (36.7 C) 98.7 F (37.1 C)  TempSrc:  Oral Oral Oral  SpO2: 96% 96% 97% 98%  Weight:      Height:       Filed Weights   01/19/24 0809  Weight: 75.3 kg    Examination: EOMI NCAT no focal deficit no icterus no pallor no wheeze no rales no rhonchi S1-S2 seems to be in sinus Abdominal soft no rebound ROM intact Foley catheter in place with mild blood staining of urinary meatus No lower extremity edema Moderate dentition  Data Reviewed: reviewed   CBC    Component Value Date/Time   WBC 7.2 01/20/2024 0627   RBC 4.51 01/20/2024 0627   HGB 14.0 01/20/2024 0627   HGB 15.5 10/24/2016 1344   HCT 41.9 01/20/2024 0627   HCT 45.3 10/24/2016 1344   PLT 186 01/20/2024 0627   PLT 164 10/24/2016 1344   MCV 92.9 01/20/2024 0627   MCV 94 10/24/2016 1344   MCH 31.0 01/20/2024 0627   MCHC 33.4 01/20/2024 0627   RDW 14.6 01/20/2024 0627   RDW 13.5 10/24/2016 1344   LYMPHSABS 1.0 11/27/2023 1541   MONOABS 0.3 11/27/2023 1541   EOSABS 0.2 11/27/2023 1541   BASOSABS 0.0 11/27/2023 1541      Latest Ref  Rng & Units 01/20/2024    6:27 AM 01/19/2024    8:57 AM 11/27/2023    3:41 PM  CMP  Glucose 70 - 99 mg/dL 782  956  213   BUN 8 - 23 mg/dL 16  18  21    Creatinine 0.61 - 1.24 mg/dL 0.86  5.78  4.69   Sodium 135 - 145 mmol/L 138  139  139   Potassium 3.5 - 5.1 mmol/L 3.6  3.3  4.2   Chloride 98 - 111 mmol/L 105  104  104   CO2 22 - 32 mmol/L 21  22  30    Calcium  8.9 - 10.3 mg/dL 9.7  9.7  62.9   Total Protein 6.0 - 8.3 g/dL   7.0   Total Bilirubin 0.2 - 1.2 mg/dL   0.4   Alkaline Phos 39 - 117 U/L   95   AST 0 - 37 U/L   12   ALT 0 - 53 U/L   9      Scheduled Meds:  amLODipine   2.5 mg Oral Daily   DULoxetine   20 mg Oral Daily   fentaNYL  (SUBLIMAZE ) injection  50 mcg Intravenous Once   lisinopril   40 mg Oral Daily   rosuvastatin   10 mg Oral Q supper   Continuous Infusions:  Time  44  Jai-Gurmukh Emillia Weatherly, MD  [facility]

## 2024-01-20 NOTE — Anesthesia Procedure Notes (Signed)
 Procedure Name: Intubation Date/Time: 01/20/2024 3:27 PM  Performed by: Marcelle Sergeant, CRNAPre-anesthesia Checklist: Patient identified, Emergency Drugs available, Suction available and Patient being monitored Patient Re-evaluated:Patient Re-evaluated prior to induction Oxygen Delivery Method: Circle system utilized Preoxygenation: Pre-oxygenation with 100% oxygen Induction Type: IV induction Ventilation: Mask ventilation without difficulty Laryngoscope Size: Mac and 4 Grade View: Grade II Tube type: Oral Tube size: 7.5 mm Number of attempts: 1 Airway Equipment and Method: Stylet and Oral airway Placement Confirmation: ETT inserted through vocal cords under direct vision, positive ETCO2 and breath sounds checked- equal and bilateral Tube secured with: Tape Dental Injury: Teeth and Oropharynx as per pre-operative assessment  Comments: Anterior; obstructive long epiglottis

## 2024-01-20 NOTE — Progress Notes (Signed)
 Pt. Wife refused soft hand mitts,Pt. Is restless and confused pulling at foley. Wife educated the risk of bleed out due to history of blood thinner medication.

## 2024-01-20 NOTE — Progress Notes (Signed)
 Cardiac monitoring on stand by, due to pt. will not leave on. MD notified. Will continue to assess compliance

## 2024-01-20 NOTE — Op Note (Signed)
 OPERATIVE REPORT  SURGEON: Adonica Hoose, MD   ASSISTANT: Staff.  PREOPERATIVE DIAGNOSIS: Displaced (retroverted), comminuted Right femoral neck fracture.   POSTOPERATIVE DIAGNOSIS: Displaced (retroverted), comminuted Right femoral neck fracture.   PROCEDURE: Right hip hemiarthroplasty, anterior approach.   IMPLANTS: Biomet Taperloc Complete Reduced Distal stem, size 20 x 160 mm, high offset, with a 28 - 3 mm metal head ball and a 54 mm bipolar head ball. 1.8 mm adult reconstruction cable.   ANESTHESIA:  General  ANTIBIOTICS: 2g ancef .  ESTIMATED BLOOD LOSS: 250 mL.    DRAINS: None.  COMPLICATIONS: None   CONDITION: PACU - hemodynamically stable.   BRIEF CLINICAL NOTE: Sean Lane is a 89 y.o. male with a displaced Right femoral neck fracture. The patient was admitted to the hospitalist service and underwent perioperative risk stratification and medical optimization. The risks, benefits, and alternatives to hemiarthroplasty were explained, and the patient elected to proceed.  PROCEDURE IN DETAIL: The patient was taken to the operating room and general anesthesia was induced on the hospital bed.  The patient was then positioned on the Hana table.  All bony prominences were well padded.  The hip was prepped and draped in the normal sterile surgical fashion.  A time-out was called verifying side and site of surgery. Antibiotics were given within 60 minutes of beginning the procedure.   Bikini incision was made, and the direct anterior approach to the hip was performed through the Hueter interval.  Superficial dissection was carried out lateral to the ASIS. Lateral femoral circumflex vessels were treated with the Auqumantys. The anterior capsule was exposed and an inverted T capsulotomy was made. Fracture hematoma was encountered and evacuated. The patient was found to have a comminuted Right subcapital femoral neck fracture.  Inferior pubofemoral ligament was released  subperiosteally to the lesser trochanter. I freshened the femoral neck cut with a saw.  I removed the femoral neck fragment.  A corkscrew was placed into the head and the head was removed.  This was passed to the back table and was measured.   Acetabular exposure was achieved.  I examined the articular cartilage which was intact.  The labrum was intact. A 54 mm trial head was placed and found to have excellent fit.   I then gained femoral exposure taking care to protect the abductors and greater trochanter.  The superior capsule was incised longitudinally, staying lateral to the posterior border of the femoral neck. External rotation, extension, and adduction were applied.  The previously identified (CT scan) vertical calcar fracture was visualized.  A cookie cutter was used to enter the femoral canal, and then the femoral canal finder was used to confirm location.  I then sequentially broached up to a size 20. There was no propagation or widening of the vertical defect.  I placed a single subperiosteal prophylactic adult reconstruction cable just distal to the lesser trochanter.  This was done by internally rotating the femur and using the posterior intermuscular septum.  I placed a high offset neck and a trial bipolar construct. The hip was reduced.  Leg lengths were checked fluoroscopically.  The hip was dislocated and trial components were removed.  I placed the real stem followed by the real bipolar construct.  A single reduction maneuver was performed and the hip was reduced.  Fluoroscopy was used to confirm component position and leg lengths.  At 90 degrees of external rotation and extension, the hip was stable to an anterior directed force.   The wound was copiously  irrigated with Prontosan solution and normal saline using pulse lavage.  Marcaine  solution was injected into the periarticular soft tissue.  The wound was closed in layers using #1 Stratafix for the fascia, 2-0 Vicryl for the subcutaneous  fat, 2-0 Monocryl for the deep dermal layer, and staples + Dermabond for the skin.  Once the glue was fully dried, an Aquacell Ag dressing was applied.  The patient was then awakened from anesthesia and transported to the recovery room in stable condition.  Sponge, needle, and instrument counts were correct at the end of the case x2.  The patient tolerated the procedure well and there were no known complications.  Please note that a surgical assistant was a medical necessity for this procedure to perform it in a safe and expeditious manner. Assistant was necessary to provide appropriate retraction of vital neurovascular structures, to prevent femoral fracture, and to allow for anatomic placement of the prosthesis.

## 2024-01-21 ENCOUNTER — Encounter (HOSPITAL_COMMUNITY): Payer: Self-pay | Admitting: Orthopedic Surgery

## 2024-01-21 ENCOUNTER — Ambulatory Visit: Admitting: Podiatry

## 2024-01-21 DIAGNOSIS — S72464D Nondisplaced supracondylar fracture with intracondylar extension of lower end of right femur, subsequent encounter for closed fracture with routine healing: Secondary | ICD-10-CM | POA: Diagnosis not present

## 2024-01-21 LAB — CBC WITH DIFFERENTIAL/PLATELET
Abs Immature Granulocytes: 0.04 10*3/uL (ref 0.00–0.07)
Basophils Absolute: 0 10*3/uL (ref 0.0–0.1)
Basophils Relative: 0 %
Eosinophils Absolute: 0 10*3/uL (ref 0.0–0.5)
Eosinophils Relative: 0 %
HCT: 36 % — ABNORMAL LOW (ref 39.0–52.0)
Hemoglobin: 11.8 g/dL — ABNORMAL LOW (ref 13.0–17.0)
Immature Granulocytes: 1 %
Lymphocytes Relative: 5 %
Lymphs Abs: 0.5 10*3/uL — ABNORMAL LOW (ref 0.7–4.0)
MCH: 31.5 pg (ref 26.0–34.0)
MCHC: 32.8 g/dL (ref 30.0–36.0)
MCV: 96 fL (ref 80.0–100.0)
Monocytes Absolute: 0.6 10*3/uL (ref 0.1–1.0)
Monocytes Relative: 6 %
Neutro Abs: 7.6 10*3/uL (ref 1.7–7.7)
Neutrophils Relative %: 88 %
Platelets: 169 10*3/uL (ref 150–400)
RBC: 3.75 MIL/uL — ABNORMAL LOW (ref 4.22–5.81)
RDW: 14.9 % (ref 11.5–15.5)
WBC: 8.7 10*3/uL (ref 4.0–10.5)
nRBC: 0 % (ref 0.0–0.2)

## 2024-01-21 LAB — BASIC METABOLIC PANEL WITH GFR
Anion gap: 12 (ref 5–15)
BUN: 26 mg/dL — ABNORMAL HIGH (ref 8–23)
CO2: 21 mmol/L — ABNORMAL LOW (ref 22–32)
Calcium: 9.4 mg/dL (ref 8.9–10.3)
Chloride: 104 mmol/L (ref 98–111)
Creatinine, Ser: 0.91 mg/dL (ref 0.61–1.24)
GFR, Estimated: >60 mL/min (ref >60)
Glucose, Bld: 196 mg/dL — ABNORMAL HIGH (ref 70–99)
Potassium: 4.1 mmol/L (ref 3.5–5.1)
Sodium: 137 mmol/L (ref 135–145)

## 2024-01-21 MED ORDER — LISINOPRIL 10 MG PO TABS
10.0000 mg | ORAL_TABLET | Freq: Every day | ORAL | Status: DC
  Administered 2024-01-21 – 2024-01-24 (×4): 10 mg via ORAL
  Filled 2024-01-21 (×4): qty 1

## 2024-01-21 MED ORDER — RIVAROXABAN 10 MG PO TABS
10.0000 mg | ORAL_TABLET | Freq: Every day | ORAL | Status: DC
  Administered 2024-01-21 – 2024-01-24 (×4): 10 mg via ORAL
  Filled 2024-01-21 (×4): qty 1

## 2024-01-21 MED ORDER — ACETAMINOPHEN 500 MG PO TABS
500.0000 mg | ORAL_TABLET | Freq: Four times a day (QID) | ORAL | Status: DC
  Administered 2024-01-21 – 2024-01-24 (×7): 500 mg via ORAL
  Filled 2024-01-21 (×9): qty 1

## 2024-01-21 NOTE — Progress Notes (Addendum)
 Transition of Care [facility]) - Inpatient Brief Assessment   Patient Details  Name: Sean Lane MRN: [id number] Date of Birth: 1928/02/21  Transition of Care [facility]) CM/SW Contact:    Jannine Meo, RN Phone Number: 01/21/2024, 2:38 PM   Clinical Narrative:  Patient from home. PT/OT recommendations are for inpatient rehab. Patient screened by CIR and appears to be a candidate. Full evaluation by CIR to come.  Transition of Care Asessment: Insurance and Status: (P) Insurance coverage has been reviewed Patient has primary care physician: (P) Yes Home environment has been reviewed: (P) Home Prior level of function:: (P) Independent Prior/Current Home Services: (P) No current home services (No history listed in [employer]) Social Drivers of Health Review: (P) SDOH reviewed no interventions necessary Readmission risk has been reviewed: (P) Yes Transition of care needs: (P) no transition of care needs at this time

## 2024-01-21 NOTE — Evaluation (Signed)
 Occupational Therapy Evaluation Patient Details Name: Sean Lane MRN: [id number] DOB: 09/15/1927 Today's Date: 01/21/2024   History of Present Illness   89 yo male presents to ED 5/10 via EMS for fall. Pt sustained R hip fx, s/p R hip hemiarthroplasty anterior approach on 5/11. Postop course complicated by delirium, AKI. Pt with fall last week sustaining L4 endplate fx. PMH includes aflutter, DMII, diverticulosis, gout, HTN, HLD, OA, R UKR 2018, L TKA.     Clinical Impressions PTA patient reports independent with most ADLs, spouse assisting with socks and drying after showering; using cane for mobility (more so since recent fall).  Admitted for above and presents with problem list below.  Pt requires mod assist +2 for bed mobility, min assist + 2 safety for transfers and setup to max assist for ADLs.  Pt limited by impaired balance, decreased activity tolerance and generalized weakness.  Based on performance today, believe pt will best benefit from continued OT services acutely and after dc at an inpatient setting with >3hrs/day to optimize independence, safety and return to PLOF with ADLs and mobility.      If plan is discharge home, recommend the following:   A little help with walking and/or transfers;A lot of help with bathing/dressing/bathroom;Assistance with cooking/housework;Help with stairs or ramp for entrance;Assist for transportation     Functional Status Assessment   Patient has had a recent decline in their functional status and demonstrates the ability to make significant improvements in function in a reasonable and predictable amount of time.     Equipment Recommendations   Other (comment) (defer)     Recommendations for Other Services   Rehab consult     Precautions/Restrictions   Precautions Precautions: Fall Restrictions Weight Bearing Restrictions Per Provider Order: Yes RLE Weight Bearing Per Provider Order: Weight bearing as tolerated      Mobility Bed Mobility Overal bed mobility: Needs Assistance Bed Mobility: Supine to Sit     Supine to sit: Mod assist, +2 for physical assistance     General bed mobility comments: assist for trunk and LE management, scooting to EOB with assist of bed pad.    Transfers Overall transfer level: Needs assistance Equipment used: Rolling walker (2 wheels) Transfers: Sit to/from Stand Sit to Stand: Min assist, From elevated surface, +2 safety/equipment           General transfer comment: assist for rise and steady, cues for correct hand placement when rising and sitting      Balance Overall balance assessment: Needs assistance, History of Falls Sitting-balance support: No upper extremity supported, Feet supported Sitting balance-Leahy Scale: Fair     Standing balance support: Bilateral upper extremity supported, During functional activity, Reliant on assistive device for balance Standing balance-Leahy Scale: Poor                             ADL either performed or assessed with clinical judgement   ADL Overall ADL's : Needs assistance/impaired     Grooming: Set up;Sitting           Upper Body Dressing : Set up;Sitting   Lower Body Dressing: Maximal assistance;Sit to/from stand Lower Body Dressing Details (indicate cue type and reason): needs assist with socks, min assist to stand but relies on UE support Toilet Transfer: Minimal assistance;+2 for safety/equipment;Ambulation Toilet Transfer Details (indicate cue type and reason): RW Toileting- Clothing Manipulation and Hygiene: Minimal assistance;Sit to/from stand  Functional mobility during ADLs: Minimal assistance;+2 for safety/equipment;Rolling walker (2 wheels)       Vision         Perception         Praxis         Pertinent Vitals/Pain Pain Assessment Pain Assessment: Faces Faces Pain Scale: Hurts even more Pain Location: R hip, thigh Pain Descriptors / Indicators:  Sore Pain Intervention(s): Limited activity within patient's tolerance, Monitored during session, Repositioned     Extremity/Trunk Assessment Upper Extremity Assessment Upper Extremity Assessment: Generalized weakness   Lower Extremity Assessment Lower Extremity Assessment: Defer to PT evaluation RLE Deficits / Details: post-operative pain and weakness; able to perform ankle pumps, limited ROM heel slide   Cervical / Trunk Assessment Cervical / Trunk Assessment: Normal   Communication Communication Communication: Impaired Factors Affecting Communication: Hearing impaired   Cognition Arousal: Alert Behavior During Therapy: WFL for tasks assessed/performed Cognition: No apparent impairments             OT - Cognition Comments: pt oriented and following simple commands, limited by [facility] at times.  Not formally assessed but appears functional                 Following commands: Intact       Cueing  General Comments   Cueing Techniques: Verbal cues;Gestural cues  HR elevation to 112 bpm post activity, aquacel dressing in place R anterior hip   Exercises     Shoulder Instructions      Home Living Family/patient expects to be discharged to:: Private residence Living Arrangements: Spouse/significant other Available Help at Discharge: Family Type of Home: House Home Access: Stairs to enter [employer] of Steps: 8 front, 3 back Entrance Stairs-Rails: Can reach both (in front) Home Layout: Two level Alternate Level Stairs-Number of Steps: 13   Bathroom Shower/Tub: Tub/shower unit;Walk-in shower   Bathroom Toilet: Handicapped height     Home Equipment: Agricultural consultant (2 wheels);Cane - single point   Additional Comments: throught front door  8 steps but can stay on that floor; 3 steps up back door but the 13 steps to main level      Prior Functioning/Environment Prior Level of Function : Independent/Modified Independent             Mobility  Comments: using cane in home since first fall, cane in commuinty last 6 months ADLs Comments: some assist for ADLs, specifically drying after showering and donning socks    OT Problem List: Decreased strength;Decreased activity tolerance;Impaired balance (sitting and/or standing);Decreased safety awareness;Decreased knowledge of use of DME or AE;Decreased knowledge of precautions;Pain   OT Treatment/Interventions: Self-care/ADL training;Therapeutic exercise;DME and/or AE instruction;Therapeutic activities;Patient/family education;Balance training      OT Goals(Current goals can be found in the care plan section)   Acute Rehab OT Goals Patient Stated Goal: get better OT Goal Formulation: With patient Time For Goal Achievement: 02/04/24 Potential to Achieve Goals: Good   OT Frequency:  Min 2X/week    Co-evaluation PT/OT/SLP Co-Evaluation/Treatment: Yes Reason for Co-Treatment: For patient/therapist safety;To address functional/ADL transfers PT goals addressed during session: Mobility/safety with mobility;Balance OT goals addressed during session: ADL's and self-care      AM-PAC OT "6 Clicks" Daily Activity     Outcome Measure Help from another person eating meals?: None Help from another person taking care of personal grooming?: A Little Help from another person toileting, which includes using toliet, bedpan, or urinal?: A Little Help from another person bathing (including washing, rinsing, drying)?: A  Lot Help from another person to put on and taking off regular upper body clothing?: A Little Help from another person to put on and taking off regular lower body clothing?: A Lot 6 Click Score: 17   End of Session Equipment Utilized During Treatment: Gait belt;Rolling walker (2 wheels) Nurse Communication: Mobility status;Precautions  Activity Tolerance: Patient tolerated treatment well Patient left: in chair;with call bell/phone within reach;with chair alarm set;with  family/visitor present  OT Visit Diagnosis: Other abnormalities of gait and mobility (R26.89);Muscle weakness (generalized) (M62.81);Pain;History of falling (Z91.81) Pain - Right/Left: Right Pain - part of body: Hip                Time: 9147-8295 OT Time Calculation (min): 26 min Charges:  OT General Charges $OT Visit: 1 Visit OT Evaluation $OT Eval Moderate Complexity: 1 Mod  Bary Boss, OT Acute Rehabilitation Services Office [phone] Secure Chat Preferred    Sean Lane 01/21/2024, 12:40 PM

## 2024-01-21 NOTE — Care Management Important Message (Signed)
 Important Message  Patient Details  Name: Sean Lane MRN: [id number] Date of Birth: [birth year]   Important Message Given:  Yes - Medicare IM     Felix Host 01/21/2024, 2:17 PM

## 2024-01-21 NOTE — Discharge Instructions (Signed)

## 2024-01-21 NOTE — Progress Notes (Signed)

## 2024-01-21 NOTE — Anesthesia Postprocedure Evaluation (Signed)
 Anesthesia Post Note  Patient: Sean Lane  Procedure(s) Performed: HEMIARTHROPLASTY, HIP, DIRECT ANTERIOR APPROACH, FOR FRACTURE (Right)     Patient location during evaluation: PACU Anesthesia Type: General Level of consciousness: awake and alert Pain management: pain level controlled Vital Signs Assessment: post-procedure vital signs reviewed and stable Respiratory status: spontaneous breathing, nonlabored ventilation, respiratory function stable and patient connected to nasal cannula oxygen Cardiovascular status: blood pressure returned to baseline and stable Postop Assessment: no apparent nausea or vomiting Anesthetic complications: no   No notable events documented.  Last Vitals:  Vitals:   01/20/24 2050 01/21/24 0545  BP: 131/65 (!) 129/58  Pulse: 89 89  Resp: 16 16  Temp: 36.6 C 36.7 C  SpO2: 97% 95%    Last Pain:  Vitals:   01/21/24 0619  TempSrc:   PainSc: 3                  Lethaniel Rave

## 2024-01-21 NOTE — Progress Notes (Signed)
 [facility] ROUNDING NOTE MALLIK RIZK ZOX:[id number]  DOB: September 30, 1927  DOA: 01/19/2024  PCP: Almira Jaeger, MD  01/21/2024,9:32 AM  LOS: 2 days    Code Status: Full code   from: Home current Dispo: Likely skilled   89 year old white male Known prior right knee arthritis status post procedure 2018 Sensorineural hearing loss CAD with stent 2009 Sick sinus syndrome on Xarelto  BPH with prior urinary retention self cathing previously  5/10 patient had a fall, landed on his right hip did not lose consciousness saw PCP pelvic x-rays lumbar x-rays significant for Nplate compression deformity no pelvic fracture Brought to ED and imaging showed right femoral neck fracture L4 endplate fracture from fall last week Orthopedic surgery consulted planning on surgery--- nerve block performed on 5/10 5/11Right hemiarthroplasty performed on 5/   Plan  Right femoral neck fracture Definitive hemiarthroplasty 5/11-pain control Tylenol  500 every 6 first choice Can use Norco 1-2 tab 5 mg dose for moderate pain, 7.5 mg dose for severe pain, IV morphine  to be given if severe  AKI superimposed on CKD 3a Increase IV fluid to 100 cc/h--repeat labs in a.m. and force oral fluids-- some component of possible effect from ACE so cut  back as below  Anemia of expected blood loss Drop in hemoglobin expected-monitor trends with labs  Delirium Likely secondary to medications anesthesia pain and lack of sleep Now much improved and not requiring any further interventions-wife is aware to try and stay at the bedside if possible Resumed Cymbalta  20  Sick sinus syndrome A-fib CHADVASC >4 previously on Xarelto  10 supper  Xarelto  10 daily resumed Not rate controlled on any agent  CAD with stent in 2009 HTN Continues lisinopril  40---10 now given azotemia amlodipine  2.5  BPH with LUTS DC catheter today and see if he voids out of bed with therapy   Updated wife at the bedside-probably require skilled  DVT  prophylaxis: SCD at this time  Status is: Inpatient Remains inpatient appropriate because:    Likely will need skilled placement  Subjective:  Looks well-awake oriented close to baseline mental state Pain is 2-3/10 otherwise no issues  Objective + exam Vitals:   01/20/24 2015 01/20/24 2050 01/21/24 0545 01/21/24 0857  BP: (!) 114/58 131/65 (!) 129/58 (!) 126/108  Pulse: 84 89 89 86  Resp: 17 16 16 17   Temp: 98 F (36.7 C) 97.9 F (36.6 C) 98.1 F (36.7 C) 98.4 F (36.9 C)  TempSrc:  Oral Oral Oral  SpO2: 93% 97% 95% 98%  Weight:      Height:       Filed Weights   01/19/24 0809  Weight: 75.3 kg    Examination:  No icterus no pallor no wheeze no rales no rhonchi CTAB no added sound ROM intact S1-S2 no murmur no rub no gallop Power is 5/5 in upper extremities slightly limited on the right side with straight leg raise Large reinforce bandage in place No lower extremity edema  Data Reviewed: reviewed   CBC    Component Value Date/Time   WBC 8.7 01/21/2024 0603   RBC 3.75 (L) 01/21/2024 0603   HGB 11.8 (L) 01/21/2024 0603   HGB 15.5 10/24/2016 1344   HCT 36.0 (L) 01/21/2024 0603   HCT 45.3 10/24/2016 1344   PLT 169 01/21/2024 0603   PLT 164 10/24/2016 1344   MCV 96.0 01/21/2024 0603   MCV 94 10/24/2016 1344   MCH 31.5 01/21/2024 0603   MCHC 32.8 01/21/2024 0603   RDW 14.9 01/21/2024  0603   RDW 13.5 10/24/2016 1344   LYMPHSABS 0.5 (L) 01/21/2024 0603   MONOABS 0.6 01/21/2024 0603   EOSABS 0.0 01/21/2024 0603   BASOSABS 0.0 01/21/2024 0603      Latest Ref Rng & Units 01/21/2024    6:03 AM 01/20/2024    6:27 AM 01/19/2024    8:57 AM  CMP  Glucose 70 - 99 mg/dL 409  811  914   BUN 8 - 23 mg/dL 26  16  18    Creatinine 0.61 - 1.24 mg/dL 7.82  9.56  2.13   Sodium 135 - 145 mmol/L 137  138  139   Potassium 3.5 - 5.1 mmol/L 4.1  3.6  3.3   Chloride 98 - 111 mmol/L 104  105  104   CO2 22 - 32 mmol/L 21  21  22    Calcium  8.9 - 10.3 mg/dL 9.4  9.7  9.7      Scheduled Meds:  acetaminophen   500 mg Oral Q6H   amLODipine   2.5 mg Oral Daily   docusate sodium   100 mg Oral BID   DULoxetine   20 mg Oral Daily   lisinopril   10 mg Oral Daily   rivaroxaban   10 mg Oral Q supper   rosuvastatin   10 mg Oral Q supper   senna  1 tablet Oral BID   Continuous Infusions:  sodium chloride  40 mL/hr at 01/20/24 2206    Time  24  Verlie Glisson, MD  [facility]

## 2024-01-21 NOTE — Progress Notes (Signed)
 Patient unable to void post catheter removal. Bladder scan revealed . Md notified and stated to continue to monitor and recheck a bladder scan.

## 2024-01-21 NOTE — Progress Notes (Signed)
    Subjective:  Patient reports pain as mild to moderate.  Denies N/V/CP/SOB/Abd pain. He reports some thigh pain. Denies tingling or numbness in LE bilaterally.  Wife at bedside.  Had episode of delirium. Try and limit narcotics if possible.   Objective:   VITALS:   Vitals:   01/20/24 2000 01/20/24 2015 01/20/24 2050 01/21/24 0545  BP: 126/65 (!) 114/58 131/65 (!) 129/58  Pulse: 88 84 89 89  Resp: 16 17 16 16   Temp:  98 F (36.7 C) 97.9 F (36.6 C) 98.1 F (36.7 C)  TempSrc:   Oral Oral  SpO2: 98% 93% 97% 95%  Weight:      Height:        NAD Neurologically intact ABD soft Neurovascular intact Sensation intact distally Intact pulses distally Dorsiflexion/Plantar flexion intact Incision: dressing C/D/I No cellulitis present Compartment soft   Lab Results  Component Value Date   WBC 8.7 01/21/2024   HGB 11.8 (L) 01/21/2024   HCT 36.0 (L) 01/21/2024   MCV 96.0 01/21/2024   PLT 169 01/21/2024   BMET    Component Value Date/Time   NA 137 01/21/2024 0603   NA 141 07/06/2022 1155   K 4.1 01/21/2024 0603   CL 104 01/21/2024 0603   CO2 21 (L) 01/21/2024 0603   GLUCOSE 196 (H) 01/21/2024 0603   BUN 26 (H) 01/21/2024 0603   BUN 17 07/06/2022 1155   CREATININE 0.91 01/21/2024 0603   CALCIUM  9.4 01/21/2024 0603   EGFR 80 07/06/2022 1155   GFRNONAA >60 01/21/2024 0603     Assessment/Plan: 1 Day Post-Op   Principal Problem:   Femur fracture (HCC) Active Problems:   Hyperlipidemia   Essential hypertension   SINUS BRADYCARDIA   GERD   BPH associated with nocturia   Atrial flutter (HCC)   Type 2 diabetes mellitus with CAD(HCC)   CAD S/P percutaneous coronary angioplasty   Fracture of femoral neck, right, closed (HCC)   WBAT with walker DVT ppx: Xarelto  to start today, SCDs, TEDS PO pain control PT/OT: To come today.  Dispo:  Patient under care of the medical team, disposition per their recommendation. Patient hopeful for discharge home. Had episode  of delirium, try and hold narcotics if possible, take tylenol .    Harman Lightning 01/21/2024, 8:36 AM   [facility]  [facility] [address], [address], [place], [place] [address] Phone: [phone] [email or web address] Facebook  [employer]

## 2024-01-21 NOTE — Evaluation (Signed)
 Physical Therapy Evaluation Patient Details Name: Sean Lane MRN: [id number] DOB: Dec 02, 1927 Today's Date: 01/21/2024  History of Present Illness  89 yo male presents to ED 5/10 via EMS for fall. Pt sustained R hip fx, s/p R hip hemiarthroplasty anterior approach on 5/11. Postop course complicated by delirium, AKI. Pt with fall last week sustaining L4 endplate fx. PMH includes aflutter, DMII, diverticulosis, gout, HTN, HLD, OA, R UKR 2018, L TKA.  Clinical Impression   Pt presents with R hip pain, generalized weakness, impaired balance with history of multiple recent falls, antalgic gait, and decreased activity tolerance. Pt to benefit from acute PT to address deficits. Pt ambulated short distance with use of RW, overall requiring min-mod +2 safety for mobility at this time. Prior to x2 falls, pt was independent with cane at home. Patient will benefit from intensive inpatient follow-up therapy, >3 hours/day. PT to progress mobility as tolerated, and will continue to follow acutely.          If plan is discharge home, recommend the following: A lot of help with walking and/or transfers;A lot of help with bathing/dressing/bathroom   Can travel by private vehicle        Equipment Recommendations None recommended by PT  Recommendations for Other Services       Functional Status Assessment Patient has had a recent decline in their functional status and demonstrates the ability to make significant improvements in function in a reasonable and predictable amount of time.     Precautions / Restrictions Precautions Precautions: Fall Restrictions Weight Bearing Restrictions Per Provider Order: No RLE Weight Bearing Per Provider Order: Weight bearing as tolerated      Mobility  Bed Mobility Overal bed mobility: Needs Assistance Bed Mobility: Supine to Sit     Supine to sit: Mod assist, +2 for physical assistance     General bed mobility comments: assist for trunk and LE  management, scooting to EOB with assist of bed pad.    Transfers Overall transfer level: Needs assistance Equipment used: Rolling walker (2 wheels) Transfers: Sit to/from Stand Sit to Stand: Min assist, From elevated surface, +2 safety/equipment           General transfer comment: assist for rise and steady, cues for correct hand placement when rising and sitting    Ambulation/Gait Ambulation/Gait assistance: Min assist, +2 safety/equipment Gait Distance (Feet): 45 Feet Assistive device: Rolling walker (2 wheels) Gait Pattern/deviations: Step-through pattern, Decreased stride length, Antalgic, Decreased weight shift to right Gait velocity: decr     General Gait Details: assist to steady and manage RW/lines and leads, cues for sequencing and placement in RW  Stairs            Wheelchair Mobility     Tilt Bed    Modified Rankin (Stroke Patients Only)       Balance Overall balance assessment: Needs assistance, History of Falls Sitting-balance support: No upper extremity supported, Feet supported Sitting balance-Leahy Scale: Fair     Standing balance support: Bilateral upper extremity supported, During functional activity, Reliant on assistive device for balance Standing balance-Leahy Scale: Poor                               Pertinent Vitals/Pain Pain Assessment Pain Assessment: Faces Faces Pain Scale: Hurts even more Pain Location: R hip, thigh Pain Descriptors / Indicators: Sore Pain Intervention(s): Limited activity within patient's tolerance, Monitored during session, Repositioned    Home  Living Family/patient expects to be discharged to:: Private residence Living Arrangements: Spouse/significant other Available Help at Discharge: Family Type of Home: House Home Access: Stairs to enter Entrance Stairs-Rails: Can reach both (in back) Entrance Stairs-Number of Steps: 8 front, 3 back Alternate Level Stairs-Number of Steps: 13 Home  Layout: Two level Home Equipment: Agricultural consultant (2 wheels);Cane - single point Additional Comments: throught front door  8 steps but can stay on that floor; 3 steps up back door but the 13 steps to main level    Prior Function Prior Level of Function : Independent/Modified Independent             Mobility Comments: using cane in home since first fall, cane in commuinty last 6 months ADLs Comments: some assist for ADLs, specifically drying after showering and donning socks     Extremity/Trunk Assessment   Upper Extremity Assessment Upper Extremity Assessment: Defer to OT evaluation    Lower Extremity Assessment Lower Extremity Assessment: Generalized weakness;RLE deficits/detail RLE Deficits / Details: post-operative pain and weakness; able to perform ankle pumps, limited ROM heel slide    Cervical / Trunk Assessment Cervical / Trunk Assessment: Normal  Communication   Communication Communication: Impaired Factors Affecting Communication: Hearing impaired    Cognition Arousal: Alert Behavior During Therapy: WFL for tasks assessed/performed   PT - Cognitive impairments: Sequencing                       PT - Cognition Comments: A&Ox4 though increased time to recall month and year. Requires step-by-step sequencing cues         Cueing Cueing Techniques: Verbal cues, Gestural cues     General Comments General comments (skin integrity, edema, etc.): HR elevation to 112 bpm post-gait, aquacel dressing in place R anterior hip    Exercises     Assessment/Plan    PT Assessment Patient needs continued PT services  PT Problem List Decreased mobility;Decreased strength;Decreased range of motion;Decreased activity tolerance;Decreased balance;Decreased knowledge of use of DME;Pain;Cardiopulmonary status limiting activity;Decreased cognition;Decreased safety awareness       PT Treatment Interventions Therapeutic activities;DME instruction;Gait training;Therapeutic  exercise;Patient/family education;Balance training;Stair training;Functional mobility training;Neuromuscular re-education    PT Goals (Current goals can be found in the Care Plan section)  Acute Rehab PT Goals PT Goal Formulation: With patient Time For Goal Achievement: 02/04/24 Potential to Achieve Goals: Good    Frequency Min 3X/week     Co-evaluation PT/OT/SLP Co-Evaluation/Treatment: Yes Reason for Co-Treatment: For patient/therapist safety;To address functional/ADL transfers PT goals addressed during session: Mobility/safety with mobility;Balance         AM-PAC PT "6 Clicks" Mobility  Outcome Measure Help needed turning from your back to your side while in a flat bed without using bedrails?: A Little Help needed moving from lying on your back to sitting on the side of a flat bed without using bedrails?: A Lot Help needed moving to and from a bed to a chair (including a wheelchair)?: A Lot Help needed standing up from a chair using your arms (e.g., wheelchair or bedside chair)?: A Little Help needed to walk in hospital room?: A Little Help needed climbing 3-5 steps with a railing? : Total 6 Click Score: 14    End of Session Equipment Utilized During Treatment: Gait belt Activity Tolerance: Patient tolerated treatment well Patient left: in chair;with call bell/phone within reach;with chair alarm set;with family/visitor present Nurse Communication: Mobility status PT Visit Diagnosis: Other abnormalities of gait and mobility (R26.89)  Time: 4098-1191 PT Time Calculation (min) (ACUTE ONLY): 29 min   Charges:   PT Evaluation $PT Eval Low Complexity: 1 Low   PT General Charges $$ ACUTE PT VISIT: 1 Visit         Shirlene Doughty, PT DPT Acute Rehabilitation Services Secure Chat Preferred  Office [phone]   Aisea Bouldin E Burnadette Carrion 01/21/2024, 11:35 AM

## 2024-01-22 DIAGNOSIS — S72464D Nondisplaced supracondylar fracture with intracondylar extension of lower end of right femur, subsequent encounter for closed fracture with routine healing: Secondary | ICD-10-CM | POA: Diagnosis not present

## 2024-01-22 LAB — CBC WITH DIFFERENTIAL/PLATELET
Abs Immature Granulocytes: 0.02 10*3/uL (ref 0.00–0.07)
Basophils Absolute: 0 10*3/uL (ref 0.0–0.1)
Basophils Relative: 0 %
Eosinophils Absolute: 0 10*3/uL (ref 0.0–0.5)
Eosinophils Relative: 0 %
HCT: 28.9 % — ABNORMAL LOW (ref 39.0–52.0)
Hemoglobin: 9.8 g/dL — ABNORMAL LOW (ref 13.0–17.0)
Immature Granulocytes: 0 %
Lymphocytes Relative: 15 %
Lymphs Abs: 1 10*3/uL (ref 0.7–4.0)
MCH: 32 pg (ref 26.0–34.0)
MCHC: 33.9 g/dL (ref 30.0–36.0)
MCV: 94.4 fL (ref 80.0–100.0)
Monocytes Absolute: 0.5 10*3/uL (ref 0.1–1.0)
Monocytes Relative: 8 %
Neutro Abs: 4.9 10*3/uL (ref 1.7–7.7)
Neutrophils Relative %: 77 %
Platelets: 168 10*3/uL (ref 150–400)
RBC: 3.06 MIL/uL — ABNORMAL LOW (ref 4.22–5.81)
RDW: 15 % (ref 11.5–15.5)
WBC: 6.5 10*3/uL (ref 4.0–10.5)
nRBC: 0 % (ref 0.0–0.2)

## 2024-01-22 LAB — BASIC METABOLIC PANEL WITH GFR
Anion gap: 7 (ref 5–15)
BUN: 26 mg/dL — ABNORMAL HIGH (ref 8–23)
CO2: 23 mmol/L (ref 22–32)
Calcium: 9.1 mg/dL (ref 8.9–10.3)
Chloride: 108 mmol/L (ref 98–111)
Creatinine, Ser: 0.75 mg/dL (ref 0.61–1.24)
GFR, Estimated: >60 mL/min (ref >60)
Glucose, Bld: 142 mg/dL — ABNORMAL HIGH (ref 70–99)
Potassium: 3.6 mmol/L (ref 3.5–5.1)
Sodium: 138 mmol/L (ref 135–145)

## 2024-01-22 MED ORDER — SODIUM CHLORIDE 0.9 % IV SOLN: INTRAVENOUS | Status: DC

## 2024-01-22 MED ORDER — HYDROCODONE-ACETAMINOPHEN 5-325 MG PO TABS: 1.0000 | ORAL_TABLET | ORAL | 0 refills | Status: DC | PRN

## 2024-01-22 NOTE — Progress Notes (Signed)
    Subjective:  Patient reports pain as mild to moderate.  Denies N/V/CP/SOB/Abd pain. He denies any tingling or numbness in LE bilaterally. He reports he is doing better today.  Wife at bedside.   Objective:   VITALS:   Vitals:   01/21/24 1348 01/21/24 2023 01/22/24 0540 01/22/24 0717  BP: 129/62 (!) 114/59 (!) 122/59 127/62  Pulse: 93 87 79 77  Resp: 16 16  18   Temp: 98.1 F (36.7 C) 99.1 F (37.3 C) 98 F (36.7 C) 98.6 F (37 C)  TempSrc:  Oral Oral Oral  SpO2: 99% 95% 98% 97%  Weight:      Height:        NAD Neurologically intact ABD soft Neurovascular intact Sensation intact distally Intact pulses distally Dorsiflexion/Plantar flexion intact Incision: dressing C/D/I No cellulitis present Compartment soft   Lab Results  Component Value Date   WBC 6.5 01/22/2024   HGB 9.8 (L) 01/22/2024   HCT 28.9 (L) 01/22/2024   MCV 94.4 01/22/2024   PLT 168 01/22/2024   BMET    Component Value Date/Time   NA 138 01/22/2024 0609   NA 141 07/06/2022 1155   K 3.6 01/22/2024 0609   CL 108 01/22/2024 0609   CO2 23 01/22/2024 0609   GLUCOSE 142 (H) 01/22/2024 0609   BUN 26 (H) 01/22/2024 0609   BUN 17 07/06/2022 1155   CREATININE 0.75 01/22/2024 0609   CALCIUM  9.1 01/22/2024 0609   EGFR 80 07/06/2022 1155   GFRNONAA >60 01/22/2024 9528     Assessment/Plan: 2 Days Post-Op   Principal Problem:   Femur fracture (HCC) Active Problems:   Hyperlipidemia   Essential hypertension   SINUS BRADYCARDIA   GERD   BPH associated with nocturia   Atrial flutter (HCC)   Type 2 diabetes mellitus with CAD(HCC)   CAD S/P percutaneous coronary angioplasty   Fracture of femoral neck, right, closed (HCC)   WBAT with walker DVT ppx: Xarelto , SCDs, TEDS PO pain control PT/OT: Ambulated 45 feet with PT.  Dispo:  - Patient under care of the medical team, disposition per their recommendation. Patient hopeful for discharge home. Discussion about potential CIR. Continue to try  and limit narcotics if possible, take tylenol . Pain medication printed in chart.    Harman Lightning 01/22/2024, 12:05 PM   [facility]  [facility] [place] [address], [address], [place], [place] [address] Phone: [phone] [email or web address] Facebook  [employer]

## 2024-01-22 NOTE — Progress Notes (Signed)
 [facility] ROUNDING NOTE Sean Lane FAO:[id number]  DOB: 1928-08-11  DOA: 01/19/2024  PCP: Almira Jaeger, MD  01/22/2024,9:29 AM  LOS: 3 days    Code Status: Full code   from: Home current Dispo: Likely skilled   89 year old white male Known prior right knee arthritis status post procedure 2018 Sensorineural hearing loss CAD with stent 2009 Sick sinus syndrome on Xarelto  BPH with prior urinary retention self cathing previously  5/10 patient had a fall, landed on his right hip did not lose consciousness saw PCP pelvic x-rays lumbar x-rays significant for Nplate compression deformity no pelvic fracture Brought to ED and imaging showed right femoral neck fracture L4 endplate fracture from fall last week Orthopedic surgery consulted planning on surgery--- nerve block performed on 5/10 5/11Right hemiarthroplasty performed on 5/   Plan  Right femoral neck fracture Definitive hemiarthroplasty 5/11-pain control Tylenol  500 every 6 first choice--Norco 1-2 tab 5 mg dose for moderate pain, 7.5 mg dose for severe pain, IV morphine  to be given if severe Pain seems moderately well-controlled on oral meds  AKI superimposed on CKD 3a Saline 40 cc/H, cut back lisinopril  to 10 recheck labs in a.m.  Anemia of expected blood loss Drop in hemoglobin expected-monitor trends with labs-transfusion threshold below8  Delirium Likely secondary to medications anesthesia pain and lack of sleep Now much improved and not requiring any further interventions- Resumed Cymbalta  20  Sick sinus syndrome A-fib CHADVASC >4 previously on Xarelto  10 supper  Xarelto  10 daily resumed Not rate controlled on any agent  CAD with stent in 2009 HTN Continues lisinopril  10 now given azotemia amlodipine  2.5  BPH with LUTS DC catheter today and see if he voids out of bed with therapy   Updated wife at the bedside-probably require skilled Unclear if would need criterion for CIR CIR MD to see  DVT prophylaxis: SCD  at this time  Status is: Inpatient Remains inpatient appropriate because:    Likely will need skilled placement  Subjective:  Pain moderately controlled no problems overall passing stool no fever no chills   Objective + exam Vitals:   01/21/24 1348 01/21/24 2023 01/22/24 0540 01/22/24 0717  BP: 129/62 (!) 114/59 (!) 122/59 127/62  Pulse: 93 87 79 77  Resp: 16 16  18   Temp: 98.1 F (36.7 C) 99.1 F (37.3 C) 98 F (36.7 C) 98.6 F (37 C)  TempSrc:  Oral Oral Oral  SpO2: 99% 95% 98% 97%  Weight:      Height:       Filed Weights   01/19/24 0809  Weight: 75.3 kg    Examination:  Awake coherent no icterus no pallor no wheeze no rales Chest is clear S1-S2 no murmur ROM is intact reinforce dressing on right side he has no lower extremity swelling  Data Reviewed: reviewed   CBC    Component Value Date/Time   WBC 6.5 01/22/2024 0609   RBC 3.06 (L) 01/22/2024 0609   HGB 9.8 (L) 01/22/2024 0609   HGB 15.5 10/24/2016 1344   HCT 28.9 (L) 01/22/2024 0609   HCT 45.3 10/24/2016 1344   PLT 168 01/22/2024 0609   PLT 164 10/24/2016 1344   MCV 94.4 01/22/2024 0609   MCV 94 10/24/2016 1344   MCH 32.0 01/22/2024 0609   MCHC 33.9 01/22/2024 0609   RDW 15.0 01/22/2024 0609   RDW 13.5 10/24/2016 1344   LYMPHSABS 1.0 01/22/2024 0609   MONOABS 0.5 01/22/2024 0609   EOSABS 0.0 01/22/2024 0609   BASOSABS  0.0 01/22/2024 0609      Latest Ref Rng & Units 01/22/2024    6:09 AM 01/21/2024    6:03 AM 01/20/2024    6:27 AM  CMP  Glucose 70 - 99 mg/dL 161  096  045   BUN 8 - 23 mg/dL 26  26  16    Creatinine 0.61 - 1.24 mg/dL 4.09  8.11  9.14   Sodium 135 - 145 mmol/L 138  137  138   Potassium 3.5 - 5.1 mmol/L 3.6  4.1  3.6   Chloride 98 - 111 mmol/L 108  104  105   CO2 22 - 32 mmol/L 23  21  21    Calcium  8.9 - 10.3 mg/dL 9.1  9.4  9.7     Scheduled Meds:  acetaminophen   500 mg Oral Q6H   amLODipine   2.5 mg Oral Daily   docusate sodium   100 mg Oral BID   DULoxetine   20 mg  Oral Daily   lisinopril   10 mg Oral Daily   rivaroxaban   10 mg Oral Q supper   rosuvastatin   10 mg Oral Q supper   senna  1 tablet Oral BID   Continuous Infusions:  sodium chloride       Time  24  Jai-Gurmukh Rameses Ou, MD  [facility]

## 2024-01-22 NOTE — Progress Notes (Signed)
 Physical Therapy Treatment Patient Details Name: Sean Lane MRN: [id number] DOB: Aug 08, 1928 Today's Date: 01/22/2024   History of Present Illness 89 yo male presents to ED 5/10 via EMS for fall. Pt sustained R hip fx, s/p R hip hemiarthroplasty anterior approach on 5/11. Postop course complicated by delirium, AKI. Pt with fall last week sustaining L4 endplate fx. PMH includes aflutter, DMII, diverticulosis, gout, HTN, HLD, OA, R UKR 2018, L TKA.    PT Comments  Pt received in supine and agreeable to session. Pt reports increased pain today and that he has been in bed all day. Pt and pt's wife educated on importance of OOB activity throughout the day. Pt requires up to min A for all mobility, however is limited by pain and fatigue. Pt able to tolerate short gait distance in the room, however demonstrates increased instability with progressed distance. Pt continues to benefit from PT services to progress toward functional mobility goals.     If plan is discharge home, recommend the following: A lot of help with walking and/or transfers;A lot of help with bathing/dressing/bathroom   Can travel by private vehicle        Equipment Recommendations  None recommended by PT    Recommendations for Other Services       Precautions / Restrictions Precautions Precautions: Fall Restrictions Weight Bearing Restrictions Per Provider Order: Yes RLE Weight Bearing Per Provider Order: Weight bearing as tolerated     Mobility  Bed Mobility Overal bed mobility: Needs Assistance Bed Mobility: Supine to Sit, Sit to Supine     Supine to sit: Min assist Sit to supine: Min assist   General bed mobility comments: Min A for RLE management and trunk elevation    Transfers Overall transfer level: Needs assistance Equipment used: Rolling walker (2 wheels) Transfers: Sit to/from Stand Sit to Stand: Min assist, From elevated surface           General transfer comment: From EOB x2 with cues  for hand placement    Ambulation/Gait Ambulation/Gait assistance: Min assist Gait Distance (Feet): 15 Feet Assistive device: Rolling walker (2 wheels) Gait Pattern/deviations: Step-through pattern, Decreased stride length, Antalgic, Decreased weight shift to right, Trunk flexed Gait velocity: decr   Pre-gait activities: weight shifting and marching at EOB General Gait Details: intermittent min A for balance and cues for sequencing and upright posture   Stairs             Wheelchair Mobility     Tilt Bed    Modified Rankin (Stroke Patients Only)       Balance Overall balance assessment: Needs assistance, History of Falls Sitting-balance support: No upper extremity supported, Feet supported Sitting balance-Leahy Scale: Fair Sitting balance - Comments: sitting EOB   Standing balance support: Bilateral upper extremity supported, During functional activity, Reliant on assistive device for balance Standing balance-Leahy Scale: Poor Standing balance comment: with RW support                            Communication Communication Communication: Impaired Factors Affecting Communication: Hearing impaired  Cognition Arousal: Alert Behavior During Therapy: WFL for tasks assessed/performed   PT - Cognitive impairments: Sequencing                         Following commands: Intact      Cueing Cueing Techniques: Verbal cues, Gestural cues  Exercises      General Comments  Pertinent Vitals/Pain Pain Assessment Pain Assessment: Faces Faces Pain Scale: Hurts whole lot Pain Location: R hip, thigh Pain Descriptors / Indicators: Sore Pain Intervention(s): Limited activity within patient's tolerance, Monitored during session, Repositioned     PT Goals (current goals can now be found in the care plan section) Acute Rehab PT Goals PT Goal Formulation: With patient Time For Goal Achievement: 02/04/24 Progress towards PT goals: Progressing  toward goals    Frequency    Min 3X/week       AM-PAC PT "6 Clicks" Mobility   Outcome Measure  Help needed turning from your back to your side while in a flat bed without using bedrails?: A Little Help needed moving from lying on your back to sitting on the side of a flat bed without using bedrails?: A Little Help needed moving to and from a bed to a chair (including a wheelchair)?: A Little Help needed standing up from a chair using your arms (e.g., wheelchair or bedside chair)?: A Little Help needed to walk in hospital room?: A Little Help needed climbing 3-5 steps with a railing? : Total 6 Click Score: 16    End of Session Equipment Utilized During Treatment: Gait belt Activity Tolerance: Patient tolerated treatment well;Patient limited by pain Patient left: in bed;with family/visitor present;with call bell/phone within reach Nurse Communication: Mobility status PT Visit Diagnosis: Other abnormalities of gait and mobility (R26.89)     Time: 1610-9604 PT Time Calculation (min) (ACUTE ONLY): 37 min  Charges:    $Gait Training: 8-22 mins $Therapeutic Activity: 8-22 mins PT General Charges $$ ACUTE PT VISIT: 1 Visit                     Michaelle Adolphus, PTA Acute Rehabilitation Services Secure Chat Preferred  [phone]    Michaelle Adolphus 01/22/2024, 4:22 PM

## 2024-01-22 NOTE — Progress Notes (Signed)
 Inpatient Rehab Admissions Coordinator:    I met with Pt. And wife to discuss potential CIR admission.  They are considering SNF vs  CIR and want to think it over and talk again tomorrow. I will speak with them in the morning.   Wandalee Gust, MS, CCC-SLP Rehab Admissions Coordinator  [phone] (celll) [phone] (office)

## 2024-01-22 NOTE — Plan of Care (Signed)

## 2024-01-23 DIAGNOSIS — S72001A Fracture of unspecified part of neck of right femur, initial encounter for closed fracture: Secondary | ICD-10-CM

## 2024-01-23 LAB — BASIC METABOLIC PANEL WITH GFR
Anion gap: 8 (ref 5–15)
BUN: 21 mg/dL (ref 8–23)
CO2: 23 mmol/L (ref 22–32)
Calcium: 8.4 mg/dL — ABNORMAL LOW (ref 8.9–10.3)
Chloride: 106 mmol/L (ref 98–111)
Creatinine, Ser: 0.88 mg/dL (ref 0.61–1.24)
GFR, Estimated: >60 mL/min (ref >60)
Glucose, Bld: 145 mg/dL — ABNORMAL HIGH (ref 70–99)
Potassium: 3.4 mmol/L — ABNORMAL LOW (ref 3.5–5.1)
Sodium: 137 mmol/L (ref 135–145)

## 2024-01-23 LAB — CBC WITH DIFFERENTIAL/PLATELET
Abs Immature Granulocytes: 0.02 10*3/uL (ref 0.00–0.07)
Basophils Absolute: 0 10*3/uL (ref 0.0–0.1)
Basophils Relative: 1 %
Eosinophils Absolute: 0 10*3/uL (ref 0.0–0.5)
Eosinophils Relative: 1 %
HCT: 28 % — ABNORMAL LOW (ref 39.0–52.0)
Hemoglobin: 9.3 g/dL — ABNORMAL LOW (ref 13.0–17.0)
Immature Granulocytes: 0 %
Lymphocytes Relative: 19 %
Lymphs Abs: 0.9 10*3/uL (ref 0.7–4.0)
MCH: 31.3 pg (ref 26.0–34.0)
MCHC: 33.2 g/dL (ref 30.0–36.0)
MCV: 94.3 fL (ref 80.0–100.0)
Monocytes Absolute: 0.4 10*3/uL (ref 0.1–1.0)
Monocytes Relative: 7 %
Neutro Abs: 3.5 10*3/uL (ref 1.7–7.7)
Neutrophils Relative %: 72 %
Platelets: 173 10*3/uL (ref 150–400)
RBC: 2.97 MIL/uL — ABNORMAL LOW (ref 4.22–5.81)
RDW: 15.1 % (ref 11.5–15.5)
WBC: 4.9 10*3/uL (ref 4.0–10.5)
nRBC: 0 % (ref 0.0–0.2)

## 2024-01-23 MED ORDER — POTASSIUM CHLORIDE CRYS ER 20 MEQ PO TBCR
40.0000 meq | EXTENDED_RELEASE_TABLET | Freq: Once | ORAL | Status: AC
  Administered 2024-01-23: 40 meq via ORAL
  Filled 2024-01-23: qty 2

## 2024-01-23 NOTE — Progress Notes (Signed)
 Physical Therapy Treatment Patient Details Name: Sean Lane MRN: [id number] DOB: Sep 13, 1927 Today's Date: 01/23/2024   History of Present Illness 89 yo male presents to ED 5/10 via EMS for fall. Pt sustained R hip fx, s/p R hip hemiarthroplasty anterior approach on 5/11. Postop course complicated by delirium, AKI. Pt with fall last week sustaining L4 endplate fx. PMH includes aflutter, DMII, diverticulosis, gout, HTN, HLD, OA, R UKR 2018, L TKA.    PT Comments  Pt received in supine and agreeable to session. Pt demonstrates improved bed mobility with increased cues and use of gait belt to manage RLE. Pt motivated to increase gait distance this session and is able to do so with standing rest breaks due to pain and fatigue. Pt demonstrates short steps initially, but is able to improve with cues. Pt continues to benefit from PT services to progress toward functional mobility goals.    If plan is discharge home, recommend the following: A lot of help with walking and/or transfers;A lot of help with bathing/dressing/bathroom   Can travel by private vehicle        Equipment Recommendations  None recommended by PT    Recommendations for Other Services       Precautions / Restrictions Precautions Precautions: Fall Restrictions Weight Bearing Restrictions Per Provider Order: Yes RLE Weight Bearing Per Provider Order: Weight bearing as tolerated     Mobility  Bed Mobility Overal bed mobility: Needs Assistance Bed Mobility: Supine to Sit     Supine to sit: Contact guard, HOB elevated, Used rails     General bed mobility comments: Pt instructed in use of gait belt for RLE management and is able to demonstrate with increased time/effort    Transfers Overall transfer level: Needs assistance Equipment used: Rolling walker (2 wheels) Transfers: Sit to/from Stand Sit to Stand: Min assist, From elevated surface           General transfer comment: From EOB with cues for hand  placement    Ambulation/Gait Ambulation/Gait assistance: Contact guard assist Gait Distance (Feet): 60 Feet Assistive device: Rolling walker (2 wheels) Gait Pattern/deviations: Step-through pattern, Decreased stride length, Antalgic, Decreased weight shift to right, Trunk flexed Gait velocity: decr     General Gait Details: Pt demonstrates short steps with limited RLE WB tolerance, however improves slightly with increased distance and cues for increased step length. chair follow for safety   Stairs             Wheelchair Mobility     Tilt Bed    Modified Rankin (Stroke Patients Only)       Balance Overall balance assessment: Needs assistance, History of Falls Sitting-balance support: No upper extremity supported, Feet supported Sitting balance-Leahy Scale: Fair Sitting balance - Comments: sitting EOB   Standing balance support: Bilateral upper extremity supported, During functional activity, Reliant on assistive device for balance Standing balance-Leahy Scale: Poor Standing balance comment: with RW support                            Communication Communication Communication: Impaired Factors Affecting Communication: Hearing impaired  Cognition Arousal: Alert Behavior During Therapy: WFL for tasks assessed/performed   PT - Cognitive impairments: Sequencing                         Following commands: Intact      Cueing Cueing Techniques: Verbal cues, Gestural cues  Exercises  General Comments        Pertinent Vitals/Pain Pain Assessment Pain Assessment: Faces Faces Pain Scale: Hurts even more Pain Location: R hip, thigh Pain Descriptors / Indicators: Sore, Grimacing, Guarding Pain Intervention(s): Monitored during session, Limited activity within patient's tolerance, Repositioned     PT Goals (current goals can now be found in the care plan section) Acute Rehab PT Goals PT Goal Formulation: With patient Time For Goal  Achievement: 02/04/24 Progress towards PT goals: Progressing toward goals    Frequency    Min 3X/week       AM-PAC PT "6 Clicks" Mobility   Outcome Measure  Help needed turning from your back to your side while in a flat bed without using bedrails?: A Little Help needed moving from lying on your back to sitting on the side of a flat bed without using bedrails?: A Little Help needed moving to and from a bed to a chair (including a wheelchair)?: A Little Help needed standing up from a chair using your arms (e.g., wheelchair or bedside chair)?: A Little Help needed to walk in hospital room?: A Little Help needed climbing 3-5 steps with a railing? : Total 6 Click Score: 16    End of Session Equipment Utilized During Treatment: Gait belt Activity Tolerance: Patient tolerated treatment well Patient left: with family/visitor present;with call bell/phone within reach;in chair Nurse Communication: Mobility status PT Visit Diagnosis: Other abnormalities of gait and mobility (R26.89)     Time: 2725-3664 PT Time Calculation (min) (ACUTE ONLY): 27 min  Charges:    $Gait Training: 8-22 mins $Therapeutic Activity: 8-22 mins PT General Charges $$ ACUTE PT VISIT: 1 Visit                     Michaelle Adolphus, PTA Acute Rehabilitation Services Secure Chat Preferred  [phone]    Michaelle Adolphus 01/23/2024, 1:28 PM

## 2024-01-23 NOTE — Progress Notes (Signed)
 Occupational Therapy Treatment Patient Details Name: Sean Lane MRN: [id number] DOB: 18-Aug-1928 Today's Date: 01/23/2024   History of present illness 89 yo male presents to ED 5/10 via EMS for fall. Pt sustained R hip fx, s/p R hip hemiarthroplasty anterior approach on 5/11. Postop course complicated by delirium, AKI. Pt with fall last week sustaining L4 endplate fx. PMH includes aflutter, DMII, diverticulosis, gout, HTN, HLD, OA, R UKR 2018, L TKA.   OT comments  Pt in recliner and agreeable to OT.  Mod assist for initial transfer from recliner, fading to min assist from [facility] at sink.  Pt stood with 1-0 hand support during ADLs with min assist but poor tolerance, completed basin bath sitting/standing with min assist for UB and max assist for LB. R hip discomfort throughout.  Provided geomat to improve comfort in recliner to increase OOB time.  Pt motivated for rehab.  Continue to recommend >3hrs/day inpatient setting.       If plan is discharge home, recommend the following:  A little help with walking and/or transfers;A lot of help with bathing/dressing/bathroom;Assistance with cooking/housework;Help with stairs or ramp for entrance;Assist for transportation   Equipment Recommendations  Other (comment) (defer)    Recommendations for Other Services Rehab consult    Precautions / Restrictions Precautions Precautions: Fall Restrictions Weight Bearing Restrictions Per Provider Order: Yes RLE Weight Bearing Per Provider Order: Weight bearing as tolerated       Mobility Bed Mobility               General bed mobility comments: OOB in recliner    Transfers Overall transfer level: Needs assistance Equipment used: Rolling walker (2 wheels) Transfers: Sit to/from Stand Sit to Stand: Min assist, Mod assist           General transfer comment: inital stand from recliner with mod assist to fully power up, once up on BSC at sink stood x 2 with min assist.  Cues for hand  placement, at times support for R LE mgmt.     Balance Overall balance assessment: Needs assistance, History of Falls Sitting-balance support: No upper extremity supported, Feet supported Sitting balance-Leahy Scale: Fair     Standing balance support: Bilateral upper extremity supported, No upper extremity supported, During functional activity Standing balance-Leahy Scale: Poor Standing balance comment: with RW support dynamically, stands for ADLs with up to min assist                           ADL either performed or assessed with clinical judgement   ADL Overall ADL's : Needs assistance/impaired     Grooming: Minimal assistance;Wash/dry hands;Standing   Upper Body Bathing: Minimal assistance;Sitting   Lower Body Bathing: Maximal assistance;Sit to/from stand   Upper Body Dressing : Minimal assistance;Sitting       Toilet Transfer: Minimal assistance;Moderate assistance Toilet Transfer Details (indicate cue type and reason): RW         Functional mobility during ADLs: Minimal assistance;Moderate assistance;Rolling walker (2 wheels)      Extremity/Trunk Assessment              Vision       Perception     Praxis     Communication Communication Communication: Impaired Factors Affecting Communication: Hearing impaired (has hearing aides now)   Cognition Arousal: Alert Behavior During Therapy: WFL for tasks assessed/performed Cognition: No apparent impairments  Following commands: Intact        Cueing   Cueing Techniques: Verbal cues, Gestural cues  Exercises      Shoulder Instructions       General Comments dressing intact, spouse supportive. placed geomat in recliner to increased comfort.    Pertinent Vitals/ Pain       Pain Assessment Pain Assessment: Faces Faces Pain Scale: Hurts little more Pain Location: R hip, thigh Pain Descriptors / Indicators: Sore, Grimacing, Guarding Pain  Intervention(s): Limited activity within patient's tolerance, Monitored during session, Repositioned, Premedicated before session  Home Living                                          Prior Functioning/Environment              Frequency  Min 2X/week        Progress Toward Goals  OT Goals(current goals can now be found in the care plan section)  Progress towards OT goals: Progressing toward goals  Acute Rehab OT Goals Patient Stated Goal: get to rehab OT Goal Formulation: With patient Time For Goal Achievement: 02/04/24 Potential to Achieve Goals: Good  Plan      Co-evaluation                 AM-PAC OT "6 Clicks" Daily Activity     Outcome Measure   Help from another person eating meals?: None Help from another person taking care of personal grooming?: A Little Help from another person toileting, which includes using toliet, bedpan, or urinal?: A Lot Help from another person bathing (including washing, rinsing, drying)?: A Lot Help from another person to put on and taking off regular upper body clothing?: A Little Help from another person to put on and taking off regular lower body clothing?: A Lot 6 Click Score: 16    End of Session Equipment Utilized During Treatment: Gait belt;Rolling walker (2 wheels)  OT Visit Diagnosis: Other abnormalities of gait and mobility (R26.89);Muscle weakness (generalized) (M62.81);Pain;History of falling (Z91.81) Pain - Right/Left: Right Pain - part of body: Hip   Activity Tolerance Patient tolerated treatment well   Patient Left in chair;with call bell/phone within reach;with chair alarm set   Nurse Communication Mobility status;Precautions        Time: 1610-9604 OT Time Calculation (min): 23 min  Charges: OT General Charges $OT Visit: 1 Visit OT Treatments $Self Care/Home Management : 23-37 mins  Sean Lane, OT Acute Rehabilitation Services Office [phone] Secure Chat Preferred     Sean Lane 01/23/2024, 1:58 PM
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
## 2024-01-23 NOTE — TOC CAGE-AID Note (Signed)
 Transition of Care [facility]) - CAGE-AID Screening  Patient Details  Name: Sean Lane MRN: [id number] Date of Birth: 06-21-1928  Clinical Narrative:  Patient denies any current alcohol and drug use, no need for substance abuse resources at this time.  CAGE-AID Screening:   Have You Ever Felt You Ought to Cut Down on Your Drinking or Drug Use?: No Have People Annoyed You By Critizing Your Drinking Or Drug Use?: No Have You Felt Bad Or Guilty About Your Drinking Or Drug Use?: No Have You Ever Had a Drink or Used Drugs First Thing In The Morning to Steady Your Nerves or to Get Rid of a Hangover?: No CAGE-AID Score: 0  Substance Abuse Education Offered: No

## 2024-01-23 NOTE — Progress Notes (Signed)
 [facility] Progress Note Patient: Sean Lane DGU:[id number] DOB: [birth year] DOA: 01/19/2024  DOS: the patient was seen and examined on 01/23/2024  Brief Hospital Course: PMH of HTN, HLD, type II DM, GERD, CAD, BPH.  Presented after a fall at home. Had a right femoral neck fracture.  Underwent hemiarthroplasty on 5/11. Now medically stable. Assessment and Plan: Right femoral neck fracture Definitive hemiarthroplasty 5/11-pain control Tylenol  500 every 6 first choice--Norco 1-2 tab 5 mg dose for moderate pain, 7.5 mg dose for severe pain, IV morphine  to be given if severe Pain seems moderately well-controlled on oral meds   AKI superimposed on CKD 3a Renal function improving.  Monitor   Anemia of expected blood loss Drop in hemoglobin expected-monitor trends with labs-transfusion threshold below8   Delirium Likely secondary to medications anesthesia pain and lack of sleep Now much improved and not requiring any further interventions- Resumed Cymbalta  20   Sick sinus syndrome Chronic A-fib CHADVASC >4  previously on Xarelto  10  Xarelto  10 daily resumed Not rate controlled on any agent   CAD with stent in 2009 HTN Continues lisinopril  10 now given azotemia amlodipine  2.5   BPH with LUTS Monitor.  Foley catheter discontinued.  Subjective: Pain well-controlled with no nausea no vomiting.  Physical Exam: General: in Mild distress, No Rash Cardiovascular: S1 and S2 Present, No Murmur Respiratory: Good respiratory effort, Bilateral Air entry present. No Crackles, No wheezes Abdomen: Bowel Sound present, No tenderness Extremities: No edema Neuro: Alert and oriented x3, no new focal deficit  Data Reviewed: I have Reviewed nursing notes, Vitals, and Lab results. Since last encounter, pertinent lab results CBC and BMP   . I have ordered test including CBC and BMP  .   Disposition: Status is: Inpatient Remains inpatient appropriate because: Awaiting  placement  rivaroxaban  (XARELTO ) tablet 10 mg Start: 01/21/24 1700 SCDs Start: 01/20/24 2051 rivaroxaban  (XARELTO ) tablet 10 mg   Family Communication: Wife at bedside Level of care: Telemetry Surgical   Vitals:   01/23/24 0408 01/23/24 0817 01/23/24 1501 01/23/24 1933  BP: (!) 130/58 137/60 (!) 111/55 (!) 156/92  Pulse: 76 72 72 (!) 101  Resp: 18 16  16   Temp: 99 F (37.2 C) 98.3 F (36.8 C) 98.4 F (36.9 C) 98 F (36.7 C)  TempSrc: Oral Oral Oral   SpO2: 95% 96% 96% 99%  Weight:      Height:         Author: Charlean Congress, MD 01/23/2024 8:38 PM  Please look on [email or web address] to find out who is on call.

## 2024-01-23 NOTE — Progress Notes (Signed)
 Inpatient Rehab Admissions Coordinator:    I spoke with pt. And wife and they want to pursue CIR admit. I do not have a bed today but will follow for potential admit pending bed availability.   Wandalee Gust, MS, CCC-SLP Rehab Admissions Coordinator  [phone] (celll) [phone] (office)

## 2024-01-23 NOTE — Plan of Care (Signed)

## 2024-01-24 ENCOUNTER — Other Ambulatory Visit: Payer: Self-pay

## 2024-01-24 ENCOUNTER — Inpatient Hospital Stay (HOSPITAL_COMMUNITY)
Admission: AD | Admit: 2024-01-24 | Discharge: 2024-02-05 | DRG: 560 | Disposition: A | Discharge status: Home Health | Source: Intra-hospital | Attending: Physical Medicine and Rehabilitation | Admitting: Physical Medicine and Rehabilitation

## 2024-01-24 ENCOUNTER — Encounter (HOSPITAL_COMMUNITY): Payer: Self-pay | Admitting: Physical Medicine and Rehabilitation

## 2024-01-24 DIAGNOSIS — I2510 Atherosclerotic heart disease of native coronary artery without angina pectoris: Secondary | ICD-10-CM | POA: Diagnosis present

## 2024-01-24 DIAGNOSIS — I951 Orthostatic hypotension: Secondary | ICD-10-CM | POA: Diagnosis present

## 2024-01-24 DIAGNOSIS — K5900 Constipation, unspecified: Secondary | ICD-10-CM | POA: Diagnosis present

## 2024-01-24 DIAGNOSIS — S72001S Fracture of unspecified part of neck of right femur, sequela: Principal | ICD-10-CM

## 2024-01-24 DIAGNOSIS — Z833 Family history of diabetes mellitus: Secondary | ICD-10-CM | POA: Diagnosis not present

## 2024-01-24 DIAGNOSIS — G4700 Insomnia, unspecified: Secondary | ICD-10-CM | POA: Insufficient documentation

## 2024-01-24 DIAGNOSIS — R296 Repeated falls: Secondary | ICD-10-CM | POA: Diagnosis present

## 2024-01-24 DIAGNOSIS — Z743 Need for continuous supervision: Secondary | ICD-10-CM | POA: Diagnosis not present

## 2024-01-24 DIAGNOSIS — Z96641 Presence of right artificial hip joint: Secondary | ICD-10-CM | POA: Diagnosis present

## 2024-01-24 DIAGNOSIS — R630 Anorexia: Secondary | ICD-10-CM | POA: Diagnosis present

## 2024-01-24 DIAGNOSIS — N401 Enlarged prostate with lower urinary tract symptoms: Secondary | ICD-10-CM | POA: Diagnosis present

## 2024-01-24 DIAGNOSIS — R350 Frequency of micturition: Secondary | ICD-10-CM | POA: Diagnosis present

## 2024-01-24 DIAGNOSIS — H903 Sensorineural hearing loss, bilateral: Secondary | ICD-10-CM | POA: Diagnosis present

## 2024-01-24 DIAGNOSIS — Z7901 Long term (current) use of anticoagulants: Secondary | ICD-10-CM | POA: Diagnosis not present

## 2024-01-24 DIAGNOSIS — Z6821 Body mass index (BMI) 21.0-21.9, adult: Secondary | ICD-10-CM

## 2024-01-24 DIAGNOSIS — S72091S Other fracture of head and neck of right femur, sequela: Principal | ICD-10-CM

## 2024-01-24 DIAGNOSIS — E785 Hyperlipidemia, unspecified: Secondary | ICD-10-CM | POA: Diagnosis present

## 2024-01-24 DIAGNOSIS — Z8249 Family history of ischemic heart disease and other diseases of the circulatory system: Secondary | ICD-10-CM

## 2024-01-24 DIAGNOSIS — Z87891 Personal history of nicotine dependence: Secondary | ICD-10-CM | POA: Diagnosis not present

## 2024-01-24 DIAGNOSIS — N390 Urinary tract infection, site not specified: Secondary | ICD-10-CM | POA: Diagnosis present

## 2024-01-24 DIAGNOSIS — I10 Essential (primary) hypertension: Secondary | ICD-10-CM | POA: Diagnosis present

## 2024-01-24 DIAGNOSIS — R609 Edema, unspecified: Secondary | ICD-10-CM | POA: Diagnosis not present

## 2024-01-24 DIAGNOSIS — Z794 Long term (current) use of insulin: Secondary | ICD-10-CM | POA: Diagnosis not present

## 2024-01-24 DIAGNOSIS — E119 Type 2 diabetes mellitus without complications: Secondary | ICD-10-CM | POA: Diagnosis not present

## 2024-01-24 DIAGNOSIS — Z96653 Presence of artificial knee joint, bilateral: Secondary | ICD-10-CM | POA: Diagnosis present

## 2024-01-24 DIAGNOSIS — F05 Delirium due to known physiological condition: Secondary | ICD-10-CM | POA: Insufficient documentation

## 2024-01-24 DIAGNOSIS — N3000 Acute cystitis without hematuria: Secondary | ICD-10-CM | POA: Diagnosis not present

## 2024-01-24 DIAGNOSIS — M62838 Other muscle spasm: Secondary | ICD-10-CM | POA: Diagnosis present

## 2024-01-24 DIAGNOSIS — R5381 Other malaise: Secondary | ICD-10-CM | POA: Diagnosis not present

## 2024-01-24 DIAGNOSIS — E876 Hypokalemia: Secondary | ICD-10-CM | POA: Diagnosis present

## 2024-01-24 DIAGNOSIS — D62 Acute posthemorrhagic anemia: Secondary | ICD-10-CM | POA: Diagnosis present

## 2024-01-24 DIAGNOSIS — W1830XS Fall on same level, unspecified, sequela: Secondary | ICD-10-CM

## 2024-01-24 DIAGNOSIS — S79929A Unspecified injury of unspecified thigh, initial encounter: Secondary | ICD-10-CM | POA: Diagnosis not present

## 2024-01-24 DIAGNOSIS — M79604 Pain in right leg: Secondary | ICD-10-CM | POA: Diagnosis not present

## 2024-01-24 DIAGNOSIS — M25422 Effusion, left elbow: Secondary | ICD-10-CM | POA: Diagnosis not present

## 2024-01-24 LAB — CBC WITH DIFFERENTIAL/PLATELET
Abs Immature Granulocytes: 0.03 10*3/uL (ref 0.00–0.07)
Basophils Absolute: 0.1 10*3/uL (ref 0.0–0.1)
Basophils Relative: 1 %
Eosinophils Absolute: 0.1 10*3/uL (ref 0.0–0.5)
Eosinophils Relative: 2 %
HCT: 30.6 % — ABNORMAL LOW (ref 39.0–52.0)
Hemoglobin: 10.1 g/dL — ABNORMAL LOW (ref 13.0–17.0)
Immature Granulocytes: 1 %
Lymphocytes Relative: 20 %
Lymphs Abs: 0.9 10*3/uL (ref 0.7–4.0)
MCH: 31.5 pg (ref 26.0–34.0)
MCHC: 33 g/dL (ref 30.0–36.0)
MCV: 95.3 fL (ref 80.0–100.0)
Monocytes Absolute: 0.3 10*3/uL (ref 0.1–1.0)
Monocytes Relative: 6 %
Neutro Abs: 3.3 10*3/uL (ref 1.7–7.7)
Neutrophils Relative %: 70 %
Platelets: 203 10*3/uL (ref 150–400)
RBC: 3.21 MIL/uL — ABNORMAL LOW (ref 4.22–5.81)
RDW: 14.9 % (ref 11.5–15.5)
WBC: 4.7 10*3/uL (ref 4.0–10.5)
nRBC: 0 % (ref 0.0–0.2)

## 2024-01-24 LAB — BASIC METABOLIC PANEL WITH GFR
Anion gap: 7 (ref 5–15)
BUN: 18 mg/dL (ref 8–23)
CO2: 26 mmol/L (ref 22–32)
Calcium: 9.1 mg/dL (ref 8.9–10.3)
Chloride: 105 mmol/L (ref 98–111)
Creatinine, Ser: 0.82 mg/dL (ref 0.61–1.24)
GFR, Estimated: >60 mL/min (ref >60)
Glucose, Bld: 141 mg/dL — ABNORMAL HIGH (ref 70–99)
Potassium: 4.4 mmol/L (ref 3.5–5.1)
Sodium: 138 mmol/L (ref 135–145)

## 2024-01-24 MED ORDER — PROCHLORPERAZINE 25 MG RE SUPP: 12.5000 mg | Freq: Four times a day (QID) | RECTAL | Status: DC | PRN

## 2024-01-24 MED ORDER — MENTHOL 3 MG MT LOZG: 1.0000 | LOZENGE | OROMUCOSAL | Status: DC | PRN

## 2024-01-24 MED ORDER — PROCHLORPERAZINE MALEATE 5 MG PO TABS: 5.0000 mg | ORAL_TABLET | Freq: Four times a day (QID) | ORAL | Status: DC | PRN

## 2024-01-24 MED ORDER — RIVAROXABAN 10 MG PO TABS
10.0000 mg | ORAL_TABLET | Freq: Every day | ORAL | Status: DC
  Administered 2024-01-25 – 2024-02-04 (×11): 10 mg via ORAL
  Filled 2024-01-24 (×11): qty 1

## 2024-01-24 MED ORDER — ROSUVASTATIN CALCIUM 5 MG PO TABS
10.0000 mg | ORAL_TABLET | Freq: Every day | ORAL | Status: DC
  Administered 2024-01-25 – 2024-02-04 (×11): 10 mg via ORAL
  Filled 2024-01-24 (×10): qty 2

## 2024-01-24 MED ORDER — HYDROCODONE-ACETAMINOPHEN 5-325 MG PO TABS
1.0000 | ORAL_TABLET | ORAL | Status: DC | PRN
  Administered 2024-01-26 – 2024-01-30 (×7): 1 via ORAL
  Filled 2024-01-24 (×9): qty 1

## 2024-01-24 MED ORDER — BISACODYL 10 MG RE SUPP: 10.0000 mg | Freq: Every day | RECTAL | Status: DC | PRN

## 2024-01-24 MED ORDER — ALUM & MAG HYDROXIDE-SIMETH 200-200-20 MG/5ML PO SUSP: 30.0000 mL | ORAL | Status: DC | PRN

## 2024-01-24 MED ORDER — AMLODIPINE BESYLATE 2.5 MG PO TABS
2.5000 mg | ORAL_TABLET | Freq: Every day | ORAL | Status: DC
  Administered 2024-01-25: 2.5 mg via ORAL
  Filled 2024-01-24: qty 1

## 2024-01-24 MED ORDER — ACETAMINOPHEN 325 MG PO TABS
325.0000 mg | ORAL_TABLET | ORAL | Status: DC | PRN
  Administered 2024-01-24: 650 mg via ORAL
  Administered 2024-01-25: 500 mg via ORAL
  Administered 2024-01-25 – 2024-02-05 (×5): 650 mg via ORAL
  Filled 2024-01-24 (×8): qty 2

## 2024-01-24 MED ORDER — HYDROCODONE-ACETAMINOPHEN 7.5-325 MG PO TABS: 2.0000 | ORAL_TABLET | ORAL | Status: DC | PRN

## 2024-01-24 MED ORDER — MELATONIN 5 MG PO TABS
5.0000 mg | ORAL_TABLET | Freq: Every evening | ORAL | Status: DC | PRN
  Administered 2024-01-26 – 2024-01-30 (×4): 5 mg via ORAL
  Filled 2024-01-24 (×4): qty 1

## 2024-01-24 MED ORDER — PROCHLORPERAZINE EDISYLATE 10 MG/2ML IJ SOLN: 5.0000 mg | Freq: Four times a day (QID) | INTRAMUSCULAR | Status: DC | PRN

## 2024-01-24 MED ORDER — FLEET ENEMA RE ENEM: 1.0000 | ENEMA | Freq: Once | RECTAL | Status: DC | PRN

## 2024-01-24 MED ORDER — GUAIFENESIN-DM 100-10 MG/5ML PO SYRP: 5.0000 mL | ORAL_SOLUTION | Freq: Four times a day (QID) | ORAL | Status: DC | PRN

## 2024-01-24 MED ORDER — DULOXETINE HCL 20 MG PO CPEP
20.0000 mg | ORAL_CAPSULE | Freq: Every day | ORAL | Status: DC
  Administered 2024-01-25 – 2024-02-05 (×12): 20 mg via ORAL
  Filled 2024-01-24 (×12): qty 1

## 2024-01-24 MED ORDER — SENNOSIDES-DOCUSATE SODIUM 8.6-50 MG PO TABS
2.0000 | ORAL_TABLET | Freq: Two times a day (BID) | ORAL | Status: DC
  Administered 2024-01-24 – 2024-02-05 (×20): 2 via ORAL
  Filled 2024-01-24 (×22): qty 2

## 2024-01-24 MED ORDER — ACETAMINOPHEN 500 MG PO TABS
500.0000 mg | ORAL_TABLET | Freq: Four times a day (QID) | ORAL | Status: DC
  Administered 2024-01-24: 500 mg via ORAL
  Filled 2024-01-24 (×2): qty 1

## 2024-01-24 MED ORDER — METHOCARBAMOL 500 MG PO TABS
500.0000 mg | ORAL_TABLET | Freq: Four times a day (QID) | ORAL | Status: DC | PRN
  Administered 2024-01-26 – 2024-01-28 (×3): 500 mg via ORAL
  Filled 2024-01-24 (×5): qty 1

## 2024-01-24 MED ORDER — DIPHENHYDRAMINE HCL 25 MG PO CAPS: 25.0000 mg | ORAL_CAPSULE | Freq: Four times a day (QID) | ORAL | Status: DC | PRN

## 2024-01-24 MED ORDER — POLYETHYLENE GLYCOL 3350 17 G PO PACK: 17.0000 g | PACK | Freq: Every day | ORAL | Status: DC | PRN

## 2024-01-24 MED ORDER — DOCUSATE SODIUM 100 MG PO CAPS: 100.0000 mg | ORAL_CAPSULE | Freq: Two times a day (BID) | ORAL | 0 refills | Status: DC

## 2024-01-24 MED ORDER — PHENOL 1.4 % MT LIQD
1.0000 | OROMUCOSAL | Status: DC | PRN
Start: 2024-01-24 — End: 2024-02-05

## 2024-01-24 MED ORDER — LISINOPRIL 5 MG PO TABS
10.0000 mg | ORAL_TABLET | Freq: Every day | ORAL | Status: DC
  Administered 2024-01-25: 10 mg via ORAL
  Filled 2024-01-24: qty 2

## 2024-01-24 MED ORDER — LISINOPRIL 10 MG PO TABS: 10.0000 mg | ORAL_TABLET | Freq: Every day | ORAL | 0 refills | Status: DC

## 2024-01-24 MED ORDER — SORBITOL 70 % SOLN
60.0000 mL | Freq: Once | Status: AC
Start: 2024-01-24 — End: 2024-01-24
  Administered 2024-01-24: 60 mL via ORAL
  Filled 2024-01-24: qty 60
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
## 2024-01-24 NOTE — Plan of Care (Signed)

## 2024-01-24 NOTE — Discharge Summary (Signed)
 Physician Discharge Summary   Patient: Sean Lane MRN: [id number] DOB: [birth year]  Admit date:     01/19/2024  Discharge date: 01/24/24  Discharge Physician: Charlean Congress  PCP: Almira Jaeger, MD  Recommendations at discharge: Repeat CBC in 1 week  Follow up with Orthopedics as recommended May Need further adjustment of lisinopril  dose if needed.   Follow-up Information     [facility], [place]. Schedule an appointment as soon as possible for a visit in 2 week(s).   Specialty: Orthopedic Surgery Why: For suture removal, For wound re-check Contact information: [address] [place] [address] [phone]         Almira Jaeger, MD. Schedule an appointment as soon as possible for a visit in 1 week(s).   Specialty: Family Medicine Why: with CBC lab to look at blood counts Contact information: [address] [place] [address] [phone]                Discharge Diagnoses: Principal Problem:   Femur fracture (HCC) Active Problems:   CAD S/P percutaneous coronary angioplasty   Hyperlipidemia   Essential hypertension   SINUS BRADYCARDIA   GERD   BPH associated with nocturia   Atrial flutter (HCC)   Type 2 diabetes mellitus with CAD(HCC)   Fracture of femoral neck, right, closed [facility])  Hospital Course: PMH of HTN, HLD, type II DM, GERD, CAD, BPH.  Presented after a fall at home. Had a right femoral neck fracture.  Underwent hemiarthroplasty on 5/11. Now medically stable.  Assessment and Plan: Right femoral neck fracture S/P hemiarthroplasty 5/11 pain control per Orthopedics  Pain seems moderately well-controlled on oral meds On Xarelto  chronically.     Post op acute blood loss Anemia expected blood loss after surgery  Hb remains stable  Delirium Likely secondary to medications anesthesia pain and lack of sleep Now much improved and not requiring any further interventions- Resumed Cymbalta  20   Sick sinus  syndrome Chronic A-fib CHADVASC >4  Xarelto  10 daily resumed Not rate controlled on any agent   CAD with stent in 2009 HTN Continues lisinopril  10. Dose reduced and actually blood pressure remains stable  For now will continue the med, but was on 40 mg dose before admission    BPH with LUTS Monitor.  Foley catheter discontinued.  Pt does not have AKI or CKDIIIa.  Renal function remaining stable   Pain control - [place]  Controlled Substance Reporting System database was reviewed. and patient was instructed, not to drive, operate heavy machinery, perform activities at heights, swimming or participation in water  activities or provide baby-sitting services while on Pain, Sleep and Anxiety Medications; until their outpatient Physician has advised to do so again. Also recommended to not to take more than prescribed Pain, Sleep and Anxiety Medications.  Consultants:  Orthopedics   Procedures performed:  Right hip hemiarthroplasty, anterior approach.   DISCHARGE MEDICATION: Allergies as of 01/24/2024       Reactions   Dilaudid [hydromorphone Hcl] Other (See Comments)   Agitation   Nizoral  [ketoconazole ] Rash   Sulfonamide Derivatives Rash        Medication List     STOP taking these medications    traMADol  50 MG tablet Commonly known as: ULTRAM        TAKE these medications    acetaminophen  650 MG CR tablet Commonly known as: TYLENOL  Take 1,300 mg by mouth 3 (three) times daily.   amLODipine  2.5 MG tablet Commonly known  as: NORVASC  Take 1 tablet (2.5 mg total) by mouth daily.   aspirin  EC 81 MG tablet Take 81 mg by mouth daily.   docusate sodium  100 MG capsule Commonly known as: COLACE Take 1 capsule (100 mg total) by mouth 2 (two) times daily.   DULoxetine  20 MG capsule Commonly known as: CYMBALTA  TAKE 1 CAPSULE DAILY   HYDROcodone-acetaminophen  5-325 MG tablet Commonly known as: NORCO/VICODIN Take 1 tablet by mouth every 4 (four) hours as needed for  up to 7 days for severe pain (pain score 7-10).   lisinopril  10 MG tablet Commonly known as: ZESTRIL  Take 1 tablet (10 mg total) by mouth daily. Start taking on: Jan 25, 2024 What changed:  medication strength how much to take   rivaroxaban  20 MG Tabs tablet Commonly known as: XARELTO  Take 10 mg by mouth daily with supper.   rosuvastatin  10 MG tablet Commonly known as: CRESTOR  TAKE 1 TABLET DAILY What changed: when to take this   triamcinolone  cream 0.1 % Commonly known as: KENALOG  Apply 1 Application topically 2 (two) times daily. For 7-10 days maximum for rash on ankle   VITAMIN B-12 PO Take 1 tablet by mouth daily.   VITAMIN D-3 PO Take 1 capsule by mouth daily with supper.       Disposition: CIR Diet recommendation: Carb modified diet  Discharge Exam: Vitals:   01/23/24 1933 01/23/24 2110 01/24/24 0549 01/24/24 0727  BP: (!) 156/92 138/62 (!) 146/65 (!) 143/56  Pulse: (!) 101 69 68 (!) 58  Resp: 16 18 16 17   Temp: 98 F (36.7 C) 98 F (36.7 C) 98.5 F (36.9 C) 98.3 F (36.8 C)  TempSrc:  Oral Oral   SpO2: 99% 98% 97% 98%  Weight:      Height:       General: Appear in mild distress; no visible Abnormal Neck Mass Or lumps, Conjunctiva normal Cardiovascular: S1 and S2 Present, no Murmur, Respiratory: good respiratory effort, Bilateral Air entry present and CTA, no Crackles, no wheezes Abdomen: Bowel Sound present, Non tender  Extremities: no Pedal edema Neurology: alert and oriented to time, place, and person  [facility] Weights   01/19/24 0809  Weight: 75.3 kg   Condition at discharge: stable  The results of significant diagnostics from this hospitalization (including imaging, microbiology, ancillary and laboratory) are listed below for reference.   Imaging Studies: DG HIP UNILAT W OR W/O PELVIS 2-3 VIEWS RIGHT Result Date: 01/20/2024 CLINICAL DATA:  Postop. EXAM: DG HIP (WITH OR WITHOUT PELVIS) 2-3V RIGHT COMPARISON:  None Available. FINDINGS: Right  hip arthroplasty in expected alignment. No periprosthetic lucency or fracture. Cerclage wire about the femoral stem. Recent postsurgical change includes air and edema in the soft tissues. Lateral skin staples in place. IMPRESSION: Right hip arthroplasty without immediate postoperative complication. Electronically Signed   By: Chadwick Colonel M.D.   On: 01/20/2024 20:14   DG HIP UNILAT WITH PELVIS 1V RIGHT Result Date: 01/20/2024 CLINICAL DATA:  Elective surgery. EXAM: DG HIP (WITH OR WITHOUT PELVIS) 1V RIGHT COMPARISON:  Preoperative imaging. FINDINGS: Seven fluoroscopic spot views of the pelvis and right hip obtained in the operating room. Sequential images during hip arthroplasty. Fluoroscopy time 10.6 seconds. Dose 0.4816 mGy. IMPRESSION: Intraoperative fluoroscopy during right hip arthroplasty. Electronically Signed   By: Chadwick Colonel M.D.   On: 01/20/2024 20:14   DG C-Arm 1-60 Min-No Report Result Date: 01/20/2024 Fluoroscopy was utilized by the requesting physician.  No radiographic interpretation.   DG C-Arm 1-60 Min-No  Report Result Date: 01/20/2024 Fluoroscopy was utilized by the requesting physician.  No radiographic interpretation.   DG HIP PORT UNILAT WITH PELVIS 1V RIGHT Result Date: 01/19/2024 CLINICAL DATA:  Right hip pain. EXAM: DG HIP (WITH OR WITHOUT PELVIS) 1V PORT RIGHT COMPARISON:  None Available. FINDINGS: There is no evidence of acute hip fracture or dislocation. Degenerative changes are seen involving both hips in the form of joint space narrowing and acetabular sclerosis. Small radiopaque surgical/vascular coils are seen within the lower pelvis on the right. IMPRESSION: Degenerative changes without evidence of acute osseous abnormality. Electronically Signed   By: Virgle Grime M.D.   On: 01/19/2024 17:04   CT NO CHARGE Result Date: 01/19/2024 CLINICAL DATA:  Bony pelvis reconstructions were performed for evaluation of the right femur fracture at no charge to the  patient. EXAM: CT ADDITIONAL VIEWS AT NO CHARGE TECHNIQUE: Bony reconstruction of the pelvis was performed from the multidetector CT imaging of the abdomen and pelvis performed earlier same day using the standard protocol following bolus administration of intravenous contrast. CONTRAST:  75mL OMNIPAQUE  IOHEXOL  350 MG/ML SOLN COMPARISON:  Same day CT abdomen and pelvis FINDINGS: Please see separately dictated CT abdomen and pelvis report for detailed findings of the soft tissue. Reconstruction of the pelvis again demonstrates nondisplaced fracture lucency through the right femoral neck. No acute dislocation. Irregular sclerotic focus within the right superior pubic ramus, likely bone island. IMPRESSION: Nondisplaced fracture lucency through the right femoral neck. No other pelvic fractures demonstrated. Electronically Signed   By: Limin  Xu M.D.   On: 01/19/2024 15:03   DG Knee Right Port Result Date: 01/19/2024 CLINICAL DATA:  Closed displaced fracture of right femoral neck. Right leg pain EXAM: PORTABLE RIGHT KNEE - 1-2 VIEW COMPARISON:  None Available. FINDINGS: Medial compartment hemiarthroplasty in expected alignment. No acute or periprosthetic fracture. Minor patellofemoral spurring. Faint chondrocalcinosis. Minimal joint effusion. Peripheral vascular calcifications are seen. IMPRESSION: 1. Medial compartment hemiarthroplasty without complication. 2. Minor patellofemoral spurring. 3. Chondrocalcinosis. Electronically Signed   By: Chadwick Colonel M.D.   On: 01/19/2024 14:43   CT Head Wo Contrast Result Date: 01/19/2024 CLINICAL DATA:  Neck trauma due to fall. EXAM: CT HEAD WITHOUT CONTRAST CT CERVICAL SPINE WITHOUT CONTRAST TECHNIQUE: Multidetector CT imaging of the head and cervical spine was performed following the standard protocol without intravenous contrast. Multiplanar CT image reconstructions of the cervical spine were also generated. RADIATION DOSE REDUCTION: This exam was performed according to  the departmental dose-optimization program which includes automated exposure control, adjustment of the mA and/or kV according to patient size and/or use of iterative reconstruction technique. COMPARISON:  None Available. FINDINGS: CT HEAD FINDINGS Brain: No evidence of acute infarction, hemorrhage, hydrocephalus, extra-axial collection or mass lesion/mass effect. Generalized brain atrophy Vascular: No hyperdense vessel or unexpected calcification. Skull: Normal. Negative for fracture or focal lesion. Sinuses/Orbits: No evidence of injury CT CERVICAL SPINE FINDINGS Alignment: No traumatic malalignment. Skull base and vertebrae: No acute fracture. No primary bone lesion or focal pathologic process. Generalized osteopenia. Soft tissues and spinal canal: No prevertebral fluid or swelling. No visible canal hematoma. Disc levels: Generalized degenerative endplate and facet spurring with multilevel ankylosis. Upper chest: No acute finding IMPRESSION: No evidence of acute intracranial or cervical spine injury. Electronically Signed   By: Ronnette Coke M.D.   On: 01/19/2024 11:52   CT Cervical Spine Wo Contrast Result Date: 01/19/2024 CLINICAL DATA:  Neck trauma due to fall. EXAM: CT HEAD WITHOUT CONTRAST CT CERVICAL SPINE WITHOUT CONTRAST  TECHNIQUE: Multidetector CT imaging of the head and cervical spine was performed following the standard protocol without intravenous contrast. Multiplanar CT image reconstructions of the cervical spine were also generated. RADIATION DOSE REDUCTION: This exam was performed according to the departmental dose-optimization program which includes automated exposure control, adjustment of the mA and/or kV according to patient size and/or use of iterative reconstruction technique. COMPARISON:  None Available. FINDINGS: CT HEAD FINDINGS Brain: No evidence of acute infarction, hemorrhage, hydrocephalus, extra-axial collection or mass lesion/mass effect. Generalized brain atrophy Vascular: No  hyperdense vessel or unexpected calcification. Skull: Normal. Negative for fracture or focal lesion. Sinuses/Orbits: No evidence of injury CT CERVICAL SPINE FINDINGS Alignment: No traumatic malalignment. Skull base and vertebrae: No acute fracture. No primary bone lesion or focal pathologic process. Generalized osteopenia. Soft tissues and spinal canal: No prevertebral fluid or swelling. No visible canal hematoma. Disc levels: Generalized degenerative endplate and facet spurring with multilevel ankylosis. Upper chest: No acute finding IMPRESSION: No evidence of acute intracranial or cervical spine injury. Electronically Signed   By: Ronnette Coke M.D.   On: 01/19/2024 11:52   CT ABDOMEN PELVIS W CONTRAST Result Date: 01/19/2024 CLINICAL DATA:  Acute nonlocalized abdominal pain.  Trauma.  Fall EXAM: CT ABDOMEN AND PELVIS WITH CONTRAST TECHNIQUE: Multidetector CT imaging of the abdomen and pelvis was performed using the standard protocol following bolus administration of intravenous contrast. RADIATION DOSE REDUCTION: This exam was performed according to the departmental dose-optimization program which includes automated exposure control, adjustment of the mA and/or kV according to patient size and/or use of iterative reconstruction technique. CONTRAST:  75mL OMNIPAQUE  IOHEXOL  350 MG/ML SOLN COMPARISON:  Pelvic CTA 06/07/2023 FINDINGS: Lower chest: There is some linear opacity lung bases likely scar or atelectasis. No pleural effusion. Coronary artery calcifications are seen. Small hiatal hernia. Hepatobiliary: Gallbladder is present with some question of dependent small stones. Liver has several small low-attenuation cystic areas which are too small to completely characterize but statistically likely benign. No specific imaging follow-up. Patent portal vein. Pancreas: Global atrophy of the pancreas. Spleen: Spleen is slightly enlarged at 13 cm in AP dimension. Homogeneous enhancement. Adrenals/Urinary Tract: The  adrenal glands are preserved. No right-sided enhancing renal mass. Tiny Bosniak 2 upper pole right-sided renal cystic focus. Mild atrophy left kidney. There is a lower pole left-sided renal stone identified measuring 13 mm. Separate punctate stone more caudal as well. Mild left-sided renal collecting system ectasia with the ectatic ureter extending down to the UVJ. No additional ureteral stones. Delayed imaging is only of the kidneys. Preserved contour to the urinary bladder. There is an enlarged prostate with dystrophic calcifications. Mass effect along the base of the bladder. Please correlate with patient's PSA and symptoms of BPH. Slight J hooking of the distal ureters. Stomach/Bowel: Large bowel has scattered colonic stool. Normal retrocecal appendix. Colonic diverticulosis identified diffusely greatest of the sigmoid colon. Stomach is underdistended. Small bowel is nondilated. Vascular/Lymphatic: Normal caliber IVC. There is some small upper abdominal retroperitoneal nodes identified, not pathologic by size criteria. Diffuse calcified plaque along the aorta and branch vessels. There are areas of short segment non flow min areas of dissection suggested along the distal abdominal aorta. This also some areas along the common iliac arteries. Significant plaque along the common iliac arteries as well with areas of potential significant stenosis. Please correlate for any particular symptoms of claudication. Reproductive: Enlarged prostate once again with mass effect along the bladder and dystrophic calcifications. Please correlate for signs of BPH and the patient's  PSA Other: Small fat containing inguinal hernias. No free intra-abdominal air or free fluid. Of note there is artifact as the patient's arms were scanned across the abdomen and pelvis. Musculoskeletal: Diffuse degenerative changes are seen along the spine and pelvis. Mixed areas ease lucency and sclerosis. There are several fractures identified. This  includes a nondisplaced fracture of the left femoral neck best seen on coronal image 64 of series 11 image. Axial image 88 of series 7. This is not visible on the prior pelvis x-ray of 01/14/2024. Compression fracture of L4. Fracture line along the superior endplate which is compressed. There is also chest in of fracture lines extending along the lateral margins of the vertebral body. IMPRESSION: Compression fracture of L4. Nondisplaced right femoral neck fracture. These appear acute. Colonic diverticulosis. No bowel obstruction, free air or free fluid. Normal appendix. Distended gallbladder with some some small dependent stones suggested. Nonobstructing left-sided renal stone measuring 13 mm. Separate punctate stone. There is mild to moderate collecting system dilatation left diffusely down to the level of the bladder. No distal ureteral stones are seen. Etiology of this is uncertain. Please correlate for any known history. There was an ectatic ureter distally on the prior study of September 2024 but today peers increased from that exam. There is an enlarged prostate with mass effect along the bladder with dystrophic calcifications. Please correlate with the patient's PSA and signs of BPH. Small hiatal hernia. Extensive vascular calcifications identified with potential areas of significant stenosis along the iliac vessels. Please correlate with symptoms. Mild splenic enlargement. Electronically Signed   By: Adrianna Horde M.D.   On: 01/19/2024 11:33   CT Thoracic Spine Wo Contrast Result Date: 01/19/2024 CLINICAL DATA:  Low back pain, trauma; Mid-back pain EXAM: CT THORACIC, AND LUMBAR SPINE WITHOUT CONTRAST TECHNIQUE: Multidetector CT imaging of the thoracic and lumbar spine was performed without intravenous contrast. Multiplanar CT image reconstructions were also generated. RADIATION DOSE REDUCTION: This exam was performed according to the departmental dose-optimization program which includes automated exposure  control, adjustment of the mA and/or kV according to patient size and/or use of iterative reconstruction technique. COMPARISON:  None Available. FINDINGS: CT THORACIC SPINE FINDINGS Alignment: Normal. Vertebrae: Vertebral body heights are maintained. No evidence of acute fracture. Osteopenia. Paraspinal and other soft tissues: No evidence of acute abnormality. Disc levels: Bridging osteophytes at multiple levels. CT LUMBAR SPINE FINDINGS Alignment: Grade 1 anterolisthesis of L4 on L5. Probably degenerative given degenerative changes at this level. Vertebrae: Acute fracture through the L4 superior endplate with visible lucency and approximately 20% height loss. Osteopenia. Paraspinal and other soft tissues: Please see same day CT of the abdomen/pelvis for intra-abdominal and intrapelvic evaluation. Disc levels: Severe lower lumbar facet arthropathy, greatest at L4-L5 where there is degenerative grade 1 anterolisthesis. Also disc bulging and ligamentum flavum thickening this level with at least mild canal stenosis. Moderate multilevel degenerative disc disease. L2 inferior endplate degenerative Schmorl's node. IMPRESSION: Acute L4 superior endplate fracture with 20% height loss. No bony retropulsion. Electronically Signed   By: Stevenson Elbe M.D.   On: 01/19/2024 11:29   CT L-SPINE NO CHARGE Result Date: 01/19/2024 CLINICAL DATA:  Low back pain, trauma; Mid-back pain EXAM: CT THORACIC, AND LUMBAR SPINE WITHOUT CONTRAST TECHNIQUE: Multidetector CT imaging of the thoracic and lumbar spine was performed without intravenous contrast. Multiplanar CT image reconstructions were also generated. RADIATION DOSE REDUCTION: This exam was performed according to the departmental dose-optimization program which includes automated exposure control, adjustment of the mA and/or  kV according to patient size and/or use of iterative reconstruction technique. COMPARISON:  None Available. FINDINGS: CT THORACIC SPINE FINDINGS  Alignment: Normal. Vertebrae: Vertebral body heights are maintained. No evidence of acute fracture. Osteopenia. Paraspinal and other soft tissues: No evidence of acute abnormality. Disc levels: Bridging osteophytes at multiple levels. CT LUMBAR SPINE FINDINGS Alignment: Grade 1 anterolisthesis of L4 on L5. Probably degenerative given degenerative changes at this level. Vertebrae: Acute fracture through the L4 superior endplate with visible lucency and approximately 20% height loss. Osteopenia. Paraspinal and other soft tissues: Please see same day CT of the abdomen/pelvis for intra-abdominal and intrapelvic evaluation. Disc levels: Severe lower lumbar facet arthropathy, greatest at L4-L5 where there is degenerative grade 1 anterolisthesis. Also disc bulging and ligamentum flavum thickening this level with at least mild canal stenosis. Moderate multilevel degenerative disc disease. L2 inferior endplate degenerative Schmorl's node. IMPRESSION: Acute L4 superior endplate fracture with 20% height loss. No bony retropulsion. Electronically Signed   By: Stevenson Elbe M.D.   On: 01/19/2024 11:29   DG Pelvis 1-2 Views Result Date: 01/15/2024 CLINICAL DATA:  Right hip pain.  Patient reports fall last week. EXAM: PELVIS - 1-2 VIEW COMPARISON:  None Available. FINDINGS: The bones are subjectively under mineralized. The cortical margins of the bony pelvis are intact. No fracture. Pubic symphysis and sacroiliac joints are congruent. Mild sacroiliac degenerative change. Both femoral heads are well-seated in the respective acetabula. Bilateral hip joint space narrowing with chondrocalcinosis on the left. IMPRESSION: 1. No fracture of the pelvis. 2. Mild bilateral hip osteoarthritis with chondrocalcinosis on the left. Electronically Signed   By: Chadwick Colonel M.D.   On: 01/15/2024 11:17   DG Lumbar Spine 2-3 Views Result Date: 01/15/2024 CLINICAL DATA:  Lumbar spine pain. Low back and hip pain. Patient reports fall last  week. EXAM: LUMBAR SPINE - 2-3 VIEW COMPARISON:  Reformats from abdominopelvic CT 02/15/2023 FINDINGS: Five non-rib-bearing lumbar vertebra. Mild broad-based levo scoliotic curvature. The bones are subjectively under mineralized. Mild L4 superior endplate compression deformity with approximately 20% loss of height centrally. No posterior cortex involvement. Grade 1 anterolisthesis of L4 on L5, likely facet mediated. Anterior spurring at multiple levels with slight L4-L5 disc space narrowing. There is moderate L4-L5 and L5-S1 facet hypertrophy. Mild degenerative change of the sacroiliac joints IMPRESSION: 1. Mild L4 superior endplate compression deformity with approximately 20% loss of height centrally, possibly acute. This is new from 02/15/2023. 2. Moderate facet hypertrophy at L4-L5 and L5-S1. 3. Grade 1 anterolisthesis of L4 on L5, likely facet mediated. Electronically Signed   By: Chadwick Colonel M.D.   On: 01/15/2024 11:16    Microbiology: Results for orders placed or performed during the hospital encounter of 01/19/24  Surgical pcr screen     Status: None   Collection Time: 01/20/24  5:04 AM   Specimen: Nasal Mucosa; Nasal Swab  Result Value Ref Range Status   MRSA, PCR NEGATIVE NEGATIVE Final   Staphylococcus aureus NEGATIVE NEGATIVE Final    Comment: (NOTE) The Xpert SA Assay (FDA approved for NASAL specimens in patients 75 years of age and older), is one component of a comprehensive surveillance program. It is not intended to diagnose infection nor to guide or monitor treatment. Performed at [facility], [address] [place], [place] [address]    Labs: CBC: Recent Labs  Lab 01/20/24 [phone] 01/21/24 0603 01/22/24 0609 01/23/24 0614 01/24/24 0725  WBC 7.2 8.7 6.5 4.9 4.7  NEUTROABS  --  7.6 4.9  3.5 3.3  HGB 14.0 11.8* 9.8* 9.3* 10.1*  HCT 41.9 36.0* 28.9* 28.0* 30.6*  MCV 92.9 96.0 94.4 94.3 95.3  PLT 186 169 168 173 203   Basic Metabolic Panel: Recent Labs  Lab  01/20/24 0627 01/21/24 0603 01/22/24 0609 01/23/24 0614 01/24/24 0725  NA 138 137 138 137 138  K 3.6 4.1 3.6 3.4* 4.4  CL 105 104 108 106 105  CO2 21* 21* 23 23 26   GLUCOSE 147* 196* 142* 145* 141*  BUN 16 26* 26* 21 18  CREATININE 0.93 0.91 0.75 0.88 0.82  CALCIUM  9.7 9.4 9.1 8.4* 9.1   Liver Function Tests: No results for input(s): "AST", "ALT", "ALKPHOS", "BILITOT", "PROT", "ALBUMIN" in the last 168 hours. CBG: Recent Labs  Lab 01/20/24 1937  GLUCAP 174*    Discharge time spent: greater than 30 minutes.  Author: Charlean Congress, MD  [facility]

## 2024-01-24 NOTE — Progress Notes (Signed)
 Orthopedic Tech Progress Note Patient Details:  Sean Lane 1928-04-19 [id number]  Ortho Devices Type of Ortho Device: Lumbar corsett Ortho Device/Splint Location: adjusted, at bedside Ortho Device/Splint Interventions: Ordered, Adjustment   Post Interventions Instructions Provided: Care of device, Adjustment of device  [name] 01/24/2024, 8:42 PM

## 2024-01-24 NOTE — Plan of Care (Signed)
  Problem: Education: Goal: Knowledge of General Education information will improve Description: Including pain rating scale, medication(s)/side effects and non-pharmacologic comfort measures Outcome: Progressing   Problem: Clinical Measurements: Goal: Ability to maintain clinical measurements within normal limits will improve Outcome: Progressing Goal: Will remain free from infection Outcome: Progressing   Problem: Nutrition: Goal: Adequate nutrition will be maintained Outcome: Progressing   Problem: Coping: Goal: Level of anxiety will decrease Outcome: Progressing   
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
## 2024-01-25 DIAGNOSIS — S72001S Fracture of unspecified part of neck of right femur, sequela: Secondary | ICD-10-CM | POA: Diagnosis not present

## 2024-01-25 LAB — URINALYSIS, ROUTINE W REFLEX MICROSCOPIC
Bilirubin Urine: NEGATIVE
Glucose, UA: NEGATIVE mg/dL
Ketones, ur: 20 mg/dL — AB
Nitrite: NEGATIVE
Protein, ur: NEGATIVE mg/dL
Specific Gravity, Urine: 1.016 (ref 1.005–1.030)
WBC, UA: >50 WBC/hpf (ref 0–5)
pH: 5 (ref 5.0–8.0)

## 2024-01-25 LAB — COMPREHENSIVE METABOLIC PANEL WITH GFR
ALT: 17 U/L (ref 0–44)
AST: 22 U/L (ref 15–41)
Albumin: 2.9 g/dL — ABNORMAL LOW (ref 3.5–5.0)
Alkaline Phosphatase: 111 U/L (ref 38–126)
Anion gap: 6 (ref 5–15)
BUN: 13 mg/dL (ref 8–23)
CO2: 27 mmol/L (ref 22–32)
Calcium: 9.1 mg/dL (ref 8.9–10.3)
Chloride: 103 mmol/L (ref 98–111)
Creatinine, Ser: 0.72 mg/dL (ref 0.61–1.24)
GFR, Estimated: >60 mL/min (ref >60)
Glucose, Bld: 157 mg/dL — ABNORMAL HIGH (ref 70–99)
Potassium: 3.6 mmol/L (ref 3.5–5.1)
Sodium: 136 mmol/L (ref 135–145)
Total Bilirubin: 1 mg/dL (ref 0.0–1.2)
Total Protein: 5.7 g/dL — ABNORMAL LOW (ref 6.5–8.1)

## 2024-01-25 LAB — CBC WITH DIFFERENTIAL/PLATELET
Abs Immature Granulocytes: 0.02 10*3/uL (ref 0.00–0.07)
Basophils Absolute: 0.1 10*3/uL (ref 0.0–0.1)
Basophils Relative: 1 %
Eosinophils Absolute: 0.1 10*3/uL (ref 0.0–0.5)
Eosinophils Relative: 2 %
HCT: 33 % — ABNORMAL LOW (ref 39.0–52.0)
Hemoglobin: 10.9 g/dL — ABNORMAL LOW (ref 13.0–17.0)
Immature Granulocytes: 0 %
Lymphocytes Relative: 15 %
Lymphs Abs: 0.8 10*3/uL (ref 0.7–4.0)
MCH: 31.1 pg (ref 26.0–34.0)
MCHC: 33 g/dL (ref 30.0–36.0)
MCV: 94 fL (ref 80.0–100.0)
Monocytes Absolute: 0.4 10*3/uL (ref 0.1–1.0)
Monocytes Relative: 7 %
Neutro Abs: 4 10*3/uL (ref 1.7–7.7)
Neutrophils Relative %: 75 %
Platelets: 232 10*3/uL (ref 150–400)
RBC: 3.51 MIL/uL — ABNORMAL LOW (ref 4.22–5.81)
RDW: 14.7 % (ref 11.5–15.5)
WBC: 5.4 10*3/uL (ref 4.0–10.5)
nRBC: 0 % (ref 0.0–0.2)

## 2024-01-25 LAB — GLUCOSE, CAPILLARY: Glucose-Capillary: 162 mg/dL — ABNORMAL HIGH (ref 70–99)

## 2024-01-25 MED ORDER — AMLODIPINE BESYLATE 2.5 MG PO TABS
2.5000 mg | ORAL_TABLET | Freq: Every day | ORAL | Status: DC
  Administered 2024-01-26 – 2024-02-02 (×8): 2.5 mg via ORAL
  Filled 2024-01-25 (×8): qty 1

## 2024-01-25 MED ORDER — LISINOPRIL 5 MG PO TABS
5.0000 mg | ORAL_TABLET | Freq: Every day | ORAL | Status: DC
  Administered 2024-01-26 – 2024-02-05 (×9): 5 mg via ORAL
  Filled 2024-01-25 (×11): qty 1

## 2024-01-25 MED ORDER — LIDOCAINE HCL URETHRAL/MUCOSAL 2 % EX GEL: CUTANEOUS | Status: DC | PRN

## 2024-01-25 NOTE — Progress Notes (Signed)
 PROGRESS NOTE   Subjective/Complaints:  Pt c/o urinary frequency and urgency- per wife, q30 minutes sometimes.   However also hasn't been able to have BM- has been 5 days is he's "miserable"- feels like has stool ball.   Wife doesn't want scheduled tylenol  just prn  Also was very confused last night- thought he was at home and didn't remember having surgery.   Hasn't received Norco since admission last evening- taking tylenol .    ROS: Negative except for HPI  Pt denies SOB, abd pain, CP, N/V/C/D, and vision changes   Objective:   No results found. Recent Labs    01/24/24 0725 01/25/24 0659  WBC 4.7 5.4  HGB 10.1* 10.9*  HCT 30.6* 33.0*  PLT 203 232   Recent Labs    01/23/24 0614 01/24/24 0725  NA 137 138  K 3.4* 4.4  CL 106 105  CO2 23 26  GLUCOSE 145* 141*  BUN 21 18  CREATININE 0.88 0.82  CALCIUM  8.4* 9.1    Intake/Output Summary (Last 24 hours) at 01/25/2024 0819 Last data filed at 01/25/2024 0450 Gross per 24 hour  Intake 240 ml  Output 50 ml  Net 190 ml        Physical Exam: Vital Signs Blood pressure (!) 161/78, pulse 83, temperature 98.2 F (36.8 C), temperature source Oral, resp. rate 18, height 6\' 2"  (1.88 m), weight 79.3 kg, SpO2 98%.   General: awake, alert, appropriate,  supine in bed; wife at bedside; on bedpan- no results; NAD HENT: conjugate gaze; oropharynx dry CV: regular rate and irregular rhythm; no JVD Pulmonary: CTA B/L; no W/R/R- good air movement GI: soft, but distended- hypoactive BS Psychiatric: appropriate Neurological: Ox3 so far this AM- but wife noted was very confused last night    Cervical back: Neck supple. No tenderness.     Comments: Surgical dressing on right anterior hip. RLE limited by pain.    Ue's 5-/5 LLE 5-/5 throughout LLE- HF 2/5- limited by pain- KE- "Cannot do" due to pain; DF/PF 5-/5 EHL 4+/5  Skin:    General: Skin is warm and dry.      Comments: Anterior R hip surgical dressing moderate swelling, no surrounding erythema or drainage noted  Neurological:     Mental Status: He is alert and oriented to person, place, and time.     Comments: Intact to light touch in all 4 extremities  Assessment/Plan: 1. Functional deficits which require 3+ hours per day of interdisciplinary therapy in a comprehensive inpatient rehab setting. Physiatrist is providing close team supervision and 24 hour management of active medical problems listed below. Physiatrist and rehab team continue to assess barriers to discharge/monitor patient progress toward functional and medical goals  Care Tool:  Bathing              Bathing assist       Upper Body Dressing/Undressing Upper body dressing        Upper body assist      Lower Body Dressing/Undressing Lower body dressing            Lower body assist       Toileting Toileting    Toileting assist  Transfers Chair/bed transfer  Transfers assist           Locomotion Ambulation   Ambulation assist              Walk 10 feet activity   Assist           Walk 50 feet activity   Assist           Walk 150 feet activity   Assist           Walk 10 feet on uneven surface  activity   Assist           Wheelchair     Assist               Wheelchair 50 feet with 2 turns activity    Assist            Wheelchair 150 feet activity     Assist          Blood pressure (!) 161/78, pulse 83, temperature 98.2 F (36.8 C), temperature source Oral, resp. rate 18, height 6\' 2"  (1.88 m), weight 79.3 kg, SpO2 98%.  Medical Problem List and Plan: 1. Functional deficits secondary to R femoral neck fx secondary to fall with R hemiarthroplasty             -patient may  shower- cover surgical incision             -ELOS/Goals: 7-10 days supervision             Admit to CIR  First day of evaluations- con't CIR PT and  OT 2.  Antithrombotics: -DVT/anticoagulation:  Pharmaceutical: Xarelto              -antiplatelet therapy: N/A 3.Recent L4 endplate Fx/ Pain Management after hip fx: On Cymbalta . Continue  hydrocodone prn.    5/16- not taking Norco right now- and still confused last night- wife has been refusing Tylenol  500 mg scheduled just taking 650 mg prn             --LSO ordered for support 4. Mood/Behavior/Sleep: LCSW to follow for evaluation and support.              -antipsychotic agents: N/A 5. Neuropsych/cognition: This patient is capable of making decisions on his own behalf. 6. Skin/Wound Care: Routine pressure relief measures.  7. Fluids/Electrolytes/Nutrition: Monitor I/O. Check CMET in am             --encourage intake  8. Pre-renal azotemia: BUN/SCr 18/0.85 at admission-->26/0.91-->18/0.82  5/16- BUN 13 and Cr 0.72- doing better             --encourage fluid intake 9. Acute blood loss anemia: Monitor for signs of bleeding. Recheck CBC in am             --Hgb down from 14.0-->10.1 likely due to post op loss.   5/16- Hb up to 10.9 today 10. Recurrent hypokalemia: Improved after supplement. Recheck in am.  11. Constipation: Sorbitol today. Increase Senna S to 2 tabs bid.    5/16- no results- ordered soap suds enema to occur as soon as possible and not interfere with therapy.  12. Diet controlled DM: Continue CM/make heart healthy diet.  13. CAD/SSS/A fib: Monitor HR TID. On Xarelto . Heart healthy restrictions added per requrest. 14. HTN: Monitor BP TID-on lisinopril  and amlodipine .   5/16- BP elevated to 160s this AM- but normally 130s-140s- con't to monitor trend 15. Hx BPH: Toilet every 4 hours while awake.  16. Confusion overnight and urinary frequency/urgency  5/16- will check U/A and Cx    I spent a total of  39  minutes on total care today- >50% coordination of care- due to  D/w nursing and PA about needing U/A and Cx and soap suds enema- prolonged time with pt/family discussing  plan      LOS: 1 days A FACE TO FACE EVALUATION WAS PERFORMED  Sean Lane 01/25/2024, 8:19 AM

## 2024-01-25 NOTE — Progress Notes (Signed)
 Inpatient Rehabilitation Admission Medication Review by a Pharmacist  A complete drug regimen review was completed for this patient to identify any potential clinically significant medication issues.  High Risk Drug Classes Is patient taking? Indication by Medication  Antipsychotic Yes Compazine - prn N/V  Anticoagulant Yes Xarelto  - VTE ppx  Antibiotic No   Opioid Yes Vicodin - prn pain  Antiplatelet No   Hypoglycemics/insulin  No   Vasoactive Medication Yes Amlodipine , lisinopril  - BP  Chemotherapy No   Other Yes Rosuvastatin  - HLD Duloxetine  - Mood, pain Methocarbamol  - prn spasms     Type of Medication Issue Identified Description of Issue Recommendation(s)  Drug Interaction(s) (clinically significant)     Duplicate Therapy     Allergy     No Medication Administration End Date     Incorrect Dose     Additional Drug Therapy Needed     Significant med changes from prior encounter (inform family/care partners about these prior to discharge).    Other       Clinically significant medication issues were identified that warrant physician communication and completion of prescribed/recommended actions by midnight of the next day:  No  Name of provider notified for urgent issues identified:   Provider Method of Notification:     Pharmacist comments: None  Time spent performing this drug regimen review (minutes): 20 minutes  Thank you Lennice Quivers, PharmD
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
## 2024-01-25 NOTE — IPOC Note (Signed)
 Overall Plan of Care [facility]) Patient Details Name: Sean Lane MRN: [id number] DOB: 1928-03-25  Admitting Diagnosis: Fracture, proximal femur, right, sequela  Hospital Problems: Principal Problem:   Fracture, proximal femur, right, sequela     Functional Problem List: Nursing Edema, Endurance, Medication Management, Pain, Safety, Skin Integrity  PT Balance, Skin Integrity, Pain, Endurance, Motor, Sensory, Edema  OT Balance, Pain, Safety, Edema, Endurance, Motor, Skin Integrity  SLP    TR         Basic ADL's: OT Bathing, Dressing, Toileting     Advanced  ADL's: OT       Transfers: PT Bed Mobility, Bed to Chair, Customer service manager, Tub/Shower     Locomotion: PT Ambulation, Psychologist, prison and probation services, Stairs     Additional Impairments: OT None  SLP        TR      Anticipated Outcomes Item Anticipated Outcome  Self Feeding n/a  Swallowing      Basic self-care  supervision/ mod I  Toileting  supervision   Bathroom Transfers supervision  Bowel/Bladder  continent of bowel and bladder  Transfers  mod i with LRAD  Locomotion  supervision with LRAD  Communication     Cognition     Pain  <4 w/ prnsj  Safety/Judgment  Manage safety with supervision assistance   Therapy Plan: PT Intensity: Minimum of 1-2 x/day ,45 to 90 minutes PT Frequency: 5 out of 7 days PT Duration Estimated Length of Stay: 7-10 days OT Intensity: Minimum of 1-2 x/day, 45 to 90 minutes OT Frequency: 5 out of 7 days OT Duration/Estimated Length of Stay: ~ 10 days     Team Interventions: Nursing Interventions Patient/Family Education, Medication Management, Disease Management/Prevention, Pain Management, Discharge Planning, Skin Care/Wound Management  PT interventions Ambulation/gait training, Cognitive remediation/compensation, Discharge planning, DME/adaptive equipment instruction, Functional mobility training, Pain management, Psychosocial support, Splinting/orthotics, Therapeutic  Activities, UE/LE Strength taining/ROM, Visual/perceptual remediation/compensation, Wheelchair propulsion/positioning, UE/LE Coordination activities, Therapeutic Exercise, Stair training, Skin care/wound management, Patient/family education, Neuromuscular re-education, Functional electrical stimulation, Disease management/prevention, Firefighter, Warden/ranger  OT Interventions Warden/ranger, Discharge planning, Pain management, Self Care/advanced ADL retraining, Therapeutic Activities, UE/LE Coordination activities, Disease mangement/prevention, Functional mobility training, Patient/family education, Skin care/wound managment, Therapeutic Exercise, Community reintegration, Fish farm manager, Neuromuscular re-education, Psychosocial support, UE/LE Strength taining/ROM  SLP Interventions    TR Interventions    SW/CM Interventions Discharge Planning, Psychosocial Support, Patient/Family Education   Barriers to Discharge MD  Medical stability, Home enviroment access/loayout, Incontinence, Wound care, Lack of/limited family support, and Weight bearing restrictions  Nursing Decreased caregiver support, Home environment access/layout, Weight bearing restrictions Discharge: House  Discharge Home Layout: Two level, 1/2 bath on main level  Alternate Level Stairs-Rails: Right  Alternate Level Stairs-Number of Steps: 13  Discharge Home Access: Stairs to enter  Entrance Stairs-Rails: Can reach both  Entrance Stairs-Number of Steps: 3  PT Inaccessible home environment, Home environment access/layout, Wound Care, Weight bearing restrictions    OT      SLP      SW Decreased caregiver support, Lack of/limited family support     Team Discharge Planning: Destination: PT-Home ,OT- Home , SLP-  Projected Follow-up: PT-Home health PT, OT-  Home health OT, SLP-  Projected Equipment Needs: PT-To be determined, OT- To be determined, SLP-  Equipment Details: PT-  , OT-  Patient/family involved in discharge planning: PT- Patient, Family member/caregiver,  OT-Patient, Family member/caregiver, SLP-   MD ELOS: ~10 days Medical Rehab Prognosis:  Good Assessment:  The patient has been admitted for CIR therapies with the diagnosis of  R hip fx s/p hemiarthroplasty. The team will be addressing functional mobility, strength, stamina, balance, safety, adaptive techniques and equipment, self-care, bowel and bladder mgt, patient and caregiver education, . Goals have been set at [facility] to mod I. Anticipated discharge destination is home with wife.        See Team Conference Notes for weekly updates to the plan of care

## 2024-01-25 NOTE — Progress Notes (Signed)
 Inpatient Rehabilitation  Patient information reviewed and entered into eRehab system by [facility]. Karen Kays., CCC/SLP, PPS Coordinator.  Information including medical coding, functional ability and quality indicators will be reviewed and updated through discharge.

## 2024-01-25 NOTE — Plan of Care (Signed)
  Problem: RH Balance Goal: LTG: Patient will maintain dynamic sitting balance (OT) Description: LTG:  Patient will maintain dynamic sitting balance with assistance during activities of daily living (OT) Flowsheets (Taken 01/25/2024 1516) LTG: Pt will maintain dynamic sitting balance during ADLs with: Independent Goal: LTG Patient will maintain dynamic standing with ADLs (OT) Description: LTG:  Patient will maintain dynamic standing balance with assist during activities of daily living (OT)  Flowsheets (Taken 01/25/2024 1516) LTG: Pt will maintain dynamic standing balance during ADLs with: Supervision/Verbal cueing   Problem: Sit to Stand Goal: LTG:  Patient will perform sit to stand in prep for activites of daily living with assistance level (OT) Description: LTG:  Patient will perform sit to stand in prep for activites of daily living with assistance level (OT) Flowsheets (Taken 01/25/2024 1516) LTG: PT will perform sit to stand in prep for activites of daily living with assistance level: Independent with assistive device   Problem: RH Bathing Goal: LTG Patient will bathe all body parts with assist levels (OT) Description: LTG: Patient will bathe all body parts with assist levels (OT) Flowsheets (Taken 01/25/2024 1516) LTG: Pt will perform bathing with assistance level/cueing: Set up assist    Problem: RH Dressing Goal: LTG Patient will perform upper body dressing (OT) Description: LTG Patient will perform upper body dressing with assist, with/without cues (OT). Flowsheets (Taken 01/25/2024 1516) LTG: Pt will perform upper body dressing with assistance level of:  Independent  Including orthosis Goal: LTG Patient will perform lower body dressing w/assist (OT) Description: LTG: Patient will perform lower body dressing with assist, with/without cues in positioning using equipment (OT) Flowsheets (Taken 01/25/2024 1516) LTG: Pt will perform lower body dressing with assistance level of:  Independent with assistive device   Problem: RH Toileting Goal: LTG Patient will perform toileting task (3/3 steps) with assistance level (OT) Description: LTG: Patient will perform toileting task (3/3 steps) with assistance level (OT)  Flowsheets (Taken 01/25/2024 1516) LTG: Pt will perform toileting task (3/3 steps) with assistance level: Independent with assistive device   Problem: RH Simple Meal Prep Goal: LTG Patient will perform simple meal prep w/assist (OT) Description: LTG: Patient will perform simple meal prep with assistance, with/without cues (OT). Flowsheets (Taken 01/25/2024 1516) LTG: Pt will perform simple meal prep with assistance level of: Supervision/Verbal cueing   Problem: RH Toilet Transfers Goal: LTG Patient will perform toilet transfers w/assist (OT) Description: LTG: Patient will perform toilet transfers with assist, with/without cues using equipment (OT) Flowsheets (Taken 01/25/2024 1516) LTG: Pt will perform toilet transfers with assistance level of: Independent with assistive device   Problem: RH Tub/Shower Transfers Goal: LTG Patient will perform tub/shower transfers w/assist (OT) Description: LTG: Patient will perform tub/shower transfers with assist, with/without cues using equipment (OT) Flowsheets (Taken 01/25/2024 1516) LTG: Pt will perform tub/shower stall transfers with assistance level of: Supervision/Verbal cueing

## 2024-01-25 NOTE — Progress Notes (Signed)
 Physical Therapy Session Note  Patient Details  Name: Sean Lane MRN: [id number] Date of Birth: May 09, 1928  Today's Date: 01/25/2024 PT Individual Time: 1350-1500 PT Individual Time Calculation (min): 70 min   Short Term Goals: Week 1:  PT Short Term Goal 1 (Week 1): =LTGs d/t ELOS  Skilled Therapeutic Interventions/Progress Updates:  pt received in bed and agreeable to therapy. Pt reports 8/10 back pain and 2/10 hip pain while sitting EOB, premedicated. Rest and positioning provided as needed. Pt participated in BIL heel slides to "warm up" and prepare body for movement. Assisted pt with donning pants including mod a to roll L and min a to roll R. Mod a supine<>sit during session with log roll technique. Increased time throughout session d/t pain and mild intermittent dizziness. BP EOB=146/62 (82). Sit to stand and Stand pivot transfer with min a throughout session. Pt transported to therapy gym for time management and energy conservation. Pt performed stair navigation with L ascending and R descending with both hand rails and min a x 4. Pt then ambulated x 21 ft before reporting dizziness which took several minutes to resolve in sitting. BP=96/75(83). Pt does report frequent breath holding, but does well with cues to breathe. Pt returned to room and requested to use bathroom. ambulatory transfer with min a and CGA for 3/3 toileting tasks, with assist for thoroughness. Pt returned to bed and was left with all needs in reach and alarm active.   Therapy Documentation Precautions:  Precautions Precautions: Fall Required Braces or Orthoses: Spinal Brace Spinal Brace: Lumbar corset (OOB) Restrictions Weight Bearing Restrictions Per Provider Order: No RLE Weight Bearing Per Provider Order: Weight bearing as tolerated General:       Therapy/Group: Individual Therapy  Tex Filbert 01/25/2024, 3:51 PM
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
## 2024-01-26 ENCOUNTER — Other Ambulatory Visit: Payer: Self-pay | Admitting: Family Medicine

## 2024-01-26 DIAGNOSIS — N3000 Acute cystitis without hematuria: Secondary | ICD-10-CM

## 2024-01-26 DIAGNOSIS — E119 Type 2 diabetes mellitus without complications: Secondary | ICD-10-CM

## 2024-01-26 DIAGNOSIS — I10 Essential (primary) hypertension: Secondary | ICD-10-CM

## 2024-01-26 LAB — GLUCOSE, CAPILLARY
Glucose-Capillary: 152 mg/dL — ABNORMAL HIGH (ref 70–99)
Glucose-Capillary: 149 mg/dL — ABNORMAL HIGH (ref 70–99)
Glucose-Capillary: 166 mg/dL — ABNORMAL HIGH (ref 70–99)

## 2024-01-26 MED ORDER — CEPHALEXIN 250 MG PO CAPS
250.0000 mg | ORAL_CAPSULE | Freq: Four times a day (QID) | ORAL | Status: DC
  Administered 2024-01-26 – 2024-01-27 (×5): 250 mg via ORAL
  Filled 2024-01-26 (×5): qty 1

## 2024-01-26 NOTE — Progress Notes (Signed)
 Physical Therapy Session Note  Patient Details  Name: Sean Lane MRN: [id number] Date of Birth: 08-Mar-1928  Today's Date: 01/26/2024 PT Individual Time: 0803-0912 PT Individual Time Calculation (min): 69 min   Short Term Goals: Week 1:  PT Short Term Goal 1 (Week 1): =LTGs d/t ELOS  Skilled Therapeutic Interventions/Progress Updates:  Patient supine in bed on entrance to room. Patient alert and agreeable to PT session. Wife present and relates recent hx as well as how pt has been affected by constipation and then consequences of sorbitol  admin yesterday. Pt looks and feels better this day but still weak.   Patient with 2-3/10 pain complaint at start of session with focus of pain at surgical site.   Pt willing to participate in mobility but does relate that pain increases with time upright. Pt also tends to experience dizziness and balance loss when upright.   Therapeutic Activity: Bed Mobility: Pt requires MaxA to thread RLE into pant leg and then MinA for LLE. Then is able to pull up to hips. Unable to perform bridge d/t pain in RLE. MaxA to don pants. Rolls to L side with MaxA for RLE. Able to push up from bed/ bedrail to upright seated position with ModA. While in seated position, back brace donned with Mod/ MaxA while providing instructions on steps of donning. Pt relates improved pain in low back with tightening of LSO.   BP taken after sitting EOB for several minutes. Reading at 139/101(113) with pulse 88.   While seated, height of bed progressed slowly to increased height to provide pt time in slowly increasing WB as well as more upright posture for BP to attempt to adjust.   Transfers: Sit<>stand transfer performed once bed elevated to height of modified stance. Pt able to rise to stand and maintains for >60min. Pain increases in R hip/ thigh and then pt starts to feel dizzy/ light headed. Moves to sit. BP reading started in standing and completes once seated. Reading 117/ 96  (101), pulse 85.   Once pt returned to supine with sidelying technique requiring MaxA to bring BLE onto bed surface, TED hose donned with Tot/ MaxA to don.   Throughout session, pt positively distracted from pain with conversation.   Patient supine in bed at end of session with brakes locked, bed alarm set, and all needs within reach. Pt to rest in modified chair position in bed prior to next session in 1 hr.    Therapy Documentation Precautions:  Precautions Precautions: Fall Required Braces or Orthoses: Spinal Brace Spinal Brace: Lumbar corset (OOB) Restrictions Weight Bearing Restrictions Per Provider Order: Yes RLE Weight Bearing Per Provider Order: Weight bearing as tolerated  Pain:  Pain increases with move into weight bearing. Once standing, pain increases with time and also experiences increased dizziness. Pt medicated during session.    Therapy/Group: Individual Therapy  Donne Gage PT, DPT, CSRS 01/26/2024, 8:12 AM

## 2024-01-26 NOTE — Progress Notes (Signed)
 Patient missed pm OT treatment due to LBP and fatigue. Patient is motivated to continue making gains and working towards safe discharge home.

## 2024-01-26 NOTE — Progress Notes (Signed)
 Physical Therapy Session Note  Patient Details  Name: Sean Lane MRN: [id number] Date of Birth: [birth year]  Today's Date: 01/26/2024 PT Individual Time: 1009-1053 PT Individual Time Calculation (min): 44 min   Short Term Goals: Week 1:  PT Short Term Goal 1 (Week 1): =LTGs d/t ELOS  Skilled Therapeutic Interventions/Progress Updates:    Patient in supine and assisted to reposition hips per his request.  Performed supine therex consisting of AP's, HS, hip adductor squeezes, SAQ, hip abd/add all x 10 cues for breathing and technique.  In bed dix hall pike using bed features negative bilateral for nystagmus or symptoms.  Patient rolled to L with min A with rail and side to sit with A for trunk upright.  Patient on EOB then standing for orthostatics as noted, Orthostatic VS for the past 24 hrs (Last 3 readings):  BP- Sitting BP- Standing at 0 minutes  01/26/24 0451 124/51 (!) 81/51   Patent symptomatic despite wearing back brace and TED's.   Patient min A for step pivot to wheelchair.  Assisted in wheelchair to therapy gym for energy conservation.  Patient again step pivot to Nu Step for UE/LE x 4 min with both legs then 2 more minutes R leg on step for rest.  BP check after activity 106/51, SpO2 98%, HR 91.  Assisted to room and left in wheelchair with wife in the room and needs in reach.  Therapy Documentation Precautions:  Precautions Precautions: Fall Required Braces or Orthoses: Spinal Brace Spinal Brace: Lumbar corset (OOB) Restrictions Weight Bearing Restrictions Per Provider Order: Yes RLE Weight Bearing Per Provider Order: Weight bearing as tolerated  Pain: Pain Assessment Pain Scale: 0-10 Pain Score: 6  Pain Type: Acute pain Pain Location: Hip Pain Orientation: Right Pain Descriptors / Indicators: Aching Pain Onset: With Activity Pain Intervention(s): Repositioned;Ambulation/increased activity   Therapy/Group: Individual Therapy  Marley Simmers 01/26/2024, 10:45  AM  Abigail Hoff, PT

## 2024-01-26 NOTE — Progress Notes (Addendum)
 PROGRESS NOTE   Subjective/Complaints:  Reports feels better than yesterday.  Had a bad night due to multiple bowel movements after sorbitol .   ROS: Negative except for HPI  Pt denies SOB, abd pain, CP, N/V/C, and vision changes + diarrhea last night after sorbitol    Objective:   No results found. Recent Labs    01/24/24 0725 01/25/24 0659  WBC 4.7 5.4  HGB 10.1* 10.9*  HCT 30.6* 33.0*  PLT 203 232   Recent Labs    01/24/24 0725 01/25/24 0659  NA 138 136  K 4.4 3.6  CL 105 103  CO2 26 27  GLUCOSE 141* 157*  BUN 18 13  CREATININE 0.82 0.72  CALCIUM  9.1 9.1    Intake/Output Summary (Last 24 hours) at 01/26/2024 1104 Last data filed at 01/26/2024 0733 Gross per 24 hour  Intake 236 ml  Output 250 ml  Net -14 ml        Physical Exam: Vital Signs Blood pressure (!) 158/74, pulse 69, temperature 98.2 F (36.8 C), resp. rate 17, height 6\' 2"  (1.88 m), weight 79.3 kg, SpO2 97%.   General: awake, alert, appropriate,  supine in bed; wife at bedside; on bedpan- no results; NAD HENT: conjugate gaze; oropharynx dry CV: regular rate and irregular rhythm; no JVD Pulmonary: CTA B/L; no W/R/R- good air movement GI: soft, but distended- hypoactive BS Psychiatric: appropriate Neurological: Ox3 so far this AM- but wife noted was very confused last night    Cervical back: Neck supple. No tenderness.     Comments: Surgical dressing on right anterior hip. RLE limited by pain.    Ue's 5-/5 LLE 5-/5 throughout LLE- HF 2/5- limited by pain- KE- "Cannot do" due to pain; DF/PF 5-/5 EHL 4+/5  Skin:    General: Skin is warm and dry.     Comments: Anterior R hip surgical dressing moderate swelling, no surrounding erythema or drainage noted  Neurological:     Mental Status: He is alert and oriented to person, place, and time.     Comments: Intact to light touch in all 4 extremities  Assessment/Plan: 1. Functional  deficits which require 3+ hours per day of interdisciplinary therapy in a comprehensive inpatient rehab setting. Physiatrist is providing close team supervision and 24 hour management of active medical problems listed below. Physiatrist and rehab team continue to assess barriers to discharge/monitor patient progress toward functional and medical goals  Care Tool:  Bathing    Body parts bathed by patient: Right arm, Left arm, Chest, Abdomen, Front perineal area, Face   Body parts bathed by helper: Buttocks, Right upper leg, Left upper leg Body parts n/a: Right lower leg, Left lower leg   Bathing assist Assist Level: Minimal Assistance - Patient > 75%     Upper Body Dressing/Undressing Upper body dressing   What is the patient wearing?: Pull over shirt    Upper body assist Assist Level: Moderate Assistance - Patient 50 - 74%    Lower Body Dressing/Undressing Lower body dressing      What is the patient wearing?: Pants     Lower body assist Assist for lower body dressing: Total Assistance - Patient < 25%  Toileting Toileting    Toileting assist Assist for toileting: Total Assistance - Patient < 25%     Transfers Chair/bed transfer  Transfers assist     Chair/bed transfer assist level: Moderate Assistance - Patient 50 - 74%     Locomotion Ambulation   Ambulation assist   Ambulation activity did not occur: Safety/medical concerns          Walk 10 feet activity   Assist  Walk 10 feet activity did not occur: Safety/medical concerns        Walk 50 feet activity   Assist Walk 50 feet with 2 turns activity did not occur: Safety/medical concerns         Walk 150 feet activity   Assist Walk 150 feet activity did not occur: Safety/medical concerns         Walk 10 feet on uneven surface  activity   Assist Walk 10 feet on uneven surfaces activity did not occur: Safety/medical concerns         Wheelchair     Assist Is the patient  using a wheelchair?: Yes Type of Wheelchair: Manual Wheelchair activity did not occur: Safety/medical concerns         Wheelchair 50 feet with 2 turns activity    Assist    Wheelchair 50 feet with 2 turns activity did not occur: Safety/medical concerns       Wheelchair 150 feet activity     Assist  Wheelchair 150 feet activity did not occur: Safety/medical concerns       Blood pressure (!) 158/74, pulse 69, temperature 98.2 F (36.8 C), resp. rate 17, height 6\' 2"  (1.88 m), weight 79.3 kg, SpO2 97%.  Medical Problem List and Plan: 1. Functional deficits secondary to R femoral neck fx secondary to fall with R hemiarthroplasty             -patient may  shower- cover surgical incision             -ELOS/Goals: 7-10 days supervision             Continue CIR PT and OT 2.  Antithrombotics: -DVT/anticoagulation:  Pharmaceutical: Xarelto              -antiplatelet therapy: N/A 3.Recent L4 endplate Fx/ Pain Management after hip fx: On Cymbalta . Continue  hydrocodone  prn.    5/16- not taking Norco right now- and still confused last night- wife has been refusing Tylenol  500 mg scheduled just taking 650 mg prn             --LSO ordered for support 4. Mood/Behavior/Sleep: LCSW to follow for evaluation and support.              -antipsychotic agents: N/A 5. Neuropsych/cognition: This patient is capable of making decisions on his own behalf. 6. Skin/Wound Care: Routine pressure relief measures.  7. Fluids/Electrolytes/Nutrition: Monitor I/O. Check CMET in am             --encourage intake  8. Pre-renal azotemia: BUN/SCr 18/0.85 at admission-->26/0.91-->18/0.82  5/16- BUN 13 and Cr 0.72- doing better             --encourage fluid intake 9. Acute blood loss anemia: Monitor for signs of bleeding. Recheck CBC in am             --Hgb down from 14.0-->10.1 likely due to post op loss.   5/16- Hb up to 10.9 today 10. Recurrent hypokalemia: Improved after supplement. Recheck in am.   11. Constipation: Sorbitol   today. Increase Senna S to 2 tabs bid.    5/16- no results- ordered soap suds enema to occur as soon as possible and not interfere with therapy.   5/17 multiple BM last night, continue to monitor to prevent recurrence of constipation 12. Diet controlled DM: Continue CM/make heart healthy diet.  13. CAD/SSS/A fib: Monitor HR TID. On Xarelto . Heart healthy restrictions added per requrest. 14. HTN/Orthostatic hypotension: Monitor BP TID-on lisinopril  and amlodipine .   5/16- BP elevated to 160s this AM- but normally 130s-140s- con't to monitor trend  5/17 BP intermittently elevated although has had some issues with orthostatic hypotension., abd binder/TEDS for [facility]      01/26/2024    4:51 AM 01/25/2024    8:38 PM 01/25/2024    4:11 AM  Vitals with BMI  Systolic 158 138 253  Diastolic 74 68 78  Pulse 69 93 83    15. Hx BPH: Toilet every 4 hours while awake.  16. Confusion overnight and urinary frequency/urgency  5/16- will check U/A and Cx  UTI treatment started 17. UTI  -5/17 Start keflex , monitor cultures      LOS: 2 days A FACE TO FACE EVALUATION WAS PERFORMED  Lylia Sand 01/26/2024, 11:04 AM

## 2024-01-27 DIAGNOSIS — Z794 Long term (current) use of insulin: Secondary | ICD-10-CM

## 2024-01-27 DIAGNOSIS — M79604 Pain in right leg: Secondary | ICD-10-CM

## 2024-01-27 LAB — GLUCOSE, CAPILLARY
Glucose-Capillary: 149 mg/dL — ABNORMAL HIGH (ref 70–99)
Glucose-Capillary: 193 mg/dL — ABNORMAL HIGH (ref 70–99)
Glucose-Capillary: 148 mg/dL — ABNORMAL HIGH (ref 70–99)

## 2024-01-27 MED ORDER — CEPHALEXIN 250 MG PO CAPS
250.0000 mg | ORAL_CAPSULE | Freq: Four times a day (QID) | ORAL | Status: AC
  Administered 2024-01-27 – 2024-02-02 (×24): 250 mg via ORAL
  Filled 2024-01-27 (×25): qty 1

## 2024-01-27 MED ORDER — HYDROCODONE-ACETAMINOPHEN 7.5-325 MG PO TABS
1.0000 | ORAL_TABLET | ORAL | Status: DC | PRN
  Administered 2024-01-27 – 2024-02-04 (×13): 1 via ORAL
  Filled 2024-01-27 (×13): qty 1

## 2024-01-27 NOTE — Progress Notes (Addendum)
 PROGRESS NOTE   Subjective/Complaints:  Patient had a much better night last night, was not kept up by multiple bowel movements.  Reports continued pain in his right thigh.  Reports hydrocodone  helps and last for about 3 to 3-1/2 hours, usually getting 5 mg dose.   ROS: Negative except for HPI  Pt denies SOB, abd pain, CP, N/V/C, and vision changes + diarrhea after sorbitol  improved  Objective:   No results found. Recent Labs    01/25/24 0659  WBC 5.4  HGB 10.9*  HCT 33.0*  PLT 232   Recent Labs    01/25/24 0659  NA 136  K 3.6  CL 103  CO2 27  GLUCOSE 157*  BUN 13  CREATININE 0.72  CALCIUM  9.1    Intake/Output Summary (Last 24 hours) at 01/27/2024 1220 Last data filed at 01/27/2024 1610 Gross per 24 hour  Intake 237 ml  Output 600 ml  Net -363 ml        Physical Exam: Vital Signs Blood pressure 125/70, pulse 72, temperature 98.4 F (36.9 C), temperature source Oral, resp. rate 18, height 6\' 2"  (1.88 m), weight 79.3 kg, SpO2 97%.   General: awake, alert, appropriate, sitting in chair, wife at bedside on bedpan- no results; NAD HENT: conjugate gaze; oropharynx dry CV: regular rate and irregular rhythm; no JVD Pulmonary: CTA B/L; no W/R/R- good air movement GI: soft, but distended- hypoactive BS Psychiatric: appropriate Neurological: Ox3 so far this AM    Cervical back: Neck supple. No tenderness.     Comments: Surgical dressing on right anterior hip. RLE limited by pain.    Ue's 5-/5 LLE 5-/5 throughout LLE- HF 2/5- limited by pain- KE- "Cannot do" due to pain; DF/PF 5-/5 EHL 4+/5  Skin:    General: Skin is warm and dry.     Comments: Anterior R hip surgical dressing moderate swelling, no surrounding erythema or drainage noted  Neurological:     Mental Status: He is alert and oriented to person, place, and time.     Comments: Intact to light touch in all 4 extremities  Assessment/Plan: 1.  Functional deficits which require 3+ hours per day of interdisciplinary therapy in a comprehensive inpatient rehab setting. Physiatrist is providing close team supervision and 24 hour management of active medical problems listed below. Physiatrist and rehab team continue to assess barriers to discharge/monitor patient progress toward functional and medical goals  Care Tool:  Bathing    Body parts bathed by patient: Right arm, Left arm, Chest, Abdomen, Front perineal area, Face   Body parts bathed by helper: Buttocks, Right upper leg, Left upper leg Body parts n/a: Right lower leg, Left lower leg   Bathing assist Assist Level: Minimal Assistance - Patient > 75%     Upper Body Dressing/Undressing Upper body dressing   What is the patient wearing?: Pull over shirt    Upper body assist Assist Level: Moderate Assistance - Patient 50 - 74%    Lower Body Dressing/Undressing Lower body dressing      What is the patient wearing?: Pants     Lower body assist Assist for lower body dressing: Total Assistance - Patient < 25%  Toileting Toileting    Toileting assist Assist for toileting: Total Assistance - Patient < 25%     Transfers Chair/bed transfer  Transfers assist     Chair/bed transfer assist level: Moderate Assistance - Patient 50 - 74%     Locomotion Ambulation   Ambulation assist   Ambulation activity did not occur: Safety/medical concerns          Walk 10 feet activity   Assist  Walk 10 feet activity did not occur: Safety/medical concerns        Walk 50 feet activity   Assist Walk 50 feet with 2 turns activity did not occur: Safety/medical concerns         Walk 150 feet activity   Assist Walk 150 feet activity did not occur: Safety/medical concerns         Walk 10 feet on uneven surface  activity   Assist Walk 10 feet on uneven surfaces activity did not occur: Safety/medical concerns         Wheelchair     Assist Is  the patient using a wheelchair?: Yes Type of Wheelchair: Manual Wheelchair activity did not occur: Safety/medical concerns         Wheelchair 50 feet with 2 turns activity    Assist    Wheelchair 50 feet with 2 turns activity did not occur: Safety/medical concerns   Assist Level: Dependent - Patient 0%   Wheelchair 150 feet activity     Assist  Wheelchair 150 feet activity did not occur: Safety/medical concerns       Blood pressure 125/70, pulse 72, temperature 98.4 F (36.9 C), temperature source Oral, resp. rate 18, height 6\' 2"  (1.88 m), weight 79.3 kg, SpO2 97%.  Medical Problem List and Plan: 1. Functional deficits secondary to R femoral neck fx secondary to fall with R hemiarthroplasty             -patient may  shower- cover surgical incision             -ELOS/Goals: 7-10 days supervision             Continue CIR PT and OT 2.  Antithrombotics: -DVT/anticoagulation:  Pharmaceutical: Xarelto              -antiplatelet therapy: N/A 3.Recent L4 endplate Fx/ Pain Management after hip fx: On Cymbalta . Continue  hydrocodone  prn.    5/16- not taking Norco right now- and still confused last night- wife has been refusing Tylenol  500 mg scheduled just taking 650 mg prn             --LSO ordered for support  5/17 hydrocodone  dose adjusted.  Patient was getting 5 mg dose for moderate pain.  For severe pain will adjust to 1 tab 7.5 and monitor and try this but if not controlled could consider adjust to 10 mg dose. 4. Mood/Behavior/Sleep: LCSW to follow for evaluation and support.              -antipsychotic agents: N/A 5. Neuropsych/cognition: This patient is capable of making decisions on his own behalf. 6. Skin/Wound Care: Routine pressure relief measures.  7. Fluids/Electrolytes/Nutrition: Monitor I/O. Check CMET in am              --encourage intake  8. Pre-renal azotemia: BUN/SCr 18/0.85 at admission-->26/0.91-->18/0.82  5/16- BUN 13 and Cr 0.72- doing better              --encourage fluid intake  -Recheck tomorrow 9. Acute blood loss anemia: Monitor  for signs of bleeding. Recheck CBC in am             --Hgb down from 14.0-->10.1 likely due to post op loss.   5/16- Hb up to 10.9 today  Recheck tomrrow 10. Recurrent hypokalemia: Improved after supplement. Recheck in am.  11. Constipation: Sorbitol  today. Increase Senna S to 2 tabs bid.    5/16- no results- ordered soap suds enema to occur as soon as possible and not interfere with therapy.   5/17 multiple BM last night, continue to monitor to prevent recurrence of constipation  5/18 LBM yesetrday 12. Diet controlled DM: Continue CM/make heart healthy diet.  Fair control, continue to monitor  CBG (last 3)  Recent Labs    01/26/24 2141 01/27/24 0626 01/27/24 1212  GLUCAP 166* 149* 193*    13. CAD/SSS/A fib: Monitor HR TID. On Xarelto . Heart healthy restrictions added per requrest. 14. HTN/Orthostatic hypotension: Monitor BP TID-on lisinopril  and amlodipine .   5/16- BP elevated to 160s this AM- but normally 130s-140s- con't to monitor trend  5/17 BP intermittently elevated although has had some issues with orthostatic hypotension., abd binder/TEDS for [facility]  5/18 BP stable this morning, continue to monitor trend      01/27/2024    6:23 AM 01/26/2024    8:24 PM 01/26/2024    1:34 PM  Vitals with BMI  Systolic 125 143 409  Diastolic 70 55 41  Pulse 72 73 79    15. Hx BPH: Toilet every 4 hours while awake.  16. Confusion overnight and urinary frequency/urgency  5/16- will check U/A and Cx  UTI treatment started, confusion appears to be improved 17. UTI  -5/17 Start keflex , monitor cultures   LOS: 3 days A FACE TO FACE EVALUATION WAS PERFORMED  Lylia Sand 01/27/2024, 12:20 PM

## 2024-01-28 ENCOUNTER — Ambulatory Visit: Payer: Medicare Other | Admitting: Podiatry

## 2024-01-28 LAB — URINE CULTURE: Culture: 60000 — AB

## 2024-01-28 LAB — BASIC METABOLIC PANEL WITH GFR
Anion gap: 8 (ref 5–15)
BUN: 16 mg/dL (ref 8–23)
CO2: 27 mmol/L (ref 22–32)
Calcium: 9.2 mg/dL (ref 8.9–10.3)
Chloride: 100 mmol/L (ref 98–111)
Creatinine, Ser: 0.82 mg/dL (ref 0.61–1.24)
GFR, Estimated: >60 mL/min (ref >60)
Glucose, Bld: 151 mg/dL — ABNORMAL HIGH (ref 70–99)
Potassium: 3.5 mmol/L (ref 3.5–5.1)
Sodium: 135 mmol/L (ref 135–145)

## 2024-01-28 LAB — CBC
HCT: 34.4 % — ABNORMAL LOW (ref 39.0–52.0)
Hemoglobin: 11 g/dL — ABNORMAL LOW (ref 13.0–17.0)
MCH: 30.6 pg (ref 26.0–34.0)
MCHC: 32 g/dL (ref 30.0–36.0)
MCV: 95.8 fL (ref 80.0–100.0)
Platelets: 314 10*3/uL (ref 150–400)
RBC: 3.59 MIL/uL — ABNORMAL LOW (ref 4.22–5.81)
RDW: 14.8 % (ref 11.5–15.5)
WBC: 5 10*3/uL (ref 4.0–10.5)
nRBC: 0 % (ref 0.0–0.2)

## 2024-01-28 NOTE — Progress Notes (Signed)
 Occupational Therapy Session Note  Patient Details  Name: Sean Lane MRN: [id number] Date of Birth: 08/28/1928  Today's Date: 01/28/2024 OT Individual Time: 7846-9629 OT Individual Time Calculation (min): 70 min    Short Term Goals: Week 1:  OT Short Term Goal 1 (Week 1): LTG=STG  Skilled Therapeutic Interventions/Progress Updates:    Pt seated in w/c upon arrival with wife present. Pt reported increased pain and discomfort and requested to return to bed (see pain below.) Stand pivot transfer with mod A 2/2 increased pain.. Once seated EOB, pt able to scoot to [facility] without assistance. Sit>supine with mod A (assist for RLE mgmt onto bed.) Min A for repositioning in bed. Initiated discharge planning and home setup. Reviewed LTG. BUE therex with 4# bar (chest presses and stratight arm raises) 4x10. Pt issued red threaband-punches 4x10. Discussed pt taking shower in AM. Pt agreeable. Pt remained in bed with all needs within reach. Bed alarm activated. Wife present.  Therapy Documentation Precautions:  Precautions Precautions: Fall Required Braces or Orthoses: Spinal Brace Spinal Brace: Lumbar corset (OOB) Restrictions Weight Bearing Restrictions Per Provider Order: No RLE Weight Bearing Per Provider Order: Weight bearing as tolerated   Pain: Pt c/o 8/10 low back pain; repositioned and meds admin by RN during session   Therapy/Group: Individual Therapy  [name] 01/28/2024, 12:09 PM

## 2024-01-28 NOTE — Progress Notes (Signed)
 Met with patient and wife to review current situation, team conference and plan of care. Reviewed medications incision care , hydration, safety. Continue to follow along to provide educational needs to facilitate preparation for discharge.

## 2024-01-28 NOTE — Progress Notes (Signed)
 [facility]patient Rehabilitation Center Individual Statement of Services  Patient Name:  Sean Lane  Date:  01/28/2024  Welcome to the Inpatient Rehabilitation [facility].  Our goal is to provide you with an individualized program based on your diagnosis and situation, designed to meet your specific needs.  With this comprehensive rehabilitation program, you will be expected to participate in at least 3 hours of rehabilitation therapies Monday-Friday, with modified therapy programming on the weekends.  Your rehabilitation program will include the following services:  Physical Therapy (PT), Occupational Therapy (OT), 24 hour per day rehabilitation nursing, Neuropsychology, Care Coordinator, Rehabilitation Medicine, Nutrition Services, and Pharmacy Services  Weekly team conferences will be held on Tuesday to discuss your progress.  Your Inpatient Rehabilitation Care Coordinator will talk with you frequently to get your input and to update you on team discussions.  Team conferences with you and your family in attendance may also be held.  Expected length of stay: 7-10 days  Overall anticipated outcome: supervision with device  Depending on your progress and recovery, your program may change. Your Inpatient Rehabilitation Care Coordinator will coordinate services and will keep you informed of any changes. Your Inpatient Rehabilitation Care Coordinator's name and contact numbers are listed  below.  The following services may also be recommended but are not provided by the Inpatient Rehabilitation Center:  Driving Evaluations Home Health Rehabiltiation Services Outpatient Rehabilitation Services    Arrangements will be made to provide these services after discharge if needed.  Arrangements include referral to agencies that provide these services.  Your insurance has been verified to be:  Counsellor Your primary doctor is:  Clarisa Crooked  Pertinent information will be shared with  your doctor and your insurance company.  Inpatient Rehabilitation Care Coordinator:  Kathey Pang [phone] or ([phone]  Information discussed with and copy given to patient by: Mardell Shade, 01/28/2024, 1:51 PM

## 2024-01-28 NOTE — Progress Notes (Signed)
 Physical Therapy Session Note  Patient Details  Name: Sean Lane MRN: [id number] Date of Birth: [birth year]  Today's Date: 01/28/2024 PT Individual Time: [phone], 1344-1450 PT Individual Time Calculation (min): 58 min, 66 min   Short Term Goals: Week 1:  PT Short Term Goal 1 (Week 1): =LTGs d/t ELOS  Skilled Therapeutic Interventions/Progress Updates:    Session 1: pt received in bed and agreeable to therapy. Pt reports 0.5/10 pain at rest, up to 8/10 while transferring, and down to 3/10 with seated rest break. premedicated. Rest and positioning provided as needed. Also discussed appropriate levels of pain and when to be concerned. Also educated on importance of movement for proper healing and minimizing long term pain. Pt expressed understanding. Pt notably more aware and alert this session vs previous encounter 2 days ago.   Bed mobiltiy with min a for RLE management and cues for log roll. Sit to stand and Stand pivot transfer with CGA overall from elevated bed. Pt with difficulty placing full weight on RLE d/t pain.   Pt propelled w/c with BUE for endurance and functional mobility. Expressed being glad to use it as exercise. Pt then participated in seated LE exercise, including heel slides on washcloth and LAQ, with 5lb ankle weight on LLE. Discussed pain free activity for improving activity tolerance and improved ROM. Pt returned to room and remained in w/c with his wife present and needs in reach.   Session 2: pt received in bed and agreeable to therapy. Declines OOB therapy at this time d/t fatigue and high pain levels after sitting in w/c x 1 hour this am. Encouraged pt to attempt sitting up later in the day. Lengthy discussion on benefits of sitting upright, increasing upright tolerance, and relating to patient centered goals of accessing home and sitting up for meals at discharge. Discussed   Pt performed the following exercises to promote LE strength and endurance, emphasis  on pain free ROM and education on intensity if performing independently:  Quad set on towel roll 3 x 20 Short range heel slides 3 x 20, progressed to using red band x 10 for muscle strength vs ROM Hip internal and external rotation 3 x 20  Leg press against red band 3 x 20 with LLE only Active assist ROM using gait belt to allow control and therapist supporting lower leg 3 x 10 with RLE  Also discussed home entrance and home set up, viewed pictures of home entrance options, and discussed ELOS and to expect update after conference tomorrow. Pt and wife expressed understanding, pt was left with all needs in reach and alarm active.    Therapy Documentation Precautions:  Precautions Precautions: Fall Required Braces or Orthoses: Spinal Brace Spinal Brace: Lumbar corset (OOB) Restrictions Weight Bearing Restrictions Per Provider Order: No RLE Weight Bearing Per Provider Order: Weight bearing as tolerated General:       Therapy/Group: Individual Therapy  Tex Filbert 01/28/2024, 12:41 PM
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
## 2024-01-28 NOTE — Progress Notes (Signed)
 PROGRESS NOTE   Subjective/Complaints:  Pt reports LBM 2 days ago- doesn't feel bad right now, because was cleaned out that day.   Missed OT Saturday due to sedation.  Cold packs on R upper thigh has gotten rid of 90% of RLE pain.   Per nurse, slept well and pt/wife agree.    ROS: Negative except for HPI   Pt denies SOB, abd pain, CP, N/V/C/D, and vision changes  Objective:   No results found. Recent Labs    01/28/24 0650  WBC 5.0  HGB 11.0*  HCT 34.4*  PLT 314   Recent Labs    01/28/24 0650  NA 135  K 3.5  CL 100  CO2 27  GLUCOSE 151*  BUN 16  CREATININE 0.82  CALCIUM  9.2    Intake/Output Summary (Last 24 hours) at 01/28/2024 1023 Last data filed at 01/28/2024 0751 Gross per 24 hour  Intake 829.5 ml  Output 400 ml  Net 429.5 ml        Physical Exam: Vital Signs Blood pressure (!) 121/94, pulse 63, temperature 98.6 F (37 C), temperature source Oral, resp. rate 18, height 6\' 2"  (1.88 m), weight 79.3 kg, SpO2 90%.     General: awake, alert, appropriate,  sitting up in bed; wife at side; NAD HENT: conjugate gaze; oropharynx moist CV: regular rate and irregular rhythm; no JVD Pulmonary: CTA B/L; no W/R/R- good air movement GI: soft, NT, ND, (+)BS- slightly hypoactive Psychiatric: appropriate- no confusion this AM Neurological: Ox3     Cervical back: Neck supple. No tenderness.     Comments: Surgical dressing on right anterior hip. RLE limited by pain.    Ue's 5-/5 LLE 5-/5 throughout LLE- HF 2/5- limited by pain- KE- "Cannot do" due to pain; DF/PF 5-/5 EHL 4+/5  Skin:    General: Skin is warm and dry.     Comments: Anterior R hip surgical dressing moderate swelling, no surrounding erythema or drainage noted  Neurological:     Mental Status: He is alert and oriented to person, place, and time.     Comments: Intact to light touch in all 4 extremities  Assessment/Plan: 1. Functional  deficits which require 3+ hours per day of interdisciplinary therapy in a comprehensive inpatient rehab setting. Physiatrist is providing close team supervision and 24 hour management of active medical problems listed below. Physiatrist and rehab team continue to assess barriers to discharge/monitor patient progress toward functional and medical goals  Care Tool:  Bathing    Body parts bathed by patient: Right arm, Left arm, Chest, Abdomen, Front perineal area, Face   Body parts bathed by helper: Buttocks, Right upper leg, Left upper leg Body parts n/a: Right lower leg, Left lower leg   Bathing assist Assist Level: Minimal Assistance - Patient > 75%     Upper Body Dressing/Undressing Upper body dressing   What is the patient wearing?: Pull over shirt    Upper body assist Assist Level: Moderate Assistance - Patient 50 - 74%    Lower Body Dressing/Undressing Lower body dressing      What is the patient wearing?: Pants     Lower body assist Assist for lower body  dressing: Total Assistance - Patient < 25%     Toileting Toileting    Toileting assist Assist for toileting: Total Assistance - Patient < 25%     Transfers Chair/bed transfer  Transfers assist     Chair/bed transfer assist level: Moderate Assistance - Patient 50 - 74%     Locomotion Ambulation   Ambulation assist   Ambulation activity did not occur: Safety/medical concerns          Walk 10 feet activity   Assist  Walk 10 feet activity did not occur: Safety/medical concerns        Walk 50 feet activity   Assist Walk 50 feet with 2 turns activity did not occur: Safety/medical concerns         Walk 150 feet activity   Assist Walk 150 feet activity did not occur: Safety/medical concerns         Walk 10 feet on uneven surface  activity   Assist Walk 10 feet on uneven surfaces activity did not occur: Safety/medical concerns         Wheelchair     Assist Is the patient  using a wheelchair?: Yes Type of Wheelchair: Manual Wheelchair activity did not occur: Safety/medical concerns         Wheelchair 50 feet with 2 turns activity    Assist    Wheelchair 50 feet with 2 turns activity did not occur: Safety/medical concerns   Assist Level: Dependent - Patient 0%   Wheelchair 150 feet activity     Assist  Wheelchair 150 feet activity did not occur: Safety/medical concerns       Blood pressure (!) 121/94, pulse 63, temperature 98.6 F (37 C), temperature source Oral, resp. rate 18, height 6\' 2"  (1.88 m), weight 79.3 kg, SpO2 90%.  Medical Problem List and Plan: 1. Functional deficits secondary to R femoral neck fx secondary to fall with R hemiarthroplasty             -patient may  shower- cover surgical incision             -ELOS/Goals: 7-10 days supervision             Con't CIR PT and OT  Team conference tomorrow to determine LOS 2.  Antithrombotics: -DVT/anticoagulation:  Pharmaceutical: Xarelto              -antiplatelet therapy: N/A 3.Recent L4 endplate Fx/ Pain Management after hip fx: On Cymbalta . Continue  hydrocodone  prn.    5/16- not taking Norco right now- and still confused last night- wife has been refusing Tylenol  500 mg scheduled just taking 650 mg prn             --LSO ordered for support  5/17 hydrocodone  dose adjusted.  Patient was getting 5 mg dose for moderate pain.  For severe pain will adjust to 1 tab 7.5 and monitor and try this but if not controlled could consider adjust to 10 mg dose.  5/19- pt reports pain doing well- better- so doesn't want change ot meds at this time 4. Mood/Behavior/Sleep: LCSW to follow for evaluation and support.              -antipsychotic agents: N/A 5. Neuropsych/cognition: This patient is capable of making decisions on his own behalf. 6. Skin/Wound Care: Routine pressure relief measures.  7. Fluids/Electrolytes/Nutrition: Monitor I/O. Check CMET in am              --encourage intake  8.  Pre-renal azotemia: BUN/SCr  18/0.85 at admission-->26/0.91-->18/0.82  5/16- BUN 13 and Cr 0.72- doing better             --encourage fluid intake  -Recheck tomorrow  5/19- 16 and 0.82- doin gbetter-  9. Acute blood loss anemia: Monitor for signs of bleeding. Recheck CBC in am             --Hgb down from 14.0-->10.1 likely due to post op loss.   5/16- Hb up to 10.9 today  5/19- Hb 11.0  Recheck tomrrow 10. Recurrent hypokalemia: Improved after supplement. Recheck in am.  11. Constipation: Sorbitol  today. Increase Senna S to 2 tabs bid.    5/16- no results- ordered soap suds enema to occur as soon as possible and not interfere with therapy.   5/17 multiple BM last night, continue to monitor to prevent recurrence of constipation  5/18 LBM yesterday  5/19- LBM 2 days ago when had multiple BM's 12. Diet controlled DM: Continue CM/make heart healthy diet.  Fair control, continue to monitor   5/19- CBGs overall controlled, but spiked yesterday at noon- will monitor-  CBG (last 3)  Recent Labs    01/27/24 0626 01/27/24 1212 01/27/24 1632  GLUCAP 149* 193* 148*    13. CAD/SSS/A fib: Monitor HR TID. On Xarelto . Heart healthy restrictions added per requrest. 14. HTN/Orthostatic hypotension: Monitor BP TID-on lisinopril  and amlodipine .   5/16- BP elevated to 160s this AM- but normally 130s-140s- con't to monitor trend  5/17 BP intermittently elevated although has had some issues with orthostatic hypotension., abd binder/TEDS for [facility]  5/18 -5/19- BP stable this morning, continue to monitor trend      01/28/2024    4:23 AM 01/27/2024    8:03 PM 01/27/2024    1:33 PM  Vitals with BMI  Systolic 121 135 161  Diastolic 94 61 49  Pulse 63 70 69    15. Hx BPH: Toilet every 4 hours while awake.  16. Confusion overnight and urinary frequency/urgency  5/16- will check U/A and Cx  UTI treatment started, confusion appears to be improved 17. UTI  -5/17 Start keflex , monitor cultures  5/19- based  on Cx's that came back, Keflex  should be appropriate - sensitive to both Klebsiella pneumo; and Proteus.     I spent a total of 41   minutes on total care today- >50% coordination of care- due to  D/w pt and wife about U/A and U Cx results= went over CBC/BMP- and BG's- as well as flowsheets- also d/w nursing   LOS: 4 days A FACE TO FACE EVALUATION WAS PERFORMED  Sean Lane 01/28/2024, 10:23 AM

## 2024-01-29 MED ORDER — ENSURE ENLIVE PO LIQD
237.0000 mL | Freq: Two times a day (BID) | ORAL | Status: DC
  Administered 2024-01-29 – 2024-02-04 (×12): 237 mL via ORAL
  Filled 2024-01-29: qty 237

## 2024-01-29 NOTE — Progress Notes (Signed)
 PROGRESS NOTE   Subjective/Complaints:  Pt reports BM this AM however was dizzy afterwards- per nurse, wanted bedpan, but pt reports they attempted to use bedpan x3 and then he asked to use toilet, which was finally successful.  Per wife, pt was also confused for short period last night, but back to baseline this AM.  Poor appetite, only ate fruit for breakfast- wife ate 50% of plate per pt insistence. Likes Ensure though- so wants ot have Rx.    R foot feels like it's splitting in half- and that sock squeezing foot- does have mild edema of dorsum of R foot.    ROS: Negative except for HPI   Pt denies SOB, abd pain, CP, N/V/C/D, and vision changes   Objective:   No results found. Recent Labs    01/28/24 0650  WBC 5.0  HGB 11.0*  HCT 34.4*  PLT 314   Recent Labs    01/28/24 0650  NA 135  K 3.5  CL 100  CO2 27  GLUCOSE 151*  BUN 16  CREATININE 0.82  CALCIUM  9.2    Intake/Output Summary (Last 24 hours) at 01/29/2024 0853 Last data filed at 01/29/2024 0813 Gross per 24 hour  Intake 594 ml  Output 200 ml  Net 394 ml        Physical Exam: Vital Signs Blood pressure (!) 130/56, pulse 73, temperature 98.1 F (36.7 C), resp. rate 17, height 6\' 2"  (1.88 m), weight 79.3 kg, SpO2 98%.     General: awake, alert, appropriate,  sitting up in bed; wife at bedside; NAD HENT: conjugate gaze; oropharynx moist CV: regular rate and irregular rhythm- chronic; ; no JVD Pulmonary: CTA B/L; no W/R/R- good air movement GI: soft, NT, ND, (+)BS Psychiatric: appropriate Neurological: Ox3 this AM, but is HOH even with hearing aids       Cervical back: Neck supple. No tenderness.     Comments: Surgical dressing on right anterior hip. RLE limited by pain.  stable   Ue's 5-/5 LLE 5-/5 throughout LLE- HF 2/5- limited by pain- KE- "Cannot do" due to pain; DF/PF 5-/5 EHL 4+/5  Skin:    General: Skin is warm and dry.      Comments: Anterior R hip surgical dressing moderate swelling, no surrounding erythema or drainage noted  Neurological:     Mental Status: He is alert and oriented to person, place, and time.     Comments: Intact to light touch in all 4 extremities  Assessment/Plan: 1. Functional deficits which require 3+ hours per day of interdisciplinary therapy in a comprehensive inpatient rehab setting. Physiatrist is providing close team supervision and 24 hour management of active medical problems listed below. Physiatrist and rehab team continue to assess barriers to discharge/monitor patient progress toward functional and medical goals  Care Tool:  Bathing    Body parts bathed by patient: Right arm, Left arm, Chest, Abdomen, Front perineal area, Face   Body parts bathed by helper: Buttocks, Right upper leg, Left upper leg Body parts n/a: Right lower leg, Left lower leg   Bathing assist Assist Level: Minimal Assistance - Patient > 75%     Upper Body Dressing/Undressing Upper body  dressing   What is the patient wearing?: Pull over shirt    Upper body assist Assist Level: Moderate Assistance - Patient 50 - 74%    Lower Body Dressing/Undressing Lower body dressing      What is the patient wearing?: Pants     Lower body assist Assist for lower body dressing: Total Assistance - Patient < 25%     Toileting Toileting    Toileting assist Assist for toileting: Total Assistance - Patient < 25%     Transfers Chair/bed transfer  Transfers assist     Chair/bed transfer assist level: Contact Guard/Touching assist     Locomotion Ambulation   Ambulation assist   Ambulation activity did not occur: Safety/medical concerns          Walk 10 feet activity   Assist  Walk 10 feet activity did not occur: Safety/medical concerns        Walk 50 feet activity   Assist Walk 50 feet with 2 turns activity did not occur: Safety/medical concerns         Walk 150 feet  activity   Assist Walk 150 feet activity did not occur: Safety/medical concerns         Walk 10 feet on uneven surface  activity   Assist Walk 10 feet on uneven surfaces activity did not occur: Safety/medical concerns         Wheelchair     Assist Is the patient using a wheelchair?: Yes Type of Wheelchair: Manual Wheelchair activity did not occur: Safety/medical concerns  Wheelchair assist level: Supervision/Verbal cueing Max wheelchair distance: 150    Wheelchair 50 feet with 2 turns activity    Assist    Wheelchair 50 feet with 2 turns activity did not occur: Safety/medical concerns   Assist Level: Supervision/Verbal cueing   Wheelchair 150 feet activity     Assist  Wheelchair 150 feet activity did not occur: Safety/medical concerns   Assist Level: Supervision/Verbal cueing   Blood pressure (!) 130/56, pulse 73, temperature 98.1 F (36.7 C), resp. rate 17, height 6\' 2"  (1.88 m), weight 79.3 kg, SpO2 98%.  Medical Problem List and Plan: 1. Functional deficits secondary to R femoral neck fx secondary to fall with R hemiarthroplasty             -patient may  shower- cover surgical incision             -ELOS/Goals: 7-10 days supervision             Con't CIR PT and OT  Team conference today to determine length of stay 2.  Antithrombotics: -DVT/anticoagulation:  Pharmaceutical: Xarelto              -antiplatelet therapy: N/A 3.Recent L4 endplate Fx/ Pain Management after hip fx: On Cymbalta . Continue  hydrocodone  prn.    5/16- not taking Norco right now- and still confused last night- wife has been refusing Tylenol  500 mg scheduled just taking 650 mg prn             --LSO ordered for support  5/17 hydrocodone  dose adjusted.  Patient was getting 5 mg dose for moderate pain.  For severe pain will adjust to 1 tab 7.5 and monitor and try this but if not controlled could consider adjust to 10 mg dose.  5/19- pt reports pain doing well- better- so doesn't  want change ot meds at this time  5/20 pain doing a little better- con't regimen 4. Mood/Behavior/Sleep: LCSW to follow for evaluation and  support.              -antipsychotic agents: N/A 5. Neuropsych/cognition: This patient is capable of making decisions on his own behalf. 6. Skin/Wound Care: Routine pressure relief measures.  7. Fluids/Electrolytes/Nutrition: Monitor I/O. Check CMET in am              --encourage intake  8. Pre-renal azotemia: BUN/SCr 18/0.85 at admission-->26/0.91-->18/0.82  5/16- BUN 13 and Cr 0.72- doing better             --encourage fluid intake  -Recheck tomorrow  5/19- 16 and 0.82- doing better-  9. Acute blood loss anemia: Monitor for signs of bleeding. Recheck CBC in am             --Hgb down from 14.0-->10.1 likely due to post op loss.   5/16- Hb up to 10.9 today  5/19- Hb 11.0  Recheck tomrrow 10. Recurrent hypokalemia: Improved after supplement. Recheck in am.  11. Constipation: Sorbitol  today. Increase Senna S to 2 tabs bid.    5/16- no results- ordered soap suds enema to occur as soon as possible and not interfere with therapy.   5/17 multiple BM last night, continue to monitor to prevent recurrence of constipation  5/18 LBM yesterday  5/19- LBM 2 days ago when had multiple BM's  5/20- LBM overnight-  12. Diet controlled DM: Continue CM/make heart healthy diet.  Fair control, continue to monitor   5/19- CBGs overall controlled, but spiked yesterday at noon- will monitor-   5/20- continues to spike at noon- likely Ensure- will monitor CBG (last 3)  Recent Labs    01/27/24 0626 01/27/24 1212 01/27/24 1632  GLUCAP 149* 193* 148*    13. CAD/SSS/A fib: Monitor HR TID. On Xarelto . Heart healthy restrictions added per requrest. 14. HTN/Orthostatic hypotension: Monitor BP TID-on lisinopril  and amlodipine .   5/16- BP elevated to 160s this AM- but normally 130s-140s- con't to monitor trend  5/17 BP intermittently elevated although has had some issues  with orthostatic hypotension., abd binder/TEDS for [facility]  5/18 -5/19- BP stable this morning, continue to monitor trend  5/20- had dizzy episode this AM, where sounds like BP dropped- didn't have syncope, etc, but was c/o dizziness- will monitor    01/29/2024    5:13 AM 01/28/2024    8:10 PM 01/28/2024    3:49 PM  Vitals with BMI  Systolic 130 130 130  Diastolic 56 63 61  Pulse 73 78 88    15. Hx BPH: Toilet every 4 hours while awake.  16. Confusion overnight and urinary frequency/urgency  5/16- will check U/A and Cx  UTI treatment started, confusion appears to be improved 17. UTI  -5/17 Start keflex , monitor cultures  5/19- based on Cx's that came back, Keflex  should be appropriate - sensitive to both Klebsiella pneumo; and Proteus.  18. Poor appetite-  5/20- will order Ensure 2x/day between meals 19. R foot pain  5/20- mild swelling on dorsum of foot- that's TTP- will monitor- could be swelling from R hip working down?   I spent a total of   42  minutes on total care today- >50% coordination of care- due to  Team conference today to determine length of stay- also as detailed above- made multiple changes.   LOS: 5 days A FACE TO FACE EVALUATION WAS PERFORMED  Pruitt Taboada 01/29/2024, 8:53 AM

## 2024-01-29 NOTE — Discharge Instructions (Signed)
 Inpatient Rehab Discharge Instructions  Sean Lane Discharge date and time: 02/05/24   Activities/Precautions/ Functional Status: Activity: no lifting, driving, or strenuous exercise for till cleared by  MD Diet: diabetic diet Wound Care: keep wound clean and dry. Contact Dr.Swinteck if you develop any problems with your incision/wound--redness, swelling, increase in pain, drainage or if you develop fever or chills.    Functional status:  ___ No restrictions     ___ Walk up steps independently _X__ 24/7 supervision/assistance   ___ Walk up steps with assistance ___ Intermittent supervision/assistance  ___ Bathe/dress independently ___ Walk with walker     ___ Bathe/dress with assistance ___ Walk Independently    ___ Shower independently _X__ Walk with assistance    _X__ Shower with assistance _X__ No alcohol     ___ Return to work/school ________   Special Instructions: 1.  Wear binder when out of bed and if walking. This will support your blood pressure.  Remove when you are seated or in bed.  2. Wear support stockings also.  Drink plenty of fluids.  3. Do your home exercises 2-3 times a day.  4. Can use Miralax  also for constipation. This will resolve as you stop using narcotics.  5. Wear back brace for support of the fracture. Can wean it off in 4-5 weeks as symptoms improve.    COMMUNITY REFERRALS UPON DISCHARGE:    Home Health:   PT  &   OT                Agency:[facility] Phone:[phone]   Medical Equipment/Items Ordered:WHEELCHAIR, SHOWER CHAIR AND 3 IN 1                                                 Agency/Supplier:[facility]  [phone]    My questions have been answered and I understand these instructions. I will adhere to these goals and the provided educational materials after my discharge from the hospital.  Patient/Caregiver Signature _______________________________ Date __________  Clinician Signature  _______________________________________ Date __________  Please bring this form and your medication list with you to all your follow-up doctor's appointments.

## 2024-01-29 NOTE — Progress Notes (Signed)
 Occupational Therapy Session Note  Patient Details  Name: Sean Lane MRN: [id number] Date of Birth: 10/16/1927  Today's Date: 01/29/2024 OT Individual Time: 0347-4259 OT Individual Time Calculation (min): 70 min    Short Term Goals: Week 1:  OT Short Term Goal 1 (Week 1): LTG=STG  Skilled Therapeutic Interventions/Progress Updates:    Skilled OT services with focus on bed mobility, sitting balance, sit<>stand, standing balance, bathing/dressing with sit<>stand from EOB, and safety awareness to increase independence with BADLs. Pt's wife present during session. Supine>sit EOB with mod A and verbal cues for technique. Sitting balance with supervision. Pt with c/o lightheadedness immediately after sitting but quickly resolved. Thigh high Ted hose donned while seated EOB. Pt assisted pulling over knees. UB bathing/dressing with supervision seated EOB. LB dressing with mod A with sit<>stand from EOB. Pt used reacher to assist with threading BLE into pants. Sit<>stand from EOB with CGA. CGA for standing balance. Pt assisted with pulling pants over hips. Side stepping to [facility] at end of session. Sit>supine with mod A for RLE mgmt onto bed. Pt able to reposition in bed with max verbal cues for technique. Pt remained in bed with all needs within reah. Wife present.   Therapy Documentation Precautions:  Precautions Precautions: Fall Required Braces or Orthoses: Spinal Brace Spinal Brace: Lumbar corset (OOB) Restrictions Weight Bearing Restrictions Per Provider Order: Yes RLE Weight Bearing Per Provider Order: Weight bearing as tolerated   Pain: Pt c/o 7/10 Rt hip pain; meds admin prior to therapy  Therapy/Group: Individual Therapy  [name] 01/29/2024, 9:28 AM

## 2024-01-29 NOTE — Progress Notes (Addendum)
 Patient ID: Sean Lane, male   DOB: [birth year], 89 y.o.   MRN: [id number]  Have ordered wheelchair, shower seat and bedside commode via [employer] in preparation for discharge 5/27. Referral made to [facility] for follow up

## 2024-01-29 NOTE — Progress Notes (Signed)
 Occupational Therapy Session Note  Patient Details  Name: Sean Lane MRN: [id number] Date of Birth: 1928/08/01  Today's Date: 01/29/2024 OT Individual Time: 1135-1205 OT Individual Time Calculation (min): 30 min    Short Term Goals: Week 1:  OT Short Term Goal 1 (Week 1): LTG=STG  Skilled Therapeutic Interventions/Progress Updates:    Pt resting in bed upon arrival. Pt requested to change his underpants to a boxer style. Skilled OT ervices with focus on LB dressing and discharge planning. DME recommendations. Supine>sit EOB with mod A. Sit<>stand with CGA. Pt required assistance doffing pants without AE. Mod A for donning pants with verbal cues to thread RLE before LLE. Pt pulled pants over hips with CGA. Sit>supne with mod A. Pt reqpositioned in bed with verbal cues for technique. Pt remained in bed with all needs wihtin reach. Wife present.   Therapy Documentation Precautions:  Precautions Precautions: Fall Required Braces or Orthoses: Spinal Brace Spinal Brace: Lumbar corset (OOB) Restrictions Weight Bearing Restrictions Per Provider Order: Yes RLE Weight Bearing Per Provider Order: Weight bearing as tolerated    Pain:Pt c/o 8/10 RLE pain; meds admin prior to therapy and repositioned  Therapy/Group: Individual Therapy  [name] 01/29/2024, 12:13 PM

## 2024-01-29 NOTE — Progress Notes (Signed)
 Physical Therapy Session Note  Patient Details  Name: Sean Lane MRN: [id number] Date of Birth: 1928/05/27  Today's Date: 01/29/2024 PT Individual Time: 1015-1100, 1445-1530 PT Individual Time Calculation (min): 45 min, 45 min   Short Term Goals: Week 1:  PT Short Term Goal 1 (Week 1): =LTGs d/t ELOS  Skilled Therapeutic Interventions/Progress Updates:    Session 1: pt received in bed and agreeable to therapy. Pt reports 8/10 hip pain at rest and high pain levels in R foot that worsened with activity. Nsg provided medication at start of session. Edema noted in RLE, attempted ankle pumps for edema management, but pt grimacing in pain so discontinued.   Session focused on bed level exercise for strength. Pt performed L hip internal and external rotation against red band 3 x 12. Progressed to attempting bridge with towel bolster, pt able to perform small range x 6. Pt remained in bed, agreeable to getting up later in the day.  During session, provided education on importance of good nutrition for healing. Assisted with finding items on menu which pt is willing to eat and provide protein to assist in healing. Pt remained in bed and was left with all needs in reach and alarm active.   Session 2: pt received in bed and agreeable to therapy. Pt reports pain levels improved from am session, premedicated. Rest and positioning provided as needed. Pt and wife expressed concerns with stated discharge date. Provided education on how this date had been reached and encouraged both to wait until later in the week to see how much progress has been made. Provided assurance that goal of rehab is safety at home and plan to reach goals before discharge.  Bed mobility with min a for RLE management and min a for trunk elevation. Donned brace with assist. Pt transported to therapy gym for time management and energy conservation.   Pt performed stair navigation with mod a and lateral technique to mimic home  environment. Pt with difficulty placing full weight on RLE at this time resulting in increased need for assist.  Pt propelled w/c back to room with x1 rest break for endurance and functional mobility. Pt remained up in w/c with plan to return to bed in 15-30 min to increase upright tolerance. NT made aware of position and plan to return pt to bed shortly.   Therapy Documentation Precautions:  Precautions Precautions: Fall Required Braces or Orthoses: Spinal Brace Spinal Brace: Lumbar corset (OOB) Restrictions Weight Bearing Restrictions Per Provider Order: Yes RLE Weight Bearing Per Provider Order: Weight bearing as tolerated General:       Therapy/Group: Individual Therapy  Tex Filbert 01/29/2024, 10:37 AM

## 2024-01-29 NOTE — Progress Notes (Addendum)
 Patient ID: Sean Lane, male   DOB: 02/27/1928, 89 y.o.   MRN: [id number] Met with the patient and wife to review team conference report. Reviewed recommending d/c 02/05/24 with goals for supervision - mod I overall. Reviewed HH PT/OT follow up; [facility] accepted referral and will initiate services upon discharge. DME recommended: BSC, shower seat with back and wheelchair ordered through [employer]. Wife has requested assistance for transportation home due to environmental barriers/steps. Transport to be arranged for discharge. Patient and wife acknowledge an understanding are in agreement with the discharge date. Naoma Bacca

## 2024-01-29 NOTE — Progress Notes (Signed)
 Orthopedic Tech Progress Note Patient Details:  Sean Lane [birth year] [id number]  Ortho Devices Type of Ortho Device: Abdominal binder Ortho Device/Splint Location: STOMACH Ortho Device/Splint Interventions: Ordered, Other (comment)patient was working with "THERAPY" this morning and I handed the box of to them at the door    Post Interventions Patient Tolerated: Well Instructions Provided: Care of device  [name] 01/29/2024, 8:44 AM

## 2024-01-30 NOTE — Plan of Care (Signed)
  Problem: Consults Goal: RH GENERAL PATIENT EDUCATION Description: See Patient Education module for education specifics. Outcome: Progressing   Problem: RH BOWEL ELIMINATION Goal: RH STG MANAGE BOWEL WITH ASSISTANCE Description: STG Manage Bowel with supervision Assistance. Outcome: Progressing   Problem: RH BLADDER ELIMINATION Goal: RH STG MANAGE BLADDER WITH ASSISTANCE Description: STG Manage Bladder With supervision Assistance Outcome: Progressing   Problem: RH SKIN INTEGRITY Goal: RH STG SKIN FREE OF INFECTION/BREAKDOWN Description: Manage skin free of infection/breakdown with supervision assistance Outcome: Progressing   Problem: RH SAFETY Goal: RH STG ADHERE TO SAFETY PRECAUTIONS W/ASSISTANCE/DEVICE Description: STG Adhere to Safety Precautions With supervision Assistance/Device. Outcome: Progressing   Problem: RH PAIN MANAGEMENT Goal: RH STG PAIN MANAGED AT OR BELOW PT'S PAIN GOAL Description: <4 w/ prns Outcome: Progressing   Problem: RH KNOWLEDGE DEFICIT GENERAL Goal: RH STG INCREASE KNOWLEDGE OF SELF CARE AFTER HOSPITALIZATION Description: Manage knowledge deficit with supervision assistance from wife using educational materials provided Outcome: Progressing

## 2024-01-30 NOTE — Progress Notes (Signed)
 Occupational Therapy Session Note  Patient Details  Name: Sean Lane MRN: [id number] Date of Birth: December 13, 1927  Today's Date: 01/30/2024 OT Individual Time: 0815-0930 OT Individual Time Calculation (min): 75 min  and Today's Date: 01/30/2024 OT Missed Time: 15 Minutes Missed Time Reason: Patient fatigue   Short Term Goals: Week 1:  OT Short Term Goal 1 (Week 1): LTG=STG  Skilled Therapeutic Interventions/Progress Updates:    Skilled OT intervention with focus on bed mobility, sit<>stand, standing balance, funcitonal amb with RW/transfers with RW, bathing at shower level, dressing with sit<>stand from EOB, activity tolerance, and safety awareness to increase independence with BADLs. Pt's wife present during session. Supine>sit EOB with min A and min verbal cues for technique. Sit<>stand from EOB with RW with CGA. Amb to bathroom with CGA to use toilet prior to transferring to shower seat in shower. Sit<>stand from toilet and shower seat with min A throughout session. Bathing with min A. Pt stood to bathe buttocks but required assistance with BLE/feet without AE. Pt amb with RW back to room and completed dressing with sit<>stand from EOB.Pt used reacher to thread BLE into pants. Pt donned pants with min A but required assistance donning socks without AE. Pt scooted to EOB with supervision. Sit>supine with supervision this morning (improvement from previous day.) Pt able to reposition in bed without assistance. Discussed practicing bed mobility without bed rails to simulate home envirionment. Pt fatigued but no reports of lightheadedness during session. Pt erquired multiple rest breaks during session. Discussed energy conservation strategies. Pt remained in bed with all needs wihtn reach. Bed alarm activated.   Therapy Documentation Precautions:  Precautions Precautions: Fall Required Braces or Orthoses: Spinal Brace Spinal Brace: Lumbar corset (OOB) Restrictions Weight Bearing  Restrictions Per Provider Order: Yes RLE Weight Bearing Per Provider Order: Weight bearing as tolerated General: General OT Amount of Missed Time: 15 Minutes Pain:  Pt reports increased RLE pain with activity; repositioned   Therapy/Group: Individual Therapy  [name] 01/30/2024, 9:40 AM

## 2024-01-30 NOTE — Progress Notes (Addendum)
 PROGRESS NOTE   Subjective/Complaints:  Pt reports that go confused last night- didn't know where he was and why he's here and kept wanting to get up.  Taking Melatonin- but overall sleeping well.  Got pain meds at 6am- slept better after that.   Has 8 STE Also needs w/c to go home with , per wife that PT told her.     ROS: Negative except for HPI   Pt denies SOB, abd pain, CP, N/V/C/D, and vision changes    Objective:   No results found. Recent Labs    01/28/24 0650  WBC 5.0  HGB 11.0*  HCT 34.4*  PLT 314   Recent Labs    01/28/24 0650  NA 135  K 3.5  CL 100  CO2 27  GLUCOSE 151*  BUN 16  CREATININE 0.82  CALCIUM  9.2    Intake/Output Summary (Last 24 hours) at 01/30/2024 0803 Last data filed at 01/29/2024 1800 Gross per 24 hour  Intake 356 ml  Output --  Net 356 ml        Physical Exam: Vital Signs Blood pressure (!) 134/55, pulse 71, temperature 97.8 F (36.6 C), resp. rate 16, height 6\' 2"  (1.88 m), weight 79.3 kg, SpO2 98%.      General: awake, alert, appropriate,  supine I bed; not wearing hearing aids; wife at bedside; NAD HENT: conjugate gaze; oropharynx moist CV: regular rate and rhythm; no JVD Pulmonary: CTA B/L; no W/R/R- good air movement GI: soft, NT, ND, (+)BS Psychiatric: appropriate but sounds that got confused last night Neurological: Ox3   MSK- surgical dressing still looks good- no erythema or drainage around incision.     Cervical back: Neck supple. No tenderness.     Comments: Surgical dressing on right anterior hip. RLE limited by pain.  stable   Ue's 5-/5 LLE 5-/5 throughout LLE- HF 2/5- limited by pain- KE- "Cannot do" due to pain; DF/PF 5-/5 EHL 4+/5  Skin:    General: Skin is warm and dry.     Comments: Anterior R hip surgical dressing moderate swelling, no surrounding erythema or drainage noted  Neurological:     Mental Status: He is alert and oriented  to person, place, and time.     Comments: Intact to light touch in all 4 extremities  Assessment/Plan: 1. Functional deficits which require 3+ hours per day of interdisciplinary therapy in a comprehensive inpatient rehab setting. Physiatrist is providing close team supervision and 24 hour management of active medical problems listed below. Physiatrist and rehab team continue to assess barriers to discharge/monitor patient progress toward functional and medical goals  Care Tool:  Bathing    Body parts bathed by patient: Right arm, Left arm, Chest, Abdomen, Front perineal area, Buttocks, Right upper leg, Left upper leg, Face   Body parts bathed by helper: Buttocks, Right upper leg, Left upper leg Body parts n/a: Right lower leg, Left lower leg   Bathing assist Assist Level: Minimal Assistance - Patient > 75%     Upper Body Dressing/Undressing Upper body dressing   What is the patient wearing?: Pull over shirt    Upper body assist Assist Level: Supervision/Verbal cueing  Lower Body Dressing/Undressing Lower body dressing      What is the patient wearing?: Pants, Underwear/pull up     Lower body assist Assist for lower body dressing: Moderate Assistance - Patient 50 - 74%     Toileting Toileting    Toileting assist Assist for toileting: Total Assistance - Patient < 25%     Transfers Chair/bed transfer  Transfers assist     Chair/bed transfer assist level: Contact Guard/Touching assist     Locomotion Ambulation   Ambulation assist   Ambulation activity did not occur: Safety/medical concerns          Walk 10 feet activity   Assist  Walk 10 feet activity did not occur: Safety/medical concerns        Walk 50 feet activity   Assist Walk 50 feet with 2 turns activity did not occur: Safety/medical concerns         Walk 150 feet activity   Assist Walk 150 feet activity did not occur: Safety/medical concerns         Walk 10 feet on uneven  surface  activity   Assist Walk 10 feet on uneven surfaces activity did not occur: Safety/medical concerns         Wheelchair     Assist Is the patient using a wheelchair?: Yes Type of Wheelchair: Manual Wheelchair activity did not occur: Safety/medical concerns  Wheelchair assist level: Supervision/Verbal cueing Max wheelchair distance: 150    Wheelchair 50 feet with 2 turns activity    Assist    Wheelchair 50 feet with 2 turns activity did not occur: Safety/medical concerns   Assist Level: Supervision/Verbal cueing   Wheelchair 150 feet activity     Assist  Wheelchair 150 feet activity did not occur: Safety/medical concerns   Assist Level: Supervision/Verbal cueing   Blood pressure (!) 134/55, pulse 71, temperature 97.8 F (36.6 C), resp. rate 16, height 6\' 2"  (1.88 m), weight 79.3 kg, SpO2 98%.  Medical Problem List and Plan: 1. Functional deficits secondary to R femoral neck fx secondary to fall with R hemiarthroplasty             -patient may  shower- cover surgical incision             -ELOS/Goals: 7-10 days supervision             D/c set for 5/27  Con't CIR PT and OT  Pt wants private transport to get home because has 8 STE- also wants a w/c= per PT saying he needs it.  2.  Antithrombotics: -DVT/anticoagulation:  Pharmaceutical: Xarelto              -antiplatelet therapy: N/A 3.Recent L4 endplate Fx/ Pain Management after hip fx: On Cymbalta . Continue  hydrocodone  prn.    5/16- not taking Norco right now- and still confused last night- wife has been refusing Tylenol  500 mg scheduled just taking 650 mg prn             --LSO ordered for support  5/17 hydrocodone  dose adjusted.  Patient was getting 5 mg dose for moderate pain.  For severe pain will adjust to 1 tab 7.5 and monitor and try this but if not controlled could consider adjust to 10 mg dose.  5/19- pt reports pain doing well- better- so doesn't want change ot meds at this time  5/20 -5/21-  pain doing a little better- con't regimen 4. Mood/Behavior/Sleep: LCSW to follow for evaluation and support.              -  antipsychotic agents: N/A 5. Neuropsych/cognition: This patient is capable of making decisions on his own behalf. 6. Skin/Wound Care: Routine pressure relief measures.  7. Fluids/Electrolytes/Nutrition: Monitor I/O. Check CMET in am              --encourage intake  8. Pre-renal azotemia: BUN/SCr 18/0.85 at admission-->26/0.91-->18/0.82  5/16- BUN 13 and Cr 0.72- doing better             --encourage fluid intake  -Recheck tomorrow  5/19- 16 and 0.82- doing better-  9. Acute blood loss anemia: Monitor for signs of bleeding. Recheck CBC in am             --Hgb down from 14.0-->10.1 likely due to post op loss.   5/16- Hb up to 10.9 today  5/19- Hb 11.0  Recheck tomrrow 10. Recurrent hypokalemia: Improved after supplement. Recheck in am.  11. Constipation: Sorbitol  today. Increase Senna S to 2 tabs bid.    5/16- no results- ordered soap suds enema to occur as soon as possible and not interfere with therapy.   5/17 multiple BM last night, continue to monitor to prevent recurrence of constipation  5/18 LBM yesterday  5/19- LBM 2 days ago when had multiple BM's  5/21- LBM yesterday 12. Diet controlled DM: Continue CM/make heart healthy diet.  Fair control, continue to monitor   5/19- CBGs overall controlled, but spiked yesterday at noon- will monitor-   5/20- continues to spike at noon- likely Ensure- will monitor  5/21- doing better CBG (last 3)  Recent Labs    01/27/24 1212 01/27/24 1632  GLUCAP 193* 148*    13. CAD/SSS/A fib: Monitor HR TID. On Xarelto . Heart healthy restrictions added per requrest. 14. HTN/Orthostatic hypotension: Monitor BP TID-on lisinopril  and amlodipine .   5/16- BP elevated to 160s this AM- but normally 130s-140s- con't to monitor trend  5/17 BP intermittently elevated although has had some issues with orthostatic hypotension., abd  binder/TEDS for [facility]  5/18 -5/19- BP stable this morning, continue to monitor trend  5/20- had dizzy episode this AM, where sounds like BP dropped- didn't have syncope, etc, but was c/o dizziness- will monitor    01/30/2024    5:00 AM 01/29/2024    7:43 PM 01/29/2024    2:38 PM  Vitals with BMI  Systolic 134 119 161  Diastolic 55 58 60  Pulse 71 68 66    15. Hx BPH: Toilet every 4 hours while awake.  16. Confusion overnight and urinary frequency/urgency  5/16- will check U/A and Cx  UTI treatment started, confusion appears to be improved 17. UTI  -5/17 Start keflex , monitor cultures  5/19- based on Cx's that came back, Keflex  should be appropriate - sensitive to both Klebsiella pneumo; and Proteus. 5/21- will check with pharmacy to make sure is on correct ABX and also to make sure isn't on anything that would confuse pt.   18. Poor appetite-  5/20- will order Ensure 2x/day between meals 19. R foot pain  5/20- mild swelling on dorsum of foot- that's TTP- will monitor- could be swelling from R hip working down?  5/21- less pain this AM   I spent a total of 39   minutes on total care today- >50% coordination of care- due to  Called pharmacy- to see if there's any meds causing confusion overnight- probably multifactorial since 89 years old and has increased risk of confusion being out of home. D/w Pharmacy yielded that could be retaining urine- will check bladder  scans and go form there- for 1 day.  LOS: 6 days A FACE TO FACE EVALUATION WAS PERFORMED  Roslyn Else 01/30/2024, 8:03 AM

## 2024-01-30 NOTE — Progress Notes (Signed)
 Physical Therapy Session Note  Patient Details  Name: Sean Lane MRN: [id number] Date of Birth: 1928/04/29  Today's Date: 01/30/2024 PT Individual Time: 1015-1108 and 1305-1400 PT Individual Time Calculation (min): 53 min and 55 min  Short Term Goals: Week 1:  PT Short Term Goal 1 (Week 1): =LTGs d/t ELOS  Skilled Therapeutic Interventions/Progress Updates: Pt presented in bed with wife present agreeable to therapy. Pt states pain more controlled this am than previous years. PTA donned TED hose total A and then initiated supine therex for RLE strengthening and ROM. Discussed through activities maintaining breathing and working through pain (to extent that can be tolerated). PTA instructed pt in use of gait belt for AA vs use of therapist except with SLR which required increased assistance. Pt then completed supine to sit with minA and use of bed features. From elevated bed pt performed Sit to stand with CGA and completed clothing management with CGA. Pt was able to take x 4 lateral steps to [facility] for improved positioning. Pt then returned to sitting from supine with minA for RLE management. Pt left in bed at end of session with bed alarm on, call bell within reach and current needs met.   QS 3 sec hold AA hip abd/add 2 x 15 AA heel slides 2 x 15 SAQ 2 x 10 AA SLR x 5 due to pain   Tx2: Pt presented in bed with wife present agreeable to therapy. Pt states unrated pain in RLE, rest and repositioning provided during session as needed. Pt completed bed mobility with minA fro RLE management. Pt required minA for donning LSO. PTA set up w/c and pt completed Sit to stand from w/c level and stand step transfer to w/c with CGA overall. Pt then propelled to elevators for general conditioning with supervision. Pt transported remaining distance to [facility] entrance and patio. Pt then participated in LAQ 2 x 10 AROM and Sit to stand x 3 CGA overall. Pt transported back unit and participated in Cybex Kinetron  90cm/sec x 2 min then additional x 2 min with emphasis on full extension with RLE for increased hamstring/glute recruitment. Pt transported back to room and ambulated ~34ft with CGA to bed. Completed sit to supine with minA and pt remained in bed at end of session with bed alarm on, call bell within reach and needs met.        Therapy Documentation Precautions:  Precautions Precautions: Fall Required Braces or Orthoses: Spinal Brace Spinal Brace: Lumbar corset (OOB) Restrictions Weight Bearing Restrictions Per Provider Order: Yes RLE Weight Bearing Per Provider Order: Weight bearing as tolerated General:   Vital Signs: Therapy Vitals Temp: 98.4 F (36.9 C) Temp Source: Oral Pulse Rate: 72 Resp: 20 BP: (!) 120/53 Patient Position (if appropriate): Lying Oxygen Therapy SpO2: 98 % O2 Device: Room Air Pain:    Therapy/Group: Individual Therapy  Roniesha Hollingshead 01/30/2024, 4:24 PM

## 2024-01-30 NOTE — Patient Care Conference (Signed)
 Inpatient RehabilitationTeam Conference and Plan of Care Update Date: 01/29/2024   Time: 1120 am    Patient Name: Sean Lane      Medical Record Number: [id number]  Date of Birth: September 24, 1927 Sex: Male         Room/Bed: 4M09C/4M09C-01 Payor Info: Payor: MEDICARE / Plan: MEDICARE PART A AND B / Product Type: *No Product type* /    Admit Date/Time:  01/24/2024  4:21 PM  Primary Diagnosis:  Fracture, proximal femur, right, sequela  Hospital Problems: Principal Problem:   Fracture, proximal femur, right, sequela    Expected Discharge Date: Expected Discharge Date: 02/05/24  Team Members Present: Physician leading conference: Dr. Celia Coles Social Worker Present: Other (comment) Forrestine Ike, RN) Nurse Present: Jerene Monks, RN PT Present: Aundria Leech, PT OT Present: Henrene Locust, OT;Jackqueline Mason, COTA PPS Coordinator present : Jestine Moron, SLP     Current Status/Progress Goal Weekly Team Focus  Bowel/Bladder   continent of b/b; LBM: 5/20   gain regular bowel pattern   assist with toileting needs prn    Swallow/Nutrition/ Hydration               ADL's   UB bathing/dressing-min A; LB bating/dressing-max A; transfers-min A   bathing-supervision; dressing and toileting-mod I   BADLs, transfers, educaiton, activity tolerance    Mobility   min a to CGA for mobility and stairs, limited gait d/t pain, supervision w/c. limited by fatigue and endurance/activity tolerance, and pain   mod I to supervision  pain management, activity tolerance, stairs    Communication                Safety/Cognition/ Behavioral Observations               Pain   c/o pain to back and RLE; prn Norco   pain level <4/10   assess pain QS and prn    Skin   incision to R hip, surgical dressing in place   remain free of new skin breakdown/infection  assess skin QS and prn      Discharge Planning:  D/C home with wife who is blind in left eye and does not drive.  8ste bil rails in front entrance, 3 ste garage but 13 steps to main living area.    Team Discussion: Patient was admitted post fall with right hemiarthroplasty. Patient has UTI/ right foot pain/ poor appetite/ hypotension: medication adjusted by MD. Patient limited by confusion, fatigue/ endurance / activity tolerance, cognition deficits and weight bearing restrictions.   Patient on target to meet rehab goals: yes, Patient requies minimal assistance with ADLs. Patient requires minimal assistance with transfer. Patient requires minimal assistance with mobility. Overall goals at discharge are set for supervision- mod I assistance.   *See Care Plan and progress notes for long and short-term goals.   Revisions to Treatment Plan:  Downgraded  goals  Teaching Needs: Safety, transfers, toileting, medications, weight bearing restrictions, etc   Current Barriers to Discharge: Decreased caregiver support and Weight bearing restrictions  Possible Resolutions to Barriers: Family Education Home health follow-up DME: BSC, W/C, shower seat with back     Medical Summary Current Status: Surgical dressing in place- a little confused  Barriers to Discharge: Other (comments);Behavior/Mood;Medical stability;Self-care education;Uncontrolled Pain;Weight bearing restrictions;Hypotension  Barriers to Discharge Comments: R foot pain; 8 step entry to house- intermittent ocnfusion- UTI; downgrade goals to supervision, constipation-quesitons hypoetension Possible Resolutions to [employer] Focus: Treating UTI- might need to decrease meds for BP- also ensure  for poor appetite- doesn't like GLucerna? on norco for pain- d/c- 5/27-   Continued Need for Acute Rehabilitation Level of Care: The patient requires daily medical management by a physician with specialized training in physical medicine and rehabilitation for the following reasons: Direction of a multidisciplinary physical rehabilitation program to  maximize functional independence : Yes Medical management of patient stability for increased activity during participation in an intensive rehabilitation regime.: Yes Analysis of laboratory values and/or radiology reports with any subsequent need for medication adjustment and/or medical intervention. : Yes   I attest that I was present, lead the team conference, and concur with the assessment and plan of the team.   Mellany Dinsmore Gayo 01/29/2024, 1120 am

## 2024-01-31 LAB — CBC
HCT: 33 % — ABNORMAL LOW (ref 39.0–52.0)
Hemoglobin: 10.8 g/dL — ABNORMAL LOW (ref 13.0–17.0)
MCH: 31.3 pg (ref 26.0–34.0)
MCHC: 32.7 g/dL (ref 30.0–36.0)
MCV: 95.7 fL (ref 80.0–100.0)
Platelets: 293 10*3/uL (ref 150–400)
RBC: 3.45 MIL/uL — ABNORMAL LOW (ref 4.22–5.81)
RDW: 15 % (ref 11.5–15.5)
WBC: 5.4 10*3/uL (ref 4.0–10.5)
nRBC: 0 % (ref 0.0–0.2)

## 2024-01-31 LAB — BASIC METABOLIC PANEL WITH GFR
Anion gap: 8 (ref 5–15)
BUN: 19 mg/dL (ref 8–23)
CO2: 24 mmol/L (ref 22–32)
Calcium: 9.1 mg/dL (ref 8.9–10.3)
Chloride: 103 mmol/L (ref 98–111)
Creatinine, Ser: 0.75 mg/dL (ref 0.61–1.24)
GFR, Estimated: >60 mL/min (ref >60)
Glucose, Bld: 153 mg/dL — ABNORMAL HIGH (ref 70–99)
Potassium: 3.4 mmol/L — ABNORMAL LOW (ref 3.5–5.1)
Sodium: 135 mmol/L (ref 135–145)

## 2024-01-31 MED ORDER — QUETIAPINE FUMARATE 25 MG PO TABS
25.0000 mg | ORAL_TABLET | Freq: Every day | ORAL | Status: DC
  Administered 2024-01-31: 25 mg via ORAL
  Filled 2024-01-31: qty 1

## 2024-01-31 MED ORDER — POTASSIUM CHLORIDE CRYS ER 20 MEQ PO TBCR
40.0000 meq | EXTENDED_RELEASE_TABLET | Freq: Once | ORAL | Status: AC
  Administered 2024-01-31: 40 meq via ORAL
  Filled 2024-01-31: qty 2

## 2024-01-31 NOTE — Progress Notes (Signed)
 Physical Therapy Session Note  Patient Details  Name: Sean Lane MRN: [id number] Date of Birth: [birth year]  Today's Date: 01/31/2024 PT Individual Time: 1430-1535 PT Individual Time Calculation (min): 65 min   Short Term Goals: Week 1:  PT Short Term Goal 1 (Week 1): =LTGs d/t ELOS  Skilled Therapeutic Interventions/Progress Updates:    pt received in bed and agreeable to therapy. Pt reports pain "low" at rest, but requesting pain meds before initiating mobility. Nsg provided pain meds at start of session. Pt performed log roll without cue but increased time to recall technique. Min a to prevent RLE from dropping, but otherwise CGA for bed mobility. Sit to stand from elevated bed, w/c, and commode with CGA throughout. Initial cueing for hand placement, improved carryover by end of session. Stand pivot transfer with RW and cga throughout. Pt transported to therapy gym for time management and energy conservation. Pt ambulated x 55 ft with step to pattern and RW, CGA overall. Pt performed gait  2 x 25 ft later in session. Pt performed car transfer with min a for RLE. Cueing for technique but assist only required for problem solving RLE into vehicle simulator. Pt then requested to use bathroom so returned to room. ambulatory transfer with CGA and RW, Pt able to perform clothing management and hygiene in standing with CGA. Pt washed hands in standing then returned to w/c. Performed 1 bout of stepping over small obstacle to increase RLE weight bearing for improved stair navigation. Pt returned to bed after session with CGA, was left with all needs in reach and alarm active.   Therapy Documentation Precautions:  Precautions Precautions: Fall Required Braces or Orthoses: Spinal Brace Spinal Brace: Lumbar corset (OOB) Restrictions Weight Bearing Restrictions Per Provider Order: Yes RLE Weight Bearing Per Provider Order: Weight bearing as tolerated General:       Therapy/Group: Individual  Therapy  Tex Filbert 01/31/2024, 3:08 PM

## 2024-01-31 NOTE — Progress Notes (Signed)
 Occupational Therapy Session Note  Patient Details  Name: Sean Lane MRN: [id number] Date of Birth: 1928/05/27  Today's Date: 01/31/2024 OT Individual Time:  0815-0926  Total time 71 mins      Short Term Goals: Week 2:  OT Short Term Goal 1 (Week 2): LTG=STG 2/2 ELOS  Skilled Therapeutic Interventions/Progress Updates:    Pt sleeping in bed upon arrival but easily aroused. Pt required extra time to fully awaken and reorient self to current setting. Pt's wife reported pt has always been slow to arouse after sleep. Skilled OT intervention with focus on bed mobility, sitting balance, sit<>stand, standing balance, funcitonal amb with RW, toileting, bathing seated EOB, and dressing with sit<>stand from EOB. Supine>sit EOB with min A and min verbal cues for technique. Pt sat EOB for a few minutes before standing up. No s/s OH. Amb to bathroom with CGA using RW. Toileting with min A, standing for hygiene. Pt returned to EOB for bathing/dressing. All sit<>stand from EOB with CGA. Standing balance with CGA. Pt required assistance with LB dressing without AE this morning. PT discourage because he doesn't think he is making progress. Encouraged pt and educated on current progress and challenges to be addressed in future therapy sessions. Pt returned to supine with supervision. Assist provided for repositionig. BUE therex with 3# bar reaching out and up 3x12. Pt remained in bed with all needs within rach. Wife present. Bed alarm activated.   Therapy Documentation Precautions:  Precautions Precautions: Fall Required Braces or Orthoses: Spinal Brace Spinal Brace: Lumbar corset (OOB) Restrictions Weight Bearing Restrictions Per Provider Order: Yes RLE Weight Bearing Per Provider Order: Weight bearing as tolerated Pain:  Pt reports increase Rt hip pain with activity; rest and repositioned   Therapy/Group: Individual Therapy  [name] 01/31/2024, 2:10 PM

## 2024-01-31 NOTE — Plan of Care (Signed)
  Problem: Consults Goal: RH GENERAL PATIENT EDUCATION Description: See Patient Education module for education specifics. Outcome: Progressing   Problem: RH BOWEL ELIMINATION Goal: RH STG MANAGE BOWEL WITH ASSISTANCE Description: STG Manage Bowel with supervision Assistance. Outcome: Progressing   Problem: RH BLADDER ELIMINATION Goal: RH STG MANAGE BLADDER WITH ASSISTANCE Description: STG Manage Bladder With supervision Assistance Outcome: Progressing   Problem: RH SKIN INTEGRITY Goal: RH STG SKIN FREE OF INFECTION/BREAKDOWN Description: Manage skin free of infection/breakdown with supervision assistance Outcome: Progressing   Problem: RH SAFETY Goal: RH STG ADHERE TO SAFETY PRECAUTIONS W/ASSISTANCE/DEVICE Description: STG Adhere to Safety Precautions With supervision Assistance/Device. Outcome: Progressing   Problem: RH PAIN MANAGEMENT Goal: RH STG PAIN MANAGED AT OR BELOW PT'S PAIN GOAL Description: <4 w/ prns Outcome: Progressing   Problem: RH KNOWLEDGE DEFICIT GENERAL Goal: RH STG INCREASE KNOWLEDGE OF SELF CARE AFTER HOSPITALIZATION Description: Manage knowledge deficit with supervision assistance from wife using educational materials provided Outcome: Progressing

## 2024-01-31 NOTE — Progress Notes (Signed)
 Occupational Therapy Session Note  Patient Details  Name: COLBI STAUBS MRN: [id number] Date of Birth: April 23, 1928  Today's Date: 01/31/2024 OT Group Time: 1100-1154 OT Group Time Calculation (min): 54 min   Short Term Goals: Week 1:  OT Short Term Goal 1 (Week 1): LTG=STG OT Short Term Goal 1 - Progress (Week 1): Progressing toward goal  Skilled Therapeutic Interventions/Progress Updates:      Therapy Documentation Precautions:  Precautions Precautions: Fall Required Braces or Orthoses: Spinal Brace Spinal Brace: Lumbar corset (OOB) Restrictions Weight Bearing Restrictions Per Provider Order: Yes RLE Weight Bearing Per Provider Order: Weight bearing as tolerated Group Description: UE group:OT providing skilled intervention with the following exercise circuit in order to improve functional activity, strength and endurance to prepare for ADLs.  Pt completed the following exercises in seated position with no noted LOB/SOB and 5:00 min on each exercise with PRN rest breaks: -bicep curls  -triceps extensions  -rows  -chest press     Individual level documentation: Patient completed group from sitting level with green theraband and 4# dowel rod Patient needed set up to complete various exercises with cues for demonstration. Patient able to come up with modifications during group if necessary.   Pain: 0/10   Therapy/Group: Group Therapy  Nila Barth, OTD, OTR/L 01/31/2024, 1:29 PM

## 2024-01-31 NOTE — Progress Notes (Signed)
 Occupational Therapy Weekly Progress Note  Patient Details  Name: Sean Lane MRN: [id number] Date of Birth: 1928/07/25  Beginning of progress report period: Jan 25, 2024 End of progress report period: Jan 31, 2024  Pt is making steady progress with BADLs and functional transfers since admission. Pt requires min A for supine>sit using hospital bed functions. Sit<>stand and amb with RW in room with CGA. Toileting with CGA. Bathing at shower level with min A for BLE without use of AE. LB dressing with min A for donning socks without use of AE. Pt needs occasional encouragement but is pleased with progress. Wife has been present and observed pt's progress.  Patient continues to demonstrate the following deficits: muscle weakness, decreased cardiorespiratoy endurance, and decreased standing balance, decreased postural control, and decreased balance strategies and therefore will continue to benefit from skilled OT intervention to enhance overall performance with BADL and Reduce care partner burden.  Patient progressing toward long term goals..  Continue plan of care.  OT Short Term Goals Week 1:  OT Short Term Goal 1 (Week 1): LTG=STG OT Short Term Goal 1 - Progress (Week 1): Progressing toward goal Week 2:  OT Short Term Goal 1 (Week 2): LTG=STG 2/2 ELOS  Nila Barth, OTD, OTR/L [name] 01/31/2024, 6:55 AM

## 2024-01-31 NOTE — Progress Notes (Signed)
 PROGRESS NOTE   Subjective/Complaints:  Pt reports slept slightly better last night, but still woke up 3 different times confused, not sure where he was or why-kept trying to get up per wife- and agitated about it- also distressed about it confusion- we discussed how it is not medication induced that we can see- unless it's Norco, but only occurring at night, so less likely- could be due to age/and just sundowning since not in familiar place- pt and wife are interested in meds to calm down confusion.    ROS: Negative except for HPI   Pt denies SOB, abd pain, CP, N/V/C/D, and vision changes    Objective:   No results found. Recent Labs    01/31/24 0656  WBC 5.4  HGB 10.8*  HCT 33.0*  PLT 293   Recent Labs    01/31/24 0656  NA 135  K 3.4*  CL 103  CO2 24  GLUCOSE 153*  BUN 19  CREATININE 0.75  CALCIUM  9.1    Intake/Output Summary (Last 24 hours) at 01/31/2024 0841 Last data filed at 01/30/2024 1800 Gross per 24 hour  Intake 240 ml  Output --  Net 240 ml        Physical Exam: Vital Signs Blood pressure (!) 102/59, pulse 81, temperature 98.7 F (37.1 C), temperature source Oral, resp. rate 18, height 6\' 2"  (1.88 m), weight 79.3 kg, SpO2 98%.       General: awake, alert, appropriate, just woke up- needed to put in hearing aids; wife at bedside; supine in bed; NAD HENT: conjugate gaze; oropharynx very dry- needed water  to speak CV: regular rate- irregular rhythm; no JVD Pulmonary: CTA B/L; no W/R/R- good air movement GI: soft, NT, ND, (+)BS Psychiatric: appropriate- still a little sleepy Neurological: Ox3   MSK- surgical dressing still looks good- no erythema or drainage around incision.     Cervical back: Neck supple. No tenderness.     Comments: Surgical dressing on right anterior hip. RLE limited by pain.  stable   Ue's 5-/5 LLE 5-/5 throughout LLE- HF 2/5- limited by pain- KE- "Cannot do"  due to pain; DF/PF 5-/5 EHL 4+/5  Skin:    General: Skin is warm and dry.     Comments: Anterior R hip surgical dressing moderate swelling, no surrounding erythema or drainage noted  Neurological:     Mental Status: He is alert and oriented to person, place, and time.     Comments: Intact to light touch in all 4 extremities  Assessment/Plan: 1. Functional deficits which require 3+ hours per day of interdisciplinary therapy in a comprehensive inpatient rehab setting. Physiatrist is providing close team supervision and 24 hour management of active medical problems listed below. Physiatrist and rehab team continue to assess barriers to discharge/monitor patient progress toward functional and medical goals  Care Tool:  Bathing    Body parts bathed by patient: Right arm, Left arm, Chest, Abdomen, Front perineal area, Buttocks, Right upper leg, Left upper leg, Face   Body parts bathed by helper: Buttocks, Right upper leg, Left upper leg Body parts n/a: Right lower leg, Left lower leg   Bathing assist Assist Level: Minimal Assistance - Patient >  75%     Upper Body Dressing/Undressing Upper body dressing   What is the patient wearing?: Pull over shirt    Upper body assist Assist Level: Supervision/Verbal cueing    Lower Body Dressing/Undressing Lower body dressing      What is the patient wearing?: Pants, Underwear/pull up     Lower body assist Assist for lower body dressing: Minimal Assistance - Patient > 75%     Toileting Toileting    Toileting assist Assist for toileting: Moderate Assistance - Patient 50 - 74%     Transfers Chair/bed transfer  Transfers assist     Chair/bed transfer assist level: Contact Guard/Touching assist     Locomotion Ambulation   Ambulation assist   Ambulation activity did not occur: Safety/medical concerns          Walk 10 feet activity   Assist  Walk 10 feet activity did not occur: Safety/medical concerns        Walk 50  feet activity   Assist Walk 50 feet with 2 turns activity did not occur: Safety/medical concerns         Walk 150 feet activity   Assist Walk 150 feet activity did not occur: Safety/medical concerns         Walk 10 feet on uneven surface  activity   Assist Walk 10 feet on uneven surfaces activity did not occur: Safety/medical concerns         Wheelchair     Assist Is the patient using a wheelchair?: Yes Type of Wheelchair: Manual Wheelchair activity did not occur: Safety/medical concerns  Wheelchair assist level: Supervision/Verbal cueing Max wheelchair distance: 150    Wheelchair 50 feet with 2 turns activity    Assist    Wheelchair 50 feet with 2 turns activity did not occur: Safety/medical concerns   Assist Level: Supervision/Verbal cueing   Wheelchair 150 feet activity     Assist  Wheelchair 150 feet activity did not occur: Safety/medical concerns   Assist Level: Supervision/Verbal cueing   Blood pressure (!) 102/59, pulse 81, temperature 98.7 F (37.1 C), temperature source Oral, resp. rate 18, height 6\' 2"  (1.88 m), weight 79.3 kg, SpO2 98%.  Medical Problem List and Plan: 1. Functional deficits secondary to R femoral neck fx secondary to fall with R hemiarthroplasty             -patient may  shower- cover surgical incision             -ELOS/Goals: 7-10 days supervision             D/c set for 5/27  Con't CIR PT and OT- pt and wife are concerned won't be ready next Tuesday- let them know if he's not ready by Monday will reassess, but gains are going rapidly- is up to CGA- min A for most of ADL's Mobility  Pt wants private transport to get home because has 8 STE- also wants a w/c= per PT saying he needs it.  2.  Antithrombotics: -DVT/anticoagulation:  Pharmaceutical: Xarelto              -antiplatelet therapy: N/A 3.Recent L4 endplate Fx/ Pain Management after hip fx: On Cymbalta . Continue  hydrocodone  prn.    5/16- not taking Norco  right now- and still confused last night- wife has been refusing Tylenol  500 mg scheduled just taking 650 mg prn             --LSO ordered for support  5/17 hydrocodone  dose adjusted.  Patient was  getting 5 mg dose for moderate pain.  For severe pain will adjust to 1 tab 7.5 and monitor and try this but if not controlled could consider adjust to 10 mg dose.  5/19- pt reports pain doing well- better- so doesn't want change ot meds at this time  5/20 -5/21- pain doing a little better- con't regimen 4. Mood/Behavior/Sleep: LCSW to follow for evaluation and support.              -antipsychotic agents: N/A 5. Neuropsych/cognition: This patient is capable of making decisions on his own behalf. 6. Skin/Wound Care: Routine pressure relief measures.  7. Fluids/Electrolytes/Nutrition: Monitor I/O. Check CMET in am              --encourage intake  8. Pre-renal azotemia: BUN/SCr 18/0.85 at admission-->26/0.91-->18/0.82  5/16- BUN 13 and Cr 0.72- doing better             --encourage fluid intake  -Recheck tomorrow  5/19- 16 and 0.82- doing better-  9. Acute blood loss anemia: Monitor for signs of bleeding. Recheck CBC in am             --Hgb down from 14.0-->10.1 likely due to post op loss.   5/16- Hb up to 10.9 today  5/19- Hb 11.0  5/22- Hb 10.8 10. Recurrent hypokalemia: Improved after supplement. Recheck in am.   5/22- K+ 3.4- will replete 40 mEq x1- and recheck Monday 11. Constipation: Sorbitol  today. Increase Senna S to 2 tabs bid.    5/16- no results- ordered soap suds enema to occur as soon as possible and not interfere with therapy.   5/17 multiple BM last night, continue to monitor to prevent recurrence of constipation  5/18 LBM yesterday  5/19- LBM 2 days ago when had multiple BM's  5/21- LBM yesterday  5/22- LBM yesterday 12. Diet controlled DM: Continue CM/make heart healthy diet.  Fair control, continue to monitor   5/19- CBGs overall controlled, but spiked yesterday at noon- will  monitor-   5/20- continues to spike at noon- likely Ensure- will monitor  5/22- stopped checking BG's since not on meds CBG (last 3)  No results for input(s): "GLUCAP" in the last 72 hours.   13. CAD/SSS/A fib: Monitor HR TID. On Xarelto . Heart healthy restrictions added per requrest. 14. HTN/Orthostatic hypotension: Monitor BP TID-on lisinopril  and amlodipine .   5/16- BP elevated to 160s this AM- but normally 130s-140s- con't to monitor trend  5/17 BP intermittently elevated although has had some issues with orthostatic hypotension., abd binder/TEDS for [facility]  5/18 -5/19- BP stable this morning, continue to monitor trend  5/20- had dizzy episode this AM, where sounds like BP dropped- didn't have syncope, etc, but was c/o dizziness- will monitor  5/22- Pt's BP 102/59 this AM- will monitor for orthostasis    01/31/2024    5:58 AM 01/30/2024    8:05 PM 01/30/2024    3:08 PM  Vitals with BMI  Systolic 102 121 454  Diastolic 59 59 53  Pulse 81 89 72    15. Hx BPH: Toilet every 4 hours while awake.  16. Confusion overnight and urinary frequency/urgency  5/16- will check U/A and Cx  UTI treatment started, confusion appears to be improved 17. UTI- Proteus/Klebsiella Pneumo  -5/17 Start keflex , monitor cultures  5/19- based on Cx's that came back, Keflex  should be appropriate - sensitive to both Klebsiella pneumo; and Proteus. 5/21- will check with pharmacy to make sure is on correct ABX and also  to make sure isn't on anything that would confuse pt.   5/22- Spoke with pharmacy- don't see reason meds or UTI could be causing confusion- on correct ABX- did maintain for 2 more days.  18. Poor appetite-  5/20- will order Ensure 2x/day between meals 19. R foot pain  5/20- mild swelling on dorsum of foot- that's TTP- will monitor- could be swelling from R hip working down?  5/21- less pain this AM  5/22- no complaints of pain this AM 20. Sundowning  5/22- no reason from meds to be causing  sundowning- will start Seroquel 25 mg at bedtime since causes so much agitation in pt and distress.   I spent a total of 43   minutes on total care today- >50% coordination of care- due to  D/w pt and wife as well as nursing about sundowning- also reviewed vitals and BP's esp  .  LOS: 7 days A FACE TO FACE EVALUATION WAS PERFORMED  Yaret Hush 01/31/2024, 8:41 AM

## 2024-02-01 MED ORDER — QUETIAPINE FUMARATE 50 MG PO TABS
50.0000 mg | ORAL_TABLET | Freq: Every day | ORAL | Status: DC
  Administered 2024-02-01 – 2024-02-04 (×4): 50 mg via ORAL
  Filled 2024-02-01 (×4): qty 1

## 2024-02-01 NOTE — Plan of Care (Signed)
  Problem: Consults Goal: RH GENERAL PATIENT EDUCATION Description: See Patient Education module for education specifics. Outcome: Progressing   Problem: RH BOWEL ELIMINATION Goal: RH STG MANAGE BOWEL WITH ASSISTANCE Description: STG Manage Bowel with supervision Assistance. Outcome: Progressing   Problem: RH BLADDER ELIMINATION Goal: RH STG MANAGE BLADDER WITH ASSISTANCE Description: STG Manage Bladder With supervision Assistance Outcome: Progressing   Problem: RH SKIN INTEGRITY Goal: RH STG SKIN FREE OF INFECTION/BREAKDOWN Description: Manage skin free of infection/breakdown with supervision assistance Outcome: Progressing   Problem: RH SAFETY Goal: RH STG ADHERE TO SAFETY PRECAUTIONS W/ASSISTANCE/DEVICE Description: STG Adhere to Safety Precautions With supervision Assistance/Device. Outcome: Progressing   Problem: RH PAIN MANAGEMENT Goal: RH STG PAIN MANAGED AT OR BELOW PT'S PAIN GOAL Description: <4 w/ prns Outcome: Progressing   Problem: RH KNOWLEDGE DEFICIT GENERAL Goal: RH STG INCREASE KNOWLEDGE OF SELF CARE AFTER HOSPITALIZATION Description: Manage knowledge deficit with supervision assistance from wife using educational materials provided Outcome: Progressing

## 2024-02-01 NOTE — Plan of Care (Signed)
  Problem: Consults Goal: RH GENERAL PATIENT EDUCATION Description: See Patient Education module for education specifics. Outcome: Progressing   Problem: RH BOWEL ELIMINATION Goal: RH STG MANAGE BOWEL WITH ASSISTANCE Description: STG Manage Bowel with supervision Assistance. Outcome: Progressing Flowsheets (Taken 02/01/2024 1646) STG: Pt will manage bowels with assistance: 5-Supervision/curing   Problem: RH BLADDER ELIMINATION Goal: RH STG MANAGE BLADDER WITH ASSISTANCE Description: STG Manage Bladder With supervision Assistance Outcome: Progressing Flowsheets (Taken 02/01/2024 1646) STG: Pt will manage bladder with assistance: 5-Supervision/cueing   Problem: RH SKIN INTEGRITY Goal: RH STG SKIN FREE OF INFECTION/BREAKDOWN Description: Manage skin free of infection/breakdown with supervision assistance Outcome: Progressing   Problem: RH PAIN MANAGEMENT Goal: RH STG PAIN MANAGED AT OR BELOW PT'S PAIN GOAL Description: <4 w/ prns Outcome: Progressing

## 2024-02-01 NOTE — Progress Notes (Signed)
 Physical Therapy Session Note  Patient Details  Name: Sean Lane MRN: [id number] Date of Birth: [birth year]  Today's Date: 02/01/2024 PT Individual Time: 1415-1530 PT Individual Time Calculation (min): 75 min   Short Term Goals: Week 1:  PT Short Term Goal 1 (Week 1): =LTGs d/t ELOS  Skilled Therapeutic Interventions/Progress Updates: Pt presented in bed with wife present agreeable to therapy. Pt states mild unrated pain at start of session. Per wife premedicated. Pt completed supine to sit with CGA and use of bed features with increased time. Pt demonstrated no s/s of OH upon sitting. Completed ambulatory transfer to w/c with CGA from elevated bed overall. Pt transported to main gym for energy conservation and participated in varying activities for weight acceptance on RLE. Pt initially performed toe taps to target x 10 alternating then progressed to toe taps to 2in step with emphasis on decreased BUE weight bearing through RW. Pt then progressed to same activity with 4in step with pt performed bouts of 5 on single LE to increase wt bearing acceptance on RLE. Pt did require intermittent rest breaks between bouts. Pt then ambulated to stairs and participated in ascending/descending x 8 3in steps with CGA. Pt did require cues to sequencing appropriately but noted to use LLE more vs pulling with BUE. After seated rest break pt moved over to 6in steps and was able to complete x 4 steps with CGA and step to pattern. Pt then ambulated ~24ft with RW and CGA. Pt encouraged to work on step through vs step to pattern which pt was able to complete intermittently. Pt transported back to room and completed ambulatory transfer to bed. Completed sit to supine with minA and was able to reposition to comfort. Pt left in bed at end of session with call bell within reach and needs met.     Therapy Documentation Precautions:  Precautions Precautions: Fall Required Braces or Orthoses: Spinal Brace Spinal  Brace: Lumbar corset (OOB) Restrictions Weight Bearing Restrictions Per Provider Order: Yes RLE Weight Bearing Per Provider Order: Weight bearing as tolerated General:   Vital Signs:   Pain: Pain Assessment Pain Scale: 0-10 Pain Score: 0-No pain   Therapy/Group: Individual Therapy  [name] 02/01/2024, 4:44 PM

## 2024-02-01 NOTE — Progress Notes (Signed)
 Physical Therapy Session Note  Patient Details  Name: SAVYON LOKEN MRN: [id number] Date of Birth: 03-02-1928  Today's Date: 02/01/2024 PT Individual Time: 1030-1130 PT Individual Time Calculation (min): 60 min   Short Term Goals: Week 1:  PT Short Term Goal 1 (Week 1): =LTGs d/t ELOS  Skilled Therapeutic Interventions/Progress Updates:    pt received in bed and agreeable to therapy. Pt reports higher pain levels than previous date, up to 9/10 in hip with mobility. Declined medication, so provided rest and positioning as able.   Pt drowsy on arrival, but improved with several minutes sitting upright. Bed mobility with CGA and VC for technique. Extended time sitting EOB to increase arousal and for BP management. Although teds in places, sitting BP=101/57 (65). Added binder and BP rose to 124/62 (81), with noted improved affect/coloring. Pt transported to [facility] entrance for effect of outdoor environment on mood and affect. Therapist provided active listening and psychosocial support in regard to pt's feeling of hopelessness with his condition. Pt ambulated x 3 bouts of 30-50 ft, limited by pain and fatigue. Pt then propelled w/c ~200 ft for endurance and functional mobility. Transported remaining distance d/t fatigue. Pt remained in w/c with plan to stay up for lunch.   Therapy Documentation Precautions:  Precautions Precautions: Fall Required Braces or Orthoses: Spinal Brace Spinal Brace: Lumbar corset (OOB) Restrictions Weight Bearing Restrictions Per Provider Order: Yes RLE Weight Bearing Per Provider Order: Weight bearing as tolerated General:       Therapy/Group: Individual Therapy  Tex Filbert 02/01/2024, 11:38 AM

## 2024-02-01 NOTE — Progress Notes (Signed)
 PROGRESS NOTE   Subjective/Complaints:  Pt and wife reports pt was extremely confused at 1am last night- got belligerent and was cussing at his wife trying to get OOB- raising his voice as well, was so confused. It lasted 1.5 hours before went back to sleep. But only occurred once, vs 3x the other nights.  Pt reports he's hurting on back and hip and bothering him- hasn't gotten meds yet this AM.   Had 2 Bms yesterday.  Wife also reports dressing taken 2/3 off, but not completely removed.    ROS: Negative except for HPI   Pt denies SOB, abd pain, CP, N/V/C/D, and vision changes    Objective:   No results found. Recent Labs    01/31/24 0656  WBC 5.4  HGB 10.8*  HCT 33.0*  PLT 293   Recent Labs    01/31/24 0656  NA 135  K 3.4*  CL 103  CO2 24  GLUCOSE 153*  BUN 19  CREATININE 0.75  CALCIUM  9.1    Intake/Output Summary (Last 24 hours) at 02/01/2024 1610 Last data filed at 02/01/2024 0118 Gross per 24 hour  Intake 200 ml  Output 275 ml  Net -75 ml        Physical Exam: Vital Signs Blood pressure 132/62, pulse 74, temperature 98.5 F (36.9 C), temperature source Oral, resp. rate 18, height 6\' 2"  (1.88 m), weight 77.5 kg, SpO2 98%.        General: awake, alert, appropriate,  but woke him up- still sleepy- HOH- not wearing Hearing aids. NAD HENT: conjugate gaze; oropharynx extremely dry- gave water  CV: regular rate and rhythm; no JVD Pulmonary: CTA B/L; no W/R/R- good air movement GI: soft, NT, ND, (+)BS; normoactive Psychiatric: appropriate but sleepy Neurological: Ox3 this AM    MSK-Surgical dressing 2/3 off- looks OK so far    Cervical back: Neck supple. No tenderness.     Comments: Surgical dressing on right anterior hip. RLE limited by pain.  stable   Ue's 5-/5 LLE 5-/5 throughout LLE- HF 2/5- limited by pain- KE- "Cannot do" due to pain; DF/PF 5-/5 EHL 4+/5  Skin:    General: Skin  is warm and dry.     Comments: Anterior R hip surgical dressing moderate swelling, no surrounding erythema or drainage noted  Neurological:     Mental Status: He is alert and oriented to person, place, and time.     Comments: Intact to light touch in all 4 extremities  Assessment/Plan: 1. Functional deficits which require 3+ hours per day of interdisciplinary therapy in a comprehensive inpatient rehab setting. Physiatrist is providing close team supervision and 24 hour management of active medical problems listed below. Physiatrist and rehab team continue to assess barriers to discharge/monitor patient progress toward functional and medical goals  Care Tool:  Bathing    Body parts bathed by patient: Right arm, Left arm, Chest, Abdomen, Front perineal area, Buttocks, Right upper leg, Left upper leg, Face   Body parts bathed by helper: Buttocks, Right upper leg, Left upper leg Body parts n/a: Right lower leg, Left lower leg   Bathing assist Assist Level: Minimal Assistance - Patient > 75%  Upper Body Dressing/Undressing Upper body dressing   What is the patient wearing?: Pull over shirt    Upper body assist Assist Level: Supervision/Verbal cueing    Lower Body Dressing/Undressing Lower body dressing      What is the patient wearing?: Pants, Underwear/pull up     Lower body assist Assist for lower body dressing: Minimal Assistance - Patient > 75%     Toileting Toileting    Toileting assist Assist for toileting: Minimal Assistance - Patient > 75%     Transfers Chair/bed transfer  Transfers assist     Chair/bed transfer assist level: Contact Guard/Touching assist     Locomotion Ambulation   Ambulation assist   Ambulation activity did not occur: Safety/medical concerns  Assist level: Contact Guard/Touching assist Assistive device: Walker-rolling Max distance: 55   Walk 10 feet activity   Assist  Walk 10 feet activity did not occur: Safety/medical  concerns  Assist level: Contact Guard/Touching assist Assistive device: Walker-rolling   Walk 50 feet activity   Assist Walk 50 feet with 2 turns activity did not occur: Safety/medical concerns  Assist level: Contact Guard/Touching assist Assistive device: Walker-rolling    Walk 150 feet activity   Assist Walk 150 feet activity did not occur: Safety/medical concerns         Walk 10 feet on uneven surface  activity   Assist Walk 10 feet on uneven surfaces activity did not occur: Safety/medical concerns         Wheelchair     Assist Is the patient using a wheelchair?: Yes Type of Wheelchair: Manual Wheelchair activity did not occur: Safety/medical concerns  Wheelchair assist level: Supervision/Verbal cueing Max wheelchair distance: 150    Wheelchair 50 feet with 2 turns activity    Assist    Wheelchair 50 feet with 2 turns activity did not occur: Safety/medical concerns   Assist Level: Supervision/Verbal cueing   Wheelchair 150 feet activity     Assist  Wheelchair 150 feet activity did not occur: Safety/medical concerns   Assist Level: Supervision/Verbal cueing   Blood pressure 132/62, pulse 74, temperature 98.5 F (36.9 C), temperature source Oral, resp. rate 18, height 6\' 2"  (1.88 m), weight 77.5 kg, SpO2 98%.  Medical Problem List and Plan: 1. Functional deficits secondary to R femoral neck fx secondary to fall with R hemiarthroplasty             -patient may  shower- cover surgical incision             -ELOS/Goals: 7-10 days supervision             D/c set for 5/27  Con't CIR PT and OT- reassured wife that except for confusion at night, pt is doing great and progressing as anticipated - pt and wife are concerned won't be ready next Tuesday- let them know if he's not ready by Monday will reassess, but gains are going rapidly- is up to CGA- min A for most of ADL's Mobility  Pt wants private transport to get home because has 8 STE- also  wants a w/c= per PT saying he needs it.  2.  Antithrombotics: -DVT/anticoagulation:  Pharmaceutical: Xarelto              -antiplatelet therapy: N/A 3.Recent L4 endplate Fx/ Pain Management after hip fx: On Cymbalta . Continue  hydrocodone  prn.    5/16- not taking Norco right now- and still confused last night- wife has been refusing Tylenol  500 mg scheduled just taking 650 mg prn             --  LSO ordered for support  5/17 hydrocodone  dose adjusted.  Patient was getting 5 mg dose for moderate pain.  For severe pain will adjust to 1 tab 7.5 and monitor and try this but if not controlled could consider adjust to 10 mg dose.  5/19- pt reports pain doing well- better- so doesn't want change ot meds at this time  5/20 -5/21- pain doing a little better- con't regimen  5/23- having more pain this AM, but hasn't had Norco since 5/22 at 2pm, so not the cause of confusion overnight 4. Mood/Behavior/Sleep: LCSW to follow for evaluation and support.              -antipsychotic agents: N/A 5. Neuropsych/cognition: This patient is capable of making decisions on his own behalf during day- sundowning at night. 6. Skin/Wound Care: Routine pressure relief measures.  7. Fluids/Electrolytes/Nutrition: Monitor I/O. Check CMET in am              --encourage intake  8. Pre-renal azotemia: BUN/SCr 18/0.85 at admission-->26/0.91-->18/0.82  5/16- BUN 13 and Cr 0.72- doing better             --encourage fluid intake  -Recheck tomorrow  5/19- 16 and 0.82- doing better-  9. Acute blood loss anemia: Monitor for signs of bleeding. Recheck CBC in am             --Hgb down from 14.0-->10.1 likely due to post op loss.   5/16- Hb up to 10.9 today  5/19- Hb 11.0  5/22- Hb 10.8 10. Recurrent hypokalemia: Improved after supplement. Recheck in am.   5/22- K+ 3.4- will replete 40 mEq x1- and recheck Monday 11. Constipation: Sorbitol  today. Increase Senna S to 2 tabs bid.    5/16- no results- ordered soap suds enema to occur as  soon as possible and not interfere with therapy.   5/23- LBM x2 yesterday 12. Diet controlled DM: Continue CM/make heart healthy diet.  Fair control, continue to monitor   5/19- CBGs overall controlled, but spiked yesterday at noon- will monitor-   5/20- continues to spike at noon- likely Ensure- will monitor  5/22- stopped checking BG's since not on meds CBG (last 3)  No results for input(s): "GLUCAP" in the last 72 hours.   13. CAD/SSS/A fib: Monitor HR TID. On Xarelto . Heart healthy restrictions added per requrest. 14. HTN/Orthostatic hypotension: Monitor BP TID-on lisinopril  and amlodipine .   5/16- BP elevated to 160s this AM- but normally 130s-140s- con't to monitor trend  5/17 BP intermittently elevated although has had some issues with orthostatic hypotension., abd binder/TEDS for [facility]  5/18 -5/19- BP stable this morning, continue to monitor trend  5/20- had dizzy episode this AM, where sounds like BP dropped- didn't have syncope, etc, but was c/o dizziness- will monitor  5/22- Pt's BP 102/59 this AM- will monitor for orthostasis  5/23- BP doing better- 120s-130s' systolic-con't regimen    02/01/2024    6:03 AM 01/31/2024    7:47 PM 01/31/2024    2:43 PM  Vitals with BMI  Weight   170 lbs 14 oz  BMI   21.93  Systolic 132 130   Diastolic 62 56   Pulse 74 71     15. Hx BPH: Toilet every 4 hours while awake.  16. Confusion overnight and urinary frequency/urgency  5/16- will check U/A and Cx  UTI treatment started, confusion appears to be improved 17. UTI- Proteus/Klebsiella Pneumo  -5/17 Start keflex , monitor cultures  5/19- based on Cx's  that came back, Keflex  should be appropriate - sensitive to both Klebsiella pneumo; and Proteus. 5/21- will check with pharmacy to make sure is on correct ABX and also to make sure isn't on anything that would confuse pt.   5/22- Spoke with pharmacy- don't see reason meds or UTI could be causing confusion- on correct ABX- did maintain for 2  more days.  18. Poor appetite-  5/20- will order Ensure 2x/day between meals 19. R foot pain  5/20- mild swelling on dorsum of foot- that's TTP- will monitor- could be swelling from R hip working down?  5/21- less pain this AM  5/22- no complaints of pain this AM 20. Sundowning  5/22- no reason from meds to be causing sundowning- will start Seroquel 25 mg at bedtime since causes so much agitation in pt and distress.   5/23- 1 bad episode last night- will increase Seroquel to 50 mg at bedtime- if Keflex  is adding to confusion (which doesn't make sense since pt having clear sensorium during day), then it will be d/c'd today.   I spent a total of  41  minutes on total care today- >50% coordination of care- due to  D/w nursing x2- and at length with pt/wife about Confusion and agitation overnight-  .  LOS: 8 days A FACE TO FACE EVALUATION WAS PERFORMED  Ervin Hensley 02/01/2024, 8:08 AM

## 2024-02-01 NOTE — Progress Notes (Signed)
 Occupational Therapy Session Note  Patient Details  Name: Sean Lane MRN: [id number] Date of Birth: [birth year]  Today's Date: 02/01/2024 OT Individual Time: 4098-1191 OT Individual Time Calculation (min): 68 min    Short Term Goals: Week 2:  OT Short Term Goal 1 (Week 2): LTG=STG 2/2 ELOS  Skilled Therapeutic Interventions/Progress Up." dates:    Pt sleeping upon arrival but easily aroused. Pt slow to orient and "gather his wits." Pt reports he had a "rough" night. Wife confirms. Skilled OT intervention with focus on bed mobility, sitting balance, sit<>stand, standing balance, functional amb with RW, toileting, dressing with sit<>stand from EOB, and activity tolerance to increase independence with BADLs. Supine>sit EOB with min A to manage RLE off EOB. Sitting balance with supervision. Extra time allowed sitting EOB before amb to bathroom to use toilet. Pt denies dizziness. Sit<>stand and amb with RW to bathroom with CGA. Toileting with min A. Pt returned to EOB to complete dressing tasks. Pt required increased time between BADL segments this morning. OTA demonstrated walk in shower transfer but pt unable to return demonstrate. Recommended that pt have HHOT practice shower transfers in home envirionment. Pt continued to express that he just feels "out of it." Pt used reacher to assist with threading BLE into pants. Min verbal cues througout session for technique in sit<>stand. After meds admin, pt reported feeling "woozy." Sit>supine with mod A for BLE mgmt. BP sitting110/54, supine 133/58. Pt remained in bed with all needs within reach. Bed alarm activated. Wife present.   Therapy Documentation Precautions:  Precautions Precautions: Fall Required Braces or Orthoses: Spinal Brace Spinal Brace: Lumbar corset (OOB) Restrictions Weight Bearing Restrictions Per Provider Order: Yes RLE Weight Bearing Per Provider Order: Weight bearing as tolerated Pain:  Pt c/o increased RLE/hip with  transitional movements and with increased WB; meds admin during session and repositioned   Therapy/Group: Individual Therapy  [name] 02/01/2024, 9:42 AM

## 2024-02-02 MED ORDER — LOPERAMIDE HCL 2 MG PO CAPS
2.0000 mg | ORAL_CAPSULE | ORAL | Status: DC | PRN
  Administered 2024-02-02: 2 mg via ORAL
  Filled 2024-02-02: qty 1

## 2024-02-02 MED ORDER — MELATONIN 5 MG PO TABS
5.0000 mg | ORAL_TABLET | Freq: Every day | ORAL | Status: DC
  Administered 2024-02-02 – 2024-02-04 (×3): 5 mg via ORAL
  Filled 2024-02-02 (×3): qty 1

## 2024-02-02 MED ORDER — VITAMIN D 25 MCG (1000 UNIT) PO TABS
1000.0000 [IU] | ORAL_TABLET | Freq: Every day | ORAL | Status: DC
  Administered 2024-02-02 – 2024-02-05 (×4): 1000 [IU] via ORAL
  Filled 2024-02-02 (×5): qty 1

## 2024-02-02 NOTE — Progress Notes (Signed)
 PROGRESS NOTE   Subjective/Complaints: Discussed plan for d/c on Tuesday, no therapy today, will have therapy on Sunday and Monday.  Discussed HS delirium   ROS: Negative except for HPI   Pt denies SOB, abd pain, CP, N/V/C/D, and vision changes, +delirium at night    Objective:   No results found. Recent Labs    01/31/24 0656  WBC 5.4  HGB 10.8*  HCT 33.0*  PLT 293   Recent Labs    01/31/24 0656  NA 135  K 3.4*  CL 103  CO2 24  GLUCOSE 153*  BUN 19  CREATININE 0.75  CALCIUM  9.1    Intake/Output Summary (Last 24 hours) at 02/02/2024 1057 Last data filed at 02/02/2024 0830 Gross per 24 hour  Intake 318 ml  Output --  Net 318 ml        Physical Exam: Vital Signs Blood pressure 139/68, pulse 77, temperature 98 F (36.7 C), temperature source Oral, resp. rate 18, height 6\' 2"  (1.88 m), weight 77.5 kg, SpO2 98%.        General: awake, alert, appropriate,  but woke him up- still sleepy- HOH- not wearing Hearing aids. NAD HENT: conjugate gaze; oropharynx extremely dry- gave water  CV: regular rate and rhythm; no JVD Pulmonary: CTA B/L; no W/R/R- good air movement GI: soft, NT, ND, (+)BS; normoactive Psychiatric: appropriate but sleepy Neurological: Ox3 this AM    MSK-Surgical dressing 2/3 off- looks OK so far    Cervical back: Neck supple. No tenderness.     Comments: Surgical dressing on right anterior hip. RLE limited by pain.  Stable, +left elbow swelling   Ue's 5-/5 LLE 5-/5 throughout LLE- HF 2/5- limited by pain- KE- "Cannot do" due to pain; DF/PF 5-/5 EHL 4+/5  Skin:    General: Skin is warm and dry.     Comments: Anterior R hip surgical dressing moderate swelling, no surrounding erythema or drainage noted  Neurological:     Mental Status: He is alert and oriented to person, place, and time.     Comments: Intact to light touch in all 4 extremities  Assessment/Plan: 1. Functional  deficits which require 3+ hours per day of interdisciplinary therapy in a comprehensive inpatient rehab setting. Physiatrist is providing close team supervision and 24 hour management of active medical problems listed below. Physiatrist and rehab team continue to assess barriers to discharge/monitor patient progress toward functional and medical goals  Care Tool:  Bathing    Body parts bathed by patient: Right arm, Left arm, Chest, Abdomen, Front perineal area, Buttocks, Right upper leg, Left upper leg, Face   Body parts bathed by helper: Buttocks, Right upper leg, Left upper leg Body parts n/a: Right lower leg, Left lower leg   Bathing assist Assist Level: Minimal Assistance - Patient > 75%     Upper Body Dressing/Undressing Upper body dressing   What is the patient wearing?: Pull over shirt    Upper body assist Assist Level: Supervision/Verbal cueing    Lower Body Dressing/Undressing Lower body dressing      What is the patient wearing?: Pants, Underwear/pull up     Lower body assist Assist for lower body dressing: Minimal  Assistance - Patient > 75%     Editor, commissioning assist Assist for toileting: Minimal Assistance - Patient > 75%     Transfers Chair/bed transfer  Transfers assist     Chair/bed transfer assist level: Contact Guard/Touching assist     Locomotion Ambulation   Ambulation assist   Ambulation activity did not occur: Safety/medical concerns  Assist level: Contact Guard/Touching assist Assistive device: Walker-rolling Max distance: 55   Walk 10 feet activity   Assist  Walk 10 feet activity did not occur: Safety/medical concerns  Assist level: Contact Guard/Touching assist Assistive device: Walker-rolling   Walk 50 feet activity   Assist Walk 50 feet with 2 turns activity did not occur: Safety/medical concerns  Assist level: Contact Guard/Touching assist Assistive device: Walker-rolling    Walk 150 feet  activity   Assist Walk 150 feet activity did not occur: Safety/medical concerns         Walk 10 feet on uneven surface  activity   Assist Walk 10 feet on uneven surfaces activity did not occur: Safety/medical concerns         Wheelchair     Assist Is the patient using a wheelchair?: Yes Type of Wheelchair: Manual Wheelchair activity did not occur: Safety/medical concerns  Wheelchair assist level: Supervision/Verbal cueing Max wheelchair distance: 150    Wheelchair 50 feet with 2 turns activity    Assist    Wheelchair 50 feet with 2 turns activity did not occur: Safety/medical concerns   Assist Level: Supervision/Verbal cueing   Wheelchair 150 feet activity     Assist  Wheelchair 150 feet activity did not occur: Safety/medical concerns   Assist Level: Supervision/Verbal cueing   Blood pressure 139/68, pulse 77, temperature 98 F (36.7 C), temperature source Oral, resp. rate 18, height 6\' 2"  (1.88 m), weight 77.5 kg, SpO2 98%.  Medical Problem List and Plan: 1. Functional deficits secondary to R femoral neck fx secondary to fall with R hemiarthroplasty             -patient may  shower- cover surgical incision             -ELOS/Goals: 7-10 days supervision             D/c set for 5/27  Con't CIR PT and OT- reassured wife that except for confusion at night, pt is doing great and progressing as anticipated - pt and wife are concerned won't be ready next Tuesday- let them know if he's not ready by Monday will reassess, but gains are going rapidly- is up to CGA- min A for most of ADL's Mobility  Pt wants private transport to get home because has 8 STE- also wants a w/c= per PT saying he needs it.  2.  Antithrombotics: -DVT/anticoagulation:  Pharmaceutical: Xarelto              -antiplatelet therapy: N/A 3.Recent L4 endplate Fx/ Pain Management after hip fx: On Cymbalta . Continue  hydrocodone  prn.    5/16- not taking Norco right now- and still confused last  night- wife has been refusing Tylenol  500 mg scheduled just taking 650 mg prn             --LSO ordered for support  5/17 hydrocodone  dose adjusted.  Patient was getting 5 mg dose for moderate pain.  For severe pain will adjust to 1 tab 7.5 and monitor and try this but if not controlled could consider adjust to 10 mg dose.  5/19- pt reports pain  doing well- better- so doesn't want change ot meds at this time  5/20 -5/21- pain doing a little better- con't regimen  5/23- having more pain this AM, but hasn't had Norco since 5/22 at 2pm, so not the cause of confusion overnight 4. Mood/Behavior/Sleep: LCSW to follow for evaluation and support.              -antipsychotic agents: N/A 5. Neuropsych/cognition: This patient is capable of making decisions on his own behalf during day- sundowning at night. 6. Skin/Wound Care: Routine pressure relief measures.  7. Fluids/Electrolytes/Nutrition: Monitor I/O. Check CMET in am              --encourage intake  8. Pre-renal azotemia: BUN/SCr 18/0.85 at admission-->26/0.91-->18/0.82  5/16- BUN 13 and Cr 0.72- doing better             --encourage fluid intake  -Recheck tomorrow  5/19- 16 and 0.82- doing better-  9. Acute blood loss anemia: Monitor for signs of bleeding. Recheck CBC in am             --Hgb down from 14.0-->10.1 likely due to post op loss.   5/16- Hb up to 10.9 today  5/19- Hb 11.0  5/22- Hb 10.8 10. Recurrent hypokalemia: Improved after supplement. Recheck in am.   5/22- K+ 3.4- will replete 40 mEq x1- and recheck Monday 11. Constipation: Sorbitol  today. Increase Senna S to 2 tabs bid.    5/16- no results- ordered soap suds enema to occur as soon as possible and not interfere with therapy.   5/23- LBM x2 yesterday 12. Diet controlled DM: Continue CM/make heart healthy diet.  Fair control, continue to monitor   5/19- CBGs overall controlled, but spiked yesterday at noon- will monitor-   5/20- continues to spike at noon- likely Ensure- will  monitor  5/22- stopped checking BG's since not on meds CBG (last 3)  No results for input(s): "GLUCAP" in the last 72 hours.   13. CAD/SSS/A fib: Monitor HR TID. On Xarelto . Heart healthy restrictions added per requrest. 14. HTN/Orthostatic hypotension: Monitor BP TID-on lisinopril  and amlodipine .   5/16- BP elevated to 160s this AM- but normally 130s-140s- con't to monitor trend  5/17 BP intermittently elevated although has had some issues with orthostatic hypotension., abd binder/TEDS for [facility]  5/18 -5/19- BP stable this morning, continue to monitor trend  5/20- had dizzy episode this AM, where sounds like BP dropped- didn't have syncope, etc, but was c/o dizziness- will monitor  5/22- Pt's BP 102/59 this AM- will monitor for orthostasis  5/23- BP doing better- 120s-130s' systolic-con't regimen    02/02/2024   10:21 AM 02/02/2024   10:17 AM 02/02/2024    5:16 AM  Vitals with BMI  Systolic 139 139 161  Diastolic 68 68 63  Pulse  77 98    15. Hx BPH: Toilet every 4 hours while awake.  16. Confusion overnight and urinary frequency/urgency  5/16- will check U/A and Cx  UTI treatment started, confusion appears to be improved 17. UTI- Proteus/Klebsiella Pneumo  -5/17 Start keflex , monitor cultures  5/19- based on Cx's that came back, Keflex  should be appropriate - sensitive to both Klebsiella pneumo; and Proteus. 5/21- will check with pharmacy to make sure is on correct ABX and also to make sure isn't on anything that would confuse pt.   5/22- Spoke with pharmacy- don't see reason meds or UTI could be causing confusion- on correct ABX- did maintain for 2 more days.  18. Poor appetite-  5/20- will order Ensure 2x/day between meals  Discussed that he is enjoying Ensure, continue  19. R foot pain  5/20- mild swelling on dorsum of foot- that's TTP- will monitor- could be swelling from R hip working down?  5/21- less pain this AM  5/22- no complaints of pain this AM  20.  Sundowning  5/22- no reason from meds to be causing sundowning- will start Seroquel 25 mg at bedtime since causes so much agitation in pt and distress.   5/23- 1 bad episode last night- will increase Seroquel to 50 mg at bedtime- if Keflex  is adding to confusion (which doesn't make sense since pt having clear sensorium during day), then it will be d/c'd today.   5/24: continue seroquel, scheduled melatonin  21. Left elbow swelling: continue ice  22. Fatigue: vitamin D supplement started   LOS: 9 days A FACE TO FACE EVALUATION WAS PERFORMED  Keven Pel Fidela Cieslak 02/02/2024, 10:57 AM

## 2024-02-02 NOTE — Plan of Care (Signed)
  Problem: Consults Goal: RH GENERAL PATIENT EDUCATION Description: See Patient Education module for education specifics. Outcome: Progressing   Problem: RH BOWEL ELIMINATION Goal: RH STG MANAGE BOWEL WITH ASSISTANCE Description: STG Manage Bowel with supervision Assistance. Outcome: Progressing   Problem: RH BLADDER ELIMINATION Goal: RH STG MANAGE BLADDER WITH ASSISTANCE Description: STG Manage Bladder With supervision Assistance Outcome: Progressing   Problem: RH SKIN INTEGRITY Goal: RH STG SKIN FREE OF INFECTION/BREAKDOWN Description: Manage skin free of infection/breakdown with supervision assistance Outcome: Progressing   Problem: RH SAFETY Goal: RH STG ADHERE TO SAFETY PRECAUTIONS W/ASSISTANCE/DEVICE Description: STG Adhere to Safety Precautions With supervision Assistance/Device. Outcome: Progressing   Problem: RH PAIN MANAGEMENT Goal: RH STG PAIN MANAGED AT OR BELOW PT'S PAIN GOAL Description: <4 w/ prns Outcome: Progressing   Problem: RH KNOWLEDGE DEFICIT GENERAL Goal: RH STG INCREASE KNOWLEDGE OF SELF CARE AFTER HOSPITALIZATION Description: Manage knowledge deficit with supervision assistance from wife using educational materials provided Outcome: Progressing

## 2024-02-02 NOTE — Progress Notes (Signed)
 Patient complaining of left arm pain. IV removed from area where pain is located 14 days ago. Purplish discoloration in the center that is tender and painful to the touch. With outside area raised and harden. Per patient and significant other this occurred over night. Also, per significant other patient sleep was erratic waking up several times throughout the night.

## 2024-02-03 NOTE — Progress Notes (Signed)
 Occupational Therapy Session Note  Patient Details  Name: Sean Lane MRN: [id number] Date of Birth: 1928-06-19  Today's Date: 02/03/2024 OT Individual Time: 1000-1115 OT Individual Time Calculation (min): 75 min    Short Term Goals: Week 1:  OT Short Term Goal 1 (Week 1): LTG=STG OT Short Term Goal 1 - Progress (Week 1): Progressing toward goal  Skilled Therapeutic Interventions/Progress Updates:    1:1 Pt received from NT on the way to the bathroom. Pt able to perform toilet transfer and clothing management with supervision but requested therapist perform hygiene after BM - "dont trust myself." Pt uses reacher to doff pants and socks. Pt transitioned into shower and bathed sit to stand from shower seat with grab bar with supervision. Pt used long handled sponge to bathe lower legs after demonstration. Pt ambulated out to w/c at sink to dress. Pt uses reacher to thread brief and pants and is able to stand and pull them up. TEDS donned with total A and pt needed A to fix/ adjust the back of his shoes which [name] reports she would be able to assist him with at home. Pt participated in shaving and tooth brushing at the sink with setup and therapist did finish helping him shave. Discussed options for pt if he got fatigued at home during grooming and needed to sit. Saw picture of bathroom and it would not fit a chair at the sink - discussed sitting on the toilet or performing in the kitchen.   Also discussed toileting in the middle of the night- pt usually gets up about 3 times a night to pee. At home used a urinal. Discussed whether they would sleep in the same bed or across the hall ability to communicate if needed to in the night. Also practiced bed mobility from a bed height of 27 1/2 inches without rails into flat bed. Pt was able to get in and out of the bed at this simulated height managing his own legs.   Also discussed if he went home with staples pt would need to cover them.   Pt left  resting in the bed with wife at bedside.   Therapy Documentation Precautions:  Precautions Precautions: Fall Required Braces or Orthoses: Spinal Brace Spinal Brace: Lumbar corset (OOB) Restrictions Weight Bearing Restrictions Per Provider Order: Yes RLE Weight Bearing Per Provider Order: Partial weight bearing  Pain: No c/o pain just fatigue throughout session - provided rest breaks prn   Therapy/Group: Individual Therapy  Hen[facility] 02/03/2024, 2:27 PM

## 2024-02-03 NOTE — Progress Notes (Signed)
 PROGRESS NOTE   Subjective/Complaints: Feeling better today Excite to go home soon Has therapy today Wife is assisting with dressing   ROS: Negative except for HPI   Pt denies SOB, abd pain, CP, N/V/C/D, and vision changes, +delirium at night, improved fatigue    Objective:   No results found. No results for input(s): "WBC", "HGB", "HCT", "PLT" in the last 72 hours.  No results for input(s): "NA", "K", "CL", "CO2", "GLUCOSE", "BUN", "CREATININE", "CALCIUM " in the last 72 hours.  No intake or output data in the 24 hours ending 02/03/24 1027       Physical Exam: Vital Signs Blood pressure (!) 132/53, pulse 66, temperature 98 F (36.7 C), temperature source Oral, resp. rate 18, height 6\' 2"  (1.88 m), weight 77.5 kg, SpO2 97%.  General: awake, alert, appropriate,  but woke him up- still sleepy- HOH- not wearing Hearing aids. NAD HENT: conjugate gaze; oropharynx extremely dry- gave water  CV: regular rate and rhythm; no JVD Pulmonary: CTA B/L; no W/R/R- good air movement GI: soft, NT, ND, (+)BS; normoactive Psychiatric: appropriate but sleepy Neurological: Ox3 this AM    MSK-Surgical dressing 2/3 off- looks OK so far    Cervical back: Neck supple. No tenderness.     Comments: Surgical dressing on right anterior hip. RLE limited by pain.  Stable, +left elbow swelling   Ue's 5-/5 LLE 5-/5 throughout LLE- HF 2/5- limited by pain- KE- "Cannot do" due to pain; DF/PF 5-/5 EHL 4+/5  Skin:    General: Skin is warm and dry.     Comments: Anterior R hip surgical dressing moderate swelling, no surrounding erythema or drainage noted  Neurological:     Mental Status: He is alert and oriented to person, place, and time.     Comments: Intact to light touch in all 4 extremities, stable 5/25  Assessment/Plan: 1. Functional deficits which require 3+ hours per day of interdisciplinary therapy in a comprehensive inpatient  rehab setting. Physiatrist is providing close team supervision and 24 hour management of active medical problems listed below. Physiatrist and rehab team continue to assess barriers to discharge/monitor patient progress toward functional and medical goals  Care Tool:  Bathing    Body parts bathed by patient: Right arm, Left arm, Chest, Abdomen, Front perineal area, Buttocks, Right upper leg, Left upper leg, Face   Body parts bathed by helper: Buttocks, Right upper leg, Left upper leg Body parts n/a: Right lower leg, Left lower leg   Bathing assist Assist Level: Minimal Assistance - Patient > 75%     Upper Body Dressing/Undressing Upper body dressing   What is the patient wearing?: Pull over shirt    Upper body assist Assist Level: Supervision/Verbal cueing    Lower Body Dressing/Undressing Lower body dressing      What is the patient wearing?: Pants, Underwear/pull up     Lower body assist Assist for lower body dressing: Minimal Assistance - Patient > 75%     Toileting Toileting    Toileting assist Assist for toileting: Minimal Assistance - Patient > 75%     Transfers Chair/bed transfer  Transfers assist     Chair/bed transfer assist level: Contact Guard/Touching assist  Locomotion Ambulation   Ambulation assist   Ambulation activity did not occur: Safety/medical concerns  Assist level: Contact Guard/Touching assist Assistive device: Walker-rolling Max distance: 55   Walk 10 feet activity   Assist  Walk 10 feet activity did not occur: Safety/medical concerns  Assist level: Contact Guard/Touching assist Assistive device: Walker-rolling   Walk 50 feet activity   Assist Walk 50 feet with 2 turns activity did not occur: Safety/medical concerns  Assist level: Contact Guard/Touching assist Assistive device: Walker-rolling    Walk 150 feet activity   Assist Walk 150 feet activity did not occur: Safety/medical concerns         Walk 10  feet on uneven surface  activity   Assist Walk 10 feet on uneven surfaces activity did not occur: Safety/medical concerns         Wheelchair     Assist Is the patient using a wheelchair?: Yes Type of Wheelchair: Manual Wheelchair activity did not occur: Safety/medical concerns  Wheelchair assist level: Supervision/Verbal cueing Max wheelchair distance: 150    Wheelchair 50 feet with 2 turns activity    Assist    Wheelchair 50 feet with 2 turns activity did not occur: Safety/medical concerns   Assist Level: Supervision/Verbal cueing   Wheelchair 150 feet activity     Assist  Wheelchair 150 feet activity did not occur: Safety/medical concerns   Assist Level: Supervision/Verbal cueing   Blood pressure (!) 132/53, pulse 66, temperature 98 F (36.7 C), temperature source Oral, resp. rate 18, height 6\' 2"  (1.88 m), weight 77.5 kg, SpO2 97%.  Medical Problem List and Plan: 1. Functional deficits secondary to R femoral neck fx secondary to fall with R hemiarthroplasty             -patient may  shower- cover surgical incision             -ELOS/Goals: 7-10 days supervision             D/c set for 5/27  Con't CIR PT and OT- reassured wife that except for confusion at night, pt is doing great and progressing as anticipated - pt and wife are concerned won't be ready next Tuesday- let them know if he's not ready by Monday will reassess, but gains are going rapidly- is up to CGA- min A for most of ADL's Mobility  Pt wants private transport to get home because has 8 STE- also wants a w/c= per PT saying he needs it.  2.  Antithrombotics: -DVT/anticoagulation:  Pharmaceutical: Xarelto              -antiplatelet therapy: N/A 3.Recent L4 endplate Fx/ Pain Management after hip fx: On Cymbalta . Continue  hydrocodone  prn.    5/16- not taking Norco right now- and still confused last night- wife has been refusing Tylenol  500 mg scheduled just taking 650 mg prn             --LSO  ordered for support  5/17 hydrocodone  dose adjusted.  Patient was getting 5 mg dose for moderate pain.  For severe pain will adjust to 1 tab 7.5 and monitor and try this but if not controlled could consider adjust to 10 mg dose.  5/19- pt reports pain doing well- better- so doesn't want change ot meds at this time  5/20 -5/21- pain doing a little better- con't regimen  5/23- having more pain this AM, but hasn't had Norco since 5/22 at 2pm, so not the cause of confusion overnight 4. Mood/Behavior/Sleep: LCSW to follow  for evaluation and support.              -antipsychotic agents: N/A 5. Neuropsych/cognition: This patient is capable of making decisions on his own behalf during day- sundowning at night. 6. Skin/Wound Care: Routine pressure relief measures.  7. Fluids/Electrolytes/Nutrition: Monitor I/O. Check CMET in am              --encourage intake  8. Pre-renal azotemia: BUN/SCr 18/0.85 at admission-->26/0.91-->18/0.82  5/16- BUN 13 and Cr 0.72- doing better             --encourage fluid intake  -Recheck tomorrow  5/19- 16 and 0.82- doing better-  9. Acute blood loss anemia: Monitor for signs of bleeding. Recheck CBC in am             --Hgb down from 14.0-->10.1 likely due to post op loss.   5/16- Hb up to 10.9 today  5/19- Hb 11.0  5/22- Hb 10.8 10. Recurrent hypokalemia: Improved after supplement. Recheck in am.   5/22- K+ 3.4- will replete 40 mEq x1- and recheck Monday 11. Constipation: Sorbitol  today. Increase Senna S to 2 tabs bid.    5/16- no results- ordered soap suds enema to occur as soon as possible and not interfere with therapy.   5/23- LBM x2 yesterday 12. Diet controlled DM: Continue CM/make heart healthy diet.  Fair control, continue to monitor   5/19- CBGs overall controlled, but spiked yesterday at noon- will monitor-   5/20- continues to spike at noon- likely Ensure- will monitor  5/22- stopped checking BG's since not on meds CBG (last 3)  No results for input(s):  "GLUCAP" in the last 72 hours.   13. CAD/SSS/A fib: Monitor HR TID. On Xarelto . Heart healthy restrictions added per requrest. 14. HTN/Orthostatic hypotension: Monitor BP TID-on lisinopril  and amlodipine .   5/16- BP elevated to 160s this AM- but normally 130s-140s- con't to monitor trend  5/17 BP intermittently elevated although has had some issues with orthostatic hypotension., abd binder/TEDS for [facility]  5/18 -5/19- BP stable this morning, continue to monitor trend  5/20- had dizzy episode this AM, where sounds like BP dropped- didn't have syncope, etc, but was c/o dizziness- will monitor  5/22- Pt's BP 102/59 this AM- will monitor for orthostasis  5/23- BP doing better- 120s-130s' systolic-con't regimen    02/03/2024    4:25 AM 02/02/2024    6:46 PM 02/02/2024    5:22 PM  Vitals with BMI  Systolic 132 126 161  Diastolic 53 75 60  Pulse 66 78     15. Hx BPH: Toilet every 4 hours while awake.  16. Confusion overnight and urinary frequency/urgency  5/16- will check U/A and Cx  UTI treatment started, confusion appears to be improved 17. UTI- Proteus/Klebsiella Pneumo  -5/17 Start keflex , monitor cultures  5/19- based on Cx's that came back, Keflex  should be appropriate - sensitive to both Klebsiella pneumo; and Proteus. 5/21- will check with pharmacy to make sure is on correct ABX and also to make sure isn't on anything that would confuse pt.   5/22- Spoke with pharmacy- don't see reason meds or UTI could be causing confusion- on correct ABX- did maintain for 2 more days.   18. Poor appetite-  5/20- will order Ensure 2x/day between meals  Discussed that he is enjoying Ensure, continue  19. R foot pain  5/20- mild swelling on dorsum of foot- that's TTP- will monitor- could be swelling from R hip working down?  5/21-  less pain this AM  5/22- no complaints of pain this AM  20. Sundowning  5/22- no reason from meds to be causing sundowning- will start Seroquel 25 mg at bedtime since  causes so much agitation in pt and distress.   5/23- 1 bad episode last night- will increase Seroquel to 50 mg at bedtime- if Keflex  is adding to confusion (which doesn't make sense since pt having clear sensorium during day), then it will be d/c'd today.   Continue seroquel, scheduled melatonin  21. Left elbow swelling: continue ice  22. Fatigue: vitamin D supplement started, helpful, continue  23. Hypotension: d/c amlodipine    LOS: 10 days A FACE TO FACE EVALUATION WAS PERFORMED  Sean Lane 02/03/2024, 10:27 AM

## 2024-02-04 LAB — BASIC METABOLIC PANEL WITH GFR
Anion gap: 6 (ref 5–15)
BUN: 21 mg/dL (ref 8–23)
CO2: 27 mmol/L (ref 22–32)
Calcium: 9.2 mg/dL (ref 8.9–10.3)
Chloride: 102 mmol/L (ref 98–111)
Creatinine, Ser: 0.91 mg/dL (ref 0.61–1.24)
GFR, Estimated: >60 mL/min (ref >60)
Glucose, Bld: 129 mg/dL — ABNORMAL HIGH (ref 70–99)
Potassium: 3.8 mmol/L (ref 3.5–5.1)
Sodium: 135 mmol/L (ref 135–145)

## 2024-02-04 LAB — CBC
HCT: 33.3 % — ABNORMAL LOW (ref 39.0–52.0)
Hemoglobin: 10.7 g/dL — ABNORMAL LOW (ref 13.0–17.0)
MCH: 30.7 pg (ref 26.0–34.0)
MCHC: 32.1 g/dL (ref 30.0–36.0)
MCV: 95.7 fL (ref 80.0–100.0)
Platelets: 356 10*3/uL (ref 150–400)
RBC: 3.48 MIL/uL — ABNORMAL LOW (ref 4.22–5.81)
RDW: 15.2 % (ref 11.5–15.5)
WBC: 4 10*3/uL (ref 4.0–10.5)
nRBC: 0 % (ref 0.0–0.2)

## 2024-02-04 MED ORDER — LISINOPRIL 5 MG PO TABS
5.0000 mg | ORAL_TABLET | Freq: Every day | ORAL | 0 refills | Status: DC
  Filled 2024-02-04: qty 30, 30d supply, fill #0

## 2024-02-04 MED ORDER — HYDROCODONE-ACETAMINOPHEN 5-325 MG PO TABS
1.0000 | ORAL_TABLET | Freq: Three times a day (TID) | ORAL | 0 refills | Status: DC | PRN
  Filled 2024-02-04: qty 20, 7d supply, fill #0

## 2024-02-04 MED ORDER — ACETAMINOPHEN ER 650 MG PO TBCR: 1300.0000 mg | EXTENDED_RELEASE_TABLET | Freq: Three times a day (TID) | ORAL | Status: DC | PRN

## 2024-02-04 MED ORDER — SENNOSIDES-DOCUSATE SODIUM 8.6-50 MG PO TABS
2.0000 | ORAL_TABLET | Freq: Every day | ORAL | 0 refills | Status: DC
  Filled 2024-02-04: qty 30, 15d supply, fill #0

## 2024-02-04 MED ORDER — QUETIAPINE FUMARATE 50 MG PO TABS
50.0000 mg | ORAL_TABLET | Freq: Every day | ORAL | 0 refills | Status: DC
  Filled 2024-02-04: qty 30, 30d supply, fill #0

## 2024-02-04 MED ORDER — MELATONIN 5 MG PO TABS
5.0000 mg | ORAL_TABLET | Freq: Every day | ORAL | 0 refills | Status: DC
  Filled 2024-02-04: qty 30, 30d supply, fill #0

## 2024-02-04 NOTE — Progress Notes (Signed)
 Occupational Therapy Discharge Summary  Patient Details  Name: Sean Lane MRN: [id number] Date of Birth: 1928/04/21  Date of Discharge from OT service:Feb 04, 2024  Patient has met 9 of 10 long term goals due to {due to:3041651}.  Pt made steady progress with BADLs and functional transfers during this admission. PT completes bathing at shower level with setup assist. UB dressing with mod I. LB dressing with min A for shoes/TEd hose. All transfers with mod I and toileting with mod I. Recommend supervision at discharge.Wife has participated in therapy sessions. Pt and wife pleased with progress and ready for discharge home. Patient to discharge at overall {LOA:3049010} level.  Patient's care partner {care partner:3041650} to provide the necessary {assistance:3041652} assistance at discharge.    Reasons goals not met: min A for LB dressing  Recommendation:  Patient will benefit from ongoing skilled OT services in {setting:3041680} to continue to advance functional skills in the area of {ADL/iADL:3041649}.  Equipment: BSC, shower seat  Reasons for discharge: {Reason for discharge:3049018}  Patient/family agrees with progress made and goals achieved: {Pt/Family agree with progress/goals:3049020}  OT Discharge ADL ADL Equipment Provided: Reacher Eating: Independent Where Assessed-Eating: Wheelchair Grooming: Modified independent Where Assessed-Grooming: Sitting at sink Upper Body Bathing: Setup Where Assessed-Upper Body Bathing: Shower Lower Body Bathing: Supervision/safety Where Assessed-Lower Body Bathing: Shower Upper Body Dressing: Independent Where Assessed-Upper Body Dressing: Edge of bed Lower Body Dressing: Modified independent Where Assessed-Lower Body Dressing: Standing at sink Toileting: Modified independent Where Assessed-Toileting: Bedside Commode, Toilet Toilet Transfer: Modified independent Toilet Transfer Method: Ambulating, Stand pivot Education administrator: Psychiatric nurse: Close supervision Film/video editor Method: Designer, industrial/product: Information systems manager with back, Grab bars Vision Baseline Vision/History: 1 Wears glasses Patient Visual Report: No change from baseline Vision Assessment?: No apparent visual deficits Perception  Perception: Within Functional Limits Praxis Praxis: WFL Cognition Cognition Overall Cognitive Status: History of cognitive impairments - at baseline Arousal/Alertness: Awake/alert Person: Oriented Place: Oriented Situation: Oriented Memory: Impaired Memory Impairment: Decreased recall of new information Attention: Sustained Sustained Attention: Appears intact Awareness: Appears intact Problem Solving: Impaired Problem Solving Impairment: Functional basic Safety/Judgment: Appears intact Brief Interview for Mental Status (BIMS) Repetition of Three Words (First Attempt): 3 Temporal Orientation: Year: Correct Temporal Orientation: Month: Missed by more than 1 month Temporal Orientation: Day: Correct Recall: "Sock": Yes, no cue required Recall: "Blue": Yes, no cue required Recall: "Bed": Yes, after cueing ("a piece of furniture") BIMS Summary Score: 12 Sensation Sensation Light Touch: Appears Intact Proprioception: Appears Intact Coordination Gross Motor Movements are Fluid and Coordinated: Yes Fine Motor Movements are Fluid and Coordinated: Yes Motor  Motor Motor: Within Functional Limits Motor - Skilled Clinical Observations: generalized weakness and pain Trunk/Postural Assessment  Cervical Assessment Cervical Assessment: Within Functional Limits Thoracic Assessment Thoracic Assessment: Within Functional Limits Lumbar Assessment Lumbar Assessment: Exceptions to [facility] (lumbar fx) Postural Control Postural Control: Deficits on evaluation Trunk Control: good  Balance Static Sitting Balance Static Sitting - Balance Support: Feet supported Static  Sitting - Level of Assistance: 6: Modified independent (Device/Increase time) Dynamic Sitting Balance Dynamic Sitting - Level of Assistance: 6: Modified independent (Device/Increase time) Extremity/Trunk Assessment RUE Assessment RUE Assessment: Within Functional Limits LUE Assessment LUE Assessment: Within Functional Limits   Sean Lane 02/04/2024, 10:55 AM

## 2024-02-04 NOTE — Progress Notes (Signed)
 Occupational Therapy Session Note  Patient Details  Name: Sean Lane MRN: [id number] Date of Birth: 03/08/1928  Today's Date: 02/04/2024 OT Individual Time: 9562-1308 OT Individual Time Calculation (min): 72 min    Short Term Goals: Week 2:  OT Short Term Goal 1 (Week 2): LTG=STG 2/2 ELOS  Skilled Therapeutic Interventions/Progress Updates:    Pt resting in w/c upon arrival with wife present. Pt and wife ready for d/c home and feel prepared. OT intervention with focus on practicing donning abdominal binder and reviewing purpose of binder and Ted hose to maintain BP. Pt and wife verbalized understanding. Pt practiced stepping into/out of walk in shower (4" rise) with CGA. Recommended pt wait until HHOT has chance to evaluate home setup before attempting shower at home. Bed rail info provided for pt to order for use at home. Pt transfer to EOB and performed sit>supine with supervision and no bed rails. Recommended setting schedule at home. Pt remained in bed with all needs within reach. Wife present.   Therapy Documentation Precautions:  Precautions Precautions: Fall Required Braces or Orthoses: Spinal Brace Spinal Brace: Lumbar corset (OOB) Restrictions Weight Bearing Restrictions Per Provider Order: Yes RLE Weight Bearing Per Provider Order: Partial weight bearing    Pain: Pt reported increased back discomfort later in session; returned to bed with relief noted   Therapy/Group: Individual Therapy  [name] 02/04/2024, 10:55 AM

## 2024-02-04 NOTE — Progress Notes (Signed)
 Patient ID: Sean Lane, male   DOB: 1928-04-08, 89 y.o.   MRN: [id number]  SW informed pt will need ambulance transport home.   SW scheduled ambulance pick up at 10:30am via [facility].   *SW spoke with pt wife to discuss above. SW shared she will need to make copay with regard to DME. SW provided contact number for follow-up.    Norval Been, MSW, LCSW Office: [phone] Cell: [phone] Fax: [phone]

## 2024-02-04 NOTE — Progress Notes (Signed)
 Physical Therapy Discharge Summary  Patient Details  Name: Sean Lane MRN: [id number] Date of Birth: 10-Feb-1928  Date of Discharge from PT service:Feb 04, 2024  Today's Date: 02/04/2024 PT Individual Time: 1300-1400 PT Individual Time Calculation (min): 60 min    Patient has met 8 of 9 long term goals due to improved balance, improved postural control, increased strength, decreased pain, and ability to compensate for deficits.  Patient to discharge at a wheelchair level Supervision.   Patient's care partner is independent to provide the necessary physical and cognitive assistance at discharge.  Reasons goals not met: Pt did not meet stair goal d/t pain and LE weakness, adequate for discharge as pt and family opted for ambulance transport.   Recommendation:  Patient will benefit from ongoing skilled PT services in home health setting to continue to advance safe functional mobility, address ongoing impairments in strength, endurance, balance, ROM, and minimize fall risk.  Equipment: W/c  Reasons for discharge: treatment goals met and discharge from hospital  Patient/family agrees with progress made and goals achieved: Yes  Skilled Therapeutic Interventions/Progress Updates:  Pt recd in bed, reports increasing pain levels with mobility, no intervention required.   Session focused on family education and d/c assessments. Pt performed bed mobility with no bed rail with supervision and cueing. Car transfer with supervision and RW. Pt performed 6" stairs x8 with lateral technique and CGA. Pt then provided with HEP to progress strength and ROM and bridge gap to follow up therapies. Pt returned to bed and was left with all needs in reach and alarm active.   PT Discharge Precautions/Restrictions Precautions Precautions: Fall Required Braces or Orthoses: Spinal Brace Spinal Brace: Lumbar corset Restrictions Weight Bearing Restrictions Per Provider Order: Yes RLE Weight Bearing Per  Provider Order: Weight bearing as tolerated Vital Signs Therapy Vitals Temp: 98.5 F (36.9 C) Temp Source: Oral Pulse Rate: 77 Resp: 17 BP: (!) 105/47 Patient Position (if appropriate): Lying Oxygen Therapy SpO2: 97 % O2 Device: Room Air Pain   Pain Interference Pain Interference Pain Effect on Sleep: 1. Rarely or not at all Pain Interference with Therapy Activities: 2. Occasionally Pain Interference with Day-to-Day Activities: 2. Occasionally Vision/Perception  Vision - History Ability to See in Adequate Light: 0 Adequate Perception Perception: Within Functional Limits Praxis Praxis: WFL  Cognition Overall Cognitive Status: History of cognitive impairments - at baseline Arousal/Alertness: Awake/alert Orientation Level: Oriented X4 Sustained Attention: Appears intact Memory: Impaired Awareness: Appears intact Problem Solving: Impaired Safety/Judgment: Appears intact Sensation Sensation Light Touch: Appears Intact Proprioception: Appears Intact Coordination Gross Motor Movements are Fluid and Coordinated: Yes Fine Motor Movements are Fluid and Coordinated: Yes Coordination and Movement Description: limited coordination in right Le due to pain and weakness Motor  Motor Motor: Within Functional Limits Motor - Skilled Clinical Observations: generalized weakness and pain  Mobility Bed Mobility Bed Mobility: Supine to Sit;Sit to Supine Supine to Sit: Supervision/Verbal cueing Sit to Supine: Supervision/Verbal cueing Transfers Transfers: Sit to Stand;Stand Pivot Transfers;Squat Pivot Transfers Sit to Stand: Supervision/Verbal cueing Stand Pivot Transfers: Supervision/Verbal cueing Stand Pivot Transfer Details: Verbal cues for precautions/safety;Verbal cues for safe use of DME/AE;Verbal cues for sequencing;Verbal cues for technique Transfer (Assistive device): Rolling walker Locomotion  Gait Ambulation: Yes Gait Assistance: Supervision/Verbal cueing Gait  Distance (Feet): 55 Feet Assistive device: Rolling walker Gait Gait: Yes Gait Pattern: Step-to pattern Stairs / Additional Locomotion Stairs: Yes Stairs Assistance: Contact Guard/Touching assist Stair Management Technique: One rail Right Number of Stairs: 8 Height of Stairs: 6  Pick up small object from the floor (from standing position) activity did not occur: Safety/medical concerns Naval architect Mobility: Yes Wheelchair Assistance: Doctor, general practice: Both upper extremities Wheelchair Parts Management: Supervision/cueing  Trunk/Postural Assessment  Cervical Assessment Cervical Assessment: Within Functional Limits Thoracic Assessment Thoracic Assessment: Within Functional Limits Lumbar Assessment Lumbar Assessment: Exceptions to [facility] Postural Control Postural Control: Within Functional Limits Trunk Control: good  Balance Balance Balance Assessed: Yes Static Sitting Balance Static Sitting - Balance Support: Feet supported Static Sitting - Level of Assistance: 6: Modified independent (Device/Increase time) Dynamic Sitting Balance Dynamic Sitting - Balance Support: During functional activity Dynamic Sitting - Level of Assistance: 6: Modified independent (Device/Increase time) Static Standing Balance Static Standing - Balance Support: During functional activity;Bilateral upper extremity supported Static Standing - Level of Assistance: 6: Modified independent (Device/Increase time) Dynamic Standing Balance Dynamic Standing - Balance Support: During functional activity Dynamic Standing - Level of Assistance: 5: Stand by assistance Extremity Assessment      RLE Assessment RLE Assessment: Exceptions to [facility] Passive Range of Motion (PROM) Comments: lacking ~15 deg knee extension General Strength Comments: Hip function 3/5 d/t pain, knee 4/5. ankle 4/5 LLE Assessment LLE Assessment: Within Functional Limits General Strength  Comments: grossly 4/5   Jaiyden Laur C Arty Lantzy 02/04/2024, 4:32 PM

## 2024-02-04 NOTE — Progress Notes (Signed)
 Physical Therapy Session Note  Patient Details  Name: Sean Lane MRN: [id number] Date of Birth: 03/30/1928  Today's Date: 02/04/2024 PT Individual Time: 0800-0900 PT Individual Time Calculation (min): 60 min   Short Term Goals: Week 1:  PT Short Term Goal 1 (Week 1): =LTGs d/t ELOS  Skilled Therapeutic Interventions/Progress Updates:    pt received in bed and agreeable to therapy. Pt reports 6/10 pain at rest, nsg provided pain medication during session. Bed mobility with supervision after cues for easier technique. Donned shoes with assist. Close supervision for Stand pivot transfer with RW. BP found to be 95/53(68), so donned binder. BP then found to be 126/60(78). Pt then transported to therapy gym for time management. Navigated x 4 steps with min a with fwd technique. Cues for lateral technique and pt was able to perform 2 x 4 with lateral technique and CGA. Providing education to wait on performing stairs at home until after working on them with home health. Pt returned to room and remained in w/c, with needs in reach and his wife present.   Therapy Documentation Precautions:  Precautions Precautions: Fall Required Braces or Orthoses: Spinal Brace Spinal Brace: Lumbar corset (OOB) Restrictions Weight Bearing Restrictions Per Provider Order: Yes RLE Weight Bearing Per Provider Order: Partial weight bearing General:       Therapy/Group: Individual Therapy  Tex Filbert 02/04/2024, 12:47 PM

## 2024-02-04 NOTE — Progress Notes (Signed)
 PROGRESS NOTE   Subjective/Complaints:  Woke up to pee multiple times, but no confusion last 2 days.  Seroquel  worked well-  2 good days of therapy.  Felt like was dying- not repeated since then.  Had diarrhea Friday, but LBM 5/24. Also has swelling of L elbow.   ROS: Negative except for HPI   Pt denies SOB, abd pain, CP, N/V/C/D, and vision changes L elbow swelling    Objective:   No results found. Recent Labs    02/04/24 0559  WBC 4.0  HGB 10.7*  HCT 33.3*  PLT 356    Recent Labs    02/04/24 0559  NA 135  K 3.8  CL 102  CO2 27  GLUCOSE 129*  BUN 21  CREATININE 0.91  CALCIUM  9.2     Intake/Output Summary (Last 24 hours) at 02/04/2024 0850 Last data filed at 02/04/2024 0359 Gross per 24 hour  Intake 237 ml  Output 425 ml  Net -188 ml         Physical Exam: Vital Signs Blood pressure 124/68, pulse 73, temperature 97.6 F (36.4 C), temperature source Oral, resp. rate 18, height 6\' 2"  (1.88 m), weight 77.5 kg, SpO2 99%.    General: awake, alert, appropriate, woke from sleep- wife at bedside; NAD HENT: conjugate gaze; oropharynx moist CV: regular rate; irregular rhythm; no JVD Pulmonary: CTA B/L; no W/R/R- good air movement GI: soft, NT, ND, (+)BS Psychiatric: appropriate Neurological: Ox3 Sleepy- just woke up  MSK-Surgical dressing 2/3 off- looks OK so far    Cervical back: Neck supple. No tenderness.     Comments: Surgical dressing on right anterior hip. RLE limited by pain.  Stable, +left elbow swelling   Ue's 5-/5 LLE 5-/5 throughout LLE- HF 2/5- limited by pain- KE- "Cannot do" due to pain; DF/PF 5-/5 EHL 4+/5  Skin:    General: Skin is warm and dry.     Comments: Anterior R hip surgical dressing moderate swelling, no surrounding erythema or drainage noted  Neurological:     Mental Status: He is alert and oriented to person, place, and time.     Comments: Intact to light touch  in all 4 extremities, stable 5/25  Assessment/Plan: 1. Functional deficits which require 3+ hours per day of interdisciplinary therapy in a comprehensive inpatient rehab setting. Physiatrist is providing close team supervision and 24 hour management of active medical problems listed below. Physiatrist and rehab team continue to assess barriers to discharge/monitor patient progress toward functional and medical goals  Care Tool:  Bathing    Body parts bathed by patient: Left arm, Right arm, Chest, Abdomen, Front perineal area, Buttocks, Right upper leg, Left upper leg, Right lower leg, Left lower leg, Face   Body parts bathed by helper: Buttocks, Right upper leg, Left upper leg Body parts n/a: Right lower leg, Left lower leg   Bathing assist Assist Level: Supervision/Verbal cueing     Upper Body Dressing/Undressing Upper body dressing   What is the patient wearing?: Pull over shirt    Upper body assist Assist Level: Set up assist    Lower Body Dressing/Undressing Lower body dressing      What is the  patient wearing?: Incontinence brief, Pants     Lower body assist Assist for lower body dressing: Supervision/Verbal cueing     Toileting Toileting    Toileting assist Assist for toileting: Moderate Assistance - Patient 50 - 74%     Transfers Chair/bed transfer  Transfers assist     Chair/bed transfer assist level: Supervision/Verbal cueing     Locomotion Ambulation   Ambulation assist   Ambulation activity did not occur: Safety/medical concerns  Assist level: Contact Guard/Touching assist Assistive device: Walker-rolling Max distance: 55   Walk 10 feet activity   Assist  Walk 10 feet activity did not occur: Safety/medical concerns  Assist level: Contact Guard/Touching assist Assistive device: Walker-rolling   Walk 50 feet activity   Assist Walk 50 feet with 2 turns activity did not occur: Safety/medical concerns  Assist level: Contact  Guard/Touching assist Assistive device: Walker-rolling    Walk 150 feet activity   Assist Walk 150 feet activity did not occur: Safety/medical concerns         Walk 10 feet on uneven surface  activity   Assist Walk 10 feet on uneven surfaces activity did not occur: Safety/medical concerns         Wheelchair     Assist Is the patient using a wheelchair?: Yes Type of Wheelchair: Manual Wheelchair activity did not occur: Safety/medical concerns  Wheelchair assist level: Supervision/Verbal cueing Max wheelchair distance: 150    Wheelchair 50 feet with 2 turns activity    Assist    Wheelchair 50 feet with 2 turns activity did not occur: Safety/medical concerns   Assist Level: Supervision/Verbal cueing   Wheelchair 150 feet activity     Assist  Wheelchair 150 feet activity did not occur: Safety/medical concerns   Assist Level: Supervision/Verbal cueing   Blood pressure 124/68, pulse 73, temperature 97.6 F (36.4 C), temperature source Oral, resp. rate 18, height 6\' 2"  (1.88 m), weight 77.5 kg, SpO2 99%.  Medical Problem List and Plan: 1. Functional deficits secondary to R femoral neck fx secondary to fall with R hemiarthroplasty             -patient may  shower- cover surgical incision             -ELOS/Goals: 7-10 days supervision             D/c set for 5/27  Con't CIR PT and OT Wife feels pt is ready for d/c tomorrow- d/w SW about ride home.  -.  2.  Antithrombotics: -DVT/anticoagulation:  Pharmaceutical: Xarelto              -antiplatelet therapy: N/A 3.Recent L4 endplate Fx/ Pain Management after hip fx: On Cymbalta . Continue  hydrocodone  prn.    5/16- not taking Norco right now- and still confused last night- wife has been refusing Tylenol  500 mg scheduled just taking 650 mg prn             --LSO ordered for support  5/17 hydrocodone  dose adjusted.  Patient was getting 5 mg dose for moderate pain.  For severe pain will adjust to 1 tab 7.5 and  monitor and try this but if not controlled could consider adjust to 10 mg dose.  5/19- pt reports pain doing well- better- so doesn't want change ot meds at this time  5/20 -5/21- pain doing a little better- con't regimen  5/23- having more pain this AM, but hasn't had Norco since 5/22 at 2pm, so not the cause of confusion overnight  5/26- Taking  Norco ~1x/day.  4. Mood/Behavior/Sleep: LCSW to follow for evaluation and support.              -antipsychotic agents: N/A 5. Neuropsych/cognition: This patient is capable of making decisions on his own behalf during day- sundowning at night. 6. Skin/Wound Care: Routine pressure relief measures.  7. Fluids/Electrolytes/Nutrition: Monitor I/O. Check CMET in am              --encourage intake  8. Pre-renal azotemia: BUN/SCr 18/0.85 at admission-->26/0.91-->18/0.82  5/16- BUN 13 and Cr 0.72- doing better             --encourage fluid intake  -Recheck tomorrow  5/19- 16 and 0.82- doing better-  9. Acute blood loss anemia: Monitor for signs of bleeding. Recheck CBC in am             --Hgb down from 14.0-->10.1 likely due to post op loss.   5/16- Hb up to 10.9 today  5/19- Hb 11.0  5/22- Hb 10.8 10. Recurrent hypokalemia: Improved after supplement. Recheck in am.   5/22- K+ 3.4- will replete 40 mEq x1- and recheck Monday 11. Constipation: Sorbitol  today. Increase Senna S to 2 tabs bid.    5/16- no results- ordered soap suds enema to occur as soon as possible and not interfere with therapy.   5/23- LBM x2 yesterday  5/26- LBM 5/24 12. Diet controlled DM: Continue CM/make heart healthy diet.  Fair control, continue to monitor   5/19- CBGs overall controlled, but spiked yesterday at noon- will monitor-   5/20- continues to spike at noon- likely Ensure- will monitor  5/22- stopped checking BG's since not on meds CBG (last 3)  No results for input(s): "GLUCAP" in the last 72 hours.   13. CAD/SSS/A fib: Monitor HR TID. On Xarelto . Heart healthy  restrictions added per requrest. 14. HTN/Orthostatic hypotension: Monitor BP TID-on lisinopril  and amlodipine .   5/16- BP elevated to 160s this AM- but normally 130s-140s- con't to monitor trend  5/17 BP intermittently elevated although has had some issues with orthostatic hypotension., abd binder/TEDS for [facility]  5/18 -5/19- BP stable this morning, continue to monitor trend  5/20- had dizzy episode this AM, where sounds like BP dropped- didn't have syncope, etc, but was c/o dizziness- will monitor  5/22- Pt's BP 102/59 this AM- will monitor for orthostasis  5/23-5/26 BP doing better- 120s-130s' systolic-con't regimen    02/04/2024    3:57 AM 02/03/2024    7:34 PM 02/03/2024    2:45 PM  Vitals with BMI  Systolic 124 123 161  Diastolic 68 55 55  Pulse 73 73 80    15. Hx BPH: Toilet every 4 hours while awake.  16. Confusion overnight and urinary frequency/urgency  5/16- will check U/A and Cx  UTI treatment started, confusion appears to be improved 17. UTI- Proteus/Klebsiella Pneumo  -5/17 Start keflex , monitor cultures  5/19- based on Cx's that came back, Keflex  should be appropriate - sensitive to both Klebsiella pneumo; and Proteus. 5/21- will check with pharmacy to make sure is on correct ABX and also to make sure isn't on anything that would confuse pt.   5/22- Spoke with pharmacy- don't see reason meds or UTI could be causing confusion- on correct ABX- did maintain for 2 more days.   18. Poor appetite-  5/20- will order Ensure 2x/day between meals  Discussed that he is enjoying Ensure, continue  19. R foot pain  5/20- mild swelling on dorsum of foot- that's TTP-  will monitor- could be swelling from R hip working down?  5/21- less pain this AM  5/22- no complaints of pain this AM  20. Sundowning  5/22- no reason from meds to be causing sundowning- will start Seroquel 25 mg at bedtime since causes so much agitation in pt and distress.   5/23- 1 bad episode last night- will increase  Seroquel to 50 mg at bedtime- if Keflex  is adding to confusion (which doesn't make sense since pt having clear sensorium during day), then it will be d/c'd today.   Continue seroquel, scheduled melatonin  5/26- pt doing MUCH better with Seroquel-  21. Left elbow swelling: continue ice  22. Fatigue: vitamin D supplement started, helpful, continue  23. Hypotension: d/c amlodipine    I spent a total of 39   minutes on total care today- >50% coordination of care- due to  D/w nursing and SW about pt's and wife- went over labs and vitals  LOS: 11 days A FACE TO FACE EVALUATION WAS PERFORMED  Yosgart Pavey 02/04/2024, 8:50 AM

## 2024-02-05 ENCOUNTER — Other Ambulatory Visit (HOSPITAL_COMMUNITY): Payer: Self-pay

## 2024-02-05 DIAGNOSIS — I10 Essential (primary) hypertension: Secondary | ICD-10-CM | POA: Insufficient documentation

## 2024-02-05 DIAGNOSIS — G4700 Insomnia, unspecified: Secondary | ICD-10-CM | POA: Insufficient documentation

## 2024-02-05 DIAGNOSIS — F05 Delirium due to known physiological condition: Secondary | ICD-10-CM | POA: Insufficient documentation

## 2024-02-05 NOTE — Discharge Summary (Signed)
 Physician Discharge Summary  Patient ID: Sean Lane MRN: [id number] DOB/AGE: 1928-02-13 89 y.o.  Admit date: 01/24/2024 Discharge date: 02/05/2024  Discharge Diagnoses:  Principal Problem:   Fracture, proximal femur, right, sequela Active Problems:   Spasm of left piriformis muscle   Sensorineural hearing loss (SNHL) of both ears   Orthostatic hypertension   Delirium due to general medical condition   Insomnia   Discharged Condition: stable  Significant Diagnostic Studies: DG HIP UNILAT W OR W/O PELVIS 2-3 VIEWS RIGHT Result Date: 01/20/2024 CLINICAL DATA:  Postop. EXAM: DG HIP (WITH OR WITHOUT PELVIS) 2-3V RIGHT COMPARISON:  None Available. FINDINGS: Right hip arthroplasty in expected alignment. No periprosthetic lucency or fracture. Cerclage wire about the femoral stem. Recent postsurgical change includes air and edema in the soft tissues. Lateral skin staples in place. IMPRESSION: Right hip arthroplasty without immediate postoperative complication. Electronically Signed   By: Chadwick Colonel M.D.   On: 01/20/2024 20:14   DG HIP UNILAT WITH PELVIS 1V RIGHT Result Date: 01/20/2024 CLINICAL DATA:  Elective surgery. EXAM: DG HIP (WITH OR WITHOUT PELVIS) 1V RIGHT COMPARISON:  Preoperative imaging. FINDINGS: Seven fluoroscopic spot views of the pelvis and right hip obtained in the operating room. Sequential images during hip arthroplasty. Fluoroscopy time 10.6 seconds. Dose 0.4816 mGy. IMPRESSION: Intraoperative fluoroscopy during right hip arthroplasty. Electronically Signed   By: Chadwick Colonel M.D.   On: 01/20/2024 20:14   DG C-Arm 1-60 Min-No Report Result Date: 01/20/2024 Fluoroscopy was utilized by the requesting physician.  No radiographic interpretation.   DG C-Arm 1-60 Min-No Report Result Date: 01/20/2024 Fluoroscopy was utilized by the requesting physician.  No radiographic interpretation.   DG HIP PORT UNILAT WITH PELVIS 1V RIGHT Result Date: 01/19/2024 CLINICAL  DATA:  Right hip pain. EXAM: DG HIP (WITH OR WITHOUT PELVIS) 1V PORT RIGHT COMPARISON:  None Available. FINDINGS: There is no evidence of acute hip fracture or dislocation. Degenerative changes are seen involving both hips in the form of joint space narrowing and acetabular sclerosis. Small radiopaque surgical/vascular coils are seen within the lower pelvis on the right. IMPRESSION: Degenerative changes without evidence of acute osseous abnormality. Electronically Signed   By: Virgle Grime M.D.   On: 01/19/2024 17:04   CT NO CHARGE Result Date: 01/19/2024 CLINICAL DATA:  Bony pelvis reconstructions were performed for evaluation of the right femur fracture at no charge to the patient. EXAM: CT ADDITIONAL VIEWS AT NO CHARGE TECHNIQUE: Bony reconstruction of the pelvis was performed from the multidetector CT imaging of the abdomen and pelvis performed earlier same day using the standard protocol following bolus administration of intravenous contrast. CONTRAST:  75mL OMNIPAQUE  IOHEXOL  350 MG/ML SOLN COMPARISON:  Same day CT abdomen and pelvis FINDINGS: Please see separately dictated CT abdomen and pelvis report for detailed findings of the soft tissue. Reconstruction of the pelvis again demonstrates nondisplaced fracture lucency through the right femoral neck. No acute dislocation. Irregular sclerotic focus within the right superior pubic ramus, likely bone island. IMPRESSION: Nondisplaced fracture lucency through the right femoral neck. No other pelvic fractures demonstrated. Electronically Signed   By: Limin  Xu M.D.   On: 01/19/2024 15:03   DG Knee Right Port Result Date: 01/19/2024 CLINICAL DATA:  Closed displaced fracture of right femoral neck. Right leg pain EXAM: PORTABLE RIGHT KNEE - 1-2 VIEW COMPARISON:  None Available. FINDINGS: Medial compartment hemiarthroplasty in expected alignment. No acute or periprosthetic fracture. Minor patellofemoral spurring. Faint chondrocalcinosis. Minimal joint  effusion. Peripheral vascular calcifications are seen. IMPRESSION:  1. Medial compartment hemiarthroplasty without complication. 2. Minor patellofemoral spurring. 3. Chondrocalcinosis. Electronically Signed   By: Chadwick Colonel M.D.   On: 01/19/2024 14:43   CT Head Wo Contrast Result Date: 01/19/2024 CLINICAL DATA:  Neck trauma due to fall. EXAM: CT HEAD WITHOUT CONTRAST CT CERVICAL SPINE WITHOUT CONTRAST TECHNIQUE: Multidetector CT imaging of the head and cervical spine was performed following the standard protocol without intravenous contrast. Multiplanar CT image reconstructions of the cervical spine were also generated. RADIATION DOSE REDUCTION: This exam was performed according to the departmental dose-optimization program which includes automated exposure control, adjustment of the mA and/or kV according to patient size and/or use of iterative reconstruction technique. COMPARISON:  None Available. FINDINGS: CT HEAD FINDINGS Brain: No evidence of acute infarction, hemorrhage, hydrocephalus, extra-axial collection or mass lesion/mass effect. Generalized brain atrophy Vascular: No hyperdense vessel or unexpected calcification. Skull: Normal. Negative for fracture or focal lesion. Sinuses/Orbits: No evidence of injury CT CERVICAL SPINE FINDINGS Alignment: No traumatic malalignment. Skull base and vertebrae: No acute fracture. No primary bone lesion or focal pathologic process. Generalized osteopenia. Soft tissues and spinal canal: No prevertebral fluid or swelling. No visible canal hematoma. Disc levels: Generalized degenerative endplate and facet spurring with multilevel ankylosis. Upper chest: No acute finding IMPRESSION: No evidence of acute intracranial or cervical spine injury. Electronically Signed   By: Ronnette Coke M.D.   On: 01/19/2024 11:52   CT Cervical Spine Wo Contrast Result Date: 01/19/2024 CLINICAL DATA:  Neck trauma due to fall. EXAM: CT HEAD WITHOUT CONTRAST CT CERVICAL SPINE WITHOUT  CONTRAST TECHNIQUE: Multidetector CT imaging of the head and cervical spine was performed following the standard protocol without intravenous contrast. Multiplanar CT image reconstructions of the cervical spine were also generated. RADIATION DOSE REDUCTION: This exam was performed according to the departmental dose-optimization program which includes automated exposure control, adjustment of the mA and/or kV according to patient size and/or use of iterative reconstruction technique. COMPARISON:  None Available. FINDINGS: CT HEAD FINDINGS Brain: No evidence of acute infarction, hemorrhage, hydrocephalus, extra-axial collection or mass lesion/mass effect. Generalized brain atrophy Vascular: No hyperdense vessel or unexpected calcification. Skull: Normal. Negative for fracture or focal lesion. Sinuses/Orbits: No evidence of injury CT CERVICAL SPINE FINDINGS Alignment: No traumatic malalignment. Skull base and vertebrae: No acute fracture. No primary bone lesion or focal pathologic process. Generalized osteopenia. Soft tissues and spinal canal: No prevertebral fluid or swelling. No visible canal hematoma. Disc levels: Generalized degenerative endplate and facet spurring with multilevel ankylosis. Upper chest: No acute finding IMPRESSION: No evidence of acute intracranial or cervical spine injury. Electronically Signed   By: Ronnette Coke M.D.   On: 01/19/2024 11:52   CT ABDOMEN PELVIS W CONTRAST Result Date: 01/19/2024 CLINICAL DATA:  Acute nonlocalized abdominal pain.  Trauma.  Fall EXAM: CT ABDOMEN AND PELVIS WITH CONTRAST TECHNIQUE: Multidetector CT imaging of the abdomen and pelvis was performed using the standard protocol following bolus administration of intravenous contrast. RADIATION DOSE REDUCTION: This exam was performed according to the departmental dose-optimization program which includes automated exposure control, adjustment of the mA and/or kV according to patient size and/or use of iterative  reconstruction technique. CONTRAST:  75mL OMNIPAQUE  IOHEXOL  350 MG/ML SOLN COMPARISON:  Pelvic CTA 06/07/2023 FINDINGS: Lower chest: There is some linear opacity lung bases likely scar or atelectasis. No pleural effusion. Coronary artery calcifications are seen. Small hiatal hernia. Hepatobiliary: Gallbladder is present with some question of dependent small stones. Liver has several small low-attenuation cystic areas which are  too small to completely characterize but statistically likely benign. No specific imaging follow-up. Patent portal vein. Pancreas: Global atrophy of the pancreas. Spleen: Spleen is slightly enlarged at 13 cm in AP dimension. Homogeneous enhancement. Adrenals/Urinary Tract: The adrenal glands are preserved. No right-sided enhancing renal mass. Tiny Bosniak 2 upper pole right-sided renal cystic focus. Mild atrophy left kidney. There is a lower pole left-sided renal stone identified measuring 13 mm. Separate punctate stone more caudal as well. Mild left-sided renal collecting system ectasia with the ectatic ureter extending down to the UVJ. No additional ureteral stones. Delayed imaging is only of the kidneys. Preserved contour to the urinary bladder. There is an enlarged prostate with dystrophic calcifications. Mass effect along the base of the bladder. Please correlate with patient's PSA and symptoms of BPH. Slight J hooking of the distal ureters. Stomach/Bowel: Large bowel has scattered colonic stool. Normal retrocecal appendix. Colonic diverticulosis identified diffusely greatest of the sigmoid colon. Stomach is underdistended. Small bowel is nondilated. Vascular/Lymphatic: Normal caliber IVC. There is some small upper abdominal retroperitoneal nodes identified, not pathologic by size criteria. Diffuse calcified plaque along the aorta and branch vessels. There are areas of short segment non flow min areas of dissection suggested along the distal abdominal aorta. This also some areas along the  common iliac arteries. Significant plaque along the common iliac arteries as well with areas of potential significant stenosis. Please correlate for any particular symptoms of claudication. Reproductive: Enlarged prostate once again with mass effect along the bladder and dystrophic calcifications. Please correlate for signs of BPH and the patient's PSA Other: Small fat containing inguinal hernias. No free intra-abdominal air or free fluid. Of note there is artifact as the patient's arms were scanned across the abdomen and pelvis. Musculoskeletal: Diffuse degenerative changes are seen along the spine and pelvis. Mixed areas ease lucency and sclerosis. There are several fractures identified. This includes a nondisplaced fracture of the left femoral neck best seen on coronal image 64 of series 11 image. Axial image 88 of series 7. This is not visible on the prior pelvis x-ray of 01/14/2024. Compression fracture of L4. Fracture line along the superior endplate which is compressed. There is also chest in of fracture lines extending along the lateral margins of the vertebral body. IMPRESSION: Compression fracture of L4. Nondisplaced right femoral neck fracture. These appear acute. Colonic diverticulosis. No bowel obstruction, free air or free fluid. Normal appendix. Distended gallbladder with some some small dependent stones suggested. Nonobstructing left-sided renal stone measuring 13 mm. Separate punctate stone. There is mild to moderate collecting system dilatation left diffusely down to the level of the bladder. No distal ureteral stones are seen. Etiology of this is uncertain. Please correlate for any known history. There was an ectatic ureter distally on the prior study of September 2024 but today peers increased from that exam. There is an enlarged prostate with mass effect along the bladder with dystrophic calcifications. Please correlate with the patient's PSA and signs of BPH. Small hiatal hernia. Extensive  vascular calcifications identified with potential areas of significant stenosis along the iliac vessels. Please correlate with symptoms. Mild splenic enlargement. Electronically Signed   By: Adrianna Horde M.D.   On: 01/19/2024 11:33   CT Thoracic Spine Wo Contrast Result Date: 01/19/2024 CLINICAL DATA:  Low back pain, trauma; Mid-back pain EXAM: CT THORACIC, AND LUMBAR SPINE WITHOUT CONTRAST TECHNIQUE: Multidetector CT imaging of the thoracic and lumbar spine was performed without intravenous contrast. Multiplanar CT image reconstructions were also generated. RADIATION  DOSE REDUCTION: This exam was performed according to the departmental dose-optimization program which includes automated exposure control, adjustment of the mA and/or kV according to patient size and/or use of iterative reconstruction technique. COMPARISON:  None Available. FINDINGS: CT THORACIC SPINE FINDINGS Alignment: Normal. Vertebrae: Vertebral body heights are maintained. No evidence of acute fracture. Osteopenia. Paraspinal and other soft tissues: No evidence of acute abnormality. Disc levels: Bridging osteophytes at multiple levels. CT LUMBAR SPINE FINDINGS Alignment: Grade 1 anterolisthesis of L4 on L5. Probably degenerative given degenerative changes at this level. Vertebrae: Acute fracture through the L4 superior endplate with visible lucency and approximately 20% height loss. Osteopenia. Paraspinal and other soft tissues: Please see same day CT of the abdomen/pelvis for intra-abdominal and intrapelvic evaluation. Disc levels: Severe lower lumbar facet arthropathy, greatest at L4-L5 where there is degenerative grade 1 anterolisthesis. Also disc bulging and ligamentum flavum thickening this level with at least mild canal stenosis. Moderate multilevel degenerative disc disease. L2 inferior endplate degenerative Schmorl's node. IMPRESSION: Acute L4 superior endplate fracture with 20% height loss. No bony retropulsion. Electronically Signed    By: Stevenson Elbe M.D.   On: 01/19/2024 11:29   CT L-SPINE NO CHARGE Result Date: 01/19/2024 CLINICAL DATA:  Low back pain, trauma; Mid-back pain EXAM: CT THORACIC, AND LUMBAR SPINE WITHOUT CONTRAST TECHNIQUE: Multidetector CT imaging of the thoracic and lumbar spine was performed without intravenous contrast. Multiplanar CT image reconstructions were also generated. RADIATION DOSE REDUCTION: This exam was performed according to the departmental dose-optimization program which includes automated exposure control, adjustment of the mA and/or kV according to patient size and/or use of iterative reconstruction technique. COMPARISON:  None Available. FINDINGS: CT THORACIC SPINE FINDINGS Alignment: Normal. Vertebrae: Vertebral body heights are maintained. No evidence of acute fracture. Osteopenia. Paraspinal and other soft tissues: No evidence of acute abnormality. Disc levels: Bridging osteophytes at multiple levels. CT LUMBAR SPINE FINDINGS Alignment: Grade 1 anterolisthesis of L4 on L5. Probably degenerative given degenerative changes at this level. Vertebrae: Acute fracture through the L4 superior endplate with visible lucency and approximately 20% height loss. Osteopenia. Paraspinal and other soft tissues: Please see same day CT of the abdomen/pelvis for intra-abdominal and intrapelvic evaluation. Disc levels: Severe lower lumbar facet arthropathy, greatest at L4-L5 where there is degenerative grade 1 anterolisthesis. Also disc bulging and ligamentum flavum thickening this level with at least mild canal stenosis. Moderate multilevel degenerative disc disease. L2 inferior endplate degenerative Schmorl's node. IMPRESSION: Acute L4 superior endplate fracture with 20% height loss. No bony retropulsion. Electronically Signed   By: Stevenson Elbe M.D.   On: 01/19/2024 11:29   DG Pelvis 1-2 Views Result Date: 01/15/2024 CLINICAL DATA:  Right hip pain.  Patient reports fall last week. EXAM: PELVIS - 1-2 VIEW  COMPARISON:  None Available. FINDINGS: The bones are subjectively under mineralized. The cortical margins of the bony pelvis are intact. No fracture. Pubic symphysis and sacroiliac joints are congruent. Mild sacroiliac degenerative change. Both femoral heads are well-seated in the respective acetabula. Bilateral hip joint space narrowing with chondrocalcinosis on the left. IMPRESSION: 1. No fracture of the pelvis. 2. Mild bilateral hip osteoarthritis with chondrocalcinosis on the left. Electronically Signed   By: Chadwick Colonel M.D.   On: 01/15/2024 11:17   DG Lumbar Spine 2-3 Views Result Date: 01/15/2024 CLINICAL DATA:  Lumbar spine pain. Low back and hip pain. Patient reports fall last week. EXAM: LUMBAR SPINE - 2-3 VIEW COMPARISON:  Reformats from abdominopelvic CT 02/15/2023 FINDINGS: Five non-rib-bearing lumbar vertebra.  Mild broad-based levo scoliotic curvature. The bones are subjectively under mineralized. Mild L4 superior endplate compression deformity with approximately 20% loss of height centrally. No posterior cortex involvement. Grade 1 anterolisthesis of L4 on L5, likely facet mediated. Anterior spurring at multiple levels with slight L4-L5 disc space narrowing. There is moderate L4-L5 and L5-S1 facet hypertrophy. Mild degenerative change of the sacroiliac joints IMPRESSION: 1. Mild L4 superior endplate compression deformity with approximately 20% loss of height centrally, possibly acute. This is new from 02/15/2023. 2. Moderate facet hypertrophy at L4-L5 and L5-S1. 3. Grade 1 anterolisthesis of L4 on L5, likely facet mediated. Electronically Signed   By: Chadwick Colonel M.D.   On: 01/15/2024 11:16    Labs:  Basic Metabolic Panel: Recent Labs  Lab 01/31/24 0656 02/04/24 0559  NA 135 135  K 3.4* 3.8  CL 103 102  CO2 24 27  GLUCOSE 153* 129*  BUN 19 21  CREATININE 0.75 0.91  CALCIUM  9.1 9.2    CBC: Recent Labs  Lab 01/31/24 0656 02/04/24 0559  WBC 5.4 4.0  HGB 10.8* 10.7*   HCT 33.0* 33.3*  MCV 95.7 95.7  PLT 293 356    CBG: No results for input(s): "GLUCAP" in the last 168 hours.  Brief HPI:   RAMEZ ARRONA is a 89 y.o. male ***   Hospital Course: COLETON WOON was admitted to rehab 01/24/2024 for inpatient therapies to consist of PT, ST and OT at least three hours five days a week. Past admission physiatrist, therapy team and rehab RN have worked together to provide customized collaborative inpatient rehab.   Blood pressures were monitored on TID basis and   Diabetes has been monitored with ac/hs CBG checks and SSI was use prn for tighter BS control.    Rehab course: During patient's stay in rehab weekly team conferences were held to monitor patient's progress, set goals and discuss barriers to discharge. At admission, patient required  He  has had improvement in activity tolerance, balance, postural control as well as ability to compensate for deficits. He has had improvement in functional use RUE/LUE  and RLE/LLE as well as improvement in awareness       Disposition:  Discharge disposition: 06-Home-Health Care Svc        Diet:  Special Instructions:   Allergies as of 02/05/2024       Reactions   Dilaudid [hydromorphone Hcl] Other (See Comments)   Agitation   Nizoral  [ketoconazole ] Rash   Sulfonamide Derivatives Rash        Medication List     STOP taking these medications    amLODipine  2.5 MG tablet Commonly known as: NORVASC    docusate sodium  100 MG capsule Commonly known as: COLACE   triamcinolone  cream 0.1 % Commonly known as: KENALOG        TAKE these medications    acetaminophen  650 MG CR tablet Commonly known as: TYLENOL  Take 2 tablets (1,300 mg total) by mouth every 8 (eight) hours as needed for pain. What changed:  when to take this reasons to take this   aspirin  EC 81 MG tablet Take 81 mg by mouth daily. Notes to patient: Can resume at home   DULoxetine  20 MG capsule Commonly known  as: CYMBALTA  TAKE 1 CAPSULE DAILY   HYDROcodone -acetaminophen  5-325 MG tablet Commonly known as: NORCO/VICODIN Take 1 tablet by mouth every 8 (eight) hours as needed for severe pain (pain score 7-10). What changed: when to take this Notes to patient: Limit to one  pill per day as needed for severe pain   lisinopril  5 MG tablet Commonly known as: ZESTRIL  Take 1 tablet (5 mg total) by mouth daily. What changed:  medication strength how much to take   melatonin 5 MG Tabs Take 1 tablet (5 mg total) by mouth at bedtime.   QUEtiapine 50 MG tablet Commonly known as: SEROQUEL Take 1 tablet (50 mg total) by mouth at bedtime.   rivaroxaban  20 MG Tabs tablet Commonly known as: XARELTO  Take 10 mg by mouth daily with supper.   rosuvastatin  10 MG tablet Commonly known as: CRESTOR  TAKE 1 TABLET DAILY What changed: when to take this   senna-docusate 8.6-50 MG tablet Commonly known as: Senokot-S Take 2 tablets by mouth daily. Notes to patient: You have been taking this intermittently. Can use up to twice a day.   VITAMIN B-12 PO Take 1 tablet by mouth daily.   VITAMIN D-3 PO Take 1 capsule by mouth daily with supper.        Follow-up Information     Almira Jaeger, MD Follow up.   Specialty: Family Medicine Why: Call in 1-2 days for post hospital follow up Contact information: [address] [place] [address] [phone]         Celia Coles, MD Follow up.   Specialty: Physical Medicine and Rehabilitation Why: As needed Contact information: [address] [place] [address] [phone]         Adonica Hoose, MD Follow up.   Specialty: Orthopedic Surgery Why: Call in 1-2 days for post hospital follow up Contact information: [address] [place] [address] [phone]                 Signed: Zelda Hickman 02/05/2024, 8:41 AM

## 2024-02-05 NOTE — Progress Notes (Signed)
 Inpatient Rehabilitation Discharge Medication Review by a Pharmacist  A complete drug regimen review was completed for this patient to identify any potential clinically significant medication issues.  High Risk Drug Classes Is patient taking? Indication by Medication  Antipsychotic Yes Quetiapine - agitation  Anticoagulant Yes Rivaroxaban  - VTE ppx  Antibiotic No   Opioid Yes Norco prn pain  Antiplatelet No   Hypoglycemics/insulin  No   Vasoactive Medication Yes Lisinopril  - BP  Chemotherapy No   Other Yes Rosuvastatin  - HLD Duloxetine  - Mood     Type of Medication Issue Identified Description of Issue Recommendation(s)  Drug Interaction(s) (clinically significant)     Duplicate Therapy     Allergy     No Medication Administration End Date     Incorrect Dose     Additional Drug Therapy Needed     Significant med changes from prior encounter (inform family/care partners about these prior to discharge).    Other       Clinically significant medication issues were identified that warrant physician communication and completion of prescribed/recommended actions by midnight of the next day:  No  Name of provider notified for urgent issues identified:   Provider Method of Notification:     Pharmacist comments:   Time spent performing this drug regimen review (minutes):  20 minutes   Thank you. Lennice Quivers, PharmD

## 2024-02-05 NOTE — Progress Notes (Signed)
 PROGRESS NOTE   Subjective/Complaints:  Asking about staples- to be removed or not.  Asking about handicapped placard-  Had a pretty good night- no confusion- wants to go home with Seorquel prn- needs pain meds for 7 days ROS: Negative except for HPI   Pt denies SOB, abd pain, CP, N/V/C/D, and vision changes  L elbow swelling    Objective:   No results found. Recent Labs    02/04/24 0559  WBC 4.0  HGB 10.7*  HCT 33.3*  PLT 356    Recent Labs    02/04/24 0559  NA 135  K 3.8  CL 102  CO2 27  GLUCOSE 129*  BUN 21  CREATININE 0.91  CALCIUM  9.2     Intake/Output Summary (Last 24 hours) at 02/05/2024 0919 Last data filed at 02/04/2024 2000 Gross per 24 hour  Intake 720 ml  Output 400 ml  Net 320 ml         Physical Exam: Vital Signs Blood pressure (!) 151/61, pulse 80, temperature 98 F (36.7 C), temperature source Oral, resp. rate 16, height 6\' 2"  (1.88 m), weight 77.5 kg, SpO2 98%.     General: awake, alert, appropriate, just woke up; NAD HENT: conjugate gaze; oropharynx moist CV: regular rate and irregular rhythm; no JVD Pulmonary: CTA B/L; no W/R/R- good air movement GI: soft, NT, ND, (+)BS Psychiatric: appropriate- sleepy Neurological: Ox3   MSK-Surgical dressing 2/3 off-staples still intact- will have nursing remove    Cervical back: Neck supple. No tenderness.     Comments: Surgical dressing on right anterior hip. RLE limited by pain.  Stable, +left elbow swelling   Ue's 5-/5 LLE 5-/5 throughout LLE- HF 2/5- limited by pain- KE- "Cannot do" due to pain; DF/PF 5-/5 EHL 4+/5  Skin:    General: Skin is warm and dry.     Comments: Anterior R hip surgical dressing moderate swelling, no surrounding erythema or drainage noted  Neurological:     Mental Status: He is alert and oriented to person, place, and time.     Comments: Intact to light touch in all 4 extremities, stable  5/25  Assessment/Plan: 1. Functional deficits which require 3+ hours per day of interdisciplinary therapy in a comprehensive inpatient rehab setting. Physiatrist is providing close team supervision and 24 hour management of active medical problems listed below. Physiatrist and rehab team continue to assess barriers to discharge/monitor patient progress toward functional and medical goals  Care Tool:  Bathing    Body parts bathed by patient: Left arm, Right arm, Chest, Abdomen, Front perineal area, Buttocks, Right upper leg, Left upper leg, Right lower leg, Left lower leg, Face   Body parts bathed by helper: Buttocks, Right upper leg, Left upper leg Body parts n/a: Right lower leg, Left lower leg   Bathing assist Assist Level: Set up assist     Upper Body Dressing/Undressing Upper body dressing   What is the patient wearing?: Pull over shirt    Upper body assist Assist Level: Independent    Lower Body Dressing/Undressing Lower body dressing      What is the patient wearing?: Underwear/pull up, Pants     Lower body assist  Assist for lower body dressing: Independent with assitive device     Toileting Toileting    Toileting assist Assist for toileting: Independent with assistive device     Transfers Chair/bed transfer  Transfers assist     Chair/bed transfer assist level: Supervision/Verbal cueing     Locomotion Ambulation   Ambulation assist   Ambulation activity did not occur: Safety/medical concerns  Assist level: Supervision/Verbal cueing Assistive device: Walker-rolling Max distance: 55   Walk 10 feet activity   Assist  Walk 10 feet activity did not occur: Safety/medical concerns  Assist level: Supervision/Verbal cueing Assistive device: Walker-rolling   Walk 50 feet activity   Assist Walk 50 feet with 2 turns activity did not occur: Safety/medical concerns  Assist level: Supervision/Verbal cueing Assistive device: Walker-rolling     Walk 150 feet activity   Assist Walk 150 feet activity did not occur: Safety/medical concerns (pain)         Walk 10 feet on uneven surface  activity   Assist Walk 10 feet on uneven surfaces activity did not occur: Safety/medical concerns         Wheelchair     Assist Is the patient using a wheelchair?: Yes Type of Wheelchair: Manual Wheelchair activity did not occur: Safety/medical concerns  Wheelchair assist level: Supervision/Verbal cueing Max wheelchair distance: 150    Wheelchair 50 feet with 2 turns activity    Assist    Wheelchair 50 feet with 2 turns activity did not occur: Safety/medical concerns   Assist Level: Supervision/Verbal cueing   Wheelchair 150 feet activity     Assist  Wheelchair 150 feet activity did not occur: Safety/medical concerns   Assist Level: Supervision/Verbal cueing   Blood pressure (!) 151/61, pulse 80, temperature 98 F (36.7 C), temperature source Oral, resp. rate 16, height 6\' 2"  (1.88 m), weight 77.5 kg, SpO2 98%.  Medical Problem List and Plan: 1. Functional deficits secondary to R femoral neck fx secondary to fall with R hemiarthroplasty             -patient may  shower- cover surgical incision             -ELOS/Goals: 7-10 days supervision             D/c set for 5/27  D/c today Needs handicapped placard- at d/c  Going via transportation 2.  Antithrombotics: -DVT/anticoagulation:  Pharmaceutical: Xarelto              -antiplatelet therapy: N/A 3.Recent L4 endplate Fx/ Pain Management after hip fx: On Cymbalta . Continue  hydrocodone  prn.    5/16- not taking Norco right now- and still confused last night- wife has been refusing Tylenol  500 mg scheduled just taking 650 mg prn             --LSO ordered for support  5/17 hydrocodone  dose adjusted.  Patient was getting 5 mg dose for moderate pain.  For severe pain will adjust to 1 tab 7.5 and monitor and try this but if not controlled could consider adjust to  10 mg dose.  5/19- pt reports pain doing well- better- so doesn't want change ot meds at this time  5/20 -5/21- pain doing a little better- con't regimen  5/23- having more pain this AM, but hasn't had Norco since 5/22 at 2pm, so not the cause of confusion overnight  5/26- 5/27- will need to take Norco home- for 7 days- Taking Norco ~1x/day.  4. Mood/Behavior/Sleep: LCSW to follow for evaluation and support.              -  antipsychotic agents: N/A 5. Neuropsych/cognition: This patient is capable of making decisions on his own behalf during day- sundowning at night. 6. Skin/Wound Care: Routine pressure relief measures.  7. Fluids/Electrolytes/Nutrition: Monitor I/O. Check CMET in am              --encourage intake  8. Pre-renal azotemia: BUN/SCr 18/0.85 at admission-->26/0.91-->18/0.82  5/16- BUN 13 and Cr 0.72- doing better             --encourage fluid intake  -Recheck tomorrow  5/19- 16 and 0.82- doing better-  9. Acute blood loss anemia: Monitor for signs of bleeding. Recheck CBC in am             --Hgb down from 14.0-->10.1 likely due to post op loss.   5/16- Hb up to 10.9 today  5/19- Hb 11.0  5/22- Hb 10.8 10. Recurrent hypokalemia: Improved after supplement. Recheck in am.   5/22- K+ 3.4- will replete 40 mEq x1- and recheck Monday 11. Constipation: Sorbitol  today. Increase Senna S to 2 tabs bid.    5/16- no results- ordered soap suds enema to occur as soon as possible and not interfere with therapy.   5/23- LBM x2 yesterday  5/26- LBM 5/24 12. Diet controlled DM: Continue CM/make heart healthy diet.  Fair control, continue to monitor   5/19- CBGs overall controlled, but spiked yesterday at noon- will monitor-   5/20- continues to spike at noon- likely Ensure- will monitor  5/22- stopped checking BG's since not on meds CBG (last 3)  No results for input(s): "GLUCAP" in the last 72 hours.   13. CAD/SSS/A fib: Monitor HR TID. On Xarelto . Heart healthy restrictions added per  requrest. 14. HTN/Orthostatic hypotension: Monitor BP TID-on lisinopril  and amlodipine .   5/16- BP elevated to 160s this AM- but normally 130s-140s- con't to monitor trend  5/17 BP intermittently elevated although has had some issues with orthostatic hypotension., abd binder/TEDS for [facility]  5/18 -5/19- BP stable this morning, continue to monitor trend  5/20- had dizzy episode this AM, where sounds like BP dropped- didn't have syncope, etc, but was c/o dizziness- will monitor  5/22- Pt's BP 102/59 this AM- will monitor for orthostasis  5/23-5/27 BP doing better- 120s-130s' systolic except once 151 this AM-con't regimen    02/05/2024    5:00 AM 02/04/2024    8:00 PM 02/04/2024    2:45 PM  Vitals with BMI  Systolic 151 120 191  Diastolic 61 57 47  Pulse 80 73 77    15. Hx BPH: Toilet every 4 hours while awake.  16. Confusion overnight and urinary frequency/urgency  5/16- will check U/A and Cx  UTI treatment started, confusion appears to be improved 17. UTI- Proteus/Klebsiella Pneumo  -5/17 Start keflex , monitor cultures  5/19- based on Cx's that came back, Keflex  should be appropriate - sensitive to both Klebsiella pneumo; and Proteus. 5/21- will check with pharmacy to make sure is on correct ABX and also to make sure isn't on anything that would confuse pt.   5/22- Spoke with pharmacy- don't see reason meds or UTI could be causing confusion- on correct ABX- did maintain for 2 more days.  5/27- done with Po ABX 18. Poor appetite-  5/20- will order Ensure 2x/day between meals  Discussed that he is enjoying Ensure, continue  19. R foot pain  5/20- mild swelling on dorsum of foot- that's TTP- will monitor- could be swelling from R hip working down?  5/21- less pain this AM  5/22- no complaints of pain this AM  20. Sundowning  5/22- no reason from meds to be causing sundowning- will start Seroquel 25 mg at bedtime since causes so much agitation in pt and distress.   5/23- 1 bad episode  last night- will increase Seroquel to 50 mg at bedtime- if Keflex  is adding to confusion (which doesn't make sense since pt having clear sensorium during day), then it will be d/c'd today.   Continue seroquel, scheduled melatonin  5/26- pt doing MUCH better with Seroquel-   5/27- will send on Seroquel PRN- much less likely will need at home, since in familiar environment.  21. Left elbow swelling: continue ice  22. Fatigue: vitamin D supplement started, helpful, continue  23. Hypotension: d/c amlodipine    I spent a total of  36  minutes on total care today- >50% coordination of care- due to  D/w PA about handicapped placard and meds for d/c- as well as answered questions from pt and wife. Also spoke with nursing about pt d/c.   The patient is medically ready for discharge to home and will not need follow-up with [facility]. In addition, they will need to follow up with their PCP, Orthopedics.    LOS: 12 days A FACE TO FACE EVALUATION WAS PERFORMED  Shaquoya Cosper 02/05/2024, 9:19 AM

## 2024-02-05 NOTE — Plan of Care (Signed)
  Problem: Consults Goal: RH GENERAL PATIENT EDUCATION Description: See Patient Education module for education specifics. Outcome: Progressing   Problem: RH BOWEL ELIMINATION Goal: RH STG MANAGE BOWEL WITH ASSISTANCE Description: STG Manage Bowel with supervision  Assistance. Outcome: Progressing   Problem: RH BLADDER ELIMINATION Goal: RH STG MANAGE BLADDER WITH ASSISTANCE Description: STG Manage Bladder With  supervision Assistance Outcome: Progressing   Problem: RH SKIN INTEGRITY Goal: RH STG SKIN FREE OF INFECTION/BREAKDOWN Description: Manage skin free of infection/breakdown with supervision assistance Outcome: Progressing   Problem: RH SAFETY Goal: RH STG ADHERE TO SAFETY PRECAUTIONS W/ASSISTANCE/DEVICE Description: STG Adhere to Safety Precautions With supervision Assistance/Device. Outcome: Progressing   Problem: RH PAIN MANAGEMENT Goal: RH STG PAIN MANAGED AT OR BELOW PT'S PAIN GOAL Description: <4 w/ prns Outcome: Progressing

## 2024-02-05 NOTE — Progress Notes (Signed)
 Inpatient Rehabilitation Care Coordinator Discharge Note   Patient Details  Name: Sean Lane MRN: [id number] Date of Birth: 1928/08/29   Discharge location: D/c to home  Length of Stay: 11 days  Discharge activity level: w/c supervision level  Home/community participation: Limited  Patient response IE:PPIRJJ Literacy - How often do you need to have someone help you when you read instructions, pamphlets, or other written material from your doctor or pharmacy?: Rarely  Patient response OA:CZYSAY Isolation - How often do you feel lonely or isolated from those around you?: Never  Services provided included: MD, RD, PT, OT, RN, TR, Pharmacy, Neuropsych, SW, CM  Financial Services:  Field seismologist Utilized: Medicare    Choices offered to/list presented to: pt  Follow-up services arranged:  Home Health, DME Home Health Agency: [facility] for PT/OT    DME : [employer] for 3in1 BSC, shower seat with back and w/c    Patient response to transportation need: Is the patient able to respond to transportation needs?: Yes In the past 12 months, has lack of transportation kept you from medical appointments or from getting medications?: No In the past 12 months, has lack of transportation kept you from meetings, work, or from getting things needed for daily living?: No   Patient/Family verbalized understanding of follow-up arrangements:  Yes  Individual responsible for coordination of the follow-up plan: contact pt or his wife  Confirmed correct DME delivered: Rennis Case 02/05/2024    Comments (or additional information):  Summary of Stay    Date/Time Discharge Planning CSW  01/28/24 1504 D/C home with wife who is blind in left eye and does not drive. 8ste bil rails in front entrance, 3 ste garage but 13 steps to main living area. DBS  01/28/24 0920 D/C home with wife who is blind in left eye and does not drive. DBS       Jcion Buddenhagen A Brendolyn Callas

## 2024-02-05 NOTE — Progress Notes (Signed)
 Patient ID: Sean Lane, male   DOB: 1927/10/29, 89 y.o.   MRN: [id number]  Attending reports that he would like a handicap placard.   Handicap placard provided to pt.   Norval Been, MSW, LCSW Office: [phone] Cell: [phone] Fax: [phone]

## 2024-02-05 NOTE — Progress Notes (Signed)
 Staples removed from right hip. Steri strips applied.

## 2024-02-06 ENCOUNTER — Ambulatory Visit: Admitting: Family Medicine

## 2024-02-06 ENCOUNTER — Telehealth: Payer: Self-pay

## 2024-02-06 DIAGNOSIS — G4700 Insomnia, unspecified: Secondary | ICD-10-CM | POA: Diagnosis not present

## 2024-02-06 DIAGNOSIS — R350 Frequency of micturition: Secondary | ICD-10-CM | POA: Diagnosis not present

## 2024-02-06 DIAGNOSIS — N390 Urinary tract infection, site not specified: Secondary | ICD-10-CM | POA: Diagnosis not present

## 2024-02-06 DIAGNOSIS — M5030 Other cervical disc degeneration, unspecified cervical region: Secondary | ICD-10-CM | POA: Diagnosis not present

## 2024-02-06 DIAGNOSIS — F05 Delirium due to known physiological condition: Secondary | ICD-10-CM | POA: Diagnosis not present

## 2024-02-06 DIAGNOSIS — M109 Gout, unspecified: Secondary | ICD-10-CM | POA: Diagnosis not present

## 2024-02-06 DIAGNOSIS — R630 Anorexia: Secondary | ICD-10-CM | POA: Diagnosis not present

## 2024-02-06 DIAGNOSIS — E876 Hypokalemia: Secondary | ICD-10-CM | POA: Diagnosis not present

## 2024-02-06 DIAGNOSIS — R3915 Urgency of urination: Secondary | ICD-10-CM | POA: Diagnosis not present

## 2024-02-06 DIAGNOSIS — I2510 Atherosclerotic heart disease of native coronary artery without angina pectoris: Secondary | ICD-10-CM | POA: Diagnosis not present

## 2024-02-06 DIAGNOSIS — S72001D Fracture of unspecified part of neck of right femur, subsequent encounter for closed fracture with routine healing: Secondary | ICD-10-CM | POA: Diagnosis not present

## 2024-02-06 DIAGNOSIS — M62838 Other muscle spasm: Secondary | ICD-10-CM | POA: Diagnosis not present

## 2024-02-06 DIAGNOSIS — N401 Enlarged prostate with lower urinary tract symptoms: Secondary | ICD-10-CM | POA: Diagnosis not present

## 2024-02-06 DIAGNOSIS — I495 Sick sinus syndrome: Secondary | ICD-10-CM | POA: Diagnosis not present

## 2024-02-06 DIAGNOSIS — S32048D Other fracture of fourth lumbar vertebra, subsequent encounter for fracture with routine healing: Secondary | ICD-10-CM | POA: Diagnosis not present

## 2024-02-06 DIAGNOSIS — K5900 Constipation, unspecified: Secondary | ICD-10-CM | POA: Diagnosis not present

## 2024-02-06 DIAGNOSIS — M79671 Pain in right foot: Secondary | ICD-10-CM | POA: Diagnosis not present

## 2024-02-06 DIAGNOSIS — I4891 Unspecified atrial fibrillation: Secondary | ICD-10-CM | POA: Diagnosis not present

## 2024-02-06 DIAGNOSIS — M51369 Other intervertebral disc degeneration, lumbar region without mention of lumbar back pain or lower extremity pain: Secondary | ICD-10-CM | POA: Diagnosis not present

## 2024-02-06 DIAGNOSIS — E1159 Type 2 diabetes mellitus with other circulatory complications: Secondary | ICD-10-CM | POA: Diagnosis not present

## 2024-02-06 DIAGNOSIS — B964 Proteus (mirabilis) (morganii) as the cause of diseases classified elsewhere: Secondary | ICD-10-CM | POA: Diagnosis not present

## 2024-02-06 DIAGNOSIS — I10 Essential (primary) hypertension: Secondary | ICD-10-CM | POA: Diagnosis not present

## 2024-02-06 DIAGNOSIS — I4892 Unspecified atrial flutter: Secondary | ICD-10-CM | POA: Diagnosis not present

## 2024-02-06 DIAGNOSIS — B961 Klebsiella pneumoniae [K. pneumoniae] as the cause of diseases classified elsewhere: Secondary | ICD-10-CM | POA: Diagnosis not present

## 2024-02-06 DIAGNOSIS — H903 Sensorineural hearing loss, bilateral: Secondary | ICD-10-CM | POA: Diagnosis not present

## 2024-02-06 NOTE — Telephone Encounter (Signed)
 Called and lm on [place] confidential vm with VO.  Copied from CRM [id number]. Topic: Clinical - Home Health Verbal Orders >> Feb 06, 2024  1:05 PM [place] A wrote: Caller/Agency: [facility] Callback Number: [phone] Confidential Voicemail Service Requested: Physical Therapy Frequency: 1 week 1, 2 week 3, 1 week 4 Any new concerns about the patient? No

## 2024-02-08 ENCOUNTER — Telehealth: Payer: Self-pay | Admitting: Family Medicine

## 2024-02-08 ENCOUNTER — Other Ambulatory Visit: Payer: Self-pay

## 2024-02-08 DIAGNOSIS — E1159 Type 2 diabetes mellitus with other circulatory complications: Secondary | ICD-10-CM | POA: Diagnosis not present

## 2024-02-08 DIAGNOSIS — I495 Sick sinus syndrome: Secondary | ICD-10-CM | POA: Diagnosis not present

## 2024-02-08 DIAGNOSIS — S32040A Wedge compression fracture of fourth lumbar vertebra, initial encounter for closed fracture: Secondary | ICD-10-CM

## 2024-02-08 DIAGNOSIS — Z96641 Presence of right artificial hip joint: Secondary | ICD-10-CM | POA: Diagnosis not present

## 2024-02-08 DIAGNOSIS — Z471 Aftercare following joint replacement surgery: Secondary | ICD-10-CM | POA: Diagnosis not present

## 2024-02-08 DIAGNOSIS — I4892 Unspecified atrial flutter: Secondary | ICD-10-CM | POA: Diagnosis not present

## 2024-02-08 DIAGNOSIS — I2510 Atherosclerotic heart disease of native coronary artery without angina pectoris: Secondary | ICD-10-CM | POA: Diagnosis not present

## 2024-02-08 DIAGNOSIS — I4891 Unspecified atrial fibrillation: Secondary | ICD-10-CM | POA: Diagnosis not present

## 2024-02-08 DIAGNOSIS — S72001D Fracture of unspecified part of neck of right femur, subsequent encounter for closed fracture with routine healing: Secondary | ICD-10-CM | POA: Diagnosis not present

## 2024-02-08 NOTE — Telephone Encounter (Signed)
 Patients wife [name] called and said we saw patient for a compression fracture in his back on the 5th of may. He fell again after that and broke his hip and they just got out of [facility]. Today he saw [facility] and they wanted him to reach out to Dr.smith to see what to do next. His hip is doing very well but she says husband is still in a lot of pain from his lower back and they are wondering what they should do. She says he has strong pain medication already. They are wondering what is best to do as a next step. Please advise

## 2024-02-11 ENCOUNTER — Encounter: Payer: Self-pay | Admitting: Family Medicine

## 2024-02-11 ENCOUNTER — Other Ambulatory Visit: Payer: Self-pay

## 2024-02-11 DIAGNOSIS — I4891 Unspecified atrial fibrillation: Secondary | ICD-10-CM | POA: Diagnosis not present

## 2024-02-11 DIAGNOSIS — I2510 Atherosclerotic heart disease of native coronary artery without angina pectoris: Secondary | ICD-10-CM | POA: Diagnosis not present

## 2024-02-11 DIAGNOSIS — I4892 Unspecified atrial flutter: Secondary | ICD-10-CM | POA: Diagnosis not present

## 2024-02-11 DIAGNOSIS — S72001D Fracture of unspecified part of neck of right femur, subsequent encounter for closed fracture with routine healing: Secondary | ICD-10-CM | POA: Diagnosis not present

## 2024-02-11 DIAGNOSIS — I495 Sick sinus syndrome: Secondary | ICD-10-CM | POA: Diagnosis not present

## 2024-02-11 DIAGNOSIS — E1159 Type 2 diabetes mellitus with other circulatory complications: Secondary | ICD-10-CM | POA: Diagnosis not present

## 2024-02-11 MED ORDER — HYDROCODONE-ACETAMINOPHEN 5-325 MG PO TABS: 1.0000 | ORAL_TABLET | Freq: Four times a day (QID) | ORAL | 0 refills | Status: DC | PRN

## 2024-02-11 NOTE — Progress Notes (Signed)
 Sent in refill to help with fracture pain

## 2024-02-13 ENCOUNTER — Telehealth

## 2024-02-13 DIAGNOSIS — S72001D Fracture of unspecified part of neck of right femur, subsequent encounter for closed fracture with routine healing: Secondary | ICD-10-CM | POA: Diagnosis not present

## 2024-02-13 DIAGNOSIS — I4891 Unspecified atrial fibrillation: Secondary | ICD-10-CM | POA: Diagnosis not present

## 2024-02-13 DIAGNOSIS — I495 Sick sinus syndrome: Secondary | ICD-10-CM | POA: Diagnosis not present

## 2024-02-13 DIAGNOSIS — E1159 Type 2 diabetes mellitus with other circulatory complications: Secondary | ICD-10-CM | POA: Diagnosis not present

## 2024-02-13 DIAGNOSIS — I2510 Atherosclerotic heart disease of native coronary artery without angina pectoris: Secondary | ICD-10-CM | POA: Diagnosis not present

## 2024-02-13 DIAGNOSIS — I4892 Unspecified atrial flutter: Secondary | ICD-10-CM | POA: Diagnosis not present

## 2024-02-14 ENCOUNTER — Other Ambulatory Visit: Payer: Self-pay

## 2024-02-14 MED ORDER — SENNOSIDES-DOCUSATE SODIUM 8.6-50 MG PO TABS: 2.0000 | ORAL_TABLET | Freq: Every day | ORAL | 0 refills | Status: DC

## 2024-02-14 MED ORDER — MELATONIN 5 MG PO TABS: 5.0000 mg | ORAL_TABLET | Freq: Every day | ORAL | 0 refills | Status: DC

## 2024-02-14 MED ORDER — QUETIAPINE FUMARATE 50 MG PO TABS: 50.0000 mg | ORAL_TABLET | Freq: Every day | ORAL | 0 refills | Status: DC

## 2024-02-15 DIAGNOSIS — I495 Sick sinus syndrome: Secondary | ICD-10-CM | POA: Diagnosis not present

## 2024-02-15 DIAGNOSIS — I4891 Unspecified atrial fibrillation: Secondary | ICD-10-CM | POA: Diagnosis not present

## 2024-02-15 DIAGNOSIS — I4892 Unspecified atrial flutter: Secondary | ICD-10-CM | POA: Diagnosis not present

## 2024-02-15 DIAGNOSIS — I2510 Atherosclerotic heart disease of native coronary artery without angina pectoris: Secondary | ICD-10-CM | POA: Diagnosis not present

## 2024-02-15 DIAGNOSIS — S72001D Fracture of unspecified part of neck of right femur, subsequent encounter for closed fracture with routine healing: Secondary | ICD-10-CM | POA: Diagnosis not present

## 2024-02-15 DIAGNOSIS — E1159 Type 2 diabetes mellitus with other circulatory complications: Secondary | ICD-10-CM | POA: Diagnosis not present

## 2024-02-18 DIAGNOSIS — I2510 Atherosclerotic heart disease of native coronary artery without angina pectoris: Secondary | ICD-10-CM | POA: Diagnosis not present

## 2024-02-18 DIAGNOSIS — I4891 Unspecified atrial fibrillation: Secondary | ICD-10-CM | POA: Diagnosis not present

## 2024-02-18 DIAGNOSIS — S72001D Fracture of unspecified part of neck of right femur, subsequent encounter for closed fracture with routine healing: Secondary | ICD-10-CM | POA: Diagnosis not present

## 2024-02-18 DIAGNOSIS — I4892 Unspecified atrial flutter: Secondary | ICD-10-CM | POA: Diagnosis not present

## 2024-02-18 DIAGNOSIS — I495 Sick sinus syndrome: Secondary | ICD-10-CM | POA: Diagnosis not present

## 2024-02-18 DIAGNOSIS — E1159 Type 2 diabetes mellitus with other circulatory complications: Secondary | ICD-10-CM | POA: Diagnosis not present

## 2024-02-20 ENCOUNTER — Encounter: Payer: Self-pay | Admitting: Family Medicine

## 2024-02-20 ENCOUNTER — Inpatient Hospital Stay: Admitting: Family Medicine

## 2024-02-20 ENCOUNTER — Ambulatory Visit (INDEPENDENT_AMBULATORY_CARE_PROVIDER_SITE_OTHER): Admitting: Family Medicine

## 2024-02-20 VITALS — BP 120/70 | HR 89 | Temp 97.8°F | Ht 74.0 in | Wt 156.8 lb

## 2024-02-20 DIAGNOSIS — R197 Diarrhea, unspecified: Secondary | ICD-10-CM | POA: Diagnosis not present

## 2024-02-20 DIAGNOSIS — I2510 Atherosclerotic heart disease of native coronary artery without angina pectoris: Secondary | ICD-10-CM | POA: Diagnosis not present

## 2024-02-20 DIAGNOSIS — E1159 Type 2 diabetes mellitus with other circulatory complications: Secondary | ICD-10-CM | POA: Diagnosis not present

## 2024-02-20 DIAGNOSIS — I4892 Unspecified atrial flutter: Secondary | ICD-10-CM

## 2024-02-20 DIAGNOSIS — S32040D Wedge compression fracture of fourth lumbar vertebra, subsequent encounter for fracture with routine healing: Secondary | ICD-10-CM

## 2024-02-20 DIAGNOSIS — I4891 Unspecified atrial fibrillation: Secondary | ICD-10-CM | POA: Diagnosis not present

## 2024-02-20 DIAGNOSIS — I495 Sick sinus syndrome: Secondary | ICD-10-CM | POA: Diagnosis not present

## 2024-02-20 DIAGNOSIS — D649 Anemia, unspecified: Secondary | ICD-10-CM

## 2024-02-20 DIAGNOSIS — I10 Essential (primary) hypertension: Secondary | ICD-10-CM | POA: Diagnosis not present

## 2024-02-20 DIAGNOSIS — E785 Hyperlipidemia, unspecified: Secondary | ICD-10-CM | POA: Diagnosis not present

## 2024-02-20 DIAGNOSIS — Z8781 Personal history of (healed) traumatic fracture: Secondary | ICD-10-CM | POA: Diagnosis not present

## 2024-02-20 DIAGNOSIS — S72001D Fracture of unspecified part of neck of right femur, subsequent encounter for closed fracture with routine healing: Secondary | ICD-10-CM | POA: Diagnosis not present

## 2024-02-20 MED ORDER — HYDROCODONE-ACETAMINOPHEN 5-325 MG PO TABS: 1.0000 | ORAL_TABLET | Freq: Four times a day (QID) | ORAL | 0 refills | Status: DC | PRN

## 2024-02-20 NOTE — Progress Notes (Addendum)
 Phone [phone] In person visit   Subjective:   Sean Lane is a 89 y.o. year old very pleasant male patient who presents for/with See problem oriented charting Chief Complaint  Patient presents with   hosp f/u    Pt here for hosp f/u due to falls    Past Medical History-  Patient Active Problem List   Diagnosis Date Noted   Memory change 11/09/2022    Priority: High   CAD S/P percutaneous coronary angioplasty 10/26/2016    Priority: High   Type 2 diabetes mellitus with CAD(HCC) 12/14/2012    Priority: High   Atrial flutter (HCC) 10/03/2011    Priority: High   CAD (coronary artery disease) 12/17/2007    Priority: High   SINUS BRADYCARDIA 07/20/2010    Priority: Medium    BPH associated with nocturia 06/08/2009    Priority: Medium    DIZZINESS 12/15/2008    Priority: Medium    Hyperlipidemia 04/02/2007    Priority: Medium    Essential hypertension 04/02/2007    Priority: Medium    Diverticulitis of colon 11/22/2015    Priority: Low   Nephrolithiasis 05/07/2014    Priority: Low   Gout attack 12/14/2012    Priority: Low   Hematuria 03/28/2012    Priority: Low   RBBB 12/28/2008    Priority: Low   GERD 12/15/2008    Priority: Low   LOSS, CONDUCTIVE HEARING, COMBINED TYPE 04/16/2007    Priority: Low   Allergic rhinitis 04/02/2007    Priority: Low   Bilateral wrist pain 05/18/2021    Priority: 1.   Arthritis of carpometacarpal [facility]) joint of both thumbs 05/03/2021    Priority: 1.   Left rotator cuff tear 12/09/2020    Priority: 1.   Degenerative disc disease, lumbar 07/21/2020    Priority: 1.   Nonallopathic lesion of sacral region 12/17/2019    Priority: 1.   Nonallopathic lesion of lumbosacral region 12/17/2019    Priority: 1.   Spasm of left piriformis muscle 06/27/2019    Priority: 1.   Degenerative disc disease, cervical 05/28/2019    Priority: 1.   Nonallopathic lesion of thoracic region 05/28/2019    Priority: 1.   Orthostatic  hypertension 02/05/2024   Delirium due to general medical condition 02/05/2024   Insomnia 02/05/2024   Fracture, proximal femur, right, sequela 01/24/2024   Femur fracture (HCC) 01/19/2024   Fracture of femoral neck, right, closed (HCC) 01/19/2024   Sensorineural hearing loss (SNHL) of both ears 12/01/2022    Medications- reviewed and updated Current Outpatient Medications  Medication Sig Dispense Refill   aspirin  EC 81 MG tablet Take 81 mg by mouth daily.     Cholecalciferol  (VITAMIN D -3 PO) Take 1 capsule by mouth daily with supper.     Cyanocobalamin  (VITAMIN B-12 PO) Take 1 tablet by mouth daily.     DULoxetine  (CYMBALTA ) 20 MG capsule TAKE 1 CAPSULE DAILY 90 capsule 1   HYDROcodone -acetaminophen  (NORCO/VICODIN) 5-325 MG tablet Take 1 tablet by mouth every 6 (six) hours as needed for severe pain (pain score 7-10) (pain from femur fracture). 20 tablet 0   rosuvastatin  (CRESTOR ) 10 MG tablet TAKE 1 TABLET DAILY 90 tablet 3   No current facility-administered medications for this visit.     Objective:  BP 120/70   Pulse 89   Ht 6' 2 (1.88 m)   Wt 156 lb 12.8 oz (71.1 kg)   SpO2 96%   BMI 20.13 kg/m  Gen: NAD, resting comfortably  CV: RRR no murmurs rubs or gallops Lungs: CTAB no crackles, wheeze, rhonchi Ext: trace edema Skin: warm, dry Neuro: walking with walker     Assessment and Plan   # Emergency department follow-up for right hip fracture due to fall Hospital summary based on discharge summary:  Patient was admitted on 01/19/2024 after losing his balance and falling on his right hip with onset of pain acutely-found to have right comminuted right femoral neck fracture and underwent right hip hemiarthroplasty on 01/20/2024 by Dr. Charol Copas.  Postoperatively he did have some issues with delirium as well as pain and weakness affecting mobility and ADLs.  CIR was recommended-had been independent prior to arrival but was requiring heavy assist -Patient also was found to have  compression fracture and placed in LSO back brace.  Norco was use 1-2 times a day for breakthrough pain with goal of tapering off medicine -Also had issues with sleep and was started on Seroquel  but plan was to wean this as well -He did have significant orthostatic changes in blood pressure and was encouraged to increase fluid intake, TED hose and abdominal binder potentially.  His baseline amlodipine  was stopped.  Also had prior prescription for hydralazine  if blood pressure was substantially elevated but has not needed.  Lisinopril  was reduced from 40 mg to 5 mg -Did have some blood sugar elevations in the hospital but last A1c was well-controlled  Eventually patient made no progress that he was able to be discharged to home health physical therapy and home health occupational therapy with [facility] -Had been on Cymbalta  previously and this was continued.  He was also discharged with hydrocodone  for sparing use  Today patient reports: Ongoing hip and back pain- using about 2 pain medicines each day.  Has been dealing with diarrhea - did receive 2 doses of ancef  post surgery. 4-5 x a day still  Noting a lot of temperature variation- no fevers- temp more on low side- usually around 97. Consider TSH if persistent Blood pressure running lower at home particularly with physical therapy - 100/60 Physical therapy and occupational therapy coming once a week- I'm open to signing for more frequency to help him Waking up every 2 hours or so- sleeping in recliner feels better- slept better last 2 nights Losing weight after this -Some confusion issues - can be worse in eening - sore on right heel- in recliner may help-  -able to wean off seroquel   Assessment & Plan S/P right hip fracture Appears to be healing appropriately-continue close follow-up with orthopedics.  Did have some anemia postsurgery and offered repeat today CBC- opts in -he wants to try to increase physical therapy and  occupational therapy to more than once a week- I'm on board- will write if needed Closed compression fracture of L4 lumbar vertebra with routine healing, subsequent encounter Patient was ongoing pain related to this as well as his back pain-I am willing to refill his hydrocodone -20 tablets has lasted him 9 days but we discussed trying to begin to space this out- refill today Atrial flutter, unspecified type (HCC) Rate control without medicine-in fact has history of bradycardia.  With fall risk we discussed potentially stopping Xarelto  although that could increase stroke risk- from avs With newer weakness and already having higher fall risk plus recent fracture lets stop the xarelto - slightly increases stroke risk if you went back into atrial flutter but the risk would be with a fall of a major bleed.  Will stay on aspirin  Type 2  diabetes mellitus with other circulatory complication, without long-term current use of insulin  (HCC) Last A1c well-controlled under 7-not quite due for repeat-continue to monitor Essential hypertension Blood pressure well-controlled today despite stopping amlodipine  and lisinopril  being reduced to 5 mg from 40 mg.  With orthostatic symptoms encouraged him to remain well-hydrated. Blood pressure running low at home near 100/60 at times- stop lisinopril  at least for now -possible diarrhea is running pressure down- will need to keep close eye on this Hyperlipidemia, unspecified hyperlipidemia type Lipids have been very well-controlled on rosuvastatin  10 mg-continue current medication Lab Results  Component Value Date   CHOL 109 05/29/2023   HDL 41.60 05/29/2023   LDLCALC 49 05/29/2023   LDLDIRECT 54.0 06/05/2017   TRIG 91.0 05/29/2023   CHOLHDL 3 05/29/2023    Coronary artery disease involving native coronary artery of native heart without angina pectoris -Denies chest pain shortness of breath-continue statin and aspirin  81 mg Diarrhea of presumed infectious  origin With ongoing diarrhea- check stool studies as below  Recommended follow up: schedule 11 20 next Wednesday to check back in and look at progress Future Appointments  Date Time Provider Department Center  03/24/2024  4:15 PM Ruffin Cotton, DPM [facility]  05/30/2024 10:00 AM Arlene Ben Saverio Curling, MD [facility]    Lab/Order associations:   ICD-10-CM   1. S/P right hip fracture  Z87.81     2. Closed compression fracture of L4 lumbar vertebra with routine healing, subsequent encounter  S32.040D     3. Atrial flutter, unspecified type (HCC)  I48.92     4. Type 2 diabetes mellitus with other circulatory complication, without long-term current use of insulin  (HCC)  E11.59     5. Essential hypertension  I10 Comprehensive metabolic panel with GFR    6. Hyperlipidemia, unspecified hyperlipidemia type  E78.5 Comprehensive metabolic panel with GFR    7. Coronary artery disease involving native coronary artery of native heart without angina pectoris  I25.10     8. Diarrhea of presumed infectious origin  R19.7 C. difficile GDH and Toxin A/B    Gastrointestinal Pathogen Pnl RT, PCR    9. Anemia, unspecified type  D64.9 CBC with Differential/Platelet      Meds ordered this encounter  Medications   HYDROcodone -acetaminophen  (NORCO/VICODIN) 5-325 MG tablet    Sig: Take 1 tablet by mouth every 6 (six) hours as needed for severe pain (pain score 7-10) (pain from femur fracture).    Dispense:  20 tablet    Refill:  0    Return precautions advised.  Clarisa Crooked, MD

## 2024-02-20 NOTE — Patient Instructions (Addendum)
 Please stop by lab before you go- including stool collection kit If you have mychart- we will send your results within 3 business days of us  receiving them.  If you do not have mychart- we will call you about results within 5 business days of us  receiving them.  *please also note that you will see labs on mychart as soon as they post. I will later go in and write notes on them- will say notes from Dr. Arlene Ben   Stop lisinopril  at least of rnow  With newer weakness and already having higher fall risk plus recent fracture lets stop the xarelto - slightly increases stroke risk if you went back into atrial flutter but the risk would be with a fall of a major bleed.   Recommended follow up: schedule 11 20 next Wednesday to check back in and look at progress

## 2024-02-20 NOTE — Assessment & Plan Note (Signed)
-  Denies chest pain shortness of breath-continue statin and aspirin  81 mg

## 2024-02-20 NOTE — Assessment & Plan Note (Signed)
 Lipids have been very well-controlled on rosuvastatin  10 mg-continue current medication Lab Results  Component Value Date   CHOL 109 05/29/2023   HDL 41.60 05/29/2023   LDLCALC 49 05/29/2023   LDLDIRECT 54.0 06/05/2017   TRIG 91.0 05/29/2023   CHOLHDL 3 05/29/2023

## 2024-02-20 NOTE — Assessment & Plan Note (Signed)
 Last A1c well-controlled under 7-not quite due for repeat-continue to monitor

## 2024-02-20 NOTE — Assessment & Plan Note (Signed)
 Blood pressure well-controlled today despite stopping amlodipine  and lisinopril  being reduced to 5 mg from 40 mg.  With orthostatic symptoms encouraged him to remain well-hydrated. Blood pressure running low at home near 100/60 at times- stop lisinopril  at least for now -possible diarrhea is running pressure down- will need to keep close eye on this

## 2024-02-20 NOTE — Assessment & Plan Note (Signed)
 Rate control without medicine-in fact has history of bradycardia.  With fall risk we discussed potentially stopping Xarelto  although that could increase stroke risk- from avs With newer weakness and already having higher fall risk plus recent fracture lets stop the xarelto - slightly increases stroke risk if you went back into atrial flutter but the risk would be with a fall of a major bleed.  Will stay on aspirin 

## 2024-02-21 ENCOUNTER — Other Ambulatory Visit: Payer: Self-pay | Admitting: Family Medicine

## 2024-02-21 ENCOUNTER — Ambulatory Visit: Payer: Self-pay | Admitting: Family Medicine

## 2024-02-21 DIAGNOSIS — S32040S Wedge compression fracture of fourth lumbar vertebra, sequela: Secondary | ICD-10-CM

## 2024-02-21 LAB — COMPREHENSIVE METABOLIC PANEL WITH GFR
AG Ratio: 1.8 (calc) (ref 1.0–2.5)
ALT: 8 U/L — ABNORMAL LOW (ref 9–46)
AST: 10 U/L (ref 10–35)
Albumin: 4.3 g/dL (ref 3.6–5.1)
Alkaline phosphatase (APISO): 153 U/L — ABNORMAL HIGH (ref 35–144)
BUN: 18 mg/dL (ref 7–25)
CO2: 22 mmol/L (ref 20–32)
Calcium: 10 mg/dL (ref 8.6–10.3)
Chloride: 105 mmol/L (ref 98–110)
Creat: 0.91 mg/dL (ref 0.70–1.22)
Globulin: 2.4 g/dL (ref 1.9–3.7)
Glucose, Bld: 144 mg/dL — ABNORMAL HIGH (ref 65–99)
Potassium: 3.6 mmol/L (ref 3.5–5.3)
Sodium: 140 mmol/L (ref 135–146)
Total Bilirubin: 0.6 mg/dL (ref 0.2–1.2)
Total Protein: 6.7 g/dL (ref 6.1–8.1)
eGFR: 79 mL/min/{1.73_m2} (ref >=60)

## 2024-02-21 LAB — CBC WITH DIFFERENTIAL/PLATELET
Absolute Lymphocytes: 1008 {cells}/uL (ref 850–3900)
Absolute Monocytes: 391 {cells}/uL (ref 200–950)
Basophils Absolute: 82 {cells}/uL (ref 0–200)
Basophils Relative: 1.3 %
Eosinophils Absolute: 82 {cells}/uL (ref 15–500)
Eosinophils Relative: 1.3 %
HCT: 41.3 % (ref 38.5–50.0)
Hemoglobin: 12.6 g/dL — ABNORMAL LOW (ref 13.2–17.1)
MCH: 30.5 pg (ref 27.0–33.0)
MCHC: 30.5 g/dL — ABNORMAL LOW (ref 32.0–36.0)
MCV: 100 fL (ref 80.0–100.0)
MPV: 9.8 fL (ref 7.5–12.5)
Monocytes Relative: 6.2 %
Neutro Abs: 4738 {cells}/uL (ref 1500–7800)
Neutrophils Relative %: 75.2 %
Platelets: 223 10*3/uL (ref 140–400)
RBC: 4.13 10*6/uL — ABNORMAL LOW (ref 4.20–5.80)
RDW: 15.7 % — ABNORMAL HIGH (ref 11.0–15.0)
Total Lymphocyte: 16 %
WBC: 6.3 10*3/uL (ref 3.8–10.8)

## 2024-02-22 ENCOUNTER — Other Ambulatory Visit

## 2024-02-22 ENCOUNTER — Ambulatory Visit: Payer: Medicare Other | Admitting: Cardiovascular Disease

## 2024-02-22 DIAGNOSIS — R197 Diarrhea, unspecified: Secondary | ICD-10-CM | POA: Diagnosis not present

## 2024-02-25 ENCOUNTER — Ambulatory Visit: Payer: Medicare Other | Admitting: Cardiovascular Disease

## 2024-02-25 ENCOUNTER — Encounter: Payer: Self-pay | Admitting: Family Medicine

## 2024-02-25 LAB — C. DIFFICILE GDH AND TOXIN A/B
GDH ANTIGEN: NOT DETECTED
MICRO NUMBER:: 16578138
SPECIMEN QUALITY:: ADEQUATE
TOXIN A AND B: NOT DETECTED

## 2024-02-26 LAB — GASTROINTESTINAL PATHOGEN PNL

## 2024-02-27 ENCOUNTER — Other Ambulatory Visit: Payer: Self-pay

## 2024-02-27 ENCOUNTER — Ambulatory Visit (INDEPENDENT_AMBULATORY_CARE_PROVIDER_SITE_OTHER): Admitting: Family Medicine

## 2024-02-27 ENCOUNTER — Encounter: Payer: Self-pay | Admitting: Family Medicine

## 2024-02-27 VITALS — BP 130/62 | HR 73 | Temp 97.4°F | Ht 74.0 in | Wt 154.8 lb

## 2024-02-27 DIAGNOSIS — I4891 Unspecified atrial fibrillation: Secondary | ICD-10-CM | POA: Diagnosis not present

## 2024-02-27 DIAGNOSIS — E1159 Type 2 diabetes mellitus with other circulatory complications: Secondary | ICD-10-CM

## 2024-02-27 DIAGNOSIS — I2510 Atherosclerotic heart disease of native coronary artery without angina pectoris: Secondary | ICD-10-CM | POA: Diagnosis not present

## 2024-02-27 DIAGNOSIS — S32040D Wedge compression fracture of fourth lumbar vertebra, subsequent encounter for fracture with routine healing: Secondary | ICD-10-CM | POA: Diagnosis not present

## 2024-02-27 DIAGNOSIS — I4892 Unspecified atrial flutter: Secondary | ICD-10-CM | POA: Diagnosis not present

## 2024-02-27 DIAGNOSIS — R197 Diarrhea, unspecified: Secondary | ICD-10-CM | POA: Diagnosis not present

## 2024-02-27 DIAGNOSIS — S32049S Unspecified fracture of fourth lumbar vertebra, sequela: Secondary | ICD-10-CM

## 2024-02-27 DIAGNOSIS — S72001D Fracture of unspecified part of neck of right femur, subsequent encounter for closed fracture with routine healing: Secondary | ICD-10-CM | POA: Diagnosis not present

## 2024-02-27 DIAGNOSIS — I10 Essential (primary) hypertension: Secondary | ICD-10-CM | POA: Diagnosis not present

## 2024-02-27 DIAGNOSIS — I495 Sick sinus syndrome: Secondary | ICD-10-CM | POA: Diagnosis not present

## 2024-02-27 MED ORDER — HYDROCODONE-ACETAMINOPHEN 5-325 MG PO TABS: 1.0000 | ORAL_TABLET | ORAL | 0 refills | Status: DC | PRN

## 2024-02-27 NOTE — Patient Instructions (Addendum)
 Please stop by lab before you go to pick up stool collection materials If you have mychart- we will send your results within 3 business days of us  receiving them.  If you do not have mychart- we will call you about results within 5 business days of us  receiving them.  *please also note that you will see labs on mychart as soon as they post. I will later go in and write notes on them- will say notes from Dr. Arlene Ben   Refill hydrocodone  but try to space as much as possible still every 6-8 hours but tried to give you enough to hold you over until I get back from vacation  Recommended follow up: Return in about 1 month (around 03/28/2024) for followup or sooner if needed.Schedule b4 you leave. If not feeling better lets try for 2-3 week follow up instead

## 2024-02-27 NOTE — Progress Notes (Signed)
 Phone [phone] In person visit   Subjective:   Sean Lane is a 89 y.o. year old very pleasant male patient who presents for/with See problem oriented charting Chief Complaint  Patient presents with   1 week f/u   Past Medical History-  Patient Active Problem List   Diagnosis Date Noted   Memory change 11/09/2022    Priority: High   CAD S/P percutaneous coronary angioplasty 10/26/2016    Priority: High   Type 2 diabetes mellitus with CAD(HCC) 12/14/2012    Priority: High   Atrial flutter (HCC) 10/03/2011    Priority: High   CAD (coronary artery disease) 12/17/2007    Priority: High   SINUS BRADYCARDIA 07/20/2010    Priority: Medium    BPH associated with nocturia 06/08/2009    Priority: Medium    DIZZINESS 12/15/2008    Priority: Medium    Hyperlipidemia 04/02/2007    Priority: Medium    Essential hypertension 04/02/2007    Priority: Medium    Diverticulitis of colon 11/22/2015    Priority: Low   Nephrolithiasis 05/07/2014    Priority: Low   Gout attack 12/14/2012    Priority: Low   Hematuria 03/28/2012    Priority: Low   RBBB 12/28/2008    Priority: Low   GERD 12/15/2008    Priority: Low   LOSS, CONDUCTIVE HEARING, COMBINED TYPE 04/16/2007    Priority: Low   Allergic rhinitis 04/02/2007    Priority: Low   Bilateral wrist pain 05/18/2021    Priority: 1.   Arthritis of carpometacarpal [facility]) joint of both thumbs 05/03/2021    Priority: 1.   Left rotator cuff tear 12/09/2020    Priority: 1.   Degenerative disc disease, lumbar 07/21/2020    Priority: 1.   Nonallopathic lesion of sacral region 12/17/2019    Priority: 1.   Nonallopathic lesion of lumbosacral region 12/17/2019    Priority: 1.   Spasm of left piriformis muscle 06/27/2019    Priority: 1.   Degenerative disc disease, cervical 05/28/2019    Priority: 1.   Nonallopathic lesion of thoracic region 05/28/2019    Priority: 1.   Orthostatic hypertension 02/05/2024   Delirium due to  general medical condition 02/05/2024   Insomnia 02/05/2024   Fracture, proximal femur, right, sequela 01/24/2024   Femur fracture (HCC) 01/19/2024   Fracture of femoral neck, right, closed (HCC) 01/19/2024   Sensorineural hearing loss (SNHL) of both ears 12/01/2022    Medications- reviewed and updated Current Outpatient Medications  Medication Sig Dispense Refill   aspirin  EC 81 MG tablet Take 81 mg by mouth daily.     Cholecalciferol  (VITAMIN D -3 PO) Take 1 capsule by mouth daily with supper.     Cyanocobalamin  (VITAMIN B-12 PO) Take 1 tablet by mouth daily.     DULoxetine  (CYMBALTA ) 20 MG capsule TAKE 1 CAPSULE DAILY 90 capsule 1   HYDROcodone -acetaminophen  (NORCO/VICODIN) 5-325 MG tablet Take 1 tablet by mouth every 6 (six) hours as needed for severe pain (pain score 7-10) (pain from femur fracture). 20 tablet 0   rosuvastatin  (CRESTOR ) 10 MG tablet TAKE 1 TABLET DAILY 90 tablet 3   No current facility-administered medications for this visit.     Objective:  BP 130/62   Pulse 73   Temp (!) 97.4 F (36.3 C)   Ht 6' 2 (1.88 m)   Wt 154 lb 12.8 oz (70.2 kg)   SpO2 97%   BMI 19.88 kg/m  Gen: NAD, appears thin and somewhat uncomfortable  in seated position CV: RRR no murmurs rubs or gallops Lungs: CTAB no crackles, wheeze, rhonchi Ext: trace edema Skin: warm, dry     Assessment and Plan   # Right hip fracture due to fall # Lumbar compression fracture # Diarrhea S: See most recent note on emergency department/hospital follow-up-patient underwent right hip hemiarthroplasty on 01/20/2024 by Dr. Charol Copas - Did have some postoperative issues with delirium as well as pain and weakness affecting mobility and ADLs and CIR was recommended but did not qualify - Also had compression fracture and placed in LSO back brace and required Norco 1-2 times a day for breakthrough pain - Also in hospital had significant orthostatic changes and was encouraged to increase fluid intake, TED hose,  abdominal binder potentially and baseline amlodipine  was stopped with lisinopril  being reduced from 40 to 5 mg  At last visit he reported ongoing hip and back pain still needing about 2 hydrocodone  per day-back primarily being the main issue.  -He was also having some diarrhea 4-5 episodes a day -Was working with physical therapy once a week but I was open to increasing this -Was sleeping in recliner -She was losing weight with low appetite -Some confusion worse in the evening -Sore on the right heel and we encouraged him to prop this up -For significant back pain we refilled his hydrocodone  with hopes that this would last 9 days or longer -Blood pressure was running lower so we had him hold his lisinopril  last visitas was around 100/60 at home and in office  -His labs showed improvement in anemia -still mainly 2 hydrocodone  per day -drinking plenty of fluids despite the diarrhea persistence at 4-5 per day with weight down 2 lbs  - still with sundowning symptoms in the evenings or if wakes up at night - ongoing fatigue   A/P: Patient seems to be making some improvement with right hip pain as he gets further out from his fracture  With that being said his most troublesome symptom is the ongoing low back pain from the lumbar compression fracture and he has had essentially no improvement-he has reached out to Dr. Felipe Horton about options and kyphoplasty was recommended with interventional radiology-I encouraged patient to move forward with this and he plans to reach out-I think the back pain is one of the greatest barriers to his improvement and it is causing him to need ongoing pain medicine I think combination of the pain as well as the pain medicine is contributing to fatigue and lack of improvement -Patient with baseline cognitive concerns but is pushing out memory testing for now until we get him to a more stable place  I also think the diarrhea is contributing and we will repeat the stool  testing with GI pathogen panel and give fresh instructions through the lab for collection.  May consider probiotic but want to rule out infection first-continue to push fluids - off ensure for a few days to see if that helps but could contribute to his weight loss  #hypertension S: medication: All blood pressure medicine including lisinopril  5 mg currently on hold Home readings #s: 120s or 130s BP Readings from Last 3 Encounters:  02/27/24 130/62  02/20/24 120/70  02/05/24 (!) 151/61  A/P: Blood pressure well-controlled at medicine at this point-once the diarrhea has resolved suspect we may be able to restart the lisinopril  and that he may require this but hold off for now that she has also had some rather substantial weight loss and not 100% sure  he will be able to tolerate this  # Diabetes S: Medication: None Lab Results  Component Value Date   HGBA1C 6.3 11/27/2023   HGBA1C 6.3 (H) 03/16/2023   HGBA1C 6.8 (H) 09/21/2022    A/P: Remains well-controlled without medicine-too soon for repeat-consider at next visit-would not be surprised if slightly increased due to stress of recent injuries and illness on 1 hand but on the other hand has lost weight and appetite is poor   # With prior falls he remains off of Xarelto  due to fall risk   Recommended follow up: No follow-ups on file. Future Appointments  Date Time Provider Department Center  03/24/2024  4:15 PM Ruffin Cotton, DPM [facility]  05/30/2024 10:00 AM Arlene Ben Saverio Curling, MD [facility]    Lab/Order associations: No diagnosis found.  No orders of the defined types were placed in this encounter.   Return precautions advised.  Clarisa Crooked, MD

## 2024-02-28 ENCOUNTER — Telehealth: Payer: Self-pay | Admitting: Family Medicine

## 2024-02-28 DIAGNOSIS — I2510 Atherosclerotic heart disease of native coronary artery without angina pectoris: Secondary | ICD-10-CM | POA: Diagnosis not present

## 2024-02-28 DIAGNOSIS — I4891 Unspecified atrial fibrillation: Secondary | ICD-10-CM | POA: Diagnosis not present

## 2024-02-28 DIAGNOSIS — S72001D Fracture of unspecified part of neck of right femur, subsequent encounter for closed fracture with routine healing: Secondary | ICD-10-CM | POA: Diagnosis not present

## 2024-02-28 DIAGNOSIS — E1159 Type 2 diabetes mellitus with other circulatory complications: Secondary | ICD-10-CM | POA: Diagnosis not present

## 2024-02-28 DIAGNOSIS — I495 Sick sinus syndrome: Secondary | ICD-10-CM | POA: Diagnosis not present

## 2024-02-28 DIAGNOSIS — I4892 Unspecified atrial flutter: Secondary | ICD-10-CM | POA: Diagnosis not present

## 2024-02-28 NOTE — Telephone Encounter (Signed)
 Received faxed  dropped off document Home Health Certificate (Order ID [id number]), to be filled out by provider. Patient requested to send it back via Fax . Document is located in providers tray at front office.Please advise

## 2024-03-04 ENCOUNTER — Other Ambulatory Visit

## 2024-03-04 DIAGNOSIS — I4891 Unspecified atrial fibrillation: Secondary | ICD-10-CM | POA: Diagnosis not present

## 2024-03-04 DIAGNOSIS — R197 Diarrhea, unspecified: Secondary | ICD-10-CM

## 2024-03-04 DIAGNOSIS — I4892 Unspecified atrial flutter: Secondary | ICD-10-CM | POA: Diagnosis not present

## 2024-03-04 DIAGNOSIS — E1159 Type 2 diabetes mellitus with other circulatory complications: Secondary | ICD-10-CM | POA: Diagnosis not present

## 2024-03-04 DIAGNOSIS — S72001D Fracture of unspecified part of neck of right femur, subsequent encounter for closed fracture with routine healing: Secondary | ICD-10-CM | POA: Diagnosis not present

## 2024-03-04 DIAGNOSIS — I2510 Atherosclerotic heart disease of native coronary artery without angina pectoris: Secondary | ICD-10-CM | POA: Diagnosis not present

## 2024-03-04 DIAGNOSIS — I495 Sick sinus syndrome: Secondary | ICD-10-CM | POA: Diagnosis not present

## 2024-03-06 DIAGNOSIS — E1159 Type 2 diabetes mellitus with other circulatory complications: Secondary | ICD-10-CM | POA: Diagnosis not present

## 2024-03-06 DIAGNOSIS — I2510 Atherosclerotic heart disease of native coronary artery without angina pectoris: Secondary | ICD-10-CM | POA: Diagnosis not present

## 2024-03-06 DIAGNOSIS — I4892 Unspecified atrial flutter: Secondary | ICD-10-CM | POA: Diagnosis not present

## 2024-03-06 DIAGNOSIS — S72001D Fracture of unspecified part of neck of right femur, subsequent encounter for closed fracture with routine healing: Secondary | ICD-10-CM | POA: Diagnosis not present

## 2024-03-06 DIAGNOSIS — I4891 Unspecified atrial fibrillation: Secondary | ICD-10-CM | POA: Diagnosis not present

## 2024-03-06 DIAGNOSIS — I495 Sick sinus syndrome: Secondary | ICD-10-CM | POA: Diagnosis not present

## 2024-03-06 NOTE — Telephone Encounter (Signed)
 Paper work completed and faxed back.

## 2024-03-07 DIAGNOSIS — E1159 Type 2 diabetes mellitus with other circulatory complications: Secondary | ICD-10-CM | POA: Diagnosis not present

## 2024-03-07 DIAGNOSIS — M62838 Other muscle spasm: Secondary | ICD-10-CM | POA: Diagnosis not present

## 2024-03-07 DIAGNOSIS — N401 Enlarged prostate with lower urinary tract symptoms: Secondary | ICD-10-CM | POA: Diagnosis not present

## 2024-03-07 DIAGNOSIS — G4700 Insomnia, unspecified: Secondary | ICD-10-CM | POA: Diagnosis not present

## 2024-03-07 DIAGNOSIS — H903 Sensorineural hearing loss, bilateral: Secondary | ICD-10-CM | POA: Diagnosis not present

## 2024-03-07 DIAGNOSIS — I10 Essential (primary) hypertension: Secondary | ICD-10-CM | POA: Diagnosis not present

## 2024-03-07 DIAGNOSIS — K5900 Constipation, unspecified: Secondary | ICD-10-CM | POA: Diagnosis not present

## 2024-03-07 DIAGNOSIS — M5030 Other cervical disc degeneration, unspecified cervical region: Secondary | ICD-10-CM | POA: Diagnosis not present

## 2024-03-07 DIAGNOSIS — I4891 Unspecified atrial fibrillation: Secondary | ICD-10-CM | POA: Diagnosis not present

## 2024-03-07 DIAGNOSIS — S72001D Fracture of unspecified part of neck of right femur, subsequent encounter for closed fracture with routine healing: Secondary | ICD-10-CM | POA: Diagnosis not present

## 2024-03-07 DIAGNOSIS — S32048D Other fracture of fourth lumbar vertebra, subsequent encounter for fracture with routine healing: Secondary | ICD-10-CM | POA: Diagnosis not present

## 2024-03-07 DIAGNOSIS — N390 Urinary tract infection, site not specified: Secondary | ICD-10-CM | POA: Diagnosis not present

## 2024-03-07 DIAGNOSIS — F05 Delirium due to known physiological condition: Secondary | ICD-10-CM | POA: Diagnosis not present

## 2024-03-07 DIAGNOSIS — M109 Gout, unspecified: Secondary | ICD-10-CM | POA: Diagnosis not present

## 2024-03-07 DIAGNOSIS — M51369 Other intervertebral disc degeneration, lumbar region without mention of lumbar back pain or lower extremity pain: Secondary | ICD-10-CM | POA: Diagnosis not present

## 2024-03-07 DIAGNOSIS — B964 Proteus (mirabilis) (morganii) as the cause of diseases classified elsewhere: Secondary | ICD-10-CM | POA: Diagnosis not present

## 2024-03-07 DIAGNOSIS — I2510 Atherosclerotic heart disease of native coronary artery without angina pectoris: Secondary | ICD-10-CM | POA: Diagnosis not present

## 2024-03-07 DIAGNOSIS — R630 Anorexia: Secondary | ICD-10-CM | POA: Diagnosis not present

## 2024-03-07 DIAGNOSIS — R3915 Urgency of urination: Secondary | ICD-10-CM | POA: Diagnosis not present

## 2024-03-07 DIAGNOSIS — B961 Klebsiella pneumoniae [K. pneumoniae] as the cause of diseases classified elsewhere: Secondary | ICD-10-CM | POA: Diagnosis not present

## 2024-03-07 DIAGNOSIS — E876 Hypokalemia: Secondary | ICD-10-CM | POA: Diagnosis not present

## 2024-03-07 DIAGNOSIS — M79671 Pain in right foot: Secondary | ICD-10-CM | POA: Diagnosis not present

## 2024-03-07 DIAGNOSIS — I4892 Unspecified atrial flutter: Secondary | ICD-10-CM | POA: Diagnosis not present

## 2024-03-07 DIAGNOSIS — I495 Sick sinus syndrome: Secondary | ICD-10-CM | POA: Diagnosis not present

## 2024-03-07 DIAGNOSIS — R350 Frequency of micturition: Secondary | ICD-10-CM | POA: Diagnosis not present

## 2024-03-07 LAB — GASTROINTESTINAL PATHOGEN PNL

## 2024-03-10 DIAGNOSIS — I4891 Unspecified atrial fibrillation: Secondary | ICD-10-CM | POA: Diagnosis not present

## 2024-03-10 DIAGNOSIS — E1159 Type 2 diabetes mellitus with other circulatory complications: Secondary | ICD-10-CM | POA: Diagnosis not present

## 2024-03-10 DIAGNOSIS — S72001D Fracture of unspecified part of neck of right femur, subsequent encounter for closed fracture with routine healing: Secondary | ICD-10-CM | POA: Diagnosis not present

## 2024-03-10 DIAGNOSIS — I4892 Unspecified atrial flutter: Secondary | ICD-10-CM | POA: Diagnosis not present

## 2024-03-10 DIAGNOSIS — I2510 Atherosclerotic heart disease of native coronary artery without angina pectoris: Secondary | ICD-10-CM | POA: Diagnosis not present

## 2024-03-10 DIAGNOSIS — I495 Sick sinus syndrome: Secondary | ICD-10-CM | POA: Diagnosis not present

## 2024-03-11 ENCOUNTER — Ambulatory Visit: Payer: Self-pay | Admitting: Family Medicine

## 2024-03-11 ENCOUNTER — Encounter: Payer: Self-pay | Admitting: Family Medicine

## 2024-03-11 DIAGNOSIS — R197 Diarrhea, unspecified: Secondary | ICD-10-CM

## 2024-03-11 DIAGNOSIS — Z96641 Presence of right artificial hip joint: Secondary | ICD-10-CM | POA: Diagnosis not present

## 2024-03-11 DIAGNOSIS — Z471 Aftercare following joint replacement surgery: Secondary | ICD-10-CM | POA: Diagnosis not present

## 2024-03-18 ENCOUNTER — Other Ambulatory Visit

## 2024-03-18 DIAGNOSIS — R197 Diarrhea, unspecified: Secondary | ICD-10-CM

## 2024-03-24 ENCOUNTER — Encounter: Payer: Self-pay | Admitting: Podiatry

## 2024-03-24 ENCOUNTER — Ambulatory Visit (INDEPENDENT_AMBULATORY_CARE_PROVIDER_SITE_OTHER): Admitting: Podiatry

## 2024-03-24 ENCOUNTER — Ambulatory Visit: Payer: Self-pay | Admitting: Family Medicine

## 2024-03-24 DIAGNOSIS — M79675 Pain in left toe(s): Secondary | ICD-10-CM

## 2024-03-24 DIAGNOSIS — M79674 Pain in right toe(s): Secondary | ICD-10-CM

## 2024-03-24 DIAGNOSIS — B351 Tinea unguium: Secondary | ICD-10-CM | POA: Diagnosis not present

## 2024-03-24 DIAGNOSIS — E1151 Type 2 diabetes mellitus with diabetic peripheral angiopathy without gangrene: Secondary | ICD-10-CM

## 2024-03-24 LAB — GASTROINTESTINAL PATHOGEN PNL

## 2024-03-24 NOTE — Progress Notes (Signed)
 This patient returns to my office for at risk foot care.  This patient requires this care by a professional since this patient will be at risk due to having type 2 diabetes and coagulation defect.  Patient is taking xarelto .  This patient presents to the office with his wife.  This patient is unable to cut nails himself since the patient cannot reach his nails.These nails are painful walking and wearing shoes.  This patient presents for at risk foot care today.  General Appearance  Alert, conversant and in no acute stress.  Vascular  Dorsalis pedis   pulses are  weakly palpable  bilaterally. Posterior tibial pulses are absent  Bilaterally. Capillary return is within normal limits  bilaterally. Temperature is within normal limits  bilaterally.  Neurologic  Senn-Weinstein monofilament wire test diminished   bilaterally. Muscle power within normal limits bilaterally.  Nails Thick disfigured discolored nails with subungual debris  from hallux to fifth toes bilaterally. Hammer toes 2-4  B/L.  Orthopedic  No limitations of motion  feet .  No crepitus or effusions noted.  No bony pathology or digital deformities noted.  Skin  normotropic skin with no porokeratosis noted bilaterally.  No signs of infections or ulcers noted.     Onychomycosis  Pain in right toes  Pain in left toes  Consent was obtained for treatment procedures.   Mechanical debridement of nails 1-5  bilaterally performed with a nail nipper.  Filed with dremel without incident.    Return office visit   12  weeks                 Told patient to return for periodic foot care and evaluation due to potential at risk complications.   Cordella Bold DPM

## 2024-03-31 ENCOUNTER — Ambulatory Visit (INDEPENDENT_AMBULATORY_CARE_PROVIDER_SITE_OTHER): Admitting: Family Medicine

## 2024-03-31 ENCOUNTER — Encounter: Payer: Self-pay | Admitting: Family Medicine

## 2024-03-31 VITALS — BP 122/62 | HR 73 | Temp 97.9°F | Ht 74.0 in | Wt 160.4 lb

## 2024-03-31 DIAGNOSIS — E785 Hyperlipidemia, unspecified: Secondary | ICD-10-CM | POA: Diagnosis not present

## 2024-03-31 DIAGNOSIS — I2510 Atherosclerotic heart disease of native coronary artery without angina pectoris: Secondary | ICD-10-CM

## 2024-03-31 DIAGNOSIS — I10 Essential (primary) hypertension: Secondary | ICD-10-CM

## 2024-03-31 DIAGNOSIS — Z9861 Coronary angioplasty status: Secondary | ICD-10-CM

## 2024-03-31 NOTE — Progress Notes (Signed)
 Phone [phone] In person visit   Subjective:   Sean Lane is a 89 y.o. year old very pleasant male patient who presents for/with See problem oriented charting Chief Complaint  Patient presents with   1 month f/u   Past Medical History-  Patient Active Problem List   Diagnosis Date Noted   Memory change 11/09/2022    Priority: High   CAD S/P percutaneous coronary angioplasty 10/26/2016    Priority: High   Type 2 diabetes mellitus with CAD(HCC) 12/14/2012    Priority: High   Atrial flutter (HCC) 10/03/2011    Priority: High   CAD (coronary artery disease) 12/17/2007    Priority: High   SINUS BRADYCARDIA 07/20/2010    Priority: Medium    BPH associated with nocturia 06/08/2009    Priority: Medium    DIZZINESS 12/15/2008    Priority: Medium    Hyperlipidemia 04/02/2007    Priority: Medium    Essential hypertension 04/02/2007    Priority: Medium    Diverticulitis of colon 11/22/2015    Priority: Low   Nephrolithiasis 05/07/2014    Priority: Low   Gout attack 12/14/2012    Priority: Low   Hematuria 03/28/2012    Priority: Low   RBBB 12/28/2008    Priority: Low   GERD 12/15/2008    Priority: Low   LOSS, CONDUCTIVE HEARING, COMBINED TYPE 04/16/2007    Priority: Low   Allergic rhinitis 04/02/2007    Priority: Low   Bilateral wrist pain 05/18/2021    Priority: 1.   Arthritis of carpometacarpal [facility]) joint of both thumbs 05/03/2021    Priority: 1.   Left rotator cuff tear 12/09/2020    Priority: 1.   Degenerative disc disease, lumbar 07/21/2020    Priority: 1.   Nonallopathic lesion of sacral region 12/17/2019    Priority: 1.   Nonallopathic lesion of lumbosacral region 12/17/2019    Priority: 1.   Spasm of left piriformis muscle 06/27/2019    Priority: 1.   Degenerative disc disease, cervical 05/28/2019    Priority: 1.   Nonallopathic lesion of thoracic region 05/28/2019    Priority: 1.   Orthostatic hypertension 02/05/2024   Delirium due to  general medical condition 02/05/2024   Insomnia 02/05/2024   Fracture, proximal femur, right, sequela 01/24/2024   Femur fracture (HCC) 01/19/2024   Fracture of femoral neck, right, closed (HCC) 01/19/2024   Sensorineural hearing loss (SNHL) of both ears 12/01/2022    Medications- reviewed and updated Current Outpatient Medications  Medication Sig Dispense Refill   aspirin  EC 81 MG tablet Take 81 mg by mouth daily.     Cholecalciferol  (VITAMIN D -3 PO) Take 1 capsule by mouth daily with supper.     Cyanocobalamin  (VITAMIN B-12 PO) Take 1 tablet by mouth daily.     DULoxetine  (CYMBALTA ) 20 MG capsule TAKE 1 CAPSULE DAILY 90 capsule 1   rosuvastatin  (CRESTOR ) 10 MG tablet TAKE 1 TABLET DAILY 90 tablet 3   No current facility-administered medications for this visit.     Objective:  BP 122/62   Pulse 73   Temp 97.9 F (36.6 C)   Ht 6' 2 (1.88 m)   Wt 160 lb 6.4 oz (72.8 kg)   SpO2 95%   BMI 20.59 kg/m  Gen: NAD, resting comfortably CV: RRR no murmurs rubs or gallops Lungs: CTAB no crackles, wheeze, rhonchi  Ext: minimal edema Skin: warm, dry    Assessment and Plan    # Right hip fracture due to  fall # Lumbar compression fracture # Diarrhea-see note from February 20, 2024 from emergency department/hospital follow-up as well as note from 02/27/2024 S:  - Essentially patient had right hip hemiarthroplasty on 01/20/2024 by Dr. Fidel - Last visit patient was having significant issues with pain and weakness and even delirium - He also had a compression fracture in and placed in LSO back brace and required Norco for pain related to this  -he also had significant orthostatic issues and we encouraged pushing fluids, TED hose, abdominal binder as well as reduced lisinopril  to 5 mg from 40 mg and stopped amlodipine   -he also had diarrhea 4-5 episodes a day but was drinking plenty of fluids to counteract this.  We had multiple issues with GI pathogen panel getting processed on 2 separate  attempts -did have one slip at top of stairs but thankfully didn't go down the stairs-   Today: -Weight has rebounded back up 6 pounds! -diarrhea finally improved 1-2 weeks ago if not longer - some ongoing hip pain, back has improved but not bad enough to have IR procedure. Tylenol  controlling pain for most part - more mental clarity- less confusion  A/P: Patient continues to heal from hip fracture-we discussed this can certainly be a process.  Physical therapy was not super helpful at the house-only 15 minutes once a week so he has stopped this -He does still feel some weakness and we discussed possible referral to outpatient physical therapy but he declines for now-he wants to see how he does until next visit and then we can reconsider  Pain in his back is much improved and only using Tylenol  at this point.  Did not need interventional radiology procedure/kyphoplasty.  He does also still take Cymbalta  through Dr. Claudene of sports medicine  Diarrhea have resolved several weeks ago-thankful for this improvement and he is finally been able to regain some of his weight  Has been able to remain off blood pressure medicines as below    #hypertension S: medication: Off all medications at this point and home blood pressures have not gone back up BP Readings from Last 3 Encounters:  03/31/24 122/62  02/27/24 130/62  02/20/24 120/70  A/P: Blood pressure well-controlled-continue current medication   # CAD #hyperlipidemia S: Medication: Rosuvastatin  10 mg, aspirin  81 mg -No chest pain reported but does still experience some fatigue that seems to be gradually improving Lab Results  Component Value Date   CHOL 109 05/29/2023   HDL 41.60 05/29/2023   LDLCALC 49 05/29/2023   LDLDIRECT 54.0 06/05/2017   TRIG 91.0 05/29/2023   CHOLHDL 3 05/29/2023   A/P: CAD largely asymptomatic-continue current medication Lipids at goal-continue current medication  # Diabetes-is not due for other labs hold  off on A1c today since has been so well-controlled-has been diet controlled  Recommended follow up: Return for next already scheduled visit or sooner if needed. Future Appointments  Date Time Provider Department Center  04/18/2024  2:30 PM Claudene Arthea HERO, DO [facility] None  05/30/2024 10:00 AM Katrinka Garnette KIDD, MD [facility]  06/11/2024  4:00 PM Delford Maude BROCKS, MD [facility]  06/24/2024  3:00 PM Loreda Hacker, DPM [facility]    Lab/Order associations:   ICD-10-CM   1. Coronary artery disease involving native coronary artery of native heart without angina pectoris  I25.10     2. CAD S/P percutaneous coronary angioplasty  I25.10    Z98.61     3. Essential hypertension  I10  4. Hyperlipidemia, unspecified hyperlipidemia type  E78.5       No orders of the defined types were placed in this encounter.   Return precautions advised.  Garnette Lukes, MD

## 2024-03-31 NOTE — Patient Instructions (Addendum)
 Labs next visit  THRILLED you have made such strides  We have to avoid falls- one of the biggest things for long term health  Get better at being consistent with those home exercises!   Recommended follow up: Return for next already scheduled visit or sooner if needed.
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
## 2024-04-18 ENCOUNTER — Ambulatory Visit

## 2024-04-18 ENCOUNTER — Ambulatory Visit: Admitting: Family Medicine

## 2024-04-18 VITALS — BP 124/82 | HR 70 | Ht 74.0 in | Wt 162.0 lb

## 2024-04-18 DIAGNOSIS — M461 Sacroiliitis, not elsewhere classified: Secondary | ICD-10-CM

## 2024-04-18 DIAGNOSIS — M545 Low back pain, unspecified: Secondary | ICD-10-CM

## 2024-04-18 DIAGNOSIS — M5136 Other intervertebral disc degeneration, lumbar region with discogenic back pain only: Secondary | ICD-10-CM | POA: Diagnosis not present

## 2024-04-18 DIAGNOSIS — M51362 Other intervertebral disc degeneration, lumbar region with discogenic back pain and lower extremity pain: Secondary | ICD-10-CM | POA: Diagnosis not present

## 2024-04-18 DIAGNOSIS — M4316 Spondylolisthesis, lumbar region: Secondary | ICD-10-CM | POA: Diagnosis not present

## 2024-04-18 NOTE — Patient Instructions (Addendum)
 Injection in R SI joint Put on some weight See me in 2-3 months

## 2024-04-19 DIAGNOSIS — M461 Sacroiliitis, not elsewhere classified: Secondary | ICD-10-CM | POA: Insufficient documentation

## 2024-04-19 NOTE — Assessment & Plan Note (Signed)
 Known degenerative disc disease.  Did have the compression fracture in L4 but with most recent x-rays seems to have stabilized.  Discussed with patient as well as significant other about the possibility of kyphoplasty but does not seem to be painful in the axial spine at the moment.  Will hold on any orders such as the kyphoplasty at the moment and continue with rehabilitation

## 2024-04-19 NOTE — Assessment & Plan Note (Signed)
 Secondary to osteoarthritic changes and now compensating with patient still rehabilitating from the hip fracture and replacement.  I think we loaded the area.  Injection given today, hold on any type of manipulation until patient is further recovered.  Patient will follow-up with me again in 2 months.  Continuing to work on some rehabilitation.

## 2024-04-26 ENCOUNTER — Other Ambulatory Visit: Payer: Self-pay | Admitting: Family Medicine

## 2024-05-01 ENCOUNTER — Ambulatory Visit: Payer: Self-pay | Admitting: Family Medicine

## 2024-05-27 DIAGNOSIS — R338 Other retention of urine: Secondary | ICD-10-CM | POA: Diagnosis not present

## 2024-05-27 DIAGNOSIS — N401 Enlarged prostate with lower urinary tract symptoms: Secondary | ICD-10-CM | POA: Diagnosis not present

## 2024-05-30 ENCOUNTER — Ambulatory Visit: Payer: Self-pay | Admitting: Family Medicine

## 2024-05-30 ENCOUNTER — Encounter: Payer: Self-pay | Admitting: Family Medicine

## 2024-05-30 ENCOUNTER — Ambulatory Visit: Admitting: Family Medicine

## 2024-05-30 VITALS — BP 122/62 | HR 79 | Temp 97.9°F | Ht 74.0 in | Wt 161.6 lb

## 2024-05-30 DIAGNOSIS — E1159 Type 2 diabetes mellitus with other circulatory complications: Secondary | ICD-10-CM

## 2024-05-30 DIAGNOSIS — I10 Essential (primary) hypertension: Secondary | ICD-10-CM | POA: Diagnosis not present

## 2024-05-30 DIAGNOSIS — E785 Hyperlipidemia, unspecified: Secondary | ICD-10-CM

## 2024-05-30 DIAGNOSIS — I4892 Unspecified atrial flutter: Secondary | ICD-10-CM

## 2024-05-30 DIAGNOSIS — I2510 Atherosclerotic heart disease of native coronary artery without angina pectoris: Secondary | ICD-10-CM | POA: Diagnosis not present

## 2024-05-30 LAB — CBC WITH DIFFERENTIAL/PLATELET
Basophils Absolute: 0.1 K/uL (ref 0.0–0.1)
Basophils Relative: 1.1 % (ref 0.0–3.0)
Eosinophils Absolute: 0.1 K/uL (ref 0.0–0.7)
Eosinophils Relative: 1.9 % (ref 0.0–5.0)
HCT: 45.5 % (ref 39.0–52.0)
Hemoglobin: 14.8 g/dL (ref 13.0–17.0)
Lymphocytes Relative: 15.6 % (ref 12.0–46.0)
Lymphs Abs: 0.9 K/uL (ref 0.7–4.0)
MCHC: 32.5 g/dL (ref 30.0–36.0)
MCV: 91.4 fl (ref 78.0–100.0)
Monocytes Absolute: 0.4 K/uL (ref 0.1–1.0)
Monocytes Relative: 6.3 % (ref 3.0–12.0)
Neutro Abs: 4.6 K/uL (ref 1.4–7.7)
Neutrophils Relative %: 75.1 % (ref 43.0–77.0)
Platelets: 225 K/uL (ref 150.0–400.0)
RBC: 4.98 Mil/uL (ref 4.22–5.81)
RDW: 18.5 % — ABNORMAL HIGH (ref 11.5–15.5)
WBC: 6.1 K/uL (ref 4.0–10.5)

## 2024-05-30 LAB — COMPREHENSIVE METABOLIC PANEL WITH GFR
ALT: 15 U/L (ref 0–53)
AST: 16 U/L (ref 0–37)
Albumin: 4.5 g/dL (ref 3.5–5.2)
Alkaline Phosphatase: 114 U/L (ref 39–117)
BUN: 20 mg/dL (ref 6–23)
CO2: 29 meq/L (ref 19–32)
Calcium: 10.6 mg/dL — ABNORMAL HIGH (ref 8.4–10.5)
Chloride: 104 meq/L (ref 96–112)
Creatinine, Ser: 0.96 mg/dL (ref 0.40–1.50)
GFR: 67.94 mL/min (ref >60.00)
Glucose, Bld: 113 mg/dL — ABNORMAL HIGH (ref 70–99)
Potassium: 4.7 meq/L (ref 3.5–5.1)
Sodium: 139 meq/L (ref 135–145)
Total Bilirubin: 0.6 mg/dL (ref 0.2–1.2)
Total Protein: 7.3 g/dL (ref 6.0–8.3)

## 2024-05-30 LAB — LIPID PANEL
Cholesterol: 125 mg/dL (ref 0–200)
HDL: 41.1 mg/dL (ref >39.00)
LDL Cholesterol: 60 mg/dL (ref 0–99)
NonHDL: 83.73
Total CHOL/HDL Ratio: 3
Triglycerides: 121 mg/dL (ref 0.0–149.0)
VLDL: 24.2 mg/dL (ref 0.0–40.0)

## 2024-05-30 LAB — HEMOGLOBIN A1C: Hgb A1c MFr Bld: 6.7 % — ABNORMAL HIGH (ref 4.6–6.5)
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
## 2024-05-30 NOTE — Progress Notes (Signed)
 Phone [phone] In person visit   Subjective:   Sean Lane is a 89 y.o. year old very pleasant male patient who presents for/with See problem oriented charting Chief Complaint  Patient presents with   Memory Loss   Medical Management of Chronic Issues    Would like to discuss options for pain mgmt from falls back in may;    Past Medical History-  Patient Active Problem List   Diagnosis Date Noted   Memory change 11/09/2022    Priority: High   CAD S/P percutaneous coronary angioplasty 10/26/2016    Priority: High   Type 2 diabetes mellitus with CAD(HCC) 12/14/2012    Priority: High   Atrial flutter (HCC) 10/03/2011    Priority: High   CAD (coronary artery disease) 12/17/2007    Priority: High   Insomnia 02/05/2024    Priority: Medium    SINUS BRADYCARDIA 07/20/2010    Priority: Medium    BPH associated with nocturia 06/08/2009    Priority: Medium    DIZZINESS 12/15/2008    Priority: Medium    Hyperlipidemia 04/02/2007    Priority: Medium    Essential hypertension 04/02/2007    Priority: Medium    Diverticulitis of colon 11/22/2015    Priority: Low   Nephrolithiasis 05/07/2014    Priority: Low   Gout attack 12/14/2012    Priority: Low   Hematuria 03/28/2012    Priority: Low   RBBB 12/28/2008    Priority: Low   GERD 12/15/2008    Priority: Low   LOSS, CONDUCTIVE HEARING, COMBINED TYPE 04/16/2007    Priority: Low   Allergic rhinitis 04/02/2007    Priority: Low   Arthritis of left sacroiliac joint (HCC) 04/19/2024    Priority: 1.   Femur fracture (HCC) 01/19/2024    Priority: 1.   Fracture of femoral neck, right, closed (HCC) 01/19/2024    Priority: 1.   Bilateral wrist pain 05/18/2021    Priority: 1.   Arthritis of carpometacarpal [facility]) joint of both thumbs 05/03/2021    Priority: 1.   Left rotator cuff tear 12/09/2020    Priority: 1.   Degenerative disc disease, lumbar 07/21/2020    Priority: 1.   Nonallopathic lesion of sacral region  12/17/2019    Priority: 1.   Nonallopathic lesion of lumbosacral region 12/17/2019    Priority: 1.   Spasm of left piriformis muscle 06/27/2019    Priority: 1.   Degenerative disc disease, cervical 05/28/2019    Priority: 1.   Nonallopathic lesion of thoracic region 05/28/2019    Priority: 1.   Delirium due to general medical condition 02/05/2024   Sensorineural hearing loss (SNHL) of both ears 12/01/2022    Medications- reviewed and updated Current Outpatient Medications  Medication Sig Dispense Refill   aspirin  EC 81 MG tablet Take 81 mg by mouth daily.     Cholecalciferol  (VITAMIN D -3 PO) Take 1 capsule by mouth daily with supper.     Cyanocobalamin  (VITAMIN B-12 PO) Take 1 tablet by mouth daily.     DULoxetine  (CYMBALTA ) 20 MG capsule TAKE 1 CAPSULE DAILY 90 capsule 1   rosuvastatin  (CRESTOR ) 10 MG tablet TAKE 1 TABLET DAILY 90 tablet 3   No current facility-administered medications for this visit.     Objective:  BP 122/62 (BP Location: Left Arm, Patient Position: Sitting, Cuff Size: Normal)   Pulse 79   Temp 97.9 F (36.6 C) (Temporal)   Ht 6' 2 (1.88 m)   Wt 161 lb 9.6 oz (  73.3 kg)   SpO2 96%   BMI 20.75 kg/m  Gen: NAD, resting comfortably CV: RRR no murmurs rubs or gallops Lungs: CTAB no crackles, wheeze, rhonchi Ext: minimal edema Skin: warm, dry No midline lower back pain    Assessment and Plan   #Chronic pain- after prior right hip fracture, lumbar compression fracture- ongoing hip, low back pain and some radiation into right leg - tylenol  only somewhat helpful -still on Cymbalta  -pain most days around 5/10 and he has high pain threshold - Dr. Claudene mentioned low beam radiation vs naltrexone -he also has some old tramadol - he's going to try the old tramadol  maybe once a day- could even just do half tablet- if effective I can send in- otherwise we can trial the naltrexone  #Memory loss- somewhat better as pain has improved and getting further out from  medical interactions earlier this year- offered neurology referral but he knows I'm always open if he changes his mind  #CAD #hyperlipidemia S: Medication:rosuvastatin  10 mg and aspirin  81 mg -no chest pain or shortness of breath . Prior fatigue improved- hip is main issue along with back Lab Results  Component Value Date   CHOL 109 05/29/2023   HDL 41.60 05/29/2023   LDLCALC 49 05/29/2023   LDLDIRECT 54.0 06/05/2017   TRIG 91.0 05/29/2023   CHOLHDL 3 05/29/2023   A/P: CAD asymptomatic continue current medications . Lipids at goal continue current medications    # Atrial flutter S: Rate controlled with no rx Anticoagulated with xarelto  in past- now off A/P: no anticoagulation with fall risk. rate controlled without medications- continue to monitor without medications  # Diabetes S: Medication:diet controlled  Lab Results  Component Value Date   HGBA1C 6.3 11/27/2023   HGBA1C 6.3 (H) 03/16/2023   HGBA1C 6.8 (H) 09/21/2022    A/P: hopefully stable- update a1c today. Continue without meds for now   #released from urology- Dr. Cam said no further needs  Recommended follow up: Return in about 4 months (around 09/29/2024) for followup or sooner if needed.Schedule b4 you leave. Future Appointments  Date Time Provider Department Center  06/11/2024  4:00 PM Delford Maude BROCKS, MD [facility]  06/18/2024  2:45 PM Claudene Arthea HERO, DO [facility] None  06/24/2024  3:00 PM Loreda Hacker, DPM [facility]    Lab/Order associations: NOT fasting   ICD-10-CM   1. Coronary artery disease involving native coronary artery of native heart without angina pectoris  I25.10     2. Atrial flutter, unspecified type (HCC)  I48.92     3. Type 2 diabetes mellitus with other circulatory complication, without long-term current use of insulin  (HCC)  E11.59 Comprehensive metabolic panel with GFR    CBC with Differential/Platelet    Lipid panel    Hemoglobin A1c    4. Hyperlipidemia,  unspecified hyperlipidemia type  E78.5 Comprehensive metabolic panel with GFR    CBC with Differential/Platelet    Lipid panel    5. Essential hypertension  I10       No orders of the defined types were placed in this encounter.   Return precautions advised.  Garnette Lukes, MD

## 2024-05-30 NOTE — Patient Instructions (Addendum)
 Please stop by lab before you go If you have mychart- we will send your results within 3 business days of us  receiving them.  If you do not have mychart- we will call you about results within 5 business days of us  receiving them.  *please also note that you will see labs on mychart as soon as they post. I will later go in and write notes on them- will say notes from Dr. Katrinka   -he also has some old tramadol - he's going to try the old tramadol  maybe once a day- could even just do half tablet- if effective I can send in- otherwise we can trial the naltrexone. You don't need another vist for that  Recommended follow up: Return in about 4 months (around 09/29/2024) for followup or sooner if needed.Schedule b4 you leave.

## 2024-06-11 ENCOUNTER — Ambulatory Visit: Attending: Internal Medicine | Admitting: Cardiovascular Disease

## 2024-06-11 ENCOUNTER — Encounter: Payer: Self-pay | Admitting: Cardiovascular Disease

## 2024-06-11 VITALS — BP 150/62 | HR 86 | Ht 73.0 in | Wt 152.0 lb

## 2024-06-11 DIAGNOSIS — I2510 Atherosclerotic heart disease of native coronary artery without angina pectoris: Secondary | ICD-10-CM | POA: Diagnosis not present

## 2024-06-11 DIAGNOSIS — I10 Essential (primary) hypertension: Secondary | ICD-10-CM | POA: Insufficient documentation

## 2024-06-11 DIAGNOSIS — I480 Paroxysmal atrial fibrillation: Secondary | ICD-10-CM | POA: Insufficient documentation

## 2024-06-11 DIAGNOSIS — E785 Hyperlipidemia, unspecified: Secondary | ICD-10-CM | POA: Insufficient documentation

## 2024-06-11 NOTE — Patient Instructions (Signed)
 Medication Instructions:  Your physician recommends that you continue on your current medications as directed. Please refer to the Current Medication list given to you today.  *If you need a refill on your cardiac medications before your next appointment, please call your pharmacy*  Follow-Up: At [facility], you and your health needs are our priority.  As part of our continuing mission to provide you with exceptional heart care, our providers are all part of one team.  This team includes your primary Cardiologist (physician) and Advanced Practice Providers or APPs (Physician Assistants and Nurse Practitioners) who all work together to provide you with the care you need, when you need it.  Your next appointment:   6 month(s)  Provider:   Janelle Mediate, MD  We recommend signing up for the patient portal called "MyChart".  Sign up information is provided on this After Visit Summary.  MyChart is used to connect with patients for Virtual Visits (Telemedicine).  Patients are able to view lab/test results, encounter notes, upcoming appointments, etc.  Non-urgent messages can be sent to your provider as well.   To learn more about what you can do with MyChart, go to [email or web address].

## 2024-06-16 LAB — OPHTHALMOLOGY REPORT-SCANNED
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
## 2024-06-18 ENCOUNTER — Ambulatory Visit (INDEPENDENT_AMBULATORY_CARE_PROVIDER_SITE_OTHER): Admitting: Family Medicine

## 2024-06-18 VITALS — BP 114/76 | HR 61 | Ht 73.0 in | Wt 153.0 lb

## 2024-06-18 DIAGNOSIS — M545 Low back pain, unspecified: Secondary | ICD-10-CM

## 2024-06-18 DIAGNOSIS — M51362 Other intervertebral disc degeneration, lumbar region with discogenic back pain and lower extremity pain: Secondary | ICD-10-CM

## 2024-06-18 NOTE — Assessment & Plan Note (Signed)
 Still having pain that is out of proportion to the amount of palpation as well as with patient and now affecting activities on a more regular basis.  I do think that a CT scan CT myelogram actually would be beneficial to rule out any nerve impingement as well as the possibility of any occult fracture.  Discussed the potential for an MRI but patient is claustrophobic and would rather do this.  With this looking for occult fracture it may be better imaging anyhow.  Patient's pain is affecting daily activities on a much more regular basis.  Will follow-up again in 6 to 8 weeks.

## 2024-06-18 NOTE — Patient Instructions (Addendum)
 CT myelogram lumbar We will be in touch

## 2024-06-24 ENCOUNTER — Ambulatory Visit: Admitting: Podiatry

## 2024-06-24 ENCOUNTER — Encounter: Payer: Self-pay | Admitting: Podiatry

## 2024-06-24 ENCOUNTER — Other Ambulatory Visit: Payer: Self-pay

## 2024-06-24 DIAGNOSIS — M545 Low back pain, unspecified: Secondary | ICD-10-CM

## 2024-06-24 DIAGNOSIS — M79675 Pain in left toe(s): Secondary | ICD-10-CM

## 2024-06-24 DIAGNOSIS — E1151 Type 2 diabetes mellitus with diabetic peripheral angiopathy without gangrene: Secondary | ICD-10-CM

## 2024-06-24 DIAGNOSIS — B351 Tinea unguium: Secondary | ICD-10-CM | POA: Diagnosis not present

## 2024-06-24 DIAGNOSIS — M79674 Pain in right toe(s): Secondary | ICD-10-CM

## 2024-06-24 NOTE — Progress Notes (Signed)
 This patient returns to my office for at risk foot care.  This patient requires this care by a professional since this patient will be at risk due to having type 2 diabetes and coagulation defect.  Patient is taking xarelto .  This patient presents to the office with his wife.  This patient is unable to cut nails himself since the patient cannot reach his nails.These nails are painful walking and wearing shoes.  This patient presents for at risk foot care today.  General Appearance  Alert, conversant and in no acute stress.  Vascular  Dorsalis pedis   pulses are  weakly palpable  bilaterally. Posterior tibial pulses are absent  Bilaterally. Capillary return is within normal limits  bilaterally. Temperature is within normal limits  bilaterally.  Neurologic  Senn-Weinstein monofilament wire test diminished   bilaterally. Muscle power within normal limits bilaterally.  Nails Thick disfigured discolored nails with subungual debris  from hallux to fifth toes bilaterally. Hammer toes 2-4  B/L.  Orthopedic  No limitations of motion  feet .  No crepitus or effusions noted.  No bony pathology or digital deformities noted.  Skin  normotropic skin with no porokeratosis noted bilaterally.  No signs of infections or ulcers noted.     Onychomycosis  Pain in right toes  Pain in left toes  Consent was obtained for treatment procedures.   Mechanical debridement of nails 1-5  bilaterally performed with a nail nipper.  Filed with dremel without incident.    Return office visit   12  weeks                 Told patient to return for periodic foot care and evaluation due to potential at risk complications.   Cordella Bold DPM

## 2024-06-26 ENCOUNTER — Encounter: Payer: Self-pay | Admitting: Family Medicine

## 2024-06-26 ENCOUNTER — Ambulatory Visit (INDEPENDENT_AMBULATORY_CARE_PROVIDER_SITE_OTHER): Admitting: Family Medicine

## 2024-06-26 VITALS — BP 108/62 | HR 61 | Temp 96.9°F | Ht 73.0 in | Wt 166.4 lb

## 2024-06-26 DIAGNOSIS — F339 Major depressive disorder, recurrent, unspecified: Secondary | ICD-10-CM

## 2024-06-26 MED ORDER — DULOXETINE HCL 30 MG PO CPEP: 30.0000 mg | ORAL_CAPSULE | Freq: Every day | ORAL | 1 refills | Status: DC
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
## 2024-06-26 NOTE — Patient Instructions (Addendum)
 depression worsening- we opted to increase the Cymbalta  to 30 mg and schedule close follow up in 6 weeks .   Recommended follow up: Return in about 6 weeks (around 08/07/2024) for followup or sooner if needed.Schedule b4 you leave.  Taking the medicine as directed and not missing any doses is one of the best things you can do to treat your depression.  Here are some things to keep in mind:  Side effects (stomach upset, some increased anxiety) may happen before you notice a benefit.  These side effects typically go away over time. Changes to your dose of medicine or a change in medication all together is sometimes necessary Many people will notice an improvement within two weeks but the full effect of the medication can take up to 6-8 weeks Stopping the medication when you start feeling better often results in a return of symptoms If you start having thoughts of hurting yourself or others after starting this medicine, call our office immediately at [phone] or seek care through 911.

## 2024-07-01 ENCOUNTER — Telehealth: Payer: Self-pay

## 2024-07-01 NOTE — Progress Notes (Signed)
 See telephone note

## 2024-07-01 NOTE — Discharge Instructions (Signed)

## 2024-07-02 ENCOUNTER — Ambulatory Visit
Admission: RE | Admit: 2024-07-02 | Discharge: 2024-07-02 | Disposition: A | Discharge status: Home / Self Care | Source: Ambulatory Visit | Attending: Family Medicine | Admitting: Family Medicine

## 2024-07-02 DIAGNOSIS — M545 Low back pain, unspecified: Secondary | ICD-10-CM

## 2024-07-02 MED ORDER — IOPAMIDOL (ISOVUE-M 200) INJECTION 41%
20.0000 mL | Freq: Once | INTRAMUSCULAR | Status: AC
  Administered 2024-07-02: 20 mL via INTRATHECAL

## 2024-07-02 MED ORDER — DIAZEPAM 5 MG PO TABS
5.0000 mg | ORAL_TABLET | Freq: Once | ORAL | Status: AC
  Administered 2024-07-02: 5 mg via ORAL

## 2024-07-02 MED ORDER — MEPERIDINE HCL 50 MG/ML IJ SOLN: 50.0000 mg | Freq: Once | INTRAMUSCULAR | Status: DC | PRN

## 2024-07-02 MED ORDER — ONDANSETRON HCL 4 MG/2ML IJ SOLN: 4.0000 mg | Freq: Once | INTRAMUSCULAR | Status: DC | PRN

## 2024-07-03 ENCOUNTER — Ambulatory Visit: Payer: Self-pay | Admitting: Family Medicine

## 2024-07-03 ENCOUNTER — Encounter: Payer: Self-pay | Admitting: Family Medicine

## 2024-07-03 ENCOUNTER — Telehealth: Payer: Self-pay | Admitting: Family Medicine

## 2024-07-03 NOTE — Telephone Encounter (Signed)
 I sent a MyChart message but please make sure patient/family gets that

## 2024-07-03 NOTE — Telephone Encounter (Signed)
 Please review CT L Spine and communicate thoughts with wife.

## 2024-07-08 ENCOUNTER — Encounter: Payer: Self-pay | Admitting: Family Medicine

## 2024-07-08 ENCOUNTER — Telehealth: Payer: Self-pay

## 2024-07-08 MED ORDER — HYDROCODONE-ACETAMINOPHEN 5-325 MG PO TABS: 1.0000 | ORAL_TABLET | Freq: Four times a day (QID) | ORAL | 0 refills | Status: AC | PRN

## 2024-07-08 NOTE — Progress Notes (Signed)
 See telephone note

## 2024-07-18 ENCOUNTER — Other Ambulatory Visit: Payer: Self-pay | Admitting: Family Medicine

## 2024-07-21 ENCOUNTER — Encounter: Payer: Self-pay | Admitting: Family Medicine

## 2024-07-25 DIAGNOSIS — M545 Low back pain, unspecified: Secondary | ICD-10-CM | POA: Diagnosis not present

## 2024-08-12 ENCOUNTER — Ambulatory Visit: Admitting: Family Medicine

## 2024-08-12 ENCOUNTER — Encounter: Payer: Self-pay | Admitting: Family Medicine

## 2024-08-12 VITALS — BP 122/60 | HR 63 | Temp 98.6°F | Ht 73.0 in | Wt 171.8 lb

## 2024-08-12 DIAGNOSIS — F339 Major depressive disorder, recurrent, unspecified: Secondary | ICD-10-CM

## 2024-08-12 DIAGNOSIS — B353 Tinea pedis: Secondary | ICD-10-CM | POA: Diagnosis not present

## 2024-08-12 MED ORDER — DULOXETINE HCL 60 MG PO CPEP: 60.0000 mg | ORAL_CAPSULE | Freq: Every day | ORAL | 3 refills | Status: DC

## 2024-08-12 NOTE — Patient Instructions (Addendum)
  Poor control of depression and anxiety still but improving- we opted to increase Cymbalta  to 60 mg and reevaluate in another 6-8 weeks  Trial tinactin over the counter for the feet for at least 2-3 weeks twice daily as long as not worsening- if worsens stop and follow up with Dr. Joshua due to allergy to ketoconazole   Recommended follow up: Return in about 6 weeks (around 09/23/2024) for followup or sooner if needed.Schedule b4 you leave.
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
## 2024-08-13 ENCOUNTER — Encounter: Payer: Self-pay | Admitting: Family Medicine

## 2024-08-13 DIAGNOSIS — Z23 Encounter for immunization: Secondary | ICD-10-CM | POA: Diagnosis not present

## 2024-08-14 DIAGNOSIS — M545 Low back pain, unspecified: Secondary | ICD-10-CM | POA: Diagnosis not present

## 2024-08-27 ENCOUNTER — Encounter: Payer: Self-pay | Admitting: Podiatry

## 2024-08-27 ENCOUNTER — Ambulatory Visit: Admitting: Podiatry

## 2024-08-27 DIAGNOSIS — B351 Tinea unguium: Secondary | ICD-10-CM | POA: Diagnosis not present

## 2024-08-27 DIAGNOSIS — E1151 Type 2 diabetes mellitus with diabetic peripheral angiopathy without gangrene: Secondary | ICD-10-CM | POA: Diagnosis not present

## 2024-08-27 DIAGNOSIS — M79675 Pain in left toe(s): Secondary | ICD-10-CM

## 2024-08-27 DIAGNOSIS — M79674 Pain in right toe(s): Secondary | ICD-10-CM | POA: Diagnosis not present

## 2024-08-27 NOTE — Progress Notes (Signed)
 This patient returns to my office for at risk foot care.  This patient requires this care by a professional since this patient will be at risk due to having type 2 diabetes and coagulation defect.  Patient is taking xarelto .  This patient presents to the office with his wife.  This patient is unable to cut nails himself since the patient cannot reach his nails.These nails are painful walking and wearing shoes.  This patient presents for at risk foot care today.  General Appearance  Alert, conversant and in no acute stress.  Vascular  Dorsalis pedis   pulses are  weakly palpable  bilaterally. Posterior tibial pulses are absent  Bilaterally. Capillary return is within normal limits  bilaterally. Temperature is within normal limits  bilaterally.  Neurologic  Senn-Weinstein monofilament wire test diminished   bilaterally. Muscle power within normal limits bilaterally.  Nails Thick disfigured discolored nails with subungual debris  from hallux to fifth toes bilaterally. Hammer toes 2-4  B/L.  Orthopedic  No limitations of motion  feet .  No crepitus or effusions noted.  No bony pathology or digital deformities noted.  Skin  normotropic skin with no porokeratosis noted bilaterally.  No signs of infections or ulcers noted.     Onychomycosis  Pain in right toes  Pain in left toes  Consent was obtained for treatment procedures.   Mechanical debridement of nails 1-5  bilaterally performed with a nail nipper.  Filed with dremel without incident.    Return office visit   12  weeks                 Told patient to return for periodic foot care and evaluation due to potential at risk complications.   Cordella Bold DPM

## 2024-08-29 NOTE — Addendum Note (Signed)
 Encounter addended by: Isela Iha on: 08/29/2024 3:28 PM  Actions taken: Imaging Exam ended

## 2024-09-01 NOTE — Addendum Note (Signed)
 Encounter addended by: Isela Iha on: 09/01/2024 8:58 AM  Actions taken: Imaging Exam ended

## 2024-09-04 ENCOUNTER — Other Ambulatory Visit: Payer: Self-pay | Admitting: Family Medicine

## 2024-09-25 ENCOUNTER — Encounter: Payer: Self-pay | Admitting: Family Medicine

## 2024-09-25 ENCOUNTER — Ambulatory Visit: Payer: Self-pay | Admitting: Family Medicine

## 2024-09-25 ENCOUNTER — Ambulatory Visit: Admitting: Family Medicine

## 2024-09-25 VITALS — BP 136/72 | HR 72 | Temp 99.0°F | Ht 73.0 in | Wt 172.8 lb

## 2024-09-25 DIAGNOSIS — R413 Other amnesia: Secondary | ICD-10-CM

## 2024-09-25 DIAGNOSIS — Z9861 Coronary angioplasty status: Secondary | ICD-10-CM

## 2024-09-25 DIAGNOSIS — E119 Type 2 diabetes mellitus without complications: Secondary | ICD-10-CM | POA: Diagnosis not present

## 2024-09-25 DIAGNOSIS — I2510 Atherosclerotic heart disease of native coronary artery without angina pectoris: Secondary | ICD-10-CM | POA: Diagnosis not present

## 2024-09-25 DIAGNOSIS — E785 Hyperlipidemia, unspecified: Secondary | ICD-10-CM

## 2024-09-25 DIAGNOSIS — I4892 Unspecified atrial flutter: Secondary | ICD-10-CM

## 2024-09-25 DIAGNOSIS — E1159 Type 2 diabetes mellitus with other circulatory complications: Secondary | ICD-10-CM

## 2024-09-25 LAB — COMPREHENSIVE METABOLIC PANEL WITH GFR
ALT: 9 U/L (ref 3–53)
AST: 12 U/L (ref 5–37)
Albumin: 4.4 g/dL (ref 3.5–5.2)
Alkaline Phosphatase: 111 U/L (ref 39–117)
BUN: 22 mg/dL (ref 6–23)
CO2: 30 meq/L (ref 19–32)
Calcium: 10.2 mg/dL (ref 8.4–10.5)
Chloride: 103 meq/L (ref 96–112)
Creatinine, Ser: 1.05 mg/dL (ref 0.40–1.50)
GFR: 60.87 mL/min
Glucose, Bld: 118 mg/dL — ABNORMAL HIGH (ref 70–99)
Potassium: 4.3 meq/L (ref 3.5–5.1)
Sodium: 137 meq/L (ref 135–145)
Total Bilirubin: 0.5 mg/dL (ref 0.2–1.2)
Total Protein: 7.2 g/dL (ref 6.0–8.3)

## 2024-09-25 LAB — HEMOGLOBIN A1C: Hgb A1c MFr Bld: 6.4 % (ref 4.6–6.5)

## 2024-09-25 NOTE — Patient Instructions (Addendum)
 Congrats on progress! Let me know if anything changes  Call [facility] back- we have new referral  in today   Please stop by lab before you go If you have mychart- we will send your results within 3 business days of us  receiving them.  If you do not have mychart- we will call you about results within 5 business days of us  receiving them.  *please also note that you will see labs on mychart as soon as they post. I will later go in and write notes on them- will say notes from Dr. Katrinka   Recommended follow up: Return in about 4 months (around 01/23/2025) for followup or sooner if needed.Schedule b4 you leave. Cancel visit for the 20th at the dek

## 2024-09-25 NOTE — Progress Notes (Signed)
 " Phone [phone] In person visit   Subjective:   Sean Lane is a 89 y.o. year old very pleasant male patient who presents for/with See problem oriented charting Chief Complaint  Patient presents with   Follow-up    Here for a follow-up for his toes. Doing a lot better. A spot on the right leg is still really itchy.    Referral    Also needs another referral to Neuro. Was unable to make the appointment and it expired after 90 days.     Past Medical History-  Patient Active Problem List   Diagnosis Date Noted   Depression, recurrent 06/26/2024    Priority: High   Memory change 11/09/2022    Priority: High   CAD S/P percutaneous coronary angioplasty 10/26/2016    Priority: High   Type 2 diabetes mellitus with CAD(HCC) 12/14/2012    Priority: High   Atrial flutter (HCC) 10/03/2011    Priority: High   CAD (coronary artery disease) 12/17/2007    Priority: High   Insomnia 02/05/2024    Priority: Medium    Sensorineural hearing loss (SNHL) of both ears 12/01/2022    Priority: Medium    SINUS BRADYCARDIA 07/20/2010    Priority: Medium    BPH associated with nocturia 06/08/2009    Priority: Medium    DIZZINESS 12/15/2008    Priority: Medium    Hyperlipidemia 04/02/2007    Priority: Medium    Essential hypertension 04/02/2007    Priority: Medium    Diverticulitis of colon 11/22/2015    Priority: Low   Nephrolithiasis 05/07/2014    Priority: Low   Gout attack 12/14/2012    Priority: Low   Hematuria 03/28/2012    Priority: Low   RBBB 12/28/2008    Priority: Low   GERD 12/15/2008    Priority: Low   LOSS, CONDUCTIVE HEARING, COMBINED TYPE 04/16/2007    Priority: Low   Allergic rhinitis 04/02/2007    Priority: Low   Arthritis of left sacroiliac joint 04/19/2024    Priority: 1.   Femur fracture (HCC) 01/19/2024    Priority: 1.   Fracture of femoral neck, right, closed (HCC) 01/19/2024    Priority: 1.   Bilateral wrist pain 05/18/2021    Priority: 1.    Arthritis of carpometacarpal [facility]) joint of both thumbs 05/03/2021    Priority: 1.   Left rotator cuff tear 12/09/2020    Priority: 1.   Degenerative disc disease, lumbar 07/21/2020    Priority: 1.   Nonallopathic lesion of sacral region 12/17/2019    Priority: 1.   Nonallopathic lesion of lumbosacral region 12/17/2019    Priority: 1.   Spasm of left piriformis muscle 06/27/2019    Priority: 1.   Degenerative disc disease, cervical 05/28/2019    Priority: 1.   Nonallopathic lesion of thoracic region 05/28/2019    Priority: 1.    Medications- reviewed and updated Current Outpatient Medications  Medication Sig Dispense Refill   acetaminophen  (TYLENOL  8 HOUR) 650 MG CR tablet      aspirin  EC 81 MG tablet Take 81 mg by mouth daily.     Cholecalciferol  (VITAMIN D -3 PO) Take 1 capsule by mouth daily with supper.     Cyanocobalamin  (VITAMIN B-12 PO) Take 1 tablet by mouth daily.     DULoxetine  (CYMBALTA ) 60 MG capsule TAKE 1 CAPSULE BY MOUTH EVERY DAY 90 capsule 2   rosuvastatin  (CRESTOR ) 10 MG tablet TAKE 1 TABLET DAILY 90 tablet 3   No current  facility-administered medications for this visit.     Objective:  BP 136/72 (BP Location: Left Arm, Patient Position: Sitting, Cuff Size: Normal)   Pulse 72   Temp 99 F (37.2 C) (Temporal)   Ht 6' 1 (1.854 m)   Wt 172 lb 12.8 oz (78.4 kg)   SpO2 95%   BMI 22.80 kg/m  Gen: NAD, resting comfortably CV: RRR no murmurs rubs or gallops Lungs: CTAB no crackles, wheeze, rhonchi Ext: no edema Skin: warm, dry Neuro: walks with cane    Assessment and Plan    #memory change needs new referral for neurology- old one expired  # Tinea pedis of the feet S: Bilateral foot rash noted last visit.  He was going to see dermatology in January but we wanted to be proactive-appeared to be moccasin type tinea pedis.  Recommended Tinactin for good 2 to 3-week trial at least.  Has allergy to ketoconazole  so we avoided that. A/P: appears to have  healed- can reuse if needed for recurrence     # Back and hip pain S: Fractures in May with ongoing hip and back pain worse with weather changes.  Plan to see pain specialist at [facility]  after physical therapy but has started PT  -doing physical therapy- gradual mild improvements perhaps A/P: ongoing back and hip pain- continue therapy and follow up with orthopedics    # Depression S: Medication: Cymbalta  30 mg at last visit and with poor control we increased to 60 mg  A/P: PHQ9 improved substantially  to 7 down from 11 and rated as somewhat instead of very difficult. GAD7 down from 15 to 1 and rated only somewhat difficult. Drastic improvement overall . With improvement hed like to hold steady and continue to monitor- offered medicine change or therapy- declines  # CAD #hyperlipidemia S: Medication:Aspirin  81 mg, rosuvastatin  10 mg daily -No chest pain or shortness of breath reported  Lab Results  Component Value Date   CHOL 125 05/30/2024   HDL 41.10 05/30/2024   LDLCALC 60 05/30/2024   LDLDIRECT 54.0 06/05/2017   TRIG 121.0 05/30/2024   CHOLHDL 3 05/30/2024   A/P: Lipids reasonably well-controlled with LDL under 70-continue current medication.  #atrial flutter with prior spontaenous conversion to sinus rhythm on xarelto  in past but ultimately held with fall risk and later hematuria- remain off. No obvious return to a flutter thankfully  # Diabetes S: Medication:none  Lab Results  Component Value Date   HGBA1C 6.7 (H) 05/30/2024   HGBA1C 6.3 11/27/2023   HGBA1C 6.3 (H) 03/16/2023     A/P: diet controlled typically- update a1c with labs today- remain off medications for now      Recommended follow up: Return in about 4 months (around 01/23/2025) for followup or sooner if needed.Schedule b4 you leave. Future Appointments  Date Time Provider Department Center  09/30/2024  2:00 PM Katrinka Garnette KIDD, MD [facility]  11/25/2024  3:15 PM Loreda Hacker, DPM [facility]    Lab/Order associations:   ICD-10-CM   1. Memory change  R41.3 Ambulatory referral to Neurology    2. Atrial flutter, unspecified type (HCC)  I48.92       No orders of the defined types were placed in this encounter.   Return precautions advised.  Garnette Katrinka, MD  "

## 2024-09-30 ENCOUNTER — Ambulatory Visit: Admitting: Family Medicine

## 2024-10-01 ENCOUNTER — Other Ambulatory Visit: Payer: Self-pay

## 2024-10-01 ENCOUNTER — Encounter: Payer: Self-pay | Admitting: Family Medicine

## 2024-10-01 MED ORDER — HYDROCODONE-ACETAMINOPHEN 5-325 MG PO TABS: ORAL_TABLET | ORAL | 0 refills | Status: AC

## 2024-10-01 NOTE — Telephone Encounter (Signed)
 Pended and sent to you for refill.

## 2024-10-07 ENCOUNTER — Encounter: Payer: Self-pay | Admitting: Family Medicine

## 2024-11-25 ENCOUNTER — Ambulatory Visit: Admitting: Podiatry

## 2025-01-23 ENCOUNTER — Ambulatory Visit: Admitting: Family Medicine
